# Patient Record
Sex: Male | Born: 1980 | Race: Black or African American | Hispanic: No | Smoking: Current some day smoker
Health system: Southern US, Community
[De-identification: ages and names within clinical notes are randomized; demographics above are authoritative.]

## PROBLEM LIST (undated history)

## (undated) DIAGNOSIS — F1910 Other psychoactive substance abuse, uncomplicated: Secondary | ICD-10-CM

## (undated) DIAGNOSIS — I5020 Unspecified systolic (congestive) heart failure: Secondary | ICD-10-CM

## (undated) DIAGNOSIS — I2699 Other pulmonary embolism without acute cor pulmonale: Secondary | ICD-10-CM

## (undated) DIAGNOSIS — I509 Heart failure, unspecified: Secondary | ICD-10-CM

## (undated) DIAGNOSIS — B20 Human immunodeficiency virus [HIV] disease: Secondary | ICD-10-CM

## (undated) DIAGNOSIS — I10 Essential (primary) hypertension: Secondary | ICD-10-CM

## (undated) DIAGNOSIS — Z21 Asymptomatic human immunodeficiency virus [HIV] infection status: Secondary | ICD-10-CM

## (undated) HISTORY — PX: HERNIA REPAIR: SHX51

---

## 2018-09-13 DIAGNOSIS — I420 Dilated cardiomyopathy: Secondary | ICD-10-CM | POA: Insufficient documentation

## 2018-09-16 DIAGNOSIS — Z87891 Personal history of nicotine dependence: Secondary | ICD-10-CM | POA: Insufficient documentation

## 2018-09-17 DIAGNOSIS — B360 Pityriasis versicolor: Secondary | ICD-10-CM | POA: Insufficient documentation

## 2018-11-04 DIAGNOSIS — W34010A Accidental discharge of airgun, initial encounter: Secondary | ICD-10-CM | POA: Insufficient documentation

## 2018-11-30 DIAGNOSIS — B009 Herpesviral infection, unspecified: Secondary | ICD-10-CM | POA: Insufficient documentation

## 2019-01-04 DIAGNOSIS — R0789 Other chest pain: Secondary | ICD-10-CM | POA: Insufficient documentation

## 2019-06-20 ENCOUNTER — Emergency Department: Admission: EM | Admit: 2019-06-20 | Discharge: 2019-06-20 | Discharge status: Against Medical Advice | Payer: Self-pay

## 2020-02-29 ENCOUNTER — Other Ambulatory Visit: Payer: Self-pay

## 2020-02-29 ENCOUNTER — Emergency Department
Admission: EM | Admit: 2020-02-29 | Discharge: 2020-03-01 | Disposition: A | Discharge status: Home / Self Care | Payer: Medicaid Other | Attending: Student in an Organized Health Care Education/Training Program | Admitting: Student in an Organized Health Care Education/Training Program

## 2020-02-29 ENCOUNTER — Encounter: Payer: Self-pay | Admitting: Emergency Medicine

## 2020-02-29 DIAGNOSIS — R600 Localized edema: Secondary | ICD-10-CM | POA: Diagnosis present

## 2020-02-29 DIAGNOSIS — T783XXA Angioneurotic edema, initial encounter: Secondary | ICD-10-CM

## 2020-02-29 DIAGNOSIS — I10 Essential (primary) hypertension: Secondary | ICD-10-CM | POA: Diagnosis not present

## 2020-02-29 HISTORY — DX: Essential (primary) hypertension: I10

## 2020-02-29 MED ORDER — PREDNISONE 20 MG PO TABS: 40.0000 mg | ORAL_TABLET | Freq: Every day | ORAL | 0 refills | Status: AC

## 2020-02-29 MED ORDER — DIPHENHYDRAMINE HCL 50 MG/ML IJ SOLN
25.0000 mg | Freq: Once | INTRAMUSCULAR | Status: AC
  Administered 2020-02-29: 25 mg via INTRAVENOUS
  Filled 2020-02-29: qty 1

## 2020-02-29 MED ORDER — AMLODIPINE BESYLATE 5 MG PO TABS
5.0000 mg | ORAL_TABLET | Freq: Every day | ORAL | 0 refills | Status: DC
Start: 2020-02-29 — End: 2021-07-24

## 2020-02-29 MED ORDER — FAMOTIDINE IN NACL 20-0.9 MG/50ML-% IV SOLN
20.0000 mg | Freq: Once | INTRAVENOUS | Status: AC
  Administered 2020-02-29: 20 mg via INTRAVENOUS
  Filled 2020-02-29: qty 50

## 2020-02-29 MED ORDER — METHYLPREDNISOLONE SODIUM SUCC 125 MG IJ SOLR
125.0000 mg | Freq: Once | INTRAMUSCULAR | Status: AC
  Administered 2020-02-29: 125 mg via INTRAVENOUS
  Filled 2020-02-29: qty 2

## 2020-02-29 MED ORDER — EPINEPHRINE 0.3 MG/0.3ML IJ SOAJ
0.3000 mg | Freq: Once | INTRAMUSCULAR | Status: AC
  Administered 2020-02-29: 0.3 mg via INTRAMUSCULAR
  Filled 2020-02-29: qty 0.3

## 2020-02-29 NOTE — ED Triage Notes (Signed)
Patient ambulatory to triage with steady gait, without difficulty or distress noted, mask in place; pt reports swelling to upper lip; has been on lisinopril for HTN for last yr; denies rash/itching, no diff swallowing or breathing

## 2020-02-29 NOTE — ED Provider Notes (Signed)
[facility] Emergency Department Provider Note    First MD Initiated Contact with Patient 02/29/20 2013     (approximate)  I have reviewed the triage vital signs and the nursing notes.   HISTORY  Chief Complaint Allergic Reaction    HPI William Powers is a 39 y.o. male with a history of hypertension and CHF on lisinopril presents to the ER for swelling of his upper lip.  States that roughly 1 week ago had an area about the size of mosquito bite on the upper lip this swelled up for several hours and then went away.  States around 3:00 today noted more significant swelling feels like his lip is very tight.  Denies any shortness of breath or chest pain.  No trouble swallowing.  No change in phonation.  Denies any sore throat.  Never had this happen before.  Has been on lisinopril for many years.    Past Medical History:  Diagnosis Date  . Hypertension    No family history on file. History reviewed. No pertinent surgical history. There are no problems to display for this patient.     Prior to Admission medications   Medication Sig Start Date End Date Taking? Authorizing Provider  amLODipine (NORVASC) 5 MG tablet Take 1 tablet (5 mg total) by mouth daily for 14 days. 02/29/20 03/14/20  Willy Eddy, MD    Allergies Patient has no known allergies.    Social History Social History   Tobacco Use  . Smoking status: Never Smoker  . Smokeless tobacco: Never Used  Substance Use Topics  . Alcohol use: Not on file  . Drug use: Not on file    Review of Systems Patient denies headaches, rhinorrhea, blurry vision, numbness, shortness of breath, chest pain, edema, cough, abdominal pain, nausea, vomiting, diarrhea, dysuria, fevers, rashes or hallucinations unless otherwise stated above in HPI. ____________________________________________   PHYSICAL EXAM:  VITAL SIGNS: Vitals:   02/29/20 2200 02/29/20 2230  BP: 115/76 106/78  Pulse: 74 76  Resp:  (!) 24 (!) 24  Temp:    SpO2: 97% 99%    Constitutional: Alert and oriented.  Eyes: Conjunctivae are normal.  Head: Atraumatic. Nose: No congestion/rhinnorhea. Mouth/Throat: Mucous membranes are moist.  Mild angioedema of the upper lip.  No lower or oropharyngeal involvement.  No uvular edema. Neck: No stridor. Painless ROM.  Cardiovascular: Normal rate, regular rhythm. Grossly normal heart sounds.  Good peripheral circulation. Respiratory: Normal respiratory effort.  No retractions. Lungs CTAB. Gastrointestinal: Soft and nontender. No distention. No abdominal bruits. No CVA tenderness. Genitourinary:  Musculoskeletal: No lower extremity tenderness nor edema.  No joint effusions. Neurologic:  Normal speech and language. No gross focal neurologic deficits are appreciated. No facial droop Skin:  Skin is warm, dry and intact. No rash noted. Psychiatric: Mood and affect are normal. Speech and behavior are normal.  ____________________________________________   LABS (all labs ordered are listed, but only abnormal results are displayed)  No results found for this or any previous visit (from the past 24 hour(s)). ____________________________________________ ____________________________________________  RADIOLOGY   ____________________________________________   PROCEDURES  Procedure(s) performed:  Procedures    Critical Care performed: no ____________________________________________   INITIAL IMPRESSION / ASSESSMENT AND PLAN / ED COURSE  Pertinent labs & imaging results that were available during my care of the patient were reviewed by me and considered in my medical decision making (see chart for details).   DDX: Angioedema, urticaria, anaphylaxis, cellulitis  Shae Augello is a 39 y.o.  who presents to the ED with evidence of mild angioedema to the upper lip.  No airway involvement.  Patient nontoxic-appearing.  Symptoms started around 3 days of does not seem to be rapidly  progressing.  Does use ace inhibitor which I informed him he will need to discontinue.  Will temporize and observe in the ER.  Clinical Course as of Feb 28 2337  Wed Feb 29, 2020  2132 Patient reassessed.  No progression of edema.   [PR]  2333 Patient reassessed.  His angioedema has improved.  Is much softer.  No spread.  No lingual or posterior oropharynx involvement.  Patient feels like it is also improving.  Given the duration of so I do not feel that he requires any additional observation or hospitalization he otherwise feels well.  Discussed signs and symptoms which he should return to the ER.  Have discussed with the patient and available family all diagnostics and treatments performed thus far and all questions were answered to the best of my ability. The patient demonstrates understanding and agreement with plan.    [PR]    Clinical Course User Index [PR] Merlyn Lot, MD    The patient was evaluated in Emergency Department today for the symptoms described in the history of present illness. He/she was evaluated in the context of the global COVID-19 pandemic, which necessitated consideration that the patient might be at risk for infection with the SARS-CoV-2 virus that causes COVID-19. Institutional protocols and algorithms that pertain to the evaluation of patients at risk for COVID-19 are in a state of rapid change based on information released by regulatory bodies including the CDC and federal and state organizations. These policies and algorithms were followed during the patient's care in the ED.  As part of my medical decision making, I reviewed the following data within the [place] notes reviewed and incorporated, Labs reviewed, notes from prior ED visits and [place] Controlled Substance Database   ____________________________________________   FINAL CLINICAL IMPRESSION(S) / ED DIAGNOSES  Final diagnoses:  Angioedema, initial encounter      NEW  MEDICATIONS STARTED DURING THIS VISIT:  New Prescriptions   AMLODIPINE (NORVASC) 5 MG TABLET    Take 1 tablet (5 mg total) by mouth daily for 14 days.     Note:  This document was prepared using Dragon voice recognition software and may include unintentional dictation errors.    Merlyn Lot, MD 02/29/20 [phone]

## 2020-02-29 NOTE — Discharge Instructions (Addendum)
Please discontinue use of lisinopril.  Call you PCP or cardiology office tomorrow to discuss medication management going forward for your blood pressure

## 2020-02-29 NOTE — ED Notes (Signed)
MD at bedside. 

## 2021-05-18 ENCOUNTER — Emergency Department (HOSPITAL_COMMUNITY)
Admission: EM | Admit: 2021-05-18 | Discharge: 2021-05-19 | Disposition: A | Discharge status: Home / Self Care | Payer: Medicaid Other | Attending: Emergency Medicine | Admitting: Emergency Medicine

## 2021-05-18 ENCOUNTER — Other Ambulatory Visit: Payer: Self-pay

## 2021-05-18 DIAGNOSIS — R079 Chest pain, unspecified: Secondary | ICD-10-CM | POA: Diagnosis not present

## 2021-05-18 DIAGNOSIS — I509 Heart failure, unspecified: Secondary | ICD-10-CM | POA: Diagnosis not present

## 2021-05-18 DIAGNOSIS — R635 Abnormal weight gain: Secondary | ICD-10-CM | POA: Insufficient documentation

## 2021-05-18 DIAGNOSIS — Z5321 Procedure and treatment not carried out due to patient leaving prior to being seen by health care provider: Secondary | ICD-10-CM | POA: Insufficient documentation

## 2021-05-18 DIAGNOSIS — R229 Localized swelling, mass and lump, unspecified: Secondary | ICD-10-CM | POA: Insufficient documentation

## 2021-05-18 LAB — CBC WITH DIFFERENTIAL/PLATELET
Abs Immature Granulocytes: 0.01 10*3/uL (ref 0.00–0.07)
Basophils Absolute: 0 10*3/uL (ref 0.0–0.1)
Basophils Relative: 1 %
Eosinophils Absolute: 0.2 10*3/uL (ref 0.0–0.5)
Eosinophils Relative: 4 %
HCT: 45.4 % (ref 39.0–52.0)
Hemoglobin: 15.3 g/dL (ref 13.0–17.0)
Immature Granulocytes: 0 %
Lymphocytes Relative: 44 %
Lymphs Abs: 2.7 10*3/uL (ref 0.7–4.0)
MCH: 31.9 pg (ref 26.0–34.0)
MCHC: 33.7 g/dL (ref 30.0–36.0)
MCV: 94.8 fL (ref 80.0–100.0)
Monocytes Absolute: 0.5 10*3/uL (ref 0.1–1.0)
Monocytes Relative: 8 %
Neutro Abs: 2.6 10*3/uL (ref 1.7–7.7)
Neutrophils Relative %: 43 %
Platelets: 317 10*3/uL (ref 150–400)
RBC: 4.79 MIL/uL (ref 4.22–5.81)
RDW: 13.9 % (ref 11.5–15.5)
WBC: 6.1 10*3/uL (ref 4.0–10.5)
nRBC: 0 % (ref 0.0–0.2)

## 2021-05-18 NOTE — ED Triage Notes (Signed)
BIB EMS  Pt has had chest pain today  Has been out of his lasix for a few days but got it filled today and took it.  Endorses weight gain and swelling  Hx of CHF.

## 2021-05-18 NOTE — ED Provider Notes (Signed)
Emergency Medicine Provider Triage Evaluation Note  William Powers , a 40 y.o. male  was evaluated in triage.  Pt complains of left sided chest pain that has been ongoing all day but worse this evening.  Reports some SOB-- hx of CHF and ran out of lasix recently.  Weight is up approx 7 lbs from baseline.  He did get lasix refilled today and took this afternoon. No prior hx of MI.  No recent URI symptoms, no fever.  Review of Systems  Positive: Chest pain, SOB Negative: Fever, cough,  Physical Exam  BP 117/73   Pulse 96   Temp 98.8 F (37.1 C) (Oral)   Resp 18   SpO2 99%   Gen:   Awake, no distress   Resp:  Normal effort  MSK:   Moves extremities without difficulty  Other:    Medical Decision Making  Medically screening exam initiated at 11:39 PM.  Appropriate orders placed.  William Powers was informed that the remainder of the evaluation will be completed by another provider, this initial triage assessment does not replace that evaluation, and the importance of remaining in the ED until their evaluation is complete.     William Hatchet, PA-C 05/18/21 2346    William Crease, MD 05/19/21 [phone]

## 2021-05-19 ENCOUNTER — Emergency Department (HOSPITAL_COMMUNITY): Payer: Medicaid Other

## 2021-05-19 LAB — BRAIN NATRIURETIC PEPTIDE: B Natriuretic Peptide: 8.3 pg/mL (ref 0.0–100.0)

## 2021-05-19 LAB — TROPONIN I (HIGH SENSITIVITY)
Troponin I (High Sensitivity): 4 ng/L (ref <18)
Troponin I (High Sensitivity): 5 ng/L (ref <18)

## 2021-05-19 LAB — BASIC METABOLIC PANEL
Anion gap: 9 (ref 5–15)
BUN: 21 mg/dL — ABNORMAL HIGH (ref 6–20)
CO2: 22 mmol/L (ref 22–32)
Calcium: 8.7 mg/dL — ABNORMAL LOW (ref 8.9–10.3)
Chloride: 106 mmol/L (ref 98–111)
Creatinine, Ser: 1.32 mg/dL — ABNORMAL HIGH (ref 0.61–1.24)
GFR, Estimated: >60 mL/min (ref >60)
Glucose, Bld: 93 mg/dL (ref 70–99)
Potassium: 3.4 mmol/L — ABNORMAL LOW (ref 3.5–5.1)
Sodium: 137 mmol/L (ref 135–145)

## 2021-05-19 NOTE — ED Notes (Signed)
Called pt x3 for vitals with no response. Pulling OTF at this time.

## 2021-06-07 ENCOUNTER — Emergency Department (HOSPITAL_COMMUNITY): Payer: Medicaid Other

## 2021-06-07 ENCOUNTER — Encounter (HOSPITAL_COMMUNITY): Payer: Self-pay | Admitting: Emergency Medicine

## 2021-06-07 ENCOUNTER — Other Ambulatory Visit: Payer: Self-pay

## 2021-06-07 ENCOUNTER — Emergency Department (HOSPITAL_COMMUNITY)
Admission: EM | Admit: 2021-06-07 | Discharge: 2021-06-07 | Disposition: A | Discharge status: Home / Self Care | Payer: Medicaid Other | Attending: Emergency Medicine | Admitting: Emergency Medicine

## 2021-06-07 DIAGNOSIS — I110 Hypertensive heart disease with heart failure: Secondary | ICD-10-CM | POA: Diagnosis not present

## 2021-06-07 DIAGNOSIS — I5023 Acute on chronic systolic (congestive) heart failure: Secondary | ICD-10-CM | POA: Insufficient documentation

## 2021-06-07 DIAGNOSIS — R0602 Shortness of breath: Secondary | ICD-10-CM | POA: Diagnosis present

## 2021-06-07 HISTORY — DX: Heart failure, unspecified: I50.9

## 2021-06-07 LAB — CBC WITH DIFFERENTIAL/PLATELET
Abs Immature Granulocytes: 0.03 10*3/uL (ref 0.00–0.07)
Basophils Absolute: 0 10*3/uL (ref 0.0–0.1)
Basophils Relative: 1 %
Eosinophils Absolute: 0.3 10*3/uL (ref 0.0–0.5)
Eosinophils Relative: 4 %
HCT: 45.2 % (ref 39.0–52.0)
Hemoglobin: 15.3 g/dL (ref 13.0–17.0)
Immature Granulocytes: 0 %
Lymphocytes Relative: 40 %
Lymphs Abs: 2.9 10*3/uL (ref 0.7–4.0)
MCH: 32.1 pg (ref 26.0–34.0)
MCHC: 33.8 g/dL (ref 30.0–36.0)
MCV: 95 fL (ref 80.0–100.0)
Monocytes Absolute: 0.7 10*3/uL (ref 0.1–1.0)
Monocytes Relative: 9 %
Neutro Abs: 3.4 10*3/uL (ref 1.7–7.7)
Neutrophils Relative %: 46 %
Platelets: 325 10*3/uL (ref 150–400)
RBC: 4.76 MIL/uL (ref 4.22–5.81)
RDW: 13.6 % (ref 11.5–15.5)
WBC: 7.3 10*3/uL (ref 4.0–10.5)
nRBC: 0 % (ref 0.0–0.2)

## 2021-06-07 LAB — BASIC METABOLIC PANEL
Anion gap: 11 (ref 5–15)
BUN: 23 mg/dL — ABNORMAL HIGH (ref 6–20)
CO2: 20 mmol/L — ABNORMAL LOW (ref 22–32)
Calcium: 9.1 mg/dL (ref 8.9–10.3)
Chloride: 106 mmol/L (ref 98–111)
Creatinine, Ser: 1.46 mg/dL — ABNORMAL HIGH (ref 0.61–1.24)
GFR, Estimated: >60 mL/min (ref >60)
Glucose, Bld: 107 mg/dL — ABNORMAL HIGH (ref 70–99)
Potassium: 3.7 mmol/L (ref 3.5–5.1)
Sodium: 137 mmol/L (ref 135–145)

## 2021-06-07 LAB — TROPONIN I (HIGH SENSITIVITY): Troponin I (High Sensitivity): 6 ng/L (ref <18)

## 2021-06-07 LAB — BRAIN NATRIURETIC PEPTIDE: B Natriuretic Peptide: 6.6 pg/mL (ref 0.0–100.0)

## 2021-06-07 MED ORDER — FUROSEMIDE 20 MG PO TABS
120.0000 mg | ORAL_TABLET | Freq: Once | ORAL | Status: AC
  Administered 2021-06-07: 120 mg via ORAL
  Filled 2021-06-07: qty 6

## 2021-06-07 NOTE — ED Provider Notes (Signed)
[facility] EMERGENCY DEPARTMENT Provider Note   CSN: [id number] Arrival date & time: 06/07/21  0101     History Chief Complaint  Patient presents with   Shortness of Breath    William Powers is a 40 y.o. male.  40 yo M with a chief complaints of worsening edema to his legs and his abdomen.  Going on for a few days.  States compliance with his medications but feels like things have gotten worse.  He gets seen at [facility] for his heart failure.  Is on Lasix 60 mg.  The history is provided by the patient.  Shortness of Breath Severity:  Moderate Onset quality:  Gradual Duration:  2 days Timing:  Constant Progression:  Worsening Chronicity:  New Relieved by:  Nothing Worsened by:  Nothing Ineffective treatments:  None tried Associated symptoms: no abdominal pain, no chest pain, no fever, no headaches, no rash and no vomiting       Past Medical History:  Diagnosis Date   CHF (congestive heart failure) (HCC)    Hypertension     There are no problems to display for this patient.   History reviewed. No pertinent surgical history.     No family history on file.  Social History   Tobacco Use   Smoking status: Never   Smokeless tobacco: Never  Vaping Use   Vaping Use: Never used    Home Medications Prior to Admission medications   Medication Sig Start Date End Date Taking? Authorizing Provider  amLODipine (NORVASC) 5 MG tablet Take 1 tablet (5 mg total) by mouth daily for 14 days. 02/29/20 03/14/20  Willy Eddy, MD    Allergies    Bee venom, Lisinopril, and Sulfamethoxazole-trimethoprim  Review of Systems   Review of Systems  Constitutional:  Negative for chills and fever.  HENT:  Negative for congestion and facial swelling.   Eyes:  Negative for discharge and visual disturbance.  Respiratory:  Positive for shortness of breath.   Cardiovascular:  Positive for leg swelling. Negative for chest pain and palpitations.  Gastrointestinal:   Negative for abdominal pain, diarrhea and vomiting.       Abdominal swelling  Musculoskeletal:  Negative for arthralgias and myalgias.  Skin:  Negative for color change and rash.  Neurological:  Negative for tremors, syncope and headaches.  Psychiatric/Behavioral:  Negative for confusion and dysphoric mood.    Physical Exam Updated Vital Signs BP 112/66   Pulse 79   Temp 98.6 F (37 C) (Oral)   Resp 20   SpO2 95%   Physical Exam Vitals and nursing note reviewed.  Constitutional:      Appearance: He is well-developed.  HENT:     Head: Normocephalic and atraumatic.  Eyes:     Pupils: Pupils are equal, round, and reactive to light.  Neck:     Vascular: No JVD.  Cardiovascular:     Rate and Rhythm: Normal rate and regular rhythm.     Heart sounds: No murmur heard.   No friction rub. No gallop.  Pulmonary:     Effort: No respiratory distress.     Breath sounds: No wheezing.  Abdominal:     General: There is no distension.     Tenderness: There is no abdominal tenderness. There is no guarding or rebound.  Musculoskeletal:        General: Normal range of motion.     Cervical back: Normal range of motion and neck supple.     Comments: Trace edema  to bilateral lower extremities  Skin:    Coloration: Skin is not pale.     Findings: No rash.  Neurological:     Mental Status: He is alert and oriented to person, place, and time.  Psychiatric:        Behavior: Behavior normal.    ED Results / Procedures / Treatments   Labs (all labs ordered are listed, but only abnormal results are displayed) Labs Reviewed  BASIC METABOLIC PANEL - Abnormal; Notable for the following components:      Result Value   CO2 20 (*)    Glucose, Bld 107 (*)    BUN 23 (*)    Creatinine, Ser 1.46 (*)    All other components within normal limits  CBC WITH DIFFERENTIAL/PLATELET  BRAIN NATRIURETIC PEPTIDE  TROPONIN I (HIGH SENSITIVITY)  TROPONIN I (HIGH SENSITIVITY)    EKG EKG  Interpretation  Date/Time:  Friday June 07 2021 01:13:16 EDT Ventricular Rate:  90 PR Interval:  160 QRS Duration: 88 QT Interval:  360 QTC Calculation: 440 R Axis:   58 Text Interpretation: Normal sinus rhythm Normal ECG No significant change since last tracing Confirmed by Melene Plan [phone]) on 06/07/2021 3:09:13 AM  Radiology DG Chest 2 View  Result Date: 06/07/2021 CLINICAL DATA:  Shortness of breath EXAM: CHEST - 2 VIEW COMPARISON:  05/19/2021 FINDINGS: The heart size and mediastinal contours are within normal limits. Both lungs are clear. The visualized skeletal structures are unremarkable. IMPRESSION: No active cardiopulmonary disease. Electronically Signed   By: Alcide Clever M.D.   On: 06/07/2021 01:37    Procedures Procedures   Medications Ordered in ED Medications  furosemide (LASIX) tablet 120 mg (has no administration in time range)    ED Course  I have reviewed the triage vital signs and the nursing notes.  Pertinent labs & imaging results that were available during my care of the patient were reviewed by me and considered in my medical decision making (see chart for details).    MDM Rules/Calculators/A&P                           40 yo M with a chief complaints of shortness of breath and leg swelling.  Going on for couple days.  Feels like his Lasix is not working.  Patient is well-appearing and nontoxic has clear lung sounds for me no significant signs of edema.  BNP is normal chest x-ray viewed by me without focal infiltrate.  We will double his home dose of Lasix for a couple days.  Have him call his primary cardiologist.  3:10 AM:  I have discussed the diagnosis/risks/treatment options with the patient and believe the pt to be eligible for discharge home to follow-up with Cards. We also discussed returning to the ED immediately if new or worsening sx occur. We discussed the sx which are most concerning (e.g., sudden worsening pain, fever, inability to tolerate  by mouth) that necessitate immediate return. Medications administered to the patient during their visit and any new prescriptions provided to the patient are listed below.  Medications given during this visit Medications  furosemide (LASIX) tablet 120 mg (has no administration in time range)     The patient appears reasonably screen and/or stabilized for discharge and I doubt any other medical condition or other [facility] requiring further screening, evaluation, or treatment in the ED at this time prior to discharge.   Final Clinical Impression(s) / ED Diagnoses Final diagnoses:  Acute on chronic systolic congestive heart failure [facility])    Rx / DC Orders ED Discharge Orders     None        Melene Plan, DO 06/07/21 0310

## 2021-06-07 NOTE — ED Provider Notes (Signed)
Emergency Medicine Provider Triage Evaluation Note  William Powers , a 40 y.o. male  was evaluated in triage.  Pt complains of CHF exacerbation.  States he takes 60mg  lasix daily but does not feel like it is working anymore.  Has had some increased LE swelling and around his abdomen.  Some mild SOB.  Denies chest pain.  Followed by [facility]ology at [facility].  Last EF was around 15% in 2019.  Review of Systems  Positive: LE swelling, weight gain Negative: Chest pain, fever  Physical Exam  BP (!) 117/93 (BP Location: Right Arm)   Pulse 93   Temp 98.4 F (36.9 C) (Oral)   Resp 16   SpO2 96%  Gen:   Awake, no distress   Resp:  Normal effort MSK:   Moves extremities without difficulty  Other:  Trace edema BLE, grossly symmetric  Medical Decision Making  Medically screening exam initiated at 1:15 AM.  Appropriate orders placed.  William Powers was informed that the remainder of the evaluation will be completed by another provider, this initial triage assessment does not replace that evaluation, and the importance of remaining in the ED until their evaluation is complete.  Cardiac work-up initiated.    Made acuity 2, will prioritize room assignment.   [name], PA-C 06/07/21 0120    06/09/21, MD 06/07/21 [phone]

## 2021-06-07 NOTE — Discharge Instructions (Addendum)
Double your Lasix for the next couple days.  Please discuss this with your cardiologist as they likely will need to recheck your blood work in about a week.  Please return to the emergency department for worsening difficulty breathing.

## 2021-06-07 NOTE — ED Triage Notes (Signed)
Patient here with shortness of breath, states that he feels like he has been retaining more fluid.  He takes Lasix and has been taking his dosage, but doesn't feel like it is enough.  Patient does have a weight gain per patient.  No chest pain.

## 2021-06-08 ENCOUNTER — Other Ambulatory Visit: Payer: Self-pay

## 2021-06-08 ENCOUNTER — Emergency Department (HOSPITAL_COMMUNITY)
Admission: EM | Admit: 2021-06-08 | Discharge: 2021-06-08 | Disposition: A | Discharge status: Home / Self Care | Payer: Medicaid Other | Attending: Emergency Medicine | Admitting: Emergency Medicine

## 2021-06-08 ENCOUNTER — Emergency Department (HOSPITAL_COMMUNITY): Payer: Medicaid Other

## 2021-06-08 DIAGNOSIS — I110 Hypertensive heart disease with heart failure: Secondary | ICD-10-CM | POA: Insufficient documentation

## 2021-06-08 DIAGNOSIS — Z79899 Other long term (current) drug therapy: Secondary | ICD-10-CM | POA: Diagnosis not present

## 2021-06-08 DIAGNOSIS — R32 Unspecified urinary incontinence: Secondary | ICD-10-CM

## 2021-06-08 DIAGNOSIS — R1909 Other intra-abdominal and pelvic swelling, mass and lump: Secondary | ICD-10-CM | POA: Insufficient documentation

## 2021-06-08 DIAGNOSIS — I5023 Acute on chronic systolic (congestive) heart failure: Secondary | ICD-10-CM

## 2021-06-08 DIAGNOSIS — R0602 Shortness of breath: Secondary | ICD-10-CM | POA: Diagnosis present

## 2021-06-08 LAB — COMPREHENSIVE METABOLIC PANEL
ALT: 31 U/L (ref 0–44)
AST: 52 U/L — ABNORMAL HIGH (ref 15–41)
Albumin: 4.4 g/dL (ref 3.5–5.0)
Alkaline Phosphatase: 47 U/L (ref 38–126)
Anion gap: 11 (ref 5–15)
BUN: 23 mg/dL — ABNORMAL HIGH (ref 6–20)
CO2: 21 mmol/L — ABNORMAL LOW (ref 22–32)
Calcium: 9.4 mg/dL (ref 8.9–10.3)
Chloride: 103 mmol/L (ref 98–111)
Creatinine, Ser: 1.28 mg/dL — ABNORMAL HIGH (ref 0.61–1.24)
GFR, Estimated: >60 mL/min (ref >60)
Glucose, Bld: 94 mg/dL (ref 70–99)
Potassium: 6 mmol/L — ABNORMAL HIGH (ref 3.5–5.1)
Sodium: 135 mmol/L (ref 135–145)
Total Bilirubin: 1.3 mg/dL — ABNORMAL HIGH (ref 0.3–1.2)
Total Protein: 8.5 g/dL — ABNORMAL HIGH (ref 6.5–8.1)

## 2021-06-08 LAB — TROPONIN I (HIGH SENSITIVITY)
Troponin I (High Sensitivity): 3 ng/L (ref <18)
Troponin I (High Sensitivity): 3 ng/L (ref <18)

## 2021-06-08 LAB — CBC WITH DIFFERENTIAL/PLATELET
Abs Immature Granulocytes: 0.04 10*3/uL (ref 0.00–0.07)
Basophils Absolute: 0.1 10*3/uL (ref 0.0–0.1)
Basophils Relative: 1 %
Eosinophils Absolute: 0.3 10*3/uL (ref 0.0–0.5)
Eosinophils Relative: 4 %
HCT: 44.4 % (ref 39.0–52.0)
Hemoglobin: 15.1 g/dL (ref 13.0–17.0)
Immature Granulocytes: 1 %
Lymphocytes Relative: 35 %
Lymphs Abs: 3 10*3/uL (ref 0.7–4.0)
MCH: 32.3 pg (ref 26.0–34.0)
MCHC: 34 g/dL (ref 30.0–36.0)
MCV: 94.9 fL (ref 80.0–100.0)
Monocytes Absolute: 0.8 10*3/uL (ref 0.1–1.0)
Monocytes Relative: 10 %
Neutro Abs: 4.3 10*3/uL (ref 1.7–7.7)
Neutrophils Relative %: 49 %
Platelets: 313 10*3/uL (ref 150–400)
RBC: 4.68 MIL/uL (ref 4.22–5.81)
RDW: 14 % (ref 11.5–15.5)
WBC: 8.6 10*3/uL (ref 4.0–10.5)
nRBC: 0 % (ref 0.0–0.2)

## 2021-06-08 LAB — BRAIN NATRIURETIC PEPTIDE: B Natriuretic Peptide: 10 pg/mL (ref 0.0–100.0)

## 2021-06-08 LAB — D-DIMER, QUANTITATIVE: D-Dimer, Quant: <0.27 ug/mL-FEU (ref 0.00–0.50)

## 2021-06-08 LAB — POTASSIUM: Potassium: 4.1 mmol/L (ref 3.5–5.1)

## 2021-06-08 MED ORDER — FUROSEMIDE 10 MG/ML IJ SOLN
60.0000 mg | Freq: Once | INTRAMUSCULAR | Status: DC
  Filled 2021-06-08: qty 8

## 2021-06-08 MED ORDER — ASPIRIN 81 MG PO CHEW
324.0000 mg | CHEWABLE_TABLET | Freq: Once | ORAL | Status: DC
  Filled 2021-06-08: qty 4

## 2021-06-08 NOTE — ED Triage Notes (Addendum)
Patient came in by EMS with complaints of SOB. No other symptoms just having SOB. [place] [facility] [place]

## 2021-06-08 NOTE — ED Notes (Signed)
Pt. Documented in error see above note in chart. 

## 2021-06-08 NOTE — ED Provider Notes (Addendum)
Physical Exam  BP (!) 142/92 (BP Location: Right Arm)   Pulse 83   Temp 98.7 F (37.1 C) (Oral)   Resp 17   Ht 6\' 4"  (1.93 m)   Wt 117.9 kg   SpO2 98%   BMI 31.65 kg/m   Physical Exam Vitals and nursing note reviewed. Exam conducted with a chaperone present.  Constitutional:      General: He is not in acute distress. HENT:     Head: Normocephalic and atraumatic.  Eyes:     Conjunctiva/sclera: Conjunctivae normal.     Pupils: Pupils are equal, round, and reactive to light.  Cardiovascular:     Rate and Rhythm: Normal rate and regular rhythm.  Pulmonary:     Effort: Pulmonary effort is normal. No respiratory distress.  Abdominal:     General: There is no distension.     Tenderness: There is no guarding.  Genitourinary:    Testes: Normal. Cremasteric reflex is present.     Rectum: Normal anal tone.  Musculoskeletal:        General: No deformity or signs of injury.     Cervical back: Normal range of motion and neck supple.     Comments: No midline tenderness palpation of the cervical, thoracic or lumbar spine.  No step-offs or deformities  Skin:    Findings: No lesion or rash.  Neurological:     General: No focal deficit present.     Mental Status: He is alert. Mental status is at baseline.     GCS: GCS eye subscore is 4. GCS verbal subscore is 5. GCS motor subscore is 6.     Cranial Nerves: Cranial nerves are intact.     Sensory: Sensation is intact.     Motor: Motor function is intact.     Coordination: Coordination is intact.     Gait: Gait is intact.    ED Course/Procedures     Procedures  MDM    40 year old male with a reported hx of CHF (last EF apparently 15% in 2019), follows with [facility], HIV, presenting with shortness of breath and abdominal swelling. Seen yesterday for similar symptoms.   Patient workup thus far has been reassuring. Delta troponins resulted negative, d-dimer negative, potassium initially hemolyzed, normal on recheck. On exam, pt lungs  were CTAB. He was resting comfortably on room air without an O2 requirement. CXR without acute findings. Pt previously refused IV Lasix in the ED. He was encouraged to continue taking his home PO Lasix and to follow-up with his cardiologist. No cough, fever or chills to suggest infection. No leukocytosis or anemia. BNP normal.   On reassessment, the patient additionally complaining of increasing difficulty over the past few months initiating a urinary stream. He complains of mild chronic low back pain without radiculopathy.  The patient is able to ambulate and is hemodynamically stable. There are no red flag symptoms. Specifically, he denies: -Being on an anticoagulant or blood thinner -Using IV drugs -Having a history of AAA -Having saddle anesthesia -Having urinary or fecal incontinence -Having any recent falls or trauma  Differential Diagnoses: I do not think that 2020 is experiencing cauda equina syndrome, abdominal aortic aneurysm, epidural abscess, aortic dissection, spinal hematoma, nephrolithiasis, spinal metastasis, discitis, or an acute fracture.  Rectal exam revealed normal normal tone with intact perineal sensation. The patient is able to ambulate. He was able to urinate with no concern for urinary obstruction. He has no neurologic deficit on exam and no midline  tenderness of his spine. Will hold on imaging at this time.  Given the patient's reassuring presentation, I believe that he is safe for discharge.  I provided ED return precautions, specifically for the symptoms which are most concerning (e.g., saddle anesthesia, urinary or bowel incontinence or retention, changing or worsening pain), which would necessitate immediate return.   He was advised to follow-up with his cardiologist and a referral was placed to Urology for follow-up outpatient.    Ernie Avena, MD 06/09/21 Jesusita Oka    Ernie Avena, MD 06/09/21 Jesusita Oka    Ernie Avena, MD 06/09/21 [phone]

## 2021-06-08 NOTE — ED Notes (Signed)
Patient has refused all medical assistance. He refused all medication, he refused lab work, and he has changed why he is here several time. Patient had a previous wound on hand and is saying its swollen and refused to let staff draw blood.

## 2021-06-08 NOTE — ED Notes (Addendum)
Patient sleeping soundly when RN entered room, O2 resting 100% RA. Ambulated in room with pulse ox, O2 100% RA. Steady gait. Given PO food and fluids.

## 2021-06-08 NOTE — Discharge Instructions (Addendum)
Take your Lasix as prescribed and follow-up with your cardiologist. You should come back to the emergency department with worsening difficulty breathing, chest pain or any other concerns. A referral has been placed to urology for evaluation of your urinary symptoms outpatient.

## 2021-06-08 NOTE — ED Provider Notes (Signed)
[facility]-EMERGENCY DEPT Provider Note   CSN: [id number] Arrival date & time: 06/08/21  1660     History No chief complaint on file.   William Powers is a 40 y.o. male.  Patient with history of reduced ejection heart failure (last ER 15% in 2019 by report), hypertension, HIV here with increasing shortness of breath, abdominal swelling and chest tightness.  Seen at [facility] yesterday for similar symptoms.  He reports compliance with 60 mg of Lasix daily.  He was given 120 mg of Lasix at [facility] yesterday without improvement.  Reports increasing shortness of breath and still feels tight in his chest and no better than yesterday.  Feels like his legs are swollen.  No cough or fever.  Some difficulty lying flat which is not abnormal for him. States he is still taking his Lasix at home but is not sure of the dose. Denies any dizziness or lightheadedness.  No fever cough  The history is provided by the patient.      Past Medical History:  Diagnosis Date   CHF (congestive heart failure) (HCC)    Hypertension     There are no problems to display for this patient.   No past surgical history on file.     No family history on file.  Social History   Tobacco Use   Smoking status: Never   Smokeless tobacco: Never  Vaping Use   Vaping Use: Never used    Home Medications Prior to Admission medications   Medication Sig Start Date End Date Taking? Authorizing Provider  amLODipine (NORVASC) 5 MG tablet Take 1 tablet (5 mg total) by mouth daily for 14 days. 02/29/20 03/14/20  Willy Eddy, MD    Allergies    Bee venom, Lisinopril, and Sulfamethoxazole-trimethoprim  Review of Systems   Review of Systems  Constitutional:  Negative for activity change, appetite change and fever.  HENT:  Negative for congestion and rhinorrhea.   Eyes:  Negative for visual disturbance.  Respiratory:  Positive for chest tightness and shortness of breath.    Cardiovascular:  Positive for chest pain. Negative for leg swelling.  Gastrointestinal:  Positive for abdominal distention. Negative for abdominal pain, nausea and vomiting.  Genitourinary:  Negative for dysuria and hematuria.  Musculoskeletal:  Negative for arthralgias and myalgias.  Skin:  Negative for rash.  Neurological:  Negative for dizziness, weakness and headaches.   all other systems are negative except as noted in the HPI and PMH.   Physical Exam Updated Vital Signs BP 109/64 (BP Location: Left Arm)   Pulse 84   Temp 98.7 F (37.1 C) (Oral)   Resp 18   Ht 6\' 4"  (1.93 m)   Wt 117.9 kg   SpO2 95%   BMI 31.65 kg/m   Physical Exam Vitals and nursing note reviewed.  Constitutional:      General: He is not in acute distress.    Appearance: He is well-developed.  HENT:     Head: Normocephalic and atraumatic.     Mouth/Throat:     Pharynx: No oropharyngeal exudate.  Eyes:     Conjunctiva/sclera: Conjunctivae normal.     Pupils: Pupils are equal, round, and reactive to light.  Neck:     Comments: No meningismus. Cardiovascular:     Rate and Rhythm: Normal rate and regular rhythm.     Heart sounds: Normal heart sounds. No murmur heard. Pulmonary:     Effort: Pulmonary effort is normal. No respiratory distress.  Breath sounds: Normal breath sounds.     Comments: Diminished bilaterally Chest:     Chest wall: No tenderness.  Abdominal:     Palpations: Abdomen is soft.     Tenderness: There is no abdominal tenderness. There is no guarding or rebound.  Musculoskeletal:        General: No tenderness. Normal range of motion.     Cervical back: Normal range of motion and neck supple.     Right lower leg: No edema.     Left lower leg: No edema.  Skin:    General: Skin is warm.  Neurological:     Mental Status: He is alert and oriented to person, place, and time.     Cranial Nerves: No cranial nerve deficit.     Motor: No abnormal muscle tone.     Coordination:  Coordination normal.     Comments:  5/5 strength throughout. CN 2-12 intact.Equal grip strength.   Psychiatric:        Behavior: Behavior normal.    ED Results / Procedures / Treatments   Labs (all labs ordered are listed, but only abnormal results are displayed) Labs Reviewed  COMPREHENSIVE METABOLIC PANEL - Abnormal; Notable for the following components:      Result Value   Potassium 6.0 (*)    CO2 21 (*)    BUN 23 (*)    Creatinine, Ser 1.28 (*)    Total Protein 8.5 (*)    AST 52 (*)    Total Bilirubin 1.3 (*)    All other components within normal limits  CBC WITH DIFFERENTIAL/PLATELET  BRAIN NATRIURETIC PEPTIDE  D-DIMER, QUANTITATIVE  POTASSIUM  TROPONIN I (HIGH SENSITIVITY)  TROPONIN I (HIGH SENSITIVITY)    EKG None  Radiology DG Chest 2 View  Result Date: 06/07/2021 CLINICAL DATA:  Shortness of breath EXAM: CHEST - 2 VIEW COMPARISON:  05/19/2021 FINDINGS: The heart size and mediastinal contours are within normal limits. Both lungs are clear. The visualized skeletal structures are unremarkable. IMPRESSION: No active cardiopulmonary disease. Electronically Signed   By: Alcide Clever M.D.   On: 06/07/2021 01:37   DG Chest Portable 1 View  Result Date: 06/08/2021 CLINICAL DATA:  Shortness of breath. EXAM: PORTABLE CHEST 1 VIEW COMPARISON:  06/07/2021 FINDINGS: The heart size and mediastinal contours are within normal limits. Both lungs are clear. The visualized skeletal structures are unremarkable. IMPRESSION: No active disease. Electronically Signed   By: Burman Nieves M.D.   On: 06/08/2021 01:39    Procedures Procedures   Medications Ordered in ED Medications  aspirin chewable tablet 324 mg (has no administration in time range)  furosemide (LASIX) injection 60 mg (has no administration in time range)    ED Course  I have reviewed the triage vital signs and the nursing notes.  Pertinent labs & imaging results that were available during my care of the patient  were reviewed by me and considered in my medical decision making (see chart for details).    MDM Rules/Calculators/A&P                           Heart failure with reduced ejection fraction here with increasing shortness of breath and chest tightness.  EKG is unchanged.  Lungs are clear, no increased work of breathing and no hypoxia.  Chest x-ray is negative.  No hypoxia or increased work of breathing.  Patient belligerent and states no one is respecting him because he is not  feeling any better. He has declined IV Lasix stating he does not "does not want any liquid Lasix as it does not work". Patient becomes upset and argumentative when told that his oxygen level is 100% his lungs and chest x-ray are clear.  He states "Why is no one caring about me?"  Assured patient that his work-up is in progress.  We are looking for other explanations for his subjective shortness of breath.  He does not appear to be overtly volume overloaded.  BNP is 10. D-dimer is negative.  Low suspicion for pulmonary embolism.  Initial troponin is negative.  Potassium was 6.0, likely hemolyzed.  Will recheck.  Awaiting second troponin.  D-dimer is negative.  Patient feels improved.  He does not appear to have significant fluid overload.  Advised to continue his IV diuretics and follow-up with his cardiologist. No evidence of pneumonia, pulmonary embolism or other explanation for his dyspnea.  Awaiting repeat troponin, potassium as well as ambulation trial prior to discharge.  Care to be transferred at shift change. Dr. Karene Fry to assume care.  He had ED ECG REPORT   Date: 06/08/2021  Rate: 88  Rhythm: normal sinus rhythm  QRS Axis: normal  Intervals: normal  ST/T Wave abnormalities: normal  Conduction Disutrbances:none  Narrative Interpretation:   Old EKG Reviewed: none available and unchanged  I have personally reviewed the EKG tracing and agree with the computerized printout as noted.  Final Clinical  Impression(s) / ED Diagnoses Final diagnoses:  Acute on chronic systolic CHF (congestive heart failure) (HCC)    Rx / DC Orders ED Discharge Orders     None        Kessler Solly, Jeannett Senior, MD 06/08/21 [phone]

## 2021-07-16 ENCOUNTER — Encounter (HOSPITAL_COMMUNITY): Payer: Self-pay | Admitting: Emergency Medicine

## 2021-07-16 ENCOUNTER — Emergency Department (HOSPITAL_COMMUNITY)
Admission: EM | Admit: 2021-07-16 | Discharge: 2021-07-16 | Disposition: A | Discharge status: Home / Self Care | Payer: Medicaid Other | Attending: Emergency Medicine | Admitting: Emergency Medicine

## 2021-07-16 ENCOUNTER — Other Ambulatory Visit: Payer: Self-pay

## 2021-07-16 ENCOUNTER — Emergency Department (HOSPITAL_COMMUNITY): Payer: Medicaid Other

## 2021-07-16 DIAGNOSIS — R300 Dysuria: Secondary | ICD-10-CM | POA: Diagnosis not present

## 2021-07-16 DIAGNOSIS — R000 Tachycardia, unspecified: Secondary | ICD-10-CM | POA: Insufficient documentation

## 2021-07-16 DIAGNOSIS — R3129 Other microscopic hematuria: Secondary | ICD-10-CM | POA: Diagnosis not present

## 2021-07-16 DIAGNOSIS — I509 Heart failure, unspecified: Secondary | ICD-10-CM | POA: Diagnosis not present

## 2021-07-16 DIAGNOSIS — I110 Hypertensive heart disease with heart failure: Secondary | ICD-10-CM | POA: Diagnosis not present

## 2021-07-16 DIAGNOSIS — R079 Chest pain, unspecified: Secondary | ICD-10-CM | POA: Diagnosis not present

## 2021-07-16 DIAGNOSIS — R0789 Other chest pain: Secondary | ICD-10-CM

## 2021-07-16 DIAGNOSIS — R350 Frequency of micturition: Secondary | ICD-10-CM

## 2021-07-16 LAB — COMPREHENSIVE METABOLIC PANEL
ALT: 33 U/L (ref 0–44)
AST: 37 U/L (ref 15–41)
Albumin: 4.4 g/dL (ref 3.5–5.0)
Alkaline Phosphatase: 45 U/L (ref 38–126)
Anion gap: 10 (ref 5–15)
BUN: 19 mg/dL (ref 6–20)
CO2: 25 mmol/L (ref 22–32)
Calcium: 9.5 mg/dL (ref 8.9–10.3)
Chloride: 107 mmol/L (ref 98–111)
Creatinine, Ser: 1.3 mg/dL — ABNORMAL HIGH (ref 0.61–1.24)
GFR, Estimated: >60 mL/min (ref >60)
Glucose, Bld: 96 mg/dL (ref 70–99)
Potassium: 4.2 mmol/L (ref 3.5–5.1)
Sodium: 142 mmol/L (ref 135–145)
Total Bilirubin: 0.5 mg/dL (ref 0.3–1.2)
Total Protein: 8.4 g/dL — ABNORMAL HIGH (ref 6.5–8.1)

## 2021-07-16 LAB — URINALYSIS, ROUTINE W REFLEX MICROSCOPIC
Bilirubin Urine: NEGATIVE
Glucose, UA: NEGATIVE mg/dL
Ketones, ur: NEGATIVE mg/dL
Leukocytes,Ua: NEGATIVE
Nitrite: NEGATIVE
Protein, ur: NEGATIVE mg/dL
Specific Gravity, Urine: 1.02 (ref 1.005–1.030)
pH: 6 (ref 5.0–8.0)

## 2021-07-16 LAB — CBC WITH DIFFERENTIAL/PLATELET
Abs Immature Granulocytes: 0.02 10*3/uL (ref 0.00–0.07)
Basophils Absolute: 0.1 10*3/uL (ref 0.0–0.1)
Basophils Relative: 1 %
Eosinophils Absolute: 0.1 10*3/uL (ref 0.0–0.5)
Eosinophils Relative: 1 %
HCT: 41.7 % (ref 39.0–52.0)
Hemoglobin: 14 g/dL (ref 13.0–17.0)
Immature Granulocytes: 0 %
Lymphocytes Relative: 29 %
Lymphs Abs: 1.6 10*3/uL (ref 0.7–4.0)
MCH: 32.3 pg (ref 26.0–34.0)
MCHC: 33.6 g/dL (ref 30.0–36.0)
MCV: 96.1 fL (ref 80.0–100.0)
Monocytes Absolute: 0.5 10*3/uL (ref 0.1–1.0)
Monocytes Relative: 9 %
Neutro Abs: 3.2 10*3/uL (ref 1.7–7.7)
Neutrophils Relative %: 60 %
Platelets: 308 10*3/uL (ref 150–400)
RBC: 4.34 MIL/uL (ref 4.22–5.81)
RDW: 13.6 % (ref 11.5–15.5)
WBC: 5.4 10*3/uL (ref 4.0–10.5)
nRBC: 0 % (ref 0.0–0.2)

## 2021-07-16 LAB — LIPASE, BLOOD: Lipase: 42 U/L (ref 11–51)

## 2021-07-16 LAB — TROPONIN I (HIGH SENSITIVITY)
Troponin I (High Sensitivity): 3 ng/L (ref <18)
Troponin I (High Sensitivity): 4 ng/L (ref <18)

## 2021-07-16 MED ORDER — MORPHINE SULFATE (PF) 4 MG/ML IV SOLN
4.0000 mg | Freq: Once | INTRAVENOUS | Status: DC
  Filled 2021-07-16: qty 1

## 2021-07-16 MED ORDER — IOHEXOL 350 MG/ML SOLN
80.0000 mL | Freq: Once | INTRAVENOUS | Status: AC | PRN
  Administered 2021-07-16: 80 mL via INTRAVENOUS

## 2021-07-16 MED ORDER — ACETAMINOPHEN 500 MG PO TABS
1000.0000 mg | ORAL_TABLET | Freq: Once | ORAL | Status: AC
  Administered 2021-07-16: 1000 mg via ORAL
  Filled 2021-07-16: qty 2

## 2021-07-16 MED ORDER — ONDANSETRON HCL 4 MG/2ML IJ SOLN
4.0000 mg | Freq: Once | INTRAMUSCULAR | Status: DC
  Filled 2021-07-16: qty 2

## 2021-07-16 NOTE — ED Triage Notes (Signed)
Patient is complaining of urinating and pooping at the same time when he has to urinate. Patient is concern.

## 2021-07-16 NOTE — ED Notes (Signed)
Pt at X-RAY will get vitals on return

## 2021-07-16 NOTE — ED Provider Notes (Signed)
[facility]-EMERG[facility] DEPT Provider Note   CSN: [id number] Arrival date & time: 07/16/21  0300     History Chief Complaint  Patient presents with   Urinary Frequency    William Powers is a 40 y.o. male.    Patient with history of CHF and hypertension presents with increased urinary frequency and dysuria.  Symptoms going on for 2 months, no associated abdominal tenderness.  He has not noticed any blood in the urine.  He is also complaining that he is having defecation whenever he urinates for the last 2 months.  Has not tried any alleviating factors, has not seen urologist for this problem.  No history of colonoscopy.  No history of kidney stones.  Patient also reports he is having chest pain that started acutely yesterday and has been constant.  No nausea or vomiting or shortness of breath, pain does not radiate anywhere else.  The pain is in the middle of his chest, sharp pain is about a 3 out of 10. Patient is followed by [facility] cardiology, states its been "a while" since he was last seen.    Past Medical History:  Diagnosis Date   CHF (congestive heart failure) (HCC)    Hypertension     There are no problems to display for this patient.   History reviewed. No pertinent surgical history.     History reviewed. No pertinent family history.  Social History   Tobacco Use   Smoking status: Never   Smokeless tobacco: Never  Vaping Use   Vaping Use: Never used    Home Medications Prior to Admission medications   Medication Sig Start Date End Date Taking? Authorizing Provider  amLODipine (NORVASC) 5 MG tablet Take 1 tablet (5 mg total) by mouth daily for 14 days. 02/29/20 03/14/20  Willy Eddy, MD    Allergies    Bee venom, Lisinopril, and Sulfamethoxazole-trimethoprim  Review of Systems   Review of Systems  Constitutional:  Negative for fatigue and fever.  Respiratory:  Negative for shortness of breath.   Cardiovascular:  Positive for chest  pain. Negative for leg swelling.  Gastrointestinal:  Negative for abdominal pain, blood in stool, nausea and vomiting.  Genitourinary:  Positive for dysuria and frequency. Negative for hematuria and scrotal swelling.   Physical Exam Updated Vital Signs BP 123/78 (BP Location: Left Arm)   Pulse 86   Temp 97.7 F (36.5 C) (Oral)   Resp 18   Ht 6\' 3"  (1.905 m)   Wt 115.7 kg   SpO2 100%   BMI 31.87 kg/m   Physical Exam Vitals and nursing note reviewed. Exam conducted with a chaperone present.  Constitutional:      Appearance: Normal appearance.  HENT:     Head: Normocephalic and atraumatic.  Eyes:     General: No scleral icterus.       Right eye: No discharge.        Left eye: No discharge.     Extraocular Movements: Extraocular movements intact.     Pupils: Pupils are equal, round, and reactive to light.  Cardiovascular:     Rate and Rhythm: Regular rhythm. Tachycardia present.     Pulses: Normal pulses.     Heart sounds: Normal heart sounds. No murmur heard.   No friction rub. No gallop.  Pulmonary:     Effort: Pulmonary effort is normal. No respiratory distress.     Breath sounds: Normal breath sounds.  Abdominal:     General: Abdomen is flat. Bowel  sounds are normal. There is no distension.     Palpations: Abdomen is soft.     Tenderness: There is no abdominal tenderness.     Comments: Abdomen is soft, nontender to palpation.  No rigidity or guarding.  Skin:    General: Skin is warm and dry.     Coloration: Skin is not jaundiced.  Neurological:     Mental Status: He is alert. Mental status is at baseline.     Coordination: Coordination normal.    ED Results / Procedures / Treatments   Labs (all labs ordered are listed, but only abnormal results are displayed) Labs Reviewed  COMPREHENSIVE METABOLIC PANEL - Abnormal; Notable for the following components:      Result Value   Creatinine, Ser 1.30 (*)    Total Protein 8.4 (*)    All other components within normal  limits  URINALYSIS, ROUTINE W REFLEX MICROSCOPIC - Abnormal; Notable for the following components:   Hgb urine dipstick SMALL (*)    Bacteria, UA RARE (*)    All other components within normal limits  URINE CULTURE  CBC WITH DIFFERENTIAL/PLATELET  LIPASE, BLOOD  TROPONIN I (HIGH SENSITIVITY)    EKG EKG Interpretation  Date/Time:  Tuesday July 16 2021 06:50:41 EDT Ventricular Rate:  84 PR Interval:  177 QRS Duration: 99 QT Interval:  364 QTC Calculation: 431 R Axis:   50 Text Interpretation: Sinus rhythm Consider left atrial enlargement ST elev, probable normal early repol pattern No significant change since prior 8/22 Confirmed by Meridee Score [phone]) on 07/16/2021 7:12:18 AM  Radiology DG Chest 2 View  Result Date: 07/16/2021 CLINICAL DATA:  Chest pain. EXAM: CHEST - 2 VIEW COMPARISON:  06/08/2021 FINDINGS: The cardiomediastinal silhouette is unchanged with normal heart size. The lungs are mildly hypoinflated. No airspace consolidation, edema, pleural effusion, pneumothorax is identified. No acute osseous abnormality is seen. IMPRESSION: No active cardiopulmonary disease. Electronically Signed   By: Sebastian Ache M.D.   On: 07/16/2021 07:13   CT Abdomen Pelvis W Contrast  Result Date: 07/16/2021 CLINICAL DATA:  Microhematuria with unknown cause EXAM: CT ABDOMEN AND PELVIS WITH CONTRAST TECHNIQUE: Multidetector CT imaging of the abdomen and pelvis was performed using the standard protocol following bolus administration of intravenous contrast. CONTRAST:  108mL OMNIPAQUE IOHEXOL 350 MG/ML SOLN COMPARISON:  None. FINDINGS: Lower chest:  Partially covered gynecomastia on the left. Hepatobiliary: No focal liver abnormality.No evidence of biliary obstruction or stone. Pancreas: Unremarkable. Spleen: Unremarkable. Adrenals/Urinary Tract: Negative adrenals. No hydronephrosis or stone. Patchy renal cortical scarring at the left lower pole. Unremarkable bladder. Stomach/Bowel:  No  obstruction. No appendicitis. Vascular/Lymphatic: No acute vascular abnormality. No mass or adenopathy. Reproductive:No pathologic findings. Other: No ascites or pneumoperitoneum. Shallow, fatty right inguinal hernia. Musculoskeletal: No acute abnormalities. IMPRESSION: 1. No specific cause for symptoms.  No mass or stone. 2. Patchy cortical scarring at the lower pole left kidney. Electronically Signed   By: Tiburcio Pea M.D.   On: 07/16/2021 07:42    Procedures Procedures   Medications Ordered in ED Medications  morphine 4 MG/ML injection 4 mg (has no administration in time range)  ondansetron (ZOFRAN) injection 4 mg (has no administration in time range)  iohexol (OMNIPAQUE) 350 MG/ML injection 80 mL (80 mLs Intravenous Contrast Given 07/16/21 0702)  acetaminophen (TYLENOL) tablet 1,000 mg (1,000 mg Oral Given 07/16/21 0754)    ED Course  I have reviewed the triage vital signs and the nursing notes.  Pertinent labs & imaging results that were  available during my care of the patient were reviewed by me and considered in my medical decision making (see chart for details).    MDM Rules/Calculators/A&P                           Patient is mildly tachycardic, no hypotension or fever.  Not sepsis.  Patient is complaining of increased urinary frequency, no signs of UTI on UA.  Trace amount of hemoglobin, kidney stone is on the differential but there is no CVA tenderness.  No abdominal tenderness, abdomen is soft so doubt acute abdomen.  Patient is also complaining of chest pain, given his history of heart failure proceed with chest pain labs and get a repeat chest x-ray to assess for cardiomegaly and possible pleural effusion.  Lungs were clear to auscultation, no hypoxia.  Given no tachycardia or hypoxia doubt this is a PE.  Will check troponin to assess for ACS.  Initial troponin negative, will check second troponin due to risk factors.  Radiographs not any evidence of pleural effusion or  changes in the cardiac silhouette concerning for advanced cardiomegaly.  Patient is not fluid overloaded, no edema in the lower extremities.  Doubt this is a CHF exacerbation.  CT abdomen unrevealing for any source of the abdominal pain.  No leukocytosis or anemia.  Lipase is not elevated, doubt pancreatitis.  No gross electrolyte derangement.  Vitals are stable, on reevaluation patient's pain has improved.  Doubt that the issues with increased urination with concurrent defecation is an acute neurologic process given this been ongoing for multiple months.  Will provide information for GI follow-up.  Final Clinical Impression(s) / ED Diagnoses Final diagnoses:  None    Rx / DC Orders ED Discharge Orders     None        Theron Arista, Cordelia Poche 07/16/21 1015    Terrilee Files, MD 07/16/21 [phone]

## 2021-07-16 NOTE — Discharge Instructions (Signed)
Take Tylenol as needed for pain. Schedule follow-up with a gastroenterologist to assess for defecation related issues.  I have also given you information for urologist for the urinary concerns, no signs of a UTI here in the ED setting.  We did get a culture and if that grows any bacteria you will get a call from the hospital. Schedule follow-up with your primary care doctor as needed.  Also schedule follow-up with your cardiologist given it has been a long time since her last seen and you may need an echo.

## 2021-07-16 NOTE — ED Notes (Signed)
Labeled specimen cup provided to pt for urine collection per MD order. ENMiles 

## 2021-07-17 LAB — URINE CULTURE: Culture: 30000 — AB

## 2021-07-22 ENCOUNTER — Other Ambulatory Visit: Payer: Self-pay

## 2021-07-22 ENCOUNTER — Observation Stay (HOSPITAL_COMMUNITY)
Admission: EM | Admit: 2021-07-22 | Discharge: 2021-07-24 | Disposition: A | Discharge status: Home / Self Care | Payer: Medicaid Other | Attending: Internal Medicine | Admitting: Internal Medicine

## 2021-07-22 DIAGNOSIS — I110 Hypertensive heart disease with heart failure: Secondary | ICD-10-CM | POA: Insufficient documentation

## 2021-07-22 DIAGNOSIS — Z79899 Other long term (current) drug therapy: Secondary | ICD-10-CM | POA: Diagnosis not present

## 2021-07-22 DIAGNOSIS — Z87891 Personal history of nicotine dependence: Secondary | ICD-10-CM | POA: Diagnosis not present

## 2021-07-22 DIAGNOSIS — I5022 Chronic systolic (congestive) heart failure: Secondary | ICD-10-CM | POA: Insufficient documentation

## 2021-07-22 DIAGNOSIS — I2699 Other pulmonary embolism without acute cor pulmonale: Principal | ICD-10-CM | POA: Insufficient documentation

## 2021-07-22 DIAGNOSIS — B20 Human immunodeficiency virus [HIV] disease: Secondary | ICD-10-CM | POA: Diagnosis not present

## 2021-07-22 DIAGNOSIS — R0602 Shortness of breath: Secondary | ICD-10-CM | POA: Diagnosis present

## 2021-07-22 DIAGNOSIS — Z20822 Contact with and (suspected) exposure to covid-19: Secondary | ICD-10-CM | POA: Insufficient documentation

## 2021-07-22 DIAGNOSIS — I2693 Single subsegmental pulmonary embolism without acute cor pulmonale: Secondary | ICD-10-CM | POA: Diagnosis present

## 2021-07-22 HISTORY — DX: Asymptomatic human immunodeficiency virus (hiv) infection status: Z21

## 2021-07-22 HISTORY — DX: Human immunodeficiency virus (HIV) disease: B20

## 2021-07-22 HISTORY — DX: Unspecified systolic (congestive) heart failure: I50.20

## 2021-07-22 HISTORY — DX: Other psychoactive substance abuse, uncomplicated: F19.10

## 2021-07-23 ENCOUNTER — Emergency Department (HOSPITAL_COMMUNITY): Payer: Medicaid Other

## 2021-07-23 ENCOUNTER — Observation Stay (HOSPITAL_BASED_OUTPATIENT_CLINIC_OR_DEPARTMENT_OTHER): Payer: Medicaid Other

## 2021-07-23 ENCOUNTER — Encounter (HOSPITAL_COMMUNITY): Payer: Self-pay

## 2021-07-23 DIAGNOSIS — B20 Human immunodeficiency virus [HIV] disease: Secondary | ICD-10-CM

## 2021-07-23 DIAGNOSIS — I2693 Single subsegmental pulmonary embolism without acute cor pulmonale: Secondary | ICD-10-CM | POA: Diagnosis not present

## 2021-07-23 DIAGNOSIS — I5022 Chronic systolic (congestive) heart failure: Secondary | ICD-10-CM

## 2021-07-23 DIAGNOSIS — I5031 Acute diastolic (congestive) heart failure: Secondary | ICD-10-CM

## 2021-07-23 DIAGNOSIS — I2782 Chronic pulmonary embolism: Secondary | ICD-10-CM

## 2021-07-23 HISTORY — DX: Single subsegmental thrombotic pulmonary embolism without acute cor pulmonale: I26.93

## 2021-07-23 LAB — CBC WITH DIFFERENTIAL/PLATELET
Abs Immature Granulocytes: 0.01 10*3/uL (ref 0.00–0.07)
Basophils Absolute: 0 10*3/uL (ref 0.0–0.1)
Basophils Relative: 0 %
Eosinophils Absolute: 0.3 10*3/uL (ref 0.0–0.5)
Eosinophils Relative: 3 %
HCT: 40.5 % (ref 39.0–52.0)
Hemoglobin: 13.6 g/dL (ref 13.0–17.0)
Immature Granulocytes: 0 %
Lymphocytes Relative: 36 %
Lymphs Abs: 2.6 10*3/uL (ref 0.7–4.0)
MCH: 32.8 pg (ref 26.0–34.0)
MCHC: 33.6 g/dL (ref 30.0–36.0)
MCV: 97.6 fL (ref 80.0–100.0)
Monocytes Absolute: 0.6 10*3/uL (ref 0.1–1.0)
Monocytes Relative: 9 %
Neutro Abs: 3.7 10*3/uL (ref 1.7–7.7)
Neutrophils Relative %: 52 %
Platelets: 299 10*3/uL (ref 150–400)
RBC: 4.15 MIL/uL — ABNORMAL LOW (ref 4.22–5.81)
RDW: 13.5 % (ref 11.5–15.5)
WBC: 7.3 10*3/uL (ref 4.0–10.5)
nRBC: 0 % (ref 0.0–0.2)

## 2021-07-23 LAB — COMPREHENSIVE METABOLIC PANEL
ALT: 27 U/L (ref 0–44)
AST: 33 U/L (ref 15–41)
Albumin: 4.2 g/dL (ref 3.5–5.0)
Alkaline Phosphatase: 45 U/L (ref 38–126)
Anion gap: 7 (ref 5–15)
BUN: 14 mg/dL (ref 6–20)
CO2: 26 mmol/L (ref 22–32)
Calcium: 9.2 mg/dL (ref 8.9–10.3)
Chloride: 108 mmol/L (ref 98–111)
Creatinine, Ser: 1.39 mg/dL — ABNORMAL HIGH (ref 0.61–1.24)
GFR, Estimated: >60 mL/min (ref >60)
Glucose, Bld: 100 mg/dL — ABNORMAL HIGH (ref 70–99)
Potassium: 3.5 mmol/L (ref 3.5–5.1)
Sodium: 141 mmol/L (ref 135–145)
Total Bilirubin: 0.6 mg/dL (ref 0.3–1.2)
Total Protein: 7.9 g/dL (ref 6.5–8.1)

## 2021-07-23 LAB — BASIC METABOLIC PANEL
Anion gap: 9 (ref 5–15)
BUN: 15 mg/dL (ref 6–20)
CO2: 24 mmol/L (ref 22–32)
Calcium: 9.2 mg/dL (ref 8.9–10.3)
Chloride: 106 mmol/L (ref 98–111)
Creatinine, Ser: 1.21 mg/dL (ref 0.61–1.24)
GFR, Estimated: >60 mL/min (ref >60)
Glucose, Bld: 95 mg/dL (ref 70–99)
Potassium: 3.9 mmol/L (ref 3.5–5.1)
Sodium: 139 mmol/L (ref 135–145)

## 2021-07-23 LAB — RESP PANEL BY RT-PCR (FLU A&B, COVID) ARPGX2
Influenza A by PCR: NEGATIVE
Influenza B by PCR: NEGATIVE
SARS Coronavirus 2 by RT PCR: NEGATIVE

## 2021-07-23 LAB — ECHOCARDIOGRAM COMPLETE
Area-P 1/2: 3.27 cm2
S' Lateral: 3.2 cm

## 2021-07-23 LAB — APTT: aPTT: 28 seconds (ref 24–36)

## 2021-07-23 LAB — HEPARIN LEVEL (UNFRACTIONATED): Heparin Unfractionated: 0.56 IU/mL (ref 0.30–0.70)

## 2021-07-23 LAB — CBC
HCT: 41.1 % (ref 39.0–52.0)
Hemoglobin: 13.7 g/dL (ref 13.0–17.0)
MCH: 32.3 pg (ref 26.0–34.0)
MCHC: 33.3 g/dL (ref 30.0–36.0)
MCV: 96.9 fL (ref 80.0–100.0)
Platelets: 302 10*3/uL (ref 150–400)
RBC: 4.24 MIL/uL (ref 4.22–5.81)
RDW: 13.5 % (ref 11.5–15.5)
WBC: 6.8 10*3/uL (ref 4.0–10.5)
nRBC: 0 % (ref 0.0–0.2)

## 2021-07-23 LAB — HIV ANTIBODY (ROUTINE TESTING W REFLEX): HIV Screen 4th Generation wRfx: REACTIVE — AB

## 2021-07-23 LAB — PROTIME-INR
INR: 0.9 (ref 0.8–1.2)
Prothrombin Time: 12.6 seconds (ref 11.4–15.2)

## 2021-07-23 LAB — TROPONIN I (HIGH SENSITIVITY): Troponin I (High Sensitivity): 4 ng/L (ref <18)

## 2021-07-23 LAB — BRAIN NATRIURETIC PEPTIDE: B Natriuretic Peptide: 17.3 pg/mL (ref 0.0–100.0)

## 2021-07-23 MED ORDER — ACETAMINOPHEN 650 MG RE SUPP: 650.0000 mg | Freq: Four times a day (QID) | RECTAL | Status: DC | PRN

## 2021-07-23 MED ORDER — SPIRONOLACTONE 25 MG PO TABS
25.0000 mg | ORAL_TABLET | Freq: Every day | ORAL | Status: DC
  Administered 2021-07-23: 25 mg via ORAL
  Filled 2021-07-23: qty 1

## 2021-07-23 MED ORDER — RIVAROXABAN 20 MG PO TABS: 20.0000 mg | ORAL_TABLET | Freq: Every day | ORAL | Status: DC

## 2021-07-23 MED ORDER — ACETAMINOPHEN 325 MG PO TABS: 650.0000 mg | ORAL_TABLET | Freq: Four times a day (QID) | ORAL | Status: DC | PRN

## 2021-07-23 MED ORDER — APIXABAN 5 MG PO TABS
10.0000 mg | ORAL_TABLET | Freq: Two times a day (BID) | ORAL | Status: DC
  Filled 2021-07-23: qty 2

## 2021-07-23 MED ORDER — ONDANSETRON HCL 4 MG PO TABS: 4.0000 mg | ORAL_TABLET | Freq: Four times a day (QID) | ORAL | Status: DC | PRN

## 2021-07-23 MED ORDER — ONDANSETRON HCL 4 MG/2ML IJ SOLN: 4.0000 mg | Freq: Four times a day (QID) | INTRAMUSCULAR | Status: DC | PRN

## 2021-07-23 MED ORDER — HEPARIN (PORCINE) 25000 UT/250ML-% IV SOLN
1900.0000 [IU]/h | INTRAVENOUS | Status: DC
  Administered 2021-07-23: 1900 [IU]/h via INTRAVENOUS
  Filled 2021-07-23: qty 250

## 2021-07-23 MED ORDER — RIVAROXABAN 15 MG PO TABS
15.0000 mg | ORAL_TABLET | Freq: Two times a day (BID) | ORAL | Status: DC
  Administered 2021-07-23 – 2021-07-24 (×3): 15 mg via ORAL
  Filled 2021-07-23 (×4): qty 1

## 2021-07-23 MED ORDER — APIXABAN 5 MG PO TABS: 10.0000 mg | ORAL_TABLET | Freq: Two times a day (BID) | ORAL | Status: DC

## 2021-07-23 MED ORDER — BICTEGRAVIR-EMTRICITAB-TENOFOV 50-200-25 MG PO TABS
1.0000 | ORAL_TABLET | Freq: Every day | ORAL | Status: DC
  Administered 2021-07-23: 1 via ORAL
  Filled 2021-07-23 (×2): qty 1

## 2021-07-23 MED ORDER — HEPARIN BOLUS VIA INFUSION
3000.0000 [IU] | Freq: Once | INTRAVENOUS | Status: AC
  Administered 2021-07-23: 3000 [IU] via INTRAVENOUS
  Filled 2021-07-23: qty 3000

## 2021-07-23 MED ORDER — APIXABAN 5 MG PO TABS: 5.0000 mg | ORAL_TABLET | Freq: Two times a day (BID) | ORAL | Status: DC

## 2021-07-23 MED ORDER — FUROSEMIDE 40 MG PO TABS
60.0000 mg | ORAL_TABLET | Freq: Every day | ORAL | Status: DC
  Administered 2021-07-23: 60 mg via ORAL
  Filled 2021-07-23: qty 2

## 2021-07-23 MED ORDER — IOHEXOL 350 MG/ML SOLN
100.0000 mL | Freq: Once | INTRAVENOUS | Status: AC | PRN
  Administered 2021-07-23: 100 mL via INTRAVENOUS

## 2021-07-23 MED ORDER — METOPROLOL SUCCINATE ER 50 MG PO TB24
50.0000 mg | ORAL_TABLET | Freq: Two times a day (BID) | ORAL | Status: DC
  Administered 2021-07-23 (×2): 50 mg via ORAL
  Filled 2021-07-23 (×2): qty 1

## 2021-07-23 NOTE — Progress Notes (Signed)
ANTICOAGULATION CONSULT NOTE  Pharmacy Consult for IV heparin > PO Rivaroxaban Indication: pulmonary embolus  Allergies  Allergen Reactions   Bee Venom Anaphylaxis   Lisinopril Swelling    Reported on 5/14 by him to his my chart account : was in ED for swollen lips     Sulfamethoxazole-Trimethoprim Rash and Swelling    Patient Measurements:   Height: 6\' 3"  Weight: 115.7 kg Heparin Dosing Weight: 115.7kg  Vital Signs: Temp: 98.2 F (36.8 C) (10/04 0451) Temp Source: Oral (10/04 0451) BP: 142/84 (10/04 1100) Pulse Rate: 81 (10/04 1100)  Labs: Recent Labs    07/23/21 0113 07/23/21 0437 07/23/21 0438  HGB 13.6  --  13.7  HCT 40.5  --  41.1  PLT 299  --  302  APTT  --  28  --   LABPROT  --  12.6  --   INR  --  0.9  --   CREATININE 1.39*  --  1.21  TROPONINIHS 4  --   --      Estimated Creatinine Clearance: 110.2 mL/min (by C-G formula based on SCr of 1.21 mg/dL).   Medical History: Past Medical History:  Diagnosis Date   HFrEF (heart failure with reduced ejection fraction) (HCC)    15% EF as of 2019   HIV (human immunodeficiency virus infection) (HCC)    Hypertension    Polysubstance abuse [facility])       Assessment: 41 yo male presents to ED complaining of SOB and chest pain, found to have PE. Pharmacy initially consulted to dose IV heparin, now asked to transition to PO Rivaroxaban. Patient not on anticoagulation PTA.   CBC WNL, SCr 1.21 Doppler 46 LE negative for DVT   No bleeding issues per nursing  Goal of Therapy:  Treatment of VTE Monitor platelets by anticoagulation protocol: Yes   Plan:  Stop heparin infusion At same time, initiate Rivaroxaban 15mg  PO BID with meals x 21 days, then 20mg  PO daily with supper thereafter Monitor CBC and for s/sx of bleeding Pharmacy to educate patient and provide free 30 day coupon prior to discharge   [place], PharmD, BCPS 07/23/2021, 11:45 AM

## 2021-07-23 NOTE — Discharge Instructions (Signed)
Information on my medicine - XARELTO (rivaroxaban)  WHY WAS XARELTO PRESCRIBED FOR YOU? Xarelto was prescribed to treat blood clots that may have been found in your lungs (pulmonary embolism) and to reduce the risk of them occurring again.  What do you need to know about Xarelto? The starting dose is one 15 mg tablet taken TWICE daily with food for the FIRST 21 DAYS then on 08/13/2021, the dose is changed to one 20 mg tablet taken ONCE A DAY with your evening meal.  DO NOT stop taking Xarelto without talking to the health care provider who prescribed the medication.  Refill your prescription for 20 mg tablets before you run out.  After discharge, you should have regular check-up appointments with your healthcare provider that is prescribing your Xarelto.  In the future your dose may need to be changed if your kidney function changes by a significant amount.  What do you do if you miss a dose? If you are taking Xarelto TWICE DAILY and you miss a dose, take it as soon as you remember. You may take two 15 mg tablets (total 30 mg) at the same time then resume your regularly scheduled 15 mg twice daily the next day.  If you are taking Xarelto ONCE DAILY and you miss a dose, take it as soon as you remember on the same day then continue your regularly scheduled once daily regimen the next day. Do not take two doses of Xarelto at the same time.   Important Safety Information Xarelto is a blood thinner medicine that can cause bleeding. You should call your healthcare provider right away if you experience any of the following: Bleeding from an injury or your nose that does not stop. Unusual colored urine (red or dark brown) or unusual colored stools (red or black). Unusual bruising for unknown reasons. A serious fall or if you hit your head (even if there is no bleeding).  Some medicines may interact with Xarelto and might increase your risk of bleeding while on Xarelto. To help avoid this,  consult your healthcare provider or pharmacist prior to using any new prescription or non-prescription medications, including herbals, vitamins, non-steroidal anti-inflammatory drugs (NSAIDs) and supplements.  This website has more information on Xarelto: VisitDestination.com.br.

## 2021-07-23 NOTE — Progress Notes (Signed)
Echocardiogram 2D Echocardiogram has been performed.  Fraser Busche F Izaan Kingbird RDCS 07/23/2021, 8:48 AM 

## 2021-07-23 NOTE — H&P (Signed)
History and Physical    William Powers QQP:[id number] DOB: [birth year] DOA: 07/22/2021  PCP: Verne Spurr, MD  Patient coming from: Home  I have personally briefly reviewed patient's old medical records in [facility] Link  Chief Complaint: SOB  HPI: William Powers is a 40 y.o. 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  Former EtOH abuse and smoking, reportedly quit in 2019.  Presents to ED with C/O of SOB and CP.  Significant worsening SOB over past couple of days.  Central CP, pressure like sensation.  Constant for last 24h.  Cough with pink tinged sputum.  Has swollen R calf as well.  No h/o DVT/PE previously.  No fevers, chills   ED Course: CT reveals very small PE.   Review of Systems: As per HPI, otherwise all review of systems negative.  Past Medical History:  Diagnosis Date   HFrEF (heart failure with reduced ejection fraction) (HCC)    15% EF as of 2019   HIV (human immunodeficiency virus infection) (HCC)    Hypertension    Polysubstance abuse (HCC)     Past Surgical History:  Procedure Laterality Date   HERNIA REPAIR       reports that he has quit smoking. His smoking use included cigarettes. He smoked an average of 2 packs per day. He has never used smokeless tobacco. He reports that he does not currently use alcohol. He reports that he does not currently use drugs after having used the following drugs: Marijuana.  Allergies  Allergen Reactions   Bee Venom Anaphylaxis   Lisinopril Swelling    Reported on 5/14 by him to his my chart account : was in ED for swollen lips     Sulfamethoxazole-Trimethoprim Rash and Swelling    Family History  Problem Relation Age of Onset   Lupus Mother    HIV Mother    HIV Father      Prior to Admission medications   Medication Sig Start Date End Date Taking? Authorizing Provider  BIKTARVY 50-200-25 MG TABS tablet Take 1 tablet by mouth daily. 06/12/21  Yes [provider]  eplerenone (INSPRA) 25 MG tablet Take 25 mg by mouth daily. 06/13/20 04/10/22 Yes [provider]  furosemide (LASIX) 40 MG tablet Take 60 mg by mouth daily. 03/26/20 05/10/22 Yes [provider]  metoprolol succinate (TOPROL-XL) 50 MG 24 hr tablet Take 50 mg by mouth 2 (two) times daily. 05/12/21  Yes [provider]  amLODipine (NORVASC) 5 MG tablet Take 1 tablet (5 mg total) by mouth daily for 14 days. Patient not taking: Reported on 07/23/2021 02/29/20 03/14/20  Willy Eddy, MD    Physical Exam: Vitals:   07/22/21 2353 07/23/21 0115 07/23/21 0226 07/23/21 0328  BP: 121/82 112/64 (!) 101/55 105/63  Pulse: 77 71 77 81  Resp: 18 16 18 18   Temp: (!) 97.4 F (36.3 C)   98.2 F (36.8 C)  TempSrc: Oral   Oral  SpO2: 96% 98% 93% 96%    Constitutional: NAD, calm, comfortable Eyes: PERRL, lids and conjunctivae normal ENMT: Mucous membranes are moist. Posterior pharynx clear of any exudate or lesions.Normal dentition.  Neck: normal, supple, no masses, no thyromegaly Respiratory: clear to auscultation bilaterally, no wheezing, no crackles. Normal respiratory effort. No accessory muscle use.  Cardiovascular: Regular rate and rhythm, no murmurs / rubs / gallops. No extremity edema. 2+ pedal pulses. No carotid bruits.  Abdomen: no tenderness, no masses  palpated. No hepatosplenomegaly. Bowel sounds positive.  Musculoskeletal: no clubbing / cyanosis. No joint deformity upper and lower extremities. Good ROM, no contractures. Normal muscle tone.  Skin: no rashes, lesions, ulcers. No induration Neurologic: CN 2-12 grossly intact. Sensation intact, DTR normal. Strength 5/5 in all 4.  Psychiatric: Normal judgment and insight. Alert and oriented x 3. Normal mood.    Labs on Admission: I have personally reviewed following labs and imaging studies  CBC: Recent Labs  Lab 07/23/21 0113  WBC 7.3  NEUTROABS 3.7  HGB 13.6  HCT 40.5  MCV 97.6  PLT 299   Basic Metabolic  Panel: Recent Labs  Lab 07/23/21 0113  NA 141  K 3.5  CL 108  CO2 26  GLUCOSE 100*  BUN 14  CREATININE 1.39*  CALCIUM 9.2   GFR: Estimated Creatinine Clearance: 96 mL/min (A) (by C-G formula based on SCr of 1.39 mg/dL (H)). Liver Function Tests: Recent Labs  Lab 07/23/21 0113  AST 33  ALT 27  ALKPHOS 45  BILITOT 0.6  PROT 7.9  ALBUMIN 4.2   Recent Labs  Lab 07/16/21 0637  LIPASE 42   No results for input(s): AMMONIA in the last 168 hours. Coagulation Profile: No results for input(s): INR, PROTIME in the last 168 hours. Cardiac Enzymes: No results for input(s): CKTOTAL, CKMB, CKMBINDEX, TROPONINI in the last 168 hours. BNP (last 3 results) No results for input(s): PROBNP in the last 8760 hours. HbA1C: No results for input(s): HGBA1C in the last 72 hours. CBG: No results for input(s): GLUCAP in the last 168 hours. Lipid Profile: No results for input(s): CHOL, HDL, LDLCALC, TRIG, CHOLHDL, LDLDIRECT in the last 72 hours. Thyroid Function Tests: No results for input(s): TSH, T4TOTAL, FREET4, T3FREE, THYROIDAB in the last 72 hours. Anemia Panel: No results for input(s): VITAMINB12, FOLATE, FERRITIN, TIBC, IRON, RETICCTPCT in the last 72 hours. Urine analysis:    Component Value Date/Time   COLORURINE YELLOW 07/16/2021 0528   APPEARANCEUR CLEAR 07/16/2021 0528   LABSPEC 1.020 07/16/2021 0528   PHURINE 6.0 07/16/2021 0528   GLUCOSEU NEGATIVE 07/16/2021 0528   HGBUR SMALL (A) 07/16/2021 0528   BILIRUBINUR NEGATIVE 07/16/2021 0528   KETONESUR NEGATIVE 07/16/2021 0528   PROTEINUR NEGATIVE 07/16/2021 0528   NITRITE NEGATIVE 07/16/2021 0528   LEUKOCYTESUR NEGATIVE 07/16/2021 0528    Radiological Exams on Admission: DG Chest 2 View  Result Date: 07/23/2021 CLINICAL DATA:  Cough for several months with chest pain, initial encounter EXAM: CHEST - 2 VIEW COMPARISON:  07/16/2021 FINDINGS: Cardiac shadow is stable. The lungs are clear bilaterally. No focal infiltrate  or sizable effusion is seen. No bony abnormality is noted. IMPRESSION: No active cardiopulmonary disease. Electronically Signed   By: Alcide Clever M.D.   On: 07/23/2021 00:55   CT Angio Chest PE W/Cm &/Or Wo Cm  Result Date: 07/23/2021 CLINICAL DATA:  Cough for several months with chest pain, initial encounter EXAM: CT ANGIOGRAPHY CHEST WITH CONTRAST TECHNIQUE: Multidetector CT imaging of the chest was performed using the standard protocol during bolus administration of intravenous contrast. Multiplanar CT image reconstructions and MIPs were obtained to evaluate the vascular anatomy. CONTRAST:  OMNIPAQUE IOHEXOL 350 MG/ML SOLN COMPARISON:  Chest x-ray from earlier in the same day. FINDINGS: Cardiovascular: Thoracic aorta and its branches are within normal limits. No aneurysmal dilatation or dissection is noted. Heart is at the upper limits of normal in size. The pulmonary artery shows a normal branching pattern bilaterally. Filling defect is noted in the  right upper lobe pulmonary artery which is nonocclusive but consistent with small pulmonary embolus. No other emboli are seen. Mediastinum/Nodes: Thoracic inlet is within normal limits. No sizable hilar or mediastinal adenopathy is noted. The esophagus as visualized is within normal limits. Lungs/Pleura: Lungs are well aerated bilaterally. No focal infiltrate or sizable effusion is seen. No sizable nodule is noted. Upper Abdomen: Visualized upper abdomen is within normal limits. Musculoskeletal: Degenerative changes of the thoracic spine are seen. No acute rib abnormality is noted. Review of the MIP images confirms the above findings. IMPRESSION: Nonocclusive filling defect in the right upper lobe pulmonary artery consistent with pulmonary embolus. No right heart strain is seen. No other focal abnormality is noted. Electronically Signed   By: Alcide Clever M.D.   On: 07/23/2021 02:32    EKG: Independently reviewed.  Assessment/Plan Principal Problem:    Single subsegmental pulmonary embolism without acute cor pulmonale (HCC) Active Problems:   Chronic HFrEF (heart failure with reduced ejection fraction) (HCC)   HIV (human immunodeficiency virus infection) (HCC)    PE - Small PE but will get admitted for obs and initial heparin gtt due to extensive h/o CHF Heparin gtt initially then switch to eliquis 2d echo LE [place] for DVT Tele monitor HIV - Cont HAART Chronic HFrEF - Cont home lasix and inspra Cont metoprolol 2d echo as above  DVT prophylaxis: Heparin  Code Status: Full Family Communication: No family in room Disposition Plan: Home after PE and DVT workup Consults called: None Admission status: Place in 25   Stevi Hollinshead M. DO [facility]  How to contact the [facility] Attending or Consulting provider 7A - 7P or covering provider during after hours 7P -7A, for this patient?  Check the care team in [facility] and look for a) attending/consulting [facility] provider listed and b) the [facility] team listed Log into [email or web address]  Amion Physician Scheduling and messaging for groups and whole hospitals  On call and physician scheduling software for group practices, residents, hospitalists and other medical providers for call, clinic, rotation and shift schedules. OnCall Enterprise is a hospital-wide system for scheduling doctors and paging doctors on call. EasyPlot is for scientific plotting and data analysis.  [email or web address]  and use [facility] universal password to access. If you do not have the password, please contact the hospital operator.  Locate the [facility] provider you are looking for under [facility] and page to a number that you can be directly reached. If you still have difficulty reaching the provider, please page the [facility] (Director on Call) for the Hospitalists listed on amion for assistance.  07/23/2021, 4:32 AM

## 2021-07-23 NOTE — Progress Notes (Signed)
Bilateral lower extremity venous duplex has been completed. Preliminary results can be found in CV Proc through chart review.   07/23/21 9:08 AM Olen Cordial RVT

## 2021-07-23 NOTE — Discharge Planning (Signed)
Yaniyah Koors J. Lucretia Roers, RN, BSN, [place] [phone] RNCM consulted regarding benefits check for NOAC therapy.  Pt insurance covers Rx with pt having $3.00 co-pay/Rx.

## 2021-07-23 NOTE — ED Provider Notes (Signed)
[facility]-EMERGENCY DEPT Provider Note   CSN: [id number] Arrival date & time: 07/22/21  2338     History Chief Complaint  Patient presents with   Shortness of Breath    Pt states he hasnt had access to his lasix for 90 days    William Powers is a 40 y.o. male.  The history is provided by medical records.  Shortness of Breath William Powers is a 40 y.o. male who presents to the Emergency Department complaining of shortness of breath and chest pain. He presents the emergency department for evaluation of progressive shortness of breath. He states that he started having difficulty breathing several months ago but over the last few days it has significantly worsened, abruptly over the last few hours. He reports associated central chest pain described as a pressure type sensation that is been constant for the last 24 hours. He also has two days of cough with moderate volume hemoptysis described as bright pink and bloodied. No reports of fevers, abdominal pain, nausea, vomiting. He also reports feeling swollen in his right calf. He does have a history of heart failure. He is not anticoagulated. No history of DVT/PE. No recent surgeries.    Past Medical History:  Diagnosis Date   CHF (congestive heart failure) (HCC)    Hypertension     Patient Active Problem List   Diagnosis Date Noted   Single subsegmental pulmonary embolism without acute cor pulmonale (HCC) 07/23/2021    No past surgical history on file.     No family history on file.  Social History   Tobacco Use   Smoking status: Never   Smokeless tobacco: Never  Vaping Use   Vaping Use: Never used    Home Medications Prior to Admission medications   Medication Sig Start Date End Date Taking? Authorizing Provider  BIKTARVY 50-200-25 MG TABS tablet Take 1 tablet by mouth daily. 06/12/21  Yes [provider]  eplerenone (INSPRA) 25 MG tablet Take 25 mg by mouth daily. 06/13/20 04/10/22 Yes [provider]  furosemide (LASIX) 40 MG tablet Take 60 mg by mouth daily. 03/26/20 05/10/22 Yes [provider]  metoprolol succinate (TOPROL-XL) 50 MG 24 hr tablet Take 50 mg by mouth 2 (two) times daily. 05/12/21  Yes [provider]  amLODipine (NORVASC) 5 MG tablet Take 1 tablet (5 mg total) by mouth daily for 14 days. Patient not taking: Reported on 07/23/2021 02/29/20 03/14/20  Willy Eddy, MD    Allergies    Bee venom, Lisinopril, and Sulfamethoxazole-trimethoprim  Review of Systems   Review of Systems  Respiratory:  Positive for shortness of breath.   All other systems reviewed and are negative.  Physical Exam Updated Vital Signs BP 105/63 (BP Location: Right Arm)   Pulse 81   Temp 98.2 F (36.8 C) (Oral)   Resp 18   SpO2 96%   Physical Exam Vitals and nursing note reviewed.  Constitutional:      Appearance: He is well-developed.  HENT:     Head: Normocephalic and atraumatic.  Cardiovascular:     Rate and Rhythm: Normal rate and regular rhythm.     Heart sounds: No murmur heard. Pulmonary:     Effort: Pulmonary effort is normal. No respiratory distress.     Breath sounds: Normal breath sounds.  Abdominal:     Palpations: Abdomen is soft.     Tenderness: There is no abdominal tenderness. There is no guarding or rebound.  Musculoskeletal:  General: No swelling or tenderness.  Skin:    General: Skin is warm and dry.  Neurological:     Mental Status: He is alert and oriented to person, place, and time.  Psychiatric:        Behavior: Behavior normal.    ED Results / Procedures / Treatments   Labs (all labs ordered are listed, but only abnormal results are displayed) Labs Reviewed  COMPREHENSIVE METABOLIC PANEL - Abnormal; Notable for the following components:      Result Value   Glucose, Bld 100 (*)    Creatinine, Ser 1.39 (*)    All other components within normal limits  CBC WITH DIFFERENTIAL/PLATELET - Abnormal; Notable for the  following components:   RBC 4.15 (*)    All other components within normal limits  RESP PANEL BY RT-PCR (FLU A&B, COVID) ARPGX2  BRAIN NATRIURETIC PEPTIDE  HIV ANTIBODY (ROUTINE TESTING W REFLEX)  CBC  BASIC METABOLIC PANEL  TROPONIN I (HIGH SENSITIVITY)    EKG EKG Interpretation  Date/Time:  Tuesday July 23 2021 01:07:41 EDT Ventricular Rate:  70 PR Interval:  200 QRS Duration: 98 QT Interval:  392 QTC Calculation: 423 R Axis:   56 Text Interpretation: Normal sinus rhythm ST elevation, consider early repolarization, pericarditis, or injury Abnormal ECG Confirmed by Tilden Fossa [phone]) on 07/23/2021 1:20:24 AM  Radiology DG Chest 2 View  Result Date: 07/23/2021 CLINICAL DATA:  Cough for several months with chest pain, initial encounter EXAM: CHEST - 2 VIEW COMPARISON:  07/16/2021 FINDINGS: Cardiac shadow is stable. The lungs are clear bilaterally. No focal infiltrate or sizable effusion is seen. No bony abnormality is noted. IMPRESSION: No active cardiopulmonary disease. Electronically Signed   By: Alcide Clever M.D.   On: 07/23/2021 00:55   CT Angio Chest PE W/Cm &/Or Wo Cm  Result Date: 07/23/2021 CLINICAL DATA:  Cough for several months with chest pain, initial encounter EXAM: CT ANGIOGRAPHY CHEST WITH CONTRAST TECHNIQUE: Multidetector CT imaging of the chest was performed using the standard protocol during bolus administration of intravenous contrast. Multiplanar CT image reconstructions and MIPs were obtained to evaluate the vascular anatomy. CONTRAST:  OMNIPAQUE IOHEXOL 350 MG/ML SOLN COMPARISON:  Chest x-ray from earlier in the same day. FINDINGS: Cardiovascular: Thoracic aorta and its branches are within normal limits. No aneurysmal dilatation or dissection is noted. Heart is at the upper limits of normal in size. The pulmonary artery shows a normal branching pattern bilaterally. Filling defect is noted in the right upper lobe pulmonary artery which is nonocclusive  but consistent with small pulmonary embolus. No other emboli are seen. Mediastinum/Nodes: Thoracic inlet is within normal limits. No sizable hilar or mediastinal adenopathy is noted. The esophagus as visualized is within normal limits. Lungs/Pleura: Lungs are well aerated bilaterally. No focal infiltrate or sizable effusion is seen. No sizable nodule is noted. Upper Abdomen: Visualized upper abdomen is within normal limits. Musculoskeletal: Degenerative changes of the thoracic spine are seen. No acute rib abnormality is noted. Review of the MIP images confirms the above findings. IMPRESSION: Nonocclusive filling defect in the right upper lobe pulmonary artery consistent with pulmonary embolus. No right heart strain is seen. No other focal abnormality is noted. Electronically Signed   By: Alcide Clever M.D.   On: 07/23/2021 02:32    Procedures Procedures   Medications Ordered in ED Medications  apixaban (ELIQUIS) tablet 10 mg (has no administration in time range)    Followed by  apixaban (ELIQUIS) tablet 5 mg (has no  administration in time range)  acetaminophen (TYLENOL) tablet 650 mg (has no administration in time range)    Or  acetaminophen (TYLENOL) suppository 650 mg (has no administration in time range)  ondansetron (ZOFRAN) tablet 4 mg (has no administration in time range)    Or  ondansetron (ZOFRAN) injection 4 mg (has no administration in time range)  iohexol (OMNIPAQUE) 350 MG/ML injection 100 mL (100 mLs Intravenous Contrast Given 07/23/21 0206)    ED Course  I have reviewed the triage vital signs and the nursing notes.  Pertinent labs & imaging results that were available during my care of the patient were reviewed by me and considered in my medical decision making (see chart for details).    MDM Rules/Calculators/A&P                          Pt with hx/o CHF, HIV here for evaluation of progressive SOB, hemoptysis and RLE pain. He is non-toxic appearing on evaluation with no  respiratory distress. Given his presenting symptoms was a concern for PE and a CTA was obtained. CT demonstrates a small pulmonary embolism. Given patient's report of hemoptysis as well as history of heart failure, last documented EFF less than 15% in 2019 recommend observation admission as he is higher risk for complications. Patient updated findings of studies recommendation for mission and he is in agreement with treatment plan. Hospitalist consulted for admission.  Final Clinical Impression(s) / ED Diagnoses Final diagnoses:  Other acute pulmonary embolism without acute cor pulmonale [facility])    Rx / DC Orders ED Discharge Orders     None        Tilden Fossa, MD 07/23/21 0406

## 2021-07-23 NOTE — Plan of Care (Signed)
  Problem: Health Behavior/Discharge Planning: Goal: Ability to manage health-related needs will improve Outcome: Progressing   

## 2021-07-23 NOTE — ED Notes (Signed)
Vascular tech at bedside. °

## 2021-07-23 NOTE — Progress Notes (Addendum)
Patient seen and examined, admitted by Dr. Julian Reil this a.m.  Briefly 40 year old male with history of dilated cardiomyopathy, CHF, HIV hypertension presented to ED with shortness of breath and chest pain.  Patient reported significant worsening of shortness of breath over the last couple of days and pressure-like sensation in the chest.  Also reported coughing with pink-tinged sputum.  Has swollen right calf as well.  No prior history of DVT or PE.  CTA chest showed nonocclusive filling defect in the right upper lobe pulmonary artery consistent with PE, no right heart strain is seen.  BP 122/89   Pulse 79   Temp 98.2 F (36.8 C) (Oral)   Resp 16   SpO2 99%   Physical Exam General: Alert and oriented x 3, NAD Cardiovascular: S1 S2 clear, RRR. No pedal edema b/l Respiratory: CTAB, no wheezing, rales or rhonchi Gastrointestinal: Soft, nontender, nondistended, NBS Ext: no pedal edema bilaterally  A/p Acute right sided PE -Unclear etiology, no provoking factors identified (no prior history of PE/DVT, no recent long distance travels or trauma or recent COVID) -  Doppler US LE negative for DVT   -Started on IV heparin drip, follow 2D echo -TOC consult for benefits for NOAC.  Discussed with the patient regarding NOAC's and agrees with management.  History of systolic CHF/DCM -2D echo showed EF of 55 to 60%, no regional wall motion abnormalities, -Currently compensated, continue furosemide, Toprol-XL, Aldactone. -Counseled patient on compliance with medications   Vedika Dumlao M.D.  [facility] 07/23/2021, 10:32 AM    Addendum: Complaining of dizziness, will check orthostatic vitals, does not feel comfortable going home today.  Discussed the results of 2D echo and Doppler ultrasound with the patient. Benefits check completed, will start Xarelto per pharmacy for acute PE.   Thad Ranger M.D.  [facility] 07/23/2021, 11:17 AM

## 2021-07-23 NOTE — ED Notes (Signed)
Truddie Hidden, RN attempted to check pt's pulse oximetry while ambulating. Pt states "Im too dizzy. I cant walk right now," despite ambulation to bathroom 10 mins ago.

## 2021-07-23 NOTE — ED Notes (Signed)
Patient ambulated independently, maintained O2 sats between 97-98% with good waveform observed.

## 2021-07-23 NOTE — ED Notes (Signed)
Pt ambulatory to restroom

## 2021-07-23 NOTE — Plan of Care (Signed)
  Problem: Education: Goal: Knowledge of General Education information will improve Description: Including pain rating scale, medication(s)/side effects and non-pharmacologic comfort measures Outcome: Completed/Met

## 2021-07-23 NOTE — Progress Notes (Signed)
ANTICOAGULATION CONSULT NOTE - Initial Consult  Pharmacy Consult for heparin Indication: pulmonary embolus  Allergies  Allergen Reactions   Bee Venom Anaphylaxis   Lisinopril Swelling    Reported on 5/14 by him to his my chart account : was in ED for swollen lips     Sulfamethoxazole-Trimethoprim Rash and Swelling    Patient Measurements:   Heparin Dosing Weight: 115.7kg  Vital Signs: Temp: 98.2 F (36.8 C) (10/04 0328) Temp Source: Oral (10/04 0328) BP: 105/63 (10/04 0328) Pulse Rate: 81 (10/04 0328)  Labs: Recent Labs    07/23/21 0113  HGB 13.6  HCT 40.5  PLT 299  CREATININE 1.39*  TROPONINIHS 4    Estimated Creatinine Clearance: 96 mL/min (A) (by C-G formula based on SCr of 1.39 mg/dL (H)).   Medical History: Past Medical History:  Diagnosis Date   CHF (congestive heart failure) (HCC)    Hypertension       Assessment: 40 yo male presents to ED complaining of SOB and chest pain.  Pharmacy consulted to dose heparin for PE.  No prior AC  CBC WNL, Scr 1.39   Goal of Therapy:  Heparin level 0.3-0.7 units/ml Monitor platelets by anticoagulation protocol: Yes   Plan:  Per [place] bolus heparin 3000 units x 1 Then start heparin drip at 1900 units/hr Heparin level in 8 hours Daily CBC Plan is to start apixaban in 24 hours  Arley Phenix RPh 07/23/2021, 4:08 AM

## 2021-07-23 NOTE — ED Notes (Signed)
ECHO at bedside.

## 2021-07-24 DIAGNOSIS — I2693 Single subsegmental pulmonary embolism without acute cor pulmonale: Secondary | ICD-10-CM | POA: Diagnosis not present

## 2021-07-24 DIAGNOSIS — I2699 Other pulmonary embolism without acute cor pulmonale: Secondary | ICD-10-CM

## 2021-07-24 DIAGNOSIS — I5022 Chronic systolic (congestive) heart failure: Secondary | ICD-10-CM | POA: Diagnosis not present

## 2021-07-24 DIAGNOSIS — B20 Human immunodeficiency virus [HIV] disease: Secondary | ICD-10-CM | POA: Diagnosis not present

## 2021-07-24 LAB — CBC
HCT: 45.4 % (ref 39.0–52.0)
Hemoglobin: 15.5 g/dL (ref 13.0–17.0)
MCH: 32.7 pg (ref 26.0–34.0)
MCHC: 34.1 g/dL (ref 30.0–36.0)
MCV: 95.8 fL (ref 80.0–100.0)
Platelets: 332 10*3/uL (ref 150–400)
RBC: 4.74 MIL/uL (ref 4.22–5.81)
RDW: 13.7 % (ref 11.5–15.5)
WBC: 7.8 10*3/uL (ref 4.0–10.5)
nRBC: 0 % (ref 0.0–0.2)

## 2021-07-24 MED ORDER — RIVAROXABAN (XARELTO) VTE STARTER PACK (15 & 20 MG): ORAL_TABLET | ORAL | 0 refills | Status: DC

## 2021-07-24 MED ORDER — RIVAROXABAN 20 MG PO TABS: 20.0000 mg | ORAL_TABLET | Freq: Every day | ORAL | 3 refills | Status: DC

## 2021-07-24 NOTE — Discharge Summary (Signed)
Physician Discharge Summary   Patient ID: William Powers MRN: [id number] DOB/AGE: 41-11-1980 41 y.o.  Admit date: 07/22/2021 Discharge date: 07/24/2021  Primary Care Physician:  Verne Spurr, MD   Recommendations for Outpatient Follow-up:  Follow up with PCP in 1-2 weeks Patient started on Xarelto 15 mg twice a day for 3 weeks and then on 08/13/21 switch to 20 mg daily with food.  Patient was recommended to continue Xarelto until advised by his primary physician to stop.  Home Health: None, currently at baseline Equipment/Devices:   Discharge Condition: stable  CODE STATUS: FULL  Diet recommendation: Heart healthy diet   Discharge Diagnoses:     Single subsegmental pulmonary embolism without acute cor pulmonale (HCC)  Chronic HFrEF (heart failure with reduced ejection fraction) (HCC)  HIV (human immunodeficiency virus infection) (HCC)   Consults: None    Allergies:   Allergies  Allergen Reactions   Bee Venom Anaphylaxis   Lisinopril Swelling    Reported on 5/14 by him to his my chart account : was in ED for swollen lips     Sulfamethoxazole-Trimethoprim Rash and Swelling     DISCHARGE MEDICATIONS: Allergies as of 07/24/2021       Reactions   Bee Venom Anaphylaxis   Lisinopril Swelling   Reported on 5/14 by him to his my chart account : was in ED for swollen lips    Sulfamethoxazole-trimethoprim Rash, Swelling        Medication List     TAKE these medications    Biktarvy 50-200-25 MG Tabs tablet Generic drug: bictegravir-emtricitabine-tenofovir AF Take 1 tablet by mouth daily.   eplerenone 25 MG tablet Commonly known as: INSPRA Take 25 mg by mouth daily.   furosemide 40 MG tablet Commonly known as: LASIX Take 60 mg by mouth daily.   metoprolol succinate 50 MG 24 hr tablet Commonly known as: TOPROL-XL Take 50 mg by mouth 2 (two) times daily.   Rivaroxaban Stater Pack (15 mg and 20 mg) Commonly known as: XARELTO STARTER PACK Follow package  directions: Take one 15mg  tablet by mouth twice a day. On day 22 (08/13/2021), switch to one 20mg  tablet once a day. Take with food.   rivaroxaban 20 MG Tabs tablet Commonly known as: XARELTO Take 1 tablet (20 mg total) by mouth daily with supper. Please start this prescription after you have completed the starter pack. Start taking on: August 13, 2021         Brief H and P: For complete details please refer to admission H and P, but in brief, 40 year old male with history of dilated cardiomyopathy, CHF, HIV hypertension presented to ED with shortness of breath and chest pain.  Patient reported significant worsening of shortness of breath over the last couple of days and pressure-like sensation in the chest.  Also reported coughing with pink-tinged sputum.  Has swollen right calf as well.  No prior history of DVT or PE.   CTA chest showed nonocclusive filling defect in the right upper lobe pulmonary artery consistent with PE, no right heart strain is seen.  Patient was admitted for further work-up.  Hospital Course:   Acute right sided PE -Unclear etiology, no provoking factors identified (no prior history of PE/DVT, no recent long distance travels or trauma or recent COVID) -  Doppler August 15, 2021 LE negative for DVT   -Patient was initially placed on IV heparin drip, then transitioned to Xarelto.  Patient was counseled on risks and benefits of Xarelto and compliance with the anticoagulation.  He will continue Xarelto until advised by his PCP or cardiology to stop anticoagulation. -2D echo showed EF of 55 to 60% with no regional wall motion abnormalities -Currently ambulating without any difficulty, no hypoxia, chest pain or shortness of breath.   History of systolic CHF/DCM -2D echo showed EF of 55 to 60%, no regional wall motion abnormalities, -Currently compensated, continue furosemide, Toprol-XL, Aldactone. -Counseled patient on compliance with medications  History of HIV -Continue  Biktarvy     Day of Discharge S: No acute complaints, ambulating without any difficulty, no chest pain or shortness of breath.  Looking forward to go home.  BP 127/83   Pulse 71   Temp 98.2 F (36.8 C)   Resp 18   Ht 6\' 3"  (1.905 m)   Wt 115.7 kg   SpO2 99%   BMI 31.87 kg/m   Physical Exam: General: Alert and awake oriented x3 not in any acute distress. CVS: S1-S2 clear no murmur rubs or gallops Chest: clear to auscultation bilaterally, no wheezing rales or rhonchi Abdomen: soft nontender, nondistended, normal bowel sounds Extremities: no cyanosis, clubbing or edema noted bilaterally Neuro: Cranial nerves II-XII intact, no focal neurological deficits    Get Medicines reviewed and adjusted: Please take all your medications with you for your next visit with your Primary MD  Please request your Primary MD to go over all hospital tests and procedure/radiological results at the follow up. Please ask your Primary MD to get all Hospital records sent to his/her office.  If you experience worsening of your admission symptoms, develop shortness of breath, life threatening emergency, suicidal or homicidal thoughts you must seek medical attention immediately by calling 911 or calling your MD immediately  if symptoms less severe.  You must read complete instructions/literature along with all the possible adverse reactions/side effects for all the Medicines you take and that have been prescribed to you. Take any new Medicines after you have completely understood and accept all the possible adverse reactions/side effects.   Do not drive when taking pain medications.   Do not take more than prescribed Pain, Sleep and Anxiety Medications  Special Instructions: If you have smoked or chewed Tobacco  in the last 2 yrs please stop smoking, stop any regular Alcohol  and or any Recreational drug use.  Wear Seat belts while driving.  Please note  You were cared for by a hospitalist during your  hospital stay. Once you are discharged, your primary care physician will handle any further medical issues. Please note that NO REFILLS for any discharge medications will be authorized once you are discharged, as it is imperative that you return to your primary care physician (or establish a relationship with a primary care physician if you do not have one) for your aftercare needs so that they can reassess your need for medications and monitor your lab values.   The results of significant diagnostics from this hospitalization (including imaging, microbiology, ancillary and laboratory) are listed below for reference.      Procedures/Studies:  DG Chest 2 View  Result Date: 07/23/2021 CLINICAL DATA:  Cough for several months with chest pain, initial encounter EXAM: CHEST - 2 VIEW COMPARISON:  07/16/2021 FINDINGS: Cardiac shadow is stable. The lungs are clear bilaterally. No focal infiltrate or sizable effusion is seen. No bony abnormality is noted. IMPRESSION: No active cardiopulmonary disease. Electronically Signed   By: Alcide Clever M.D.   On: 07/23/2021 00:55   DG Chest 2 View  Result Date: 07/16/2021 CLINICAL  DATA:  Chest pain. EXAM: CHEST - 2 VIEW COMPARISON:  06/08/2021 FINDINGS: The cardiomediastinal silhouette is unchanged with normal heart size. The lungs are mildly hypoinflated. No airspace consolidation, edema, pleural effusion, pneumothorax is identified. No acute osseous abnormality is seen. IMPRESSION: No active cardiopulmonary disease. Electronically Signed   By: Sebastian Ache M.D.   On: 07/16/2021 07:13   CT Angio Chest PE W/Cm &/Or Wo Cm  Result Date: 07/23/2021 CLINICAL DATA:  Cough for several months with chest pain, initial encounter EXAM: CT ANGIOGRAPHY CHEST WITH CONTRAST TECHNIQUE: Multidetector CT imaging of the chest was performed using the standard protocol during bolus administration of intravenous contrast. Multiplanar CT image reconstructions and MIPs were obtained to  evaluate the vascular anatomy. CONTRAST:  OMNIPAQUE IOHEXOL 350 MG/ML SOLN COMPARISON:  Chest x-ray from earlier in the same day. FINDINGS: Cardiovascular: Thoracic aorta and its branches are within normal limits. No aneurysmal dilatation or dissection is noted. Heart is at the upper limits of normal in size. The pulmonary artery shows a normal branching pattern bilaterally. Filling defect is noted in the right upper lobe pulmonary artery which is nonocclusive but consistent with small pulmonary embolus. No other emboli are seen. Mediastinum/Nodes: Thoracic inlet is within normal limits. No sizable hilar or mediastinal adenopathy is noted. The esophagus as visualized is within normal limits. Lungs/Pleura: Lungs are well aerated bilaterally. No focal infiltrate or sizable effusion is seen. No sizable nodule is noted. Upper Abdomen: Visualized upper abdomen is within normal limits. Musculoskeletal: Degenerative changes of the thoracic spine are seen. No acute rib abnormality is noted. Review of the MIP images confirms the above findings. IMPRESSION: Nonocclusive filling defect in the right upper lobe pulmonary artery consistent with pulmonary embolus. No right heart strain is seen. No other focal abnormality is noted. Electronically Signed   By: Alcide Clever M.D.   On: 07/23/2021 02:32   CT Abdomen Pelvis W Contrast  Result Date: 07/16/2021 CLINICAL DATA:  Microhematuria with unknown cause EXAM: CT ABDOMEN AND PELVIS WITH CONTRAST TECHNIQUE: Multidetector CT imaging of the abdomen and pelvis was performed using the standard protocol following bolus administration of intravenous contrast. CONTRAST:  28mL OMNIPAQUE IOHEXOL 350 MG/ML SOLN COMPARISON:  None. FINDINGS: Lower chest:  Partially covered gynecomastia on the left. Hepatobiliary: No focal liver abnormality.No evidence of biliary obstruction or stone. Pancreas: Unremarkable. Spleen: Unremarkable. Adrenals/Urinary Tract: Negative adrenals. No  hydronephrosis or stone. Patchy renal cortical scarring at the left lower pole. Unremarkable bladder. Stomach/Bowel:  No obstruction. No appendicitis. Vascular/Lymphatic: No acute vascular abnormality. No mass or adenopathy. Reproductive:No pathologic findings. Other: No ascites or pneumoperitoneum. Shallow, fatty right inguinal hernia. Musculoskeletal: No acute abnormalities. IMPRESSION: 1. No specific cause for symptoms.  No mass or stone. 2. Patchy cortical scarring at the lower pole left kidney. Electronically Signed   By: Tiburcio Pea M.D.   On: 07/16/2021 07:42   ECHOCARDIOGRAM COMPLETE  Result Date: 07/23/2021    ECHOCARDIOGRAM REPORT   Patient Name:   William Powers Date of Exam: 07/23/2021 Medical Rec #:  [id number]   Height:       75.0 in Accession #:    [id number]  Weight:       255.0 lb Date of Birth:  Apr 29, 1981    BSA:          2.433 m Patient Age:    40 years    BP:           119/90 mmHg Patient Gender: M  HR:           74 bpm. Exam Location:  Inpatient Procedure: 2D Echo, Color Doppler and Cardiac Doppler Indications:    I50.31 Acute diastolic (congestive) heart failure; I26.02                 Pulmonary embolus  History:        Patient has no prior history of Echocardiogram examinations.                 CHF; Risk Factors:Hypertension and Polysubstance abuse. Prior                 echo at [facility] per patient.  Sonographer:    Irving Burton Senior RDCS Referring Phys: [phone] JARED M GARDNER IMPRESSIONS  1. Left ventricular ejection fraction, by estimation, is 55 to 60%. The left ventricle has normal function. The left ventricle has no regional wall motion abnormalities. Left ventricular diastolic parameters were normal.  2. Right ventricular systolic function is normal. The right ventricular size is normal.  3. The mitral valve is abnormal. Trivial mitral valve regurgitation. No evidence of mitral stenosis.  4. The aortic valve is tricuspid. Aortic valve regurgitation is not visualized. Mild aortic valve  sclerosis is present, with no evidence of aortic valve stenosis.  5. The inferior vena cava is normal in size with greater than 50% respiratory variability, suggesting right atrial pressure of 3 mmHg. FINDINGS  Left Ventricle: Left ventricular ejection fraction, by estimation, is 55 to 60%. The left ventricle has normal function. The left ventricle has no regional wall motion abnormalities. The left ventricular internal cavity size was normal in size. There is  no left ventricular hypertrophy. Left ventricular diastolic parameters were normal. Right Ventricle: The right ventricular size is normal. No increase in right ventricular wall thickness. Right ventricular systolic function is normal. Left Atrium: Left atrial size was normal in size. Right Atrium: Right atrial size was normal in size. Pericardium: There is no evidence of pericardial effusion. Mitral Valve: The mitral valve is abnormal. There is mild thickening of the mitral valve leaflet(s). There is mild calcification of the mitral valve leaflet(s). Mild mitral annular calcification. Trivial mitral valve regurgitation. No evidence of mitral valve stenosis. Tricuspid Valve: The tricuspid valve is normal in structure. Tricuspid valve regurgitation is trivial. No evidence of tricuspid stenosis. Aortic Valve: The aortic valve is tricuspid. Aortic valve regurgitation is not visualized. Mild aortic valve sclerosis is present, with no evidence of aortic valve stenosis. Pulmonic Valve: The pulmonic valve was normal in structure. Pulmonic valve regurgitation is not visualized. No evidence of pulmonic stenosis. Aorta: The aortic root is normal in size and structure. Venous: The inferior vena cava is normal in size with greater than 50% respiratory variability, suggesting right atrial pressure of 3 mmHg. IAS/Shunts: No atrial level shunt detected by color flow Doppler.  LEFT VENTRICLE PLAX 2D LVIDd:         4.70 cm  Diastology LVIDs:         3.20 cm  LV e' medial:     8.92 cm/s LV PW:         0.80 cm  LV E/e' medial:  6.6 LV IVS:        1.00 cm  LV e' lateral:   9.36 cm/s LVOT diam:     2.30 cm  LV E/e' lateral: 6.3 LV SV:         72 LV SV Index:   30 LVOT Area:  4.15 cm  RIGHT VENTRICLE RV S prime:     10.20 cm/s TAPSE (M-mode): 2.2 cm LEFT ATRIUM             Index       RIGHT ATRIUM           Index LA diam:        3.40 cm 1.40 cm/m  RA Area:     18.60 cm LA Vol (A2C):   62.8 ml 25.81 ml/m RA Volume:   51.60 ml  21.21 ml/m LA Vol (A4C):   41.0 ml 16.85 ml/m LA Biplane Vol: 52.9 ml 21.74 ml/m  AORTIC VALVE LVOT Vmax:   96.60 cm/s LVOT Vmean:  64.800 cm/s LVOT VTI:    0.173 m  AORTA Ao Root diam: 3.40 cm Ao Asc diam:  3.00 cm MITRAL VALVE MV Area (PHT): 3.27 cm    SHUNTS MV Decel Time: 232 msec    Systemic VTI:  0.17 m MV E velocity: 59.10 cm/s  Systemic Diam: 2.30 cm MV A velocity: 51.40 cm/s MV E/A ratio:  1.15 Charlton Haws MD Electronically signed by Charlton Haws MD Signature Date/Time: 07/23/2021/8:58:29 AM    Final    VAS [place] LOWER EXTREMITY VENOUS (DVT)  Result Date: 07/23/2021  Lower Venous DVT Study Patient Name:  William Powers  Date of Exam:   07/23/2021 Medical Rec #: [id number]    Accession #:    [id number] Date of Birth: 1981/01/16     Patient Gender: M Patient Age:   62 years Exam Location:  [facility] Procedure:      VAS [place] LOWER EXTREMITY VENOUS (DVT) Referring Phys: Lyda Perone --------------------------------------------------------------------------------  Indications: Pulmonary embolism.  Risk Factors: Confirmed PE. Anticoagulation: Heparin. Comparison Study: No prior studies. Performing Technologist: Chanda Busing RVT  Examination Guidelines: A complete evaluation includes B-mode imaging, spectral Doppler, color Doppler, and power Doppler as needed of all accessible portions of each vessel. Bilateral testing is considered an integral part of a complete examination. Limited examinations for reoccurring indications may be performed as noted.  The reflux portion of the exam is performed with the patient in reverse Trendelenburg.  +---------+---------------+---------+-----------+----------+--------------+ RIGHT    CompressibilityPhasicitySpontaneityPropertiesThrombus Aging +---------+---------------+---------+-----------+----------+--------------+ CFV      Full           Yes      Yes                                 +---------+---------------+---------+-----------+----------+--------------+ SFJ      Full                                                        +---------+---------------+---------+-----------+----------+--------------+ FV Prox  Full                                                        +---------+---------------+---------+-----------+----------+--------------+ FV Mid   Full                                                        +---------+---------------+---------+-----------+----------+--------------+  FV DistalFull                                                        +---------+---------------+---------+-----------+----------+--------------+ PFV      Full                                                        +---------+---------------+---------+-----------+----------+--------------+ POP      Full           Yes      Yes                                 +---------+---------------+---------+-----------+----------+--------------+ PTV      Full                                                        +---------+---------------+---------+-----------+----------+--------------+ PERO     Full                                                        +---------+---------------+---------+-----------+----------+--------------+   +---------+---------------+---------+-----------+----------+--------------+ LEFT     CompressibilityPhasicitySpontaneityPropertiesThrombus Aging +---------+---------------+---------+-----------+----------+--------------+ CFV      Full           Yes       Yes                                 +---------+---------------+---------+-----------+----------+--------------+ SFJ      Full                                                        +---------+---------------+---------+-----------+----------+--------------+ FV Prox  Full                                                        +---------+---------------+---------+-----------+----------+--------------+ FV Mid   Full                                                        +---------+---------------+---------+-----------+----------+--------------+ FV DistalFull                                                        +---------+---------------+---------+-----------+----------+--------------+  PFV      Full                                                        +---------+---------------+---------+-----------+----------+--------------+ POP      Full           Yes      Yes                                 +---------+---------------+---------+-----------+----------+--------------+ PTV      Full                                                        +---------+---------------+---------+-----------+----------+--------------+ PERO     Full                                                        +---------+---------------+---------+-----------+----------+--------------+     Summary: RIGHT: - There is no evidence of deep vein thrombosis in the lower extremity.  - No cystic structure found in the popliteal fossa.  LEFT: - There is no evidence of deep vein thrombosis in the lower extremity.  - No cystic structure found in the popliteal fossa.  *See table(s) above for measurements and observations. Electronically signed by Waverly Ferrari MD on 07/23/2021 at 5:41:28 PM.    Final       LAB RESULTS: Basic Metabolic Panel: Recent Labs  Lab 07/23/21 0113 07/23/21 0438  NA 141 139  K 3.5 3.9  CL 108 106  CO2 26 24  GLUCOSE 100* 95  BUN 14 15  CREATININE 1.39* 1.21   CALCIUM 9.2 9.2   Liver Function Tests: Recent Labs  Lab 07/23/21 0113  AST 33  ALT 27  ALKPHOS 45  BILITOT 0.6  PROT 7.9  ALBUMIN 4.2   No results for input(s): LIPASE, AMYLASE in the last 168 hours. No results for input(s): AMMONIA in the last 168 hours. CBC: Recent Labs  Lab 07/23/21 0113 07/23/21 0438 07/24/21 0746  WBC 7.3 6.8 7.8  NEUTROABS 3.7  --   --   HGB 13.6 13.7 15.5  HCT 40.5 41.1 45.4  MCV 97.6 96.9 95.8  PLT 299 302 332   Cardiac Enzymes: No results for input(s): CKTOTAL, CKMB, CKMBINDEX, TROPONINI in the last 168 hours. BNP: Invalid input(s): POCBNP CBG: No results for input(s): GLUCAP in the last 168 hours.     Disposition and Follow-up: Discharge Instructions     Diet - low sodium heart healthy   Complete by: As directed    Discharge instructions   Complete by: As directed    Please take Xarelto one 15mg  tablet by mouth twice a day. On day 22 (08/13/2021), switch to one 20mg  tablet once a day. Take with food.   Increase activity slowly   Complete by: As directed         DISPOSITION: Home   DISCHARGE FOLLOW-UP  Follow-up Information  Verne Spurr, MD Follow up.   Specialty: Family Medicine Why: for hospital follow-up                 Time coordinating discharge:    Signed:   Thad Ranger M.D. [facility] 07/24/2021, 8:48 AM

## 2021-07-25 LAB — HIV-1/2 AB - DIFFERENTIATION
HIV 1 Ab: REACTIVE
HIV 2 Ab: NONREACTIVE

## 2021-07-26 ENCOUNTER — Emergency Department (HOSPITAL_COMMUNITY)
Admission: EM | Admit: 2021-07-26 | Discharge: 2021-07-26 | Disposition: A | Discharge status: Home / Self Care | Payer: Medicaid Other | Attending: Emergency Medicine | Admitting: Emergency Medicine

## 2021-07-26 ENCOUNTER — Encounter (HOSPITAL_COMMUNITY): Payer: Self-pay | Admitting: Emergency Medicine

## 2021-07-26 ENCOUNTER — Emergency Department (HOSPITAL_COMMUNITY): Payer: Medicaid Other

## 2021-07-26 ENCOUNTER — Other Ambulatory Visit: Payer: Self-pay

## 2021-07-26 DIAGNOSIS — Z79899 Other long term (current) drug therapy: Secondary | ICD-10-CM | POA: Diagnosis not present

## 2021-07-26 DIAGNOSIS — K625 Hemorrhage of anus and rectum: Secondary | ICD-10-CM | POA: Diagnosis not present

## 2021-07-26 DIAGNOSIS — Z7901 Long term (current) use of anticoagulants: Secondary | ICD-10-CM | POA: Diagnosis not present

## 2021-07-26 DIAGNOSIS — I110 Hypertensive heart disease with heart failure: Secondary | ICD-10-CM | POA: Insufficient documentation

## 2021-07-26 DIAGNOSIS — Z21 Asymptomatic human immunodeficiency virus [HIV] infection status: Secondary | ICD-10-CM | POA: Insufficient documentation

## 2021-07-26 DIAGNOSIS — I509 Heart failure, unspecified: Secondary | ICD-10-CM | POA: Insufficient documentation

## 2021-07-26 DIAGNOSIS — R1032 Left lower quadrant pain: Secondary | ICD-10-CM | POA: Diagnosis not present

## 2021-07-26 DIAGNOSIS — Z87891 Personal history of nicotine dependence: Secondary | ICD-10-CM | POA: Insufficient documentation

## 2021-07-26 LAB — URINALYSIS, ROUTINE W REFLEX MICROSCOPIC
Bacteria, UA: NONE SEEN
Bilirubin Urine: NEGATIVE
Glucose, UA: NEGATIVE mg/dL
Ketones, ur: NEGATIVE mg/dL
Nitrite: NEGATIVE
Protein, ur: NEGATIVE mg/dL
Specific Gravity, Urine: 1.019 (ref 1.005–1.030)
pH: 5 (ref 5.0–8.0)

## 2021-07-26 LAB — COMPREHENSIVE METABOLIC PANEL
ALT: 30 U/L (ref 0–44)
AST: 32 U/L (ref 15–41)
Albumin: 4.5 g/dL (ref 3.5–5.0)
Alkaline Phosphatase: 52 U/L (ref 38–126)
Anion gap: 9 (ref 5–15)
BUN: 19 mg/dL (ref 6–20)
CO2: 24 mmol/L (ref 22–32)
Calcium: 9.3 mg/dL (ref 8.9–10.3)
Chloride: 105 mmol/L (ref 98–111)
Creatinine, Ser: 1.2 mg/dL (ref 0.61–1.24)
GFR, Estimated: >60 mL/min (ref >60)
Glucose, Bld: 102 mg/dL — ABNORMAL HIGH (ref 70–99)
Potassium: 3.7 mmol/L (ref 3.5–5.1)
Sodium: 138 mmol/L (ref 135–145)
Total Bilirubin: 0.8 mg/dL (ref 0.3–1.2)
Total Protein: 8.4 g/dL — ABNORMAL HIGH (ref 6.5–8.1)

## 2021-07-26 LAB — CBC
HCT: 42 % (ref 39.0–52.0)
Hemoglobin: 14.2 g/dL (ref 13.0–17.0)
MCH: 32.6 pg (ref 26.0–34.0)
MCHC: 33.8 g/dL (ref 30.0–36.0)
MCV: 96.6 fL (ref 80.0–100.0)
Platelets: 295 10*3/uL (ref 150–400)
RBC: 4.35 MIL/uL (ref 4.22–5.81)
RDW: 13.7 % (ref 11.5–15.5)
WBC: 7.8 10*3/uL (ref 4.0–10.5)
nRBC: 0 % (ref 0.0–0.2)

## 2021-07-26 LAB — TYPE AND SCREEN
ABO/RH(D): A POS
Antibody Screen: NEGATIVE

## 2021-07-26 MED ORDER — IOHEXOL 350 MG/ML SOLN
80.0000 mL | Freq: Once | INTRAVENOUS | Status: AC | PRN
  Administered 2021-07-26: 80 mL via INTRAVENOUS

## 2021-07-26 MED ORDER — SODIUM CHLORIDE 0.9 % IV BOLUS
500.0000 mL | Freq: Once | INTRAVENOUS | Status: AC
  Administered 2021-07-26: 500 mL via INTRAVENOUS

## 2021-07-26 NOTE — ED Triage Notes (Signed)
Patient is complaining of having blood in stool and patient states he started two hours ago.

## 2021-07-26 NOTE — Discharge Instructions (Addendum)
Begin using Metamucil: 1 heaping teaspoon in a glass of water 3 times daily.  Begin taking Colace 100 mg twice daily.  This medication is available over-the-counter.  Follow-up with gastroenterology in the next week.  The contact information for [facility] gastroenterology has been provided in this discharge summary for you to call and make these arrangements.  Return to the emergency department if you develop worsening bleeding, severe abdominal pain, lightheadedness/dizziness, or other new and concerning symptoms.

## 2021-07-26 NOTE — ED Provider Notes (Signed)
[facility]-EMERGENCY DEPT Provider Note   CSN: [id number] Arrival date & time: 07/26/21  0155     History Chief Complaint  Patient presents with   Blood In Stools    William Powers is a 40 y.o. male.  Patient is a 40 year old male with history of HIV disease, hypertension, and recently diagnosed pulmonary embolism taking Xarelto.  Patient presenting today with complaints of left lower abdominal pain and blood in his stool.  This started approximately 2 hours ago.  He has had 2 episodes of this.  He denies any fevers or chills.  He denies any lightheadedness or shortness of breath.  He denies any history of hemorrhoids, rectal pain, or straining with stools.  He tells me this has never happened before.  The history is provided by the patient.      Past Medical History:  Diagnosis Date   HFrEF (heart failure with reduced ejection fraction) (HCC)    15% EF as of 2019   HIV (human immunodeficiency virus infection) (HCC)    Hypertension    Polysubstance abuse [facility])     Patient Active Problem List   Diagnosis Date Noted   Single subsegmental pulmonary embolism without acute cor pulmonale (HCC) 07/23/2021   Chronic HFrEF (heart failure with reduced ejection fraction) (HCC) 07/23/2021   HIV (human immunodeficiency virus infection) (HCC) 07/23/2021    Past Surgical History:  Procedure Laterality Date   HERNIA REPAIR         Family History  Problem Relation Age of Onset   Lupus Mother    HIV Mother    HIV Father     Social History   Tobacco Use   Smoking status: Former    Packs/day: 2.00    Types: Cigarettes   Smokeless tobacco: Never  Vaping Use   Vaping Use: Never used  Substance Use Topics   Alcohol use: Not Currently    Comment: None since 2019   Drug use: Not Currently    Types: Marijuana    Home Medications Prior to Admission medications   Medication Sig Start Date End Date Taking? Authorizing Provider  BIKTARVY 50-200-25 MG TABS  tablet Take 1 tablet by mouth daily. 06/12/21   [provider]  eplerenone (INSPRA) 25 MG tablet Take 25 mg by mouth daily. 06/13/20 04/10/22  [provider]  furosemide (LASIX) 40 MG tablet Take 60 mg by mouth daily. 03/26/20 05/10/22  [provider]  metoprolol succinate (TOPROL-XL) 50 MG 24 hr tablet Take 50 mg by mouth 2 (two) times daily. 05/12/21   [provider]  rivaroxaban (XARELTO) 20 MG TABS tablet Take 1 tablet (20 mg total) by mouth daily with supper. Please start this prescription after you have completed the starter pack. 08/13/21   Rai, Delene Ruffini, MD  RIVAROXABAN Carlena Hurl) VTE STARTER PACK (15 & 20 MG) Follow package directions: Take one 15mg  tablet by mouth twice a day. On day 22 (08/13/2021), switch to one 20mg  tablet once a day. Take with food. 07/24/21   Rai, , MD    Allergies    Bee venom, Lisinopril, and Sulfamethoxazole-trimethoprim  Review of Systems   Review of Systems  All other systems reviewed and are negative.  Physical Exam Updated Vital Signs BP 122/68   Pulse 81   Temp 98.3 F (36.8 C)   Resp 16   Ht 6\' 3"  (1.905 m)   Wt 113.4 kg   SpO2 96%   BMI 31.25 kg/m   Physical Exam  Vitals and nursing note reviewed.  Constitutional:      General: He is not in acute distress.    Appearance: He is well-developed. He is not diaphoretic.  HENT:     Head: Normocephalic and atraumatic.  Cardiovascular:     Rate and Rhythm: Normal rate and regular rhythm.     Heart sounds: No murmur heard.   No friction rub.  Pulmonary:     Effort: Pulmonary effort is normal. No respiratory distress.     Breath sounds: Normal breath sounds. No wheezing or rales.  Abdominal:     General: Bowel sounds are normal. There is no distension.     Palpations: Abdomen is soft.     Tenderness: There is abdominal tenderness. There is no right CVA tenderness, left CVA tenderness, guarding or rebound.     Comments: There is tenderness to  palpation in the left lower quadrant.  Musculoskeletal:        General: Normal range of motion.     Cervical back: Normal range of motion and neck supple.  Skin:    General: Skin is warm and dry.  Neurological:     Mental Status: He is alert and oriented to person, place, and time.     Coordination: Coordination normal.    ED Results / Procedures / Treatments   Labs (all labs ordered are listed, but only abnormal results are displayed) Labs Reviewed  COMPREHENSIVE METABOLIC PANEL - Abnormal; Notable for the following components:      Result Value   Glucose, Bld 102 (*)    Total Protein 8.4 (*)    All other components within normal limits  URINALYSIS, ROUTINE W REFLEX MICROSCOPIC - Abnormal; Notable for the following components:   Hgb urine dipstick SMALL (*)    Leukocytes,Ua TRACE (*)    All other components within normal limits  CBC  POC OCCULT BLOOD, ED  TYPE AND SCREEN  ABO/RH    EKG None  Radiology No results found.  Procedures Procedures   Medications Ordered in ED Medications  sodium chloride 0.9 % bolus 500 mL (has no administration in time range)    ED Course  I have reviewed the triage vital signs and the nursing notes.  Pertinent labs & imaging results that were available during my care of the patient were reviewed by me and considered in my medical decision making (see chart for details).    MDM Rules/Calculators/A&P  Patient presenting here with complaints of rectal bleeding and left lower quadrant pain.  He was just admitted recently for pulmonary embolism and started on Xarelto.  Patient's vital signs are stable and hemoglobin is 14.2.  CT scan here today is negative for diverticulitis or colitis or other acute process.  He had 1 episode of this prior to coming here and has not had any further episodes since.  When returning to the patient's room to discuss his results, he was sound asleep with a blanket over his head.  I tried on multiple occasions  to discuss the results with him, however he repeatedly went back to sleep and pulled the blanket over his head.  At this point, I informed him we would be discharging him.  I will recommend he take stool softeners and continue his Xarelto as he was recently diagnosed with PE.  If his bleeding worsens, he is to return to the ER to be reevaluated.  I suspect he may have some sort of fissure or internal hemorrhoid, but he does not appear  to be actively bleeding.  Final Clinical Impression(s) / ED Diagnoses Final diagnoses:  None    Rx / DC Orders ED Discharge Orders     None        Geoffery Lyons, MD 07/26/21 [phone]

## 2021-07-28 ENCOUNTER — Encounter (HOSPITAL_COMMUNITY): Payer: Self-pay | Admitting: Emergency Medicine

## 2021-07-28 ENCOUNTER — Emergency Department (HOSPITAL_COMMUNITY)
Admission: EM | Admit: 2021-07-28 | Discharge: 2021-07-28 | Disposition: A | Discharge status: Home / Self Care | Payer: Medicaid Other | Attending: Emergency Medicine | Admitting: Emergency Medicine

## 2021-07-28 DIAGNOSIS — I509 Heart failure, unspecified: Secondary | ICD-10-CM | POA: Insufficient documentation

## 2021-07-28 DIAGNOSIS — I110 Hypertensive heart disease with heart failure: Secondary | ICD-10-CM | POA: Insufficient documentation

## 2021-07-28 DIAGNOSIS — Z87891 Personal history of nicotine dependence: Secondary | ICD-10-CM | POA: Diagnosis not present

## 2021-07-28 DIAGNOSIS — Z21 Asymptomatic human immunodeficiency virus [HIV] infection status: Secondary | ICD-10-CM | POA: Insufficient documentation

## 2021-07-28 DIAGNOSIS — R1084 Generalized abdominal pain: Secondary | ICD-10-CM | POA: Diagnosis not present

## 2021-07-28 DIAGNOSIS — Z79899 Other long term (current) drug therapy: Secondary | ICD-10-CM | POA: Diagnosis not present

## 2021-07-28 DIAGNOSIS — Z7901 Long term (current) use of anticoagulants: Secondary | ICD-10-CM | POA: Insufficient documentation

## 2021-07-28 DIAGNOSIS — R109 Unspecified abdominal pain: Secondary | ICD-10-CM | POA: Diagnosis present

## 2021-07-28 LAB — COMPREHENSIVE METABOLIC PANEL
ALT: 34 U/L (ref 0–44)
AST: 32 U/L (ref 15–41)
Albumin: 4.4 g/dL (ref 3.5–5.0)
Alkaline Phosphatase: 50 U/L (ref 38–126)
Anion gap: 9 (ref 5–15)
BUN: 13 mg/dL (ref 6–20)
CO2: 27 mmol/L (ref 22–32)
Calcium: 9.3 mg/dL (ref 8.9–10.3)
Chloride: 108 mmol/L (ref 98–111)
Creatinine, Ser: 1.48 mg/dL — ABNORMAL HIGH (ref 0.61–1.24)
GFR, Estimated: >60 mL/min (ref >60)
Glucose, Bld: 115 mg/dL — ABNORMAL HIGH (ref 70–99)
Potassium: 3.6 mmol/L (ref 3.5–5.1)
Sodium: 144 mmol/L (ref 135–145)
Total Bilirubin: 0.5 mg/dL (ref 0.3–1.2)
Total Protein: 8.5 g/dL — ABNORMAL HIGH (ref 6.5–8.1)

## 2021-07-28 LAB — CBC
HCT: 42.3 % (ref 39.0–52.0)
Hemoglobin: 14.1 g/dL (ref 13.0–17.0)
MCH: 32.3 pg (ref 26.0–34.0)
MCHC: 33.3 g/dL (ref 30.0–36.0)
MCV: 97 fL (ref 80.0–100.0)
Platelets: 322 10*3/uL (ref 150–400)
RBC: 4.36 MIL/uL (ref 4.22–5.81)
RDW: 13.4 % (ref 11.5–15.5)
WBC: 7 10*3/uL (ref 4.0–10.5)
nRBC: 0 % (ref 0.0–0.2)

## 2021-07-28 LAB — TYPE AND SCREEN
ABO/RH(D): A POS
Antibody Screen: NEGATIVE

## 2021-07-28 NOTE — ED Triage Notes (Addendum)
Pt c/o bright red bloody stools x 2 days. Pt recently started on xarelto. Pt seen for same on 9/27. VSS.

## 2021-07-28 NOTE — ED Notes (Signed)
Pt ambulated out of the lobby in no distress. Declined recheck of vitals.

## 2021-07-28 NOTE — ED Provider Notes (Signed)
[facility]-EMERGENCY DEPT Provider Note   CSN: [id number] Arrival date & time: 07/28/21  0200     History Chief Complaint  Patient presents with   Rectal Bleeding    William Powers is a 40 y.o. male.  Patient presents to the emergency department with a chief complaint of "stomach problems."  During my history, he does not specify anything additional, and keeps pulling the covers over his head to go to sleep.  He doesn't contribute any additional history.  Nursing not comments on some rectal bleeding and recently starting eliquis for PE.  The history is provided by the patient. No language interpreter was used.      Past Medical History:  Diagnosis Date   HFrEF (heart failure with reduced ejection fraction) (HCC)    15% EF as of 2019   HIV (human immunodeficiency virus infection) (HCC)    Hypertension    Polysubstance abuse [employer])     Patient Active Problem List   Diagnosis Date Noted   Single subsegmental pulmonary embolism without acute cor pulmonale (HCC) 07/23/2021   Chronic HFrEF (heart failure with reduced ejection fraction) (HCC) 07/23/2021   HIV (human immunodeficiency virus infection) (HCC) 07/23/2021    Past Surgical History:  Procedure Laterality Date   HERNIA REPAIR         Family History  Problem Relation Age of Onset   Lupus Mother    HIV Mother    HIV Father     Social History   Tobacco Use   Smoking status: Former    Packs/day: 2.00    Types: Cigarettes   Smokeless tobacco: Never  Vaping Use   Vaping Use: Never used  Substance Use Topics   Alcohol use: Not Currently    Comment: None since 2019   Drug use: Not Currently    Types: Marijuana    Home Medications Prior to Admission medications   Medication Sig Start Date End Date Taking? Authorizing Provider  BIKTARVY 50-200-25 MG TABS tablet Take 1 tablet by mouth daily. 06/12/21   [provider]  eplerenone (INSPRA) 25 MG tablet Take 25 mg by mouth daily.  06/13/20 04/10/22  [provider]  furosemide (LASIX) 40 MG tablet Take 60 mg by mouth daily. 03/26/20 05/10/22  [provider]  metoprolol succinate (TOPROL-XL) 50 MG 24 hr tablet Take 50 mg by mouth 2 (two) times daily. 05/12/21   [provider]  rivaroxaban (XARELTO) 20 MG TABS tablet Take 1 tablet (20 mg total) by mouth daily with supper. Please start this prescription after you have completed the starter pack. 08/13/21   Rai, Delene Ruffini, MD  RIVAROXABAN Carlena Hurl) VTE STARTER PACK (15 & 20 MG) Follow package directions: Take one 15mg  tablet by mouth twice a day. On day 22 (08/13/2021), switch to one 20mg  tablet once a day. Take with food. 07/24/21   Rai, , MD    Allergies    Bee venom, Lisinopril, and Sulfamethoxazole-trimethoprim  Review of Systems   Review of Systems  All other systems reviewed and are negative.  Physical Exam Updated Vital Signs BP 115/73 (BP Location: Right Arm)   Pulse 83   Temp 97.8 F (36.6 C) (Oral)   Resp 20   Ht 6\' 3"  (1.905 m)   Wt 113.4 kg   SpO2 95%   BMI 31.25 kg/m   Physical Exam Vitals and nursing note reviewed.  Constitutional:      General: He is not in acute distress.  Appearance: He is well-developed. He is not ill-appearing.  HENT:     Head: Normocephalic and atraumatic.  Eyes:     Conjunctiva/sclera: Conjunctivae normal.  Cardiovascular:     Rate and Rhythm: Normal rate.  Pulmonary:     Effort: Pulmonary effort is normal. No respiratory distress.  Abdominal:     General: There is no distension.     Comments: No focal abdominal tenderness, no RLQ tenderness or pain at McBurney's point, no RUQ tenderness or Murphy's sign, no left-sided abdominal tenderness, no fluid wave, or signs of peritonitis   Musculoskeletal:     Cervical back: Neck supple.     Comments: Moves all extremities  Skin:    General: Skin is warm and dry.  Neurological:     Mental Status: He is alert and oriented to person,  place, and time.  Psychiatric:        Mood and Affect: Mood normal.        Behavior: Behavior normal.    ED Results / Procedures / Treatments   Labs (all labs ordered are listed, but only abnormal results are displayed) Labs Reviewed  COMPREHENSIVE METABOLIC PANEL - Abnormal; Notable for the following components:      Result Value   Glucose, Bld 115 (*)    Creatinine, Ser 1.48 (*)    Total Protein 8.5 (*)    All other components within normal limits  CBC  POC OCCULT BLOOD, ED  TYPE AND SCREEN    EKG None  Radiology CT ABDOMEN PELVIS W CONTRAST  Result Date: 07/26/2021 CLINICAL DATA:  40 year old male with history of blood in the stool. Suspected abdominal abscess or infection. EXAM: CT ABDOMEN AND PELVIS WITH CONTRAST TECHNIQUE: Multidetector CT imaging of the abdomen and pelvis was performed using the standard protocol following bolus administration of intravenous contrast. CONTRAST:  30mL OMNIPAQUE IOHEXOL 350 MG/ML SOLN COMPARISON:  CT the abdomen and pelvis 07/16/2021. FINDINGS: Lower chest: Unremarkable. Hepatobiliary: No suspicious cystic or solid hepatic lesions. No intra or extrahepatic biliary ductal dilatation. Gallbladder is normal in appearance. Pancreas: No pancreatic mass. No pancreatic ductal dilatation. No pancreatic or peripancreatic fluid collections or inflammatory changes. Spleen: Unremarkable. Adrenals/Urinary Tract: Patchy multifocal cortical thinning in the interpolar and lower pole regions of the left kidney, presumably chronic scarring from prior infection or infarction. Right kidney and bilateral adrenal glands are otherwise normal in appearance. No hydroureteronephrosis. Urinary bladder is normal in appearance. Stomach/Bowel: The appearance of the stomach is normal. There is no pathologic dilatation of small bowel or colon. Normal appendix. Vascular/Lymphatic: No significant atherosclerotic disease, aneurysm or dissection noted in the abdominal or pelvic  vasculature. No lymphadenopathy noted in the abdomen or pelvis. Reproductive: Prostate gland and seminal vesicles are unremarkable in appearance. Other: No significant volume of ascites.  No pneumoperitoneum. Musculoskeletal: There are no aggressive appearing lytic or blastic lesions noted in the visualized portions of the skeleton. IMPRESSION: 1. No acute findings are noted in the abdomen or pelvis to account for the patient's symptoms. 2. Chronic scarring in the interpolar and lower pole region of the left kidney, similar to the prior study. Electronically Signed   By: Trudie Reed M.D.   On: 07/26/2021 05:32    Procedures Procedures   Medications Ordered in ED Medications - No data to display  ED Course  I have reviewed the triage vital signs and the nursing notes.  Pertinent labs & imaging results that were available during my care of the patient were reviewed by me  and considered in my medical decision making (see chart for details).    MDM Rules/Calculators/A&P                           Patient here with reported abdominal pain, although he is not able to clarify what he means by this.  He consistently pulls the covers over his head and attempts to sleep during my history.  He is afebrile.  Vital signs are stable.  Laboratory work-up is reassuring.  His hemoglobin is normal.  Her recent CT scan from 10/7 is reviewed and shows no acute intra-abdominal process.  Patient appears stable for discharge and outpatient work-up. Final Clinical Impression(s) / ED Diagnoses Final diagnoses:  Generalized abdominal pain    Rx / DC Orders ED Discharge Orders     None        Roxy Horseman, PA-C 07/28/21 0414    [name], MD 07/28/21 [phone]

## 2021-07-29 ENCOUNTER — Other Ambulatory Visit: Payer: Self-pay

## 2021-07-29 DIAGNOSIS — K921 Melena: Secondary | ICD-10-CM | POA: Insufficient documentation

## 2021-07-29 DIAGNOSIS — Z21 Asymptomatic human immunodeficiency virus [HIV] infection status: Secondary | ICD-10-CM | POA: Diagnosis not present

## 2021-07-29 DIAGNOSIS — Z79899 Other long term (current) drug therapy: Secondary | ICD-10-CM | POA: Insufficient documentation

## 2021-07-29 DIAGNOSIS — I110 Hypertensive heart disease with heart failure: Secondary | ICD-10-CM | POA: Diagnosis not present

## 2021-07-29 DIAGNOSIS — Z87891 Personal history of nicotine dependence: Secondary | ICD-10-CM | POA: Diagnosis not present

## 2021-07-29 DIAGNOSIS — I509 Heart failure, unspecified: Secondary | ICD-10-CM | POA: Diagnosis not present

## 2021-07-29 DIAGNOSIS — Z7901 Long term (current) use of anticoagulants: Secondary | ICD-10-CM | POA: Diagnosis not present

## 2021-07-30 ENCOUNTER — Other Ambulatory Visit: Payer: Self-pay

## 2021-07-30 ENCOUNTER — Emergency Department (HOSPITAL_COMMUNITY)
Admission: EM | Admit: 2021-07-30 | Discharge: 2021-07-30 | Disposition: A | Discharge status: Home / Self Care | Payer: Medicaid Other | Attending: Emergency Medicine | Admitting: Emergency Medicine

## 2021-07-30 ENCOUNTER — Encounter (HOSPITAL_COMMUNITY): Payer: Self-pay | Admitting: Emergency Medicine

## 2021-07-30 DIAGNOSIS — K921 Melena: Secondary | ICD-10-CM

## 2021-07-30 HISTORY — DX: Heart failure, unspecified: I50.9

## 2021-07-30 LAB — CBC WITH DIFFERENTIAL/PLATELET
Abs Immature Granulocytes: 0.02 10*3/uL (ref 0.00–0.07)
Basophils Absolute: 0.1 10*3/uL (ref 0.0–0.1)
Basophils Relative: 1 %
Eosinophils Absolute: 0.2 10*3/uL (ref 0.0–0.5)
Eosinophils Relative: 3 %
HCT: 42.1 % (ref 39.0–52.0)
Hemoglobin: 14.1 g/dL (ref 13.0–17.0)
Immature Granulocytes: 0 %
Lymphocytes Relative: 41 %
Lymphs Abs: 2.8 10*3/uL (ref 0.7–4.0)
MCH: 32.4 pg (ref 26.0–34.0)
MCHC: 33.5 g/dL (ref 30.0–36.0)
MCV: 96.8 fL (ref 80.0–100.0)
Monocytes Absolute: 0.7 10*3/uL (ref 0.1–1.0)
Monocytes Relative: 11 %
Neutro Abs: 3 10*3/uL (ref 1.7–7.7)
Neutrophils Relative %: 44 %
Platelets: 304 10*3/uL (ref 150–400)
RBC: 4.35 MIL/uL (ref 4.22–5.81)
RDW: 13.4 % (ref 11.5–15.5)
WBC: 6.8 10*3/uL (ref 4.0–10.5)
nRBC: 0 % (ref 0.0–0.2)

## 2021-07-30 LAB — COMPREHENSIVE METABOLIC PANEL
ALT: 30 U/L (ref 0–44)
AST: 28 U/L (ref 15–41)
Albumin: 3.8 g/dL (ref 3.5–5.0)
Alkaline Phosphatase: 47 U/L (ref 38–126)
Anion gap: 9 (ref 5–15)
BUN: 14 mg/dL (ref 6–20)
CO2: 24 mmol/L (ref 22–32)
Calcium: 9.2 mg/dL (ref 8.9–10.3)
Chloride: 105 mmol/L (ref 98–111)
Creatinine, Ser: 1.42 mg/dL — ABNORMAL HIGH (ref 0.61–1.24)
GFR, Estimated: >60 mL/min (ref >60)
Glucose, Bld: 99 mg/dL (ref 70–99)
Potassium: 3.7 mmol/L (ref 3.5–5.1)
Sodium: 138 mmol/L (ref 135–145)
Total Bilirubin: 0.8 mg/dL (ref 0.3–1.2)
Total Protein: 7.3 g/dL (ref 6.5–8.1)

## 2021-07-30 LAB — URINALYSIS, ROUTINE W REFLEX MICROSCOPIC
Bacteria, UA: NONE SEEN
Bilirubin Urine: NEGATIVE
Glucose, UA: NEGATIVE mg/dL
Ketones, ur: NEGATIVE mg/dL
Leukocytes,Ua: NEGATIVE
Nitrite: NEGATIVE
Protein, ur: NEGATIVE mg/dL
Specific Gravity, Urine: 1.021 (ref 1.005–1.030)
pH: 5 (ref 5.0–8.0)

## 2021-07-30 LAB — HEMOGLOBIN AND HEMATOCRIT, BLOOD
HCT: 43.5 % (ref 39.0–52.0)
Hemoglobin: 14.2 g/dL (ref 13.0–17.0)

## 2021-07-30 LAB — LIPASE, BLOOD: Lipase: 41 U/L (ref 11–51)

## 2021-07-30 MED ORDER — FERROUS SULFATE 325 (65 FE) MG PO TABS: 325.0000 mg | ORAL_TABLET | Freq: Every day | ORAL | 2 refills | Status: DC

## 2021-07-30 MED ORDER — DOCUSATE SODIUM 100 MG PO CAPS: 100.0000 mg | ORAL_CAPSULE | Freq: Every day | ORAL | 1 refills | Status: DC | PRN

## 2021-07-30 MED ORDER — RIVAROXABAN 15 MG PO TABS
15.0000 mg | ORAL_TABLET | Freq: Two times a day (BID) | ORAL | Status: DC
  Administered 2021-07-30: 15 mg via ORAL
  Filled 2021-07-30: qty 1

## 2021-07-30 NOTE — ED Notes (Signed)
Blood drawn via 23 gauge butterfly from R wrist. Pt tolerated well

## 2021-07-30 NOTE — ED Provider Notes (Signed)
[facility] EMERGENCY DEPARTMENT Provider Note   CSN: [id number] Arrival date & time: 07/29/21  2245     History Chief Complaint  Patient presents with   Black Stools / Takes Xarelto    William Powers is a 40 y.o. male with history hypertension, HIV (reports undetectable viral load and compliance with antiviral medicines), congestive heart failure, pulmonary embolism, on Xarelto, presented ED with black and tarry stool.  Patient reports that he was initiated on Xarelto earlier this month for newly diagnosed pulmonary embolism.  He states he has noticed both bright red blood and dark stool with several recent bowel movements.  He was seen in the emergency department approximately 4 days ago with similar complaints.  At that time a CT scan of the abdomen showed no acute findings, no acute diverticulosis, was noted to have a stable hemoglobin and was discharged home.  He has not seen a gastroenterologist or had an endoscopy or colonoscopy.  He reports he is continuing to have dark appearing stool.  He denies lightheadedness.  He denies any ongoing abdominal cramping or pain.  He denies any rectal pain.  He reports he does have a history of some bright red blood when he wiped in the past.  He reports compliance with his medications including the Xarelto, but has not taken his morning dose yet.  He did have an approximate 10-hour wait in the waiting room last night prior to being roomed and evaluated by myself this morning.  HPI     Past Medical History:  Diagnosis Date   Heart failure (HCC)    HFrEF (heart failure with reduced ejection fraction) (HCC)    15% EF as of 2019   HIV (human immunodeficiency virus infection) (HCC)    Hypertension    Polysubstance abuse [facility])     Patient Active Problem List   Diagnosis Date Noted   Single subsegmental pulmonary embolism without acute cor pulmonale (HCC) 07/23/2021   Chronic HFrEF (heart failure with reduced ejection fraction)  (HCC) 07/23/2021   HIV (human immunodeficiency virus infection) (HCC) 07/23/2021    Past Surgical History:  Procedure Laterality Date   HERNIA REPAIR         Family History  Problem Relation Age of Onset   Lupus Mother    HIV Mother    HIV Father     Social History   Tobacco Use   Smoking status: Former    Packs/day: 2.00    Types: Cigarettes   Smokeless tobacco: Never  Vaping Use   Vaping Use: Never used  Substance Use Topics   Alcohol use: Not Currently    Comment: None since 2019   Drug use: Not Currently    Types: Marijuana    Home Medications Prior to Admission medications   Medication Sig Start Date End Date Taking? Authorizing Provider  docusate sodium (COLACE) 100 MG capsule Take 1 capsule (100 mg total) by mouth daily as needed for up to 60 doses for mild constipation. Take with iron to prevent constipation 07/30/21  Yes Neeraj Housand, Kermit Balo, MD  ferrous sulfate 325 (65 FE) MG tablet Take 1 tablet (325 mg total) by mouth daily. 07/30/21 08/29/21 Yes Delsin Copen, Kermit Balo, MD  BIKTARVY 50-200-25 MG TABS tablet Take 1 tablet by mouth daily. 06/12/21   [provider]  eplerenone (INSPRA) 25 MG tablet Take 25 mg by mouth daily. 06/13/20 04/10/22  [provider]  furosemide (LASIX) 40 MG tablet Take 60 mg by mouth daily. 03/26/20  05/10/22  [provider]  metoprolol succinate (TOPROL-XL) 50 MG 24 hr tablet Take 50 mg by mouth 2 (two) times daily. 05/12/21   [provider]  rivaroxaban (XARELTO) 20 MG TABS tablet Take 1 tablet (20 mg total) by mouth daily with supper. Please start this prescription after you have completed the starter pack. 08/13/21   Rai, Delene Ruffini, MD  RIVAROXABAN Carlena Hurl) VTE STARTER PACK (15 & 20 MG) Follow package directions: Take one 15mg  tablet by mouth twice a day. On day 22 (08/13/2021), switch to one 20mg  tablet once a day. Take with food. 07/24/21   Rai, , MD    Allergies    Bee venom, Lisinopril, and  Sulfamethoxazole-trimethoprim  Review of Systems   Review of Systems  Constitutional:  Negative for chills and fever.  Respiratory:  Negative for cough and shortness of breath.   Cardiovascular:  Negative for chest pain and palpitations.  Gastrointestinal:  Positive for blood in stool. Negative for abdominal pain, diarrhea, nausea and vomiting.  Genitourinary:  Positive for frequency. Negative for dysuria and hematuria.  Musculoskeletal:  Negative for arthralgias and back pain.  Skin:  Negative for color change and rash.  Neurological:  Negative for syncope.  All other systems reviewed and are negative.  Physical Exam Updated Vital Signs BP 136/88 (BP Location: Right Arm)   Pulse 95   Temp 98.9 F (37.2 C) (Oral)   Resp 18   Ht 6\' 4"  (1.93 m)   Wt 125 kg   SpO2 98%   BMI 33.54 kg/m   Physical Exam Constitutional:      General: He is not in acute distress. HENT:     Head: Normocephalic and atraumatic.  Eyes:     Conjunctiva/sclera: Conjunctivae normal.     Pupils: Pupils are equal, round, and reactive to light.  Cardiovascular:     Rate and Rhythm: Normal rate and regular rhythm.  Pulmonary:     Effort: Pulmonary effort is normal. No respiratory distress.  Abdominal:     General: There is no distension.     Tenderness: There is no abdominal tenderness.  Genitourinary:    Comments: External rectal exam with no visible bleeding or hemorrhoids Patient refused digital/internal rectal exam Skin:    General: Skin is warm and dry.  Neurological:     General: No focal deficit present.     Mental Status: He is alert and oriented to person, place, and time. Mental status is at baseline.  Psychiatric:        Mood and Affect: Mood normal.        Behavior: Behavior normal.    ED Results / Procedures / Treatments   Labs (all labs ordered are listed, but only abnormal results are displayed) Labs Reviewed  COMPREHENSIVE METABOLIC PANEL - Abnormal; Notable for the following  components:      Result Value   Creatinine, Ser 1.42 (*)    All other components within normal limits  URINALYSIS, ROUTINE W REFLEX MICROSCOPIC - Abnormal; Notable for the following components:   Hgb urine dipstick SMALL (*)    All other components within normal limits  CBC WITH DIFFERENTIAL/PLATELET  LIPASE, BLOOD  HEMOGLOBIN AND HEMATOCRIT, BLOOD    EKG None  Radiology No results found.  Procedures Procedures   Medications Ordered in ED Medications - No data to display  ED Course  I have reviewed the triage vital signs and the nursing notes.  Pertinent labs & imaging results that were available during  my care of the patient were reviewed by me and considered in my medical decision making (see chart for details).  Patient is here with blood in his stool for several days.  He is on Xarelto and compliant.  On my exam the patient appears stable, overall clinically well-appearing, does not appear acutely anemic.  There is no abdominal tenderness to suggest acute diverticulitis.  I personally reviewed the patient's lab work.  Hemoglobin is normal here, stable from is checked the past few days.  Because he has been waiting approximately 10 hours in the waiting room, I think is reasonable to recheck an H&H.  If it is stable I suspect he can be discharged with iron tablets and GI follow up.  He would not allow me to do an internal rectal exam, so I am not clear whether he may have internal hemorrhoids or whether this is truly melena.  Lowers his BUN is also within normal limits.  I doubt this is an acute upper GI bleed.  He reports no recent heavy alcohol use or epigastric pain to suspect bleeding gastric ulcer.  I would advise that he maintain his Xarelto at home given his risk for recurring pulmonary embolism or clotting.  Clinical Course as of 07/30/21 1745  Tue Jul 30, 2021  1027 Repeat hemoglobin is stable.  Vital signs stable.  On reassessment the patient is still  well-appearing.  I advised that he continue his xarelto, will prescribe iron with a stool softener, and refer to GI [MT]    Clinical Course User Index [MT] Lus Kriegel, Kermit Balo, MD   Final Clinical Impression(s) / ED Diagnoses Final diagnoses:  Blood in stool    Rx / DC Orders ED Discharge Orders          Ordered    Ambulatory referral to Gastroenterology       Comments: Patient with blood in stool after starting xarelto for PE; may require endoscopy/colonoscopy evaluation   07/30/21 1029    ferrous sulfate 325 (65 FE) MG tablet  Daily        07/30/21 1035    docusate sodium (COLACE) 100 MG capsule  Daily PRN        07/30/21 1035             Terald Sleeper, MD 07/30/21 1745

## 2021-07-30 NOTE — ED Triage Notes (Signed)
Patient reports black/red stools onset last night , no emesis or fever , he takes Xarelto.

## 2021-07-30 NOTE — Discharge Instructions (Addendum)
Please continue taking your Xarelto as scheduled.  This medicine is important to prevent more blood clots, which can be life-threatening.    Your blood counts today were stable.  I do not see signs of significant bleeding or blood loss on your repeat labs today.   I started you on iron, which will help your body make more blood.  Iron can cause some constipation issues.  If you do feel that you are getting constipated, you can start taking a stool softener every day with the iron pill.  Please be aware that iron can also make your stool look dark/black.

## 2021-07-30 NOTE — ED Provider Notes (Signed)
MSE was initiated and I personally evaluated the patient and placed orders (if any) at  1:41 AM on July 30, 2021.  Patient anticoagulated on Xarelto for PE here with persistent melena, abdominal pain, fatigue. Seen on 10/7 and 10/9 for same. "Worse" now. No fever.   Today's Vitals   07/30/21 0128  BP: 100/67  Pulse: 93  Resp: 16  Temp: 97.9 F (36.6 C)  TempSrc: Oral  SpO2: 97%   There is no height or weight on file to calculate BMI.  In NAD RRR Lungs clear Soft abdomen  The patient appears stable so that the remainder of the MSE may be completed by another provider.   Elpidio Anis, PA-C 07/30/21 0142    [name], MD 07/30/21 [phone]

## 2021-07-31 ENCOUNTER — Encounter (HOSPITAL_COMMUNITY): Payer: Self-pay | Admitting: Emergency Medicine

## 2021-07-31 ENCOUNTER — Other Ambulatory Visit: Payer: Self-pay

## 2021-07-31 ENCOUNTER — Emergency Department (HOSPITAL_COMMUNITY): Payer: Medicaid Other

## 2021-07-31 ENCOUNTER — Emergency Department (HOSPITAL_COMMUNITY)
Admission: EM | Admit: 2021-07-31 | Discharge: 2021-07-31 | Disposition: A | Discharge status: Home / Self Care | Payer: Medicaid Other | Attending: Emergency Medicine | Admitting: Emergency Medicine

## 2021-07-31 DIAGNOSIS — Z5321 Procedure and treatment not carried out due to patient leaving prior to being seen by health care provider: Secondary | ICD-10-CM | POA: Diagnosis not present

## 2021-07-31 DIAGNOSIS — R0602 Shortness of breath: Secondary | ICD-10-CM | POA: Insufficient documentation

## 2021-07-31 DIAGNOSIS — R079 Chest pain, unspecified: Secondary | ICD-10-CM | POA: Insufficient documentation

## 2021-07-31 LAB — CBC WITH DIFFERENTIAL/PLATELET
Abs Immature Granulocytes: 0.02 10*3/uL (ref 0.00–0.07)
Basophils Absolute: 0.1 10*3/uL (ref 0.0–0.1)
Basophils Relative: 1 %
Eosinophils Absolute: 0.1 10*3/uL (ref 0.0–0.5)
Eosinophils Relative: 1 %
HCT: 41.9 % (ref 39.0–52.0)
Hemoglobin: 14 g/dL (ref 13.0–17.0)
Immature Granulocytes: 0 %
Lymphocytes Relative: 32 %
Lymphs Abs: 2.4 10*3/uL (ref 0.7–4.0)
MCH: 32.3 pg (ref 26.0–34.0)
MCHC: 33.4 g/dL (ref 30.0–36.0)
MCV: 96.8 fL (ref 80.0–100.0)
Monocytes Absolute: 0.6 10*3/uL (ref 0.1–1.0)
Monocytes Relative: 8 %
Neutro Abs: 4.3 10*3/uL (ref 1.7–7.7)
Neutrophils Relative %: 58 %
Platelets: 309 10*3/uL (ref 150–400)
RBC: 4.33 MIL/uL (ref 4.22–5.81)
RDW: 13.4 % (ref 11.5–15.5)
WBC: 7.4 10*3/uL (ref 4.0–10.5)
nRBC: 0 % (ref 0.0–0.2)

## 2021-07-31 LAB — TROPONIN I (HIGH SENSITIVITY)
Troponin I (High Sensitivity): 5 ng/L (ref <18)
Troponin I (High Sensitivity): 7 ng/L (ref <18)

## 2021-07-31 LAB — BASIC METABOLIC PANEL
Anion gap: 12 (ref 5–15)
BUN: 12 mg/dL (ref 6–20)
CO2: 21 mmol/L — ABNORMAL LOW (ref 22–32)
Calcium: 9.3 mg/dL (ref 8.9–10.3)
Chloride: 103 mmol/L (ref 98–111)
Creatinine, Ser: 1.27 mg/dL — ABNORMAL HIGH (ref 0.61–1.24)
GFR, Estimated: >60 mL/min (ref >60)
Glucose, Bld: 88 mg/dL (ref 70–99)
Potassium: 3.7 mmol/L (ref 3.5–5.1)
Sodium: 136 mmol/L (ref 135–145)

## 2021-07-31 LAB — PROTIME-INR
INR: 1.1 (ref 0.8–1.2)
Prothrombin Time: 14.5 seconds (ref 11.4–15.2)

## 2021-07-31 NOTE — ED Notes (Signed)
Patient states "I am leaving and will call my provider and let him see me. I can not wait any longer."  I explained to pt he couldn't get bus pass without staying entire time to see provider.

## 2021-07-31 NOTE — ED Triage Notes (Signed)
Patient arrived with EMS from street reports mid chest pain this evening with mild SOB , no emesis or diaphoresis . He refused EKG / vital signs from EMS .

## 2021-07-31 NOTE — ED Provider Notes (Signed)
Emergency Medicine Provider Triage Evaluation Note  William Powers , a 40 y.o. male  was evaluated in triage.  Pt complains of chest pain.  The patient reports chest pain in the center of his chest that began at approximately 22:30.  He reports associated shortness of breath.  The pain does not radiate.  He denies fever, chills, vomiting, diaphoresis, leg swelling, but states that he is feeling nauseated.  He was seen in the ER yesterday for black stools, and states that these are getting worse.  He has been compliant with his home Xarelto.  He was recently diagnosed with a PE.  Review of Systems  Positive: Chest pain, shortness of breath, nausea Negative: Leg swelling, vomiting, fever, chills, back pain, numbness, weakness, visual changes  Physical Exam  BP 122/67 (BP Location: Left Arm)   Pulse 88   Temp 97.8 F (36.6 C)   Resp 20   Ht 6\' 3"  (1.905 m)   Wt 125 kg   SpO2 100%   BMI 34.44 kg/m  Gen:   Awake, no distress, falling asleep on exam, but then will rouse to voice easily Resp:  Normal effort, lungs are clear to auscultation bilaterally. MSK:   Moves extremities without difficulty  Other:  Heart is regular rate and rhythm without murmurs rubs or gallops.  Medical Decision Making  Medically screening exam initiated at 2:39 AM.  Appropriate orders placed.  Ghali Morissette was informed that the remainder of the evaluation will be completed by another provider, this initial triage assessment does not replace that evaluation, and the importance of remaining in the ED until their evaluation is complete.  The patient will require further work-up and evaluation in the emergency department.  Labs and imaging have been ordered.   [name] A, PA-C 07/31/21 0300    09/30/21, MD 07/31/21 [phone]

## 2021-08-06 DIAGNOSIS — K625 Hemorrhage of anus and rectum: Principal | ICD-10-CM | POA: Insufficient documentation

## 2021-08-06 DIAGNOSIS — Z87891 Personal history of nicotine dependence: Secondary | ICD-10-CM | POA: Diagnosis not present

## 2021-08-06 DIAGNOSIS — Z7901 Long term (current) use of anticoagulants: Secondary | ICD-10-CM | POA: Insufficient documentation

## 2021-08-06 DIAGNOSIS — I2782 Chronic pulmonary embolism: Secondary | ICD-10-CM | POA: Insufficient documentation

## 2021-08-06 DIAGNOSIS — Z20822 Contact with and (suspected) exposure to covid-19: Secondary | ICD-10-CM | POA: Insufficient documentation

## 2021-08-06 DIAGNOSIS — I5022 Chronic systolic (congestive) heart failure: Secondary | ICD-10-CM | POA: Diagnosis not present

## 2021-08-06 DIAGNOSIS — B20 Human immunodeficiency virus [HIV] disease: Secondary | ICD-10-CM | POA: Diagnosis not present

## 2021-08-06 DIAGNOSIS — Z79899 Other long term (current) drug therapy: Secondary | ICD-10-CM | POA: Diagnosis not present

## 2021-08-06 DIAGNOSIS — I110 Hypertensive heart disease with heart failure: Secondary | ICD-10-CM | POA: Diagnosis not present

## 2021-08-06 DIAGNOSIS — E876 Hypokalemia: Secondary | ICD-10-CM | POA: Diagnosis not present

## 2021-08-07 ENCOUNTER — Observation Stay (HOSPITAL_COMMUNITY)
Admission: EM | Admit: 2021-08-07 | Discharge: 2021-08-07 | Disposition: A | Discharge status: Home / Self Care | Payer: Medicaid Other | Attending: Internal Medicine | Admitting: Internal Medicine

## 2021-08-07 ENCOUNTER — Emergency Department (HOSPITAL_COMMUNITY): Payer: Medicaid Other

## 2021-08-07 ENCOUNTER — Encounter (HOSPITAL_COMMUNITY): Payer: Self-pay

## 2021-08-07 DIAGNOSIS — K625 Hemorrhage of anus and rectum: Principal | ICD-10-CM

## 2021-08-07 DIAGNOSIS — R059 Cough, unspecified: Secondary | ICD-10-CM

## 2021-08-07 DIAGNOSIS — K922 Gastrointestinal hemorrhage, unspecified: Secondary | ICD-10-CM

## 2021-08-07 DIAGNOSIS — Z8679 Personal history of other diseases of the circulatory system: Secondary | ICD-10-CM

## 2021-08-07 DIAGNOSIS — E876 Hypokalemia: Secondary | ICD-10-CM | POA: Diagnosis present

## 2021-08-07 DIAGNOSIS — B20 Human immunodeficiency virus [HIV] disease: Secondary | ICD-10-CM | POA: Diagnosis present

## 2021-08-07 DIAGNOSIS — F1011 Alcohol abuse, in remission: Secondary | ICD-10-CM | POA: Insufficient documentation

## 2021-08-07 DIAGNOSIS — I2693 Single subsegmental pulmonary embolism without acute cor pulmonale: Secondary | ICD-10-CM | POA: Diagnosis present

## 2021-08-07 LAB — CBC WITH DIFFERENTIAL/PLATELET
Abs Immature Granulocytes: 0.02 10*3/uL (ref 0.00–0.07)
Basophils Absolute: 0 10*3/uL (ref 0.0–0.1)
Basophils Relative: 1 %
Eosinophils Absolute: 0.2 10*3/uL (ref 0.0–0.5)
Eosinophils Relative: 4 %
HCT: 41 % (ref 39.0–52.0)
Hemoglobin: 13.6 g/dL (ref 13.0–17.0)
Immature Granulocytes: 0 %
Lymphocytes Relative: 28 %
Lymphs Abs: 1.8 10*3/uL (ref 0.7–4.0)
MCH: 32 pg (ref 26.0–34.0)
MCHC: 33.2 g/dL (ref 30.0–36.0)
MCV: 96.5 fL (ref 80.0–100.0)
Monocytes Absolute: 0.9 10*3/uL (ref 0.1–1.0)
Monocytes Relative: 13 %
Neutro Abs: 3.7 10*3/uL (ref 1.7–7.7)
Neutrophils Relative %: 54 %
Platelets: 292 10*3/uL (ref 150–400)
RBC: 4.25 MIL/uL (ref 4.22–5.81)
RDW: 14 % (ref 11.5–15.5)
WBC: 6.6 10*3/uL (ref 4.0–10.5)
nRBC: 0 % (ref 0.0–0.2)

## 2021-08-07 LAB — COMPREHENSIVE METABOLIC PANEL
ALT: 29 U/L (ref 0–44)
AST: 30 U/L (ref 15–41)
Albumin: 4 g/dL (ref 3.5–5.0)
Alkaline Phosphatase: 44 U/L (ref 38–126)
Anion gap: 12 (ref 5–15)
BUN: 13 mg/dL (ref 6–20)
CO2: 21 mmol/L — ABNORMAL LOW (ref 22–32)
Calcium: 8.8 mg/dL — ABNORMAL LOW (ref 8.9–10.3)
Chloride: 102 mmol/L (ref 98–111)
Creatinine, Ser: 1.12 mg/dL (ref 0.61–1.24)
GFR, Estimated: >60 mL/min (ref >60)
Glucose, Bld: 98 mg/dL (ref 70–99)
Potassium: 3 mmol/L — ABNORMAL LOW (ref 3.5–5.1)
Sodium: 135 mmol/L (ref 135–145)
Total Bilirubin: 0.7 mg/dL (ref 0.3–1.2)
Total Protein: 8.3 g/dL — ABNORMAL HIGH (ref 6.5–8.1)

## 2021-08-07 LAB — TROPONIN I (HIGH SENSITIVITY)
Troponin I (High Sensitivity): 4 ng/L (ref <18)
Troponin I (High Sensitivity): 4 ng/L (ref <18)

## 2021-08-07 LAB — URINALYSIS, ROUTINE W REFLEX MICROSCOPIC
Bacteria, UA: NONE SEEN
Bilirubin Urine: NEGATIVE
Glucose, UA: NEGATIVE mg/dL
Ketones, ur: NEGATIVE mg/dL
Leukocytes,Ua: NEGATIVE
Nitrite: NEGATIVE
Protein, ur: NEGATIVE mg/dL
Specific Gravity, Urine: 1.023 (ref 1.005–1.030)
pH: 6 (ref 5.0–8.0)

## 2021-08-07 LAB — POC OCCULT BLOOD, ED: Fecal Occult Bld: POSITIVE — AB

## 2021-08-07 LAB — RESP PANEL BY RT-PCR (FLU A&B, COVID) ARPGX2
Influenza A by PCR: NEGATIVE
Influenza B by PCR: NEGATIVE
SARS Coronavirus 2 by RT PCR: NEGATIVE

## 2021-08-07 LAB — PROTIME-INR
INR: 1.1 (ref 0.8–1.2)
Prothrombin Time: 14.5 seconds (ref 11.4–15.2)

## 2021-08-07 LAB — D-DIMER, QUANTITATIVE: D-Dimer, Quant: <0.27 ug/mL-FEU (ref 0.00–0.50)

## 2021-08-07 LAB — MAGNESIUM: Magnesium: 2.3 mg/dL (ref 1.7–2.4)

## 2021-08-07 LAB — PHOSPHORUS: Phosphorus: 3.5 mg/dL (ref 2.5–4.6)

## 2021-08-07 MED ORDER — ACETAMINOPHEN 650 MG RE SUPP: 650.0000 mg | Freq: Four times a day (QID) | RECTAL | Status: DC | PRN

## 2021-08-07 MED ORDER — BICTEGRAVIR-EMTRICITAB-TENOFOV 50-200-25 MG PO TABS
1.0000 | ORAL_TABLET | Freq: Every day | ORAL | Status: DC
  Filled 2021-08-07: qty 1

## 2021-08-07 MED ORDER — METOPROLOL SUCCINATE ER 50 MG PO TB24
50.0000 mg | ORAL_TABLET | Freq: Two times a day (BID) | ORAL | Status: DC
  Filled 2021-08-07: qty 1

## 2021-08-07 MED ORDER — ACETAMINOPHEN 325 MG PO TABS: 650.0000 mg | ORAL_TABLET | Freq: Four times a day (QID) | ORAL | Status: DC | PRN

## 2021-08-07 MED ORDER — ACETAMINOPHEN 325 MG PO TABS
650.0000 mg | ORAL_TABLET | Freq: Once | ORAL | Status: AC
  Administered 2021-08-07: 650 mg via ORAL
  Filled 2021-08-07: qty 2

## 2021-08-07 MED ORDER — POTASSIUM CHLORIDE ER 20 MEQ PO TBCR: 20.0000 meq | EXTENDED_RELEASE_TABLET | Freq: Every day | ORAL | 0 refills | Status: DC

## 2021-08-07 MED ORDER — POTASSIUM CHLORIDE IN NACL 40-0.9 MEQ/L-% IV SOLN
INTRAVENOUS | Status: DC
  Filled 2021-08-07: qty 1000

## 2021-08-07 MED ORDER — SPIRONOLACTONE 25 MG PO TABS
25.0000 mg | ORAL_TABLET | Freq: Every day | ORAL | Status: DC
  Filled 2021-08-07: qty 1

## 2021-08-07 MED ORDER — POTASSIUM CHLORIDE CRYS ER 20 MEQ PO TBCR
40.0000 meq | EXTENDED_RELEASE_TABLET | Freq: Once | ORAL | Status: DC
  Filled 2021-08-07: qty 2

## 2021-08-07 MED ORDER — ONDANSETRON HCL 4 MG/2ML IJ SOLN: 4.0000 mg | Freq: Four times a day (QID) | INTRAMUSCULAR | Status: DC | PRN

## 2021-08-07 MED ORDER — ONDANSETRON HCL 4 MG PO TABS: 4.0000 mg | ORAL_TABLET | Freq: Four times a day (QID) | ORAL | Status: DC | PRN

## 2021-08-07 NOTE — H&P (Signed)
History and Physical    Ousman Dise QJJ:[id number] DOB: 1980/11/19 DOA: 08/07/2021  PCP: Verne Spurr, MD  Patient coming from: Home.  I have personally briefly reviewed patient's old medical records in [facility]  Chief Complaint: Rectal bleeding.  HPI: William Powers is a 40 y.o. male with medical history significant of systolic heart failure with an EF of 15% in 2019 now with normal heart function on most recent echocardiogram, history of HIV infection on Biktarvy, hypertension, history of polysubstance abuse who was recently admitted and discharged on anticoagulation therapy with Xarelto due to subsegmental PE who is coming to the ED due to rectal bleeding.  He has had episodic rectal bleeding for several months.  This has gotten worse since he started Xarelto.  No abdominal pain, nausea, emesis or diarrhea.  No flank tenderness, dysuria, frequency or hematuria.  No fever, chills, sore throat, rhinorrhea, dyspnea, wheezing or hemoptysis.  No chest pain, palpitations, diaphoresis, PND, orthopnea or pitting edema of the lower extremities.  Denied polyuria, polydipsia, polyphagia or blurred vision.  ED Course: Initial vital signs were temperature 98.3 F, pulse 93, respiration 18, BP 123/86 mmHg O2 sat 94% on room air.  The patient received 650 mg of acetaminophen orally in the emergency department.  He was seen by gastroenterology, colonoscopy was proposed but he preferred to wait until seen by [facility] in November.  Lab work: His urinalysis showed moderate hemoglobinuria but was otherwise within normal parameters.  Fecal occult blood was positive.  CBC had a white count 6.6, hemoglobin 13.6 g/dL and platelets 481.  PT, INR and D-dimer were normal.  Troponin x2 was normal.  CMP showed a potassium of 3.0 and CO2 of 21 mmol/L.  Calcium of 8.8 mg/dL and total protein of 8.4 g/dL.  The rest of the CMP values were within expected range.  Imaging: A one-view portable chest radiograph was  unremarkable.  Review of Systems: As per HPI otherwise all other systems reviewed and are negative.  Past Medical History:  Diagnosis Date   Heart failure (HCC)    HFrEF (heart failure with reduced ejection fraction) (HCC)    15% EF as of 2019   HIV (human immunodeficiency virus infection) (HCC)    Hypertension    Polysubstance abuse (HCC)    Past Surgical History:  Procedure Laterality Date   HERNIA REPAIR     Social History  reports that he has quit smoking. His smoking use included cigarettes. He smoked an average of 2 packs per day. He has never used smokeless tobacco. He reports that he does not currently use alcohol. He reports that he does not currently use drugs after having used the following drugs: Marijuana.  Allergies  Allergen Reactions   Bee Venom Anaphylaxis   Lisinopril Swelling    Reported on 5/14 by him to his my chart account : was in ED for swollen lips     Sulfamethoxazole-Trimethoprim Rash and Swelling   Family History  Problem Relation Age of Onset   Lupus Mother    HIV Mother    HIV Father    Prior to Admission medications   Medication Sig Start Date End Date Taking? Authorizing Provider  BIKTARVY 50-200-25 MG TABS tablet Take 1 tablet by mouth daily. 06/12/21  Yes [provider]  eplerenone (INSPRA) 25 MG tablet Take 25 mg by mouth daily. 06/13/20 04/10/22 Yes [provider]  furosemide (LASIX) 40 MG tablet Take 60 mg by mouth daily. 03/26/20 05/10/22 Yes [provider]  metoprolol succinate (TOPROL-XL) 50 MG 24 hr tablet Take 50 mg by mouth 2 (two) times daily. 05/12/21  Yes [provider]  RIVAROXABAN Carlena Hurl) VTE STARTER PACK (15 & 20 MG) Follow package directions: Take one 15mg  tablet by mouth twice a day. On day 22 (08/13/2021), switch to one 20mg  tablet once a day. Take with food. 07/24/21  Yes Rai, Ripudeep K, MD  docusate sodium (COLACE) 100 MG capsule Take 1 capsule (100 mg total) by mouth daily as needed for  up to 60 doses for mild constipation. Take with iron to prevent constipation Patient not taking: Reported on 08/07/2021 07/30/21   08/09/2021, MD  ferrous sulfate 325 (65 FE) MG tablet Take 1 tablet (325 mg total) by mouth daily. Patient not taking: No sig reported 07/30/21 08/29/21  09/29/21, MD  rivaroxaban (XARELTO) 20 MG TABS tablet Take 1 tablet (20 mg total) by mouth daily with supper. Please start this prescription after you have completed the starter pack. 08/13/21   Terald Sleeper, MD   Physical Exam: Vitals:   08/07/21 0630 08/07/21 0645 08/07/21 0700 08/07/21 0730  BP: 123/72 120/88 (!) 138/93 (!) 150/90  Pulse: 73 73 81 71  Resp: 16   18  Temp:      TempSrc:      SpO2: 95% 96% 97% 100%  Weight:      Height:       Constitutional: NAD, calm, comfortable Eyes: PERRL, lids and conjunctivae normal ENMT: Mucous membranes are moist. Posterior pharynx clear of any exudate or lesions. Neck: normal, supple, no masses, no thyromegaly Respiratory: clear to auscultation bilaterally, no wheezing, no crackles. Normal respiratory effort. No accessory muscle use.  Cardiovascular: Regular rate and rhythm, no murmurs / rubs / gallops. No extremity edema. 2+ pedal pulses. No carotid bruits.  Abdomen: No distention.  Bowel sounds positive.  Soft, no tenderness, no masses palpated. No hepatosplenomegaly.  Musculoskeletal: no clubbing / cyanosis. Good ROM, no contractures. Normal muscle tone.  Skin: no rashes, lesions, ulcers on limited dermatological examination. Neurologic: CN 2-12 grossly intact. Sensation intact, DTR normal. Strength 5/5 in all 4.  Psychiatric: Normal judgment and insight. Alert and oriented x 3. Normal mood.   Labs on Admission: I have personally reviewed following labs and imaging studies  CBC: Recent Labs  Lab 08/07/21 0027  WBC 6.6  NEUTROABS 3.7  HGB 13.6  HCT 41.0  MCV 96.5  PLT 292   Basic Metabolic Panel: Recent Labs  Lab 08/07/21 0027   NA 135  K 3.0*  CL 102  CO2 21*  GLUCOSE 98  BUN 13  CREATININE 1.12  CALCIUM 8.8*   GFR: Estimated Creatinine Clearance: 119.1 mL/min (by C-G formula based on SCr of 1.12 mg/dL).  Liver Function Tests: Recent Labs  Lab 08/07/21 0027  AST 30  ALT 29  ALKPHOS 44  BILITOT 0.7  PROT 8.3*  ALBUMIN 4.0   Urine analysis:    Component Value Date/Time   COLORURINE YELLOW 08/07/2021 0300   APPEARANCEUR CLEAR 08/07/2021 0300   LABSPEC 1.023 08/07/2021 0300   PHURINE 6.0 08/07/2021 0300   GLUCOSEU NEGATIVE 08/07/2021 0300   HGBUR MODERATE (A) 08/07/2021 0300   BILIRUBINUR NEGATIVE 08/07/2021 0300   KETONESUR NEGATIVE 08/07/2021 0300   PROTEINUR NEGATIVE 08/07/2021 0300   NITRITE NEGATIVE 08/07/2021 0300   LEUKOCYTESUR NEGATIVE 08/07/2021 0300   Radiological Exams on Admission: DG Chest Port 1 View  Result Date: 08/07/2021 CLINICAL DATA:  Blood in  stool, on Xarelto for PE EXAM: PORTABLE CHEST 1 VIEW COMPARISON:  07/31/2021 FINDINGS: Lungs are clear.  No pleural effusion or pneumothorax. Heart is top-normal in size. IMPRESSION: No evidence of acute cardiopulmonary disease. Electronically Signed   By: Charline Bills M.D.   On: 08/07/2021 01:16    Echocardiogram 07/23/2021 IMPRESSIONS:   1. Left ventricular ejection fraction, by estimation, is 55 to 60%. The  left ventricle has normal function. The left ventricle has no regional  wall motion abnormalities. Left ventricular diastolic parameters were  normal.   2. Right ventricular systolic function is normal. The right ventricular  size is normal.   3. The mitral valve is abnormal. Trivial mitral valve regurgitation. No  evidence of mitral stenosis.   4. The aortic valve is tricuspid. Aortic valve regurgitation is not  visualized. Mild aortic valve sclerosis is present, with no evidence of  aortic valve stenosis.   5. The inferior vena cava is normal in size with greater than 50%  respiratory variability, suggesting  right atrial pressure of 3 mmHg.   EKG: Independently reviewed.  Vent. rate 86 BPM PR interval 173 ms QRS duration 101 ms QT/QTcB 361/432 ms P-R-T axes 61 64 56 Sinus rhythm Left atrial enlargement RSR' in V1 or V2, right VCD or RVH  Assessment/Plan Principal Problem:   Rectal bleeding Was occurring before Xarelto. Seen by gastroenterology. Gastroenterology consult appreciated. Hemoglobin is stable and can be discharged. He will follow-up with [facility] for colonoscopy.  Active Problems:   Single subsegmental pulmonary embolism without acute cor pulmonale  He was a little apprehensive to discontinue anticoagulation at this time. He understand that there is an increase risk of bleeding while taking it. Preprocedure Xarelto instructions to be done by [facility] gastroenterology.    HIV (human immunodeficiency virus infection) (HCC) Continue Biktarvy.    History of systolic CHF (congestive heart failure) This has resolved. He has not followed with cardiology in some time. Asked to follow-up with [facility] cardiology or establish locally.    Hypokalemia He will need a follow-up BMP. Asked to follow-up 1 to 2 weeks with PCP.    Hypocalcemia Needs a repeat calcium level. Further work-up depending on result. Follow-up with PCP.    DVT prophylaxis: SCDs. Code Status:   Full code. Family Communication:   Disposition Plan:   Patient is from:  Home.  Anticipated DC to:  Home.  Anticipated DC date:  08/08/2021 or 08/09/2021.  Anticipated DC barriers: Clinical status.  Consults called:  [facility] (Dr. Levora Angel). Admission status:  Observation/telemetry.  Severity of Illness:  High severity after presenting with history of rectal bleeding after starting anticoagulation therapy due to recent subsegmental pulmonary embolism.  Patient will remain in the hospital for H&H monitoring and GI evaluation.   Bobette Mo MD [facility]  How to contact the [facility] Attending or  Consulting provider 7A - 7P or covering provider during after hours 7P -7A, for this patient?   Check the care team in [facility] and look for a) attending/consulting [facility] provider listed and b) the [facility] team listed Log into [email or web address] and use [place]'s universal password to access. If you do not have the password, please contact the hospital operator. Locate the [facility] provider you are looking for under [facility] and page to a number that you can be directly reached. If you still have difficulty reaching the provider, please page the [facility] (Director on Call) for the Hospitalists listed on amion for assistance.  08/07/2021, 8:34 AM  This document was prepared using Dragon voice recognition software and may contain some unintended transcription errors.

## 2021-08-07 NOTE — Discharge Summary (Signed)
Physician Discharge Summary  William Powers ZOX:[id number] DOB: Mar 05, 1981 DOA: 08/07/2021  PCP: Verne Spurr, MD  Admit date: 08/07/2021 Discharge date: 08/07/2021  Admitted From: Home. Disposition: Home.  Recommendations for Outpatient Follow-up:  Follow up with PCP in 1-2 weeks Please obtain BMP/CBC in one week Please follow up on the following pending results:  Home Health: No. Equipment/Devices: No. Discharge Condition: Stable. CODE STATUS: Full code. Diet recommendation: Heart healthy.  Brief/Interim Summary: 40 y.o. male with medical history significant of systolic heart failure with an EF of 15% in 2019 now with normal heart function on most recent echocardiogram, history of HIV infection on Biktarvy, hypertension, history of polysubstance abuse who was recently admitted and discharged on anticoagulation therapy with Xarelto due to subsegmental PE who is coming to the ED due to rectal bleeding.  He has had episodic rectal bleeding for several months.  This has gotten worse since he started Xarelto.  No abdominal pain, nausea, emesis or diarrhea.  No flank tenderness, dysuria, frequency or hematuria.  No fever, chills, sore throat, rhinorrhea, dyspnea, wheezing or hemoptysis.  No chest pain, palpitations, diaphoresis, PND, orthopnea or pitting edema of the lower extremities.  Denied polyuria, polydipsia, polyphagia or blurred vision.  Discharge Diagnoses:  Principal Problem:   Rectal bleeding Was occurring before Xarelto. Seen by gastroenterology. Gastroenterology consult appreciated. Hemoglobin is stable and can be discharged. He will follow-up with [facility] for colonoscopy.   Active Problems:   Single subsegmental pulmonary embolism without acute cor pulmonale  He was a little apprehensive to discontinue anticoagulation at this time. He understand that there is an increase risk of bleeding while taking it. Preprocedure Xarelto instructions to be done by [facility] gastroenterology.      HIV (human immunodeficiency virus infection) (HCC) Continue Biktarvy.     History of systolic CHF (congestive heart failure) This has resolved. He has not followed with cardiology in some time. Asked to follow-up with [facility] cardiology or establish locally.     Hypokalemia Begin KCl 20 mEq p.o. daily. He will need a follow-up BMP. Asked to follow-up 1 to 2 weeks with PCP.     Hypocalcemia Needs a repeat calcium level. Further work-up depending on result. Follow-up with PCP.   Discharge Instructions Start KCl 20 mEq p.o. daily. Follow-up with PCP for BMP check.  Allergies as of 08/07/2021       Reactions   Bee Venom Anaphylaxis   Lisinopril Swelling   Reported on 5/14 by him to his my chart account : was in ED for swollen lips    Sulfamethoxazole-trimethoprim Rash, Swelling        Medication List     STOP taking these medications    ferrous sulfate 325 (65 FE) MG tablet   rivaroxaban 20 MG Tabs tablet Commonly known as: XARELTO   Rivaroxaban Stater Pack (15 mg and 20 mg) Commonly known as: XARELTO STARTER PACK       TAKE these medications    Biktarvy 50-200-25 MG Tabs tablet Generic drug: bictegravir-emtricitabine-tenofovir AF Take 1 tablet by mouth daily.   docusate sodium 100 MG capsule Commonly known as: COLACE Take 1 capsule (100 mg total) by mouth daily as needed for up to 60 doses for mild constipation. Take with iron to prevent constipation   eplerenone 25 MG tablet Commonly known as: INSPRA Take 25 mg by mouth daily.   furosemide 40 MG tablet Commonly known as: LASIX Take 60 mg by mouth daily.   metoprolol succinate 50 MG 24 hr tablet  Commonly known as: TOPROL-XL Take 50 mg by mouth 2 (two) times daily.   Potassium Chloride ER 20 MEQ Tbcr Take 20 mEq by mouth daily at 6 (six) AM.        Allergies  Allergen Reactions   Bee Venom Anaphylaxis   Lisinopril Swelling    Reported on 5/14 by him to his my chart account : was in ED for  swollen lips     Sulfamethoxazole-Trimethoprim Rash and Swelling    Consultations: [facility] gastroenterology due to rectal bleeding.  Procedures/Studies: DG Chest 2 View  Result Date: 07/23/2021 CLINICAL DATA:  Cough for several months with chest pain, initial encounter EXAM: CHEST - 2 VIEW COMPARISON:  07/16/2021 FINDINGS: Cardiac shadow is stable. The lungs are clear bilaterally. No focal infiltrate or sizable effusion is seen. No bony abnormality is noted. IMPRESSION: No active cardiopulmonary disease. Electronically Signed   By: Alcide Clever M.D.   On: 07/23/2021 00:55   DG Chest 2 View  Result Date: 07/16/2021 CLINICAL DATA:  Chest pain. EXAM: CHEST - 2 VIEW COMPARISON:  06/08/2021 FINDINGS: The cardiomediastinal silhouette is unchanged with normal heart size. The lungs are mildly hypoinflated. No airspace consolidation, edema, pleural effusion, pneumothorax is identified. No acute osseous abnormality is seen. IMPRESSION: No active cardiopulmonary disease. Electronically Signed   By: Sebastian Ache M.D.   On: 07/16/2021 07:13   CT Angio Chest PE W/Cm &/Or Wo Cm  Result Date: 07/23/2021 CLINICAL DATA:  Cough for several months with chest pain, initial encounter EXAM: CT ANGIOGRAPHY CHEST WITH CONTRAST TECHNIQUE: Multidetector CT imaging of the chest was performed using the standard protocol during bolus administration of intravenous contrast. Multiplanar CT image reconstructions and MIPs were obtained to evaluate the vascular anatomy. CONTRAST:  OMNIPAQUE IOHEXOL 350 MG/ML SOLN COMPARISON:  Chest x-ray from earlier in the same day. FINDINGS: Cardiovascular: Thoracic aorta and its branches are within normal limits. No aneurysmal dilatation or dissection is noted. Heart is at the upper limits of normal in size. The pulmonary artery shows a normal branching pattern bilaterally. Filling defect is noted in the right upper lobe pulmonary artery which is nonocclusive but consistent with small  pulmonary embolus. No other emboli are seen. Mediastinum/Nodes: Thoracic inlet is within normal limits. No sizable hilar or mediastinal adenopathy is noted. The esophagus as visualized is within normal limits. Lungs/Pleura: Lungs are well aerated bilaterally. No focal infiltrate or sizable effusion is seen. No sizable nodule is noted. Upper Abdomen: Visualized upper abdomen is within normal limits. Musculoskeletal: Degenerative changes of the thoracic spine are seen. No acute rib abnormality is noted. Review of the MIP images confirms the above findings. IMPRESSION: Nonocclusive filling defect in the right upper lobe pulmonary artery consistent with pulmonary embolus. No right heart strain is seen. No other focal abnormality is noted. Electronically Signed   By: Alcide Clever M.D.   On: 07/23/2021 02:32   CT ABDOMEN PELVIS W CONTRAST  Result Date: 07/26/2021 CLINICAL DATA:  40 year old male with history of blood in the stool. Suspected abdominal abscess or infection. EXAM: CT ABDOMEN AND PELVIS WITH CONTRAST TECHNIQUE: Multidetector CT imaging of the abdomen and pelvis was performed using the standard protocol following bolus administration of intravenous contrast. CONTRAST:  9mL OMNIPAQUE IOHEXOL 350 MG/ML SOLN COMPARISON:  CT the abdomen and pelvis 07/16/2021. FINDINGS: Lower chest: Unremarkable. Hepatobiliary: No suspicious cystic or solid hepatic lesions. No intra or extrahepatic biliary ductal dilatation. Gallbladder is normal in appearance. Pancreas: No pancreatic mass. No pancreatic ductal dilatation. No  pancreatic or peripancreatic fluid collections or inflammatory changes. Spleen: Unremarkable. Adrenals/Urinary Tract: Patchy multifocal cortical thinning in the interpolar and lower pole regions of the left kidney, presumably chronic scarring from prior infection or infarction. Right kidney and bilateral adrenal glands are otherwise normal in appearance. No hydroureteronephrosis. Urinary bladder is normal  in appearance. Stomach/Bowel: The appearance of the stomach is normal. There is no pathologic dilatation of small bowel or colon. Normal appendix. Vascular/Lymphatic: No significant atherosclerotic disease, aneurysm or dissection noted in the abdominal or pelvic vasculature. No lymphadenopathy noted in the abdomen or pelvis. Reproductive: Prostate gland and seminal vesicles are unremarkable in appearance. Other: No significant volume of ascites.  No pneumoperitoneum. Musculoskeletal: There are no aggressive appearing lytic or blastic lesions noted in the visualized portions of the skeleton. IMPRESSION: 1. No acute findings are noted in the abdomen or pelvis to account for the patient's symptoms. 2. Chronic scarring in the interpolar and lower pole region of the left kidney, similar to the prior study. Electronically Signed   By: Trudie Reed M.D.   On: 07/26/2021 05:32   CT Abdomen Pelvis W Contrast  Result Date: 07/16/2021 CLINICAL DATA:  Microhematuria with unknown cause EXAM: CT ABDOMEN AND PELVIS WITH CONTRAST TECHNIQUE: Multidetector CT imaging of the abdomen and pelvis was performed using the standard protocol following bolus administration of intravenous contrast. CONTRAST:  53mL OMNIPAQUE IOHEXOL 350 MG/ML SOLN COMPARISON:  None. FINDINGS: Lower chest:  Partially covered gynecomastia on the left. Hepatobiliary: No focal liver abnormality.No evidence of biliary obstruction or stone. Pancreas: Unremarkable. Spleen: Unremarkable. Adrenals/Urinary Tract: Negative adrenals. No hydronephrosis or stone. Patchy renal cortical scarring at the left lower pole. Unremarkable bladder. Stomach/Bowel:  No obstruction. No appendicitis. Vascular/Lymphatic: No acute vascular abnormality. No mass or adenopathy. Reproductive:No pathologic findings. Other: No ascites or pneumoperitoneum. Shallow, fatty right inguinal hernia. Musculoskeletal: No acute abnormalities. IMPRESSION: 1. No specific cause for symptoms.  No mass  or stone. 2. Patchy cortical scarring at the lower pole left kidney. Electronically Signed   By: Tiburcio Pea M.D.   On: 07/16/2021 07:42   DG Chest Port 1 View  Result Date: 08/07/2021 CLINICAL DATA:  Blood in stool, on Xarelto for PE EXAM: PORTABLE CHEST 1 VIEW COMPARISON:  07/31/2021 FINDINGS: Lungs are clear.  No pleural effusion or pneumothorax. Heart is top-normal in size. IMPRESSION: No evidence of acute cardiopulmonary disease. Electronically Signed   By: Charline Bills M.D.   On: 08/07/2021 01:16   DG Chest Portable 1 View  Result Date: 07/31/2021 CLINICAL DATA:  Shortness of breath and chest pain. EXAM: PORTABLE CHEST 1 VIEW COMPARISON:  07/23/2021 FINDINGS: The heart size and mediastinal contours are within normal limits. Both lungs are clear. The visualized skeletal structures are unremarkable. IMPRESSION: No active disease. Electronically Signed   By: Burman Nieves M.D.   On: 07/31/2021 03:08   ECHOCARDIOGRAM COMPLETE  Result Date: 07/23/2021    ECHOCARDIOGRAM REPORT   Patient Name:   JONANTHAN ANDREOLA Date of Exam: 07/23/2021 Medical Rec #:  [id number]   Height:       75.0 in Accession #:    [id number]  Weight:       255.0 lb Date of Birth:  [birth year]    BSA:          2.433 m Patient Age:    40 years    BP:           119/90 mmHg Patient Gender: M  HR:           74 bpm. Exam Location:  Inpatient Procedure: 2D Echo, Color Doppler and Cardiac Doppler Indications:    I50.31 Acute diastolic (congestive) heart failure; I26.02                 Pulmonary embolus  History:        Patient has no prior history of Echocardiogram examinations.                 CHF; Risk Factors:Hypertension and Polysubstance abuse. Prior                 echo at [facility] per patient.  Sonographer:    Irving Burton Senior RDCS Referring Phys: [phone] JARED M GARDNER IMPRESSIONS  1. Left ventricular ejection fraction, by estimation, is 55 to 60%. The left ventricle has normal function. The left ventricle has no regional wall  motion abnormalities. Left ventricular diastolic parameters were normal.  2. Right ventricular systolic function is normal. The right ventricular size is normal.  3. The mitral valve is abnormal. Trivial mitral valve regurgitation. No evidence of mitral stenosis.  4. The aortic valve is tricuspid. Aortic valve regurgitation is not visualized. Mild aortic valve sclerosis is present, with no evidence of aortic valve stenosis.  5. The inferior vena cava is normal in size with greater than 50% respiratory variability, suggesting right atrial pressure of 3 mmHg. FINDINGS  Left Ventricle: Left ventricular ejection fraction, by estimation, is 55 to 60%. The left ventricle has normal function. The left ventricle has no regional wall motion abnormalities. The left ventricular internal cavity size was normal in size. There is  no left ventricular hypertrophy. Left ventricular diastolic parameters were normal. Right Ventricle: The right ventricular size is normal. No increase in right ventricular wall thickness. Right ventricular systolic function is normal. Left Atrium: Left atrial size was normal in size. Right Atrium: Right atrial size was normal in size. Pericardium: There is no evidence of pericardial effusion. Mitral Valve: The mitral valve is abnormal. There is mild thickening of the mitral valve leaflet(s). There is mild calcification of the mitral valve leaflet(s). Mild mitral annular calcification. Trivial mitral valve regurgitation. No evidence of mitral valve stenosis. Tricuspid Valve: The tricuspid valve is normal in structure. Tricuspid valve regurgitation is trivial. No evidence of tricuspid stenosis. Aortic Valve: The aortic valve is tricuspid. Aortic valve regurgitation is not visualized. Mild aortic valve sclerosis is present, with no evidence of aortic valve stenosis. Pulmonic Valve: The pulmonic valve was normal in structure. Pulmonic valve regurgitation is not visualized. No evidence of pulmonic stenosis.  Aorta: The aortic root is normal in size and structure. Venous: The inferior vena cava is normal in size with greater than 50% respiratory variability, suggesting right atrial pressure of 3 mmHg. IAS/Shunts: No atrial level shunt detected by color flow Doppler.  LEFT VENTRICLE PLAX 2D LVIDd:         4.70 cm  Diastology LVIDs:         3.20 cm  LV e' medial:    8.92 cm/s LV PW:         0.80 cm  LV E/e' medial:  6.6 LV IVS:        1.00 cm  LV e' lateral:   9.36 cm/s LVOT diam:     2.30 cm  LV E/e' lateral: 6.3 LV SV:         72 LV SV Index:   30 LVOT Area:  4.15 cm  RIGHT VENTRICLE RV S prime:     10.20 cm/s TAPSE (M-mode): 2.2 cm LEFT ATRIUM             Index       RIGHT ATRIUM           Index LA diam:        3.40 cm 1.40 cm/m  RA Area:     18.60 cm LA Vol (A2C):   62.8 ml 25.81 ml/m RA Volume:   51.60 ml  21.21 ml/m LA Vol (A4C):   41.0 ml 16.85 ml/m LA Biplane Vol: 52.9 ml 21.74 ml/m  AORTIC VALVE LVOT Vmax:   96.60 cm/s LVOT Vmean:  64.800 cm/s LVOT VTI:    0.173 m  AORTA Ao Root diam: 3.40 cm Ao Asc diam:  3.00 cm MITRAL VALVE MV Area (PHT): 3.27 cm    SHUNTS MV Decel Time: 232 msec    Systemic VTI:  0.17 m MV E velocity: 59.10 cm/s  Systemic Diam: 2.30 cm MV A velocity: 51.40 cm/s MV E/A ratio:  1.15 Charlton Haws MD Electronically signed by Charlton Haws MD Signature Date/Time: 07/23/2021/8:58:29 AM    Final    VAS [place] LOWER EXTREMITY VENOUS (DVT)  Result Date: 07/23/2021  Lower Venous DVT Study Patient Name:  GIANFRANCO ARAKI  Date of Exam:   07/23/2021 Medical Rec #: [id number]    Accession #:    [id number] Date of Birth: Jun 08, 1981     Patient Gender: M Patient Age:   52 years Exam Location:  [facility] Procedure:      VAS [place] LOWER EXTREMITY VENOUS (DVT) Referring Phys: Lyda Perone --------------------------------------------------------------------------------  Indications: Pulmonary embolism.  Risk Factors: Confirmed PE. Anticoagulation: Heparin. Comparison Study: No prior studies. Performing  Technologist: Chanda Busing RVT  Examination Guidelines: A complete evaluation includes B-mode imaging, spectral Doppler, color Doppler, and power Doppler as needed of all accessible portions of each vessel. Bilateral testing is considered an integral part of a complete examination. Limited examinations for reoccurring indications may be performed as noted. The reflux portion of the exam is performed with the patient in reverse Trendelenburg.  +---------+---------------+---------+-----------+----------+--------------+ RIGHT    CompressibilityPhasicitySpontaneityPropertiesThrombus Aging +---------+---------------+---------+-----------+----------+--------------+ CFV      Full           Yes      Yes                                 +---------+---------------+---------+-----------+----------+--------------+ SFJ      Full                                                        +---------+---------------+---------+-----------+----------+--------------+ FV Prox  Full                                                        +---------+---------------+---------+-----------+----------+--------------+ FV Mid   Full                                                        +---------+---------------+---------+-----------+----------+--------------+  FV DistalFull                                                        +---------+---------------+---------+-----------+----------+--------------+ PFV      Full                                                        +---------+---------------+---------+-----------+----------+--------------+ POP      Full           Yes      Yes                                 +---------+---------------+---------+-----------+----------+--------------+ PTV      Full                                                        +---------+---------------+---------+-----------+----------+--------------+ PERO     Full                                                         +---------+---------------+---------+-----------+----------+--------------+   +---------+---------------+---------+-----------+----------+--------------+ LEFT     CompressibilityPhasicitySpontaneityPropertiesThrombus Aging +---------+---------------+---------+-----------+----------+--------------+ CFV      Full           Yes      Yes                                 +---------+---------------+---------+-----------+----------+--------------+ SFJ      Full                                                        +---------+---------------+---------+-----------+----------+--------------+ FV Prox  Full                                                        +---------+---------------+---------+-----------+----------+--------------+ FV Mid   Full                                                        +---------+---------------+---------+-----------+----------+--------------+ FV DistalFull                                                        +---------+---------------+---------+-----------+----------+--------------+  PFV      Full                                                        +---------+---------------+---------+-----------+----------+--------------+ POP      Full           Yes      Yes                                 +---------+---------------+---------+-----------+----------+--------------+ PTV      Full                                                        +---------+---------------+---------+-----------+----------+--------------+ PERO     Full                                                        +---------+---------------+---------+-----------+----------+--------------+     Summary: RIGHT: - There is no evidence of deep vein thrombosis in the lower extremity.  - No cystic structure found in the popliteal fossa.  LEFT: - There is no evidence of deep vein thrombosis in the lower extremity.  - No cystic structure found in  the popliteal fossa.  *See table(s) above for measurements and observations. Electronically signed by Waverly Ferrari MD on 07/23/2021 at 5:41:28 PM.    Final    (Echo, Carotid, EGD, Colonoscopy, ERCP)    Subjective:   Discharge Exam: Vitals:   08/07/21 0730 08/07/21 0900  BP: (!) 150/90 (!) 136/95  Pulse: 71 85  Resp: 18 16  Temp:    SpO2: 100% 97%   General: Pt is alert, awake, not in acute distress Cardiovascular: RRR, S1/S2 +, no rubs, no gallops Respiratory: CTA bilaterally, no wheezing, no rhonchi Abdominal: Soft, NT, ND, bowel sounds + Extremities: no edema, no cyanosis  The results of significant diagnostics from this hospitalization (including imaging, microbiology, ancillary and laboratory) are listed below for reference.    Microbiology: Recent Results (from the past 240 hour(s))  Resp Panel by RT-PCR (Flu A&B, Covid) Nasopharyngeal Swab     Status: None   Collection Time: 08/07/21  2:41 AM   Specimen: Nasopharyngeal Swab; Nasopharyngeal(NP) swabs in vial transport medium  Result Value Ref Range Status   SARS Coronavirus 2 by RT PCR NEGATIVE NEGATIVE Final    Comment: (NOTE) SARS-CoV-2 target nucleic acids are NOT DETECTED.  The SARS-CoV-2 RNA is generally detectable in upper respiratory specimens during the acute phase of infection. The lowest concentration of SARS-CoV-2 viral copies this assay can detect is 138 copies/mL. A negative result does not preclude SARS-Cov-2 infection and should not be used as the sole basis for treatment or other patient management decisions. A negative result may occur with  improper specimen collection/handling, submission of specimen other than nasopharyngeal swab, presence of viral mutation(s) within the areas targeted by this assay, and inadequate number of viral copies(<138 copies/mL). A negative result must be combined with clinical observations,  patient history, and epidemiological information. The expected result is  Negative.  Fact Sheet for Patients:  BloggerCourse.com  Fact Sheet for Healthcare Providers:  SeriousBroker.it  This test is no t yet approved or cleared by the [place] FDA and  has been authorized for detection and/or diagnosis of SARS-CoV-2 by FDA under an Emergency Use Authorization (EUA). This EUA will remain  in effect (meaning this test can be used) for the duration of the COVID-19 declaration under Section 564(b)(1) of the Act, 21 U.S.C.section 360bbb-3(b)(1), unless the authorization is terminated  or revoked sooner.       Influenza A by PCR NEGATIVE NEGATIVE Final   Influenza B by PCR NEGATIVE NEGATIVE Final    Comment: (NOTE) The Xpert Xpress SARS-CoV-2/FLU/RSV plus assay is intended as an aid in the diagnosis of influenza from Nasopharyngeal swab specimens and should not be used as a sole basis for treatment. Nasal washings and aspirates are unacceptable for Xpert Xpress SARS-CoV-2/FLU/RSV testing.  Fact Sheet for Patients: BloggerCourse.com  Fact Sheet for Healthcare Providers: SeriousBroker.it  This test is not yet approved or cleared by the [place] FDA and has been authorized for detection and/or diagnosis of SARS-CoV-2 by FDA under an Emergency Use Authorization (EUA). This EUA will remain in effect (meaning this test can be used) for the duration of the COVID-19 declaration under Section 564(b)(1) of the Act, 21 U.S.C. section 360bbb-3(b)(1), unless the authorization is terminated or revoked.  Performed at [facility], [address], [place], [place] [address]     Labs: BNP (last 3 results) Recent Labs    06/07/21 0118 06/08/21 0119 07/23/21 0113  BNP 6.6 10.0 17.3   Basic Metabolic Panel: Recent Labs  Lab 08/07/21 0027  NA 135  K 3.0*  CL 102  CO2 21*  GLUCOSE 98  BUN 13  CREATININE 1.12  CALCIUM 8.8*    Liver Function Tests: Recent Labs  Lab 08/07/21 0027  AST 30  ALT 29  ALKPHOS 44  BILITOT 0.7  PROT 8.3*  ALBUMIN 4.0   No results for input(s): LIPASE, AMYLASE in the last 168 hours. No results for input(s): AMMONIA in the last 168 hours. CBC: Recent Labs  Lab 08/07/21 0027  WBC 6.6  NEUTROABS 3.7  HGB 13.6  HCT 41.0  MCV 96.5  PLT 292   Cardiac Enzymes: No results for input(s): CKTOTAL, CKMB, CKMBINDEX, TROPONINI in the last 168 hours. BNP: Invalid input(s): POCBNP CBG: No results for input(s): GLUCAP in the last 168 hours. D-Dimer Recent Labs    08/07/21 0027  DDIMER <0.27   Hgb A1c No results for input(s): HGBA1C in the last 72 hours. Lipid Profile No results for input(s): CHOL, HDL, LDLCALC, TRIG, CHOLHDL, LDLDIRECT in the last 72 hours. Thyroid function studies No results for input(s): TSH, T4TOTAL, T3FREE, THYROIDAB in the last 72 hours.  Invalid input(s): FREET3 Anemia work up No results for input(s): VITAMINB12, FOLATE, FERRITIN, TIBC, IRON, RETICCTPCT in the last 72 hours. Urinalysis    Component Value Date/Time   COLORURINE YELLOW 08/07/2021 0300   APPEARANCEUR CLEAR 08/07/2021 0300   LABSPEC 1.023 08/07/2021 0300   PHURINE 6.0 08/07/2021 0300   GLUCOSEU NEGATIVE 08/07/2021 0300   HGBUR MODERATE (A) 08/07/2021 0300   BILIRUBINUR NEGATIVE 08/07/2021 0300   KETONESUR NEGATIVE 08/07/2021 0300   PROTEINUR NEGATIVE 08/07/2021 0300   NITRITE NEGATIVE 08/07/2021 0300   LEUKOCYTESUR NEGATIVE 08/07/2021 0300   Sepsis Labs Invalid input(s): PROCALCITONIN,  WBC,  LACTICIDVEN Microbiology Recent Results (  from the past 240 hour(s))  Resp Panel by RT-PCR (Flu A&B, Covid) Nasopharyngeal Swab     Status: None   Collection Time: 08/07/21  2:41 AM   Specimen: Nasopharyngeal Swab; Nasopharyngeal(NP) swabs in vial transport medium  Result Value Ref Range Status   SARS Coronavirus 2 by RT PCR NEGATIVE NEGATIVE Final    Comment: (NOTE) SARS-CoV-2 target  nucleic acids are NOT DETECTED.  The SARS-CoV-2 RNA is generally detectable in upper respiratory specimens during the acute phase of infection. The lowest concentration of SARS-CoV-2 viral copies this assay can detect is 138 copies/mL. A negative result does not preclude SARS-Cov-2 infection and should not be used as the sole basis for treatment or other patient management decisions. A negative result may occur with  improper specimen collection/handling, submission of specimen other than nasopharyngeal swab, presence of viral mutation(s) within the areas targeted by this assay, and inadequate number of viral copies(<138 copies/mL). A negative result must be combined with clinical observations, patient history, and epidemiological information. The expected result is Negative.  Fact Sheet for Patients:  BloggerCourse.com  Fact Sheet for Healthcare Providers:  SeriousBroker.it  This test is no t yet approved or cleared by the [place] FDA and  has been authorized for detection and/or diagnosis of SARS-CoV-2 by FDA under an Emergency Use Authorization (EUA). This EUA will remain  in effect (meaning this test can be used) for the duration of the COVID-19 declaration under Section 564(b)(1) of the Act, 21 U.S.C.section 360bbb-3(b)(1), unless the authorization is terminated  or revoked sooner.       Influenza A by PCR NEGATIVE NEGATIVE Final   Influenza B by PCR NEGATIVE NEGATIVE Final    Comment: (NOTE) The Xpert Xpress SARS-CoV-2/FLU/RSV plus assay is intended as an aid in the diagnosis of influenza from Nasopharyngeal swab specimens and should not be used as a sole basis for treatment. Nasal washings and aspirates are unacceptable for Xpert Xpress SARS-CoV-2/FLU/RSV testing.  Fact Sheet for Patients: BloggerCourse.com  Fact Sheet for Healthcare  Providers: SeriousBroker.it  This test is not yet approved or cleared by the [place] FDA and has been authorized for detection and/or diagnosis of SARS-CoV-2 by FDA under an Emergency Use Authorization (EUA). This EUA will remain in effect (meaning this test can be used) for the duration of the COVID-19 declaration under Section 564(b)(1) of the Act, 21 U.S.C. section 360bbb-3(b)(1), unless the authorization is terminated or revoked.  Performed at [facility], [address] [place], [place] [address]    Time coordinating discharge: Over 30 minutes  SIGNED: Bobette Mo, MD  [facility] 08/07/2021, 12:24 PM Pager   If 7PM-7AM, please contact night-coverage [email or web address] Password [email or web address]   This document was prepared using Conservation officer, historic buildings and may contain some unintended transcription errors.

## 2021-08-07 NOTE — ED Triage Notes (Signed)
Patient arrives from home with report of blood in stool. Pt reports starting xarelto x 1 week ago after developing a clot in his lungs. Pt states he was instructed by his PCP to return to ED if any issues arose after beginning medication. Pt endorses chest pain, SOB, and bloody stools.

## 2021-08-07 NOTE — Consult Note (Signed)
Referring Provider: [facility] Primary Care Physician:  Verne Spurr, MD Primary Gastroenterologist:  unassigned  Reason for Consultation:  Rectal bleeding  HPI: William Powers is a 40 y.o. male history of dilated cardiomyopathy, CHF, HIV, hypertension, polysubstance use presents for rectal bleeding.   Patient was admitted 2 weeks ago for PE and discharged on Xarelto. He states he has been having rectal bleeding ongoing for many months that acutely worsened since starting Xarelto. States it is intermittent, usually dark red blood (has seen bright red blood once). Reports it is usually "a large amount of blood" when it happens and he sees it both in the stool and on the toilet paper. Patient also endorses hematemesis since starting Xarelto, states it is dark red blood. Denies coffee ground emesis. Denies abdominal pain. Denies unintentional weight loss. Denies previous history of colonoscopy. Denies family history of colon cancer.  Patient states he would like to eat. He is not interested in getting a procedure done during this hospital stay. He is scheduled for procedure in November with GI in [place].  Past Medical History:  Diagnosis Date   Heart failure (HCC)    HFrEF (heart failure with reduced ejection fraction) (HCC)    15% EF as of 2019   HIV (human immunodeficiency virus infection) (HCC)    Hypertension    Polysubstance abuse (HCC)     Past Surgical History:  Procedure Laterality Date   HERNIA REPAIR      Prior to Admission medications   Medication Sig Start Date End Date Taking? Authorizing Provider  BIKTARVY 50-200-25 MG TABS tablet Take 1 tablet by mouth daily. 06/12/21  Yes [provider]  eplerenone (INSPRA) 25 MG tablet Take 25 mg by mouth daily. 06/13/20 04/10/22 Yes [provider]  furosemide (LASIX) 40 MG tablet Take 60 mg by mouth daily. 03/26/20 05/10/22 Yes [provider]  metoprolol succinate (TOPROL-XL) 50 MG 24 hr tablet Take 50 mg by mouth 2  (two) times daily. 05/12/21  Yes [provider]  RIVAROXABAN Carlena Hurl) VTE STARTER PACK (15 & 20 MG) Follow package directions: Take one 15mg  tablet by mouth twice a day. On day 22 (08/13/2021), switch to one 20mg  tablet once a day. Take with food. 07/24/21  Yes Rai, Ripudeep K, MD  docusate sodium (COLACE) 100 MG capsule Take 1 capsule (100 mg total) by mouth daily as needed for up to 60 doses for mild constipation. Take with iron to prevent constipation Patient not taking: Reported on 08/07/2021 07/30/21   08/09/2021, MD  ferrous sulfate 325 (65 FE) MG tablet Take 1 tablet (325 mg total) by mouth daily. Patient not taking: No sig reported 07/30/21 08/29/21  09/29/21, MD  rivaroxaban (XARELTO) 20 MG TABS tablet Take 1 tablet (20 mg total) by mouth daily with supper. Please start this prescription after you have completed the starter pack. 08/13/21   Rai, Terald Sleeper, MD    Scheduled Meds:  bictegravir-emtricitabine-tenofovir AF  1 tablet Oral Daily   metoprolol succinate  50 mg Oral BID   potassium chloride  40 mEq Oral Once   spironolactone  25 mg Oral Daily   Continuous Infusions:  0.9 % NaCl with KCl 40 mEq / L     PRN Meds:.acetaminophen **OR** acetaminophen, ondansetron **OR** ondansetron (ZOFRAN) IV  Allergies as of 08/06/2021 - Review Complete 07/28/2021  Allergen Reaction Noted   Bee venom Anaphylaxis 06/23/2013   Lisinopril Swelling 03/02/2020   Sulfamethoxazole-trimethoprim Rash and Swelling 12/22/2018  Family History  Problem Relation Age of Onset   Lupus Mother    HIV Mother    HIV Father     Social History   Socioeconomic History   Marital status: Divorced    Spouse name: Not on file   Number of children: Not on file   Years of education: Not on file   Highest education level: Not on file  Occupational History   Not on file  Tobacco Use   Smoking status: Former    Packs/day: 2.00    Types: Cigarettes   Smokeless tobacco: Never   Vaping Use   Vaping Use: Never used  Substance and Sexual Activity   Alcohol use: Not Currently    Comment: None since 2019   Drug use: Not Currently    Types: Marijuana   Sexual activity: Not on file  Other Topics Concern   Not on file  Social History Narrative   Not on file   Social Determinants of Health   Financial Resource Strain: Not on file  Food Insecurity: Not on file  Transportation Needs: Not on file  Physical Activity: Not on file  Stress: Not on file  Social Connections: Not on file  Intimate Partner Violence: Not on file    Review of Systems: Review of Systems  Constitutional:  Negative for chills and fever.  HENT:  Negative for sinus pain.   Eyes:  Negative for pain.  Respiratory:  Negative for cough and shortness of breath.   Cardiovascular:  Negative for chest pain and palpitations.  Gastrointestinal:  Positive for blood in stool, nausea and vomiting. Negative for abdominal pain, constipation, diarrhea, heartburn and melena.  Genitourinary:  Negative for flank pain and hematuria.  Musculoskeletal:  Negative for falls and joint pain.  Neurological:  Negative for loss of consciousness and weakness.  Psychiatric/Behavioral:  The patient is not nervous/anxious and does not have insomnia.     Physical Exam:Physical Exam Constitutional:      Appearance: Normal appearance.  HENT:     Head: Normocephalic and atraumatic.     Nose: Nose normal. No congestion.     Mouth/Throat:     Mouth: Mucous membranes are moist.     Pharynx: Oropharynx is clear.  Eyes:     General: No scleral icterus.    Extraocular Movements: Extraocular movements intact.     Conjunctiva/sclera: Conjunctivae normal.  Cardiovascular:     Rate and Rhythm: Normal rate and regular rhythm.  Pulmonary:     Effort: Pulmonary effort is normal. No respiratory distress.  Abdominal:     General: Abdomen is flat. Bowel sounds are normal. There is no distension.     Palpations: Abdomen is soft.  There is no mass.     Tenderness: There is no abdominal tenderness. There is no guarding or rebound.     Hernia: No hernia is present.  Musculoskeletal:        General: No swelling. Normal range of motion.     Cervical back: Normal range of motion and neck supple.  Skin:    General: Skin is warm and dry.  Neurological:     General: No focal deficit present.     Mental Status: He is alert and oriented to person, place, and time.  Psychiatric:        Mood and Affect: Mood normal.        Behavior: Behavior normal.        Thought Content: Thought content normal.  Judgment: Judgment normal.    Vital signs: Vitals:   08/07/21 0700 08/07/21 0730  BP: (!) 138/93 (!) 150/90  Pulse: 81 71  Resp:  18  Temp:    SpO2: 97% 100%        GI:  Lab Results: Recent Labs    08/07/21 0027  WBC 6.6  HGB 13.6  HCT 41.0  PLT 292   BMET Recent Labs    08/07/21 0027  NA 135  K 3.0*  CL 102  CO2 21*  GLUCOSE 98  BUN 13  CREATININE 1.12  CALCIUM 8.8*   LFT Recent Labs    08/07/21 0027  PROT 8.3*  ALBUMIN 4.0  AST 30  ALT 29  ALKPHOS 44  BILITOT 0.7   PT/INR Recent Labs    08/07/21 0027  LABPROT 14.5  INR 1.1     Studies/Results: DG Chest Port 1 View  Result Date: 08/07/2021 CLINICAL DATA:  Blood in stool, on Xarelto for PE EXAM: PORTABLE CHEST 1 VIEW COMPARISON:  07/31/2021 FINDINGS: Lungs are clear.  No pleural effusion or pneumothorax. Heart is top-normal in size. IMPRESSION: No evidence of acute cardiopulmonary disease. Electronically Signed   By: Charline Bills M.D.   On: 08/07/2021 01:16    Impression: Rectal bleeding - hgb 13.6 (baseline appears to be 14.0) - BUN 13, Cr 1.12 - CT ab pelvis 07/26/21: no GI abnormality noted.  CHF - ECHO 07/23/21 EF 55 to 60%  Hx of PE - on Xarelto  Plan: Patient's HGB is stable, slightly below baseline but not concerning.  Planning to discharge patient with outpatient procedure that is scheduled.    Recommend patient return to ED if recurrent bleeding.  [facility] will sign off. Please contact us if we can be of any further assistance during this hospital stay.    Spent 40 minutes of this encounter speaking with patient and an additional 20 minutes of encounter discussing case with Dr. Levora Angel. Total time spent: 60 minutes.   LOS: 0 days   Delrico Minehart Leanna Sato  PA-C 08/07/2021, 8:17 AM  Contact #  [phone]

## 2021-08-07 NOTE — ED Provider Notes (Signed)
[facility]-EMERGENCY DEPT Provider Note   CSN: [id number] Arrival date & time: 08/06/21  2342     History Chief Complaint  Patient presents with   Blood In Stools    William Powers is a 40 y.o. male.  The history is provided by the patient.  Rectal Bleeding Quality:  Black and tarry and bright red Amount:  Moderate Duration:  6 days Timing:  Intermittent Chronicity:  New Relieved by:  Nothing Worsened by:  Nothing Associated symptoms: abdominal pain, dizziness, hematemesis, light-headedness and vomiting   Associated symptoms: no fever   Risk factors: anticoagulant use   Patient with history of HIV, hypertension, PE presents with multiple complaints.  Patient reports he was recently diagnosed with PE and is currently on Xarelto He reports for the past 5-6 days he has had multiple episodes of dark stool and bright red blood per rectum.  He also reports he is now vomiting blood.  He also reports hematuria.  He also reports abdominal discomfort. Patient is been seen in the emergency department for this rectal bleeding recently.  Patient reports he has a gastroenterology follow-up next month.  He has not had any recent endoscopy or colonoscopy No recent anal trauma is reported  Patient also reports he had onset of upper back pain and intermittent chest pain, as well as shortness of breath    Past Medical History:  Diagnosis Date   Heart failure (HCC)    HFrEF (heart failure with reduced ejection fraction) (HCC)    15% EF as of 2019   HIV (human immunodeficiency virus infection) (HCC)    Hypertension    Polysubstance abuse [facility])     Patient Active Problem List   Diagnosis Date Noted   Rectal bleeding 08/07/2021   Single subsegmental pulmonary embolism without acute cor pulmonale (HCC) 07/23/2021   Chronic HFrEF (heart failure with reduced ejection fraction) (HCC) 07/23/2021   HIV (human immunodeficiency virus infection) (HCC) 07/23/2021    Past  Surgical History:  Procedure Laterality Date   HERNIA REPAIR         Family History  Problem Relation Age of Onset   Lupus Mother    HIV Mother    HIV Father     Social History   Tobacco Use   Smoking status: Former    Packs/day: 2.00    Types: Cigarettes   Smokeless tobacco: Never  Vaping Use   Vaping Use: Never used  Substance Use Topics   Alcohol use: Not Currently    Comment: None since 2019   Drug use: Not Currently    Types: Marijuana    Home Medications Prior to Admission medications   Medication Sig Start Date End Date Taking? Authorizing Provider  BIKTARVY 50-200-25 MG TABS tablet Take 1 tablet by mouth daily. 06/12/21   [provider]  docusate sodium (COLACE) 100 MG capsule Take 1 capsule (100 mg total) by mouth daily as needed for up to 60 doses for mild constipation. Take with iron to prevent constipation 07/30/21   Terald Sleeper, MD  eplerenone (INSPRA) 25 MG tablet Take 25 mg by mouth daily. 06/13/20 04/10/22  [provider]  ferrous sulfate 325 (65 FE) MG tablet Take 1 tablet (325 mg total) by mouth daily. 07/30/21 08/29/21  Terald Sleeper, MD  furosemide (LASIX) 40 MG tablet Take 60 mg by mouth daily. 03/26/20 05/10/22  [provider]  metoprolol succinate (TOPROL-XL) 50 MG 24 hr tablet Take 50 mg by mouth 2 (two)  times daily. 05/12/21   [provider]  rivaroxaban (XARELTO) 20 MG TABS tablet Take 1 tablet (20 mg total) by mouth daily with supper. Please start this prescription after you have completed the starter pack. 08/13/21   Rai, Delene Ruffini, MD  RIVAROXABAN Carlena Hurl) VTE STARTER PACK (15 & 20 MG) Follow package directions: Take one 15mg  tablet by mouth twice a day. On day 22 (08/13/2021), switch to one 20mg  tablet once a day. Take with food. 07/24/21   Rai, , MD    Allergies    Bee venom, Lisinopril, and Sulfamethoxazole-trimethoprim  Review of Systems   Review of Systems  Constitutional:  Negative  for fever.  Respiratory:  Positive for shortness of breath.   Cardiovascular:  Positive for chest pain.  Gastrointestinal:  Positive for abdominal pain, blood in stool, hematemesis, hematochezia and vomiting.  Genitourinary:  Positive for hematuria.  Neurological:  Positive for dizziness and light-headedness.  All other systems reviewed and are negative.  Physical Exam Updated Vital Signs BP 126/70 (BP Location: Left Arm)   Pulse 84   Temp 98.3 F (36.8 C) (Oral)   Resp 16   Ht 1.905 m (6\' 3" )   Wt 115.7 kg   SpO2 95%   BMI 31.87 kg/m   Physical Exam CONSTITUTIONAL: Well developed/well nourished, no acute distress HEAD: Normocephalic/atraumatic EYES: EOMI/PERRL, conjunctiva pink ENMT: Mucous membranes moist NECK: supple no meningeal signs SPINE/BACK:entire spine nontender CV: S1/S2 noted, no murmurs/rubs/gallops noted LUNGS: Lungs are clear to auscultation bilaterally, no apparent distress ABDOMEN: soft, nontender, no rebound or guarding, bowel sounds noted throughout abdomen GU:no cva tenderness Rectal-no obvious external hemorrhoids.  Stool is light-colored.  No blood.  Hemoccult positive Male nurse tech chaperone present for exam NEURO: Pt is awake/alert/appropriate, moves all extremitiesx4.  No facial droop.  Patient stands without difficulty EXTREMITIES: pulses normal/equal, full ROM SKIN: warm, color normal PSYCH: no abnormalities of mood noted, alert and oriented to situation  ED Results / Procedures / Treatments   Labs (all labs ordered are listed, but only abnormal results are displayed) Labs Reviewed  COMPREHENSIVE METABOLIC PANEL - Abnormal; Notable for the following components:      Result Value   Potassium 3.0 (*)    CO2 21 (*)    Calcium 8.8 (*)    Total Protein 8.3 (*)    All other components within normal limits  POC OCCULT BLOOD, ED - Abnormal; Notable for the following components:   Fecal Occult Bld POSITIVE (*)    All other components within  normal limits  RESP PANEL BY RT-PCR (FLU A&B, COVID) ARPGX2  CBC WITH DIFFERENTIAL/PLATELET  D-DIMER, QUANTITATIVE  PROTIME-INR  URINALYSIS, ROUTINE W REFLEX MICROSCOPIC  TROPONIN I (HIGH SENSITIVITY)  TROPONIN I (HIGH SENSITIVITY)    EKG EKG Interpretation  Date/Time:  Wednesday August 07 2021 00:30:26 EDT Ventricular Rate:  86 PR Interval:  173 QRS Duration: 101 QT Interval:  361 QTC Calculation: 432 R Axis:   64 Text Interpretation: Sinus rhythm Left atrial enlargement RSR' in V1 or V2, right VCD or RVH No significant change since last tracing Confirmed by (Wednesday) on 08/07/2021 12:35:32 AM  Radiology DG Chest Port 1 View  Result Date: 08/07/2021 CLINICAL DATA:  Blood in stool, on Xarelto for PE EXAM: PORTABLE CHEST 1 VIEW COMPARISON:  07/31/2021 FINDINGS: Lungs are clear.  No pleural effusion or pneumothorax. Heart is top-normal in size. IMPRESSION: No evidence of acute cardiopulmonary disease. Electronically Signed   By: 08/09/2021.D.  On: 08/07/2021 01:16    Procedures Procedures   Medications Ordered in ED Medications  acetaminophen (TYLENOL) tablet 650 mg (has no administration in time range)    ED Course  I have reviewed the triage vital signs and the nursing notes.  Pertinent labs & imaging results that were available during my care of the patient were reviewed by me and considered in my medical decision making (see chart for details).    MDM Rules/Calculators/A&P                           Patient presents for 10th ER visit in 6 months Patient was admitted around October 3 for PE.  Since that time he has had up to 4 ER visits for abdominal pain and rectal bleeding.  On the ER visit on October 7, patient had a negative CT abdomen pelvis. Labs are pending at this time 2:31 AM  His Hemoccult was positive Since patient has had multiple ER visits for rectal bleeding, currently on anticoagulation and high risk for acute worsening of a  GI bleed, he will be admitted for monitoring and GI consultation. Discussed with Dr. Toniann Fail for admission Patient resting comfortably at this time Final Clinical Impression(s) / ED Diagnoses Final diagnoses:  Rectal bleeding  Gastrointestinal hemorrhage, unspecified gastrointestinal hemorrhage type    Rx / DC Orders ED Discharge Orders     None        Zadie Rhine, MD 08/07/21 [phone]

## 2021-08-08 ENCOUNTER — Emergency Department (HOSPITAL_COMMUNITY)
Admission: EM | Admit: 2021-08-08 | Discharge: 2021-08-09 | Disposition: A | Discharge status: Home / Self Care | Payer: Medicaid Other | Attending: Emergency Medicine | Admitting: Emergency Medicine

## 2021-08-08 ENCOUNTER — Other Ambulatory Visit: Payer: Self-pay

## 2021-08-08 DIAGNOSIS — Z79899 Other long term (current) drug therapy: Secondary | ICD-10-CM | POA: Insufficient documentation

## 2021-08-08 DIAGNOSIS — Z21 Asymptomatic human immunodeficiency virus [HIV] infection status: Secondary | ICD-10-CM | POA: Diagnosis not present

## 2021-08-08 DIAGNOSIS — Z87891 Personal history of nicotine dependence: Secondary | ICD-10-CM | POA: Insufficient documentation

## 2021-08-08 DIAGNOSIS — I509 Heart failure, unspecified: Secondary | ICD-10-CM | POA: Insufficient documentation

## 2021-08-08 DIAGNOSIS — I110 Hypertensive heart disease with heart failure: Secondary | ICD-10-CM | POA: Diagnosis not present

## 2021-08-08 DIAGNOSIS — K625 Hemorrhage of anus and rectum: Secondary | ICD-10-CM | POA: Insufficient documentation

## 2021-08-08 NOTE — ED Provider Notes (Signed)
Emergency Medicine Provider Triage Evaluation Note  William Powers , a 40 y.o. male  was evaluated in triage.  Pt complains of melena.  The patient reports greater than 4 episodes of melena as well as a 1 episode of hematemesis today.  States that he has been increasingly weak and feeling lightheaded.  No syncope.  He is on Xarelto for PE, but states that his last dose was Saturday at 11 AM.  He was admitted to the hospital, but states that he got frustrated and left.  He reports that he made a mistake and now he is feeling worse.  He denies fever, chills, abdominal pain.  Review of Systems  Positive: Melena, hematemesis, lightheadedness, weakness Negative: Syncope, fever, chills, abdominal pain  Physical Exam  Ht 6\' 3"  (1.905 m)   Wt 117 kg   BMI 32.25 kg/m  Gen:   Awake, no distress   Resp:  Normal effort  MSK:   Moves extremities without difficulty  Other:  Tachycardia, abdomen is nontender.  Medical Decision Making  Medically screening exam initiated at 11:56 PM.  Appropriate orders placed.  William Powers was informed that the remainder of the evaluation will be completed by another provider, this initial triage assessment does not replace that evaluation, and the importance of remaining in the ED until their evaluation is complete.  Saw that the patient was discharged today around noon.  However, he does have some new tachycardia, which is new from previous.  We will repeat labs this patient's hemoglobin has previously been normal.  He will require further work-up and evaluation in the emergency department.   [name], PA-C 08/09/21 0005    08/11/21, MD 08/10/21 [phone]

## 2021-08-08 NOTE — ED Triage Notes (Signed)
Pt c/o black, tarry stools for a while. Pt reports he was taking xarelto but his doctor ordered him to stop taking it for 48 hours.

## 2021-08-09 LAB — CBC WITH DIFFERENTIAL/PLATELET
Abs Immature Granulocytes: 0.02 10*3/uL (ref 0.00–0.07)
Basophils Absolute: 0.1 10*3/uL (ref 0.0–0.1)
Basophils Relative: 1 %
Eosinophils Absolute: 0.3 10*3/uL (ref 0.0–0.5)
Eosinophils Relative: 4 %
HCT: 40.8 % (ref 39.0–52.0)
Hemoglobin: 13.4 g/dL (ref 13.0–17.0)
Immature Granulocytes: 0 %
Lymphocytes Relative: 38 %
Lymphs Abs: 2.8 10*3/uL (ref 0.7–4.0)
MCH: 31.9 pg (ref 26.0–34.0)
MCHC: 32.8 g/dL (ref 30.0–36.0)
MCV: 97.1 fL (ref 80.0–100.0)
Monocytes Absolute: 0.6 10*3/uL (ref 0.1–1.0)
Monocytes Relative: 8 %
Neutro Abs: 3.5 10*3/uL (ref 1.7–7.7)
Neutrophils Relative %: 49 %
Platelets: 350 10*3/uL (ref 150–400)
RBC: 4.2 MIL/uL — ABNORMAL LOW (ref 4.22–5.81)
RDW: 13.7 % (ref 11.5–15.5)
WBC: 7.3 10*3/uL (ref 4.0–10.5)
nRBC: 0 % (ref 0.0–0.2)

## 2021-08-09 LAB — COMPREHENSIVE METABOLIC PANEL
ALT: 33 U/L (ref 0–44)
AST: 34 U/L (ref 15–41)
Albumin: 3.7 g/dL (ref 3.5–5.0)
Alkaline Phosphatase: 52 U/L (ref 38–126)
Anion gap: 13 (ref 5–15)
BUN: 21 mg/dL — ABNORMAL HIGH (ref 6–20)
CO2: 19 mmol/L — ABNORMAL LOW (ref 22–32)
Calcium: 8.8 mg/dL — ABNORMAL LOW (ref 8.9–10.3)
Chloride: 106 mmol/L (ref 98–111)
Creatinine, Ser: 1.42 mg/dL — ABNORMAL HIGH (ref 0.61–1.24)
GFR, Estimated: >60 mL/min (ref >60)
Glucose, Bld: 97 mg/dL (ref 70–99)
Potassium: 3.6 mmol/L (ref 3.5–5.1)
Sodium: 138 mmol/L (ref 135–145)
Total Bilirubin: 0.2 mg/dL — ABNORMAL LOW (ref 0.3–1.2)
Total Protein: 7.6 g/dL (ref 6.5–8.1)

## 2021-08-09 LAB — TYPE AND SCREEN
ABO/RH(D): A POS
Antibody Screen: NEGATIVE

## 2021-08-09 LAB — PROTIME-INR
INR: 0.9 (ref 0.8–1.2)
Prothrombin Time: 12.1 seconds (ref 11.4–15.2)

## 2021-08-09 LAB — POC OCCULT BLOOD, ED: Fecal Occult Bld: POSITIVE — AB
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
## 2021-08-10 ENCOUNTER — Other Ambulatory Visit: Payer: Self-pay

## 2021-08-10 ENCOUNTER — Emergency Department (HOSPITAL_COMMUNITY)
Admission: EM | Admit: 2021-08-10 | Discharge: 2021-08-10 | Disposition: A | Discharge status: Home / Self Care | Payer: Medicaid Other | Attending: Emergency Medicine | Admitting: Emergency Medicine

## 2021-08-10 ENCOUNTER — Encounter (HOSPITAL_COMMUNITY): Payer: Self-pay | Admitting: Emergency Medicine

## 2021-08-10 DIAGNOSIS — I509 Heart failure, unspecified: Secondary | ICD-10-CM | POA: Insufficient documentation

## 2021-08-10 DIAGNOSIS — R109 Unspecified abdominal pain: Secondary | ICD-10-CM | POA: Diagnosis not present

## 2021-08-10 DIAGNOSIS — F4321 Adjustment disorder with depressed mood: Secondary | ICD-10-CM | POA: Diagnosis not present

## 2021-08-10 DIAGNOSIS — I110 Hypertensive heart disease with heart failure: Secondary | ICD-10-CM | POA: Diagnosis not present

## 2021-08-10 DIAGNOSIS — Z87891 Personal history of nicotine dependence: Secondary | ICD-10-CM | POA: Diagnosis not present

## 2021-08-10 DIAGNOSIS — Z79899 Other long term (current) drug therapy: Secondary | ICD-10-CM | POA: Insufficient documentation

## 2021-08-10 DIAGNOSIS — K625 Hemorrhage of anus and rectum: Secondary | ICD-10-CM | POA: Insufficient documentation

## 2021-08-10 DIAGNOSIS — Z86711 Personal history of pulmonary embolism: Secondary | ICD-10-CM | POA: Diagnosis not present

## 2021-08-10 DIAGNOSIS — Z21 Asymptomatic human immunodeficiency virus [HIV] infection status: Secondary | ICD-10-CM | POA: Diagnosis not present

## 2021-08-10 DIAGNOSIS — D689 Coagulation defect, unspecified: Secondary | ICD-10-CM | POA: Insufficient documentation

## 2021-08-10 LAB — TYPE AND SCREEN
ABO/RH(D): A POS
Antibody Screen: NEGATIVE

## 2021-08-10 LAB — HEMOGLOBIN AND HEMATOCRIT, BLOOD
HCT: 38.8 % — ABNORMAL LOW (ref 39.0–52.0)
Hemoglobin: 13.1 g/dL (ref 13.0–17.0)
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
## 2021-08-10 NOTE — Discharge Instructions (Signed)
Your hemoglobin and blood pressure continue to be normal. There is no indication for re-admission tonight. Keep the scheduled appointment you have with GI for further management. You can continue your regularly prescribed medications.

## 2021-08-10 NOTE — ED Triage Notes (Addendum)
Pt reports rectal bleeding for "weeks". Pt on blood thinner. Pt was to be admitted for same on 10/19, but left AMA. Also endorses lower abdominal pain.

## 2021-08-11 ENCOUNTER — Encounter (HOSPITAL_COMMUNITY): Payer: Self-pay

## 2021-08-11 ENCOUNTER — Other Ambulatory Visit: Payer: Self-pay

## 2021-08-11 ENCOUNTER — Emergency Department (HOSPITAL_COMMUNITY)
Admission: EM | Admit: 2021-08-11 | Discharge: 2021-08-11 | Disposition: A | Discharge status: Home / Self Care | Payer: Medicaid Other | Attending: Emergency Medicine | Admitting: Emergency Medicine

## 2021-08-11 ENCOUNTER — Emergency Department (HOSPITAL_COMMUNITY): Payer: Medicaid Other

## 2021-08-11 DIAGNOSIS — Z21 Asymptomatic human immunodeficiency virus [HIV] infection status: Secondary | ICD-10-CM | POA: Diagnosis not present

## 2021-08-11 DIAGNOSIS — I110 Hypertensive heart disease with heart failure: Secondary | ICD-10-CM | POA: Diagnosis not present

## 2021-08-11 DIAGNOSIS — R079 Chest pain, unspecified: Secondary | ICD-10-CM | POA: Insufficient documentation

## 2021-08-11 DIAGNOSIS — F1910 Other psychoactive substance abuse, uncomplicated: Secondary | ICD-10-CM | POA: Diagnosis not present

## 2021-08-11 DIAGNOSIS — F909 Attention-deficit hyperactivity disorder, unspecified type: Secondary | ICD-10-CM | POA: Insufficient documentation

## 2021-08-11 DIAGNOSIS — R45851 Suicidal ideations: Secondary | ICD-10-CM

## 2021-08-11 DIAGNOSIS — F17210 Nicotine dependence, cigarettes, uncomplicated: Secondary | ICD-10-CM | POA: Insufficient documentation

## 2021-08-11 DIAGNOSIS — Y906 Blood alcohol level of 120-199 mg/100 ml: Secondary | ICD-10-CM | POA: Insufficient documentation

## 2021-08-11 DIAGNOSIS — Z79899 Other long term (current) drug therapy: Secondary | ICD-10-CM | POA: Insufficient documentation

## 2021-08-11 DIAGNOSIS — R0602 Shortness of breath: Secondary | ICD-10-CM

## 2021-08-11 DIAGNOSIS — I509 Heart failure, unspecified: Secondary | ICD-10-CM | POA: Insufficient documentation

## 2021-08-11 DIAGNOSIS — F4321 Adjustment disorder with depressed mood: Secondary | ICD-10-CM

## 2021-08-11 DIAGNOSIS — Z20822 Contact with and (suspected) exposure to covid-19: Secondary | ICD-10-CM | POA: Diagnosis not present

## 2021-08-11 LAB — RESP PANEL BY RT-PCR (FLU A&B, COVID) ARPGX2
Influenza A by PCR: NEGATIVE
Influenza B by PCR: NEGATIVE
SARS Coronavirus 2 by RT PCR: NEGATIVE

## 2021-08-11 LAB — COMPREHENSIVE METABOLIC PANEL
ALT: 30 U/L (ref 0–44)
AST: 32 U/L (ref 15–41)
Albumin: 3.6 g/dL (ref 3.5–5.0)
Alkaline Phosphatase: 46 U/L (ref 38–126)
Anion gap: 11 (ref 5–15)
BUN: 16 mg/dL (ref 6–20)
CO2: 20 mmol/L — ABNORMAL LOW (ref 22–32)
Calcium: 8.8 mg/dL — ABNORMAL LOW (ref 8.9–10.3)
Chloride: 107 mmol/L (ref 98–111)
Creatinine, Ser: 1.37 mg/dL — ABNORMAL HIGH (ref 0.61–1.24)
GFR, Estimated: >60 mL/min (ref >60)
Glucose, Bld: 87 mg/dL (ref 70–99)
Potassium: 3.7 mmol/L (ref 3.5–5.1)
Sodium: 138 mmol/L (ref 135–145)
Total Bilirubin: 0.7 mg/dL (ref 0.3–1.2)
Total Protein: 7.5 g/dL (ref 6.5–8.1)

## 2021-08-11 LAB — RAPID URINE DRUG SCREEN, HOSP PERFORMED
Amphetamines: NOT DETECTED
Barbiturates: NOT DETECTED
Benzodiazepines: NOT DETECTED
Cocaine: POSITIVE — AB
Opiates: NOT DETECTED
Tetrahydrocannabinol: POSITIVE — AB

## 2021-08-11 LAB — CBC
HCT: 39.9 % (ref 39.0–52.0)
Hemoglobin: 13.3 g/dL (ref 13.0–17.0)
MCH: 32.2 pg (ref 26.0–34.0)
MCHC: 33.3 g/dL (ref 30.0–36.0)
MCV: 96.6 fL (ref 80.0–100.0)
Platelets: 348 10*3/uL (ref 150–400)
RBC: 4.13 MIL/uL — ABNORMAL LOW (ref 4.22–5.81)
RDW: 13.6 % (ref 11.5–15.5)
WBC: 8.3 10*3/uL (ref 4.0–10.5)
nRBC: 0 % (ref 0.0–0.2)

## 2021-08-11 LAB — TROPONIN I (HIGH SENSITIVITY)
Troponin I (High Sensitivity): 4 ng/L (ref <18)
Troponin I (High Sensitivity): 4 ng/L (ref <18)

## 2021-08-11 LAB — ACETAMINOPHEN LEVEL: Acetaminophen (Tylenol), Serum: <10 ug/mL — ABNORMAL LOW (ref 10–30)

## 2021-08-11 LAB — SALICYLATE LEVEL: Salicylate Lvl: <7 mg/dL — ABNORMAL LOW (ref 7.0–30.0)

## 2021-08-11 LAB — ETHANOL: Alcohol, Ethyl (B): 147 mg/dL — ABNORMAL HIGH (ref <10)

## 2021-08-11 MED ORDER — ZIPRASIDONE MESYLATE 20 MG IM SOLR: 20.0000 mg | Freq: Once | INTRAMUSCULAR | Status: DC | PRN

## 2021-08-11 MED ORDER — BICTEGRAVIR-EMTRICITAB-TENOFOV 50-200-25 MG PO TABS
1.0000 | ORAL_TABLET | Freq: Every day | ORAL | Status: DC
  Administered 2021-08-11: 1 via ORAL
  Filled 2021-08-11: qty 1

## 2021-08-11 MED ORDER — ALBUTEROL SULFATE HFA 108 (90 BASE) MCG/ACT IN AERS: 2.0000 | INHALATION_SPRAY | RESPIRATORY_TRACT | Status: DC | PRN

## 2021-08-11 MED ORDER — RIVAROXABAN 15 MG PO TABS
15.0000 mg | ORAL_TABLET | Freq: Once | ORAL | Status: AC
  Administered 2021-08-11: 15 mg via ORAL
  Filled 2021-08-11: qty 1

## 2021-08-11 MED ORDER — ACETAMINOPHEN 325 MG PO TABS: 650.0000 mg | ORAL_TABLET | ORAL | Status: DC | PRN

## 2021-08-11 MED ORDER — METOPROLOL SUCCINATE ER 25 MG PO TB24
50.0000 mg | ORAL_TABLET | Freq: Two times a day (BID) | ORAL | Status: DC
  Administered 2021-08-11: 50 mg via ORAL
  Filled 2021-08-11: qty 2

## 2021-08-11 MED ORDER — FUROSEMIDE 20 MG PO TABS
60.0000 mg | ORAL_TABLET | Freq: Every day | ORAL | Status: DC
  Administered 2021-08-11: 60 mg via ORAL
  Filled 2021-08-11: qty 3

## 2021-08-11 NOTE — ED Provider Notes (Addendum)
Patient evaluated by psychiatry team and cleared for continued outpatient care.  Advised return if he has worsening symptoms or any additional concerns.    Cheryll Cockayne, MD 08/11/21 1109    Cheryll Cockayne, MD 08/11/21 [phone]

## 2021-08-11 NOTE — ED Notes (Signed)
Pt belongings returned to pt. DC papers provided and pt verbalized understanding

## 2021-08-11 NOTE — ED Notes (Signed)
Pt agreeable and compliant at this time
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
## 2021-08-11 NOTE — ED Notes (Signed)
Breakfast tray at bedside 

## 2021-08-11 NOTE — Discharge Instructions (Signed)
Call your primary care doctor or specialist as discussed in the next 2-3 days.   Return immediately back to the ER if:  Your symptoms worsen within the next 12-24 hours. You develop new symptoms such as new fevers, persistent vomiting, new pain, shortness of breath, or new weakness or numbness, or if you have any other concerns.  

## 2021-08-11 NOTE — ED Notes (Signed)
Pt now reports chest pain. Troponin added to lab orders. EKG already obtained.

## 2021-08-11 NOTE — ED Notes (Signed)
Breakfast and lunch tray ordered

## 2021-08-11 NOTE — ED Triage Notes (Signed)
PER EMS: pt picked up from a hotel with c/o SOB, was here 2 days ago and received diagnosis of "blood clot in my lungs" but he left AMA. A&OX4. Denies pain.   BP - 118/64, HR-83, 98% RA.   During triage, the patient stated "Im tired of coming here with the same symptoms. If I cant get this figured out I am taking myself out."

## 2021-08-11 NOTE — ED Notes (Signed)
When asked to elaborate on his comment about what he meant when he said he would take himself out, he stated that he would plan to end his life if he doesn't get answers about his symptoms. When asked if he had a plan he stated "yeah I do but Im not going to tell you, this is serious stuff."
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
## 2021-08-11 NOTE — ED Notes (Signed)
Pt very upset. Pt demanding clothes and wanting to leave. Process was explained to pt d/t being SI at one time he needs to be psych cleared before clothes will be returned. IVC paperwork was established and events of process was explained to pt. Pt compliant at this time

## 2021-08-14 ENCOUNTER — Other Ambulatory Visit: Payer: Self-pay

## 2021-08-14 ENCOUNTER — Emergency Department (HOSPITAL_COMMUNITY)
Admission: EM | Admit: 2021-08-14 | Discharge: 2021-08-14 | Disposition: A | Discharge status: Home / Self Care | Payer: Medicaid Other | Attending: Emergency Medicine | Admitting: Emergency Medicine

## 2021-08-14 DIAGNOSIS — Z7901 Long term (current) use of anticoagulants: Secondary | ICD-10-CM | POA: Diagnosis not present

## 2021-08-14 DIAGNOSIS — K625 Hemorrhage of anus and rectum: Secondary | ICD-10-CM | POA: Insufficient documentation

## 2021-08-14 DIAGNOSIS — Z5321 Procedure and treatment not carried out due to patient leaving prior to being seen by health care provider: Secondary | ICD-10-CM | POA: Insufficient documentation

## 2021-08-14 LAB — I-STAT CHEM 8, ED
BUN: 15 mg/dL (ref 6–20)
Calcium, Ion: 1.15 mmol/L (ref 1.15–1.40)
Chloride: 109 mmol/L (ref 98–111)
Creatinine, Ser: 1.5 mg/dL — ABNORMAL HIGH (ref 0.61–1.24)
Glucose, Bld: 102 mg/dL — ABNORMAL HIGH (ref 70–99)
HCT: 42 % (ref 39.0–52.0)
Hemoglobin: 14.3 g/dL (ref 13.0–17.0)
Potassium: 3.9 mmol/L (ref 3.5–5.1)
Sodium: 143 mmol/L (ref 135–145)
TCO2: 24 mmol/L (ref 22–32)

## 2021-08-14 NOTE — ED Triage Notes (Signed)
Pt c/o bloody stool x1. Takes Xarelto. States compliance with medication. Pt states this is a recurrent problem and has been assessed for the same prior.

## 2021-08-14 NOTE — ED Provider Notes (Signed)
Emergency Medicine Provider Triage Evaluation Note  William Powers , a 40 y.o. male  was evaluated in triage.  Pt complains of bloody stool. Reports BRBPR with bowel movements "every 5 minutes". Has some pain with BMs as well. No N/V, fevers. On Xarelto and reports compliance. He has been evaluated x 9 in the ED in the past 3.5 weeks, mostly for similar c/o blood in stools.  Review of Systems  Positive: As above Negative: As above  Physical Exam  There were no vitals taken for this visit. Gen:   Awake, no distress   Resp:  Normal effort  MSK:   Moves extremities without difficulty  Other:  Mild tachycardia  Medical Decision Making  Medically screening exam initiated at 2:15 AM.  Appropriate orders placed.  [name] was informed that the remainder of the evaluation will be completed by another provider, this initial triage assessment does not replace that evaluation, and the importance of remaining in the ED until their evaluation is complete.  BRBPR - will ensure stable H/H. Vitals reassuring.   Antony Madura, PA-C 08/14/21 Harlon Ditty, MD 08/14/21 [phone]

## 2021-08-17 ENCOUNTER — Other Ambulatory Visit: Payer: Self-pay

## 2021-08-17 ENCOUNTER — Emergency Department (HOSPITAL_COMMUNITY)
Admission: EM | Admit: 2021-08-17 | Discharge: 2021-08-17 | Disposition: A | Discharge status: Home / Self Care | Payer: Medicaid Other | Attending: Emergency Medicine | Admitting: Emergency Medicine

## 2021-08-17 ENCOUNTER — Emergency Department (HOSPITAL_COMMUNITY)
Admission: EM | Admit: 2021-08-17 | Discharge: 2021-08-18 | Disposition: A | Discharge status: Home / Self Care | Payer: Medicaid Other | Source: Home / Self Care

## 2021-08-17 ENCOUNTER — Encounter (HOSPITAL_COMMUNITY): Payer: Self-pay | Admitting: Emergency Medicine

## 2021-08-17 ENCOUNTER — Emergency Department (HOSPITAL_COMMUNITY): Payer: Medicaid Other

## 2021-08-17 ENCOUNTER — Encounter (HOSPITAL_COMMUNITY): Payer: Self-pay

## 2021-08-17 DIAGNOSIS — R0602 Shortness of breath: Secondary | ICD-10-CM | POA: Diagnosis present

## 2021-08-17 DIAGNOSIS — Z7901 Long term (current) use of anticoagulants: Secondary | ICD-10-CM | POA: Insufficient documentation

## 2021-08-17 DIAGNOSIS — Z5321 Procedure and treatment not carried out due to patient leaving prior to being seen by health care provider: Secondary | ICD-10-CM | POA: Insufficient documentation

## 2021-08-17 DIAGNOSIS — R0789 Other chest pain: Secondary | ICD-10-CM | POA: Diagnosis not present

## 2021-08-17 DIAGNOSIS — K625 Hemorrhage of anus and rectum: Secondary | ICD-10-CM | POA: Insufficient documentation

## 2021-08-17 DIAGNOSIS — R000 Tachycardia, unspecified: Secondary | ICD-10-CM | POA: Insufficient documentation

## 2021-08-17 HISTORY — DX: Other pulmonary embolism without acute cor pulmonale: I26.99

## 2021-08-17 LAB — CBC WITH DIFFERENTIAL/PLATELET
Abs Immature Granulocytes: 0.04 10*3/uL (ref 0.00–0.07)
Abs Immature Granulocytes: 0.04 10*3/uL (ref 0.00–0.07)
Basophils Absolute: 0.1 10*3/uL (ref 0.0–0.1)
Basophils Absolute: 0 10*3/uL (ref 0.0–0.1)
Basophils Relative: 1 %
Basophils Relative: 0 %
Eosinophils Absolute: 0.1 10*3/uL (ref 0.0–0.5)
Eosinophils Absolute: 0.2 10*3/uL (ref 0.0–0.5)
Eosinophils Relative: 1 %
Eosinophils Relative: 2 %
HCT: 44.2 % (ref 39.0–52.0)
HCT: 43.7 % (ref 39.0–52.0)
Hemoglobin: 14.5 g/dL (ref 13.0–17.0)
Hemoglobin: 14.5 g/dL (ref 13.0–17.0)
Immature Granulocytes: 0 %
Immature Granulocytes: 0 %
Lymphocytes Relative: 29 %
Lymphocytes Relative: 23 %
Lymphs Abs: 2.5 10*3/uL (ref 0.7–4.0)
Lymphs Abs: 2.3 10*3/uL (ref 0.7–4.0)
MCH: 32 pg (ref 26.0–34.0)
MCH: 32.2 pg (ref 26.0–34.0)
MCHC: 32.8 g/dL (ref 30.0–36.0)
MCHC: 33.2 g/dL (ref 30.0–36.0)
MCV: 97.6 fL (ref 80.0–100.0)
MCV: 96.9 fL (ref 80.0–100.0)
Monocytes Absolute: 0.9 10*3/uL (ref 0.1–1.0)
Monocytes Absolute: 0.7 10*3/uL (ref 0.1–1.0)
Monocytes Relative: 10 %
Monocytes Relative: 7 %
Neutro Abs: 5.3 10*3/uL (ref 1.7–7.7)
Neutro Abs: 6.5 10*3/uL (ref 1.7–7.7)
Neutrophils Relative %: 59 %
Neutrophils Relative %: 68 %
Platelets: 408 10*3/uL — ABNORMAL HIGH (ref 150–400)
Platelets: 396 10*3/uL (ref 150–400)
RBC: 4.53 MIL/uL (ref 4.22–5.81)
RBC: 4.51 MIL/uL (ref 4.22–5.81)
RDW: 14.2 % (ref 11.5–15.5)
RDW: 14.1 % (ref 11.5–15.5)
WBC: 8.9 10*3/uL (ref 4.0–10.5)
WBC: 9.7 10*3/uL (ref 4.0–10.5)
nRBC: 0 % (ref 0.0–0.2)
nRBC: 0 % (ref 0.0–0.2)

## 2021-08-17 LAB — COMPREHENSIVE METABOLIC PANEL
ALT: 30 U/L (ref 0–44)
ALT: 25 U/L (ref 0–44)
AST: 32 U/L (ref 15–41)
AST: 36 U/L (ref 15–41)
Albumin: 4.1 g/dL (ref 3.5–5.0)
Albumin: 3.9 g/dL (ref 3.5–5.0)
Alkaline Phosphatase: 44 U/L (ref 38–126)
Alkaline Phosphatase: 56 U/L (ref 38–126)
Anion gap: 8 (ref 5–15)
Anion gap: 11 (ref 5–15)
BUN: 15 mg/dL (ref 6–20)
BUN: 14 mg/dL (ref 6–20)
CO2: 24 mmol/L (ref 22–32)
CO2: 21 mmol/L — ABNORMAL LOW (ref 22–32)
Calcium: 9.1 mg/dL (ref 8.9–10.3)
Calcium: 9.1 mg/dL (ref 8.9–10.3)
Chloride: 102 mmol/L (ref 98–111)
Chloride: 106 mmol/L (ref 98–111)
Creatinine, Ser: 1.29 mg/dL — ABNORMAL HIGH (ref 0.61–1.24)
Creatinine, Ser: 1.43 mg/dL — ABNORMAL HIGH (ref 0.61–1.24)
GFR, Estimated: >60 mL/min (ref >60)
GFR, Estimated: >60 mL/min (ref >60)
Glucose, Bld: 105 mg/dL — ABNORMAL HIGH (ref 70–99)
Glucose, Bld: 111 mg/dL — ABNORMAL HIGH (ref 70–99)
Potassium: 3.7 mmol/L (ref 3.5–5.1)
Potassium: 3.8 mmol/L (ref 3.5–5.1)
Sodium: 134 mmol/L — ABNORMAL LOW (ref 135–145)
Sodium: 138 mmol/L (ref 135–145)
Total Bilirubin: 0.9 mg/dL (ref 0.3–1.2)
Total Bilirubin: 0.6 mg/dL (ref 0.3–1.2)
Total Protein: 8.5 g/dL — ABNORMAL HIGH (ref 6.5–8.1)
Total Protein: 8.2 g/dL — ABNORMAL HIGH (ref 6.5–8.1)

## 2021-08-17 LAB — PROTIME-INR
INR: 1.2 (ref 0.8–1.2)
Prothrombin Time: 14.8 seconds (ref 11.4–15.2)

## 2021-08-17 NOTE — ED Provider Notes (Signed)
Emergency Medicine Provider Triage Evaluation Note  William Powers , a 40 y.o. male  was evaluated in triage.  Pt complains of bright red blood per rectum.  He has been evaluated multiple times over the past month for this.  He is anticoagulated.    Review of Systems  Positive: Bright red blood per rectum Negative: syncope  Physical Exam  BP 120/90 (BP Location: Right Arm)   Pulse (!) 101   Temp 98.3 F (36.8 C) (Oral)   Resp 16   Ht 6\' 3"  (1.905 m)   Wt 125 kg   SpO2 99%   BMI 34.44 kg/m  Gen:   Awake, no distress   Resp:  Normal effort  MSK:   Moves extremities without difficulty  Other:  GU exam deferred.   Medical Decision Making  Medically screening exam initiated at 10:28 PM.  Appropriate orders placed.  William Powers was informed that the remainder of the evaluation will be completed by another provider, this initial triage assessment does not replace that evaluation, and the importance of remaining in the ED until their evaluation is complete.  Patient had multiple sets of labs over the past month.  He is slightly tachy today so will repeat cbc.     William Rancher, PA-C 08/17/21 2230    08/19/21, MD 08/18/21 [phone]

## 2021-08-17 NOTE — ED Triage Notes (Signed)
Pt BIB EMS. Pt was dx with a PE 2 weeks ago and is now taking Xarelto. Pt was walking and became short of breath with pain 2/10 to his left lung.

## 2021-08-17 NOTE — ED Provider Notes (Signed)
Emergency Medicine Provider Triage Evaluation Note  William Powers , a 40 y.o. male  was evaluated in triage.  Pt complains of sob.  Pt with  hx/o PE comes in for SOB that started while walking to work this morning, improved on ED evaluation.  Mild chest tightness.  No fever, vomiting.  He is compliant with his Xarelto (recently diagnosed with PE).   Review of Systems  Positive: sob Negative: Fever, leg swelling, cough  Physical Exam  BP (!) 147/103   Pulse 85   Temp 97.9 F (36.6 C)   Resp (!) 24   SpO2 100%  Gen:   Awake, no distress   Resp:  Normal effort  MSK:   Moves extremities without difficulty    Medical Decision Making  Medically screening exam initiated at 6:18 AM.  Appropriate orders placed.  William Powers was informed that the remainder of the evaluation will be completed by another provider, this initial triage assessment does not replace that evaluation, and the importance of remaining in the ED until their evaluation is complete.     Tilden Fossa, MD 08/17/21 [phone]

## 2021-08-17 NOTE — ED Triage Notes (Signed)
Patient reports persistent bloody stools for 3 weeks , he is taking Xarelto for PE.

## 2021-08-17 NOTE — ED Notes (Signed)
Patient left before labs could be drawn.

## 2021-08-17 NOTE — ED Notes (Signed)
Labeled specimen cup provided to pt for urine collection per MD order. ENMiles 

## 2021-08-18 NOTE — ED Notes (Signed)
Sort called to update vitals and no response

## 2021-08-18 NOTE — ED Notes (Signed)
William Powers said he is leaving due  to long wait.

## 2021-08-20 ENCOUNTER — Emergency Department (HOSPITAL_COMMUNITY)
Admission: EM | Admit: 2021-08-20 | Discharge: 2021-08-21 | Disposition: A | Discharge status: Home / Self Care | Payer: Medicaid Other | Attending: Emergency Medicine | Admitting: Emergency Medicine

## 2021-08-20 ENCOUNTER — Other Ambulatory Visit: Payer: Self-pay

## 2021-08-20 ENCOUNTER — Encounter (HOSPITAL_COMMUNITY): Payer: Self-pay | Admitting: Emergency Medicine

## 2021-08-20 DIAGNOSIS — K625 Hemorrhage of anus and rectum: Secondary | ICD-10-CM | POA: Insufficient documentation

## 2021-08-20 DIAGNOSIS — K6289 Other specified diseases of anus and rectum: Secondary | ICD-10-CM | POA: Diagnosis present

## 2021-08-20 DIAGNOSIS — Z79899 Other long term (current) drug therapy: Secondary | ICD-10-CM | POA: Diagnosis not present

## 2021-08-20 DIAGNOSIS — R1030 Lower abdominal pain, unspecified: Secondary | ICD-10-CM | POA: Diagnosis not present

## 2021-08-20 DIAGNOSIS — Z5321 Procedure and treatment not carried out due to patient leaving prior to being seen by health care provider: Secondary | ICD-10-CM | POA: Insufficient documentation

## 2021-08-20 DIAGNOSIS — I110 Hypertensive heart disease with heart failure: Secondary | ICD-10-CM | POA: Insufficient documentation

## 2021-08-20 DIAGNOSIS — I509 Heart failure, unspecified: Secondary | ICD-10-CM | POA: Insufficient documentation

## 2021-08-20 DIAGNOSIS — R195 Other fecal abnormalities: Secondary | ICD-10-CM | POA: Insufficient documentation

## 2021-08-20 DIAGNOSIS — F17210 Nicotine dependence, cigarettes, uncomplicated: Secondary | ICD-10-CM | POA: Insufficient documentation

## 2021-08-20 DIAGNOSIS — Z7901 Long term (current) use of anticoagulants: Secondary | ICD-10-CM | POA: Diagnosis not present

## 2021-08-20 NOTE — ED Triage Notes (Signed)
Pt reports pain in his rectum when he is trying to have a bowel movement.  Pt reports bright red stools when he does have a bowel movement.

## 2021-08-20 NOTE — ED Provider Notes (Signed)
Emergency Medicine Provider Triage Evaluation Note  William Powers , a 40 y.o. male  was evaluated in triage.  Pt complains of rectal pain and bleeding.  Only painful when have BMs.  Denies diarrhea.  Denies any masses.  Review of Systems  Positive: Rectal bleeding and pain Negative: Fever, vomiting  Physical Exam  BP 134/88   Pulse 100   Temp 97.7 F (36.5 C) (Oral)   Resp 14   Ht 6\' 4"  (1.93 m)   Wt 117 kg   SpO2 99%   BMI 31.40 kg/m  Gen:   Awake, no distress   Resp:  Normal effort  MSK:   Moves extremities without difficulty  Other:  No external hemorrhoid, no obvious fissure or tear  Medical Decision Making  Medically screening exam initiated at 11:30 PM.  Appropriate orders placed.  William Powers was informed that the remainder of the evaluation will be completed by another provider, this initial triage assessment does not replace that evaluation, and the importance of remaining in the ED until their evaluation is complete.  Rectal pain   [name], PA-C 08/20/21 2331    13/01/22, MD 08/23/21 [phone]

## 2021-08-21 ENCOUNTER — Emergency Department (HOSPITAL_COMMUNITY)
Admission: EM | Admit: 2021-08-21 | Discharge: 2021-08-22 | Disposition: A | Discharge status: Home / Self Care | Payer: Medicaid Other | Source: Home / Self Care | Attending: Emergency Medicine | Admitting: Emergency Medicine

## 2021-08-21 ENCOUNTER — Encounter (HOSPITAL_COMMUNITY): Payer: Self-pay

## 2021-08-21 DIAGNOSIS — I110 Hypertensive heart disease with heart failure: Secondary | ICD-10-CM | POA: Insufficient documentation

## 2021-08-21 DIAGNOSIS — K625 Hemorrhage of anus and rectum: Secondary | ICD-10-CM

## 2021-08-21 DIAGNOSIS — I10 Essential (primary) hypertension: Secondary | ICD-10-CM | POA: Insufficient documentation

## 2021-08-21 DIAGNOSIS — I509 Heart failure, unspecified: Secondary | ICD-10-CM | POA: Insufficient documentation

## 2021-08-21 DIAGNOSIS — Z7901 Long term (current) use of anticoagulants: Secondary | ICD-10-CM | POA: Insufficient documentation

## 2021-08-21 DIAGNOSIS — F17210 Nicotine dependence, cigarettes, uncomplicated: Secondary | ICD-10-CM | POA: Insufficient documentation

## 2021-08-21 DIAGNOSIS — R195 Other fecal abnormalities: Secondary | ICD-10-CM | POA: Insufficient documentation

## 2021-08-21 DIAGNOSIS — R1030 Lower abdominal pain, unspecified: Secondary | ICD-10-CM | POA: Insufficient documentation

## 2021-08-21 DIAGNOSIS — Z79899 Other long term (current) drug therapy: Secondary | ICD-10-CM | POA: Insufficient documentation

## 2021-08-21 LAB — BASIC METABOLIC PANEL
Anion gap: 12 (ref 5–15)
BUN: 15 mg/dL (ref 6–20)
CO2: 20 mmol/L — ABNORMAL LOW (ref 22–32)
Calcium: 9 mg/dL (ref 8.9–10.3)
Chloride: 105 mmol/L (ref 98–111)
Creatinine, Ser: 1.2 mg/dL (ref 0.61–1.24)
GFR, Estimated: >60 mL/min (ref >60)
Glucose, Bld: 90 mg/dL (ref 70–99)
Potassium: 3.6 mmol/L (ref 3.5–5.1)
Sodium: 137 mmol/L (ref 135–145)

## 2021-08-21 LAB — CBC WITH DIFFERENTIAL/PLATELET
Abs Immature Granulocytes: 0.03 10*3/uL (ref 0.00–0.07)
Basophils Absolute: 0.1 10*3/uL (ref 0.0–0.1)
Basophils Relative: 1 %
Eosinophils Absolute: 0.1 10*3/uL (ref 0.0–0.5)
Eosinophils Relative: 2 %
HCT: 44.1 % (ref 39.0–52.0)
Hemoglobin: 14.5 g/dL (ref 13.0–17.0)
Immature Granulocytes: 0 %
Lymphocytes Relative: 37 %
Lymphs Abs: 2.8 10*3/uL (ref 0.7–4.0)
MCH: 32.4 pg (ref 26.0–34.0)
MCHC: 32.9 g/dL (ref 30.0–36.0)
MCV: 98.4 fL (ref 80.0–100.0)
Monocytes Absolute: 0.6 10*3/uL (ref 0.1–1.0)
Monocytes Relative: 8 %
Neutro Abs: 4 10*3/uL (ref 1.7–7.7)
Neutrophils Relative %: 52 %
Platelets: 332 10*3/uL (ref 150–400)
RBC: 4.48 MIL/uL (ref 4.22–5.81)
RDW: 14.2 % (ref 11.5–15.5)
WBC: 7.7 10*3/uL (ref 4.0–10.5)
nRBC: 0 % (ref 0.0–0.2)

## 2021-08-21 NOTE — ED Notes (Signed)
Clled patient for vital check patient didn't answer

## 2021-08-21 NOTE — ED Triage Notes (Signed)
Patient arrives from home with complaint of fluid build up in his calves and multiple episodes of diarrhea starting earlier today. Pt reports inability to control his bowels with pain 9/10 in is rectum. Pt also endorses bright red blood from rectum.

## 2021-08-22 ENCOUNTER — Encounter (HOSPITAL_COMMUNITY): Payer: Self-pay

## 2021-08-22 ENCOUNTER — Emergency Department (HOSPITAL_COMMUNITY): Payer: Medicaid Other

## 2021-08-22 LAB — COMPREHENSIVE METABOLIC PANEL
ALT: 28 U/L (ref 0–44)
AST: 31 U/L (ref 15–41)
Albumin: 4 g/dL (ref 3.5–5.0)
Alkaline Phosphatase: 52 U/L (ref 38–126)
Anion gap: 9 (ref 5–15)
BUN: 18 mg/dL (ref 6–20)
CO2: 21 mmol/L — ABNORMAL LOW (ref 22–32)
Calcium: 8.7 mg/dL — ABNORMAL LOW (ref 8.9–10.3)
Chloride: 108 mmol/L (ref 98–111)
Creatinine, Ser: 1.27 mg/dL — ABNORMAL HIGH (ref 0.61–1.24)
GFR, Estimated: >60 mL/min (ref >60)
Glucose, Bld: 103 mg/dL — ABNORMAL HIGH (ref 70–99)
Potassium: 3.6 mmol/L (ref 3.5–5.1)
Sodium: 138 mmol/L (ref 135–145)
Total Bilirubin: 0.6 mg/dL (ref 0.3–1.2)
Total Protein: 7.9 g/dL (ref 6.5–8.1)

## 2021-08-22 LAB — CBC
HCT: 40.6 % (ref 39.0–52.0)
Hemoglobin: 13.7 g/dL (ref 13.0–17.0)
MCH: 33.2 pg (ref 26.0–34.0)
MCHC: 33.7 g/dL (ref 30.0–36.0)
MCV: 98.3 fL (ref 80.0–100.0)
Platelets: 296 10*3/uL (ref 150–400)
RBC: 4.13 MIL/uL — ABNORMAL LOW (ref 4.22–5.81)
RDW: 14.4 % (ref 11.5–15.5)
WBC: 6.8 10*3/uL (ref 4.0–10.5)
nRBC: 0 % (ref 0.0–0.2)

## 2021-08-22 LAB — TYPE AND SCREEN
ABO/RH(D): A POS
Antibody Screen: NEGATIVE

## 2021-08-22 LAB — POC OCCULT BLOOD, ED: Fecal Occult Bld: NEGATIVE

## 2021-08-22 MED ORDER — SODIUM CHLORIDE (PF) 0.9 % IJ SOLN
INTRAMUSCULAR | Status: AC
  Filled 2021-08-22: qty 50

## 2021-08-22 MED ORDER — SODIUM CHLORIDE 0.9 % IV BOLUS
1000.0000 mL | Freq: Once | INTRAVENOUS | Status: AC
  Administered 2021-08-22: 1000 mL via INTRAVENOUS

## 2021-08-22 MED ORDER — IOHEXOL 350 MG/ML SOLN
80.0000 mL | Freq: Once | INTRAVENOUS | Status: AC | PRN
  Administered 2021-08-22: 80 mL via INTRAVENOUS
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
## 2021-08-22 NOTE — ED Notes (Signed)
Pt care taken, said that he has bright red blood, and has hard stools.

## 2021-08-22 NOTE — Discharge Instructions (Signed)
Follow-up with gastroenterology to discuss colonoscopy.  The contact information for [facility] has been provided in this discharge summary for you to call and make these arrangements.

## 2021-08-26 ENCOUNTER — Other Ambulatory Visit: Payer: Self-pay

## 2021-08-26 ENCOUNTER — Encounter (HOSPITAL_COMMUNITY): Payer: Self-pay | Admitting: Emergency Medicine

## 2021-08-26 ENCOUNTER — Emergency Department (HOSPITAL_COMMUNITY)
Admission: EM | Admit: 2021-08-26 | Discharge: 2021-08-27 | Disposition: A | Discharge status: Home / Self Care | Payer: Medicaid Other | Attending: Emergency Medicine | Admitting: Emergency Medicine

## 2021-08-26 DIAGNOSIS — F17210 Nicotine dependence, cigarettes, uncomplicated: Secondary | ICD-10-CM | POA: Diagnosis not present

## 2021-08-26 DIAGNOSIS — K602 Anal fissure, unspecified: Secondary | ICD-10-CM | POA: Insufficient documentation

## 2021-08-26 DIAGNOSIS — R32 Unspecified urinary incontinence: Secondary | ICD-10-CM | POA: Diagnosis present

## 2021-08-26 DIAGNOSIS — Z21 Asymptomatic human immunodeficiency virus [HIV] infection status: Secondary | ICD-10-CM | POA: Diagnosis not present

## 2021-08-26 DIAGNOSIS — I10 Essential (primary) hypertension: Secondary | ICD-10-CM | POA: Insufficient documentation

## 2021-08-26 LAB — CBC WITH DIFFERENTIAL/PLATELET
Abs Immature Granulocytes: 0.02 10*3/uL (ref 0.00–0.07)
Basophils Absolute: 0 10*3/uL (ref 0.0–0.1)
Basophils Relative: 0 %
Eosinophils Absolute: 0.2 10*3/uL (ref 0.0–0.5)
Eosinophils Relative: 3 %
HCT: 40.8 % (ref 39.0–52.0)
Hemoglobin: 13.6 g/dL (ref 13.0–17.0)
Immature Granulocytes: 0 %
Lymphocytes Relative: 31 %
Lymphs Abs: 2.3 10*3/uL (ref 0.7–4.0)
MCH: 32.6 pg (ref 26.0–34.0)
MCHC: 33.3 g/dL (ref 30.0–36.0)
MCV: 97.8 fL (ref 80.0–100.0)
Monocytes Absolute: 0.6 10*3/uL (ref 0.1–1.0)
Monocytes Relative: 8 %
Neutro Abs: 4.4 10*3/uL (ref 1.7–7.7)
Neutrophils Relative %: 58 %
Platelets: 295 10*3/uL (ref 150–400)
RBC: 4.17 MIL/uL — ABNORMAL LOW (ref 4.22–5.81)
RDW: 14.3 % (ref 11.5–15.5)
WBC: 7.6 10*3/uL (ref 4.0–10.5)
nRBC: 0 % (ref 0.0–0.2)

## 2021-08-26 NOTE — ED Triage Notes (Signed)
Patient reports intermittent blood in stools for several months , he takes Xarelto.

## 2021-08-26 NOTE — ED Provider Notes (Signed)
Emergency Medicine Provider Triage Evaluation Note  William Powers , a 40 y.o. male  was evaluated in triage.  Pt complains of blood in his stool. Patient report this has been occurring for months, but typically resolved before he get see a medical provider. Today he has had 8 episodes of diarrhea mixed with bright red blood with some mild lower abdominal pain. Takes xarelto. Denies nsaid/etoh use.   Review of Systems  Positive: BRBPR, abdominal pain Negative: Vomiting, dizziness, syncope.   Physical Exam  BP 105/66 (BP Location: Right Arm)   Pulse 93   Temp 98 F (36.7 C)   Resp 19   SpO2 96%  Gen:   Awake, no distress   Resp:  Normal effort  MSK:   Moves extremities without difficulty  Other:  No peritoneal signs on abdominal exam.   Medical Decision Making  Medically screening exam initiated at 11:04 PM.  Appropriate orders placed.  William Powers was informed that the remainder of the evaluation will be completed by another provider, this initial triage assessment does not replace that evaluation, and the importance of remaining in the ED until their evaluation is complete.  Blood in stool    Cherly Anderson, PA-C 08/26/21 2305    Terald Sleeper, MD 08/26/21 [phone]

## 2021-08-27 LAB — COMPREHENSIVE METABOLIC PANEL
ALT: 26 U/L (ref 0–44)
AST: 30 U/L (ref 15–41)
Albumin: 3.6 g/dL (ref 3.5–5.0)
Alkaline Phosphatase: 47 U/L (ref 38–126)
Anion gap: 14 (ref 5–15)
BUN: 17 mg/dL (ref 6–20)
CO2: 20 mmol/L — ABNORMAL LOW (ref 22–32)
Calcium: 9 mg/dL (ref 8.9–10.3)
Chloride: 107 mmol/L (ref 98–111)
Creatinine, Ser: 1.5 mg/dL — ABNORMAL HIGH (ref 0.61–1.24)
GFR, Estimated: 60 mL/min — ABNORMAL LOW (ref >60)
Glucose, Bld: 108 mg/dL — ABNORMAL HIGH (ref 70–99)
Potassium: 3.6 mmol/L (ref 3.5–5.1)
Sodium: 141 mmol/L (ref 135–145)
Total Bilirubin: 0.9 mg/dL (ref 0.3–1.2)
Total Protein: 7.2 g/dL (ref 6.5–8.1)

## 2021-08-27 MED ORDER — HYDROCORTISONE (PERIANAL) 2.5 % EX CREA: 1.0000 "application " | TOPICAL_CREAM | Freq: Two times a day (BID) | CUTANEOUS | 0 refills | Status: DC

## 2021-08-27 MED ORDER — DOCUSATE SODIUM 100 MG PO CAPS: 100.0000 mg | ORAL_CAPSULE | Freq: Two times a day (BID) | ORAL | 0 refills | Status: DC

## 2021-08-27 NOTE — Discharge Instructions (Signed)
In addition to putting the Anusol back around her rectum then get zinc oxide and put it on top to protect your skin.  You may need to start wearing a depends to absorb the urine so it is not sitting on your skin breaking the area down.  Also start taking the Colace to help with the hard stool.  You need to see a regular doctor and then they can help you get into see the cardiologist, infectious disease, urologist and gastroenterologist as needed.  Continue to take the medications you are currently on.

## 2021-08-27 NOTE — ED Notes (Signed)
Pt verbalized understanding of d/c instructions, meds, and followup care. Denies questions. VSS, no distress noted. Steady gait to exit with all belongings.  ?
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
## 2021-08-28 ENCOUNTER — Emergency Department (HOSPITAL_COMMUNITY)
Admission: EM | Admit: 2021-08-28 | Discharge: 2021-08-28 | Disposition: A | Discharge status: Home / Self Care | Payer: Medicaid Other | Attending: Emergency Medicine | Admitting: Emergency Medicine

## 2021-08-28 ENCOUNTER — Emergency Department (HOSPITAL_COMMUNITY): Payer: Medicaid Other

## 2021-08-28 DIAGNOSIS — Z79899 Other long term (current) drug therapy: Secondary | ICD-10-CM | POA: Insufficient documentation

## 2021-08-28 DIAGNOSIS — K625 Hemorrhage of anus and rectum: Secondary | ICD-10-CM | POA: Diagnosis not present

## 2021-08-28 DIAGNOSIS — Z20822 Contact with and (suspected) exposure to covid-19: Secondary | ICD-10-CM | POA: Insufficient documentation

## 2021-08-28 DIAGNOSIS — R0602 Shortness of breath: Secondary | ICD-10-CM | POA: Insufficient documentation

## 2021-08-28 DIAGNOSIS — F17210 Nicotine dependence, cigarettes, uncomplicated: Secondary | ICD-10-CM | POA: Insufficient documentation

## 2021-08-28 DIAGNOSIS — R5381 Other malaise: Secondary | ICD-10-CM | POA: Diagnosis not present

## 2021-08-28 DIAGNOSIS — I509 Heart failure, unspecified: Secondary | ICD-10-CM | POA: Diagnosis not present

## 2021-08-28 DIAGNOSIS — I110 Hypertensive heart disease with heart failure: Secondary | ICD-10-CM | POA: Insufficient documentation

## 2021-08-28 DIAGNOSIS — R519 Headache, unspecified: Secondary | ICD-10-CM | POA: Insufficient documentation

## 2021-08-28 DIAGNOSIS — R110 Nausea: Secondary | ICD-10-CM | POA: Diagnosis not present

## 2021-08-28 DIAGNOSIS — R5383 Other fatigue: Secondary | ICD-10-CM | POA: Diagnosis present

## 2021-08-28 DIAGNOSIS — Z21 Asymptomatic human immunodeficiency virus [HIV] infection status: Secondary | ICD-10-CM | POA: Diagnosis not present

## 2021-08-28 DIAGNOSIS — Z7901 Long term (current) use of anticoagulants: Secondary | ICD-10-CM | POA: Insufficient documentation

## 2021-08-28 DIAGNOSIS — Z765 Malingerer [conscious simulation]: Secondary | ICD-10-CM | POA: Diagnosis not present

## 2021-08-28 DIAGNOSIS — R45851 Suicidal ideations: Secondary | ICD-10-CM | POA: Diagnosis not present

## 2021-08-28 DIAGNOSIS — I5022 Chronic systolic (congestive) heart failure: Secondary | ICD-10-CM | POA: Diagnosis not present

## 2021-08-28 DIAGNOSIS — R42 Dizziness and giddiness: Secondary | ICD-10-CM | POA: Diagnosis not present

## 2021-08-28 LAB — CBC WITH DIFFERENTIAL/PLATELET
Abs Immature Granulocytes: 0.02 10*3/uL (ref 0.00–0.07)
Basophils Absolute: 0 10*3/uL (ref 0.0–0.1)
Basophils Relative: 1 %
Eosinophils Absolute: 0.1 10*3/uL (ref 0.0–0.5)
Eosinophils Relative: 1 %
HCT: 39.5 % (ref 39.0–52.0)
Hemoglobin: 13.2 g/dL (ref 13.0–17.0)
Immature Granulocytes: 0 %
Lymphocytes Relative: 31 %
Lymphs Abs: 2 10*3/uL (ref 0.7–4.0)
MCH: 32.3 pg (ref 26.0–34.0)
MCHC: 33.4 g/dL (ref 30.0–36.0)
MCV: 96.6 fL (ref 80.0–100.0)
Monocytes Absolute: 0.5 10*3/uL (ref 0.1–1.0)
Monocytes Relative: 8 %
Neutro Abs: 4 10*3/uL (ref 1.7–7.7)
Neutrophils Relative %: 59 %
Platelets: 274 10*3/uL (ref 150–400)
RBC: 4.09 MIL/uL — ABNORMAL LOW (ref 4.22–5.81)
RDW: 13.9 % (ref 11.5–15.5)
WBC: 6.6 10*3/uL (ref 4.0–10.5)
nRBC: 0 % (ref 0.0–0.2)

## 2021-08-28 LAB — COMPREHENSIVE METABOLIC PANEL
ALT: 27 U/L (ref 0–44)
AST: 28 U/L (ref 15–41)
Albumin: 3.5 g/dL (ref 3.5–5.0)
Alkaline Phosphatase: 39 U/L (ref 38–126)
Anion gap: 13 (ref 5–15)
BUN: 13 mg/dL (ref 6–20)
CO2: 21 mmol/L — ABNORMAL LOW (ref 22–32)
Calcium: 8.8 mg/dL — ABNORMAL LOW (ref 8.9–10.3)
Chloride: 102 mmol/L (ref 98–111)
Creatinine, Ser: 1.1 mg/dL (ref 0.61–1.24)
GFR, Estimated: >60 mL/min (ref >60)
Glucose, Bld: 92 mg/dL (ref 70–99)
Potassium: 3.4 mmol/L — ABNORMAL LOW (ref 3.5–5.1)
Sodium: 136 mmol/L (ref 135–145)
Total Bilirubin: 0.7 mg/dL (ref 0.3–1.2)
Total Protein: 7.1 g/dL (ref 6.5–8.1)

## 2021-08-28 LAB — RESP PANEL BY RT-PCR (FLU A&B, COVID) ARPGX2
Influenza A by PCR: NEGATIVE
Influenza B by PCR: NEGATIVE
SARS Coronavirus 2 by RT PCR: NEGATIVE

## 2021-08-28 LAB — PROTIME-INR
INR: 1.1 (ref 0.8–1.2)
Prothrombin Time: 13.7 seconds (ref 11.4–15.2)

## 2021-08-28 LAB — D-DIMER, QUANTITATIVE: D-Dimer, Quant: <0.27 ug/mL-FEU (ref 0.00–0.50)

## 2021-08-28 LAB — POC OCCULT BLOOD, ED: Fecal Occult Bld: NEGATIVE

## 2021-08-28 MED ORDER — METOPROLOL SUCCINATE ER 25 MG PO TB24
50.0000 mg | ORAL_TABLET | Freq: Two times a day (BID) | ORAL | Status: DC
  Administered 2021-08-28: 50 mg via ORAL
  Filled 2021-08-28: qty 2

## 2021-08-28 MED ORDER — RIVAROXABAN 10 MG PO TABS: 20.0000 mg | ORAL_TABLET | Freq: Every day | ORAL | Status: DC

## 2021-08-28 MED ORDER — BICTEGRAVIR-EMTRICITAB-TENOFOV 50-200-25 MG PO TABS
1.0000 | ORAL_TABLET | Freq: Every day | ORAL | Status: DC
  Administered 2021-08-28: 1 via ORAL
  Filled 2021-08-28: qty 1

## 2021-08-28 NOTE — ED Triage Notes (Signed)
BIBEMS, original call was for SOB, patient states he has hx of blood clot on lung one month ago. Patient also endorses bloody diarrhea for "months" Patient also states he has defecated on himself 5 times today. Patient also endorses suicidal thoughts, patient states "I am just tired of this shit. I just want to take myself out" Patient states no plan but states "Just the first thing that will take me out"  Vitals with EMS: 132/98 HR: 118

## 2021-08-28 NOTE — ED Notes (Signed)
PT belongings placed in locker 12. Valuables with security.

## 2021-08-28 NOTE — ED Notes (Signed)
Belongings returned and signed for at this time

## 2021-08-28 NOTE — Discharge Instructions (Signed)
Go to your doctor appointment as planned.  Follow-up with your doctors for the suicidal thoughts

## 2021-08-28 NOTE — ED Notes (Signed)
Patient requesting to leave. States he has a doctor appointment at 1pm that he needs to get to. MD made aware with psych counselor and NP. Patient calm and cooperative at this time
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
## 2021-08-28 NOTE — ED Provider Notes (Signed)
  Physical Exam  BP 112/78   Pulse 88   Temp 98.4 F (36.9 C) (Oral)   Resp 18   SpO2 96%   Physical Exam  ED Course/Procedures     Procedures  MDM  Reevaluated patient.  States he is feeling much better.  Denies having made the suicidal statements.  States that something was dealing with previously.  Does have 20 ER visits for sometimes similar symptoms.  Medically cleared.  I think there are some chronic issues going on but I do not think he has acute risk to himself.  States he has psychiatry follow-up with.  Will discharge home.  Has not had TTS evaluation but is not willing to stay and I do not think I have criteria to IVC him right now with him now denying suicidal thoughts.       Benjiman Core, MD 08/28/21 1222

## 2021-08-29 ENCOUNTER — Emergency Department (HOSPITAL_COMMUNITY)
Admission: EM | Admit: 2021-08-29 | Discharge: 2021-08-29 | Disposition: A | Discharge status: Home / Self Care | Payer: Medicaid Other | Source: Home / Self Care | Attending: Emergency Medicine | Admitting: Emergency Medicine

## 2021-08-29 ENCOUNTER — Ambulatory Visit (HOSPITAL_COMMUNITY)
Admission: EM | Admit: 2021-08-29 | Discharge: 2021-08-29 | Disposition: A | Discharge status: Other Acute Inpatient Hospital | Payer: Medicaid Other

## 2021-08-29 ENCOUNTER — Emergency Department (HOSPITAL_COMMUNITY)
Admission: EM | Admit: 2021-08-29 | Discharge: 2021-08-29 | Disposition: A | Discharge status: Home / Self Care | Payer: Medicaid Other | Attending: Emergency Medicine | Admitting: Emergency Medicine

## 2021-08-29 ENCOUNTER — Other Ambulatory Visit: Payer: Self-pay

## 2021-08-29 ENCOUNTER — Encounter (HOSPITAL_COMMUNITY): Payer: Self-pay

## 2021-08-29 ENCOUNTER — Encounter (HOSPITAL_COMMUNITY): Payer: Self-pay | Admitting: Emergency Medicine

## 2021-08-29 DIAGNOSIS — R5381 Other malaise: Secondary | ICD-10-CM

## 2021-08-29 DIAGNOSIS — Z765 Malingerer [conscious simulation]: Secondary | ICD-10-CM | POA: Insufficient documentation

## 2021-08-29 DIAGNOSIS — R110 Nausea: Secondary | ICD-10-CM | POA: Insufficient documentation

## 2021-08-29 DIAGNOSIS — F17210 Nicotine dependence, cigarettes, uncomplicated: Secondary | ICD-10-CM | POA: Insufficient documentation

## 2021-08-29 DIAGNOSIS — Z21 Asymptomatic human immunodeficiency virus [HIV] infection status: Secondary | ICD-10-CM | POA: Insufficient documentation

## 2021-08-29 DIAGNOSIS — R42 Dizziness and giddiness: Secondary | ICD-10-CM | POA: Insufficient documentation

## 2021-08-29 DIAGNOSIS — Z79899 Other long term (current) drug therapy: Secondary | ICD-10-CM | POA: Insufficient documentation

## 2021-08-29 DIAGNOSIS — R45851 Suicidal ideations: Secondary | ICD-10-CM | POA: Insufficient documentation

## 2021-08-29 DIAGNOSIS — I5022 Chronic systolic (congestive) heart failure: Secondary | ICD-10-CM | POA: Insufficient documentation

## 2021-08-29 DIAGNOSIS — I110 Hypertensive heart disease with heart failure: Secondary | ICD-10-CM | POA: Insufficient documentation

## 2021-08-29 DIAGNOSIS — Z7901 Long term (current) use of anticoagulants: Secondary | ICD-10-CM | POA: Insufficient documentation

## 2021-08-29 DIAGNOSIS — Z20822 Contact with and (suspected) exposure to covid-19: Secondary | ICD-10-CM | POA: Insufficient documentation

## 2021-08-29 LAB — COMPREHENSIVE METABOLIC PANEL
ALT: 28 U/L (ref 0–44)
AST: 31 U/L (ref 15–41)
Albumin: 3.9 g/dL (ref 3.5–5.0)
Alkaline Phosphatase: 43 U/L (ref 38–126)
Anion gap: 9 (ref 5–15)
BUN: 15 mg/dL (ref 6–20)
CO2: 22 mmol/L (ref 22–32)
Calcium: 8.9 mg/dL (ref 8.9–10.3)
Chloride: 104 mmol/L (ref 98–111)
Creatinine, Ser: 0.93 mg/dL (ref 0.61–1.24)
GFR, Estimated: >60 mL/min (ref >60)
Glucose, Bld: 101 mg/dL — ABNORMAL HIGH (ref 70–99)
Potassium: 3.7 mmol/L (ref 3.5–5.1)
Sodium: 135 mmol/L (ref 135–145)
Total Bilirubin: 0.7 mg/dL (ref 0.3–1.2)
Total Protein: 8 g/dL (ref 6.5–8.1)

## 2021-08-29 LAB — RAPID URINE DRUG SCREEN, HOSP PERFORMED
Amphetamines: NOT DETECTED
Barbiturates: NOT DETECTED
Benzodiazepines: NOT DETECTED
Cocaine: POSITIVE — AB
Opiates: NOT DETECTED
Tetrahydrocannabinol: NOT DETECTED

## 2021-08-29 LAB — RESP PANEL BY RT-PCR (FLU A&B, COVID) ARPGX2
Influenza A by PCR: NEGATIVE
Influenza B by PCR: NEGATIVE
SARS Coronavirus 2 by RT PCR: NEGATIVE

## 2021-08-29 LAB — CBC
HCT: 41.4 % (ref 39.0–52.0)
Hemoglobin: 13.8 g/dL (ref 13.0–17.0)
MCH: 32.5 pg (ref 26.0–34.0)
MCHC: 33.3 g/dL (ref 30.0–36.0)
MCV: 97.6 fL (ref 80.0–100.0)
Platelets: 294 10*3/uL (ref 150–400)
RBC: 4.24 MIL/uL (ref 4.22–5.81)
RDW: 14.1 % (ref 11.5–15.5)
WBC: 6.5 10*3/uL (ref 4.0–10.5)
nRBC: 0 % (ref 0.0–0.2)

## 2021-08-29 LAB — ETHANOL: Alcohol, Ethyl (B): <10 mg/dL (ref <10)

## 2021-08-29 LAB — ACETAMINOPHEN LEVEL: Acetaminophen (Tylenol), Serum: <10 ug/mL — ABNORMAL LOW (ref 10–30)

## 2021-08-29 LAB — SALICYLATE LEVEL: Salicylate Lvl: <7 mg/dL — ABNORMAL LOW (ref 7.0–30.0)

## 2021-08-29 MED ORDER — METOPROLOL SUCCINATE ER 50 MG PO TB24
50.0000 mg | ORAL_TABLET | Freq: Two times a day (BID) | ORAL | Status: DC
  Administered 2021-08-29: 50 mg via ORAL
  Filled 2021-08-29: qty 1

## 2021-08-29 MED ORDER — FUROSEMIDE 40 MG PO TABS
60.0000 mg | ORAL_TABLET | Freq: Every day | ORAL | Status: DC
  Administered 2021-08-29: 60 mg via ORAL
  Filled 2021-08-29: qty 2

## 2021-08-29 MED ORDER — EPLERENONE 25 MG PO TABS: 25.0000 mg | ORAL_TABLET | Freq: Every day | ORAL | 0 refills | Status: DC

## 2021-08-29 MED ORDER — RIVAROXABAN 20 MG PO TABS
20.0000 mg | ORAL_TABLET | Freq: Every day | ORAL | Status: DC
  Filled 2021-08-29: qty 1

## 2021-08-29 MED ORDER — HYDROCORTISONE (PERIANAL) 2.5 % EX CREA: 1.0000 "application " | TOPICAL_CREAM | Freq: Two times a day (BID) | CUTANEOUS | Status: DC

## 2021-08-29 MED ORDER — NICOTINE 21 MG/24HR TD PT24
21.0000 mg | MEDICATED_PATCH | Freq: Every day | TRANSDERMAL | Status: DC
  Filled 2021-08-29: qty 1

## 2021-08-29 MED ORDER — METOPROLOL SUCCINATE ER 50 MG PO TB24: 50.0000 mg | ORAL_TABLET | Freq: Two times a day (BID) | ORAL | 0 refills | Status: DC

## 2021-08-29 MED ORDER — FUROSEMIDE 40 MG PO TABS: 60.0000 mg | ORAL_TABLET | Freq: Every day | ORAL | 0 refills | Status: DC

## 2021-08-29 MED ORDER — DOCUSATE SODIUM 100 MG PO CAPS: 100.0000 mg | ORAL_CAPSULE | Freq: Two times a day (BID) | ORAL | Status: DC

## 2021-08-29 MED ORDER — BIKTARVY 50-200-25 MG PO TABS: 1.0000 | ORAL_TABLET | Freq: Every day | ORAL | 0 refills | Status: DC

## 2021-08-29 MED ORDER — SPIRONOLACTONE 25 MG PO TABS
25.0000 mg | ORAL_TABLET | Freq: Every day | ORAL | Status: DC
  Filled 2021-08-29: qty 1

## 2021-08-29 MED ORDER — BICTEGRAVIR-EMTRICITAB-TENOFOV 50-200-25 MG PO TABS
1.0000 | ORAL_TABLET | Freq: Every day | ORAL | Status: DC
Start: 2021-08-29 — End: 2021-08-29
  Administered 2021-08-29: 1 via ORAL
  Filled 2021-08-29: qty 1

## 2021-08-29 MED ORDER — ACETAMINOPHEN 325 MG PO TABS: 650.0000 mg | ORAL_TABLET | ORAL | Status: DC | PRN

## 2021-08-29 NOTE — Progress Notes (Signed)
   08/29/21 0245  [facility] Triage Screening (Walk-ins at [facility] only)  How Did You Hear About [place]? Self  What Is the Reason for Your Visit/Call Today? William Powers is a 40 year old male presenting voluntary to [facility] due to SI with plan. When asked about plan, patient stated "anyway possible". Patient reported onset of SI was 2 days ago. Patient reported stressors/triggers include "everything, poor health". Patient reported constantly worrying, thinking about past stating "killing myself would make things better, then I want have to worry as much". When talking about suicide, patient stated "tonight is the night". Patient reported having access to guns if he needed them. Patient reported worsening depressive symptoms. Patient denied prior psych hospitalizations and suicide attempts. Patient denied receiving any outpatient mental health services. Patient denied support system. Patient denied being prescribed any psych medications. Patient was calm and cooperative during assessment. Patient is unable to contract for safety.  How Long Has This Been Causing You Problems? 1 wk - 1 month  Have You Recently Had Any Thoughts About Hurting Yourself? Yes  How long ago did you have thoughts about hurting yourself? At the present  Are You Planning to Commit Suicide/Harm Yourself At This time? Yes  Have you Recently Had Thoughts About Hurting Someone [name]? No  Are You Planning To Harm Someone At This Time? No  Are you currently experiencing any auditory, visual or other hallucinations? No  Have You Used Any Alcohol or Drugs in the Past 24 Hours? No  What Did You Use and How Much? denied  Do you have any current medical co-morbidities that require immediate attention? Yes  Please describe current medical co-morbidities that require immediate attention: multiple  Clinician description of patient physical appearance/behavior: casual and cooperative  What Do You Feel Would Help You the Most Today? Treatment for Depression or  other mood problem  If access to [facility] was not available, would you have sought care in the Emergency Department? Yes  Determination of Need Urgent (48 hours)  Options For Referral Outpatient Therapy;Medication Management;Inpatient Hospitalization;Facility-Based Crisis

## 2021-08-29 NOTE — ED Notes (Signed)
Pt sent with safe transport to [facility] ED for further evaluation.
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
## 2021-08-29 NOTE — Consult Note (Signed)
  Patient was seen and assessed by behavioral health urgent care, just a few hours prior to his arrival to [facility] emergency department.  Prior to his disposition and behavioral health urgent care, patient began to endorse suicidal ideations with a plan, in which she was sent to the emergency department for any safety.  Prior to his previous assessment patient was also seen at [facility] emergency department with a complaint of malaise and missing medication.   Patient is seen and assessed by this nurse practitioner, in which he vaguely denies having any current suicidal ideations.  When asked the patient when his reason for coming to the hospital he states" I was having suicidal ideations.  But I do not need to be here.  I mean you can let me go now if you want to."  Patient is unable to further elaborate as to why he visited two previous psychiatric facilities prior to this presentation to [facility] emergency department.  He is observed to be smiling and having a grin on his face, as he is observed to be eating his breakfast.  When asking for any other questions or concerns, patient states" I just need you to cut that light off.  The light is hurting my eyes it is really bright."  Patient continues to refute any current or active suicidal ideations at this time.  He further denies any homicidal ideations, and or auditory or visual hallucinations.  Patient appears to be future oriented and motivated to seek outpatient treatment.  Will psychiatrically clear at this time.  Please see and refer to complete psychiatric specialty examination and comprehensive clinical assessment completed just hours before he presented here for further documentation.  -Patient is psych cleared.

## 2021-08-29 NOTE — ED Notes (Signed)
Called patient for mse form, no response

## 2021-08-29 NOTE — ED Provider Notes (Signed)
Behavioral Health Urgent Care Medical Screening Exam  Patient Name: William Powers MRN: [id number] Date of Evaluation: 08/29/21 Chief Complaint:   Diagnosis:  Final diagnoses:  Malingering    History of Present illness: William Powers is a 40 y.o. male. Patient presented voluntarily to [facility] with chief complaint of passive suicidal ideation.   Patient seem face to face and his chart reviewed by this NP. Patient is alert and oriented X 4. He is speaking in normal tone at moderate pace with good eye contact. Patient's mood iss euthymic with congruent affect. He is currently denying all medical complain.   Patient reports "I have a lot going on and I was thinking of hurting myself." Patient denies current suicidal ideation, plans or intent although he informed TTS Counselor earlier that he had SI with plans to hurt himself by "anyway possible." Patient identify his health as his main trigger. Patient has been evaluated in multiple EDs over the past few days for multiple health complaints. He also presented to [facility] 1 hour prior to this assessment with complaint of malaise and missing medication; he left [facility] prior to been discharged.   Patient denies history of depression and current depressive symptom although he presented with passive suicidal complaint. Patient denies homicidal ideation, hallucination, paranoia, and no delusional thought content noted. Patient endorses THC use "once in a while." He denies all other substance abuse however his UDS was positive for cocaine on 08/11/21.   Discussed outpatient services with patient. Patient begin to report suicidal ideation with plans to harm himself once he was informed he will be discharged with plans to follow up with outpatient resources.   Psychiatric Specialty Exam  Presentation  General Appearance:Appropriate for Environment  Eye Contact:Good  Speech:Clear and Coherent  Speech Volume:Normal  Handedness:Right   Mood and Affect   Mood:Depressed  Affect:Congruent   Thought Process  Thought Processes:Coherent  Descriptions of Associations:Intact  Orientation:Full (Time, Place and Person)  Thought Content:WDL  Diagnosis of Schizophrenia or Schizoaffective disorder in past: No   Hallucinations:None  Ideas of Reference:None  Suicidal Thoughts:Yes, Passive Without Intent; Without Plan  Homicidal Thoughts:No   Sensorium  Memory:Immediate Good; Recent Good; Remote Poor  Judgment:Fair  Insight:Good   Executive Functions  Concentration:Good  Attention Span:Good  Recall:Good  Fund of Knowledge:Good  Language:Good   Psychomotor Activity  Psychomotor Activity:Normal   Assets  Assets:Communication Skills; Desire for Improvement; Physical Health   Sleep  Sleep:Fair  Number of hours: No data recorded  No data recorded  Physical Exam: Physical Exam Vitals and nursing note reviewed.  Constitutional:      Appearance: He is well-developed.  HENT:     Head: Normocephalic and atraumatic.  Eyes:     Conjunctiva/sclera: Conjunctivae normal.  Cardiovascular:     Rate and Rhythm: Normal rate.  Pulmonary:     Effort: Pulmonary effort is normal. No respiratory distress.     Breath sounds: Normal breath sounds.  Abdominal:     Palpations: Abdomen is soft.     Tenderness: There is no abdominal tenderness.  Musculoskeletal:        General: Swelling present.     Cervical back: Neck supple.  Skin:    General: Skin is warm and dry.  Neurological:     Mental Status: He is alert and oriented to person, place, and time.  Psychiatric:        Attention and Perception: Attention and perception normal.        Mood and Affect: Mood and affect  normal.        Speech: Speech normal.        Behavior: Behavior normal. Behavior is cooperative.        Thought Content: Thought content normal.   Review of Systems  Constitutional: Negative.   HENT: Negative.    Eyes: Negative.   Respiratory:  Negative.    Cardiovascular: Negative.   Gastrointestinal: Negative.   Genitourinary: Negative.   Musculoskeletal: Negative.   Skin: Negative.   Neurological: Negative.   Endo/Heme/Allergies: Negative.   Psychiatric/Behavioral:  Positive for suicidal ideas.   Blood pressure 127/88, pulse 86, temperature 97.8 F (36.6 C), resp. rate 20, SpO2 100 %. There is no height or weight on file to calculate BMI.  Musculoskeletal: Strength & Muscle Tone: within normal limits Gait & Station: normal Patient leans: Right   [facility] MSE Discharge Disposition for Follow up and Recommendations: Patient presented initially with passive SI without intent or plan. Discussed outpatient services with patient. Patient begin to report suicidal ideation with plans to harm himself once he was informed he will be discharged with plans to follow up with outpatient resources. patient reports that he feels unsafe to be discharge. He decline this writer's recommendation for discharge and requesting to be transfer to [facility]. Although there is suspicion for secondary gain, patient will be transfer to  [facility] for safety.    Maricela Bo, NP 08/29/2021, 5:57 AM
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
## 2021-08-29 NOTE — Discharge Instructions (Addendum)
You are encouraged to follow up with [facility] for outpatient treatment. ° °Walk in/ Open Access Hours: °Monday - Friday 8AM - 11AM (To see provider and therapist) - Arrive around 7 or 7:15 to have a better chance of being seen, as slots fill up.   °Friday - 1PM - 4PM (To see therapist only) ° °[facility] °[address] °[place], [place] °[phone] ° °Discharge recommendations:  °Patient is to take medications as prescribed. °Please see information for follow-up appointment with psychiatry and therapy. °Please follow up with your primary care provider for all medical related needs.  ° °Therapy: We recommend that patient participate in individual therapy to address mental health concerns. ° °Medications: The parent/guardian is to contact a medical professional and/or outpatient provider to address any new side effects that develop. Parent/guardian should update outpatient providers of any new medications and/or medication changes.  ° °Safety:  °The patient should abstain from use of illicit substances/drugs and abuse of any medications. °If symptoms worsen or do not continue to improve or if the patient becomes actively suicidal or homicidal then it is recommended that the patient return to the closest hospital emergency department, the [facility], or call 911 for further evaluation and treatment. °National Suicide Prevention Lifeline 1-800-SUICIDE or 1-800-273-8255. ° °About 988 °988 offers 24/7 access to trained crisis counselors who can help people experiencing mental health-related distress. People can call or text 988 or chat 988lifeline.org for themselves or if they are worried about a loved one who may need crisis support. ° °

## 2021-08-29 NOTE — ED Notes (Signed)
Report called to Christin Fudge, RN at [facility] ED for pt transfer.

## 2021-08-29 NOTE — ED Triage Notes (Signed)
Pt arrives via [employer] from [place] with c/o suicidal ideation. Pt sts he has several ways of harming himself but doesn't want to disclose. Says he has not taken any medications from yesterday since they were collected and locked up.

## 2021-08-29 NOTE — ED Triage Notes (Signed)
Pt states he feels unwell because he has not had his medication as he left it here last night.  Pt also states he needs to eat something.  Pharmacy called and they do not have any medications that were turned in for this patient.

## 2021-08-31 ENCOUNTER — Emergency Department (HOSPITAL_COMMUNITY)
Admission: EM | Admit: 2021-08-31 | Discharge: 2021-08-31 | Disposition: A | Discharge status: Home / Self Care | Payer: Medicaid Other | Attending: Emergency Medicine | Admitting: Emergency Medicine

## 2021-08-31 ENCOUNTER — Encounter (HOSPITAL_COMMUNITY): Payer: Self-pay | Admitting: Emergency Medicine

## 2021-08-31 ENCOUNTER — Encounter (HOSPITAL_COMMUNITY): Payer: Self-pay

## 2021-08-31 ENCOUNTER — Other Ambulatory Visit: Payer: Self-pay

## 2021-08-31 ENCOUNTER — Emergency Department (HOSPITAL_COMMUNITY)
Admission: EM | Admit: 2021-08-31 | Discharge: 2021-09-01 | Disposition: A | Discharge status: Home / Self Care | Payer: Medicaid Other | Source: Home / Self Care | Attending: Emergency Medicine | Admitting: Emergency Medicine

## 2021-08-31 ENCOUNTER — Emergency Department (HOSPITAL_COMMUNITY): Payer: Medicaid Other

## 2021-08-31 DIAGNOSIS — R0602 Shortness of breath: Secondary | ICD-10-CM

## 2021-08-31 DIAGNOSIS — F17210 Nicotine dependence, cigarettes, uncomplicated: Secondary | ICD-10-CM | POA: Insufficient documentation

## 2021-08-31 DIAGNOSIS — Z79899 Other long term (current) drug therapy: Secondary | ICD-10-CM | POA: Diagnosis not present

## 2021-08-31 DIAGNOSIS — Z20822 Contact with and (suspected) exposure to covid-19: Secondary | ICD-10-CM | POA: Diagnosis not present

## 2021-08-31 DIAGNOSIS — Z7901 Long term (current) use of anticoagulants: Secondary | ICD-10-CM | POA: Insufficient documentation

## 2021-08-31 DIAGNOSIS — Z21 Asymptomatic human immunodeficiency virus [HIV] infection status: Secondary | ICD-10-CM | POA: Diagnosis not present

## 2021-08-31 DIAGNOSIS — I5022 Chronic systolic (congestive) heart failure: Secondary | ICD-10-CM | POA: Diagnosis not present

## 2021-08-31 DIAGNOSIS — R0981 Nasal congestion: Secondary | ICD-10-CM | POA: Diagnosis not present

## 2021-08-31 DIAGNOSIS — R059 Cough, unspecified: Secondary | ICD-10-CM | POA: Insufficient documentation

## 2021-08-31 DIAGNOSIS — I509 Heart failure, unspecified: Secondary | ICD-10-CM | POA: Insufficient documentation

## 2021-08-31 DIAGNOSIS — I110 Hypertensive heart disease with heart failure: Secondary | ICD-10-CM | POA: Insufficient documentation

## 2021-08-31 LAB — RESP PANEL BY RT-PCR (FLU A&B, COVID) ARPGX2
Influenza A by PCR: NEGATIVE
Influenza B by PCR: NEGATIVE
SARS Coronavirus 2 by RT PCR: NEGATIVE

## 2021-08-31 MED ORDER — HYDROCORTISONE (PERIANAL) 2.5 % EX CREA: 1.0000 "application " | TOPICAL_CREAM | Freq: Two times a day (BID) | CUTANEOUS | 0 refills | Status: DC

## 2021-08-31 MED ORDER — EPLERENONE 25 MG PO TABS
25.0000 mg | ORAL_TABLET | Freq: Every day | ORAL | 0 refills | Status: DC
Start: 2021-08-31 — End: 2021-09-05

## 2021-08-31 MED ORDER — FUROSEMIDE 40 MG PO TABS
60.0000 mg | ORAL_TABLET | Freq: Every day | ORAL | Status: DC
  Administered 2021-08-31: 60 mg via ORAL
  Filled 2021-08-31 (×2): qty 2

## 2021-08-31 MED ORDER — RIVAROXABAN 20 MG PO TABS: 20.0000 mg | ORAL_TABLET | Freq: Every day | ORAL | 0 refills | Status: DC

## 2021-08-31 MED ORDER — DOCUSATE SODIUM 100 MG PO CAPS: 100.0000 mg | ORAL_CAPSULE | Freq: Two times a day (BID) | ORAL | 0 refills | Status: DC

## 2021-08-31 MED ORDER — FUROSEMIDE 40 MG PO TABS: 60.0000 mg | ORAL_TABLET | Freq: Every day | ORAL | 0 refills | Status: DC

## 2021-08-31 MED ORDER — METOPROLOL SUCCINATE ER 50 MG PO TB24
50.0000 mg | ORAL_TABLET | Freq: Two times a day (BID) | ORAL | Status: DC
  Administered 2021-08-31: 50 mg via ORAL
  Filled 2021-08-31: qty 1

## 2021-08-31 MED ORDER — METOPROLOL SUCCINATE ER 50 MG PO TB24: 50.0000 mg | ORAL_TABLET | Freq: Two times a day (BID) | ORAL | 0 refills | Status: DC

## 2021-08-31 MED ORDER — SPIRONOLACTONE 25 MG PO TABS
25.0000 mg | ORAL_TABLET | Freq: Every day | ORAL | Status: DC
  Administered 2021-08-31: 25 mg via ORAL
  Filled 2021-08-31: qty 1

## 2021-08-31 MED ORDER — DOCUSATE SODIUM 100 MG PO CAPS
100.0000 mg | ORAL_CAPSULE | Freq: Two times a day (BID) | ORAL | Status: DC
  Administered 2021-08-31: 100 mg via ORAL
  Filled 2021-08-31: qty 1

## 2021-08-31 MED ORDER — POTASSIUM CHLORIDE 20 MEQ PO PACK
20.0000 meq | PACK | Freq: Every day | ORAL | Status: DC
  Administered 2021-08-31: 20 meq via ORAL
  Filled 2021-08-31: qty 1

## 2021-08-31 MED ORDER — BICTEGRAVIR-EMTRICITAB-TENOFOV 50-200-25 MG PO TABS
1.0000 | ORAL_TABLET | Freq: Every day | ORAL | Status: DC
  Administered 2021-08-31: 1 via ORAL
  Filled 2021-08-31: qty 1

## 2021-08-31 MED ORDER — RIVAROXABAN 20 MG PO TABS
20.0000 mg | ORAL_TABLET | Freq: Once | ORAL | Status: AC
  Administered 2021-08-31: 20 mg via ORAL
  Filled 2021-08-31: qty 1

## 2021-08-31 MED ORDER — BIKTARVY 50-200-25 MG PO TABS: 1.0000 | ORAL_TABLET | Freq: Every day | ORAL | 0 refills | Status: DC

## 2021-08-31 NOTE — ED Triage Notes (Signed)
Patient presents with complaints of shortness of breath. Patient states he has not had his medications in five days. Per notes made by Dr. Criss Alvine from 11/10, "In regards to his medications, he states he has all the prescriptions but just needs the doses today and he will get the refills as an outpatient." Patient also received doses of his medication while here on 11/10.

## 2021-08-31 NOTE — ED Triage Notes (Signed)
Pt BIB EMS. Pt states that he has not taken his medications all day. Pt reports with [facility].
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
## 2021-09-01 MED ORDER — METOPROLOL SUCCINATE ER 50 MG PO TB24
50.0000 mg | ORAL_TABLET | Freq: Two times a day (BID) | ORAL | Status: DC
  Administered 2021-09-01: 50 mg via ORAL
  Filled 2021-09-01: qty 1

## 2021-09-01 MED ORDER — RIVAROXABAN 20 MG PO TABS: 20.0000 mg | ORAL_TABLET | Freq: Every day | ORAL | Status: DC

## 2021-09-01 MED ORDER — BICTEGRAVIR-EMTRICITAB-TENOFOV 50-200-25 MG PO TABS
1.0000 | ORAL_TABLET | Freq: Every day | ORAL | Status: DC
  Administered 2021-09-01: 1 via ORAL
  Filled 2021-09-01: qty 1

## 2021-09-01 MED ORDER — RIVAROXABAN 20 MG PO TABS
20.0000 mg | ORAL_TABLET | Freq: Once | ORAL | Status: AC
  Administered 2021-09-01: 20 mg via ORAL
  Filled 2021-09-01: qty 1

## 2021-09-01 MED ORDER — FUROSEMIDE 40 MG PO TABS
60.0000 mg | ORAL_TABLET | Freq: Every day | ORAL | Status: DC
  Administered 2021-09-01: 60 mg via ORAL
  Filled 2021-09-01: qty 2
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
## 2021-09-01 NOTE — ED Notes (Signed)
Pt is in the lobby sleeping.

## 2021-09-01 NOTE — Discharge Instructions (Signed)
You came to the emergency department to be evaluated for your shortness of breath.  Your physical exam was reassuring.  You were given your home doses of medications.  Please use the prescriptions you received yesterday to fill your prescriptions.  Please follow-up with your infectious disease doctor at your reported visit tomorrow.  Get help right away if: Your shortness of breath gets worse. You have shortness of breath when you are resting. You feel light-headed or you faint. You have a cough that is not controlled with medicines. You cough up blood. You have pain with breathing. You have pain in your chest, arms, shoulders, or abdomen. You have a fever.

## 2021-09-02 ENCOUNTER — Emergency Department (HOSPITAL_COMMUNITY)
Admission: EM | Admit: 2021-09-02 | Discharge: 2021-09-03 | Disposition: A | Discharge status: Home / Self Care | Payer: Medicaid Other | Attending: Emergency Medicine | Admitting: Emergency Medicine

## 2021-09-02 DIAGNOSIS — R45851 Suicidal ideations: Secondary | ICD-10-CM | POA: Diagnosis not present

## 2021-09-02 DIAGNOSIS — Z79899 Other long term (current) drug therapy: Secondary | ICD-10-CM | POA: Insufficient documentation

## 2021-09-02 DIAGNOSIS — I110 Hypertensive heart disease with heart failure: Secondary | ICD-10-CM | POA: Insufficient documentation

## 2021-09-02 DIAGNOSIS — R197 Diarrhea, unspecified: Secondary | ICD-10-CM | POA: Insufficient documentation

## 2021-09-02 DIAGNOSIS — Z21 Asymptomatic human immunodeficiency virus [HIV] infection status: Secondary | ICD-10-CM | POA: Diagnosis not present

## 2021-09-02 DIAGNOSIS — I509 Heart failure, unspecified: Secondary | ICD-10-CM | POA: Diagnosis not present

## 2021-09-02 DIAGNOSIS — F17210 Nicotine dependence, cigarettes, uncomplicated: Secondary | ICD-10-CM | POA: Insufficient documentation

## 2021-09-02 DIAGNOSIS — R112 Nausea with vomiting, unspecified: Secondary | ICD-10-CM | POA: Insufficient documentation

## 2021-09-02 DIAGNOSIS — Z20822 Contact with and (suspected) exposure to covid-19: Secondary | ICD-10-CM | POA: Diagnosis not present

## 2021-09-02 DIAGNOSIS — R509 Fever, unspecified: Secondary | ICD-10-CM | POA: Diagnosis not present

## 2021-09-02 DIAGNOSIS — Z7901 Long term (current) use of anticoagulants: Secondary | ICD-10-CM | POA: Diagnosis not present

## 2021-09-02 DIAGNOSIS — R0602 Shortness of breath: Secondary | ICD-10-CM | POA: Insufficient documentation

## 2021-09-02 DIAGNOSIS — R441 Visual hallucinations: Secondary | ICD-10-CM | POA: Insufficient documentation

## 2021-09-02 DIAGNOSIS — R1010 Upper abdominal pain, unspecified: Secondary | ICD-10-CM | POA: Insufficient documentation

## 2021-09-03 ENCOUNTER — Emergency Department (HOSPITAL_COMMUNITY): Payer: Medicaid Other

## 2021-09-03 ENCOUNTER — Other Ambulatory Visit: Payer: Self-pay

## 2021-09-03 ENCOUNTER — Encounter (HOSPITAL_COMMUNITY): Payer: Self-pay

## 2021-09-03 ENCOUNTER — Telehealth (HOSPITAL_COMMUNITY): Payer: Self-pay | Admitting: Family Medicine

## 2021-09-03 LAB — CBC WITH DIFFERENTIAL/PLATELET
Abs Immature Granulocytes: 0.03 10*3/uL (ref 0.00–0.07)
Basophils Absolute: 0 10*3/uL (ref 0.0–0.1)
Basophils Relative: 0 %
Eosinophils Absolute: 0.1 10*3/uL (ref 0.0–0.5)
Eosinophils Relative: 2 %
HCT: 42.2 % (ref 39.0–52.0)
Hemoglobin: 14.2 g/dL (ref 13.0–17.0)
Immature Granulocytes: 0 %
Lymphocytes Relative: 27 %
Lymphs Abs: 1.9 10*3/uL (ref 0.7–4.0)
MCH: 32.6 pg (ref 26.0–34.0)
MCHC: 33.6 g/dL (ref 30.0–36.0)
MCV: 97 fL (ref 80.0–100.0)
Monocytes Absolute: 0.6 10*3/uL (ref 0.1–1.0)
Monocytes Relative: 9 %
Neutro Abs: 4.4 10*3/uL (ref 1.7–7.7)
Neutrophils Relative %: 62 %
Platelets: 293 10*3/uL (ref 150–400)
RBC: 4.35 MIL/uL (ref 4.22–5.81)
RDW: 13.9 % (ref 11.5–15.5)
WBC: 7.1 10*3/uL (ref 4.0–10.5)
nRBC: 0 % (ref 0.0–0.2)

## 2021-09-03 LAB — COMPREHENSIVE METABOLIC PANEL
ALT: 29 U/L (ref 0–44)
AST: 33 U/L (ref 15–41)
Albumin: 3.8 g/dL (ref 3.5–5.0)
Alkaline Phosphatase: 45 U/L (ref 38–126)
Anion gap: 11 (ref 5–15)
BUN: 19 mg/dL (ref 6–20)
CO2: 22 mmol/L (ref 22–32)
Calcium: 8.6 mg/dL — ABNORMAL LOW (ref 8.9–10.3)
Chloride: 106 mmol/L (ref 98–111)
Creatinine, Ser: 1.13 mg/dL (ref 0.61–1.24)
GFR, Estimated: >60 mL/min (ref >60)
Glucose, Bld: 94 mg/dL (ref 70–99)
Potassium: 3.8 mmol/L (ref 3.5–5.1)
Sodium: 139 mmol/L (ref 135–145)
Total Bilirubin: 0.5 mg/dL (ref 0.3–1.2)
Total Protein: 7.6 g/dL (ref 6.5–8.1)

## 2021-09-03 LAB — BRAIN NATRIURETIC PEPTIDE: B Natriuretic Peptide: 11.5 pg/mL (ref 0.0–100.0)

## 2021-09-03 LAB — RESP PANEL BY RT-PCR (FLU A&B, COVID) ARPGX2
Influenza A by PCR: NEGATIVE
Influenza B by PCR: NEGATIVE
SARS Coronavirus 2 by RT PCR: NEGATIVE

## 2021-09-03 LAB — LIPASE, BLOOD: Lipase: 30 U/L (ref 11–51)

## 2021-09-03 MED ORDER — ALUM & MAG HYDROXIDE-SIMETH 200-200-20 MG/5ML PO SUSP: 30.0000 mL | Freq: Four times a day (QID) | ORAL | Status: DC | PRN

## 2021-09-03 MED ORDER — METOPROLOL SUCCINATE ER 50 MG PO TB24
50.0000 mg | ORAL_TABLET | Freq: Two times a day (BID) | ORAL | Status: DC
  Administered 2021-09-03: 50 mg via ORAL
  Filled 2021-09-03: qty 1

## 2021-09-03 MED ORDER — BICTEGRAVIR-EMTRICITAB-TENOFOV 50-200-25 MG PO TABS
1.0000 | ORAL_TABLET | Freq: Every day | ORAL | Status: DC
  Filled 2021-09-03: qty 1

## 2021-09-03 MED ORDER — RIVAROXABAN 20 MG PO TABS: 20.0000 mg | ORAL_TABLET | Freq: Every day | ORAL | Status: DC

## 2021-09-03 MED ORDER — ONDANSETRON 8 MG PO TBDP
8.0000 mg | ORAL_TABLET | Freq: Once | ORAL | Status: AC
  Administered 2021-09-03: 8 mg via ORAL
  Filled 2021-09-03: qty 1

## 2021-09-03 MED ORDER — ACETAMINOPHEN 325 MG PO TABS: 650.0000 mg | ORAL_TABLET | ORAL | Status: DC | PRN

## 2021-09-03 MED ORDER — LOPERAMIDE HCL 2 MG PO CAPS
4.0000 mg | ORAL_CAPSULE | Freq: Once | ORAL | Status: AC
  Administered 2021-09-03: 4 mg via ORAL
  Filled 2021-09-03: qty 2

## 2021-09-03 MED ORDER — ONDANSETRON HCL 4 MG PO TABS: 4.0000 mg | ORAL_TABLET | Freq: Three times a day (TID) | ORAL | Status: DC | PRN

## 2021-09-03 NOTE — Consult Note (Signed)
[facility] Face-to-Face Psychiatry Consult   Reason for Consult:  SUicidal Referring Physician:  EDP Patient Identification: William Powers MRN:  [id number] Principal Diagnosis: <principal problem not specified> Diagnosis:  Active Problems:   * No active hospital problems. *   Total Time spent with patient: 30 minutes  Subjective:   William Powers is a 40 y.o. male patient admitted with diarrhea and suicidal thoughts.Patient appears to be hurried and rushed, and doesn't wish to participate in today's evaluation. He is very familiar to [place] on psychiatric services, as he is noted to have 10 ED visits this month of November. He reports that his main goal is to leave and discharge. " I said some stupid shit and Im ready to go. You dont have to ask me all those questions. Im not suicidal. Im not homicidal. Im not a danger to myself. Im ready to go. I have an appointment with a PCP at 12pm noon. Im not coming back to the hospital anymore for care. I need to make this doctors appointment. Can you please discharge me? How long will it take? " Patient continues to refute and deny suicidal ideations at this time, he is also able to contract for safety. He denies any Hi/AVH at this time. He is alert and oriented, restless yet cooperative, and pleasant. He is able to hold a linear conversation, and appears to be focused on discharging soon. He refuses to complete PSE, however denies suicidal thoughts, suicidal ideation, non suicidal self injurious behaviors.   HPI:  He has history of hypertension, heart failure with reduced ejection fraction, HIV infection, pulmonary embolism anticoagulated on rivaroxaban and comes in complaining of shortness of breath and diarrhea for greater than a month.  He states that he can have stools as often as every 30 minutes and he has been soiling his underwear.  There has been associated nausea and vomiting.  He has seen some blood in both stool and emesis.  He states he has had fever as high as  99.  He has had chills and sweats.  He is also complaining of some upper abdominal pain.  He complains of dyspnea during the same timeframe.  He states that he is suicidal because of this and he had thoughts of hanging himself.  He had been evaluated by TTS several times recently, but states that he is feeling more suicidal now.  He does endorse visual hallucinations of which are fleeting.  He denies ethanol or drug use.  He has tried to get into see his primary care provider, but states that he he cannot get a timely   Past Psychiatric History:Adjustment disorder, polysubstance abuse,   Risk to Self:  Denies Risk to Others:  Denies Prior Inpatient Therapy:   Denies Prior Outpatient Therapy:   Denies  Past Medical History:  Past Medical History:  Diagnosis Date   Heart failure (HCC)    HFrEF (heart failure with reduced ejection fraction) (HCC)    15% EF as of 2019   HIV (human immunodeficiency virus infection) (HCC)    Hypertension    Polysubstance abuse (HCC)    Pulmonary embolism (HCC)     Past Surgical History:  Procedure Laterality Date   HERNIA REPAIR     Family History:  Family History  Problem Relation Age of Onset   Lupus Mother    HIV Mother    HIV Father    Family Psychiatric  History: Denies Social History:  Social History   Substance and Sexual Activity  Alcohol Use Yes   Comment: None since 2019     Social History   Substance and Sexual Activity  Drug Use Not Currently   Types: Marijuana    Social History   Socioeconomic History   Marital status: Divorced    Spouse name: Not on file   Number of children: Not on file   Years of education: Not on file   Highest education level: Not on file  Occupational History   Not on file  Tobacco Use   Smoking status: Some Days    Packs/day: 2.00    Types: Cigarettes   Smokeless tobacco: Never  Vaping Use   Vaping Use: Never used  Substance and Sexual Activity   Alcohol use: Yes    Comment: None since  2019   Drug use: Not Currently    Types: Marijuana   Sexual activity: Not on file  Other Topics Concern   Not on file  Social History Narrative   Not on file   Social Determinants of Health   Financial Resource Strain: Not on file  Food Insecurity: Not on file  Transportation Needs: Not on file  Physical Activity: Not on file  Stress: Not on file  Social Connections: Not on file   Additional Social History:    Allergies:   Allergies  Allergen Reactions   Bee Venom Anaphylaxis   Lisinopril Swelling    Reported on 5/14 by him to his my chart account : was in ED for swollen lips     Sulfamethoxazole-Trimethoprim Rash and Swelling    Labs:  Results for orders placed or performed during the hospital encounter of 09/02/21 (from the past 48 hour(s))  Resp Panel by RT-PCR (Flu A&B, Covid) Nasopharyngeal Swab     Status: None   Collection Time: 09/03/21 12:32 AM   Specimen: Nasopharyngeal Swab; Nasopharyngeal(NP) swabs in vial transport medium  Result Value Ref Range   SARS Coronavirus 2 by RT PCR NEGATIVE NEGATIVE    Comment: (NOTE) SARS-CoV-2 target nucleic acids are NOT DETECTED.  The SARS-CoV-2 RNA is generally detectable in upper respiratory specimens during the acute phase of infection. The lowest concentration of SARS-CoV-2 viral copies this assay can detect is 138 copies/mL. A negative result does not preclude SARS-Cov-2 infection and should not be used as the sole basis for treatment or other patient management decisions. A negative result may occur with  improper specimen collection/handling, submission of specimen other than nasopharyngeal swab, presence of viral mutation(s) within the areas targeted by this assay, and inadequate number of viral copies(<138 copies/mL). A negative result must be combined with clinical observations, patient history, and epidemiological information. The expected result is Negative.  Fact Sheet for Patients:   BloggerCourse.com  Fact Sheet for Healthcare Providers:  SeriousBroker.it  This test is no t yet approved or cleared by the [place] FDA and  has been authorized for detection and/or diagnosis of SARS-CoV-2 by FDA under an Emergency Use Authorization (EUA). This EUA will remain  in effect (meaning this test can be used) for the duration of the COVID-19 declaration under Section 564(b)(1) of the Act, 21 U.S.C.section 360bbb-3(b)(1), unless the authorization is terminated  or revoked sooner.       Influenza A by PCR NEGATIVE NEGATIVE   Influenza B by PCR NEGATIVE NEGATIVE    Comment: (NOTE) The Xpert Xpress SARS-CoV-2/FLU/RSV plus assay is intended as an aid in the diagnosis of influenza from Nasopharyngeal swab specimens and should not be used as a sole basis  for treatment. Nasal washings and aspirates are unacceptable for Xpert Xpress SARS-CoV-2/FLU/RSV testing.  Fact Sheet for Patients: BloggerCourse.com  Fact Sheet for Healthcare Providers: SeriousBroker.it  This test is not yet approved or cleared by the [place] FDA and has been authorized for detection and/or diagnosis of SARS-CoV-2 by FDA under an Emergency Use Authorization (EUA). This EUA will remain in effect (meaning this test can be used) for the duration of the COVID-19 declaration under Section 564(b)(1) of the Act, 21 U.S.C. section 360bbb-3(b)(1), unless the authorization is terminated or revoked.  Performed at [facility], [address], [place], [place] [address]   Comprehensive metabolic panel     Status: Abnormal   Collection Time: 09/03/21  4:08 AM  Result Value Ref Range   Sodium 139 135 - 145 mmol/L   Potassium 3.8 3.5 - 5.1 mmol/L   Chloride 106 98 - 111 mmol/L   CO2 22 22 - 32 mmol/L   Glucose, Bld 94 70 - 99 mg/dL    Comment: Glucose reference range applies only to  samples taken after fasting for at least 8 hours.   BUN 19 6 - 20 mg/dL   Creatinine, Ser 5.80 0.61 - 1.24 mg/dL   Calcium 8.6 (L) 8.9 - 10.3 mg/dL   Total Protein 7.6 6.5 - 8.1 g/dL   Albumin 3.8 3.5 - 5.0 g/dL   AST 33 15 - 41 U/L   ALT 29 0 - 44 U/L   Alkaline Phosphatase 45 38 - 126 U/L   Total Bilirubin 0.5 0.3 - 1.2 mg/dL   GFR, Estimated >99 >83 mL/min    Comment: (NOTE) Calculated using the CKD-EPI Creatinine Equation (2021)    Anion gap 11 5 - 15    Comment: Performed at [facility], [address], [place], [place] [address]  CBC with Differential     Status: None   Collection Time: 09/03/21  4:08 AM  Result Value Ref Range   WBC 7.1 4.0 - 10.5 K/uL   RBC 4.35 4.22 - 5.81 MIL/uL   Hemoglobin 14.2 13.0 - 17.0 g/dL   HCT 53.9 76.7 - 34.1 %   MCV 97.0 80.0 - 100.0 fL   MCH 32.6 26.0 - 34.0 pg   MCHC 33.6 30.0 - 36.0 g/dL   RDW 93.7 90.2 - 40.9 %   Platelets 293 150 - 400 K/uL   nRBC 0.0 0.0 - 0.2 %   Neutrophils Relative % 62 %   Neutro Abs 4.4 1.7 - 7.7 K/uL   Lymphocytes Relative 27 %   Lymphs Abs 1.9 0.7 - 4.0 K/uL   Monocytes Relative 9 %   Monocytes Absolute 0.6 0.1 - 1.0 K/uL   Eosinophils Relative 2 %   Eosinophils Absolute 0.1 0.0 - 0.5 K/uL   Basophils Relative 0 %   Basophils Absolute 0.0 0.0 - 0.1 K/uL   Immature Granulocytes 0 %   Abs Immature Granulocytes 0.03 0.00 - 0.07 K/uL    Comment: Performed at [facility], [address] [place], [place] [address]  Brain natriuretic peptide     Status: None   Collection Time: 09/03/21  4:08 AM  Result Value Ref Range   B Natriuretic Peptide 11.5 0.0 - 100.0 pg/mL    Comment: Performed at [facility], [address] [place], [place] [address]  Lipase, blood     Status: None   Collection Time: 09/03/21  4:08 AM  Result Value Ref Range  Lipase 30 11 - 51 U/L    Comment: Performed at [facility], [address],  [place], [place] [address]    Current Facility-Administered Medications  Medication Dose Route Frequency Provider Last Rate Last Admin   acetaminophen (TYLENOL) tablet 650 mg  650 mg Oral Q4H PRN Dione Booze, MD       alum & mag hydroxide-simeth (MAALOX/MYLANTA) 200-200-20 MG/5ML suspension 30 mL  30 mL Oral Q6H PRN Dione Booze, MD       bictegravir-emtricitabine-tenofovir AF (BIKTARVY) 50-200-25 MG per tablet 1 tablet  1 tablet Oral Daily Dione Booze, MD       metoprolol succinate (TOPROL-XL) 24 hr tablet 50 mg  50 mg Oral BID Dione Booze, MD   50 mg at 09/03/21 0409   ondansetron [facility]) tablet 4 mg  4 mg Oral Q8H PRN Dione Booze, MD       rivaroxaban Carlena Hurl) tablet 20 mg  20 mg Oral Q supper Dione Booze, MD       Current Outpatient Medications  Medication Sig Dispense Refill   docusate sodium (COLACE) 100 MG capsule Take 1 capsule (100 mg total) by mouth every 12 (twelve) hours. (Patient taking differently: Take 100 mg by mouth daily as needed for mild constipation.) 60 capsule 0   metoprolol succinate (TOPROL-XL) 50 MG 24 hr tablet Take 1 tablet (50 mg total) by mouth 2 (two) times daily. 60 tablet 0   BIKTARVY 50-200-25 MG TABS tablet Take 1 tablet by mouth daily. (Patient not taking: Reported on 09/03/2021) 30 tablet 0   eplerenone (INSPRA) 25 MG tablet Take 1 tablet (25 mg total) by mouth daily. (Patient not taking: Reported on 09/03/2021) 30 tablet 0   furosemide (LASIX) 40 MG tablet Take 1.5 tablets (60 mg total) by mouth daily. (Patient not taking: Reported on 09/03/2021) 30 tablet 0   hydrocortisone (ANUSOL-HC) 2.5 % rectal cream Place 1 application rectally 2 (two) times daily. (Patient not taking: Reported on 09/03/2021) 30 g 0   Potassium Chloride ER 20 MEQ TBCR Take 20 mEq by mouth daily at 6 (six) AM. (Patient not taking: No sig reported) 30 tablet 0   rivaroxaban (XARELTO) 20 MG TABS tablet Take 1 tablet (20 mg total) by mouth daily with supper. (Patient not taking: Reported  on 09/03/2021) 30 tablet 0    Musculoskeletal: Strength & Muscle Tone: within normal limits Gait & Station: normal Patient leans: N/A            Psychiatric Specialty Exam:  Presentation  General Appearance: Appropriate for Environment  Eye Contact:Good  Speech:Clear and Coherent  Speech Volume:Normal  Handedness:Right   Mood and Affect  Mood:Depressed  Affect:Congruent   Thought Process  Thought Processes:Coherent  Descriptions of Associations:Intact  Orientation:Full (Time, Place and Person)  Thought Content:WDL  History of Schizophrenia/Schizoaffective disorder:No  Duration of Psychotic Symptoms:No data recorded Hallucinations:No data recorded Ideas of Reference:None  Suicidal Thoughts:No data recorded Homicidal Thoughts:No data recorded  Sensorium  Memory:Immediate Good; Recent Good; Remote Poor  Judgment:Fair  Insight:Good   Executive Functions  Concentration:Good  Attention Span:Good  Recall:Good  Fund of Knowledge:Good  Language:Good   Psychomotor Activity  Psychomotor Activity:No data recorded  Assets  Assets:Communication Skills; Desire for Improvement; Physical Health   Sleep  Sleep:No data recorded  Physical Exam: Physical Exam ROS Blood pressure 122/81, pulse 95, temperature 98.9 F (37.2 C), temperature source Oral, resp. rate 16, SpO2 100 %. There is no height or weight on file to calculate BMI.  Treatment  Plan Summary: Plan Psych cleared at this time. Patient has resources from previous ED visits earlier this week.   Disposition: No evidence of imminent risk to self or others at present.   Patient does not meet criteria for psychiatric inpatient admission. Supportive therapy provided about ongoing stressors. Refer to IOP. Discussed crisis plan, support from social network, calling 911, coming to the Emergency Department, and calling Suicide Hotline.  Maryagnes Amos, FNP 09/03/2021 10:27 AM
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
## 2021-09-03 NOTE — ED Notes (Signed)
Pt has 2 belonging bags. Belongings bags placed in recess Pt cabinet.

## 2021-09-03 NOTE — BH Assessment (Signed)
Care Management - Follow Up Discharges   Writer attempted to make contact with patient today and was unsuccessful.  Writer left a HIPPA compliant voice message.   Per chart review, patient provided with outpatient resources.

## 2021-09-03 NOTE — ED Triage Notes (Signed)
Pt reports having shortness of breath, diarrhea, and just not feeling well since today.

## 2021-09-03 NOTE — ED Notes (Signed)
Patient states he has thoughts of harming hisself "anyway I can." States he has had these thoughts "for a while". Denies HI thoughts. Also complains of chest pain 10/10.

## 2021-09-03 NOTE — ED Notes (Signed)
Patient refused meds and vitals. Requesting discharge. Provider at bedside.

## 2021-09-03 NOTE — ED Notes (Signed)
Pt resting comfortably in bed at this time

## 2021-09-04 ENCOUNTER — Emergency Department (HOSPITAL_COMMUNITY)
Admission: EM | Admit: 2021-09-04 | Discharge: 2021-09-05 | Disposition: A | Discharge status: Home / Self Care | Payer: Medicaid Other | Attending: Emergency Medicine | Admitting: Emergency Medicine

## 2021-09-04 ENCOUNTER — Encounter (HOSPITAL_COMMUNITY): Payer: Self-pay | Admitting: Emergency Medicine

## 2021-09-04 DIAGNOSIS — I5020 Unspecified systolic (congestive) heart failure: Secondary | ICD-10-CM | POA: Diagnosis not present

## 2021-09-04 DIAGNOSIS — R079 Chest pain, unspecified: Secondary | ICD-10-CM | POA: Insufficient documentation

## 2021-09-04 DIAGNOSIS — I110 Hypertensive heart disease with heart failure: Secondary | ICD-10-CM | POA: Diagnosis not present

## 2021-09-04 DIAGNOSIS — G8929 Other chronic pain: Secondary | ICD-10-CM | POA: Diagnosis not present

## 2021-09-04 DIAGNOSIS — Z765 Malingerer [conscious simulation]: Secondary | ICD-10-CM | POA: Diagnosis not present

## 2021-09-04 DIAGNOSIS — Z79899 Other long term (current) drug therapy: Secondary | ICD-10-CM | POA: Diagnosis not present

## 2021-09-04 DIAGNOSIS — R531 Weakness: Secondary | ICD-10-CM | POA: Insufficient documentation

## 2021-09-04 DIAGNOSIS — R0602 Shortness of breath: Secondary | ICD-10-CM | POA: Insufficient documentation

## 2021-09-04 DIAGNOSIS — Z21 Asymptomatic human immunodeficiency virus [HIV] infection status: Secondary | ICD-10-CM | POA: Insufficient documentation

## 2021-09-04 DIAGNOSIS — F17210 Nicotine dependence, cigarettes, uncomplicated: Secondary | ICD-10-CM | POA: Diagnosis not present

## 2021-09-04 DIAGNOSIS — Z7901 Long term (current) use of anticoagulants: Secondary | ICD-10-CM | POA: Insufficient documentation

## 2021-09-04 DIAGNOSIS — R109 Unspecified abdominal pain: Secondary | ICD-10-CM | POA: Insufficient documentation

## 2021-09-04 DIAGNOSIS — R112 Nausea with vomiting, unspecified: Secondary | ICD-10-CM | POA: Insufficient documentation

## 2021-09-04 DIAGNOSIS — I509 Heart failure, unspecified: Secondary | ICD-10-CM | POA: Insufficient documentation

## 2021-09-04 LAB — CBC
HCT: 42.6 % (ref 39.0–52.0)
Hemoglobin: 13.9 g/dL (ref 13.0–17.0)
MCH: 32.4 pg (ref 26.0–34.0)
MCHC: 32.6 g/dL (ref 30.0–36.0)
MCV: 99.3 fL (ref 80.0–100.0)
Platelets: 323 10*3/uL (ref 150–400)
RBC: 4.29 MIL/uL (ref 4.22–5.81)
RDW: 13.8 % (ref 11.5–15.5)
WBC: 8 10*3/uL (ref 4.0–10.5)
nRBC: 0 % (ref 0.0–0.2)

## 2021-09-04 LAB — COMPREHENSIVE METABOLIC PANEL
ALT: 27 U/L (ref 0–44)
AST: 32 U/L (ref 15–41)
Albumin: 4.1 g/dL (ref 3.5–5.0)
Alkaline Phosphatase: 43 U/L (ref 38–126)
Anion gap: 11 (ref 5–15)
BUN: 13 mg/dL (ref 6–20)
CO2: 22 mmol/L (ref 22–32)
Calcium: 8.4 mg/dL — ABNORMAL LOW (ref 8.9–10.3)
Chloride: 104 mmol/L (ref 98–111)
Creatinine, Ser: 1.46 mg/dL — ABNORMAL HIGH (ref 0.61–1.24)
GFR, Estimated: >60 mL/min (ref >60)
Glucose, Bld: 156 mg/dL — ABNORMAL HIGH (ref 70–99)
Potassium: 3.2 mmol/L — ABNORMAL LOW (ref 3.5–5.1)
Sodium: 137 mmol/L (ref 135–145)
Total Bilirubin: 0.6 mg/dL (ref 0.3–1.2)
Total Protein: 8.2 g/dL — ABNORMAL HIGH (ref 6.5–8.1)

## 2021-09-04 LAB — LIPASE, BLOOD: Lipase: 32 U/L (ref 11–51)

## 2021-09-04 NOTE — ED Triage Notes (Signed)
Patient brought in by [facility]. Patient is complaining of nausea and vomiting for 30 min ago. Patient is complaining of being out of his medications for a week.

## 2021-09-05 ENCOUNTER — Other Ambulatory Visit: Payer: Self-pay

## 2021-09-05 ENCOUNTER — Encounter (HOSPITAL_COMMUNITY): Payer: Self-pay | Admitting: Emergency Medicine

## 2021-09-05 ENCOUNTER — Emergency Department (HOSPITAL_COMMUNITY)
Admission: EM | Admit: 2021-09-05 | Discharge: 2021-09-05 | Disposition: A | Discharge status: Home / Self Care | Payer: Medicaid Other | Source: Home / Self Care | Attending: Emergency Medicine | Admitting: Emergency Medicine

## 2021-09-05 ENCOUNTER — Encounter: Payer: Self-pay | Admitting: *Deleted

## 2021-09-05 ENCOUNTER — Other Ambulatory Visit (HOSPITAL_COMMUNITY): Payer: Self-pay

## 2021-09-05 ENCOUNTER — Emergency Department (HOSPITAL_COMMUNITY): Payer: Medicaid Other

## 2021-09-05 DIAGNOSIS — Z79899 Other long term (current) drug therapy: Secondary | ICD-10-CM | POA: Insufficient documentation

## 2021-09-05 DIAGNOSIS — Z765 Malingerer [conscious simulation]: Secondary | ICD-10-CM

## 2021-09-05 DIAGNOSIS — I5020 Unspecified systolic (congestive) heart failure: Secondary | ICD-10-CM | POA: Insufficient documentation

## 2021-09-05 DIAGNOSIS — I110 Hypertensive heart disease with heart failure: Secondary | ICD-10-CM | POA: Insufficient documentation

## 2021-09-05 DIAGNOSIS — R079 Chest pain, unspecified: Secondary | ICD-10-CM | POA: Insufficient documentation

## 2021-09-05 DIAGNOSIS — Z7901 Long term (current) use of anticoagulants: Secondary | ICD-10-CM | POA: Insufficient documentation

## 2021-09-05 DIAGNOSIS — F17210 Nicotine dependence, cigarettes, uncomplicated: Secondary | ICD-10-CM | POA: Insufficient documentation

## 2021-09-05 DIAGNOSIS — R109 Unspecified abdominal pain: Secondary | ICD-10-CM | POA: Insufficient documentation

## 2021-09-05 DIAGNOSIS — G8929 Other chronic pain: Secondary | ICD-10-CM | POA: Insufficient documentation

## 2021-09-05 DIAGNOSIS — Z21 Asymptomatic human immunodeficiency virus [HIV] infection status: Secondary | ICD-10-CM | POA: Insufficient documentation

## 2021-09-05 LAB — URINALYSIS, ROUTINE W REFLEX MICROSCOPIC
Bilirubin Urine: NEGATIVE
Glucose, UA: NEGATIVE mg/dL
Hgb urine dipstick: NEGATIVE
Ketones, ur: NEGATIVE mg/dL
Leukocytes,Ua: NEGATIVE
Nitrite: NEGATIVE
Protein, ur: NEGATIVE mg/dL
Specific Gravity, Urine: 1.017 (ref 1.005–1.030)
pH: 5 (ref 5.0–8.0)

## 2021-09-05 LAB — CBC WITH DIFFERENTIAL/PLATELET
Abs Immature Granulocytes: 0.04 10*3/uL (ref 0.00–0.07)
Basophils Absolute: 0 10*3/uL (ref 0.0–0.1)
Basophils Relative: 0 %
Eosinophils Absolute: 0 10*3/uL (ref 0.0–0.5)
Eosinophils Relative: 0 %
HCT: 43.6 % (ref 39.0–52.0)
Hemoglobin: 14.4 g/dL (ref 13.0–17.0)
Immature Granulocytes: 1 %
Lymphocytes Relative: 18 %
Lymphs Abs: 1.5 10*3/uL (ref 0.7–4.0)
MCH: 32.6 pg (ref 26.0–34.0)
MCHC: 33 g/dL (ref 30.0–36.0)
MCV: 98.6 fL (ref 80.0–100.0)
Monocytes Absolute: 0.5 10*3/uL (ref 0.1–1.0)
Monocytes Relative: 6 %
Neutro Abs: 6 10*3/uL (ref 1.7–7.7)
Neutrophils Relative %: 75 %
Platelets: 319 10*3/uL (ref 150–400)
RBC: 4.42 MIL/uL (ref 4.22–5.81)
RDW: 13.8 % (ref 11.5–15.5)
WBC: 8 10*3/uL (ref 4.0–10.5)
nRBC: 0 % (ref 0.0–0.2)

## 2021-09-05 LAB — COMPREHENSIVE METABOLIC PANEL
ALT: 28 U/L (ref 0–44)
AST: 29 U/L (ref 15–41)
Albumin: 3.7 g/dL (ref 3.5–5.0)
Alkaline Phosphatase: 48 U/L (ref 38–126)
Anion gap: 12 (ref 5–15)
BUN: 16 mg/dL (ref 6–20)
CO2: 22 mmol/L (ref 22–32)
Calcium: 9.3 mg/dL (ref 8.9–10.3)
Chloride: 104 mmol/L (ref 98–111)
Creatinine, Ser: 1.29 mg/dL — ABNORMAL HIGH (ref 0.61–1.24)
GFR, Estimated: >60 mL/min (ref >60)
Glucose, Bld: 109 mg/dL — ABNORMAL HIGH (ref 70–99)
Potassium: 4.5 mmol/L (ref 3.5–5.1)
Sodium: 138 mmol/L (ref 135–145)
Total Bilirubin: 0.4 mg/dL (ref 0.3–1.2)
Total Protein: 7.8 g/dL (ref 6.5–8.1)

## 2021-09-05 LAB — TROPONIN I (HIGH SENSITIVITY)
Troponin I (High Sensitivity): 4 ng/L (ref <18)
Troponin I (High Sensitivity): 4 ng/L (ref <18)

## 2021-09-05 LAB — BRAIN NATRIURETIC PEPTIDE: B Natriuretic Peptide: 6.2 pg/mL (ref 0.0–100.0)

## 2021-09-05 LAB — LIPASE, BLOOD: Lipase: 31 U/L (ref 11–51)

## 2021-09-05 MED ORDER — POTASSIUM CHLORIDE CRYS ER 20 MEQ PO TBCR
20.0000 meq | EXTENDED_RELEASE_TABLET | Freq: Every day | ORAL | 0 refills | Status: DC
  Filled 2021-09-05: qty 30, 30d supply, fill #0

## 2021-09-05 MED ORDER — BIKTARVY 50-200-25 MG PO TABS
1.0000 | ORAL_TABLET | Freq: Every day | ORAL | 0 refills | Status: DC
  Filled 2021-09-05 (×2): qty 30, 30d supply, fill #0

## 2021-09-05 MED ORDER — EPLERENONE 25 MG PO TABS
25.0000 mg | ORAL_TABLET | Freq: Every day | ORAL | 0 refills | Status: DC
  Filled 2021-09-05: qty 30, 30d supply, fill #0

## 2021-09-05 MED ORDER — FUROSEMIDE 40 MG PO TABS
60.0000 mg | ORAL_TABLET | Freq: Every day | ORAL | 0 refills | Status: DC
  Filled 2021-09-05: qty 45, 30d supply, fill #0

## 2021-09-05 MED ORDER — METOPROLOL SUCCINATE ER 50 MG PO TB24
50.0000 mg | ORAL_TABLET | Freq: Two times a day (BID) | ORAL | 0 refills | Status: DC
  Filled 2021-09-05: qty 60, 30d supply, fill #0

## 2021-09-05 MED ORDER — RIVAROXABAN 20 MG PO TABS
20.0000 mg | ORAL_TABLET | Freq: Every day | ORAL | 0 refills | Status: DC
  Filled 2021-09-05: qty 30, 30d supply, fill #0
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
## 2021-09-05 NOTE — ED Notes (Signed)
Patient refusing to leave. Called security to have patient escorted out.

## 2021-09-05 NOTE — ED Notes (Signed)
Gave pt [place] sandwich and diet spite. Pt will wait in waiting room to speak with social worker

## 2021-09-05 NOTE — Progress Notes (Signed)
RNCM consulted regarding medication assistance of pt unable to afford medications.  RNCM met with pt in ER lobby to discuss importance of securing Rx.  RNCM utilized [facility] petty cash to cover $20 co-pay and filled through [facility].  Rx e-scribed to [facility] and delivered to pt prior to discharge home.  No further RNCM needs identified at this time.

## 2021-09-05 NOTE — Discharge Instructions (Signed)
Follow-up with your primary doctor to discuss your medications and referrals to gastroenterology.

## 2021-09-05 NOTE — ED Provider Notes (Signed)
Emergency Medicine Provider Triage Evaluation Note  William Powers , a 40 y.o. male  was evaluated in triage.  Pt complains of abdominal pain.  States has been ongoing for a while now.  Reports emesis x7 today.  Also reports some pain in his chest and feels like his breathing is off.  He does have known heart disease and CHF.  Denies fever/chills, sick contacts.  Review of Systems  Positive: Abdominal pain, chest pain, SOB Negative: fever  Physical Exam  BP 130/89 (BP Location: Right Arm)   Pulse (!) 103   Temp 98 F (36.7 C)   Resp 17   Ht 6\' 4"  (1.93 m)   Wt 117 kg   SpO2 100%   BMI 31.40 kg/m   Gen:   Awake, no distress   Resp:  Normal effort  MSK:   Moves extremities without difficulty  Other:    Medical Decision Making  Medically screening exam initiated at 5:07 AM.  Appropriate orders placed.  William Powers was informed that the remainder of the evaluation will be completed by another provider, this initial triage assessment does not replace that evaluation, and the importance of remaining in the ED until their evaluation is complete.  Abdominal pain, chest pain, SOB.  Frequent ED visits recently for similar complaints.  EKG, labs, CXR.   [name], PA-C 09/05/21 0510    09/07/21, MD 09/05/21 [phone]

## 2021-09-05 NOTE — ED Provider Notes (Signed)
[facility]ERG[facility] DEPARTMENT Provider Note  CSN: [id number] Arrival date & time: 09/05/21 0502    History Chief Complaint  Patient presents with   Abdominal Pain    William Powers is a 40 y.o. male here for evaluation of his chronic abdominal pain. He has had numerous ED visits in recent weeks for similar or for psychiatric complaints. He has had extensive negative work ups including multiple CT scans. He states he has not been able to get his medications because he is 'fighting with the insurance company'. Also complaining of vague chest pains and trouble breathing. No new or acute complaints today.    Past Medical History:  Diagnosis Date   Heart failure (HCC)    HFrEF (heart failure with reduced ejection fraction) (HCC)    15% EF as of 2019   HIV (human immunodeficiency virus infection) (HCC)    Hypertension    Polysubstance abuse (HCC)    Pulmonary embolism (HCC)     Past Surgical History:  Procedure Laterality Date   HERNIA REPAIR      Family History  Problem Relation Age of Onset   Lupus Mother    HIV Mother    HIV Father     Social History   Tobacco Use   Smoking status: Some Days    Packs/day: 2.00    Types: Cigarettes   Smokeless tobacco: Never  Vaping Use   Vaping Use: Never used  Substance Use Topics   Alcohol use: Yes    Comment: None since 2019   Drug use: Not Currently    Types: Marijuana     Home Medications Prior to Admission medications   Medication Sig Start Date End Date Taking? Authorizing Provider  BIKTARVY 50-200-25 MG TABS tablet Take 1 tablet by mouth daily. Patient not taking: Reported on 09/03/2021 08/31/21   Gwyneth Sprout, MD  docusate sodium (COLACE) 100 MG capsule Take 1 capsule (100 mg total) by mouth every 12 (twelve) hours. Patient taking differently: Take 100 mg by mouth daily as needed for mild constipation. 08/31/21   Gwyneth Sprout, MD  eplerenone (INSPRA) 25 MG tablet Take 1 tablet (25 mg total) by mouth  daily. Patient not taking: Reported on 09/03/2021 08/31/21   Gwyneth Sprout, MD  furosemide (LASIX) 40 MG tablet Take 1.5 tablets (60 mg total) by mouth daily. Patient not taking: Reported on 09/03/2021 08/31/21 10/15/23  Gwyneth Sprout, MD  hydrocortisone (ANUSOL-HC) 2.5 % rectal cream Place 1 application rectally 2 (two) times daily. Patient not taking: Reported on 09/03/2021 08/31/21   Gwyneth Sprout, MD  metoprolol succinate (TOPROL-XL) 50 MG 24 hr tablet Take 1 tablet (50 mg total) by mouth 2 (two) times daily. 08/31/21   Gwyneth Sprout, MD  Potassium Chloride ER 20 MEQ TBCR Take 20 mEq by mouth daily at 6 (six) AM. Patient not taking: No sig reported 08/07/21   Bobette Mo, MD  rivaroxaban (XARELTO) 20 MG TABS tablet Take 1 tablet (20 mg total) by mouth daily with supper. Patient not taking: Reported on 09/03/2021 08/31/21   Gwyneth Sprout, MD     Allergies    Bee venom, Lisinopril, and Sulfamethoxazole-trimethoprim   Review of Systems   Review of Systems A comprehensive review of systems was completed and negative except as noted in HPI.    Physical Exam BP (!) 145/82 (BP Location: Right Arm)   Pulse 93   Temp 98 F (36.7 C) (Oral)   Resp 16   Ht 6\' 4"  (1.93 m)  Wt 117 kg   SpO2 100%   BMI 31.40 kg/m   Physical Exam Vitals and nursing note reviewed.  Constitutional:      Appearance: Normal appearance.  HENT:     Head: Normocephalic and atraumatic.     Nose: Nose normal.     Mouth/Throat:     Mouth: Mucous membranes are moist.  Eyes:     Extraocular Movements: Extraocular movements intact.     Conjunctiva/sclera: Conjunctivae normal.  Cardiovascular:     Rate and Rhythm: Normal rate.  Pulmonary:     Effort: Pulmonary effort is normal.     Breath sounds: Normal breath sounds.  Abdominal:     General: Abdomen is flat.     Palpations: Abdomen is soft.     Tenderness: There is no abdominal tenderness.  Musculoskeletal:        General:  No swelling. Normal range of motion.     Cervical back: Neck supple.  Skin:    General: Skin is warm and dry.  Neurological:     General: No focal deficit present.     Mental Status: He is alert.  Psychiatric:        Mood and Affect: Mood normal.     ED Results / Procedures / Treatments   Labs (all labs ordered are listed, but only abnormal results are displayed) Labs Reviewed  COMPREHENSIVE METABOLIC PANEL - Abnormal; Notable for the following components:      Result Value   Glucose, Bld 109 (*)    Creatinine, Ser 1.29 (*)    All other components within normal limits  CBC WITH DIFFERENTIAL/PLATELET  LIPASE, BLOOD  URINALYSIS, ROUTINE W REFLEX MICROSCOPIC  BRAIN NATRIURETIC PEPTIDE  TROPONIN I (HIGH SENSITIVITY)  TROPONIN I (HIGH SENSITIVITY)    EKG EKG Interpretation  Date/Time:  Thursday September 05 2021 05:18:48 EST Ventricular Rate:  99 PR Interval:  156 QRS Duration: 80 QT Interval:  338 QTC Calculation: 433 R Axis:   65 Text Interpretation: Normal sinus rhythm Abnormal QRS-T angle, consider primary T wave abnormality Abnormal ECG No significant change since last tracing Confirmed by Susy Frizzle [phone]) on 09/05/2021 8:14:30 AM  Radiology DG Chest 2 View  Result Date: 09/05/2021 CLINICAL DATA:  Chest pain and abdominal pain. EXAM: CHEST - 2 VIEW COMPARISON:  Portable chest 09/03/2021 FINDINGS: The heart size and mediastinal contours are within normal limits. Both lungs are clear. The visualized skeletal structures are unremarkable. IMPRESSION: No active cardiopulmonary disease or interval changes. Electronically Signed   By: Almira Bar M.D.   On: 09/05/2021 05:45    Procedures Procedures  Medications Ordered in the ED Medications - No data to display   MDM Rules/Calculators/A&P MDM Patient here for chronic abdominal pain. Labs and imaging done in triage are normal and unchanged from multiple recent ED visits. No indication for further ED workup or  admission to the hospital. He is asking to speak to a Child psychotherapist but will not tell me what he needs from them. Our TOC has tried to call him after discharge recently but he has not answered the phone. He was advised that the Social Workers can call him later today to discuss his issues but that without a specific emergent need, he does not need to stay in the ED for that. Otherwise stable for discharge.   ED Course  I have reviewed the triage vital signs and the nursing notes.  Pertinent labs & imaging results that were available during my care of the  patient were reviewed by me and considered in my medical decision making (see chart for details).     Final Clinical Impression(s) / ED Diagnoses Final diagnoses:  Malingering    Rx / DC Orders ED Discharge Orders     None        Pollyann Savoy, MD 09/05/21 [phone]

## 2021-09-05 NOTE — ED Triage Notes (Signed)
Pt c/o upper abdominal pain "for a few days". States he has had multiple episodes of emesis PTA. Pt c/o palpitations when laying on R side.

## 2021-09-05 NOTE — ED Provider Notes (Signed)
8:59 AM asked by [facility] case manager to send patient's prescriptions [facility].  I have reviewed medication list and prescribed these per their request.     Renne Crigler, PA-C 09/05/21 0859    Glendora Score, MD 09/05/21 1553

## 2021-09-08 ENCOUNTER — Other Ambulatory Visit: Payer: Self-pay

## 2021-09-08 ENCOUNTER — Emergency Department (HOSPITAL_COMMUNITY)
Admission: EM | Admit: 2021-09-08 | Discharge: 2021-09-09 | Disposition: A | Discharge status: Home / Self Care | Payer: Medicaid Other | Attending: Emergency Medicine | Admitting: Emergency Medicine

## 2021-09-08 ENCOUNTER — Encounter (HOSPITAL_COMMUNITY): Payer: Self-pay | Admitting: Emergency Medicine

## 2021-09-08 DIAGNOSIS — Z20822 Contact with and (suspected) exposure to covid-19: Secondary | ICD-10-CM | POA: Diagnosis not present

## 2021-09-08 DIAGNOSIS — Z79899 Other long term (current) drug therapy: Secondary | ICD-10-CM | POA: Diagnosis not present

## 2021-09-08 DIAGNOSIS — N289 Disorder of kidney and ureter, unspecified: Secondary | ICD-10-CM

## 2021-09-08 DIAGNOSIS — R45851 Suicidal ideations: Secondary | ICD-10-CM | POA: Insufficient documentation

## 2021-09-08 DIAGNOSIS — I509 Heart failure, unspecified: Secondary | ICD-10-CM | POA: Insufficient documentation

## 2021-09-08 DIAGNOSIS — Z046 Encounter for general psychiatric examination, requested by authority: Secondary | ICD-10-CM | POA: Insufficient documentation

## 2021-09-08 DIAGNOSIS — K6289 Other specified diseases of anus and rectum: Secondary | ICD-10-CM | POA: Insufficient documentation

## 2021-09-08 DIAGNOSIS — Z765 Malingerer [conscious simulation]: Secondary | ICD-10-CM | POA: Diagnosis not present

## 2021-09-08 DIAGNOSIS — Z7901 Long term (current) use of anticoagulants: Secondary | ICD-10-CM | POA: Insufficient documentation

## 2021-09-08 DIAGNOSIS — N189 Chronic kidney disease, unspecified: Secondary | ICD-10-CM | POA: Insufficient documentation

## 2021-09-08 DIAGNOSIS — K625 Hemorrhage of anus and rectum: Secondary | ICD-10-CM | POA: Insufficient documentation

## 2021-09-08 DIAGNOSIS — I110 Hypertensive heart disease with heart failure: Secondary | ICD-10-CM | POA: Diagnosis not present

## 2021-09-08 DIAGNOSIS — F17210 Nicotine dependence, cigarettes, uncomplicated: Secondary | ICD-10-CM | POA: Diagnosis not present

## 2021-09-08 DIAGNOSIS — Z21 Asymptomatic human immunodeficiency virus [HIV] infection status: Secondary | ICD-10-CM | POA: Diagnosis not present

## 2021-09-08 MED ORDER — ACETAMINOPHEN 325 MG PO TABS
650.0000 mg | ORAL_TABLET | Freq: Once | ORAL | Status: AC
  Administered 2021-09-09: 650 mg via ORAL
  Filled 2021-09-08: qty 2

## 2021-09-08 NOTE — ED Triage Notes (Signed)
Patient reports bloody stools for 3 weeks , he takes Xarelto , patient added rectal pain .
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
## 2021-09-09 ENCOUNTER — Emergency Department (EMERGENCY_DEPARTMENT_HOSPITAL)
Admission: EM | Admit: 2021-09-09 | Discharge: 2021-09-09 | Disposition: A | Discharge status: Home / Self Care | Payer: Medicaid Other | Source: Home / Self Care | Attending: Emergency Medicine | Admitting: Emergency Medicine

## 2021-09-09 ENCOUNTER — Encounter (HOSPITAL_COMMUNITY): Payer: Self-pay

## 2021-09-09 ENCOUNTER — Other Ambulatory Visit: Payer: Self-pay

## 2021-09-09 DIAGNOSIS — F4321 Adjustment disorder with depressed mood: Secondary | ICD-10-CM

## 2021-09-09 DIAGNOSIS — R45851 Suicidal ideations: Secondary | ICD-10-CM | POA: Diagnosis not present

## 2021-09-09 DIAGNOSIS — Z20822 Contact with and (suspected) exposure to covid-19: Secondary | ICD-10-CM | POA: Insufficient documentation

## 2021-09-09 DIAGNOSIS — K6289 Other specified diseases of anus and rectum: Secondary | ICD-10-CM | POA: Insufficient documentation

## 2021-09-09 DIAGNOSIS — Z046 Encounter for general psychiatric examination, requested by authority: Secondary | ICD-10-CM | POA: Insufficient documentation

## 2021-09-09 DIAGNOSIS — Z21 Asymptomatic human immunodeficiency virus [HIV] infection status: Secondary | ICD-10-CM | POA: Insufficient documentation

## 2021-09-09 DIAGNOSIS — I509 Heart failure, unspecified: Secondary | ICD-10-CM | POA: Insufficient documentation

## 2021-09-09 DIAGNOSIS — I110 Hypertensive heart disease with heart failure: Secondary | ICD-10-CM | POA: Insufficient documentation

## 2021-09-09 DIAGNOSIS — Z79899 Other long term (current) drug therapy: Secondary | ICD-10-CM | POA: Insufficient documentation

## 2021-09-09 DIAGNOSIS — F17210 Nicotine dependence, cigarettes, uncomplicated: Secondary | ICD-10-CM | POA: Insufficient documentation

## 2021-09-09 DIAGNOSIS — Z765 Malingerer [conscious simulation]: Secondary | ICD-10-CM | POA: Diagnosis not present

## 2021-09-09 LAB — BASIC METABOLIC PANEL
Anion gap: 9 (ref 5–15)
BUN: 13 mg/dL (ref 6–20)
CO2: 24 mmol/L (ref 22–32)
Calcium: 8.7 mg/dL — ABNORMAL LOW (ref 8.9–10.3)
Chloride: 104 mmol/L (ref 98–111)
Creatinine, Ser: 1.33 mg/dL — ABNORMAL HIGH (ref 0.61–1.24)
GFR, Estimated: >60 mL/min (ref >60)
Glucose, Bld: 94 mg/dL (ref 70–99)
Potassium: 3.6 mmol/L (ref 3.5–5.1)
Sodium: 137 mmol/L (ref 135–145)

## 2021-09-09 LAB — COMPREHENSIVE METABOLIC PANEL
ALT: 31 U/L (ref 0–44)
AST: 33 U/L (ref 15–41)
Albumin: 4 g/dL (ref 3.5–5.0)
Alkaline Phosphatase: 45 U/L (ref 38–126)
Anion gap: 9 (ref 5–15)
BUN: 19 mg/dL (ref 6–20)
CO2: 23 mmol/L (ref 22–32)
Calcium: 9.1 mg/dL (ref 8.9–10.3)
Chloride: 105 mmol/L (ref 98–111)
Creatinine, Ser: 1.06 mg/dL (ref 0.61–1.24)
GFR, Estimated: >60 mL/min (ref >60)
Glucose, Bld: 92 mg/dL (ref 70–99)
Potassium: 4.2 mmol/L (ref 3.5–5.1)
Sodium: 137 mmol/L (ref 135–145)
Total Bilirubin: 0.4 mg/dL (ref 0.3–1.2)
Total Protein: 7.8 g/dL (ref 6.5–8.1)

## 2021-09-09 LAB — CBC WITH DIFFERENTIAL/PLATELET
Abs Immature Granulocytes: 0.02 10*3/uL (ref 0.00–0.07)
Abs Immature Granulocytes: 0.03 10*3/uL (ref 0.00–0.07)
Basophils Absolute: 0.1 10*3/uL (ref 0.0–0.1)
Basophils Absolute: 0.1 10*3/uL (ref 0.0–0.1)
Basophils Relative: 1 %
Basophils Relative: 1 %
Eosinophils Absolute: 0.1 10*3/uL (ref 0.0–0.5)
Eosinophils Absolute: 0.1 10*3/uL (ref 0.0–0.5)
Eosinophils Relative: 2 %
Eosinophils Relative: 2 %
HCT: 39.9 % (ref 39.0–52.0)
HCT: 44.1 % (ref 39.0–52.0)
Hemoglobin: 13.6 g/dL (ref 13.0–17.0)
Hemoglobin: 14.4 g/dL (ref 13.0–17.0)
Immature Granulocytes: 0 %
Immature Granulocytes: 0 %
Lymphocytes Relative: 37 %
Lymphocytes Relative: 21 %
Lymphs Abs: 2.5 10*3/uL (ref 0.7–4.0)
Lymphs Abs: 1.6 10*3/uL (ref 0.7–4.0)
MCH: 33 pg (ref 26.0–34.0)
MCH: 32.2 pg (ref 26.0–34.0)
MCHC: 34.1 g/dL (ref 30.0–36.0)
MCHC: 32.7 g/dL (ref 30.0–36.0)
MCV: 96.8 fL (ref 80.0–100.0)
MCV: 98.7 fL (ref 80.0–100.0)
Monocytes Absolute: 0.6 10*3/uL (ref 0.1–1.0)
Monocytes Absolute: 0.6 10*3/uL (ref 0.1–1.0)
Monocytes Relative: 8 %
Monocytes Relative: 7 %
Neutro Abs: 3.6 10*3/uL (ref 1.7–7.7)
Neutro Abs: 5.2 10*3/uL (ref 1.7–7.7)
Neutrophils Relative %: 52 %
Neutrophils Relative %: 69 %
Platelets: 310 10*3/uL (ref 150–400)
Platelets: 315 10*3/uL (ref 150–400)
RBC: 4.12 MIL/uL — ABNORMAL LOW (ref 4.22–5.81)
RBC: 4.47 MIL/uL (ref 4.22–5.81)
RDW: 13.7 % (ref 11.5–15.5)
RDW: 14 % (ref 11.5–15.5)
WBC: 6.9 10*3/uL (ref 4.0–10.5)
WBC: 7.5 10*3/uL (ref 4.0–10.5)
nRBC: 0 % (ref 0.0–0.2)
nRBC: 0 % (ref 0.0–0.2)

## 2021-09-09 LAB — RESP PANEL BY RT-PCR (FLU A&B, COVID) ARPGX2
Influenza A by PCR: NEGATIVE
Influenza B by PCR: NEGATIVE
SARS Coronavirus 2 by RT PCR: NEGATIVE

## 2021-09-09 LAB — ETHANOL: Alcohol, Ethyl (B): <10 mg/dL (ref <10)

## 2021-09-09 LAB — PROTIME-INR
INR: 1 (ref 0.8–1.2)
Prothrombin Time: 12.8 seconds (ref 11.4–15.2)

## 2021-09-09 LAB — RAPID URINE DRUG SCREEN, HOSP PERFORMED
Amphetamines: NOT DETECTED
Barbiturates: NOT DETECTED
Benzodiazepines: NOT DETECTED
Cocaine: NOT DETECTED
Opiates: NOT DETECTED
Tetrahydrocannabinol: POSITIVE — AB

## 2021-09-09 LAB — POC OCCULT BLOOD, ED: Fecal Occult Bld: POSITIVE — AB

## 2021-09-09 MED ORDER — PROCTOFOAM HC 1-1 % EX FOAM: 1.0000 | Freq: Two times a day (BID) | CUTANEOUS | 0 refills | Status: DC
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
## 2021-09-09 NOTE — ED Triage Notes (Signed)
Pt states that he has rectal pain and that he is suicidal. Pt states that he has had suicidal thoughts for a while now and this morning he was going to hang himself. Pt states that before this morning he has never harmed himself before.

## 2021-09-09 NOTE — ED Notes (Signed)
Pt belonging have been placed behind triage area. (Two bags )

## 2021-09-09 NOTE — Consult Note (Signed)
   Patient was seen and assessed by Dr.Horton, moments after he refused to discharge this morning and began endorsing suicidal ideations. He has become very familiar to [place] at [facility], as he historically endorses suicidal ideations upon disposition. Suspect he is malingering however his component of secondary gain is not yet clear with the exception of housing and food during cold days.    Patient is seen and assessed by this nurse practitioner, in which he vaguely denies having any current suicidal ideations.  When asked the patient when his reason for coming to the hospital he states" I got a problem that no once can fix around here. I am going to go see my primary or an urgent care. I dont know why I say those things about being suicidal. I am really not suicidal. "     He is observed to be smiling and having a grin on his face, as he is observed to be eating his breakfast.  When asking for any other questions or concerns, patient states" I just need to go, when do you think you will get me out of here. " Patient continues to refute any current or active suicidal ideations at this time.  He further denies any homicidal ideations, and or auditory or visual hallucinations.  Patient appears to be future oriented and motivated to seek outpatient treatment.  Will psychiatrically clear at this time.    -Patient is psych cleared.

## 2021-09-09 NOTE — Discharge Instructions (Signed)
Please follow up with the gastroenterologist for further evaluation and treatment of your rectal pain and bleeding.  If the bleeding is getting worse, you may come back to the emergency department for further evaluation.  Please work with your mental health professionals regarding your suicidal thoughts.  If you feel you need to be seen regarding this, please go to the behavioral health urgent care center.

## 2021-09-09 NOTE — ED Notes (Signed)
Pt expressing SI ideation, MD aware. RN instructed to continue with d/c. MD spoke with pt, advising pt utilize behavioral health resources/urgent care included in discharge paperwork. Pt refusing discharge, requesting to speak with charge nurse. Charge nurse notified.

## 2021-09-12 ENCOUNTER — Ambulatory Visit (HOSPITAL_COMMUNITY)
Admission: EM | Admit: 2021-09-12 | Discharge: 2021-09-12 | Disposition: A | Discharge status: Home / Self Care | Payer: Medicaid Other | Source: Home / Self Care

## 2021-09-12 ENCOUNTER — Encounter (HOSPITAL_COMMUNITY): Payer: Self-pay

## 2021-09-12 ENCOUNTER — Emergency Department (HOSPITAL_COMMUNITY)
Admission: EM | Admit: 2021-09-12 | Discharge: 2021-09-12 | Disposition: A | Discharge status: Other Acute Inpatient Hospital | Payer: Medicaid Other | Attending: Emergency Medicine | Admitting: Emergency Medicine

## 2021-09-12 DIAGNOSIS — F17210 Nicotine dependence, cigarettes, uncomplicated: Secondary | ICD-10-CM | POA: Insufficient documentation

## 2021-09-12 DIAGNOSIS — F1290 Cannabis use, unspecified, uncomplicated: Secondary | ICD-10-CM | POA: Insufficient documentation

## 2021-09-12 DIAGNOSIS — Z21 Asymptomatic human immunodeficiency virus [HIV] infection status: Secondary | ICD-10-CM | POA: Insufficient documentation

## 2021-09-12 DIAGNOSIS — F4321 Adjustment disorder with depressed mood: Secondary | ICD-10-CM | POA: Insufficient documentation

## 2021-09-12 DIAGNOSIS — Z7901 Long term (current) use of anticoagulants: Secondary | ICD-10-CM | POA: Insufficient documentation

## 2021-09-12 DIAGNOSIS — Z20822 Contact with and (suspected) exposure to covid-19: Secondary | ICD-10-CM | POA: Insufficient documentation

## 2021-09-12 DIAGNOSIS — Z79899 Other long term (current) drug therapy: Secondary | ICD-10-CM | POA: Insufficient documentation

## 2021-09-12 DIAGNOSIS — I110 Hypertensive heart disease with heart failure: Secondary | ICD-10-CM | POA: Diagnosis not present

## 2021-09-12 DIAGNOSIS — R45851 Suicidal ideations: Secondary | ICD-10-CM | POA: Diagnosis not present

## 2021-09-12 DIAGNOSIS — Z046 Encounter for general psychiatric examination, requested by authority: Secondary | ICD-10-CM | POA: Insufficient documentation

## 2021-09-12 DIAGNOSIS — I509 Heart failure, unspecified: Secondary | ICD-10-CM | POA: Diagnosis not present

## 2021-09-12 LAB — RESP PANEL BY RT-PCR (FLU A&B, COVID) ARPGX2
Influenza A by PCR: NEGATIVE
Influenza B by PCR: NEGATIVE
SARS Coronavirus 2 by RT PCR: NEGATIVE

## 2021-09-12 NOTE — ED Notes (Signed)
Patient arrived on unit, patient requesting to speak with NP.

## 2021-09-12 NOTE — BH Assessment (Signed)
Clinician messaged Prescott Parma, RN: "Hey. It's Trey with TTS. Is the pt able to be assessed? Also is he under IVC?"   Clinician awaiting response.   Redmond Pulling, MS, [facility], [facility] Triage Specialist [phone]

## 2021-09-12 NOTE — Discharge Instructions (Addendum)

## 2021-09-12 NOTE — ED Notes (Signed)
Pt changed into purple scrubs 

## 2021-09-12 NOTE — Discharge Instructions (Addendum)
Transfer to [facility] for observation

## 2021-09-12 NOTE — Progress Notes (Signed)
Pt is a transfer from [facility]. Pt is admitted to Continuous OBS due to SI with plan to hang self. Pt verbally contracts for safety on the unit. Pt is very labile, irritable and argumentative. Pt is able to redirect at this time. Pt is alert and oriented. Pt is ambulatory and is oriented to staff/unit. Pt denies pain and current HI/AVH. Staff will monitor for pt's safety.
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
## 2021-09-12 NOTE — Discharge Summary (Signed)
[name] to be D/C'd Home per FNP order. Discussed with the patient and all questions fully answered. An After Visit Summary was printed and given to the patient. Patient escorted out and D/C home via taxi.  Dickie La  09/12/2021 10:35 AM

## 2021-09-12 NOTE — ED Provider Notes (Signed)
FBC/OBS ASAP Discharge Summary  Date and Time: 09/12/2021 10:21 AM  Name: William Powers  MRN:  [id number]   Discharge Diagnoses:  Final diagnoses:  Adjustment disorder with depressed mood    Subjective: Patient states "I have no where to go, I live in [place] with a friend, they are out of town for Thanksgiving and I cannot have the keys when they are not home." Friend will return to [place] in approx. one week.  William Powers reports he would like to join family in [place] for Thanksgiving however he does not have transportation. He contracts verbally for safety with this Clinical research associatewriter.  Additional stressors include chronic medical diagnoses. He endorses compliance with medications currently.   No suicidal or homicidal ideations at this time. He denies history of suicide attempts.  He is not linked with outpatient psychiatry in [place]. William Powers is willing to follow up with outpatient psychiatry for medication management and therapy moving forward.   Patient is assessed face-to-face by nurse practitioner.  He is seated in observation, no acute distress.  He is alert and oriented, pleasant and cooperative during assessment.    He reports chronically depressed mood with congruent affect. He has normal speech and behavior.  He denies both auditory and visual hallucinations.  Patient is able to converse coherently with goal-directed thoughts and no distractibility or preoccupation.  He denies paranoia.  Objectively there is no evidence of psychosis/mania or delusional thinking.  Patient currently resides in [place], plans to [place] [facility] in the upcoming week. He states "I get SSI, I am [place] for [place] next Thursday when my check comes." William Powers denies alcohol and substance use at this time. UDS positive for marijuana. He endorses average sleep and appetite.   Patient offered support and encouragement.     Stay Summary: Patient [facility]  continuous observation area from [facility] department after reporting suicidal ideations. Patient remains voluntary at this time.   Total Time spent with patient: 30 minutes  Past Psychiatric History: polysubstance abuse, adjustment disorder Past Medical History:  Past Medical History:  Diagnosis Date   Heart failure (HCC)    HFrEF (heart failure with reduced ejection fraction) (HCC)    15% EF as of 2019   HIV (human immunodeficiency virus infection) (HCC)    Hypertension    Polysubstance abuse (HCC)    Pulmonary embolism (HCC)     Past Surgical History:  Procedure Laterality Date   HERNIA REPAIR     Family History:  Family History  Problem Relation Age of Onset   Lupus Mother    HIV Mother    HIV Father    Family Psychiatric History: none reported Social History:  Social History   Substance and Sexual Activity  Alcohol Use Yes   Comment: None since 2019     Social History   Substance and Sexual Activity  Drug Use Not Currently   Types: Marijuana    Social History   Socioeconomic History   Marital status: Divorced    Spouse name: Not on file   Number of children: Not on file   Years of education: Not on file   Highest education level: Not on file  Occupational History   Not on file  Tobacco Use   Smoking status: Some Days    Packs/day: 2.00    Types: Cigarettes   Smokeless tobacco: Never  Vaping Use   Vaping Use: Never used  Substance and Sexual Activity   Alcohol use:  Yes    Comment: None since 2019   Drug use: Not Currently    Types: Marijuana   Sexual activity: Not on file  Other Topics Concern   Not on file  Social History Narrative   Not on file   Social Determinants of Health   Financial Resource Strain: Not on file  Food Insecurity: Not on file  Transportation Needs: Not on file  Physical Activity: Not on file  Stress: Not on file  Social Connections: Not on file   SDOH:  SDOH Screenings   Alcohol Screen: Not on file   Depression (PHQ2-9): Low Risk    PHQ-2 Score: 0  Financial Resource Strain: Not on file  Food Insecurity: Not on file  Housing: Not on file  Physical Activity: Not on file  Social Connections: Not on file  Stress: Not on file  Tobacco Use: High Risk   Smoking Tobacco Use: Some Days   Smokeless Tobacco Use: Never   Passive Exposure: Not on file  Transportation Needs: Not on file    Tobacco Cessation:  A prescription for an FDA-approved tobacco cessation medication was offered at discharge and the patient refused  Current Medications:  No current facility-administered medications for this encounter.   Current Outpatient Medications  Medication Sig Dispense Refill   BIKTARVY 50-200-25 MG TABS tablet Take 1 tablet by mouth daily. 30 tablet 0   docusate sodium (COLACE) 100 MG capsule Take 1 capsule (100 mg total) by mouth every 12 (twelve) hours. (Patient taking differently: Take 100 mg by mouth daily as needed for mild constipation.) 60 capsule 0   eplerenone (INSPRA) 25 MG tablet Take 1 tablet (25 mg total) by mouth daily. 30 tablet 0   furosemide (LASIX) 40 MG tablet Take 1.5 tablets (60 mg total) by mouth daily. 45 tablet 0   hydrocortisone-pramoxine (PROCTOFOAM HC) rectal foam Place 1 applicator rectally 2 (two) times daily. 10 g 0   metoprolol succinate (TOPROL-XL) 50 MG 24 hr tablet Take 1 tablet (50 mg total) by mouth 2 (two) times daily. 60 tablet 0   potassium chloride SA (KLOR-CON) 20 MEQ tablet Take 1 tablet (20 mEq total) by mouth daily at 6 (six) AM. (Patient not taking: Reported on 09/09/2021) 30 tablet 0   rivaroxaban (XARELTO) 20 MG TABS tablet Take 1 tablet (20 mg total) by mouth daily with supper. 30 tablet 0    PTA Medications: (Not in a hospital admission)   Musculoskeletal  Strength & Muscle Tone: within normal limits Gait & Station: normal Patient leans: N/A  Psychiatric Specialty Exam  Presentation  General Appearance: Appropriate for Environment;  Casual  Eye Contact:Good  Speech:Clear and Coherent; Normal Rate  Speech Volume:Normal  Handedness:Right   Mood and Affect  Mood:Depressed  Affect:Appropriate; Congruent   Thought Process  Thought Processes:Coherent; Goal Directed; Linear  Descriptions of Associations:Intact  Orientation:Full (Time, Place and Person)  Thought Content:Logical; WDL  Diagnosis of Schizophrenia or Schizoaffective disorder in past: No    Hallucinations:Hallucinations: None  Ideas of Reference:None  Suicidal Thoughts:Suicidal Thoughts: No  Homicidal Thoughts:Homicidal Thoughts: No   Sensorium  Memory:Immediate Good; Recent Good; Remote Good  Judgment:Good  Insight:Fair   Executive Functions  Concentration:Good  Attention Span:Good  [place] of Knowledge:Good  Language:Good   Psychomotor Activity  Psychomotor Activity:Psychomotor Activity: Normal   Assets  Assets:Communication Skills; Desire for Improvement; Leisure Time; Resilience; Social Support   Sleep  Sleep:Sleep: Fair   Nutritional Assessment (For OBS and [facility] admissions only) Has the patient  had a weight loss or gain of 10 pounds or more in the last 3 months?: No Has the patient had a decrease in food intake/or appetite?: No Does the patient have dental problems?: No Does the patient have eating habits or behaviors that may be indicators of an eating disorder including binging or inducing vomiting?: No Has the patient recently lost weight without trying?: 0 Has the patient been eating poorly because of a decreased appetite?: 0 Malnutrition Screening Tool Score: 0   Physical Exam  Physical Exam Vitals and nursing note reviewed.  Constitutional:      Appearance: Normal appearance. He is well-developed.  HENT:     Head: Normocephalic and atraumatic.     Nose: Nose normal.  Cardiovascular:     Rate and Rhythm: Normal rate.  Pulmonary:     Effort: Pulmonary effort is normal.   Musculoskeletal:        General: Normal range of motion.     Cervical back: Normal range of motion.  Skin:    General: Skin is warm and dry.  Neurological:     Mental Status: He is alert and oriented to person, place, and time.  Psychiatric:        Attention and Perception: Attention and perception normal.        Mood and Affect: Affect normal. Mood is depressed.        Speech: Speech normal.        Behavior: Behavior normal. Behavior is cooperative.        Thought Content: Thought content normal.        Cognition and Memory: Cognition and memory normal.        Judgment: Judgment normal.   Review of Systems  Constitutional: Negative.   HENT: Negative.    Eyes: Negative.   Respiratory: Negative.    Cardiovascular: Negative.   Gastrointestinal: Negative.   Genitourinary: Negative.   Musculoskeletal: Negative.   Skin: Negative.   Neurological: Negative.   Endo/Heme/Allergies: Negative.   Psychiatric/Behavioral:  Positive for depression.   Blood pressure 112/76, pulse 90, resp. rate 16, SpO2 99 %. There is no height or weight on file to calculate BMI.  Demographic Factors:  Male and Low socioeconomic status  Loss Factors: NA  Historical Factors: NA  Risk Reduction Factors:   Sense of responsibility to family, Living with another person, especially a relative, Positive social support, Positive therapeutic relationship, and Positive coping skills or problem solving skills  Continued Clinical Symptoms:  Previous Psychiatric Diagnoses and Treatments  Cognitive Features That Contribute To Risk:  None    Suicide Risk:  Minimal: No identifiable suicidal ideation.  Patients presenting with no risk factors but with morbid ruminations; may be classified as minimal risk based on the severity of the depressive symptoms  Plan Of Care/Follow-up recommendations:  Patient reviewed with Dr Gasper Sells. Continue current medications.  Follow up with outpatient psychiatry, resources  provided.   Disposition: Discharge  Lenard Lance, FNP 09/12/2021, 10:21 AM

## 2021-09-12 NOTE — ED Triage Notes (Signed)
Pt come via [facility] from a hotel, walked in an stated that he was SOB and that he wanted to kill himself, states that he had a plan to hang himself but the paramedic took the sheet from him, denies HI, states it is the "devils night tonight"

## 2021-09-13 ENCOUNTER — Emergency Department (HOSPITAL_COMMUNITY)
Admission: EM | Admit: 2021-09-13 | Discharge: 2021-09-14 | Disposition: A | Discharge status: Home / Self Care | Payer: Medicaid Other | Attending: Emergency Medicine | Admitting: Emergency Medicine

## 2021-09-13 DIAGNOSIS — Z21 Asymptomatic human immunodeficiency virus [HIV] infection status: Secondary | ICD-10-CM | POA: Diagnosis not present

## 2021-09-13 DIAGNOSIS — K921 Melena: Secondary | ICD-10-CM | POA: Diagnosis not present

## 2021-09-13 DIAGNOSIS — R109 Unspecified abdominal pain: Secondary | ICD-10-CM | POA: Diagnosis not present

## 2021-09-13 DIAGNOSIS — I509 Heart failure, unspecified: Secondary | ICD-10-CM | POA: Diagnosis not present

## 2021-09-13 DIAGNOSIS — R319 Hematuria, unspecified: Secondary | ICD-10-CM

## 2021-09-13 DIAGNOSIS — R079 Chest pain, unspecified: Secondary | ICD-10-CM

## 2021-09-13 DIAGNOSIS — M545 Low back pain, unspecified: Secondary | ICD-10-CM | POA: Diagnosis not present

## 2021-09-14 ENCOUNTER — Telehealth (HOSPITAL_COMMUNITY): Payer: Self-pay | Admitting: Family Medicine

## 2021-09-14 ENCOUNTER — Emergency Department (HOSPITAL_COMMUNITY): Payer: Medicaid Other

## 2021-09-14 ENCOUNTER — Other Ambulatory Visit: Payer: Self-pay

## 2021-09-14 LAB — COMPREHENSIVE METABOLIC PANEL
ALT: 24 U/L (ref 0–44)
AST: 28 U/L (ref 15–41)
Albumin: 3.7 g/dL (ref 3.5–5.0)
Alkaline Phosphatase: 37 U/L — ABNORMAL LOW (ref 38–126)
Anion gap: 9 (ref 5–15)
BUN: 12 mg/dL (ref 6–20)
CO2: 23 mmol/L (ref 22–32)
Calcium: 8.5 mg/dL — ABNORMAL LOW (ref 8.9–10.3)
Chloride: 104 mmol/L (ref 98–111)
Creatinine, Ser: 1.53 mg/dL — ABNORMAL HIGH (ref 0.61–1.24)
GFR, Estimated: 58 mL/min — ABNORMAL LOW (ref >60)
Glucose, Bld: 96 mg/dL (ref 70–99)
Potassium: 3.2 mmol/L — ABNORMAL LOW (ref 3.5–5.1)
Sodium: 136 mmol/L (ref 135–145)
Total Bilirubin: 0.7 mg/dL (ref 0.3–1.2)
Total Protein: 7.2 g/dL (ref 6.5–8.1)

## 2021-09-14 LAB — URINALYSIS, ROUTINE W REFLEX MICROSCOPIC
Bacteria, UA: NONE SEEN
Bilirubin Urine: NEGATIVE
Glucose, UA: NEGATIVE mg/dL
Ketones, ur: NEGATIVE mg/dL
Leukocytes,Ua: NEGATIVE
Nitrite: NEGATIVE
Protein, ur: NEGATIVE mg/dL
Specific Gravity, Urine: 1.019 (ref 1.005–1.030)
pH: 6 (ref 5.0–8.0)

## 2021-09-14 LAB — CBC
HCT: 40.9 % (ref 39.0–52.0)
Hemoglobin: 13.2 g/dL (ref 13.0–17.0)
MCH: 32.2 pg (ref 26.0–34.0)
MCHC: 32.3 g/dL (ref 30.0–36.0)
MCV: 99.8 fL (ref 80.0–100.0)
Platelets: 304 10*3/uL (ref 150–400)
RBC: 4.1 MIL/uL — ABNORMAL LOW (ref 4.22–5.81)
RDW: 13.6 % (ref 11.5–15.5)
WBC: 7 10*3/uL (ref 4.0–10.5)
nRBC: 0 % (ref 0.0–0.2)

## 2021-09-14 LAB — LIPASE, BLOOD: Lipase: 44 U/L (ref 11–51)

## 2021-09-14 LAB — TROPONIN I (HIGH SENSITIVITY)
Troponin I (High Sensitivity): 4 ng/L (ref <18)
Troponin I (High Sensitivity): 6 ng/L (ref <18)

## 2021-09-14 MED ORDER — ACETAMINOPHEN 500 MG PO TABS
1000.0000 mg | ORAL_TABLET | Freq: Once | ORAL | Status: DC
  Filled 2021-09-14: qty 2

## 2021-09-14 MED ORDER — IOPAMIDOL (ISOVUE-370) INJECTION 76%
100.0000 mL | Freq: Once | INTRAVENOUS | Status: AC | PRN
  Administered 2021-09-14: 100 mL via INTRAVENOUS

## 2021-09-14 NOTE — ED Notes (Addendum)
Patient asleep.  Will open eyes to voice and goes back to sleep.  Attempted to administer Tylenol and patient continued to fall asleep.  Medication not given at this time

## 2021-09-14 NOTE — Discharge Instructions (Signed)
You were evaluated in the Emergency Department and after careful evaluation, we did not find any emergent condition requiring admission or further testing in the hospital.  Your exam/testing today is overall reassuring.  Please return to the Emergency Department if you experience any worsening of your condition.   Thank you for allowing us to be a part of your care. 

## 2021-09-14 NOTE — ED Provider Notes (Signed)
[facility]EM[facility]ENC[facility] DEPT [facility] Emergency Department Provider Note MRN:  [id number]  Arrival date & time: 09/14/21     Chief Complaint   Hematuria and Dysuria   History of Present Illness   William Powers is a 40 y.o. year-old male with a history of HIV, CHF presenting to the ED with chief complaint of hematuria.  Patient is endorsing hematuria as well as blood in stool for the past 2 or 3 days.  Cannot urinate without also defecating.  Also endorsing abdominal pain and lower back pain which is new.  No fever, no headache, endorsing occasional chest pain, some shortness of breath with the pain.  Symptoms are constant, moderate, no exacerbating or alleviating factors.  Review of Systems  A complete 10 system review of systems was obtained and all systems are negative except as noted in the HPI and PMH.   Patient's Health History   No past medical history on file.    No family history on file.  Social History   Socioeconomic History   Marital status: Unknown    Spouse name: Not on file   Number of children: Not on file   Years of education: Not on file   Highest education level: Not on file  Occupational History   Not on file  Tobacco Use   Smoking status: Not on file   Smokeless tobacco: Not on file  Substance and Sexual Activity   Alcohol use: Not on file   Drug use: Not on file   Sexual activity: Not on file  Other Topics Concern   Not on file  Social History Narrative   Not on file   Social Determinants of Health   Financial Resource Strain: Not on file  Food Insecurity: Not on file  Transportation Needs: Not on file  Physical Activity: Not on file  Stress: Not on file  Social Connections: Not on file  Intimate Partner Violence: Not on file     Physical Exam   Vitals:   09/14/21 0245 09/14/21 0300  BP: 111/81 114/83  Pulse: 81 81  Resp: 20 20  Temp:    SpO2: 97% 98%    CONSTITUTIONAL: Well-appearing, NAD NEURO:  Alert and oriented x 3, no  focal deficits EYES:  eyes equal and reactive ENT/NECK:  no LAD, no JVD CARDIO: Regular rate, well-perfused, normal S1 and S2 PULM:  CTAB no wheezing or rhonchi GI/GU:  normal bowel sounds, non-distended, non-tender MSK/SPINE:  No gross deformities, no edema SKIN:  no rash, atraumatic PSYCH:  Appropriate speech and behavior  *Additional and/or pertinent findings included in MDM below  Diagnostic and Interventional Summary    EKG Interpretation  Date/Time:  Saturday September 14 2021 00:39:27 EST Ventricular Rate:  77 PR Interval:  186 QRS Duration: 104 QT Interval:  373 QTC Calculation: 423 R Axis:   74 Text Interpretation: Sinus rhythm Consider left atrial enlargement RSR' in V1 or V2, probably normal variant ST elev, probable normal early repol pattern Confirmed by Kennis Carina [id number]) on 09/14/2021 1:48:02 AM       Labs Reviewed  CBC - Abnormal; Notable for the following components:      Result Value   RBC 4.10 (*)    All other components within normal limits  COMPREHENSIVE METABOLIC PANEL - Abnormal; Notable for the following components:   Potassium 3.2 (*)    Creatinine, Ser 1.53 (*)    Calcium 8.5 (*)    Alkaline Phosphatase 37 (*)    GFR, Estimated 58 (*)  All other components within normal limits  URINALYSIS, ROUTINE W REFLEX MICROSCOPIC - Abnormal; Notable for the following components:   Hgb urine dipstick SMALL (*)    All other components within normal limits  LIPASE, BLOOD  TROPONIN I (HIGH SENSITIVITY)  TROPONIN I (HIGH SENSITIVITY)    CT ABDOMEN PELVIS W CONTRAST  Final Result    DG Chest 2 View  Final Result      Medications  acetaminophen (TYLENOL) tablet 1,000 mg (1,000 mg Oral Not Given 09/14/21 0117)  iopamidol (ISOVUE-370) 76 % injection 100 mL (100 mLs Intravenous Contrast Given 09/14/21 0326)     Procedures  /  Critical Care Procedures  ED Course and Medical Decision Making  I have reviewed the triage vital signs, the nursing  notes, and pertinent available records from the EMR.  Listed above are laboratory and imaging tests that I personally ordered, reviewed, and interpreted and then considered in my medical decision making (see below for details).  CT to evaluate for intra-abdominal fistula or other reason to explain the concomitant hematuria with hematochezia.  Also considering UTI, hemorrhoids, lower GI bleed, constipation.     Work-up is normal, normal labs, normal CT.  Upon further chart review, patient has a frequent visitor of the emergency department, has had these similar or same complaints multiple times in the past with multiple reassuring work-ups.  Nothing to suggest emergent process, appropriate for discharge.  Barth Kirks. [place] Small, [place] [email or web address]  Final Clinical Impressions(s) / ED Diagnoses     ICD-10-CM   1. Hematuria, unspecified type  R31.9     2. Chest pain  R07.9 DG Chest 2 View    DG Chest 2 View    3. Blood in stool  K92.1       ED Discharge Orders     None        Discharge Instructions Discussed with and Provided to Patient:     Discharge Instructions      You were evaluated in the Emergency Department and after careful evaluation, we did not find any emergent condition requiring admission or further testing in the hospital.  Your exam/testing today is overall reassuring.  Please return to the Emergency Department if you experience any worsening of your condition.   Thank you for allowing [place] to be a part of your care.        Maudie Flakes, MD 09/14/21 [phone]

## 2021-09-14 NOTE — BH Assessment (Signed)
Care Management - Follow Up Discharges   Writer attempted to make contact with patient today and was unsuccessful.  Writer left a HIPPA compliant voice message.   Per chart review, patient provided with outpatient resources.  

## 2021-09-14 NOTE — ED Notes (Signed)
Attempting to d/c patient.  Patient upset about being discharged and not being able to have a second option.  Given d/c paperwork and patient ripped papers and threw them in trash.  States "they just don't care.  I will just keep coming back until they find out and tell me what is wrong."

## 2021-09-14 NOTE — ED Notes (Signed)
Patient sleeping at this time.

## 2021-09-14 NOTE — ED Triage Notes (Signed)
Patient BIB EMS for evaluation of dysuria and hematuria.  Reports symptoms started "a few months ago.  It was every other day or so and now it is every 30 minutes.  I was told to follow up but they are saying it could be 4 months before I can be seen."

## 2021-09-14 NOTE — ED Notes (Signed)
Patient refused to have vitals taken prior to discharge.  Removed himself off monitors

## 2021-09-14 NOTE — ED Notes (Signed)
Pt disconnected self from monitor and called out stating "I have a right to see another doctor I want a second opinion". Pt visually upset. RN notified of pts request.

## 2021-09-18 DIAGNOSIS — Z20822 Contact with and (suspected) exposure to covid-19: Secondary | ICD-10-CM | POA: Insufficient documentation

## 2021-09-18 DIAGNOSIS — B20 Human immunodeficiency virus [HIV] disease: Secondary | ICD-10-CM | POA: Diagnosis not present

## 2021-09-18 DIAGNOSIS — D62 Acute posthemorrhagic anemia: Secondary | ICD-10-CM | POA: Insufficient documentation

## 2021-09-18 DIAGNOSIS — I5020 Unspecified systolic (congestive) heart failure: Secondary | ICD-10-CM | POA: Insufficient documentation

## 2021-09-18 DIAGNOSIS — F17210 Nicotine dependence, cigarettes, uncomplicated: Secondary | ICD-10-CM | POA: Diagnosis not present

## 2021-09-18 DIAGNOSIS — K922 Gastrointestinal hemorrhage, unspecified: Secondary | ICD-10-CM | POA: Diagnosis present

## 2021-09-18 DIAGNOSIS — Z79899 Other long term (current) drug therapy: Secondary | ICD-10-CM | POA: Diagnosis not present

## 2021-09-18 DIAGNOSIS — E876 Hypokalemia: Secondary | ICD-10-CM | POA: Insufficient documentation

## 2021-09-18 DIAGNOSIS — Y900 Blood alcohol level of less than 20 mg/100 ml: Secondary | ICD-10-CM | POA: Diagnosis not present

## 2021-09-18 DIAGNOSIS — I110 Hypertensive heart disease with heart failure: Secondary | ICD-10-CM | POA: Insufficient documentation

## 2021-09-19 ENCOUNTER — Encounter (HOSPITAL_COMMUNITY): Payer: Self-pay

## 2021-09-19 ENCOUNTER — Other Ambulatory Visit: Payer: Self-pay

## 2021-09-19 ENCOUNTER — Observation Stay (HOSPITAL_COMMUNITY)
Admission: EM | Admit: 2021-09-19 | Discharge: 2021-09-19 | Disposition: A | Discharge status: Home / Self Care | Payer: Medicaid Other | Attending: Internal Medicine | Admitting: Internal Medicine

## 2021-09-19 DIAGNOSIS — F10920 Alcohol use, unspecified with intoxication, uncomplicated: Secondary | ICD-10-CM

## 2021-09-19 DIAGNOSIS — K922 Gastrointestinal hemorrhage, unspecified: Principal | ICD-10-CM

## 2021-09-19 DIAGNOSIS — E876 Hypokalemia: Secondary | ICD-10-CM | POA: Diagnosis present

## 2021-09-19 DIAGNOSIS — I2693 Single subsegmental pulmonary embolism without acute cor pulmonale: Secondary | ICD-10-CM | POA: Diagnosis present

## 2021-09-19 DIAGNOSIS — D62 Acute posthemorrhagic anemia: Secondary | ICD-10-CM | POA: Diagnosis present

## 2021-09-19 DIAGNOSIS — R195 Other fecal abnormalities: Secondary | ICD-10-CM

## 2021-09-19 DIAGNOSIS — E519 Thiamine deficiency, unspecified: Secondary | ICD-10-CM | POA: Diagnosis present

## 2021-09-19 DIAGNOSIS — D649 Anemia, unspecified: Secondary | ICD-10-CM

## 2021-09-19 DIAGNOSIS — B20 Human immunodeficiency virus [HIV] disease: Secondary | ICD-10-CM | POA: Diagnosis present

## 2021-09-19 DIAGNOSIS — F1011 Alcohol abuse, in remission: Secondary | ICD-10-CM | POA: Diagnosis present

## 2021-09-19 LAB — COMPREHENSIVE METABOLIC PANEL
ALT: 32 U/L (ref 0–44)
AST: 34 U/L (ref 15–41)
Albumin: 4.4 g/dL (ref 3.5–5.0)
Alkaline Phosphatase: 44 U/L (ref 38–126)
Anion gap: 15 (ref 5–15)
BUN: 17 mg/dL (ref 6–20)
CO2: 20 mmol/L — ABNORMAL LOW (ref 22–32)
Calcium: 8.9 mg/dL (ref 8.9–10.3)
Chloride: 104 mmol/L (ref 98–111)
Creatinine, Ser: 1.35 mg/dL — ABNORMAL HIGH (ref 0.61–1.24)
GFR, Estimated: >60 mL/min (ref >60)
Glucose, Bld: 124 mg/dL — ABNORMAL HIGH (ref 70–99)
Potassium: 3.1 mmol/L — ABNORMAL LOW (ref 3.5–5.1)
Sodium: 139 mmol/L (ref 135–145)
Total Bilirubin: 0.5 mg/dL (ref 0.3–1.2)
Total Protein: 8.6 g/dL — ABNORMAL HIGH (ref 6.5–8.1)

## 2021-09-19 LAB — URINALYSIS, ROUTINE W REFLEX MICROSCOPIC
Bilirubin Urine: NEGATIVE
Glucose, UA: NEGATIVE mg/dL
Ketones, ur: NEGATIVE mg/dL
Leukocytes,Ua: NEGATIVE
Nitrite: NEGATIVE
Protein, ur: NEGATIVE mg/dL
Specific Gravity, Urine: 1.015 (ref 1.005–1.030)
pH: 6 (ref 5.0–8.0)

## 2021-09-19 LAB — RETICULOCYTES
Immature Retic Fract: 15.7 % (ref 2.3–15.9)
RBC.: 3.47 MIL/uL — ABNORMAL LOW (ref 4.22–5.81)
Retic Count, Absolute: 68.4 10*3/uL (ref 19.0–186.0)
Retic Ct Pct: 2 % (ref 0.4–3.1)

## 2021-09-19 LAB — CBC
HCT: 42.8 % (ref 39.0–52.0)
Hemoglobin: 14.1 g/dL (ref 13.0–17.0)
MCH: 32.6 pg (ref 26.0–34.0)
MCHC: 32.9 g/dL (ref 30.0–36.0)
MCV: 98.8 fL (ref 80.0–100.0)
Platelets: 351 10*3/uL (ref 150–400)
RBC: 4.33 MIL/uL (ref 4.22–5.81)
RDW: 13.7 % (ref 11.5–15.5)
WBC: 7.3 10*3/uL (ref 4.0–10.5)
nRBC: 0 % (ref 0.0–0.2)

## 2021-09-19 LAB — MAGNESIUM: Magnesium: 2.1 mg/dL (ref 1.7–2.4)

## 2021-09-19 LAB — HEMOGLOBIN AND HEMATOCRIT, BLOOD
HCT: 35.9 % — ABNORMAL LOW (ref 39.0–52.0)
HCT: 31.8 % — ABNORMAL LOW (ref 39.0–52.0)
HCT: 39.8 % (ref 39.0–52.0)
Hemoglobin: 11.4 g/dL — ABNORMAL LOW (ref 13.0–17.0)
Hemoglobin: 10.5 g/dL — ABNORMAL LOW (ref 13.0–17.0)
Hemoglobin: 12.9 g/dL — ABNORMAL LOW (ref 13.0–17.0)

## 2021-09-19 LAB — PHOSPHORUS: Phosphorus: 4.2 mg/dL (ref 2.5–4.6)

## 2021-09-19 LAB — IRON AND TIBC
Iron: 45 ug/dL (ref 45–182)
Saturation Ratios: 12 % — ABNORMAL LOW (ref 17.9–39.5)
TIBC: 364 ug/dL (ref 250–450)
UIBC: 319 ug/dL

## 2021-09-19 LAB — VITAMIN B12: Vitamin B-12: 140 pg/mL — ABNORMAL LOW (ref 180–914)

## 2021-09-19 LAB — ETHANOL: Alcohol, Ethyl (B): 127 mg/dL — ABNORMAL HIGH (ref <10)

## 2021-09-19 LAB — FERRITIN: Ferritin: 306 ng/mL (ref 24–336)

## 2021-09-19 LAB — TYPE AND SCREEN
ABO/RH(D): A POS
Antibody Screen: NEGATIVE

## 2021-09-19 LAB — RESP PANEL BY RT-PCR (FLU A&B, COVID) ARPGX2
Influenza A by PCR: NEGATIVE
Influenza B by PCR: NEGATIVE
SARS Coronavirus 2 by RT PCR: NEGATIVE

## 2021-09-19 LAB — LIPASE, BLOOD: Lipase: 33 U/L (ref 11–51)

## 2021-09-19 LAB — FOLATE: Folate: 6.4 ng/mL (ref >5.9)

## 2021-09-19 MED ORDER — ONDANSETRON HCL 4 MG/2ML IJ SOLN: 4.0000 mg | Freq: Four times a day (QID) | INTRAMUSCULAR | Status: DC | PRN

## 2021-09-19 MED ORDER — LORAZEPAM 2 MG/ML IJ SOLN: 0.0000 mg | Freq: Three times a day (TID) | INTRAMUSCULAR | Status: DC

## 2021-09-19 MED ORDER — LORAZEPAM 2 MG/ML IJ SOLN
0.0000 mg | INTRAMUSCULAR | Status: DC
Start: 2021-09-19 — End: 2021-09-19

## 2021-09-19 MED ORDER — LACTATED RINGERS IV BOLUS
1000.0000 mL | Freq: Once | INTRAVENOUS | Status: AC
  Administered 2021-09-19: 1000 mL via INTRAVENOUS

## 2021-09-19 MED ORDER — ONDANSETRON 4 MG PO TBDP
4.0000 mg | ORAL_TABLET | Freq: Once | ORAL | Status: AC | PRN
  Administered 2021-09-19: 4 mg via ORAL
  Filled 2021-09-19: qty 1

## 2021-09-19 MED ORDER — POTASSIUM CHLORIDE IN NACL 20-0.9 MEQ/L-% IV SOLN: INTRAVENOUS | Status: DC

## 2021-09-19 MED ORDER — SODIUM CHLORIDE 0.9 % IV BOLUS: 1000.0000 mL | Freq: Once | INTRAVENOUS | Status: DC

## 2021-09-19 MED ORDER — MAGNESIUM SULFATE 2 GM/50ML IV SOLN
2.0000 g | Freq: Once | INTRAVENOUS | Status: AC
  Administered 2021-09-19: 2 g via INTRAVENOUS
  Filled 2021-09-19: qty 50

## 2021-09-19 MED ORDER — LORAZEPAM 1 MG PO TABS: 1.0000 mg | ORAL_TABLET | ORAL | Status: DC | PRN

## 2021-09-19 MED ORDER — FOLIC ACID 1 MG PO TABS
1.0000 mg | ORAL_TABLET | Freq: Every day | ORAL | Status: DC
  Administered 2021-09-19: 1 mg via ORAL
  Filled 2021-09-19: qty 1

## 2021-09-19 MED ORDER — THIAMINE HCL 100 MG/ML IJ SOLN: 100.0000 mg | Freq: Every day | INTRAMUSCULAR | Status: DC

## 2021-09-19 MED ORDER — BICTEGRAVIR-EMTRICITAB-TENOFOV 50-200-25 MG PO TABS: 1.0000 | ORAL_TABLET | Freq: Every day | ORAL | Status: DC

## 2021-09-19 MED ORDER — ONDANSETRON HCL 4 MG PO TABS: 4.0000 mg | ORAL_TABLET | Freq: Four times a day (QID) | ORAL | Status: DC | PRN

## 2021-09-19 MED ORDER — THIAMINE HCL 100 MG PO TABS
100.0000 mg | ORAL_TABLET | Freq: Every day | ORAL | Status: DC
  Administered 2021-09-19: 100 mg via ORAL
  Filled 2021-09-19: qty 1

## 2021-09-19 MED ORDER — ADULT MULTIVITAMIN W/MINERALS CH
1.0000 | ORAL_TABLET | Freq: Every day | ORAL | Status: DC
  Administered 2021-09-19: 1 via ORAL
  Filled 2021-09-19: qty 1

## 2021-09-19 MED ORDER — LORAZEPAM 2 MG/ML IJ SOLN: 1.0000 mg | INTRAMUSCULAR | Status: DC | PRN

## 2021-09-19 MED ORDER — ACETAMINOPHEN 325 MG PO TABS: 650.0000 mg | ORAL_TABLET | Freq: Four times a day (QID) | ORAL | Status: DC | PRN

## 2021-09-19 MED ORDER — PANTOPRAZOLE SODIUM 40 MG IV SOLR
40.0000 mg | Freq: Every day | INTRAVENOUS | Status: DC
  Administered 2021-09-19: 40 mg via INTRAVENOUS
  Filled 2021-09-19: qty 40

## 2021-09-19 MED ORDER — ACETAMINOPHEN 650 MG RE SUPP: 650.0000 mg | Freq: Four times a day (QID) | RECTAL | Status: DC | PRN

## 2021-09-19 MED ORDER — CYANOCOBALAMIN 1000 MCG/ML IJ SOLN: 1000.0000 ug | Freq: Once | INTRAMUSCULAR | Status: DC

## 2021-09-19 MED ORDER — PEG 3350-KCL-NA BICARB-NACL 420 G PO SOLR: 4000.0000 mL | Freq: Once | ORAL | Status: DC

## 2021-09-19 MED ORDER — POTASSIUM CHLORIDE 10 MEQ/100ML IV SOLN
10.0000 meq | INTRAVENOUS | Status: AC
  Administered 2021-09-19 (×3): 10 meq via INTRAVENOUS
  Filled 2021-09-19 (×3): qty 100

## 2021-09-19 MED ORDER — VITAMIN B-12 1000 MCG PO TABS
1000.0000 ug | ORAL_TABLET | Freq: Every day | ORAL | Status: DC
Start: 2021-09-19 — End: 2021-09-19

## 2021-09-19 MED ORDER — SODIUM CHLORIDE 0.9 % IV SOLN: INTRAVENOUS | Status: DC

## 2021-09-19 NOTE — ED Notes (Signed)
Pt stating he is ready to leave and wants to talk with PA. PA notified.

## 2021-09-19 NOTE — ED Notes (Signed)
Pt able to walk with steady gait out of ED.

## 2021-09-19 NOTE — Discharge Instructions (Addendum)
You were evaluated in the Emergency Department and after careful evaluation, we did not find any emergent condition requiring admission or further testing in the hospital.  Your potassium was low today, which has been repleted.  Please decrease the amount of alcohol you drink as this is harmful to your overall health.   Your rectal exam showed that you do have evidence of blood in your stool. It is very important that you follow up with the GI (stomach) doctor to have your blood counts rechecked and have this further evaluated, especially since you are taking Eliquis which puts you at a higher risk of a stomach bleed. Please call the phone number on your discharge paperwork to schedule an appointment   Please return to the Emergency Department if you experience worsening bleeding, weakness, dizziness, passing out, vomiting, etc.  Thank you for allowing [place] to be a part of your care.

## 2021-09-19 NOTE — Consult Note (Signed)
Referring Provider: ED Primary Care Physician:  Pcp, No Primary Gastroenterologist:  Gentry Fitz  Reason for Consultation:  Rectal bleeding, nausea, vomiting.  HPI: William Powers is a 40 y.o. male  documented history of HFrEF with an EF of 15% as of 2019, however echo as of 07/23/2021 shows an EF of 55 to 60%, HIV, hypertension, polysubstance abuse, PE on Eliquis, history of EtOH abuse, presents for abdominal pain, vomiting, and anemia.  Patient states last night he had nausea and vomiting due to his intoxication. States he had some abdominal pain associated with vomiting, no longer having pain. Denies bloody emesis, states it was mainly liquid from drinking alcohol. States he has not seen a provider since 2021 and is unsure what medications he is supposed to be on. Takes lisinopril, metoprolol, and Vicktarvy.  Patient has been seen by [facility] at previous hospital visits, suggested EGD at one point but patient turned it down as he was scheduled for outpatient procedures in [place]. States he has not gotten those procedures done. Is not on a blood thinner. No previous history of colonoscopy/egd. Denies family history of colon cancer. Of note, patient has been to ED 22 times in the last month.  He states he see bright red blood with almost every bowel movement. States he often has to strain vigorously during bowel movements. So much so that "he runs out of breath" trying to push it out.  Patient also states for the last month he has been waking up with urine on him, involuntarily.  Past Medical History:  Diagnosis Date   Heart failure (HCC)    HFrEF (heart failure with reduced ejection fraction) (HCC)    15% EF as of 2019   HIV (human immunodeficiency virus infection) (HCC)    Hypertension    Polysubstance abuse (HCC)    Pulmonary embolism (HCC)     Past Surgical History:  Procedure Laterality Date   HERNIA REPAIR      Prior to Admission medications   Medication Sig Start Date End  Date Taking? Authorizing Provider  BIKTARVY 50-200-25 MG TABS tablet Take 1 tablet by mouth daily. 09/05/21   Renne Crigler, PA-C  BIKTARVY 50-200-25 MG TABS tablet Take 1 tablet by mouth daily. 09/05/21   [provider]  docusate sodium (COLACE) 100 MG capsule Take 1 capsule (100 mg total) by mouth every 12 (twelve) hours. Patient taking differently: Take 100 mg by mouth daily as needed for mild constipation. 08/31/21   Gwyneth Sprout, MD  eplerenone (INSPRA) 25 MG tablet Take 1 tablet (25 mg total) by mouth daily. 09/05/21   Renne Crigler, PA-C  eplerenone (INSPRA) 25 MG tablet Take 25 mg by mouth daily. 08/14/21   [provider]  furosemide (LASIX) 40 MG tablet Take 1.5 tablets (60 mg total) by mouth daily. 09/05/21 10/20/23  Renne Crigler, PA-C  furosemide (LASIX) 40 MG tablet Take 40 mg by mouth daily. 09/05/21   [provider]  hydrocortisone-pramoxine (PROCTOFOAM HC) rectal foam Place 1 applicator rectally 2 (two) times daily. 09/09/21   Dione Booze, MD  metoprolol succinate (TOPROL-XL) 50 MG 24 hr tablet Take 1 tablet (50 mg total) by mouth 2 (two) times daily. 09/05/21   Renne Crigler, PA-C  metoprolol succinate (TOPROL-XL) 50 MG 24 hr tablet Take 50 mg by mouth daily. 05/12/21   [provider]  potassium chloride SA (KLOR-CON) 20 MEQ tablet Take 1 tablet (20 mEq total) by mouth daily at 6 (six) AM. Patient not taking: Reported on  09/09/2021 09/05/21   Renne Crigler, PA-C  rivaroxaban (XARELTO) 20 MG TABS tablet Take 1 tablet (20 mg total) by mouth daily with supper. 09/05/21   Renne Crigler, PA-C  Potassium Chloride ER 20 MEQ TBCR Take 20 mEq by mouth daily at 6 (six) AM. Patient not taking: No sig reported 08/07/21 09/05/21  Bobette Mo, MD    Scheduled Meds: Continuous Infusions:  potassium chloride 10 mEq (09/19/21 0728)   PRN Meds:.  Allergies as of 09/18/2021 - Review Complete 09/14/2021  Allergen Reaction Noted   Bee  venom Anaphylaxis 06/23/2013   Lisinopril Swelling 03/02/2020   Bee venom Swelling 09/14/2021   Lisinopril Swelling 09/14/2021   Septra [sulfamethoxazole-trimethoprim] Swelling 09/14/2021   Sulfamethoxazole-trimethoprim Rash and Swelling 12/22/2018    Family History  Problem Relation Age of Onset   Lupus Mother    HIV Mother    HIV Father     Social History   Socioeconomic History   Marital status: Divorced    Spouse name: Not on file   Number of children: Not on file   Years of education: Not on file   Highest education level: Not on file  Occupational History   Not on file  Tobacco Use   Smoking status: Some Days    Packs/day: 2.00    Types: Cigarettes   Smokeless tobacco: Never  Vaping Use   Vaping Use: Never used  Substance and Sexual Activity   Alcohol use: Yes    Comment: None since 2019   Drug use: Not Currently    Types: Marijuana   Sexual activity: Not on file  Other Topics Concern   Not on file  Social History Narrative   Not on file   Social Determinants of Health   Financial Resource Strain: Not on file  Food Insecurity: Not on file  Transportation Needs: Not on file  Physical Activity: Not on file  Stress: Not on file  Social Connections: Not on file  Intimate Partner Violence: Not on file    Review of Systems: Review of Systems  Constitutional:  Negative for chills and fever.  HENT:  Negative for sinus pain and sore throat.   Eyes:  Negative for pain and redness.  Respiratory:  Negative for cough and hemoptysis.   Cardiovascular:  Negative for chest pain and palpitations.  Gastrointestinal:  Positive for abdominal pain, blood in stool, constipation, nausea and vomiting. Negative for diarrhea, heartburn and melena.  Genitourinary:  Negative for dysuria and urgency.  Musculoskeletal:  Negative for myalgias and neck pain.  Skin:  Negative for itching and rash.  Neurological:  Negative for seizures and loss of consciousness.   Psychiatric/Behavioral:  Positive for substance abuse. The patient is not nervous/anxious.     Physical Exam:Physical Exam Constitutional:      General: He is not in acute distress.    Appearance: Normal appearance.  HENT:     Head: Normocephalic and atraumatic.     Nose: Nose normal. No congestion.     Mouth/Throat:     Mouth: Mucous membranes are moist.     Pharynx: Oropharynx is clear.  Eyes:     General: No scleral icterus.    Extraocular Movements: Extraocular movements intact.     Conjunctiva/sclera: Conjunctivae normal.  Cardiovascular:     Rate and Rhythm: Normal rate and regular rhythm.  Pulmonary:     Effort: Pulmonary effort is normal. No respiratory distress.  Abdominal:     General: Abdomen is flat. Bowel sounds are normal.  There is no distension.     Palpations: Abdomen is soft. There is no mass.     Tenderness: There is no abdominal tenderness. There is no guarding or rebound.     Hernia: No hernia is present.  Musculoskeletal:        General: No swelling. Normal range of motion.     Cervical back: Normal range of motion. No rigidity.  Skin:    General: Skin is warm.     Coloration: Skin is not jaundiced.  Neurological:     General: No focal deficit present.     Mental Status: He is alert and oriented to person, place, and time.  Psychiatric:        Mood and Affect: Mood normal.        Behavior: Behavior normal.        Thought Content: Thought content normal.        Judgment: Judgment normal.    Vital signs: Vitals:   09/19/21 0600 09/19/21 0733  BP: 126/72 (!) 145/93  Pulse: 79 93  Resp: 15 18  Temp:    SpO2: 100% 99%        GI:  Lab Results: Recent Labs    09/19/21 0100 09/19/21 0703  WBC 7.3  --   HGB 14.1 11.4*  HCT 42.8 35.9*  PLT 351  --    BMET Recent Labs    09/19/21 0100  NA 139  K 3.1*  CL 104  CO2 20*  GLUCOSE 124*  BUN 17  CREATININE 1.35*  CALCIUM 8.9   LFT Recent Labs    09/19/21 0100  PROT 8.6*  ALBUMIN  4.4  AST 34  ALT 32  ALKPHOS 44  BILITOT 0.5   PT/INR No results for input(s): LABPROT, INR in the last 72 hours.   Studies/Results: No results found.  Impression: Rectal bleeding, acute anemia - HGB 11.4 (13.2 11/26) - BUN 17, Cr. 1.35 - Liver function normal - Ethanol 127 - FOBT positive - CT ab/pelvis w contrast 11/26: scarring on left kidney, otherwise unrevealing  Hypokalemia - likely due to nausea and vomiting associated with intoxication, nonbloody - Potassium 3.1  Plan: No bloody emesis, not currently having nausea, vomiting pain. FOBT positive with mild acute anemia, will obtain iron/ferritin/anemia labs.  Patient will ultimately need a colonoscopy at some point due to persistent rectal bleeding. Though source possibly hemorrhoids with history of straining, cannot rule out malicious etiology. Recommend colonoscopy as an outpatient since patient is stable at this time.  Continue to replete electrolytes.  [facility] will follow.    LOS: 0 days   Davone Shinault Leanna Sato  PA-C 09/19/2021, 8:10 AM  Contact #  [phone]

## 2021-09-19 NOTE — H&P (Signed)
History and Physical    William Powers [id number] DOB: 1980/11/10 DOA: 09/19/2021  PCP: Pcp, No   Patient coming from: Home.  I have personally briefly reviewed patient's old medical records in [place]  Chief Complaint: Rectal bleeding.  HPI: William Powers is a 40 y.o. male with medical history significant of heart failure with an EF of 15% in 2019, but 55% on most recent echocardiogram, history of HIV, hypertension, polysubstance abuse, diagnosed with a pulmonary embolism in October, class I obesity who is coming to the emergency department due to mid abdominal pain associated with multiple episodes of emesis before arrival.  He became hypotensive in the emergency department and a rectal examination was done that was positive for fecal occult blood.  He has no significant abdominal pain at this time.  He was initially lightheaded but denies further symptoms.  No dysuria, flank pain, frequency or hematuria.  No fever, chills, sore throat, rhinorrhea, dyspnea, wheezing or hemoptysis.  No chest pain, palpitations, diaphoresis, PND, orthopnea or recent pitting edema lower extremities.  No polyuria, polydipsia, polyphagia or blurred vision.  ED Course: 98.3 F, pulse 113, respiration 12, BP 103/66 mmHg O2 sat 96% on room air.  The patient was given 2000 mL of LR bolus, KCl IVPB replacement, ondansetron disintegrating 4 mg tablet and GI was consulted.  Lab work: His initial CBC was normal with an H&H of 14.1/42.8, follow-up with 11.4/35.9 and last 1 was 10.5/31.8.  His urinalysis showed small hemoglobinuria.  CMP showed a potassium of 3.1 and CO2 of 20 mmol/L.  Glucose of 124 and creatinine of 1.35 mg/dL.  Total protein 8.6 g/dL.  Lipase was normal.  Anemia panel was significant for decreased iron saturation and low vitamin B12 level 140 pg/mL.  Review of Systems: As per HPI otherwise all other systems reviewed and are negative.  Past Medical History:  Diagnosis Date   Heart  failure ([place])    HFrEF (heart failure with reduced ejection fraction) (HCC)    15% EF as of 2019   HIV (human immunodeficiency virus infection) ([place])    Hypertension    Polysubstance abuse ([place])    Pulmonary embolism ([place])    Past Surgical History:  Procedure Laterality Date   HERNIA REPAIR     Social History  reports that he has been smoking cigarettes. He has been smoking an average of 2 packs per day. He has never used smokeless tobacco. He reports current alcohol use. He reports that he does not currently use drugs after having used the following drugs: Marijuana.  Allergies  Allergen Reactions   Bee Venom Anaphylaxis   Lisinopril Swelling    Reported on 5/14 by him to his my chart account : was in ED for swollen lips     Bee Venom Swelling   Lisinopril Swelling   Septra [Sulfamethoxazole-Trimethoprim] Swelling   Sulfamethoxazole-Trimethoprim Rash and Swelling   Family History  Problem Relation Age of Onset   Lupus Mother    HIV Mother    HIV Father    Prior to Admission medications   Medication Sig Start Date End Date Taking? Authorizing Provider  acetaminophen (TYLENOL) 500 MG tablet Take 1,000 mg by mouth every 6 (six) hours as needed for mild pain.   Yes [provider]  BIKTARVY 50-200-25 MG TABS tablet Take 1 tablet by mouth daily. 09/05/21  Yes Carlisle Cater, PA-C  docusate sodium (COLACE) 100 MG capsule Take 1 capsule (100 mg total) by mouth every 12 (twelve)  hours. Patient taking differently: Take 100 mg by mouth daily as needed for mild constipation. 08/31/21  Yes Gwyneth Sprout, MD  eplerenone (INSPRA) 25 MG tablet Take 1 tablet (25 mg total) by mouth daily. 09/05/21  Yes Renne Crigler, PA-C  furosemide (LASIX) 40 MG tablet Take 1.5 tablets (60 mg total) by mouth daily. Patient taking differently: Take 60 mg by mouth daily as needed for fluid. 09/05/21 10/20/23 Yes Renne Crigler, PA-C  metoprolol succinate (TOPROL-XL) 50 MG 24 hr tablet Take 1  tablet (50 mg total) by mouth 2 (two) times daily. 09/05/21  Yes Renne Crigler, PA-C  hydrocortisone-pramoxine (PROCTOFOAM HC) rectal foam Place 1 applicator rectally 2 (two) times daily. 09/09/21   Dione Booze, MD  potassium chloride SA (KLOR-CON) 20 MEQ tablet Take 1 tablet (20 mEq total) by mouth daily at 6 (six) AM. Patient not taking: Reported on 09/09/2021 09/05/21   Renne Crigler, PA-C  rivaroxaban (XARELTO) 20 MG TABS tablet Take 1 tablet (20 mg total) by mouth daily with supper. Patient not taking: Reported on 09/19/2021 09/05/21   Renne Crigler, PA-C  Potassium Chloride ER 20 MEQ TBCR Take 20 mEq by mouth daily at 6 (six) AM. Patient not taking: No sig reported 08/07/21 09/05/21  Bobette Mo, MD   Physical Exam: Vitals:   09/19/21 1027 09/19/21 1202 09/19/21 1230 09/19/21 1300  BP: (!) 154/93 (!) 162/68 (!) 142/100 (!) 143/98  Pulse: 97 93 92 78  Resp: 16 18 16 16   Temp:      TempSrc:      SpO2: 100% 100% 98% 96%  Weight:      Height:       Constitutional: NAD, calm, comfortable Eyes: PERRL, lids and conjunctivae normal. ENMT: Mucous membranes are moist. Posterior pharynx clear of any exudate or lesions. Neck: normal, supple, no masses, no thyromegaly Respiratory: clear to auscultation bilaterally, no wheezing, no crackles. Normal respiratory effort. No accessory muscle use.  Cardiovascular: Regular rate and rhythm, no murmurs / rubs / gallops. No extremity edema. 2+ pedal pulses. No carotid bruits.  Abdomen: Obese, no distention.  Bowel sounds positive.  No tenderness, no masses palpated. No hepatosplenomegaly.   Musculoskeletal: no clubbing / cyanosis.Good ROM, no contractures. Normal muscle tone.  Skin: no rashes, lesions, ulcers  Neurologic: CN 2-12 grossly intact. Sensation intact, DTR normal. Strength 5/5 in all 4.  Psychiatric: Normal judgment and insight. Alert and oriented x 3. Normal mood.   Labs on Admission: I have personally reviewed following labs  and imaging studies  CBC: Recent Labs  Lab 09/14/21 0044 09/19/21 0100 09/19/21 0703 09/19/21 1240  WBC 7.0 7.3  --   --   HGB 13.2 14.1 11.4* 10.5*  HCT 40.9 42.8 35.9* 31.8*  MCV 99.8 98.8  --   --   PLT 304 351  --   --     Basic Metabolic Panel: Recent Labs  Lab 09/14/21 0044 09/19/21 0100  NA 136 139  K 3.2* 3.1*  CL 104 104  CO2 23 20*  GLUCOSE 96 124*  BUN 12 17  CREATININE 1.53* 1.35*  CALCIUM 8.5* 8.9  MG  --  2.1    GFR: Estimated Creatinine Clearance: 99.3 mL/min (A) (by C-G formula based on SCr of 1.35 mg/dL (H)).  Liver Function Tests: Recent Labs  Lab 09/14/21 0044 09/19/21 0100  AST 28 34  ALT 24 32  ALKPHOS 37* 44  BILITOT 0.7 0.5  PROT 7.2 8.6*  ALBUMIN 3.7 4.4    Urine  analysis:    Component Value Date/Time   COLORURINE YELLOW 09/19/2021 0100   APPEARANCEUR CLEAR 09/19/2021 0100   LABSPEC 1.015 09/19/2021 0100   PHURINE 6.0 09/19/2021 0100   GLUCOSEU NEGATIVE 09/19/2021 0100   HGBUR SMALL (A) 09/19/2021 0100   BILIRUBINUR NEGATIVE 09/19/2021 0100   KETONESUR NEGATIVE 09/19/2021 0100   PROTEINUR NEGATIVE 09/19/2021 0100   NITRITE NEGATIVE 09/19/2021 0100   LEUKOCYTESUR NEGATIVE 09/19/2021 0100    Radiological Exams on Admission: No results found.  EKG: Independently reviewed.  Vent. rate 121 BPM PR interval 148 ms QRS duration 100 ms QT/QTcB 332/471 ms P-R-T axes 50 61 19 Sinus tachycardia Probable left atrial enlargement RSR' in V1 or V2, probably normal variant Borderline T wave abnormalities  Assessment/Plan Principal Problem:   Acute blood loss anemia In the setting of:   Acute lower GI bleeding Observation/PCU. Clear liquid diet. N.p.o. after midnight. Monitor hematocrit and hemoglobin. Transfuse as needed. Pantoprazole IV every 24 hours. Gastroenterology input appreciated.  Active Problems:   Single subsegmental pulmonary embolism    without acute cor pulmonale (HCC) Anticoagulation has been  held. Further anticoagulation therapy questionable.    HIV (human immunodeficiency virus infection) ([place]) Continue daily Biktarvy tablet. Follow-up with infectious diseases as an outpatient.    H/O ETOH abuse Stated he drinks only every other day. CIWA protocol orders placed preemptively.    Hypokalemia Replacing.  Magnesium was supplemented.    Vitamin B1 deficiency No neurological symptoms. Received 1 mg of cyanocobalamin IM. Continue cyanocobalamin 1 mg p.o. daily.    DVT prophylaxis: SCDs. Code Status:   Full code. Family Communication:   Disposition Plan:   Patient is from:  Home.  Anticipated DC to:  Home.  Anticipated DC date:  09/21/2021.  Anticipated DC barriers: Clinical status.  Consults called:  Gastroenterology. Admission status:  Observation/PCU.    Severity of Illness:High severity after having an acute lower GI bleed with a 3 g of hemoglobin loss.  The patient will remain in the hospital for endoscopic studies with GI.  Reubin Milan MD [facility]  How to contact the [facility] Attending or Consulting provider [place] or covering provider during after hours [place], for this patient?   Check the care team in [facility] and look for a) attending/consulting [facility] provider listed and b) the [facility] team listed Log into [email or web address] and use [place]'s universal password to access. If you do not have the password, please contact the hospital operator. Locate the [facility] provider you are looking for under [facility] and page to a number that you can be directly reached. If you still have difficulty reaching the provider, please page the [facility] (Director on Call) for the Hospitalists listed on amion for assistance.  09/19/2021, 2:02 PM   This document was preparation Dragon voice recognition software may contain some unintended transcription errors.

## 2021-09-19 NOTE — ED Notes (Signed)
Provided pt w/labeled specimen cup for urine collection per MD order. ENMiles 

## 2021-09-19 NOTE — ED Notes (Signed)
PA at bedside.

## 2021-09-19 NOTE — ED Triage Notes (Signed)
Pt reports mid  abdominal pain and multiple episodes of vomiting beginning a few hours prior to arrival.

## 2021-09-19 NOTE — ED Provider Notes (Signed)
Emergency Medicine Provider Triage Evaluation Note  William Powers , a 40 y.o. male  was evaluated in triage.  Pt complains of vomiting, abdominal pain.  Reports several hours of vomiting and lower abdominal pain.  No fevers or chills.  Admits to drinking a pint of bourbon.  Denies any chest pain, shortness of breath.  Visibly intoxicated on arrival.  Per chart review, multiple ED visits for chronic abdominal pain, has had multiple CT scans for this.  Review of Systems  Positive: As above Negative: As above  Physical Exam  BP 103/66 (BP Location: Right Arm)   Pulse (!) 113   Temp 98.3 F (36.8 C) (Oral)   Resp 12   Ht 6\' 3"  (1.905 m)   Wt 117 kg   SpO2 96%   BMI 32.25 kg/m  Gen:   Awake, no distress   Resp:  Normal effort  MSK:   Moves extremities without difficulty  Other:  Abdomen soft, nontender no peritoneal signs  Medical Decision Making  Medically screening exam initiated at 12:40 AM.  Appropriate orders placed.  Hayashida was informed that the remainder of the evaluation will be completed by another provider, this initial triage assessment does not replace that evaluation, and the importance of remaining in the ED until their evaluation is complete.     Harrison Mons, PA-C 09/19/21 0042    14/01/22, MD 09/19/21 [phone]

## 2021-09-19 NOTE — ED Provider Notes (Signed)
Signout received on this 40 year old male who presented with abdominal pain and vomiting and was intoxicated.  Patient developed hypotension while in the emergency room and was fluid resuscitated with improvement in blood pressure.  Given patient has a history of PE on anticoagulation patient had a digital rectal exam with positive Hemoccult without apparent signs of bleeding.  Also per chart review shows patient has had positive Hemoccult test 10 days ago.  Patient's initial hemoglobin was 14 1.  At signout patient is awaiting a repeat H&H, and IV potassium repletion.   Physical Exam  BP 126/72   Pulse 79   Temp 98.7 F (37.1 C)   Resp 15   Ht 6\' 3"  (1.905 m)   Wt 117 kg   SpO2 100%   BMI 32.25 kg/m    ED Course/Procedures     Procedures  MDM  Patient's initial repeat hemoglobin was 11.4 and GI was consulted.  Patient vital signs remained stable and he is without signs or symptoms of acute GI bleed.  GI is following and we are awaiting plan from their standpoint.  Patient is currently on clear liquid diet.  Patient while waiting for GI became frustrated and wanted to leave AMA.  Patient was agreeable to repeat H&H which resulted at 10.5.  Discussion had with patient and given downtrending hemoglobin patient is agreeable for admission and further GI work-up.  Discussed with hospitalist who will evaluate patient for admission.       , PA-C 09/19/21 1340    14/01/22, MD 09/20/21 1125
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
## 2021-09-19 NOTE — ED Notes (Signed)
PA approved clear liq diet. Pt received ginger ale and beef broth.

## 2021-09-20 SURGERY — COLONOSCOPY WITH PROPOFOL
Anesthesia: Monitor Anesthesia Care

## 2021-09-24 ENCOUNTER — Other Ambulatory Visit: Payer: Self-pay

## 2021-09-24 ENCOUNTER — Emergency Department (HOSPITAL_COMMUNITY)
Admission: EM | Admit: 2021-09-24 | Discharge: 2021-09-24 | Disposition: A | Discharge status: Home / Self Care | Payer: Medicaid Other | Source: Home / Self Care | Attending: Emergency Medicine | Admitting: Emergency Medicine

## 2021-09-24 ENCOUNTER — Other Ambulatory Visit (HOSPITAL_COMMUNITY): Payer: Self-pay

## 2021-09-24 ENCOUNTER — Emergency Department (HOSPITAL_COMMUNITY)
Admission: EM | Admit: 2021-09-24 | Discharge: 2021-09-24 | Disposition: A | Discharge status: Home / Self Care | Payer: Medicaid Other | Attending: Emergency Medicine | Admitting: Emergency Medicine

## 2021-09-24 ENCOUNTER — Encounter (HOSPITAL_COMMUNITY): Payer: Self-pay

## 2021-09-24 ENCOUNTER — Encounter (HOSPITAL_COMMUNITY): Payer: Self-pay | Admitting: Emergency Medicine

## 2021-09-24 DIAGNOSIS — I110 Hypertensive heart disease with heart failure: Secondary | ICD-10-CM | POA: Insufficient documentation

## 2021-09-24 DIAGNOSIS — K922 Gastrointestinal hemorrhage, unspecified: Secondary | ICD-10-CM | POA: Diagnosis not present

## 2021-09-24 DIAGNOSIS — Z7901 Long term (current) use of anticoagulants: Secondary | ICD-10-CM | POA: Insufficient documentation

## 2021-09-24 DIAGNOSIS — H43393 Other vitreous opacities, bilateral: Secondary | ICD-10-CM | POA: Diagnosis not present

## 2021-09-24 DIAGNOSIS — Z21 Asymptomatic human immunodeficiency virus [HIV] infection status: Secondary | ICD-10-CM | POA: Insufficient documentation

## 2021-09-24 DIAGNOSIS — F17210 Nicotine dependence, cigarettes, uncomplicated: Secondary | ICD-10-CM | POA: Insufficient documentation

## 2021-09-24 DIAGNOSIS — R531 Weakness: Secondary | ICD-10-CM | POA: Diagnosis present

## 2021-09-24 DIAGNOSIS — R5383 Other fatigue: Secondary | ICD-10-CM | POA: Insufficient documentation

## 2021-09-24 DIAGNOSIS — Z5321 Procedure and treatment not carried out due to patient leaving prior to being seen by health care provider: Secondary | ICD-10-CM | POA: Insufficient documentation

## 2021-09-24 DIAGNOSIS — Z20822 Contact with and (suspected) exposure to covid-19: Secondary | ICD-10-CM | POA: Diagnosis not present

## 2021-09-24 DIAGNOSIS — I509 Heart failure, unspecified: Secondary | ICD-10-CM | POA: Insufficient documentation

## 2021-09-24 LAB — CBC WITH DIFFERENTIAL/PLATELET
Abs Immature Granulocytes: 0.01 10*3/uL (ref 0.00–0.07)
Basophils Absolute: 0.1 10*3/uL (ref 0.0–0.1)
Basophils Relative: 1 %
Eosinophils Absolute: 0.2 10*3/uL (ref 0.0–0.5)
Eosinophils Relative: 2 %
HCT: 42.3 % (ref 39.0–52.0)
Hemoglobin: 13.6 g/dL (ref 13.0–17.0)
Immature Granulocytes: 0 %
Lymphocytes Relative: 23 %
Lymphs Abs: 1.8 10*3/uL (ref 0.7–4.0)
MCH: 32 pg (ref 26.0–34.0)
MCHC: 32.2 g/dL (ref 30.0–36.0)
MCV: 99.5 fL (ref 80.0–100.0)
Monocytes Absolute: 0.7 10*3/uL (ref 0.1–1.0)
Monocytes Relative: 9 %
Neutro Abs: 5.1 10*3/uL (ref 1.7–7.7)
Neutrophils Relative %: 65 %
Platelets: 324 10*3/uL (ref 150–400)
RBC: 4.25 MIL/uL (ref 4.22–5.81)
RDW: 13.4 % (ref 11.5–15.5)
WBC: 7.8 10*3/uL (ref 4.0–10.5)
nRBC: 0 % (ref 0.0–0.2)

## 2021-09-24 LAB — COMPREHENSIVE METABOLIC PANEL
ALT: 28 U/L (ref 0–44)
AST: 36 U/L (ref 15–41)
Albumin: 3.8 g/dL (ref 3.5–5.0)
Alkaline Phosphatase: 54 U/L (ref 38–126)
Anion gap: 10 (ref 5–15)
BUN: 9 mg/dL (ref 6–20)
CO2: 20 mmol/L — ABNORMAL LOW (ref 22–32)
Calcium: 9.1 mg/dL (ref 8.9–10.3)
Chloride: 103 mmol/L (ref 98–111)
Creatinine, Ser: 1.25 mg/dL — ABNORMAL HIGH (ref 0.61–1.24)
GFR, Estimated: >60 mL/min (ref >60)
Glucose, Bld: 135 mg/dL — ABNORMAL HIGH (ref 70–99)
Potassium: 3.1 mmol/L — ABNORMAL LOW (ref 3.5–5.1)
Sodium: 133 mmol/L — ABNORMAL LOW (ref 135–145)
Total Bilirubin: 0.6 mg/dL (ref 0.3–1.2)
Total Protein: 7.7 g/dL (ref 6.5–8.1)

## 2021-09-24 LAB — RESP PANEL BY RT-PCR (FLU A&B, COVID) ARPGX2
Influenza A by PCR: NEGATIVE
Influenza B by PCR: NEGATIVE
SARS Coronavirus 2 by RT PCR: NEGATIVE

## 2021-09-24 MED ORDER — BICTEGRAVIR-EMTRICITAB-TENOFOV 50-200-25 MG PO TABS
1.0000 | ORAL_TABLET | Freq: Every day | ORAL | Status: DC
  Administered 2021-09-24: 1 via ORAL
  Filled 2021-09-24: qty 1

## 2021-09-24 MED ORDER — METOPROLOL SUCCINATE ER 50 MG PO TB24
50.0000 mg | ORAL_TABLET | Freq: Two times a day (BID) | ORAL | 0 refills | Status: DC
  Filled 2021-09-24 (×2): qty 60, 30d supply, fill #0

## 2021-09-24 MED ORDER — BIKTARVY 50-200-25 MG PO TABS
1.0000 | ORAL_TABLET | Freq: Every day | ORAL | 0 refills | Status: DC
Start: 2021-09-24 — End: 2021-11-18
  Filled 2021-09-24 (×2): qty 30, 30d supply, fill #0

## 2021-09-24 MED ORDER — PROCTOFOAM HC 1-1 % EX FOAM
1.0000 | Freq: Two times a day (BID) | CUTANEOUS | 0 refills | Status: DC
Start: 2021-09-24 — End: 2022-02-25
  Filled 2021-09-24: qty 10, 19d supply, fill #0

## 2021-09-24 MED ORDER — RIVAROXABAN 20 MG PO TABS
20.0000 mg | ORAL_TABLET | Freq: Every day | ORAL | 0 refills | Status: DC
Start: 2021-09-24 — End: 2022-02-25
  Filled 2021-09-24: qty 30, 30d supply, fill #0

## 2021-09-24 MED ORDER — SPIRONOLACTONE 25 MG PO TABS
25.0000 mg | ORAL_TABLET | Freq: Every day | ORAL | Status: DC
  Filled 2021-09-24: qty 1

## 2021-09-24 MED ORDER — EPLERENONE 25 MG PO TABS
25.0000 mg | ORAL_TABLET | Freq: Every day | ORAL | 0 refills | Status: DC
Start: 2021-09-24 — End: 2022-02-25
  Filled 2021-09-24 – 2021-11-18 (×3): qty 30, 30d supply, fill #0

## 2021-09-24 MED ORDER — FUROSEMIDE 40 MG PO TABS
60.0000 mg | ORAL_TABLET | Freq: Every day | ORAL | 0 refills | Status: DC
Start: 2021-09-24 — End: 2022-02-25
  Filled 2021-09-24: qty 45, 30d supply, fill #0

## 2021-09-24 MED ORDER — METOPROLOL SUCCINATE ER 50 MG PO TB24
50.0000 mg | ORAL_TABLET | Freq: Once | ORAL | Status: AC
  Administered 2021-09-24: 50 mg via ORAL
  Filled 2021-09-24: qty 1

## 2021-09-24 NOTE — ED Triage Notes (Signed)
Pt presents to ED POV. Pt c/o spots in his eyes. Pt reports that he was recently here for GI bleed. Pt reports those s/s are improving but he still feels weak and is having the spots. Pt also out of home med rx. Requesting to get doses here.

## 2021-09-24 NOTE — ED Provider Notes (Signed)
Emergency Medicine Provider Triage Evaluation Note  William Powers , a 40 y.o. male  was evaluated in triage.  Pt complains of weakness since hospital admission. No alleviating/aggravating factors to his weakness, states it has to do with his hgb usually. Also reports vision changes described as seeing purple spots transiently over the past 24 hours in both eyes.   Review of Systems  Positive: Weakness, vision changes Negative: Loss of vision, numbness, unilateral weakness, syncope  Physical Exam  BP (!) 138/97 (BP Location: Left Arm)   Pulse (!) 102   Temp 99 F (37.2 C) (Oral)   Resp 18   SpO2 98%  Gen:   Awake, no distress   Resp:  Normal effort  MSK:   Moves extremities without difficulty  Other:  PERRL. EOMI. Vision grossly intact.   Medical Decision Making  Medically screening exam initiated at 2:54 AM.  Appropriate orders placed.  William Powers was informed that the remainder of the evaluation will be completed by another provider, this initial triage assessment does not replace that evaluation, and the importance of remaining in the ED until their evaluation is complete.  Weakness.  Vision changes.    Cherly Anderson, PA-C 09/24/21 [id number]    [name], MD 09/24/21 [phone]
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
## 2021-09-24 NOTE — ED Provider Notes (Incomplete)
Emergency Medicine Provider Triage Evaluation Note  William Powers , a 40 y.o. male  was evaluated in triage.  Pt complains of weakness  Patient also reports seeing intermittent   Review of Systems  Positive: *** Negative: ***  Physical Exam  BP (!) 138/97 (BP Location: Left Arm)    Pulse (!) 102    Temp 99 F (37.2 C) (Oral)    Resp 18    SpO2 98%  Gen:   Awake, no distress  *** Resp:  Normal effort *** MSK:   Moves extremities without difficulty *** Other:  ***  Medical Decision Making  Medically screening exam initiated at 2:55 AM.  Appropriate orders placed.  William Powers was informed that the remainder of the evaluation will be completed by another provider, this initial triage assessment does not replace that evaluation, and the importance of remaining in the ED until their evaluation is complete.  ***
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
## 2021-09-24 NOTE — ED Notes (Signed)
Pt states "I dont wanna wait in this hallway anymore, I have been waiting for hours", pt informed provider will be over shortly. Pt states, "that's fine, they can see someone else. I will go get my globin levels checked somewhere else". Pt insist on level and asked to sign paper to sign himself out.

## 2021-09-25 ENCOUNTER — Other Ambulatory Visit: Payer: Self-pay

## 2021-09-25 DIAGNOSIS — Z79899 Other long term (current) drug therapy: Secondary | ICD-10-CM | POA: Insufficient documentation

## 2021-09-25 DIAGNOSIS — R0602 Shortness of breath: Secondary | ICD-10-CM | POA: Diagnosis present

## 2021-09-25 DIAGNOSIS — I509 Heart failure, unspecified: Secondary | ICD-10-CM | POA: Diagnosis not present

## 2021-09-25 DIAGNOSIS — F17210 Nicotine dependence, cigarettes, uncomplicated: Secondary | ICD-10-CM | POA: Diagnosis not present

## 2021-09-25 DIAGNOSIS — I110 Hypertensive heart disease with heart failure: Secondary | ICD-10-CM | POA: Insufficient documentation

## 2021-09-25 DIAGNOSIS — R5383 Other fatigue: Secondary | ICD-10-CM | POA: Insufficient documentation

## 2021-09-25 DIAGNOSIS — Z21 Asymptomatic human immunodeficiency virus [HIV] infection status: Secondary | ICD-10-CM | POA: Diagnosis not present

## 2021-09-26 ENCOUNTER — Emergency Department (HOSPITAL_COMMUNITY)
Admission: EM | Admit: 2021-09-26 | Discharge: 2021-09-26 | Disposition: A | Discharge status: Home / Self Care | Payer: Medicaid Other | Attending: Emergency Medicine | Admitting: Emergency Medicine

## 2021-09-26 ENCOUNTER — Emergency Department (HOSPITAL_COMMUNITY): Payer: Medicaid Other

## 2021-09-26 ENCOUNTER — Encounter (HOSPITAL_COMMUNITY): Payer: Self-pay

## 2021-09-26 DIAGNOSIS — R079 Chest pain, unspecified: Secondary | ICD-10-CM

## 2021-09-26 DIAGNOSIS — R0602 Shortness of breath: Secondary | ICD-10-CM

## 2021-09-26 LAB — CBC
HCT: 41.7 % (ref 39.0–52.0)
Hemoglobin: 14 g/dL (ref 13.0–17.0)
MCH: 32.6 pg (ref 26.0–34.0)
MCHC: 33.6 g/dL (ref 30.0–36.0)
MCV: 97 fL (ref 80.0–100.0)
Platelets: 329 10*3/uL (ref 150–400)
RBC: 4.3 MIL/uL (ref 4.22–5.81)
RDW: 13.3 % (ref 11.5–15.5)
WBC: 7.8 10*3/uL (ref 4.0–10.5)
nRBC: 0 % (ref 0.0–0.2)

## 2021-09-26 LAB — BASIC METABOLIC PANEL
Anion gap: 12 (ref 5–15)
BUN: 13 mg/dL (ref 6–20)
CO2: 23 mmol/L (ref 22–32)
Calcium: 9.1 mg/dL (ref 8.9–10.3)
Chloride: 104 mmol/L (ref 98–111)
Creatinine, Ser: 1.3 mg/dL — ABNORMAL HIGH (ref 0.61–1.24)
GFR, Estimated: >60 mL/min (ref >60)
Glucose, Bld: 101 mg/dL — ABNORMAL HIGH (ref 70–99)
Potassium: 3.4 mmol/L — ABNORMAL LOW (ref 3.5–5.1)
Sodium: 139 mmol/L (ref 135–145)

## 2021-09-26 LAB — BRAIN NATRIURETIC PEPTIDE: B Natriuretic Peptide: 9.1 pg/mL (ref 0.0–100.0)

## 2021-09-26 LAB — TROPONIN I (HIGH SENSITIVITY)
Troponin I (High Sensitivity): 7 ng/L (ref <18)
Troponin I (High Sensitivity): 10 ng/L (ref <18)

## 2021-09-26 MED ORDER — METOPROLOL SUCCINATE ER 25 MG PO TB24
50.0000 mg | ORAL_TABLET | Freq: Every day | ORAL | Status: DC
  Administered 2021-09-26: 50 mg via ORAL
  Filled 2021-09-26: qty 2

## 2021-09-26 MED ORDER — BICTEGRAVIR-EMTRICITAB-TENOFOV 50-200-25 MG PO TABS
1.0000 | ORAL_TABLET | Freq: Every day | ORAL | Status: DC
  Administered 2021-09-26: 1 via ORAL
  Filled 2021-09-26: qty 1

## 2021-09-26 MED ORDER — RIVAROXABAN 15 MG PO TABS: 15.0000 mg | ORAL_TABLET | Freq: Once | ORAL | Status: DC

## 2021-09-26 MED ORDER — RIVAROXABAN 20 MG PO TABS
20.0000 mg | ORAL_TABLET | Freq: Every day | ORAL | Status: DC
  Filled 2021-09-26: qty 1

## 2021-09-26 NOTE — Discharge Instructions (Signed)
Please follow-up with your primary cardiologist and infectious disease doctors.  Please take your medicine as previously prescribed.  Come back to ER if you develop any chest pain, difficulty breathing or other new concerning symptom.
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
## 2021-09-27 ENCOUNTER — Other Ambulatory Visit (HOSPITAL_COMMUNITY): Payer: Self-pay

## 2021-09-30 ENCOUNTER — Other Ambulatory Visit (HOSPITAL_COMMUNITY): Payer: Self-pay

## 2021-10-03 ENCOUNTER — Emergency Department (HOSPITAL_COMMUNITY)
Admission: EM | Admit: 2021-10-03 | Discharge: 2021-10-04 | Disposition: A | Discharge status: Home / Self Care | Payer: Medicaid Other | Attending: Emergency Medicine | Admitting: Emergency Medicine

## 2021-10-03 ENCOUNTER — Other Ambulatory Visit (HOSPITAL_COMMUNITY): Payer: Self-pay

## 2021-10-03 DIAGNOSIS — F17210 Nicotine dependence, cigarettes, uncomplicated: Secondary | ICD-10-CM | POA: Diagnosis not present

## 2021-10-03 DIAGNOSIS — I110 Hypertensive heart disease with heart failure: Secondary | ICD-10-CM | POA: Diagnosis not present

## 2021-10-03 DIAGNOSIS — I509 Heart failure, unspecified: Secondary | ICD-10-CM | POA: Diagnosis not present

## 2021-10-03 DIAGNOSIS — F1010 Alcohol abuse, uncomplicated: Secondary | ICD-10-CM

## 2021-10-03 DIAGNOSIS — R112 Nausea with vomiting, unspecified: Secondary | ICD-10-CM | POA: Diagnosis not present

## 2021-10-03 DIAGNOSIS — F10929 Alcohol use, unspecified with intoxication, unspecified: Secondary | ICD-10-CM | POA: Diagnosis not present

## 2021-10-03 DIAGNOSIS — Z21 Asymptomatic human immunodeficiency virus [HIV] infection status: Secondary | ICD-10-CM | POA: Diagnosis not present

## 2021-10-04 NOTE — ED Notes (Signed)
Pt currently throwing up. Will hold off on re-obtaining BP at this time.

## 2021-10-04 NOTE — ED Provider Notes (Signed)
°  Provider Note MRN:  [id number]  Arrival date & time: 10/04/21    ED Course and Medical Decision Making  Assumed care from Dr Pilar Plate at shift change.  See not from prior team for complete details, in brief: pt with recent alcohol intoxication, emesis. He does not appear to be in acute withdrawal, no head trauma. Per prior physician plan to re-assess once clinically sober and plan for discharge home.   Patient appears clinically sober, he is ambulatory with a steady gait.  Reports feeling better.  He is calling a ride to come pick him up. Emesis has resolved.  Given outpatient resources regarding alcohol abuse.  The patient improved significantly and was discharged in stable condition. Detailed discussions were had with the patient regarding current findings, and need for close f/u with PCP or on call doctor. The patient has been instructed to return immediately if the symptoms worsen in any way for re-evaluation. Patient verbalized understanding and is in agreement with current care plan. All questions answered prior to discharge.    Procedures  Final Clinical Impressions(s) / ED Diagnoses     ICD-10-CM   1. Alcoholic intoxication with complication (HCC)  F10.929     2. Alcohol abuse  F10.10       ED Discharge Orders     None       Discharge Instructions   None         Sloan Leiter, DO 10/04/21 [id number]

## 2021-10-04 NOTE — ED Triage Notes (Signed)
Patient has hx of CHF lungs clear no SOB per EMS

## 2021-10-04 NOTE — ED Provider Notes (Signed)
[facility]-EMERGENCY DEPT [facility] Emergency Department Provider Note MRN:  [id number]  Arrival date & time: 10/04/21     Chief Complaint   Emesis (ETOH)   History of Present Illness   William Powers is a 40 y.o. year-old male with a history of HIV, polysubstance abuse, PE presenting to the ED with chief complaint of emesis.  Patient admits to drinking heavily this evening.  Experiencing nausea and vomiting.  I was unable to obtain an accurate HPI, PMH, or ROS due to the patient's intoxication.  Level 5 caveat.  Review of Systems  Positive for intoxication, nausea vomiting.  Patient's Health History    Past Medical History:  Diagnosis Date   Heart failure (HCC)    HFrEF (heart failure with reduced ejection fraction) (HCC)    15% EF as of 2019   HIV (human immunodeficiency virus infection) (HCC)    Hypertension    Polysubstance abuse (HCC)    Pulmonary embolism (HCC)     Past Surgical History:  Procedure Laterality Date   HERNIA REPAIR      Family History  Problem Relation Age of Onset   Lupus Mother    HIV Mother    HIV Father     Social History   Socioeconomic History   Marital status: Divorced    Spouse name: Not on file   Number of children: Not on file   Years of education: Not on file   Highest education level: Not on file  Occupational History   Not on file  Tobacco Use   Smoking status: Some Days    Types: Cigarettes   Smokeless tobacco: Never  Vaping Use   Vaping Use: Never used  Substance and Sexual Activity   Alcohol use: Yes   Drug use: Not Currently    Types: Marijuana   Sexual activity: Not on file  Other Topics Concern   Not on file  Social History Narrative   Not on file   Social Determinants of Health   Financial Resource Strain: Not on file  Food Insecurity: Not on file  Transportation Needs: Not on file  Physical Activity: Not on file  Stress: Not on file  Social Connections: Not on file  Intimate Partner Violence:  Not on file     Physical Exam   Vitals:   10/04/21 0530 10/04/21 0600  BP: 106/79 99/85  Pulse: 100 80  Resp: 18 16  Temp:    SpO2: 94% 94%    CONSTITUTIONAL: Chronically ill-appearing, NAD NEURO: Somnolent, wakes to painful stimuli, follows commands EYES:  eyes equal and reactive ENT/NECK:  no LAD, no JVD CARDIO: Regular rate, well-perfused, normal S1 and S2 PULM:  CTAB no wheezing or rhonchi GI/GU:  normal bowel sounds, non-distended, non-tender MSK/SPINE:  No gross deformities, no edema SKIN:  no rash, atraumatic PSYCH:  Appropriate speech and behavior  *Additional and/or pertinent findings included in MDM below  Diagnostic and Interventional Summary    EKG Interpretation  Date/Time:    Ventricular Rate:    PR Interval:    QRS Duration:   QT Interval:    QTC Calculation:   R Axis:     Text Interpretation:         Labs Reviewed - No data to display  No orders to display    Medications - No data to display   Procedures  /  Critical Care Procedures  ED Course and Medical Decision Making  I have reviewed the triage vital signs, the nursing  notes, and pertinent available records from the EMR.  Listed above are laboratory and imaging tests that I personally ordered, reviewed, and interpreted and then considered in my medical decision making (see below for details).  Alcohol intoxication with some vomiting.  No signs of head trauma, abdomen soft and nontender, will monitor closely.     615 update: Patient is no longer vomiting but remains clinically intoxicated, will need further metabolization and reassessment.  Signed out to oncoming provider at shift change.  Elmer Sow. Pilar Plate, MD [facility] Emergency Medicine [facility] [email or web address]  Final Clinical Impressions(s) / ED Diagnoses     ICD-10-CM   1. Alcoholic intoxication with complication [facility])  Q75.916       ED Discharge Orders     None        Discharge Instructions  Discussed with and Provided to Patient:   Discharge Instructions   None       Sabas Sous, MD 10/04/21 [phone]

## 2021-10-04 NOTE — ED Notes (Signed)
Pt ambulatory with steady gait 

## 2021-10-04 NOTE — ED Triage Notes (Signed)
Patient arrived via EMS d/t ETOH and emesis.

## 2021-10-04 NOTE — ED Triage Notes (Signed)
Per EMS, he refused to be stuck and medicated.

## 2021-10-05 ENCOUNTER — Other Ambulatory Visit (HOSPITAL_COMMUNITY): Payer: Self-pay

## 2021-10-25 ENCOUNTER — Other Ambulatory Visit (HOSPITAL_COMMUNITY): Payer: Self-pay

## 2021-10-28 ENCOUNTER — Other Ambulatory Visit (HOSPITAL_COMMUNITY): Payer: Self-pay

## 2021-10-31 ENCOUNTER — Other Ambulatory Visit (HOSPITAL_COMMUNITY): Payer: Self-pay

## 2021-11-05 ENCOUNTER — Emergency Department (HOSPITAL_COMMUNITY): Payer: Medicaid Other

## 2021-11-05 ENCOUNTER — Emergency Department (HOSPITAL_COMMUNITY)
Admission: EM | Admit: 2021-11-05 | Discharge: 2021-11-05 | Disposition: A | Discharge status: Home / Self Care | Payer: Medicaid Other | Attending: Emergency Medicine | Admitting: Emergency Medicine

## 2021-11-05 DIAGNOSIS — R079 Chest pain, unspecified: Secondary | ICD-10-CM | POA: Insufficient documentation

## 2021-11-05 DIAGNOSIS — R110 Nausea: Secondary | ICD-10-CM | POA: Insufficient documentation

## 2021-11-05 DIAGNOSIS — R0602 Shortness of breath: Secondary | ICD-10-CM | POA: Insufficient documentation

## 2021-11-05 DIAGNOSIS — Z5321 Procedure and treatment not carried out due to patient leaving prior to being seen by health care provider: Secondary | ICD-10-CM | POA: Diagnosis not present

## 2021-11-05 LAB — COMPREHENSIVE METABOLIC PANEL
ALT: 30 U/L (ref 0–44)
AST: 34 U/L (ref 15–41)
Albumin: 4.1 g/dL (ref 3.5–5.0)
Alkaline Phosphatase: 50 U/L (ref 38–126)
Anion gap: 9 (ref 5–15)
BUN: 10 mg/dL (ref 6–20)
CO2: 20 mmol/L — ABNORMAL LOW (ref 22–32)
Calcium: 8.8 mg/dL — ABNORMAL LOW (ref 8.9–10.3)
Chloride: 107 mmol/L (ref 98–111)
Creatinine, Ser: 1.16 mg/dL (ref 0.61–1.24)
GFR, Estimated: >60 mL/min (ref >60)
Glucose, Bld: 114 mg/dL — ABNORMAL HIGH (ref 70–99)
Potassium: 3.4 mmol/L — ABNORMAL LOW (ref 3.5–5.1)
Sodium: 136 mmol/L (ref 135–145)
Total Bilirubin: 0.7 mg/dL (ref 0.3–1.2)
Total Protein: 7.9 g/dL (ref 6.5–8.1)

## 2021-11-05 LAB — CBC WITH DIFFERENTIAL/PLATELET
Abs Immature Granulocytes: 0.03 10*3/uL (ref 0.00–0.07)
Basophils Absolute: 0 10*3/uL (ref 0.0–0.1)
Basophils Relative: 1 %
Eosinophils Absolute: 0.1 10*3/uL (ref 0.0–0.5)
Eosinophils Relative: 1 %
HCT: 45.5 % (ref 39.0–52.0)
Hemoglobin: 15.1 g/dL (ref 13.0–17.0)
Immature Granulocytes: 0 %
Lymphocytes Relative: 21 %
Lymphs Abs: 1.6 10*3/uL (ref 0.7–4.0)
MCH: 31.8 pg (ref 26.0–34.0)
MCHC: 33.2 g/dL (ref 30.0–36.0)
MCV: 95.8 fL (ref 80.0–100.0)
Monocytes Absolute: 0.6 10*3/uL (ref 0.1–1.0)
Monocytes Relative: 8 %
Neutro Abs: 5.4 10*3/uL (ref 1.7–7.7)
Neutrophils Relative %: 69 %
Platelets: 316 10*3/uL (ref 150–400)
RBC: 4.75 MIL/uL (ref 4.22–5.81)
RDW: 12.9 % (ref 11.5–15.5)
WBC: 7.9 10*3/uL (ref 4.0–10.5)
nRBC: 0 % (ref 0.0–0.2)

## 2021-11-05 LAB — LIPASE, BLOOD: Lipase: 32 U/L (ref 11–51)

## 2021-11-05 LAB — TROPONIN I (HIGH SENSITIVITY)
Troponin I (High Sensitivity): 5 ng/L (ref <18)
Troponin I (High Sensitivity): 5 ng/L (ref <18)

## 2021-11-05 MED ORDER — ONDANSETRON 4 MG PO TBDP
4.0000 mg | ORAL_TABLET | Freq: Once | ORAL | Status: AC
Start: 2021-11-05 — End: 2021-11-05
  Administered 2021-11-05: 4 mg via ORAL
  Filled 2021-11-05: qty 1

## 2021-11-05 NOTE — ED Notes (Signed)
Called pt, no response. 

## 2021-11-05 NOTE — ED Notes (Signed)
Pt called for room. No response. ?

## 2021-11-05 NOTE — ED Notes (Addendum)
Pt called for room. No response. ?

## 2021-11-05 NOTE — ED Triage Notes (Signed)
Patient BIBEMS for CP, nausea, and SOB after eating chinese food.    EMS vitals: BP:138/98 HR:80 SPO2:98

## 2021-11-05 NOTE — ED Notes (Signed)
No answer for treatment room. 

## 2021-11-05 NOTE — ED Provider Triage Note (Signed)
Emergency Medicine Provider Triage Evaluation Note  William Powers , a 41 y.o. male  was evaluated in triage.  Pt complains of chest pain that began around 20:30 tonight. Central, difficult to described, associated nausea, upset stomach and sensation of being overheated. No alleviating/aggravating factors.   Review of Systems  Positive: Nausea, chest pain Negative: Syncope, hemoptysis, vomiting, diarrhea  Physical Exam  BP (!) 130/96    Pulse 81    Temp 98.2 F (36.8 C) (Oral)    Resp 16    SpO2 97%  Gen:   Awake, no distress   Resp:  Normal effort  MSK:   Moves extremities without difficulty  Other:  No peritoneal signs on abdominal exam.   Medical Decision Making  Medically screening exam initiated at 1:43 AM.  Appropriate orders placed.  William Powers was informed that the remainder of the evaluation will be completed by another provider, this initial triage assessment does not replace that evaluation, and the importance of remaining in the ED until their evaluation is complete.  Chest pain   Cherly Anderson, PA-C 11/05/21 0145

## 2021-11-05 NOTE — ED Notes (Signed)
Patient has been called 4 times before 1100 am no answer

## 2021-11-11 ENCOUNTER — Emergency Department (HOSPITAL_COMMUNITY)
Admission: EM | Admit: 2021-11-11 | Discharge: 2021-11-12 | Discharge status: Against Medical Advice | Payer: Medicaid Other | Attending: Physician Assistant | Admitting: Physician Assistant

## 2021-11-11 DIAGNOSIS — Z5321 Procedure and treatment not carried out due to patient leaving prior to being seen by health care provider: Secondary | ICD-10-CM | POA: Insufficient documentation

## 2021-11-11 DIAGNOSIS — R319 Hematuria, unspecified: Secondary | ICD-10-CM | POA: Diagnosis not present

## 2021-11-11 DIAGNOSIS — R300 Dysuria: Secondary | ICD-10-CM | POA: Insufficient documentation

## 2021-11-11 DIAGNOSIS — I509 Heart failure, unspecified: Secondary | ICD-10-CM | POA: Insufficient documentation

## 2021-11-11 DIAGNOSIS — Z21 Asymptomatic human immunodeficiency virus [HIV] infection status: Secondary | ICD-10-CM | POA: Insufficient documentation

## 2021-11-11 DIAGNOSIS — K921 Melena: Secondary | ICD-10-CM | POA: Insufficient documentation

## 2021-11-11 NOTE — ED Provider Triage Note (Signed)
Emergency Medicine Provider Triage Evaluation Note  William Powers , Powers 41 y.o. male  was evaluated in triage.  Pt complains of hematuria, blood in stool.  History of HIV, heart failure, polysubstance use, PE and chronic coagulation.  States it hurts when he urinates, has noted some blood in urine as well as bright red blood in his stool.  Per chart review patient frequently seen at [facility] for similar complaints  Review of Systems  Positive: Hematuria, BRBPR, dysuria Negative:   Physical Exam  There were no vitals taken for this visit. Gen:   Awake, no distress   Resp:  Normal effort  MSK:   Moves extremities without difficulty  ABD:  Soft non tender Other:    Medical Decision Making  Medically screening exam initiated at 11:42 PM.  Appropriate orders placed.  William Powers was informed that the remainder of the evaluation will be completed by another provider, this initial triage assessment does not replace that evaluation, and the importance of remaining in the ED until their evaluation is complete.  Hematuria, BRBPR, dysuria   William Sherpa A, PA-C 11/11/21 2344

## 2021-11-12 ENCOUNTER — Encounter (HOSPITAL_COMMUNITY): Payer: Self-pay

## 2021-11-12 LAB — CBC WITH DIFFERENTIAL/PLATELET
Abs Immature Granulocytes: 0.03 10*3/uL (ref 0.00–0.07)
Basophils Absolute: 0.1 10*3/uL (ref 0.0–0.1)
Basophils Relative: 1 %
Eosinophils Absolute: 0.2 10*3/uL (ref 0.0–0.5)
Eosinophils Relative: 3 %
HCT: 43.8 % (ref 39.0–52.0)
Hemoglobin: 14.6 g/dL (ref 13.0–17.0)
Immature Granulocytes: 0 %
Lymphocytes Relative: 23 %
Lymphs Abs: 1.7 10*3/uL (ref 0.7–4.0)
MCH: 32 pg (ref 26.0–34.0)
MCHC: 33.3 g/dL (ref 30.0–36.0)
MCV: 96.1 fL (ref 80.0–100.0)
Monocytes Absolute: 0.9 10*3/uL (ref 0.1–1.0)
Monocytes Relative: 12 %
Neutro Abs: 4.5 10*3/uL (ref 1.7–7.7)
Neutrophils Relative %: 61 %
Platelets: 313 10*3/uL (ref 150–400)
RBC: 4.56 MIL/uL (ref 4.22–5.81)
RDW: 12.7 % (ref 11.5–15.5)
WBC: 7.4 10*3/uL (ref 4.0–10.5)
nRBC: 0 % (ref 0.0–0.2)

## 2021-11-12 LAB — COMPREHENSIVE METABOLIC PANEL
ALT: 32 U/L (ref 0–44)
AST: 34 U/L (ref 15–41)
Albumin: 4 g/dL (ref 3.5–5.0)
Alkaline Phosphatase: 51 U/L (ref 38–126)
Anion gap: 12 (ref 5–15)
BUN: 18 mg/dL (ref 6–20)
CO2: 20 mmol/L — ABNORMAL LOW (ref 22–32)
Calcium: 8.9 mg/dL (ref 8.9–10.3)
Chloride: 106 mmol/L (ref 98–111)
Creatinine, Ser: 1.3 mg/dL — ABNORMAL HIGH (ref 0.61–1.24)
GFR, Estimated: >60 mL/min (ref >60)
Glucose, Bld: 100 mg/dL — ABNORMAL HIGH (ref 70–99)
Potassium: 3.7 mmol/L (ref 3.5–5.1)
Sodium: 138 mmol/L (ref 135–145)
Total Bilirubin: 0.4 mg/dL (ref 0.3–1.2)
Total Protein: 7.7 g/dL (ref 6.5–8.1)

## 2021-11-12 LAB — LIPASE, BLOOD: Lipase: 45 U/L (ref 11–51)

## 2021-11-12 NOTE — ED Triage Notes (Signed)
Pt arrives POV for eval of reported hematuria and melena. Pt then aggressively listed multiple complaints and began screaming "fuck."

## 2021-11-17 ENCOUNTER — Other Ambulatory Visit: Payer: Self-pay

## 2021-11-17 ENCOUNTER — Emergency Department (HOSPITAL_COMMUNITY)
Admission: EM | Admit: 2021-11-17 | Discharge: 2021-11-17 | Disposition: A | Discharge status: Home / Self Care | Payer: Medicaid Other | Attending: Emergency Medicine | Admitting: Emergency Medicine

## 2021-11-17 ENCOUNTER — Emergency Department (HOSPITAL_COMMUNITY): Payer: Medicaid Other

## 2021-11-17 ENCOUNTER — Encounter (HOSPITAL_COMMUNITY): Payer: Self-pay

## 2021-11-17 DIAGNOSIS — Z86711 Personal history of pulmonary embolism: Secondary | ICD-10-CM | POA: Diagnosis not present

## 2021-11-17 DIAGNOSIS — Z7901 Long term (current) use of anticoagulants: Secondary | ICD-10-CM | POA: Diagnosis not present

## 2021-11-17 DIAGNOSIS — I509 Heart failure, unspecified: Secondary | ICD-10-CM | POA: Insufficient documentation

## 2021-11-17 DIAGNOSIS — Z20822 Contact with and (suspected) exposure to covid-19: Secondary | ICD-10-CM | POA: Insufficient documentation

## 2021-11-17 DIAGNOSIS — Z21 Asymptomatic human immunodeficiency virus [HIV] infection status: Secondary | ICD-10-CM | POA: Insufficient documentation

## 2021-11-17 DIAGNOSIS — R079 Chest pain, unspecified: Secondary | ICD-10-CM | POA: Insufficient documentation

## 2021-11-17 DIAGNOSIS — R509 Fever, unspecified: Secondary | ICD-10-CM | POA: Insufficient documentation

## 2021-11-17 DIAGNOSIS — R0602 Shortness of breath: Secondary | ICD-10-CM | POA: Diagnosis present

## 2021-11-17 LAB — CBC WITH DIFFERENTIAL/PLATELET
Abs Immature Granulocytes: 0.02 10*3/uL (ref 0.00–0.07)
Basophils Absolute: 0 10*3/uL (ref 0.0–0.1)
Basophils Relative: 0 %
Eosinophils Absolute: 0 10*3/uL (ref 0.0–0.5)
Eosinophils Relative: 1 %
HCT: 43.2 % (ref 39.0–52.0)
Hemoglobin: 14.4 g/dL (ref 13.0–17.0)
Immature Granulocytes: 1 %
Lymphocytes Relative: 37 %
Lymphs Abs: 1 10*3/uL (ref 0.7–4.0)
MCH: 32 pg (ref 26.0–34.0)
MCHC: 33.3 g/dL (ref 30.0–36.0)
MCV: 96 fL (ref 80.0–100.0)
Monocytes Absolute: 0.6 10*3/uL (ref 0.1–1.0)
Monocytes Relative: 22 %
Neutro Abs: 1.1 10*3/uL — ABNORMAL LOW (ref 1.7–7.7)
Neutrophils Relative %: 39 %
Platelet Morphology: NORMAL
Platelets: 271 10*3/uL (ref 150–400)
RBC: 4.5 MIL/uL (ref 4.22–5.81)
RDW: 13.2 % (ref 11.5–15.5)
WBC: 2.8 10*3/uL — ABNORMAL LOW (ref 4.0–10.5)
nRBC: 0 % (ref 0.0–0.2)

## 2021-11-17 LAB — RESP PANEL BY RT-PCR (FLU A&B, COVID) ARPGX2
Influenza A by PCR: NEGATIVE
Influenza B by PCR: NEGATIVE
SARS Coronavirus 2 by RT PCR: NEGATIVE

## 2021-11-17 LAB — BASIC METABOLIC PANEL
Anion gap: 10 (ref 5–15)
BUN: 16 mg/dL (ref 6–20)
CO2: 20 mmol/L — ABNORMAL LOW (ref 22–32)
Calcium: 8.5 mg/dL — ABNORMAL LOW (ref 8.9–10.3)
Chloride: 104 mmol/L (ref 98–111)
Creatinine, Ser: 1 mg/dL (ref 0.61–1.24)
GFR, Estimated: >60 mL/min (ref >60)
Glucose, Bld: 109 mg/dL — ABNORMAL HIGH (ref 70–99)
Potassium: 3.7 mmol/L (ref 3.5–5.1)
Sodium: 134 mmol/L — ABNORMAL LOW (ref 135–145)

## 2021-11-17 LAB — BRAIN NATRIURETIC PEPTIDE: B Natriuretic Peptide: 12.4 pg/mL (ref 0.0–100.0)

## 2021-11-17 LAB — TROPONIN I (HIGH SENSITIVITY): Troponin I (High Sensitivity): 5 ng/L

## 2021-11-17 NOTE — Discharge Instructions (Signed)
It was a pleasure taking care of you today. As discussed, all of yours labs were reassuring. Your chest x-ray did not show any abnormalities. Follow-up with PCP if symptoms do not improve. Return to the ER for new or worsening symptoms.

## 2021-11-17 NOTE — ED Provider Notes (Signed)
[facility]-EMERGENCY DEPT Provider Note   CSN: [id number] Arrival date & time: 11/17/21  4656     History  Chief Complaint  Patient presents with   Shortness of Breath    William Powers is a 41 y.o. male with a past medical history significant for HIV, CHF on Lasix, PE on chronic Xarelto which he has been compliant with who presents to the ED due to shortness of breath for the past 2 to 3 days.  Patient states his breathing "does not feel right".  He notes shortness of breath is worse with exertion. No history of COPD or asthma.  Also admits to some left-sided chest pain.  He has been compliant with his Lasix.  Denies lower extremity edema.  He also notes he has not missed any doses of his Xarelto.  No abdominal pain.  Denies nausea, vomiting, diarrhea.  He endorses a subjective fever yesterday however, never checked his temperature.  Denies cough, sore throat, nasal congestion.     Home Medications Prior to Admission medications   Medication Sig Start Date End Date Taking? Authorizing Provider  BIKTARVY 50-200-25 MG TABS tablet Take 1 tablet by mouth daily. 09/24/21   Melene Plan, DO  docusate sodium (COLACE) 100 MG capsule Take 1 capsule (100 mg total) by mouth every 12 (twelve) hours. Patient not taking: Reported on 09/26/2021 08/31/21   Gwyneth Sprout, MD  eplerenone (INSPRA) 25 MG tablet Take 1 tablet (25 mg total) by mouth daily. 09/24/21   Melene Plan, DO  furosemide (LASIX) 40 MG tablet Take 1 and 1/2 tablets by mouth daily. Patient taking differently: Take 60 mg by mouth daily as needed for fluid or edema. 09/24/21 10/24/21  Melene Plan, DO  hydrocortisone-pramoxine (PROCTOFOAM [facility]) rectal foam Place 1 applicatorful rectally 2 times daily. Patient not taking: Reported on 09/26/2021 09/24/21   Melene Plan, DO  metoprolol succinate (TOPROL-XL) 50 MG 24 hr tablet Take 1 tablet by mouth 2 times daily. Patient taking differently: Take 50 mg by mouth daily. 09/24/21    Melene Plan, DO  potassium chloride SA (KLOR-CON) 20 MEQ tablet Take 1 tablet (20 mEq total) by mouth daily at 6 (six) AM. Patient not taking: Reported on 09/09/2021 09/05/21   Renne Crigler, PA-C  rivaroxaban (XARELTO) 20 MG TABS tablet Take 1 tablet by mouth daily with supper. Patient not taking: Reported on 09/26/2021 09/24/21   Melene Plan, DO  Potassium Chloride ER 20 MEQ TBCR Take 20 mEq by mouth daily at 6 (six) AM. Patient not taking: No sig reported 08/07/21 09/05/21  Bobette Mo, MD      Allergies    Bee venom, Lisinopril, and Sulfamethoxazole-trimethoprim    Review of Systems   Review of Systems  Constitutional:  Positive for fever. Negative for chills.  Respiratory:  Positive for shortness of breath. Negative for cough.   Cardiovascular:  Positive for chest pain. Negative for leg swelling.  Gastrointestinal:  Negative for abdominal pain.   Physical Exam Updated Vital Signs BP 119/86    Pulse 76    Temp 98.1 F (36.7 C) (Oral)    Resp 16    Ht 6' 3.5" (1.918 m)    Wt 117 kg    SpO2 100%    BMI 31.82 kg/m  Physical Exam Vitals and nursing note reviewed.  Constitutional:      General: He is not in acute distress.    Appearance: He is not ill-appearing.  HENT:     Head: Normocephalic.  Eyes:     Pupils: Pupils are equal, round, and reactive to light.  Cardiovascular:     Rate and Rhythm: Normal rate and regular rhythm.     Pulses: Normal pulses.     Heart sounds: Normal heart sounds. No murmur heard.   No friction rub. No gallop.  Pulmonary:     Effort: Pulmonary effort is normal.     Breath sounds: Normal breath sounds.     Comments: Respirations equal and unlabored, patient able to speak in full sentences, lungs clear to auscultation bilaterally Abdominal:     General: Abdomen is flat. There is no distension.     Palpations: Abdomen is soft.     Tenderness: There is no abdominal tenderness. There is no guarding or rebound.  Musculoskeletal:         General: Normal range of motion.     Cervical back: Neck supple.  Skin:    General: Skin is warm and dry.  Neurological:     General: No focal deficit present.     Mental Status: He is alert.  Psychiatric:        Mood and Affect: Mood normal.        Behavior: Behavior normal.    ED Results / Procedures / Treatments   Labs (all labs ordered are listed, but only abnormal results are displayed) Labs Reviewed  CBC WITH DIFFERENTIAL/PLATELET - Abnormal; Notable for the following components:      Result Value   WBC 2.8 (*)    Neutro Abs 1.1 (*)    All other components within normal limits  BASIC METABOLIC PANEL - Abnormal; Notable for the following components:   Sodium 134 (*)    CO2 20 (*)    Glucose, Bld 109 (*)    Calcium 8.5 (*)    All other components within normal limits  RESP PANEL BY RT-PCR (FLU A&B, COVID) ARPGX2  BRAIN NATRIURETIC PEPTIDE  TROPONIN I (HIGH SENSITIVITY)    EKG EKG Interpretation  Date/Time:  Sunday November 17 2021 07:25:42 EST Ventricular Rate:  89 PR Interval:  152 QRS Duration: 92 QT Interval:  353 QTC Calculation: 430 R Axis:   57 Text Interpretation: Sinus rhythm Probable left atrial enlargement ST elev, probable normal early repol pattern Confirmed by Norman ClayHong, Joshua ([id number]) on 11/17/2021 10:56:10 AM  Radiology DG Chest 2 View  Result Date: 11/17/2021 CLINICAL DATA:  Shortness of breath for 2-3 days EXAM: CHEST - 2 VIEW COMPARISON:  November 05, 2021 FINDINGS: The heart size and mediastinal contours are within normal limits. No focal consolidation. No pleural effusion. No pneumothorax. The visualized skeletal structures are unremarkable. IMPRESSION: No active cardiopulmonary disease. Electronically Signed   By: Maudry MayhewJeffrey  Waltz M.D.   On: 11/17/2021 08:37    Procedures Procedures    Medications Ordered in ED Medications - No data to display  ED Course/ Medical Decision Making/ A&P                           Medical Decision Making Amount  and/or Complexity of Data Reviewed External Data Reviewed: notes. Labs: ordered. Decision-making details documented in ED Course. Radiology: ordered and independent interpretation performed. Decision-making details documented in ED Course. ECG/medicine tests: ordered and independent interpretation performed. Decision-making details documented in ED Course.  This patient presents to the ED for concern of SOB, this involves an extensive number of treatment options, and is a complaint that carries with it a high risk of  complications and morbidity.  The differential diagnosis includes CHF, PE, pneumonia, COVID, COPD  41 year old male presents to the ED due to shortness of breath.  He also endorses some left-sided chest pain.  History of CHF and history of PE on chronic Xarelto.  No COPD or asthma.  Upon arrival, patient afebrile, not tachycardic or hypoxic.  Patient also endorses a subjective fever last night however, did not check his temperature.  No cough, sore throat, or nasal congestion.  Patient in no acute distress.  Lungs clear to auscultation bilaterally.  No wheeze, rhonchi, or rales.  Very minimal lower extremity edema.  Routine labs ordered.  Chest x-ray to rule out pneumonia. No wheeze to suggest COPD. BNP to rule out CHF exacerbation.  Troponin to rule out ACS given some chest pain. Lower suspicion for PE given patient has been compliant with his Xarelto and no tachycardia or hypoxia.  CBC significant for leukopenia at 2.8.  COVID/influenza negative.  BNP normal.  Doubt CHF exacerbation.  Troponin normal.  EKG reviewed which demonstrates normal sinus rhythm. Low suspicion for ACS. Chest x-ray personally reviewed and interpreted which is negative for signs of pneumonia, pneumothorax, or widened mediastinum.  Agree with radiology interpretation. Unknown etiology of shortness of breath, but patient demonstrating no signs of respiratory distress. No wheeze, stridor, or rales. No evidence of fluid  overload on exam.   10:53 AM Informed by RN that patient has an emergency and needs to leave.  Patient ambulated in the room and maintain O2 saturation above 95% without difficulty.  Advised patient to follow-up with PCP if symptoms do not improve. Strict ED precautions discussed with patient. Patient states understanding and agrees to plan. Patient discharged home in no acute distress and stable vitals         Final Clinical Impression(s) / ED Diagnoses Final diagnoses:  Shortness of breath    Rx / DC Orders ED Discharge Orders     None         Jesusita Oka 11/17/21 1058    [address], MD 11/20/21 [phone]

## 2021-11-17 NOTE — ED Triage Notes (Signed)
Pt endorses SHOB for about 2-3 days. Pt reports recently running out of metoprolol and is worried that this could be related. Pt reports still taking his furosemide. Denies chest pain, back pain, abdominal pain, and N/V/D.

## 2021-11-17 NOTE — ED Notes (Signed)
Patient refused IV placement.

## 2021-11-17 NOTE — ED Notes (Signed)
Pt ambulatory to waiting room. Pt verbalized understanding of discharge instructions.   

## 2021-11-17 NOTE — ED Notes (Signed)
Pt ambulated with pulse ox. Pts O2 remained about 96%. Pt did not complain of any SHOB.

## 2021-11-18 ENCOUNTER — Other Ambulatory Visit (HOSPITAL_COMMUNITY): Payer: Self-pay | Admitting: Emergency Medicine

## 2021-11-18 ENCOUNTER — Other Ambulatory Visit (HOSPITAL_COMMUNITY): Payer: Self-pay

## 2021-11-18 MED ORDER — BIKTARVY 50-200-25 MG PO TABS
1.0000 | ORAL_TABLET | Freq: Every day | ORAL | 0 refills | Status: DC
  Filled 2021-11-18 – 2021-11-29 (×2): qty 30, 30d supply, fill #0

## 2021-11-18 MED ORDER — METOPROLOL SUCCINATE ER 50 MG PO TB24
50.0000 mg | ORAL_TABLET | Freq: Two times a day (BID) | ORAL | 0 refills | Status: DC
  Filled 2021-11-18 – 2021-11-29 (×3): qty 60, 30d supply, fill #0

## 2021-11-26 ENCOUNTER — Other Ambulatory Visit (HOSPITAL_COMMUNITY): Payer: Self-pay

## 2021-11-27 ENCOUNTER — Other Ambulatory Visit (HOSPITAL_COMMUNITY): Payer: Self-pay

## 2021-11-29 ENCOUNTER — Other Ambulatory Visit (HOSPITAL_COMMUNITY): Payer: Self-pay

## 2021-12-11 ENCOUNTER — Emergency Department (HOSPITAL_COMMUNITY)
Admission: EM | Admit: 2021-12-11 | Discharge: 2021-12-12 | Disposition: A | Discharge status: Home / Self Care | Payer: Medicaid Other | Attending: Emergency Medicine | Admitting: Emergency Medicine

## 2021-12-11 DIAGNOSIS — I110 Hypertensive heart disease with heart failure: Secondary | ICD-10-CM | POA: Insufficient documentation

## 2021-12-11 DIAGNOSIS — R1030 Lower abdominal pain, unspecified: Secondary | ICD-10-CM | POA: Insufficient documentation

## 2021-12-11 DIAGNOSIS — Z87891 Personal history of nicotine dependence: Secondary | ICD-10-CM | POA: Diagnosis not present

## 2021-12-11 DIAGNOSIS — R300 Dysuria: Secondary | ICD-10-CM | POA: Diagnosis present

## 2021-12-11 DIAGNOSIS — Z7901 Long term (current) use of anticoagulants: Secondary | ICD-10-CM | POA: Insufficient documentation

## 2021-12-11 DIAGNOSIS — R109 Unspecified abdominal pain: Secondary | ICD-10-CM | POA: Insufficient documentation

## 2021-12-11 DIAGNOSIS — Z79899 Other long term (current) drug therapy: Secondary | ICD-10-CM | POA: Diagnosis not present

## 2021-12-11 DIAGNOSIS — I509 Heart failure, unspecified: Secondary | ICD-10-CM | POA: Diagnosis not present

## 2021-12-11 DIAGNOSIS — Z21 Asymptomatic human immunodeficiency virus [HIV] infection status: Secondary | ICD-10-CM | POA: Insufficient documentation

## 2021-12-11 DIAGNOSIS — K625 Hemorrhage of anus and rectum: Secondary | ICD-10-CM | POA: Diagnosis not present

## 2021-12-11 NOTE — ED Triage Notes (Signed)
Patient c/o painful urinating x 3days . Pt concern he got UTI. Patiet stated he feel tingling sensation in his chest too. Pt denies N/V. Pt a/ox4.

## 2021-12-12 ENCOUNTER — Other Ambulatory Visit: Payer: Self-pay

## 2021-12-12 ENCOUNTER — Encounter (HOSPITAL_COMMUNITY): Payer: Self-pay | Admitting: Emergency Medicine

## 2021-12-12 DIAGNOSIS — K625 Hemorrhage of anus and rectum: Secondary | ICD-10-CM | POA: Insufficient documentation

## 2021-12-12 DIAGNOSIS — R1030 Lower abdominal pain, unspecified: Secondary | ICD-10-CM | POA: Insufficient documentation

## 2021-12-12 DIAGNOSIS — Z79899 Other long term (current) drug therapy: Secondary | ICD-10-CM | POA: Insufficient documentation

## 2021-12-12 DIAGNOSIS — Z7901 Long term (current) use of anticoagulants: Secondary | ICD-10-CM | POA: Insufficient documentation

## 2021-12-12 LAB — URINALYSIS, ROUTINE W REFLEX MICROSCOPIC
Bilirubin Urine: NEGATIVE
Glucose, UA: NEGATIVE mg/dL
Ketones, ur: 5 mg/dL — AB
Leukocytes,Ua: NEGATIVE
Nitrite: NEGATIVE
Protein, ur: NEGATIVE mg/dL
Specific Gravity, Urine: 1.021 (ref 1.005–1.030)
pH: 6 (ref 5.0–8.0)

## 2021-12-12 LAB — COMPREHENSIVE METABOLIC PANEL
ALT: 38 U/L (ref 0–44)
AST: 35 U/L (ref 15–41)
Albumin: 3.9 g/dL (ref 3.5–5.0)
Alkaline Phosphatase: 58 U/L (ref 38–126)
Anion gap: 8 (ref 5–15)
BUN: 16 mg/dL (ref 6–20)
CO2: 24 mmol/L (ref 22–32)
Calcium: 8.7 mg/dL — ABNORMAL LOW (ref 8.9–10.3)
Chloride: 107 mmol/L (ref 98–111)
Creatinine, Ser: 1.32 mg/dL — ABNORMAL HIGH (ref 0.61–1.24)
GFR, Estimated: >60 mL/min (ref >60)
Glucose, Bld: 96 mg/dL (ref 70–99)
Potassium: 3.1 mmol/L — ABNORMAL LOW (ref 3.5–5.1)
Sodium: 139 mmol/L (ref 135–145)
Total Bilirubin: 0.5 mg/dL (ref 0.3–1.2)
Total Protein: 7.6 g/dL (ref 6.5–8.1)

## 2021-12-12 LAB — CBC
HCT: 39.3 % (ref 39.0–52.0)
Hemoglobin: 13.5 g/dL (ref 13.0–17.0)
MCH: 32.3 pg (ref 26.0–34.0)
MCHC: 34.4 g/dL (ref 30.0–36.0)
MCV: 94 fL (ref 80.0–100.0)
Platelets: 312 10*3/uL (ref 150–400)
RBC: 4.18 MIL/uL — ABNORMAL LOW (ref 4.22–5.81)
RDW: 13 % (ref 11.5–15.5)
WBC: 8 10*3/uL (ref 4.0–10.5)
nRBC: 0 % (ref 0.0–0.2)

## 2021-12-12 LAB — ETHANOL: Alcohol, Ethyl (B): 178 mg/dL — ABNORMAL HIGH (ref <10)

## 2021-12-12 LAB — TROPONIN I (HIGH SENSITIVITY): Troponin I (High Sensitivity): 4 ng/L (ref <18)

## 2021-12-12 NOTE — Discharge Instructions (Signed)
You were evaluated in the Emergency Department and after careful evaluation, we did not find any emergent condition requiring admission or further testing in the hospital.  Your exam/testing today is overall reassuring.  Please return to the Emergency Department if you experience any worsening of your condition.   Thank you for allowing us to be a part of your care. 

## 2021-12-12 NOTE — ED Provider Notes (Signed)
[facility] Emergency Department Provider Note MRN:  [id number]  Arrival date & time: 12/12/21     Chief Complaint   Dysuria   History of Present Illness   William Powers is a 41 y.o. year-old male with a history of heart failure, polysubstance use disorder, HIV presenting to the ED with chief complaint of dysuria.  Patient endorsing dysuria in triage.  With me patient is somnolent, endorsing left side pain.  Review of Systems  I was unable to obtain a full/accurate HPI, PMH, or ROS due to the patient's altered mental status.  Patient's Health History    Past Medical History:  Diagnosis Date   Heart failure (HCC)    HFrEF (heart failure with reduced ejection fraction) (HCC)    15% EF as of 2019   HIV (human immunodeficiency virus infection) ([place])    Hypertension    Polysubstance abuse ([place])    Pulmonary embolism (HCC)     Past Surgical History:  Procedure Laterality Date   HERNIA REPAIR      Family History  Problem Relation Age of Onset   Lupus Mother    HIV Mother    HIV Father     Social History   Socioeconomic History   Marital status: Divorced    Spouse name: Not on file   Number of children: Not on file   Years of education: Not on file   Highest education level: Not on file  Occupational History   Not on file  Tobacco Use   Smoking status: Some Days    Types: Cigarettes   Smokeless tobacco: Never  Vaping Use   Vaping Use: Never used  Substance and Sexual Activity   Alcohol use: Yes   Drug use: Not Currently    Types: Marijuana   Sexual activity: Not on file  Other Topics Concern   Not on file  Social History Narrative   Not on file   Social Determinants of Health   Financial Resource Strain: Not on file  Food Insecurity: Not on file  Transportation Needs: Not on file  Physical Activity: Not on file  Stress: Not on file  Social Connections: Not on file  Intimate Partner Violence: Not on file     Physical  Exam   Vitals:   12/12/21 0200 12/12/21 0300  BP: 112/69 101/67  Pulse: 91 94  Resp: (!) 22 (!) 22  Temp:    SpO2: 98% 99%    CONSTITUTIONAL: Chronically ill-appearing, NAD NEURO/PSYCH: Somnolent, wakes to voice, moves all extremities, falls asleep EYES:  eyes equal and reactive ENT/NECK:  no LAD, no JVD CARDIO: Regular rate, well-perfused, normal S1 and S2 PULM:  CTAB no wheezing or rhonchi GI/GU:  non-distended, non-tender MSK/SPINE:  No gross deformities, no edema SKIN:  no rash, atraumatic   *Additional and/or pertinent findings included in MDM below  Diagnostic and Interventional Summary    EKG Interpretation  Date/Time:  Wednesday December 11 2021 23:56:45 EST Ventricular Rate:  97 PR Interval:  163 QRS Duration: 103 QT Interval:  353 QTC Calculation: 449 R Axis:   59 Text Interpretation: Sinus rhythm RSR' in V1 or V2, right VCD or RVH Baseline wander in lead(s) V1 V3 Confirmed by Gerlene Fee [phone]) on 12/12/2021 12:31:19 AM       Labs Reviewed  CBC - Abnormal; Notable for the following components:      Result Value   RBC 4.18 (*)    All other components within normal limits  COMPREHENSIVE METABOLIC PANEL - Abnormal; Notable for the following components:   Potassium 3.1 (*)    Creatinine, Ser 1.32 (*)    Calcium 8.7 (*)    All other components within normal limits  URINALYSIS, ROUTINE W REFLEX MICROSCOPIC - Abnormal; Notable for the following components:   Hgb urine dipstick SMALL (*)    Ketones, ur 5 (*)    Bacteria, UA RARE (*)    All other components within normal limits  ETHANOL - Abnormal; Notable for the following components:   Alcohol, Ethyl (B) 178 (*)    All other components within normal limits  TROPONIN I (HIGH SENSITIVITY)    No orders to display    Medications - No data to display   Procedures  /  Critical Care Procedures  ED Course and Medical Decision Making  Initial Impression and Ddx Patient presenting with suspected alcohol  intoxication, also endorsing dysuria, left lateral chest pain.  Vital signs normal, obtaining screening labs, EKG.  Past medical/surgical history that increases complexity of ED encounter: HIV, polysubstance use  Interpretation of Diagnostics I personally reviewed the EKG and my interpretation is as follows: No significant ischemic changes, sinus rhythm    Labs are reassuring with no significant blood count or electrolyte disturbance, troponin is negative.  Urinalysis is normal.  Patient Reassessment and Ultimate Disposition/Management Patient continues to rest comfortably.  Ethanol level was mildly elevated.  Patient is clinically sober and appropriate for discharge.  Patient management required discussion with the following services or consulting groups:  None  Complexity of Problems Addressed Acute illness or injury that poses threat of life of bodily function  Additional Data Reviewed and Analyzed Further history obtained from: Past medical history and medications listed in the EMR  Additional Factors Impacting ED Encounter Risk None  Barth Kirks. Sedonia Small, [place] [email or web address]  Final Clinical Impressions(s) / ED Diagnoses     ICD-10-CM   1. Side pain  R10.9       ED Discharge Orders     None        Discharge Instructions Discussed with and Provided to Patient:     Discharge Instructions      You were evaluated in the Emergency Department and after careful evaluation, we did not find any emergent condition requiring admission or further testing in the hospital.  Your exam/testing today is overall reassuring.  Please return to the Emergency Department if you experience any worsening of your condition.   Thank you for allowing [place] to be a part of your care.       Maudie Flakes, MD 12/12/21 [phone]

## 2021-12-12 NOTE — ED Notes (Signed)
Pt given urinal and requested urine specimen

## 2021-12-13 ENCOUNTER — Encounter (HOSPITAL_COMMUNITY): Payer: Self-pay | Admitting: Emergency Medicine

## 2021-12-13 ENCOUNTER — Other Ambulatory Visit: Payer: Self-pay

## 2021-12-13 ENCOUNTER — Emergency Department (HOSPITAL_COMMUNITY)
Admission: EM | Admit: 2021-12-13 | Discharge: 2021-12-13 | Disposition: A | Discharge status: Home / Self Care | Payer: Medicaid Other | Source: Home / Self Care | Attending: Emergency Medicine | Admitting: Emergency Medicine

## 2021-12-13 DIAGNOSIS — K625 Hemorrhage of anus and rectum: Secondary | ICD-10-CM

## 2021-12-13 DIAGNOSIS — R1030 Lower abdominal pain, unspecified: Secondary | ICD-10-CM

## 2021-12-13 LAB — COMPREHENSIVE METABOLIC PANEL
ALT: 38 U/L (ref 0–44)
AST: 40 U/L (ref 15–41)
Albumin: 3.7 g/dL (ref 3.5–5.0)
Alkaline Phosphatase: 58 U/L (ref 38–126)
Anion gap: 12 (ref 5–15)
BUN: 13 mg/dL (ref 6–20)
CO2: 21 mmol/L — ABNORMAL LOW (ref 22–32)
Calcium: 8.8 mg/dL — ABNORMAL LOW (ref 8.9–10.3)
Chloride: 105 mmol/L (ref 98–111)
Creatinine, Ser: 1.24 mg/dL (ref 0.61–1.24)
GFR, Estimated: >60 mL/min (ref >60)
Glucose, Bld: 100 mg/dL — ABNORMAL HIGH (ref 70–99)
Potassium: 3 mmol/L — ABNORMAL LOW (ref 3.5–5.1)
Sodium: 138 mmol/L (ref 135–145)
Total Bilirubin: 0.7 mg/dL (ref 0.3–1.2)
Total Protein: 7.5 g/dL (ref 6.5–8.1)

## 2021-12-13 LAB — CBC WITH DIFFERENTIAL/PLATELET
Abs Immature Granulocytes: 0.01 10*3/uL (ref 0.00–0.07)
Basophils Absolute: 0 10*3/uL (ref 0.0–0.1)
Basophils Relative: 1 %
Eosinophils Absolute: 0.1 10*3/uL (ref 0.0–0.5)
Eosinophils Relative: 2 %
HCT: 40.7 % (ref 39.0–52.0)
Hemoglobin: 13.6 g/dL (ref 13.0–17.0)
Immature Granulocytes: 0 %
Lymphocytes Relative: 34 %
Lymphs Abs: 2.5 10*3/uL (ref 0.7–4.0)
MCH: 31.3 pg (ref 26.0–34.0)
MCHC: 33.4 g/dL (ref 30.0–36.0)
MCV: 93.8 fL (ref 80.0–100.0)
Monocytes Absolute: 0.6 10*3/uL (ref 0.1–1.0)
Monocytes Relative: 8 %
Neutro Abs: 4.1 10*3/uL (ref 1.7–7.7)
Neutrophils Relative %: 55 %
Platelets: 314 10*3/uL (ref 150–400)
RBC: 4.34 MIL/uL (ref 4.22–5.81)
RDW: 13.1 % (ref 11.5–15.5)
WBC: 7.4 10*3/uL (ref 4.0–10.5)
nRBC: 0 % (ref 0.0–0.2)

## 2021-12-13 LAB — LIPASE, BLOOD: Lipase: 36 U/L (ref 11–51)

## 2021-12-13 MED ORDER — ONDANSETRON HCL 4 MG/2ML IJ SOLN: 4.0000 mg | Freq: Once | INTRAMUSCULAR | Status: DC

## 2021-12-13 MED ORDER — MORPHINE SULFATE (PF) 4 MG/ML IV SOLN: 4.0000 mg | Freq: Once | INTRAVENOUS | Status: DC

## 2021-12-13 NOTE — Discharge Instructions (Signed)
Please follow-up with your gastroenterologist.  Please give them a call and let them know about your visit to the emergency department and see if they can move up your appointment.

## 2021-12-13 NOTE — ED Provider Triage Note (Signed)
Emergency Medicine Provider Triage Evaluation Note  William Powers , a 41 y.o. male  was evaluated in triage.  Pt complains of constipation and rectal bleeding.  Also having hematuria..  Not on any blood thinners.  Review of Systems  Per HPI  Physical Exam  BP 129/83 (BP Location: Left Arm)    Pulse 88    Temp 98.2 F (36.8 C) (Oral)    Resp 17    Ht 6\' 4"  (1.93 m)    Wt 113.4 kg    SpO2 98%    BMI 30.43 kg/m  Gen:   Awake, no distress   Resp:  Normal effort  MSK:   Moves extremities without difficulty  Other:  Abdomen soft, generalized tenderness   Medical Decision Making  Medically screening exam initiated at 12:41 AM.  Appropriate orders placed.  William Powers was informed that the remainder of the evaluation will be completed by another provider, this initial triage assessment does not replace that evaluation, and the importance of remaining in the ED until their evaluation is complete.     Sherrill Raring, PA-C 12/13/21 [phone]

## 2021-12-13 NOTE — ED Provider Notes (Signed)
[facility] EMERGENCY DEPARTMENT Provider Note   CSN: [id number] Arrival date & time: 12/12/21  2345     History  Chief Complaint  Patient presents with   Rectal Bleeding    William Powers is a 41 y.o. male.  41 yo M with a chief complaints of abdominal pain and rectal bleeding.  This has been going on for some time now.  He tells me that he has tried to get in with gastroenterology and has an appointment with in about a month's time.  He denies fevers but feels like the bleeding is gotten worse as well as the pain.  Pain is worse lower in the abdomen worse on the right than the left.  He denies foreign body to the rectum denies change in diet denies recent travel.   Rectal Bleeding     Home Medications Prior to Admission medications   Medication Sig Start Date End Date Taking? Authorizing Provider  BIKTARVY 50-200-25 MG TABS tablet Take 1 tablet by mouth daily. 11/18/21   Deno Etienne, DO  docusate sodium (COLACE) 100 MG capsule Take 1 capsule (100 mg total) by mouth every 12 (twelve) hours. Patient not taking: Reported on 09/26/2021 08/31/21   Blanchie Dessert, MD  eplerenone (INSPRA) 25 MG tablet Take 1 tablet (25 mg total) by mouth daily. 09/24/21   Deno Etienne, DO  furosemide (LASIX) 40 MG tablet Take 1 and 1/2 tablets by mouth daily. Patient taking differently: Take 60 mg by mouth daily as needed for fluid or edema. 09/24/21 10/24/21  Deno Etienne, DO  hydrocortisone-pramoxine (PROCTOFOAM [facility]) rectal foam Place 1 applicatorful rectally 2 times daily. Patient not taking: Reported on 09/26/2021 09/24/21   Deno Etienne, DO  metoprolol succinate (TOPROL-XL) 50 MG 24 hr tablet Take 1 tablet by mouth 2 times daily. 11/18/21   Deno Etienne, DO  potassium chloride SA (KLOR-CON) 20 MEQ tablet Take 1 tablet (20 mEq total) by mouth daily at 6 (six) AM. Patient not taking: Reported on 09/09/2021 09/05/21   Carlisle Cater, PA-C  rivaroxaban (XARELTO) 20 MG TABS tablet Take 1 tablet  by mouth daily with supper. Patient not taking: Reported on 09/26/2021 09/24/21   Deno Etienne, DO  Potassium Chloride ER 20 MEQ TBCR Take 20 mEq by mouth daily at 6 (six) AM. Patient not taking: No sig reported 08/07/21 09/05/21  Reubin Milan, MD      Allergies    Bee venom, Lisinopril, and Sulfamethoxazole-trimethoprim    Review of Systems   Review of Systems  Gastrointestinal:  Positive for hematochezia.   Physical Exam Updated Vital Signs BP 115/76 (BP Location: Right Arm)    Pulse 78    Temp 98.2 F (36.8 C) (Oral)    Resp (!) 22    Ht 6\' 4"  (1.93 m)    Wt 113.4 kg    SpO2 98%    BMI 30.43 kg/m  Physical Exam Vitals and nursing note reviewed.  Constitutional:      Appearance: He is well-developed.  HENT:     Head: Normocephalic and atraumatic.  Eyes:     Pupils: Pupils are equal, round, and reactive to light.  Neck:     Vascular: No JVD.  Cardiovascular:     Rate and Rhythm: Normal rate and regular rhythm.     Heart sounds: No murmur heard.   No friction rub. No gallop.  Pulmonary:     Effort: No respiratory distress.     Breath sounds: No wheezing.  Abdominal:     General: There is no distension.     Tenderness: There is abdominal tenderness. There is guarding. There is no rebound.     Comments: Lower abdominal discomfort with some guarding especially in the right lower quadrant.  Musculoskeletal:        General: Normal range of motion.     Cervical back: Normal range of motion and neck supple.  Skin:    Coloration: Skin is not pale.     Findings: No rash.  Neurological:     Mental Status: He is alert and oriented to person, place, and time.  Psychiatric:        Behavior: Behavior normal.    ED Results / Procedures / Treatments   Labs (all labs ordered are listed, but only abnormal results are displayed) Labs Reviewed  COMPREHENSIVE METABOLIC PANEL - Abnormal; Notable for the following components:      Result Value   Potassium 3.0 (*)    CO2 21 (*)     Glucose, Bld 100 (*)    Calcium 8.8 (*)    All other components within normal limits  CBC WITH DIFFERENTIAL/PLATELET  LIPASE, BLOOD  URINALYSIS, ROUTINE W REFLEX MICROSCOPIC    EKG None  Radiology No results found.  Procedures Procedures    Medications Ordered in ED Medications - No data to display   ED Course/ Medical Decision Making/ A&P                           Medical Decision Making Amount and/or Complexity of Data Reviewed Radiology: ordered.  Risk Prescription drug management.   Patient is a 41 y.o. male with a cc of abdominal pain and rectal bleeding.  This been going on for about 3 to 4 weeks now.  He feels like its been getting a lot worse and has been feeling more fatigued.  Lab work-up here without significant anemia no significant electrolyte abnormality no significant renal dysfunction.  Does have a history of HIV he denies any fevers or any foreign bodies to the rectum.. I reviewed the patients chart and he has had quite a number of visits for rectal bleeding in the past.  Has had also a couple diagnoses of malingering.    Based on this review and him having at least 3 CT scans since October we will hold off on recurrent imaging.Marland Kitchen   3:38 AM:  I have discussed the diagnosis/risks/treatment options with the patient.  Evaluation and diagnostic testing in the emergency department does not suggest an emergent condition requiring admission or immediate intervention beyond what has been performed at this time.  They will follow up with  GI, PCP. We also discussed returning to the ED immediately if new or worsening sx occur. We discussed the sx which are most concerning (e.g., sudden worsening pain, fever, inability to tolerate by mouth) that necessitate immediate return. Medications administered to the patient during their visit and any new prescriptions provided to the patient are listed below.  Medications given during this visit Medications - No data to  display   The patient appears reasonably screen and/or stabilized for discharge and I doubt any other medical condition or other [place] requiring further screening, evaluation, or treatment in the ED at this time prior to discharge.           Final Clinical Impression(s) / ED Diagnoses Final diagnoses:  Rectal bleeding  Lower abdominal pain    Rx / DC  Orders ED Discharge Orders     None         Deno Etienne, [place] 12/13/21 [phone]

## 2021-12-13 NOTE — ED Triage Notes (Signed)
Pt reported to ED with c/o blood in stool and "straining to pee"  x4- 5 months. Pt endorses urinary pain and frequency. States that he has to sit to pee because "stool comes out with it".

## 2021-12-16 ENCOUNTER — Emergency Department (HOSPITAL_COMMUNITY)
Admission: EM | Admit: 2021-12-16 | Discharge: 2021-12-16 | Disposition: A | Discharge status: Home / Self Care | Payer: Medicaid Other | Attending: Emergency Medicine | Admitting: Emergency Medicine

## 2021-12-16 ENCOUNTER — Encounter (HOSPITAL_COMMUNITY): Payer: Self-pay

## 2021-12-16 ENCOUNTER — Emergency Department (HOSPITAL_COMMUNITY): Payer: Medicaid Other

## 2021-12-16 DIAGNOSIS — R202 Paresthesia of skin: Secondary | ICD-10-CM | POA: Insufficient documentation

## 2021-12-16 DIAGNOSIS — Z21 Asymptomatic human immunodeficiency virus [HIV] infection status: Secondary | ICD-10-CM | POA: Diagnosis not present

## 2021-12-16 DIAGNOSIS — F10129 Alcohol abuse with intoxication, unspecified: Secondary | ICD-10-CM | POA: Insufficient documentation

## 2021-12-16 DIAGNOSIS — R079 Chest pain, unspecified: Secondary | ICD-10-CM | POA: Insufficient documentation

## 2021-12-16 DIAGNOSIS — I509 Heart failure, unspecified: Secondary | ICD-10-CM | POA: Diagnosis not present

## 2021-12-16 DIAGNOSIS — Y906 Blood alcohol level of 120-199 mg/100 ml: Secondary | ICD-10-CM | POA: Diagnosis not present

## 2021-12-16 DIAGNOSIS — R0602 Shortness of breath: Secondary | ICD-10-CM | POA: Insufficient documentation

## 2021-12-16 DIAGNOSIS — F10920 Alcohol use, unspecified with intoxication, uncomplicated: Secondary | ICD-10-CM

## 2021-12-16 LAB — RAPID URINE DRUG SCREEN, HOSP PERFORMED
Amphetamines: NOT DETECTED
Barbiturates: NOT DETECTED
Benzodiazepines: NOT DETECTED
Cocaine: NOT DETECTED
Opiates: NOT DETECTED
Tetrahydrocannabinol: NOT DETECTED

## 2021-12-16 LAB — COMPREHENSIVE METABOLIC PANEL
ALT: 32 U/L (ref 0–44)
AST: 28 U/L (ref 15–41)
Albumin: 3.8 g/dL (ref 3.5–5.0)
Alkaline Phosphatase: 48 U/L (ref 38–126)
Anion gap: 8 (ref 5–15)
BUN: 12 mg/dL (ref 6–20)
CO2: 21 mmol/L — ABNORMAL LOW (ref 22–32)
Calcium: 8.8 mg/dL — ABNORMAL LOW (ref 8.9–10.3)
Chloride: 106 mmol/L (ref 98–111)
Creatinine, Ser: 0.99 mg/dL (ref 0.61–1.24)
GFR, Estimated: >60 mL/min (ref >60)
Glucose, Bld: 115 mg/dL — ABNORMAL HIGH (ref 70–99)
Potassium: 3 mmol/L — ABNORMAL LOW (ref 3.5–5.1)
Sodium: 135 mmol/L (ref 135–145)
Total Bilirubin: 0.4 mg/dL (ref 0.3–1.2)
Total Protein: 7.6 g/dL (ref 6.5–8.1)

## 2021-12-16 LAB — CBC WITH DIFFERENTIAL/PLATELET
Abs Immature Granulocytes: 0.01 10*3/uL (ref 0.00–0.07)
Basophils Absolute: 0.1 10*3/uL (ref 0.0–0.1)
Basophils Relative: 1 %
Eosinophils Absolute: 0.1 10*3/uL (ref 0.0–0.5)
Eosinophils Relative: 2 %
HCT: 41.1 % (ref 39.0–52.0)
Hemoglobin: 13.8 g/dL (ref 13.0–17.0)
Immature Granulocytes: 0 %
Lymphocytes Relative: 30 %
Lymphs Abs: 2 10*3/uL (ref 0.7–4.0)
MCH: 31.7 pg (ref 26.0–34.0)
MCHC: 33.6 g/dL (ref 30.0–36.0)
MCV: 94.5 fL (ref 80.0–100.0)
Monocytes Absolute: 0.6 10*3/uL (ref 0.1–1.0)
Monocytes Relative: 9 %
Neutro Abs: 3.8 10*3/uL (ref 1.7–7.7)
Neutrophils Relative %: 58 %
Platelets: 295 10*3/uL (ref 150–400)
RBC: 4.35 MIL/uL (ref 4.22–5.81)
RDW: 13.4 % (ref 11.5–15.5)
WBC: 6.6 10*3/uL (ref 4.0–10.5)
nRBC: 0 % (ref 0.0–0.2)

## 2021-12-16 LAB — BRAIN NATRIURETIC PEPTIDE: B Natriuretic Peptide: 21.1 pg/mL (ref 0.0–100.0)

## 2021-12-16 LAB — ETHANOL: Alcohol, Ethyl (B): 145 mg/dL — ABNORMAL HIGH (ref <10)

## 2021-12-16 LAB — TROPONIN I (HIGH SENSITIVITY): Troponin I (High Sensitivity): 6 ng/L (ref <18)

## 2021-12-16 NOTE — Discharge Instructions (Signed)
Your cardiac work-up today was normal. Follow-up with your primary care doctor. Return here for new concerns. 

## 2021-12-16 NOTE — ED Provider Notes (Signed)
[facility] DEPT Provider Note   CSN: [id number] Arrival date & time: 12/16/21  0116     History  Chief Complaint  Patient presents with   Chest Pain    William Powers is a 41 y.o. male.  The history is provided by the patient and medical records.  Chest Pain  41 year old male with history of CHF, cardiomyopathy, HIV, HSV-2, ongoing GI bleeding, presenting to the ED for chest pain.  Per EMS, left sided chest pain w/some tingling ongoing for "a while".  Unable to specify exactly how long.  He states if he holds his arm up and hits himself in the chest the pain improves.  He denies cough/fever.    Home Medications Prior to Admission medications   Medication Sig Start Date End Date Taking? Authorizing Provider  BIKTARVY 50-200-25 MG TABS tablet Take 1 tablet by mouth daily. 11/18/21   Deno Etienne, DO  docusate sodium (COLACE) 100 MG capsule Take 1 capsule (100 mg total) by mouth every 12 (twelve) hours. Patient not taking: Reported on 09/26/2021 08/31/21   Blanchie Dessert, MD  eplerenone (INSPRA) 25 MG tablet Take 1 tablet (25 mg total) by mouth daily. 09/24/21   Deno Etienne, DO  furosemide (LASIX) 40 MG tablet Take 1 and 1/2 tablets by mouth daily. Patient taking differently: Take 60 mg by mouth daily as needed for fluid or edema. 09/24/21 10/24/21  Deno Etienne, DO  hydrocortisone-pramoxine (PROCTOFOAM [facility]) rectal foam Place 1 applicatorful rectally 2 times daily. Patient not taking: Reported on 09/26/2021 09/24/21   Deno Etienne, DO  metoprolol succinate (TOPROL-XL) 50 MG 24 hr tablet Take 1 tablet by mouth 2 times daily. 11/18/21   Deno Etienne, DO  potassium chloride SA (KLOR-CON) 20 MEQ tablet Take 1 tablet (20 mEq total) by mouth daily at 6 (six) AM. Patient not taking: Reported on 09/09/2021 09/05/21   Carlisle Cater, PA-C  rivaroxaban (XARELTO) 20 MG TABS tablet Take 1 tablet by mouth daily with supper. Patient not taking: Reported on 09/26/2021 09/24/21    Deno Etienne, DO  Potassium Chloride ER 20 MEQ TBCR Take 20 mEq by mouth daily at 6 (six) AM. Patient not taking: No sig reported 08/07/21 09/05/21  Reubin Milan, MD      Allergies    Bee venom, Lisinopril, and Sulfamethoxazole-trimethoprim    Review of Systems   Review of Systems  Cardiovascular:  Positive for chest pain.  All other systems reviewed and are negative.  Physical Exam Updated Vital Signs BP (!) 124/56    Pulse 90    Temp (!) 97.5 F (36.4 C) (Oral)    Resp 20    SpO2 97%   Physical Exam Vitals and nursing note reviewed.  Constitutional:      Appearance: He is well-developed.     Comments: Unable to stay awake for questioning during exam, breath smells of EtOH  HENT:     Head: Normocephalic and atraumatic.  Eyes:     Conjunctiva/sclera: Conjunctivae normal.     Pupils: Pupils are equal, round, and reactive to light.  Cardiovascular:     Rate and Rhythm: Normal rate and regular rhythm.     Heart sounds: Normal heart sounds.  Pulmonary:     Effort: Pulmonary effort is normal.     Breath sounds: Normal breath sounds. No wheezing or rhonchi.  Abdominal:     General: Bowel sounds are normal.     Palpations: Abdomen is soft.  Musculoskeletal:  General: Normal range of motion.     Cervical back: Normal range of motion.  Skin:    General: Skin is warm and dry.  Neurological:     Mental Status: He is oriented to person, place, and time.    ED Results / Procedures / Treatments   Labs (all labs ordered are listed, but only abnormal results are displayed) Labs Reviewed  COMPREHENSIVE METABOLIC PANEL - Abnormal; Notable for the following components:      Result Value   Potassium 3.0 (*)    CO2 21 (*)    Glucose, Bld 115 (*)    Calcium 8.8 (*)    All other components within normal limits  ETHANOL - Abnormal; Notable for the following components:   Alcohol, Ethyl (B) 145 (*)    All other components within normal limits  CBC WITH  DIFFERENTIAL/PLATELET  BRAIN NATRIURETIC PEPTIDE  RAPID URINE DRUG SCREEN, HOSP PERFORMED  TROPONIN I (HIGH SENSITIVITY)  TROPONIN I (HIGH SENSITIVITY)    EKG None  Radiology DG Chest 2 View  Result Date: 12/16/2021 CLINICAL DATA:  Chest pains with deep inspiration. EXAM: CHEST - 2 VIEW COMPARISON:  PA Lat 11/17/2021 FINDINGS: The heart size and mediastinal contours are within normal limits. Both lungs are clear although with interval decreased inspiration and increased mild elevation of the right diaphragm. The visualized skeletal structures are unremarkable. IMPRESSION: No active cardiopulmonary disease, with decreased inspiration compared to the prior study. Electronically Signed   By: Telford Nab M.D.   On: 12/16/2021 02:42    Procedures Procedures    Medications Ordered in ED Medications - No data to display  ED Course/ Medical Decision Making/ A&P                           Medical Decision Making Amount and/or Complexity of Data Reviewed Labs: ordered. Radiology: ordered and independent interpretation performed. ECG/medicine tests: ordered and independent interpretation performed.   41 year old male presenting to the ED with chest pain for "a while".  Better if he lifts his arm up and hits himself in the chest with the other arm.  He is snoring on exam, difficult to arouse and remain awake for questioning.  He denies shortness of breath at present.  No cough, fever, or chills.  His breath does smell of alcohol and he has history of polysubstance abuse.  EKG is nonischemic and overall unchanged from prior.  Will obtain labs, ethanol, UDS, chest x-ray.  Labs as above, appear around his baseline.  Hemoglobin is stable.  Troponin and BNP are both within normal limits.  His ethanol is elevated at 145 which also appears around his baseline as he is frequently intoxicated during ED visits.  Chest x-ray is clear.  Patient was monitored here for 3.5 hours, no acute changes.  His  vitals have remained stable on room air.  He has no tachycardia or hypoxia.  Do not feel he needs repeat troponin or ongoing work-up at this time.  He has been able to stand and ambulate in room without falling, more awake/alert at this time.  Stable for discharge to follow-up with PCP.  Return here for new concerns.  Final Clinical Impression(s) / ED Diagnoses Final diagnoses:  Chest pain, unspecified type  Alcoholic intoxication without complication [facility])    Rx / DC Orders ED Discharge Orders     None         Larene Pickett, PA-C 12/16/21 [phone]  Quintella Reichert, MD 12/16/21 0630

## 2021-12-16 NOTE — ED Triage Notes (Signed)
Patient arrived with complaints of left sided chest pain and tingling over the last few days that worsens with taking a deep breath and movement.

## 2021-12-17 ENCOUNTER — Other Ambulatory Visit (HOSPITAL_COMMUNITY): Payer: Self-pay

## 2021-12-19 ENCOUNTER — Other Ambulatory Visit (HOSPITAL_COMMUNITY): Payer: Self-pay

## 2021-12-20 ENCOUNTER — Other Ambulatory Visit (HOSPITAL_COMMUNITY): Payer: Self-pay

## 2022-01-09 ENCOUNTER — Other Ambulatory Visit (HOSPITAL_COMMUNITY): Payer: Self-pay

## 2022-01-14 ENCOUNTER — Other Ambulatory Visit (HOSPITAL_COMMUNITY): Payer: Self-pay

## 2022-02-09 ENCOUNTER — Emergency Department (HOSPITAL_COMMUNITY)
Admission: EM | Admit: 2022-02-09 | Discharge: 2022-02-10 | Discharge status: Against Medical Advice | Payer: Medicaid Other | Attending: Emergency Medicine | Admitting: Emergency Medicine

## 2022-02-09 ENCOUNTER — Encounter (HOSPITAL_COMMUNITY): Payer: Self-pay | Admitting: Emergency Medicine

## 2022-02-09 DIAGNOSIS — I509 Heart failure, unspecified: Secondary | ICD-10-CM | POA: Diagnosis not present

## 2022-02-09 DIAGNOSIS — F1010 Alcohol abuse, uncomplicated: Secondary | ICD-10-CM | POA: Insufficient documentation

## 2022-02-09 DIAGNOSIS — R058 Other specified cough: Secondary | ICD-10-CM | POA: Diagnosis not present

## 2022-02-09 DIAGNOSIS — Z7901 Long term (current) use of anticoagulants: Secondary | ICD-10-CM | POA: Diagnosis not present

## 2022-02-09 DIAGNOSIS — R45851 Suicidal ideations: Secondary | ICD-10-CM | POA: Insufficient documentation

## 2022-02-09 DIAGNOSIS — J45909 Unspecified asthma, uncomplicated: Secondary | ICD-10-CM | POA: Diagnosis not present

## 2022-02-09 DIAGNOSIS — Y903 Blood alcohol level of 60-79 mg/100 ml: Secondary | ICD-10-CM | POA: Diagnosis not present

## 2022-02-09 DIAGNOSIS — R0602 Shortness of breath: Secondary | ICD-10-CM | POA: Diagnosis present

## 2022-02-09 DIAGNOSIS — Z79899 Other long term (current) drug therapy: Secondary | ICD-10-CM | POA: Diagnosis not present

## 2022-02-09 DIAGNOSIS — Z20822 Contact with and (suspected) exposure to covid-19: Secondary | ICD-10-CM | POA: Insufficient documentation

## 2022-02-09 DIAGNOSIS — Z21 Asymptomatic human immunodeficiency virus [HIV] infection status: Secondary | ICD-10-CM | POA: Diagnosis not present

## 2022-02-09 DIAGNOSIS — I110 Hypertensive heart disease with heart failure: Secondary | ICD-10-CM | POA: Diagnosis not present

## 2022-02-09 DIAGNOSIS — Z7951 Long term (current) use of inhaled steroids: Secondary | ICD-10-CM | POA: Diagnosis not present

## 2022-02-09 NOTE — ED Triage Notes (Addendum)
Presents from concert for SOB, has been coughing with wheeze for the last few days but has been out of his rescue inhaler d/t insurance issues. EMS notes diminished breath sounds but no wheeze. 5mg  albuterol nb provided, no other medications. ? ?Pt states that he feels hopeless, would like staff to know that he has felt like ending his life twice this week.  ? ?H/o asthma, HIV, HTN.  ? ? ?

## 2022-02-10 ENCOUNTER — Other Ambulatory Visit: Payer: Self-pay

## 2022-02-10 ENCOUNTER — Emergency Department (HOSPITAL_COMMUNITY): Payer: Medicaid Other

## 2022-02-10 ENCOUNTER — Emergency Department (HOSPITAL_COMMUNITY)
Admission: EM | Admit: 2022-02-10 | Discharge: 2022-02-11 | Disposition: A | Discharge status: Home / Self Care | Payer: Medicaid Other | Source: Home / Self Care | Attending: Emergency Medicine | Admitting: Emergency Medicine

## 2022-02-10 ENCOUNTER — Encounter (HOSPITAL_COMMUNITY): Payer: Self-pay

## 2022-02-10 DIAGNOSIS — I509 Heart failure, unspecified: Secondary | ICD-10-CM | POA: Insufficient documentation

## 2022-02-10 DIAGNOSIS — Z7901 Long term (current) use of anticoagulants: Secondary | ICD-10-CM | POA: Insufficient documentation

## 2022-02-10 DIAGNOSIS — Z79899 Other long term (current) drug therapy: Secondary | ICD-10-CM | POA: Insufficient documentation

## 2022-02-10 DIAGNOSIS — J45909 Unspecified asthma, uncomplicated: Secondary | ICD-10-CM | POA: Insufficient documentation

## 2022-02-10 DIAGNOSIS — I110 Hypertensive heart disease with heart failure: Secondary | ICD-10-CM | POA: Insufficient documentation

## 2022-02-10 DIAGNOSIS — Z21 Asymptomatic human immunodeficiency virus [HIV] infection status: Secondary | ICD-10-CM | POA: Insufficient documentation

## 2022-02-10 DIAGNOSIS — Z8709 Personal history of other diseases of the respiratory system: Secondary | ICD-10-CM

## 2022-02-10 LAB — COMPREHENSIVE METABOLIC PANEL
ALT: 42 U/L (ref 0–44)
AST: 37 U/L (ref 15–41)
Albumin: 3.5 g/dL (ref 3.5–5.0)
Alkaline Phosphatase: 59 U/L (ref 38–126)
Anion gap: 9 (ref 5–15)
BUN: 11 mg/dL (ref 6–20)
CO2: 20 mmol/L — ABNORMAL LOW (ref 22–32)
Calcium: 8.3 mg/dL — ABNORMAL LOW (ref 8.9–10.3)
Chloride: 108 mmol/L (ref 98–111)
Creatinine, Ser: 0.9 mg/dL (ref 0.61–1.24)
GFR, Estimated: >60 mL/min (ref >60)
Glucose, Bld: 100 mg/dL — ABNORMAL HIGH (ref 70–99)
Potassium: 3.5 mmol/L (ref 3.5–5.1)
Sodium: 137 mmol/L (ref 135–145)
Total Bilirubin: 0.6 mg/dL (ref 0.3–1.2)
Total Protein: 7.9 g/dL (ref 6.5–8.1)

## 2022-02-10 LAB — RESP PANEL BY RT-PCR (FLU A&B, COVID) ARPGX2
Influenza A by PCR: NEGATIVE
Influenza B by PCR: NEGATIVE
SARS Coronavirus 2 by RT PCR: NEGATIVE

## 2022-02-10 LAB — CBC WITH DIFFERENTIAL/PLATELET
Abs Immature Granulocytes: 0.02 10*3/uL (ref 0.00–0.07)
Basophils Absolute: 0 10*3/uL (ref 0.0–0.1)
Basophils Relative: 1 %
Eosinophils Absolute: 0.1 10*3/uL (ref 0.0–0.5)
Eosinophils Relative: 3 %
HCT: 38.1 % — ABNORMAL LOW (ref 39.0–52.0)
Hemoglobin: 12.8 g/dL — ABNORMAL LOW (ref 13.0–17.0)
Immature Granulocytes: 0 %
Lymphocytes Relative: 41 %
Lymphs Abs: 1.9 10*3/uL (ref 0.7–4.0)
MCH: 31.7 pg (ref 26.0–34.0)
MCHC: 33.6 g/dL (ref 30.0–36.0)
MCV: 94.3 fL (ref 80.0–100.0)
Monocytes Absolute: 0.4 10*3/uL (ref 0.1–1.0)
Monocytes Relative: 9 %
Neutro Abs: 2.1 10*3/uL (ref 1.7–7.7)
Neutrophils Relative %: 46 %
Platelets: 290 10*3/uL (ref 150–400)
RBC: 4.04 MIL/uL — ABNORMAL LOW (ref 4.22–5.81)
RDW: 14.2 % (ref 11.5–15.5)
WBC: 4.7 10*3/uL (ref 4.0–10.5)
nRBC: 0 % (ref 0.0–0.2)

## 2022-02-10 LAB — ETHANOL: Alcohol, Ethyl (B): 77 mg/dL — ABNORMAL HIGH (ref <10)

## 2022-02-10 LAB — RAPID URINE DRUG SCREEN, HOSP PERFORMED
Amphetamines: NOT DETECTED
Barbiturates: NOT DETECTED
Benzodiazepines: NOT DETECTED
Cocaine: POSITIVE — AB
Opiates: NOT DETECTED
Tetrahydrocannabinol: POSITIVE — AB

## 2022-02-10 LAB — ACETAMINOPHEN LEVEL: Acetaminophen (Tylenol), Serum: <10 ug/mL — ABNORMAL LOW (ref 10–30)

## 2022-02-10 LAB — SALICYLATE LEVEL: Salicylate Lvl: <7 mg/dL — ABNORMAL LOW (ref 7.0–30.0)

## 2022-02-10 MED ORDER — ALBUTEROL SULFATE HFA 108 (90 BASE) MCG/ACT IN AERS
2.0000 | INHALATION_SPRAY | Freq: Once | RESPIRATORY_TRACT | Status: AC
  Administered 2022-02-10: 2 via RESPIRATORY_TRACT
  Filled 2022-02-10: qty 6.7

## 2022-02-10 NOTE — ED Provider Triage Note (Signed)
Emergency Medicine Provider Triage Evaluation Note ? ?[address] [place] , a 41 y.o. male  was evaluated in triage.  Pt complains of needing an inhaler.  Patient was seen here earlier in the day and was given a prescription for an inhaler for his asthma.  He returned to the ER as he states he went to the pharmacy and could not obtain it as reportedly his Medicaid did not cover it.  Is also seen here and evaluated by TTS, deemed not suicidal.  Denies any additional SI ? ?Review of Systems  ?Positive: As above ?Negative: As above ? ?Physical Exam  ?Ht 6\' 4"  (1.93 m)   Wt 112 kg   BMI 30.07 kg/m?  ?Gen:   Awake, no distress   ?Resp:  Normal effort  ?MSK:   Moves extremities without difficulty  ?Other:  Speaking in full sentences, no evidence of respiratory distress, no audible wheezes ? ?Medical Decision Making  ?Medically screening exam initiated at 11:32 PM.  Appropriate orders placed.  William Powers was informed that the remainder of the evaluation will be completed by another provider, this initial triage assessment does not replace that evaluation, and the importance of remaining in the ED until their evaluation is complete. ? ? ?  ?William Balding, PA-C ?02/10/22 2333 ? ?

## 2022-02-10 NOTE — ED Notes (Signed)
ED Provider at bedside. 

## 2022-02-10 NOTE — ED Notes (Signed)
Pt leaving AMA. ED RN and MD encouraged pt to stay. Pt stated "he had a cardiology appointment at 1000". Pt belongings given ?

## 2022-02-10 NOTE — ED Notes (Signed)
Aware of need for urine, urinal at bedside ?

## 2022-02-10 NOTE — ED Notes (Signed)
Secretary noted pt sats dropping and spontaneously increasing from monitor station. Noted pt to be snoring and wakes himself spontaneously. States he has been told he needs to be evaluated for sleep apnea but does not use a CPAP at home. ? ?While in room, stressed importance for patient to provide urine sample, states he does not need to urinate, beverage provided.  ? ?IV placed in R hand for blood work, pt insists for this to be removed, states he would rather be "stuck again" for any additional labs or IV med admin ?

## 2022-02-10 NOTE — ED Provider Notes (Signed)
?[facility] DEPT ?Provider Note ? ? ?CSN: [id number] ?Arrival date & time: 02/09/22  2319 ? ?  ? ?History ? ?Chief Complaint  ?Patient presents with  ? Shortness of Breath  ? ? ?William Powers is a 41 y.o. male. ? ?The history is provided by the patient and medical records.  ?Shortness of Breath ? ?41 y.o. M with hx of CHF, anemia, alcohol abuse, presenting to the ED for cough and SOB.  States ongoing for the past few days intermittently.  Cough is dry and non-productive.  He denies fever/chills.  States he has run out of his home inhaler.  EMS gave albuterol which he states helped some.   ? ?Patient also reports SI.  He states twice this past week he has felt like ending his life.  He denies any plan.  He states "my life is just awful".  He denies any drug or alcohol abuse today. ? ?Home Medications ?Prior to Admission medications   ?Medication Sig Start Date End Date Taking? Authorizing Provider  ?BIKTARVY 50-200-25 MG TABS tablet Take 1 tablet by mouth daily. 11/18/21   Deno Etienne, DO  ?docusate sodium (COLACE) 100 MG capsule Take 1 capsule (100 mg total) by mouth every 12 (twelve) hours. ?Patient not taking: Reported on 09/26/2021 08/31/21   Blanchie Dessert, MD  ?eplerenone (INSPRA) 25 MG tablet Take 1 tablet (25 mg total) by mouth daily. 09/24/21   Deno Etienne, DO  ?furosemide (LASIX) 40 MG tablet Take 1 and 1/2 tablets by mouth daily. ?Patient taking differently: Take 60 mg by mouth daily as needed for fluid or edema. 09/24/21 10/24/21  Deno Etienne, DO  ?hydrocortisone-pramoxine (PROCTOFOAM [facility]) rectal foam Place 1 applicatorful rectally 2 times daily. ?Patient not taking: Reported on 09/26/2021 09/24/21   Deno Etienne, DO  ?metoprolol succinate (TOPROL-XL) 50 MG 24 hr tablet Take 1 tablet by mouth 2 times daily. 11/18/21   Deno Etienne, DO  ?potassium chloride SA (KLOR-CON) 20 MEQ tablet Take 1 tablet (20 mEq total) by mouth daily at 6 (six) AM. ?Patient not taking: Reported on 09/09/2021  09/05/21   Carlisle Cater, PA-C  ?rivaroxaban (XARELTO) 20 MG TABS tablet Take 1 tablet by mouth daily with supper. ?Patient not taking: Reported on 09/26/2021 09/24/21   Deno Etienne, DO  ?Potassium Chloride ER 20 MEQ TBCR Take 20 mEq by mouth daily at 6 (six) AM. ?Patient not taking: No sig reported 08/07/21 09/05/21  Reubin Milan, MD  ?   ? ?Allergies    ?Bee venom, Lisinopril, and Sulfamethoxazole-trimethoprim   ? ?Review of Systems   ?Review of Systems  ?Respiratory:  Positive for shortness of breath.   ?All other systems reviewed and are negative. ? ?Physical Exam ?Updated Vital Signs ?BP 119/76   Pulse 97   Temp 97.9 ?F (36.6 ?C) (Oral) Comment: Simultaneous filing. User may not have seen previous data.  Resp (!) 21   Wt 112 kg   SpO2 95%   BMI 30.07 kg/m?  ? ?Physical Exam ?Vitals and nursing note reviewed.  ?Constitutional:   ?   Appearance: He is well-developed.  ?HENT:  ?   Head: Normocephalic and atraumatic.  ?Eyes:  ?   Conjunctiva/sclera: Conjunctivae normal.  ?   Pupils: Pupils are equal, round, and reactive to light.  ?Cardiovascular:  ?   Rate and Rhythm: Normal rate and regular rhythm.  ?   Heart sounds: Normal heart sounds.  ?Pulmonary:  ?   Effort: Pulmonary effort is normal.  ?  Breath sounds: Normal breath sounds. No wheezing or rhonchi.  ?   Comments: Lungs grossly clear without wheezes or rhonchi, NAD ?Abdominal:  ?   General: Bowel sounds are normal.  ?   Palpations: Abdomen is soft.  ?Musculoskeletal:     ?   General: Normal range of motion.  ?   Cervical back: Normal range of motion.  ?Skin: ?   General: Skin is warm and dry.  ?Neurological:  ?   Mental Status: He is alert and oriented to person, place, and time.  ? ? ?ED Results / Procedures / Treatments   ?Labs ?(all labs ordered are listed, but only abnormal results are displayed) ?Labs Reviewed  ?CBC WITH DIFFERENTIAL/PLATELET - Abnormal; Notable for the following components:  ?    Result Value  ? RBC 4.04 (*)   ? Hemoglobin  12.8 (*)   ? HCT 38.1 (*)   ? All other components within normal limits  ?COMPREHENSIVE METABOLIC PANEL - Abnormal; Notable for the following components:  ? CO2 20 (*)   ? Glucose, Bld 100 (*)   ? Calcium 8.3 (*)   ? All other components within normal limits  ?ETHANOL - Abnormal; Notable for the following components:  ? Alcohol, Ethyl (B) 77 (*)   ? All other components within normal limits  ?RAPID URINE DRUG SCREEN, HOSP PERFORMED - Abnormal; Notable for the following components:  ? Cocaine POSITIVE (*)   ? Tetrahydrocannabinol POSITIVE (*)   ? All other components within normal limits  ?SALICYLATE LEVEL - Abnormal; Notable for the following components:  ? Salicylate Lvl [id number] (*)   ? All other components within normal limits  ?ACETAMINOPHEN LEVEL - Abnormal; Notable for the following components:  ? Acetaminophen (Tylenol), Serum <10 (*)   ? All other components within normal limits  ?RESP PANEL BY RT-PCR (FLU A&B, COVID) ARPGX2  ? ? ?EKG ?EKG Interpretation ? ?Date/Time:  Sunday February 09 2022 23:35:04 EDT ?Ventricular Rate:  116 ?PR Interval:  160 ?QRS Duration: 92 ?QT Interval:  325 ?QTC Calculation: 452 ?R Axis:   52 ?Text Interpretation: Sinus tachycardia Probable left atrial enlargement Borderline T abnormalities, inferior leads Interpretation limited secondary to artifact Confirmed by Ripley Fraise [phone]) on 02/10/2022 1:17:27 AM ? ?Radiology ?DG Chest 2 View ? ?Result Date: 02/10/2022 ?CLINICAL DATA:  Coughing and shortness of breath. EXAM: CHEST - 2 VIEW COMPARISON:  PA Lat 12/16/2021. FINDINGS: The heart is slightly enlarged. No vascular congestion is seen. The mediastinal configuration is stable and there is mild chronic elevation right hemidiaphragm. The lungs are clear. No pleural effusion is evident. Thoracic cage grossly intact. IMPRESSION: No evidence of acute chest disease. Stable chest with slight cardiomegaly. Electronically Signed   By: Telford Nab M.D.   On: 02/10/2022 00:23    ? ?Procedures ?Procedures  ? ? ?Medications Ordered in ED ?Medications  ?albuterol (VENTOLIN HFA) 108 (90 Base) MCG/ACT inhaler 2 puff (2 puffs Inhalation Given 02/10/22 0017)  ? ? ?ED Course/ Medical Decision Making/ A&P ?  ?                        ?Medical Decision Making ?Amount and/or Complexity of Data Reviewed ?Labs: ordered. ?Radiology: ordered and independent interpretation performed. ?ECG/medicine tests: ordered and independent interpretation performed. ? ?Risk ?Prescription drug management. ? ? ?41 year old male presenting to the ED initially with shortness of breath after running out of his albuterol inhaler.  He is afebrile, nontoxic, lung sounds are grossly  clear on exam.  He denies any chest pain.  Chest x-ray is clear. ? ?After arrival in the ED, now reports wanting to end his life twice this week.  He denies any specific reasoning or plan, just stating "my life is awful".  Denies alcohol or drug use today. ? ?Screening labs as above--ethanol 77.  UDS is positive for cocaine and THC.  Remainder of labs are overall reassuring.  Medically clear.  Will get TTS evaluation. ? ?5:47 AM ?TTS evaluated-- recommends overnight observation and reassessment by psychiatry in the morning. ? ?Final Clinical Impression(s) / ED Diagnoses ?Final diagnoses:  ?Shortness of breath  ?Suicidal ideation  ? ? ?Rx / DC Orders ?ED Discharge Orders   ? ? None  ? ?  ? ? ?  ?Larene Pickett, PA-C ?02/10/22 [phone] ? ?  ?Ripley Fraise, MD ?02/10/22 [phone] ? ?

## 2022-02-10 NOTE — ED Notes (Signed)
Pt placed into purple scrubs, all belongings placed in cabinet (shoes, socks, pants, lighter, charging cable, phone, cigarettes x4, underwear, shirt, hat, sunglasses).  ? ?Cords removed from bed area and placed in cabinets.  ?

## 2022-02-10 NOTE — ED Notes (Signed)
During ED RN morning assessment, pt states "he has a cardiologist appointment at 1000". ED RN notified MD Wallace Cullens. ED RN assessed if pt is IVC'd. Pt currently is not. ED RN encouraged pt to stay for TTS eval. Pt stated "he cannot miss his cardiology appointment".  ?

## 2022-02-10 NOTE — ED Triage Notes (Signed)
Pt c/o SOB since not having rescue inhaler. Pt has problem with insurance and is unable to get it. Denies chest pain. Pt has history of asthma ?

## 2022-02-10 NOTE — Discharge Instructions (Addendum)
It was a pleasure caring for you today in the emergency department. ? ?Please return to the emergency department for any worsening or worrisome symptoms. ? ?For your behavioral health needs you are advised to follow up with your current outpatient provider.  If you do not have a provider contact [facility].  They will refer you to a provider in your community: ? ?[facility] ?Office: [phone] ?24 Hour Crisis: [phone] ? ? ?

## 2022-02-10 NOTE — ED Notes (Addendum)
TTS at bedside. 

## 2022-02-10 NOTE — ED Provider Notes (Signed)
9:04 AM ?Patient evaluated myself.  Reports that he has a cardiologist appointment at 10 AM and needs to go.  He denies current suicidal homes ideation.  He is not psychotic.  He is cooperative with exam.  He is able to contract for safety.  Does not pose a threat to himself or other people.  He will return if symptoms recur.  Reports he was temporarily upset and had passive suicidal thoughts but they have passed.  No plan. ? ?Psychiatry did recommend re-eval in the morning, they have been unable to see him yet. He is voluntary. He is not psychotic or suicidal, not intoxicated. Feels better.  ? ?Will ama ? ?The patient has requested to leave the ED against medical advice. I believe this patient is of sound mind and medical decision making capacity to refuse medical care. The patient is responding and asking questions appropriately. The patient is oriented to person, place and time. The patient is not psychotic, delusional, suicidal, homicidal or hallucinating. The patient demonstrates a normal mental capacity to make decisions regarding their healthcare. The patient is clinically sober and does not appear to be under the influence of any illicit drugs at this time. The patient has been advised of the risks, in layman terms, of leaving AMA which include, but are not limited to death, coma, permanent disability, loss of current lifestyle, delay in diagnosis. Alternatives have been offered - the patient remains steadfast in their wish to leave. The patient has been advised that should they change their mind they are welcome to return to this hospital, or any other, at any time. The patient understands that in no way does an AMA discharge mean that I do not want them to have the best medical care available. To this end, I have offered appropriate prescriptions, referrals, and discharge instructions. The patient did sign AMA paperwork. The above discussion was witnessed by another member of staff. ? ? ?  ?Sloan Leiter,  DO ?02/10/22 [phone] ? ?

## 2022-02-10 NOTE — BH Assessment (Signed)
Comprehensive Clinical Assessment (CCA) Note ? ?02/10/2022 ?William Powers ?[id number] ? ?DISPOSITION: Gave clinical report to Roselyn BeringShalon Bobbitt, NP who recommended Pt be observed overnight and evaluated by psychiatry in the morning. Notified Dr. Zadie Rhineonald Wickline and Carlton AdamBrittany Huneycutt, RN of recommendation via secure message. ? ?The patient demonstrates the following risk factors for suicide: Chronic risk factors for suicide include: psychiatric disorder of major depressive disorder, substance use disorder, and medical illness CHF, HIV, asthma . Acute risk factors for suicide include: social withdrawal/isolation and recent discharge from inpatient psychiatry. Protective factors for this patient include: hope for the future. Considering these factors, the overall suicide risk at this point appears to be low. Patient is appropriate for outpatient follow up. ? ?Flowsheet Row ED from [facility]09/2022 [facility]  ?C-SSRS RISK CATEGORY No Risk No Risk No Risk  ? ?  ? ?Pt is a 41 year old male who [facility]accompan[facility] reporting SOB, depressive symptoms, and suicidal ideation. He has a history of multiple presentations to EDs with mental health symptoms. He was very drowsy during assessment and was unable to give detailed information. He says he is depressed due to his chronic medical conditions, which include CHF, HIV, anemia. He states he has felt severely depressed for the past 10 days. He told EDP "my life is just awful".  He reports recurring suicidal ideation with no plan. He denies history of suicide attempts. Pt denies any history of intentional self-injurious behaviors. He describes his appetite and "on and off." He says he is sleeping more than normal and feels exhausted. Pt denies current homicidal ideation or history of  violence. Pt denies any history of auditory or visual hallucinations.  ? ?Pt has a history of using alcohol and substances. He endorses using "a good amount" of alcohol daily and Pt's BAL=77. He reports using marijuana "sometimes" and denies use of other substances. ? ?Pt says he has a permanent residence and that he lives alone. He is on disability and receives SSI. He denies legal problems. He denies access to firearms. Pt says he has no outpatient mental health providers. He says he was recently psychiatrically hospitalized in [place]. ? ?Pt is covered by a blanket, drowsy, and had to be roused several times. He is oriented x4. Pt speaks in a mumbled tone, at low volume and normal pace. Motor behavior appears normal. Eye contact is minimal. Pt's mood is depressed and affect is congruent with mood. Thought process is coherent and relevant. There is no indication Pt is currently responding to internal stimuli or experiencing delusional thought content. He is minimally cooperative. ? ? ?Chief Complaint:  ?Chief Complaint  ?Patient presents with  ? Shortness of Breath  ? ?Visit Diagnosis:  ?F33.2 Major depressive disorder, Recurrent episode, Severe ? ? ? ?CCA Screening, Triage and Referral (STR) ? ?Patient Reported Information ?How did you hear about us? Self ? ?What Is the Reason for Your Visit/Call Today? Pt states came to the ED due to SOB and because he as felt severe depressed for the past 10 days. He reports suicidal ideation with no specific plan. ? ?How Long Has This Been Causing You Problems? 1 wk - 1 month ? ?What Do You Feel Would Help You the Most Today? Treatment for Depression or other mood problem; Medication(s) ? ? ?Have You Recently Had Any Thoughts About Hurting Yourself? Yes ? ?Are  You Planning to Commit Suicide/Harm Yourself At This time? No ? ? ?Have you Recently Had Thoughts About Hurting Someone [name]? No ? ?Are You Planning to Harm Someone at This Time? No ? ?Explanation: No data  recorded ? ?Have You Used Any Alcohol or Drugs in the Past 24 Hours? Yes ? ?How Long Ago Did You Use Drugs or Alcohol? No data recorded ?What Did You Use and How Much? unknown amount of alcohol ? ? ?Do You Currently Have a Therapist/Psychiatrist? No ? ?Name of Therapist/Psychiatrist: No data recorded ? ?Have You Been Recently Discharged From Any Office Practice or Programs? No ? ?Explanation of Discharge From Practice/Program: No data recorded ? ?  ?CCA Screening Triage Referral Assessment ?Type of Contact: Tele-Assessment ? ?Telemedicine Service Delivery: Telemedicine service delivery: This service was provided via telemedicine using a 2-way, interactive audio and video technology ? ?Is this Initial or Reassessment? Initial Assessment ? ?Date Telepsych consult ordered in [facility]:  02/10/22 ? ?Time Telepsych consult ordered in [facility]:  0108 ? ?Location of Assessment: [facility] ED ? ?Provider Location: [facility] Assessment Services ? ? ?Collateral Involvement: Pt denies having supports. Pt declined from clinician to call anyone to obtain additional information. ? ? ?Does Patient Have a Automotive engineer Guardian? No data recorded ?Name and Contact of Legal Guardian: No data recorded ?If Minor and Not Living with Parent(s), Who has Custody? NA ? ?Is CPS involved or ever been involved? Never ? ?Is APS involved or ever been involved? Never ? ? ?Patient Determined To Be At Risk for Harm To Self or Others Based on Review of Patient Reported Information or Presenting Complaint? Yes, for Self-Harm ? ?Method: No data recorded ?Availability of Means: No data recorded ?Intent: No data recorded ?Notification Required: No data recorded ?Additional Information for Danger to Others Potential: No data recorded ?Additional Comments for Danger to Others Potential: No data recorded ?Are There Guns or Other Weapons in Your Home? No data recorded ?Types of Guns/Weapons: No data recorded ?Are These Weapons Safely Secured?                             No data recorded ?Who Could Verify You Are Able To Have These Secured: No data recorded ?Do You Have any Outstanding Charges, Pending Court Dates, Parole/Probation? No data recorded ?Contacted To Inform of Risk of Harm To Self or Others: Unable to Contact: ? ? ? ?Does Patient Present under Involuntary Commitment? Yes ? ?IVC Papers Initial File Date: No data recorded ? ?[place] of Residence: Other (Comment) [facility]) ? ? ?Patient Currently Receiving the Following Services: Not Receiving Services ? ? ?Determination of Need: Emergent (2 hours) ? ? ?Options For Referral: Inpatient Hospitalization; Facility-Based Crisis; Outpatient Therapy; Medication Management ? ? ? ? ?CCA Biopsychosocial ?Patient Reported Schizophrenia/Schizoaffective Diagnosis in Past: No ? ? ?Strengths: Pt is seeking treatment ? ? ?Mental Health Symptoms ?Depression:   ?Change in energy/activity; Difficulty Concentrating; Fatigue; Hopelessness; Sleep (too much or little) ?  ?Duration of Depressive symptoms:  ?Duration of Depressive Symptoms: Greater than two weeks ?  ?Mania:   ?None ?  ?Anxiety:    ?Worrying; Tension; Restlessness ?  ?Psychosis:   ?None ?  ?Duration of Psychotic symptoms:    ?Trauma:   ?None ?  ?Obsessions:   ?None ?  ?Compulsions:   ?None ?  ?Inattention:   ?N/A ?  ?Hyperactivity/Impulsivity:   ?N/A ?  ?Oppositional/Defiant Behaviors:   ?N/A ?  ?Emotional Irregularity:   ?  Recurrent suicidal behaviors/gestures/threats ?  ?Other Mood/Personality Symptoms:   ?None ?  ? ?Mental Status Exam ?Appearance and self-care  ?Stature:   ?Average ?  ?Weight:   ?Average weight ?  ?Clothing:   ?-- (Scrubs) ?  ?Grooming:   ?Normal ?  ?Cosmetic use:   ?None ?  ?Posture/gait:   ?Normal ?  ?Motor activity:   ?Not Remarkable ?  ?Sensorium  ?Attention:   ?Normal ?  ?Concentration:   ?Normal ?  ?Orientation:   ?X5 ?  ?Recall/memory:   ?Normal ?  ?Affect and Mood  ?Affect:   ?Depressed ?  ?Mood:   ?Depressed ?  ?Relating  ?Eye contact:   ?Avoided;  Fleeting ?  ?Facial expression:   ?-- (Pt had his eyes closed mostly during the assessment.) ?  ?Attitude toward examiner:   ?Uninterested ?  ?Thought and Language  ?Speech flow:  ?Paucity ?  ?Thought content:   ?Appropriate to [facility]

## 2022-02-11 MED ORDER — ALBUTEROL SULFATE HFA 108 (90 BASE) MCG/ACT IN AERS
2.0000 | INHALATION_SPRAY | Freq: Once | RESPIRATORY_TRACT | Status: AC
  Administered 2022-02-11: 2 via RESPIRATORY_TRACT
  Filled 2022-02-11: qty 6.7

## 2022-02-11 NOTE — ED Notes (Addendum)
Patient was called x5 no answer when called ?

## 2022-02-11 NOTE — Discharge Instructions (Signed)
You were seen today for shortness of breath.  You were recently evaluated for the same and had a negative chest x-ray.  You will be given an inhaler given your history of asthma.  Your oxygen levels are normal.  Make sure to use your inhaler as needed ?

## 2022-02-11 NOTE — ED Provider Notes (Signed)
?[facility] ?Provider Note ? ? ?CSN: [id number] ?Arrival date & time: 02/10/22  2318 ? ?  ? ?History ? ?Chief Complaint  ?Patient presents with  ? Shortness of Breath  ? ? ?William Powers is a 41 y.o. male. ? ?HPI ? ?  ? ?This is a 41 year old male who presents with complaints of shortness of breath.  He initially stated that he just needed an inhaler.  He has a history of asthma.  He was seen and evaluated yesterday for the same.  At that time he had a EKG and a chest x-ray which were reassuring.  He was unable to pick up his inhaler because "Medicaid did not cover it."  He denies chest pain.  Denies lower extremity swelling.  Patient is well-known to our emergency department. ? ?Home Medications ?Prior to Admission medications   ?Medication Sig Start Date End Date Taking? Authorizing Provider  ?BIKTARVY 50-200-25 MG TABS tablet Take 1 tablet by mouth daily. 11/18/21   Deno Etienne, DO  ?docusate sodium (COLACE) 100 MG capsule Take 1 capsule (100 mg total) by mouth every 12 (twelve) hours. ?Patient not taking: Reported on 09/26/2021 08/31/21   Blanchie Dessert, MD  ?eplerenone (INSPRA) 25 MG tablet Take 1 tablet (25 mg total) by mouth daily. 09/24/21   Deno Etienne, DO  ?furosemide (LASIX) 40 MG tablet Take 1 and 1/2 tablets by mouth daily. ?Patient taking differently: Take 60 mg by mouth daily as needed for fluid or edema. 09/24/21 10/24/21  Deno Etienne, DO  ?hydrocortisone-pramoxine (PROCTOFOAM HC) rectal foam Place 1 applicatorful rectally 2 times daily. ?Patient not taking: Reported on 09/26/2021 09/24/21   Deno Etienne, DO  ?metoprolol succinate (TOPROL-XL) 50 MG 24 hr tablet Take 1 tablet by mouth 2 times daily. 11/18/21   Deno Etienne, DO  ?potassium chloride SA (KLOR-CON) 20 MEQ tablet Take 1 tablet (20 mEq total) by mouth daily at 6 (six) AM. ?Patient not taking: Reported on 09/09/2021 09/05/21   Carlisle Cater, PA-C  ?rivaroxaban (XARELTO) 20 MG TABS tablet Take 1 tablet by mouth  daily with supper. ?Patient not taking: Reported on 09/26/2021 09/24/21   Deno Etienne, DO  ?Potassium Chloride ER 20 MEQ TBCR Take 20 mEq by mouth daily at 6 (six) AM. ?Patient not taking: No sig reported 08/07/21 09/05/21  Reubin Milan, MD  ?   ? ?Allergies    ?Bee venom, Lisinopril, and Sulfamethoxazole-trimethoprim   ? ?Review of Systems   ?Review of Systems  ?Constitutional:  Negative for fever.  ?Respiratory:  Positive for shortness of breath. Negative for cough.   ?Cardiovascular:  Negative for chest pain.  ?Gastrointestinal:  Negative for abdominal pain.  ?All other systems reviewed and are negative. ? ?Physical Exam ?Updated Vital Signs ?BP 134/70 (BP Location: Right Arm)   Pulse (!) 110   Temp 98.4 ?F (36.9 ?C) (Oral)   Resp 16   Ht 1.93 m (6\' 4" )   Wt 112 kg   SpO2 99%   BMI 30.07 kg/m?  ?Physical Exam ?Vitals and nursing note reviewed.  ?Constitutional:   ?   Appearance: He is well-developed.  ?   Comments: Sleeping comfortably on my arrival  ?HENT:  ?   Head: Normocephalic and atraumatic.  ?Eyes:  ?   Pupils: Pupils are equal, round, and reactive to light.  ?Cardiovascular:  ?   Rate and Rhythm: Normal rate and regular rhythm.  ?   Heart sounds: Normal heart sounds. No murmur heard. ?Pulmonary:  ?  Effort: Pulmonary effort is normal. No respiratory distress.  ?   Breath sounds: Normal breath sounds. No wheezing.  ?Abdominal:  ?   General: Bowel sounds are normal.  ?   Palpations: Abdomen is soft.  ?   Tenderness: There is no abdominal tenderness. There is no rebound.  ?Musculoskeletal:  ?   Cervical back: Neck supple.  ?   Right lower leg: No edema.  ?   Left lower leg: No edema.  ?Lymphadenopathy:  ?   Cervical: No cervical adenopathy.  ?Skin: ?   General: Skin is warm and dry.  ?Neurological:  ?   Mental Status: He is alert and oriented to person, place, and time.  ?Psychiatric:     ?   Mood and Affect: Mood normal.  ? ? ?ED Results / Procedures / Treatments   ?Labs ?(all labs ordered are  listed, but only abnormal results are displayed) ?Labs Reviewed - No data to display ? ?EKG ?EKG Interpretation ? ?Date/Time:  Tuesday February 11 2022 05:53:36 EDT ?Ventricular Rate:  94 ?PR Interval:  162 ?QRS Duration: 99 ?QT Interval:  352 ?QTC Calculation: 441 ?R Axis:   118 ?Text Interpretation: Sinus rhythm Probable lateral infarct, age indeterminate Borderline ST elevation, anterior leads No significant change since last tracing Confirmed by Thayer Jew [phone]) on 02/11/2022 5:55:18 AM ? ?Radiology ?DG Chest 2 View ? ?Result Date: 02/10/2022 ?CLINICAL DATA:  Coughing and shortness of breath. EXAM: CHEST - 2 VIEW COMPARISON:  PA Lat 12/16/2021. FINDINGS: The heart is slightly enlarged. No vascular congestion is seen. The mediastinal configuration is stable and there is mild chronic elevation right hemidiaphragm. The lungs are clear. No pleural effusion is evident. Thoracic cage grossly intact. IMPRESSION: No evidence of acute chest disease. Stable chest with slight cardiomegaly. Electronically Signed   By: Telford Nab M.D.   On: 02/10/2022 00:23   ? ?Procedures ?Procedures  ? ? ?Medications Ordered in ED ?Medications  ?albuterol (VENTOLIN HFA) 108 (90 Base) MCG/ACT inhaler 2 puff (has no administration in time range)  ? ? ?ED Course/ Medical Decision Making/ A&P ?  ?                        ?Medical Decision Making ?Risk ?Prescription drug management. ? ? ?This patient presents to the ED for concern of shortness of breath, needing inhaler, this involves an extensive number of treatment options, and is a complaint that carries with it a high risk of complications and morbidity.  The differential diagnosis includes asthma, COPD, pneumonia, PE ? ?MDM:   ? ?This 41 year old male who presents with request for an inhaler.  Patient reports he has been short of breath.  Was seen and evaluated for the same yesterday.  Had a negative work-up at that time.  On my evaluation he is sleeping.  He has no active wheezing.   He is hemodynamically stable with O2 sats of 99%.  EKG shows no evidence of acute ischemia or arrhythmia.  He is not tachycardic.  Patient was provided with an inhaler.  Do not feel he needs further work-up at this time.  Patient does have a history of PE.  Reports compliance with her Xarelto.  He is not hemodynamically unstable and his O2 sats are 99%.  Do not feel he needs repeat imaging at this time. ?(Labs, imaging) ? ?Labs: ?I Ordered, and personally interpreted labs.  The pertinent results include: Labs reviewed from previous visit and reassured ? ?Imaging Studies  ordered: ?I ordered imaging studies including x-ray reviewed from 4/23 without pneumonia or pneumothorax ?I independently visualized and interpreted imaging. ?I agree with the radiologist interpretation ? ?Additional history obtained from chart review.  External records from outside source obtained and reviewed including prior evaluations ? ?Critical Interventions: ?Inhaler ? ?Consultations: ?I requested consultation with the NA,  and discussed lab and imaging findings as well as pertinent plan - they recommend: N/A ? ?Cardiac Monitoring: ?The patient was maintained on a cardiac monitor.  I personally viewed and interpreted the cardiac monitored which showed an underlying rhythm of: Normal sinus rhythm ? ?Reevaluation: ?After the interventions noted above, I reevaluated the patient and found that they have :stayed the same ? ? ?Considered admission for: N/A ? ?Social Determinants of Health: ?Poor social situation, recurrent ED visit ? ?Disposition: Discharge ? ?Co morbidities that complicate the patient evaluation ? ?Past Medical History:  ?Diagnosis Date  ? Heart failure ([place])   ? HFrEF (heart failure with reduced ejection fraction) ([place])   ? 15% EF as of 2019  ? HIV (human immunodeficiency virus infection) ([place])   ? Hypertension   ? Polysubstance abuse ([place])   ? Pulmonary embolism ([place])   ?  ? ?Medicines ?Meds ordered this encounter  ?Medications   ? albuterol (VENTOLIN HFA) 108 (90 Base) MCG/ACT inhaler 2 puff  ?  ?I have reviewed the patients home medicines and have made adjustments as needed ? ?Problem List / ED Course: ?Problem List Items Addressed

## 2022-02-23 ENCOUNTER — Emergency Department (HOSPITAL_COMMUNITY)
Admission: EM | Admit: 2022-02-23 | Discharge: 2022-02-24 | Discharge status: Against Medical Advice | Payer: Medicaid Other | Attending: Emergency Medicine | Admitting: Emergency Medicine

## 2022-02-23 ENCOUNTER — Other Ambulatory Visit: Payer: Self-pay

## 2022-02-23 ENCOUNTER — Encounter (HOSPITAL_COMMUNITY): Payer: Self-pay

## 2022-02-23 DIAGNOSIS — F29 Unspecified psychosis not due to a substance or known physiological condition: Secondary | ICD-10-CM | POA: Diagnosis not present

## 2022-02-23 DIAGNOSIS — Z21 Asymptomatic human immunodeficiency virus [HIV] infection status: Secondary | ICD-10-CM | POA: Diagnosis not present

## 2022-02-23 DIAGNOSIS — Y908 Blood alcohol level of 240 mg/100 ml or more: Secondary | ICD-10-CM | POA: Insufficient documentation

## 2022-02-23 DIAGNOSIS — I509 Heart failure, unspecified: Secondary | ICD-10-CM | POA: Insufficient documentation

## 2022-02-23 DIAGNOSIS — R45851 Suicidal ideations: Secondary | ICD-10-CM | POA: Insufficient documentation

## 2022-02-23 DIAGNOSIS — E876 Hypokalemia: Secondary | ICD-10-CM | POA: Diagnosis not present

## 2022-02-23 DIAGNOSIS — Z5321 Procedure and treatment not carried out due to patient leaving prior to being seen by health care provider: Secondary | ICD-10-CM | POA: Diagnosis not present

## 2022-02-23 DIAGNOSIS — Z79899 Other long term (current) drug therapy: Secondary | ICD-10-CM | POA: Insufficient documentation

## 2022-02-23 DIAGNOSIS — F331 Major depressive disorder, recurrent, moderate: Secondary | ICD-10-CM | POA: Diagnosis present

## 2022-02-23 DIAGNOSIS — F332 Major depressive disorder, recurrent severe without psychotic features: Secondary | ICD-10-CM | POA: Diagnosis present

## 2022-02-23 DIAGNOSIS — I110 Hypertensive heart disease with heart failure: Secondary | ICD-10-CM | POA: Diagnosis not present

## 2022-02-23 DIAGNOSIS — Z20822 Contact with and (suspected) exposure to covid-19: Secondary | ICD-10-CM | POA: Diagnosis not present

## 2022-02-23 DIAGNOSIS — F22 Delusional disorders: Secondary | ICD-10-CM | POA: Insufficient documentation

## 2022-02-23 DIAGNOSIS — F1010 Alcohol abuse, uncomplicated: Secondary | ICD-10-CM | POA: Diagnosis not present

## 2022-02-23 LAB — CBC WITH DIFFERENTIAL/PLATELET
Abs Immature Granulocytes: 0.02 10*3/uL (ref 0.00–0.07)
Basophils Absolute: 0 10*3/uL (ref 0.0–0.1)
Basophils Relative: 1 %
Eosinophils Absolute: 0.3 10*3/uL (ref 0.0–0.5)
Eosinophils Relative: 6 %
HCT: 38.1 % — ABNORMAL LOW (ref 39.0–52.0)
Hemoglobin: 13.1 g/dL (ref 13.0–17.0)
Immature Granulocytes: 0 %
Lymphocytes Relative: 46 %
Lymphs Abs: 2.2 10*3/uL (ref 0.7–4.0)
MCH: 32.3 pg (ref 26.0–34.0)
MCHC: 34.4 g/dL (ref 30.0–36.0)
MCV: 94.1 fL (ref 80.0–100.0)
Monocytes Absolute: 0.5 10*3/uL (ref 0.1–1.0)
Monocytes Relative: 10 %
Neutro Abs: 1.8 10*3/uL (ref 1.7–7.7)
Neutrophils Relative %: 37 %
Platelets: 254 10*3/uL (ref 150–400)
RBC: 4.05 MIL/uL — ABNORMAL LOW (ref 4.22–5.81)
RDW: 15.5 % (ref 11.5–15.5)
WBC: 4.9 10*3/uL (ref 4.0–10.5)
nRBC: 0 % (ref 0.0–0.2)

## 2022-02-23 LAB — COMPREHENSIVE METABOLIC PANEL
ALT: 36 U/L (ref 0–44)
AST: 38 U/L (ref 15–41)
Albumin: 3.5 g/dL (ref 3.5–5.0)
Alkaline Phosphatase: 60 U/L (ref 38–126)
Anion gap: 8 (ref 5–15)
BUN: 12 mg/dL (ref 6–20)
CO2: 23 mmol/L (ref 22–32)
Calcium: 8.5 mg/dL — ABNORMAL LOW (ref 8.9–10.3)
Chloride: 108 mmol/L (ref 98–111)
Creatinine, Ser: 0.83 mg/dL (ref 0.61–1.24)
GFR, Estimated: >60 mL/min (ref >60)
Glucose, Bld: 105 mg/dL — ABNORMAL HIGH (ref 70–99)
Potassium: 3.2 mmol/L — ABNORMAL LOW (ref 3.5–5.1)
Sodium: 139 mmol/L (ref 135–145)
Total Bilirubin: 0.7 mg/dL (ref 0.3–1.2)
Total Protein: 8 g/dL (ref 6.5–8.1)

## 2022-02-23 LAB — RAPID URINE DRUG SCREEN, HOSP PERFORMED
Amphetamines: NOT DETECTED
Barbiturates: NOT DETECTED
Benzodiazepines: NOT DETECTED
Cocaine: NOT DETECTED
Opiates: NOT DETECTED
Tetrahydrocannabinol: POSITIVE — AB

## 2022-02-23 LAB — SALICYLATE LEVEL: Salicylate Lvl: <7 mg/dL — ABNORMAL LOW (ref 7.0–30.0)

## 2022-02-23 LAB — ACETAMINOPHEN LEVEL: Acetaminophen (Tylenol), Serum: <10 ug/mL — ABNORMAL LOW (ref 10–30)

## 2022-02-23 LAB — ETHANOL: Alcohol, Ethyl (B): 240 mg/dL — ABNORMAL HIGH (ref <10)

## 2022-02-23 MED ORDER — BICTEGRAVIR-EMTRICITAB-TENOFOV 50-200-25 MG PO TABS
1.0000 | ORAL_TABLET | Freq: Every day | ORAL | Status: DC
  Administered 2022-02-23: 1 via ORAL
  Filled 2022-02-23 (×2): qty 1

## 2022-02-23 MED ORDER — SERTRALINE HCL 50 MG PO TABS
25.0000 mg | ORAL_TABLET | Freq: Every day | ORAL | Status: DC
  Administered 2022-02-23: 25 mg via ORAL
  Filled 2022-02-23: qty 1

## 2022-02-23 MED ORDER — FUROSEMIDE 40 MG PO TABS: 60.0000 mg | ORAL_TABLET | Freq: Every day | ORAL | Status: DC | PRN

## 2022-02-23 MED ORDER — POTASSIUM CHLORIDE CRYS ER 20 MEQ PO TBCR
40.0000 meq | EXTENDED_RELEASE_TABLET | Freq: Once | ORAL | Status: AC
  Administered 2022-02-23: 40 meq via ORAL
  Filled 2022-02-23: qty 2

## 2022-02-23 MED ORDER — HYDROXYZINE HCL 25 MG PO TABS
50.0000 mg | ORAL_TABLET | Freq: Once | ORAL | Status: AC
  Administered 2022-02-23: 50 mg via ORAL
  Filled 2022-02-23: qty 2

## 2022-02-23 NOTE — Consult Note (Signed)
[facility] Face-to-Face Psychiatry Consult  ? ?Reason for Consult:  Psychiatric evaluation ?Referring Physician:  ED Physician ?Patient Identification: William Powers ?MRN:  [id number] ?Principal Diagnosis: Recurrent major depression-severe (HCC) ?Diagnosis:  Principal Problem: ?  Recurrent major depression-severe (HCC) ? ? ?Total Time spent with patient: 30 minutes ? ?Subjective:   ?William Powers is a 41 y.o. male patient admitted with hx .Depression, Anxiety, Alcohol dependence, Cocaine abuse, Cannabis abuse. ? ?HPI:  a 41 y.o. male patient admitted with hx .Depression, Anxiety, Alcohol dependence, Cocaine abuse, Cannabis abuse.  Patient was brought in by [facility] after he called 911 for feeling suicidal.  Patient was seen this morning and he did not make eye contact  but reported feeling hopeless, helpless and suicidal.  His triggers are homelessness, inability to stop using Alcohol and drugs.  Patient reported drinking 2 pints of Liquor yesterday and alcohol level came back 240.  He last used Cocaine a Month ago he says.  Patient has multiple medical issues including HIVas well.  He does not take any Psychotropic medication, does not have outpatient Psychiatric provider.  Patient denied drinking heavily since his Teenage years and denied  Alcohol withdrawal seizures.  He is still suicidal this morning and stated that his plan is to drink  Bleach to kill himself.  He denied HI/AVH and no mention of Paranoia.  We will obtain EKG and start low dose Sertraline. ? ?Past Psychiatric History: Depression, Anxiety, Alcohol dependence, Cocaine abuse, Cannabis abuse. ? ?Risk to Self:   ?Risk to Others:   ?Prior Inpatient Therapy:   ?Prior Outpatient Therapy:   ? ?Past Medical History:  ?Past Medical History:  ?Diagnosis Date  ? Heart failure (HCC)   ? HFrEF (heart failure with reduced ejection fraction) (HCC)   ? 15% EF as of 2019  ? HIV (human immunodeficiency virus infection) (HCC)   ? Hypertension   ? Polysubstance abuse  (HCC)   ? Pulmonary embolism (HCC)   ?  ?Past Surgical History:  ?Procedure Laterality Date  ? HERNIA REPAIR    ? ?Family History:  ?Family History  ?Problem Relation Age of Onset  ? Lupus Mother   ? HIV Mother   ? HIV Father   ? ?Family Psychiatric  History: Denies  ?Social History:  ?Social History  ? ?Substance and Sexual Activity  ?Alcohol Use Yes  ?   ?Social History  ? ?Substance and Sexual Activity  ?Drug Use Yes  ? Types: Marijuana  ?  ?Social History  ? ?Socioeconomic History  ? Marital status: Divorced  ?  Spouse name: Not on file  ? Number of children: Not on file  ? Years of education: Not on file  ? Highest education level: Not on file  ?Occupational History  ? Not on file  ?Tobacco Use  ? Smoking status: Some Days  ?  Types: Cigarettes  ? Smokeless tobacco: Never  ?Vaping Use  ? Vaping Use: Never used  ?Substance and Sexual Activity  ? Alcohol use: Yes  ? Drug use: Yes  ?  Types: Marijuana  ? Sexual activity: Not on file  ?Other Topics Concern  ? Not on file  ?Social History Narrative  ? Not on file  ? ?Social Determinants of Health  ? ?Financial Resource Strain: Not on file  ?Food Insecurity: Not on file  ?Transportation Needs: Not on file  ?Physical Activity: Not on file  ?Stress: Not on file  ?Social Connections: Not on file  ? ?Additional Social History: ?  ? ?  Allergies:   ?Allergies  ?Allergen Reactions  ? Bee Venom Anaphylaxis  ? Lisinopril Swelling  ?  Reported on 5/14 by him to his my chart account : was in ED for swollen lips  ?  ? Sulfamethoxazole-Trimethoprim Rash and Swelling  ? ? ?Labs:  ?Results for orders placed or performed during the hospital encounter of 02/23/22 (from the past 48 hour(s))  ?Comprehensive metabolic panel     Status: Abnormal  ? Collection Time: 02/23/22  3:23 AM  ?Result Value Ref Range  ? Sodium 139 135 - 145 mmol/L  ? Potassium 3.2 (L) 3.5 - 5.1 mmol/L  ? Chloride 108 98 - 111 mmol/L  ? CO2 23 22 - 32 mmol/L  ? Glucose, Bld 105 (H) 70 - 99 mg/dL  ?  Comment: Glucose  reference range applies only to samples taken after fasting for at least 8 hours.  ? BUN 12 6 - 20 mg/dL  ? Creatinine, Ser 0.83 0.61 - 1.24 mg/dL  ? Calcium 8.5 (L) 8.9 - 10.3 mg/dL  ? Total Protein 8.0 6.5 - 8.1 g/dL  ? Albumin 3.5 3.5 - 5.0 g/dL  ? AST 38 15 - 41 U/L  ? ALT 36 0 - 44 U/L  ? Alkaline Phosphatase 60 38 - 126 U/L  ? Total Bilirubin 0.7 0.3 - 1.2 mg/dL  ? GFR, Estimated >60 >60 mL/min  ?  Comment: (NOTE) ?Calculated using the CKD-EPI Creatinine Equation (2021) ?  ? Anion gap 8 5 - 15  ?  Comment: Performed at [facility], [address] [place], [place] [address]  ?Ethanol     Status: Abnormal  ? Collection Time: 02/23/22  3:23 AM  ?Result Value Ref Range  ? Alcohol, Ethyl (B) 240 (H) <10 mg/dL  ?  Comment: (NOTE) ?Lowest detectable limit for serum alcohol is 10 mg/dL. ? ?For medical purposes only. ?Performed at [facility], [address] ?[place], [place] [address] ?  ?CBC with Diff     Status: Abnormal  ? Collection Time: 02/23/22  3:23 AM  ?Result Value Ref Range  ? WBC 4.9 4.0 - 10.5 K/uL  ? RBC 4.05 (L) 4.22 - 5.81 MIL/uL  ? Hemoglobin 13.1 13.0 - 17.0 g/dL  ? HCT 38.1 (L) 39.0 - 52.0 %  ? MCV 94.1 80.0 - 100.0 fL  ? MCH 32.3 26.0 - 34.0 pg  ? MCHC 34.4 30.0 - 36.0 g/dL  ? RDW 15.5 11.5 - 15.5 %  ? Platelets 254 150 - 400 K/uL  ? nRBC 0.0 0.0 - 0.2 %  ? Neutrophils Relative % 37 %  ? Neutro Abs 1.8 1.7 - 7.7 K/uL  ? Lymphocytes Relative 46 %  ? Lymphs Abs 2.2 0.7 - 4.0 K/uL  ? Monocytes Relative 10 %  ? Monocytes Absolute 0.5 0.1 - 1.0 K/uL  ? Eosinophils Relative 6 %  ? Eosinophils Absolute 0.3 0.0 - 0.5 K/uL  ? Basophils Relative 1 %  ? Basophils Absolute 0.0 0.0 - 0.1 K/uL  ? Immature Granulocytes 0 %  ? Abs Immature Granulocytes 0.02 0.00 - 0.07 K/uL  ?  Comment: Performed at [facility], [address] [place], [place] [address]  ?Acetaminophen level     Status: Abnormal  ? Collection Time: 02/23/22  3:23 AM  ?Result Value Ref  Range  ? Acetaminophen (Tylenol), Serum <10 (L) 10 - 30 ug/mL  ?  Comment: (NOTE) ?Therapeutic concentrations vary significantly. A range of 10-30 ug/mL  ?  may be an effective concentration for many patients. However, some  ?are best treated at concentrations outside of this range. ?Acetaminophen concentrations >150 ug/mL at 4 hours after ingestion  ?and >50 ug/mL at 12 hours after ingestion are often associated with  ?toxic reactions. ? ?Performed at [facility], [address] ?[place], [place] [address] ?  ?Salicylate level     Status: Abnormal  ? Collection Time: 02/23/22  3:23 AM  ?Result Value Ref Range  ? Salicylate Lvl <7.0 (L) 7.0 - 30.0 mg/dL  ?  Comment: Performed at [facility], [address], [place], [place] [address]  ? ? ?Current Facility-Administered Medications  ?Medication Dose Route Frequency Provider Last Rate Last Admin  ? bictegravir-emtricitabine-tenofovir AF (BIKTARVY) 50-200-25 MG per tablet 1 tablet  1 tablet Oral Daily Army Melia A, PA-C      ? furosemide (LASIX) tablet 60 mg  60 mg Oral Daily PRN Army Melia A, PA-C      ? sertraline (ZOLOFT) tablet 25 mg  25 mg Oral Daily Earney Navy, NP      ? ?Current Outpatient Medications  ?Medication Sig Dispense Refill  ? BIKTARVY 50-200-25 MG TABS tablet Take 1 tablet by mouth daily. 30 tablet 0  ? docusate sodium (COLACE) 100 MG capsule Take 1 capsule (100 mg total) by mouth every 12 (twelve) hours. (Patient not taking: Reported on 09/26/2021) 60 capsule 0  ? eplerenone (INSPRA) 25 MG tablet Take 1 tablet (25 mg total) by mouth daily. 30 tablet 0  ? furosemide (LASIX) 40 MG tablet Take 1 and 1/2 tablets by mouth daily. (Patient taking differently: Take 60 mg by mouth daily as needed for fluid or edema.) 45 tablet 0  ? hydrocortisone-pramoxine (PROCTOFOAM HC) rectal foam Place 1 applicatorful rectally 2 times daily. (Patient not taking: Reported on 09/26/2021) 10 g 0  ? metoprolol succinate  (TOPROL-XL) 50 MG 24 hr tablet Take 1 tablet by mouth 2 times daily. 60 tablet 0  ? potassium chloride SA (KLOR-CON) 20 MEQ tablet Take 1 tablet (20 mEq total) by mouth daily at 6 (six) AM. (Patient not ta

## 2022-02-23 NOTE — BH Assessment (Signed)
Per Gayleen Orem, RN note: "He's sleeping realllly good and was difficult to arouse so we might have to wait for a couple more hours." Clinician asked RN to let her know when the pt is able to engage in the TTS assessment.  ? ? ?Redmond Pulling, MS, [facility], CRC ?Triage Specialist ?[phone] ? ?

## 2022-02-23 NOTE — ED Notes (Signed)
Pt took drowsy to complete TTS assessment at this time. ?

## 2022-02-23 NOTE — ED Provider Notes (Signed)
?[facility]-EMERGENCY DEPT ?Provider Note ? ? ?CSN: [id number] ?Arrival date & time: 02/23/22  0252 ? ?  ? ?History ? ?Chief Complaint  ?Patient presents with  ? Suicidal  ? ? ?William Powers is a 41 y.o. male. ? ?41 year old male with past medical history of hypertension, HIV, polysubstance abuse, heart failure (EF 55-60% as of 2022), PE.  Brought in by police for suicidal ideation tonight.  Patient states the demons are out side are going to kill him.  Reports drinking a large quantity of beer and liquor tonight, denies drug use.  Denies complaints or concerns otherwise. ? ? ?  ? ?Home Medications ?Prior to Admission medications   ?Medication Sig Start Date End Date Taking? Authorizing Provider  ?BIKTARVY 50-200-25 MG TABS tablet Take 1 tablet by mouth daily. 11/18/21   Melene Plan, DO  ?docusate sodium (COLACE) 100 MG capsule Take 1 capsule (100 mg total) by mouth every 12 (twelve) hours. ?Patient not taking: Reported on 09/26/2021 08/31/21   Gwyneth Sprout, MD  ?eplerenone (INSPRA) 25 MG tablet Take 1 tablet (25 mg total) by mouth daily. 09/24/21   Melene Plan, DO  ?furosemide (LASIX) 40 MG tablet Take 1 and 1/2 tablets by mouth daily. ?Patient taking differently: Take 60 mg by mouth daily as needed for fluid or edema. 09/24/21 10/24/21  Melene Plan, DO  ?hydrocortisone-pramoxine (PROCTOFOAM HC) rectal foam Place 1 applicatorful rectally 2 times daily. ?Patient not taking: Reported on 09/26/2021 09/24/21   Melene Plan, DO  ?metoprolol succinate (TOPROL-XL) 50 MG 24 hr tablet Take 1 tablet by mouth 2 times daily. 11/18/21   Melene Plan, DO  ?potassium chloride SA (KLOR-CON) 20 MEQ tablet Take 1 tablet (20 mEq total) by mouth daily at 6 (six) AM. ?Patient not taking: Reported on 09/09/2021 09/05/21   Renne Crigler, PA-C  ?rivaroxaban (XARELTO) 20 MG TABS tablet Take 1 tablet by mouth daily with supper. ?Patient not taking: Reported on 09/26/2021 09/24/21   Melene Plan, DO  ?Potassium Chloride ER 20 MEQ  TBCR Take 20 mEq by mouth daily at 6 (six) AM. ?Patient not taking: No sig reported 08/07/21 09/05/21  Bobette Mo, MD  ?   ? ?Allergies    ?Bee venom, Lisinopril, and Sulfamethoxazole-trimethoprim   ? ?Review of Systems   ?Review of Systems ?Negative except as per HPI ?Physical Exam ?Updated Vital Signs ?BP 101/62   Pulse 94   Temp (!) 97.5 ?F (36.4 ?C) (Oral)   Resp 18   Ht 6\' 5"  (1.956 m)   Wt 107.5 kg   SpO2 99%   BMI 28.10 kg/m?  ?Physical Exam ?Vitals and nursing note reviewed.  ?Constitutional:   ?   General: He is not in acute distress. ?   Appearance: He is well-developed. He is not diaphoretic.  ?HENT:  ?   Head: Normocephalic and atraumatic.  ?   Mouth/Throat:  ?   Mouth: Mucous membranes are moist.  ?Eyes:  ?   Conjunctiva/sclera: Conjunctivae normal.  ?Cardiovascular:  ?   Rate and Rhythm: Normal rate and regular rhythm.  ?   Pulses: Normal pulses.  ?   Heart sounds: Normal heart sounds.  ?Pulmonary:  ?   Effort: Pulmonary effort is normal.  ?   Breath sounds: Normal breath sounds.  ?Abdominal:  ?   Palpations: Abdomen is soft.  ?   Tenderness: There is no abdominal tenderness.  ?Musculoskeletal:  ?   Cervical back: Neck supple.  ?   Right lower leg:  No edema.  ?   Left lower leg: No edema.  ?Skin: ?   General: Skin is warm and dry.  ?   Findings: No erythema or rash.  ?Neurological:  ?   Mental Status: He is alert and oriented to person, place, and time.  ?Psychiatric:     ?   Mood and Affect: Mood is elated.     ?   Thought Content: Thought content is paranoid. Thought content includes suicidal ideation. Thought content does not include homicidal ideation.  ? ? ?ED Results / Procedures / Treatments   ?Labs ?(all labs ordered are listed, but only abnormal results are displayed) ?Labs Reviewed  ?COMPREHENSIVE METABOLIC PANEL - Abnormal; Notable for the following components:  ?    Result Value  ? Potassium 3.2 (*)   ? Glucose, Bld 105 (*)   ? Calcium 8.5 (*)   ? All other components within  normal limits  ?ETHANOL - Abnormal; Notable for the following components:  ? Alcohol, Ethyl (B) 240 (*)   ? All other components within normal limits  ?CBC WITH DIFFERENTIAL/PLATELET - Abnormal; Notable for the following components:  ? RBC 4.05 (*)   ? HCT 38.1 (*)   ? All other components within normal limits  ?ACETAMINOPHEN LEVEL - Abnormal; Notable for the following components:  ? Acetaminophen (Tylenol), Serum <10 (*)   ? All other components within normal limits  ?SALICYLATE LEVEL - Abnormal; Notable for the following components:  ? Salicylate Lvl <7.0 (*)   ? All other components within normal limits  ?RAPID URINE DRUG SCREEN, HOSP PERFORMED  ? ? ?EKG ?EKG Interpretation ? ?Date/Time:  Sunday Feb 23 2022 [id number]:28:21 EDT ?Ventricular Rate:  91 ?PR Interval:  167 ?QRS Duration: 101 ?QT Interval:  366 ?QTC Calculation: 451 ?R Axis:   70 ?Text Interpretation: Sinus rhythm ST elev, probable normal early repol pattern When compared with ECG of 02/11/2022, No significant change was found Confirmed by Dione BoozeGlick, David ([id number]) on 02/23/2022 3:31:54 AM ? ?Radiology ?No results found. ? ?Procedures ?Procedures  ? ? ?Medications Ordered in ED ?Medications  ?potassium chloride SA (KLOR-CON M) CR tablet 40 mEq (has no administration in time range)  ?bictegravir-emtricitabine-tenofovir AF (BIKTARVY) 50-200-25 MG per tablet 1 tablet (has no administration in time range)  ?furosemide (LASIX) tablet 60 mg (has no administration in time range)  ?hydrOXYzine (ATARAX) tablet 50 mg (50 mg Oral Given 02/23/22 0320)  ? ? ?ED Course/ Medical Decision Making/ A&P ?  ?                        ?Medical Decision Making ?Amount and/or Complexity of Data Reviewed ?Labs: ordered. ? ?Risk ?Prescription drug management. ? ? ?This patient presents to the ED for concern of suicidal ideation, hallucinations, this involves an extensive number of treatment options, and is a complaint that carries with it a high risk of complications and morbidity.  The  differential diagnosis includes intoxication, substance abuse, psychosis ? ? ?Co morbidities that complicate the patient evaluation ? ?Heart failure, hypertension, HIV, polysubstance abuse, PE ? ? ?Additional history obtained: ? ?External records from outside source obtained and reviewed including admission record for [facility] April 15 to February 04, [facility] for periorbital cellulitis of the left eye. ? ?Records reviewed and unable to locate recent CD4, etc counts. ? ?Lab Tests: ? ?I Ordered, and personally interpreted labs.  The pertinent results include: Level elevated at 240.  CBC with normal white blood  cell count, CMP with mild hypokalemia with potassium of 3.2.  Tylenol and salicylate levels negative. ? ?Cardiac Monitoring: / EKG: ? ?The patient was maintained on a cardiac monitor.  I personally viewed and interpreted the cardiac monitored which showed an underlying rhythm of: Sinus rhythm, rate 91 ? ? ?Consultations Obtained: ? ?I requested consultation with the behavioral health team, consult pending ? ? ?Problem List / ED Course / Critical interventions / Medication management ? ?41 year old male brought in by police for suicidal ideation, in the ED paranoid that the demons are going to kill him.  He is intoxicated, UDS pending collection.  Patient is generally cooperative with staff other than pushing the code belt.  Patient was evaluated, medically cleared for behavioral health evaluation and disposition. ?I ordered medication including home medications, metoprolol held as current pressure is 101/62 and patient reports not taking.   ?Reevaluation of the patient after these medicines showed that the patient stayed the same ?I have reviewed the patients home medicines and have made adjustments as needed ? ? ?Social Determinants of Health: ? ?Suspect non compliance with ID follow up ? ? ?Test / Admission - Considered: ? ?Patient is medically cleared for behavioral health evaluation and  disposition. ? ? ? ? ? ? ? ? ?Final Clinical Impression(s) / ED Diagnoses ?Final diagnoses:  ?Alcohol abuse  ?Suicidal ideation  ?Psychosis, unspecified psychosis type (HCC)  ? ? ?Rx / DC Orders ?ED Discharge Orders   ? ? None  ?

## 2022-02-23 NOTE — ED Notes (Signed)
Pt changed into burgundy scrubs. Pt belongings placed in hospital bag, labeled and placed in nurse's station cabinet 9-12. Pt belongings include shoes, pants, shirt, socks, and a phone. ?

## 2022-02-23 NOTE — BH Assessment (Signed)
Clinician messaged Abigail J. Gerbich,  RN: "Hey. It's Trey with TTS. Is the pt able to engage in the assessment, if so the pt will need to be placed in a private room. Also is the pt under IVC?" ? ?Clinician awaiting response.  ? ?Jagger Demonte D Kristian Hazzard, MS, LCMHC, CRC ?Triage Specialist ?[phone] ? ?

## 2022-02-23 NOTE — ED Triage Notes (Signed)
Pt brought in by [employer] after calling 911 and reporting suicidal ideation. Pt states that he was rejected by a woman tonight and that's what made him suicidal. When asked if he has a plan, pt states "it doesn't matter because they're gonna kill me". Pt keeps continuing "they're gonna kill me". When asked who he is referring to, pt states "the demons... they're outside". Pt denies attempting suicide in the past. Pt admits to drinking a significant amount of beer and liquor tonight, but denies any other substance use tonight. Pt A&Ox4, VSS. ?

## 2022-02-24 ENCOUNTER — Emergency Department (HOSPITAL_COMMUNITY)
Admission: EM | Admit: 2022-02-24 | Discharge: 2022-02-25 | Disposition: A | Discharge status: Other Acute Inpatient Hospital | Payer: Medicaid Other | Source: Home / Self Care | Attending: Emergency Medicine | Admitting: Emergency Medicine

## 2022-02-24 ENCOUNTER — Encounter (HOSPITAL_COMMUNITY): Payer: Self-pay

## 2022-02-24 ENCOUNTER — Other Ambulatory Visit: Payer: Self-pay

## 2022-02-24 DIAGNOSIS — F1010 Alcohol abuse, uncomplicated: Secondary | ICD-10-CM

## 2022-02-24 DIAGNOSIS — Z79899 Other long term (current) drug therapy: Secondary | ICD-10-CM | POA: Insufficient documentation

## 2022-02-24 DIAGNOSIS — R45851 Suicidal ideations: Secondary | ICD-10-CM

## 2022-02-24 DIAGNOSIS — F332 Major depressive disorder, recurrent severe without psychotic features: Secondary | ICD-10-CM | POA: Insufficient documentation

## 2022-02-24 DIAGNOSIS — Z20822 Contact with and (suspected) exposure to covid-19: Secondary | ICD-10-CM | POA: Insufficient documentation

## 2022-02-24 DIAGNOSIS — Y908 Blood alcohol level of 240 mg/100 ml or more: Secondary | ICD-10-CM | POA: Insufficient documentation

## 2022-02-24 LAB — COMPREHENSIVE METABOLIC PANEL
ALT: 33 U/L (ref 0–44)
AST: 37 U/L (ref 15–41)
Albumin: 3.8 g/dL (ref 3.5–5.0)
Alkaline Phosphatase: 60 U/L (ref 38–126)
Anion gap: 10 (ref 5–15)
BUN: 13 mg/dL (ref 6–20)
CO2: 20 mmol/L — ABNORMAL LOW (ref 22–32)
Calcium: 8.8 mg/dL — ABNORMAL LOW (ref 8.9–10.3)
Chloride: 110 mmol/L (ref 98–111)
Creatinine, Ser: 1.3 mg/dL — ABNORMAL HIGH (ref 0.61–1.24)
GFR, Estimated: >60 mL/min (ref >60)
Glucose, Bld: 87 mg/dL (ref 70–99)
Potassium: 3 mmol/L — ABNORMAL LOW (ref 3.5–5.1)
Sodium: 140 mmol/L (ref 135–145)
Total Bilirubin: 0.7 mg/dL (ref 0.3–1.2)
Total Protein: 8.5 g/dL — ABNORMAL HIGH (ref 6.5–8.1)

## 2022-02-24 LAB — RAPID URINE DRUG SCREEN, HOSP PERFORMED
Amphetamines: NOT DETECTED
Barbiturates: NOT DETECTED
Benzodiazepines: NOT DETECTED
Cocaine: NOT DETECTED
Opiates: NOT DETECTED
Tetrahydrocannabinol: POSITIVE — AB

## 2022-02-24 LAB — CBC
HCT: 41.9 % (ref 39.0–52.0)
Hemoglobin: 14.3 g/dL (ref 13.0–17.0)
MCH: 32.2 pg (ref 26.0–34.0)
MCHC: 34.1 g/dL (ref 30.0–36.0)
MCV: 94.4 fL (ref 80.0–100.0)
Platelets: 286 10*3/uL (ref 150–400)
RBC: 4.44 MIL/uL (ref 4.22–5.81)
RDW: 15.7 % — ABNORMAL HIGH (ref 11.5–15.5)
WBC: 5.9 10*3/uL (ref 4.0–10.5)
nRBC: 0 % (ref 0.0–0.2)

## 2022-02-24 LAB — ETHANOL: Alcohol, Ethyl (B): 277 mg/dL — ABNORMAL HIGH (ref <10)

## 2022-02-24 LAB — RESP PANEL BY RT-PCR (FLU A&B, COVID) ARPGX2
Influenza A by PCR: NEGATIVE
Influenza B by PCR: NEGATIVE
SARS Coronavirus 2 by RT PCR: NEGATIVE

## 2022-02-24 LAB — ACETAMINOPHEN LEVEL: Acetaminophen (Tylenol), Serum: <10 ug/mL — ABNORMAL LOW (ref 10–30)

## 2022-02-24 LAB — SALICYLATE LEVEL: Salicylate Lvl: <7 mg/dL — ABNORMAL LOW (ref 7.0–30.0)

## 2022-02-24 MED ORDER — ONDANSETRON 4 MG PO TBDP: 4.0000 mg | ORAL_TABLET | Freq: Once | ORAL | Status: DC

## 2022-02-24 MED ORDER — LORAZEPAM 2 MG/ML IJ SOLN: 0.0000 mg | Freq: Four times a day (QID) | INTRAMUSCULAR | Status: DC

## 2022-02-24 MED ORDER — LORAZEPAM 1 MG PO TABS: 0.0000 mg | ORAL_TABLET | Freq: Four times a day (QID) | ORAL | Status: DC

## 2022-02-24 MED ORDER — LORAZEPAM 1 MG PO TABS: 0.0000 mg | ORAL_TABLET | Freq: Two times a day (BID) | ORAL | Status: DC

## 2022-02-24 MED ORDER — THIAMINE HCL 100 MG/ML IJ SOLN: 100.0000 mg | Freq: Every day | INTRAMUSCULAR | Status: DC

## 2022-02-24 MED ORDER — THIAMINE HCL 100 MG PO TABS: 100.0000 mg | ORAL_TABLET | Freq: Every day | ORAL | Status: DC

## 2022-02-24 MED ORDER — LORAZEPAM 2 MG/ML IJ SOLN: 0.0000 mg | Freq: Two times a day (BID) | INTRAMUSCULAR | Status: DC

## 2022-02-24 NOTE — ED Triage Notes (Addendum)
Patient brought in by EMS. Patient was found outside of a bar. Patient reports drinking 2 pints of liquor. Patient does not report any pain. Patient states he will swallow Clorox to kill himself. Patient states he has been thinking about it for 3 days and can do it at any time. Patient was discharged today for SI.  ?

## 2022-02-24 NOTE — ED Notes (Signed)
ED RN called into room by pt and sitter.  Pt states he has a cardiology appointment at 9:45am today.  MD Dixon notified.  Awaiting MD decision. ?

## 2022-02-24 NOTE — ED Notes (Signed)
Patient is asleep.  

## 2022-02-24 NOTE — ED Notes (Signed)
ED RN asked pt to move to hallway bed.  Pt states he wants to leave AMA.  MD Durwin Nora notified and aware. MD ok w/ pt leaving AMA.  ED RN explained risks of leaving AMA to pt.  Pt states understanding and acceptance.  Pt given belongings.  Pt to restroom to changed into personal clothing. ?

## 2022-02-24 NOTE — ED Notes (Signed)
Pt. In burgundy scrubs and wanded by security. Pt. Has 2 belongings bags locked up at the nurses station in triage. Pt. Has 1 pr. Red/black shoes, 1 cell phone( with ear buds), 1 ID card, 1 yellow necklace, 1 black sweat pant, 1 white t-shirt and 1 black hat. ?

## 2022-02-24 NOTE — ED Notes (Signed)
Pt leaving ED AMA.  Pt denies questions or concerns upon dc. Pt declined wheelchair assistance upon dc. Pt ambulated out of ed w/ steady gait. No belongings left in room upon dc. ? ?

## 2022-02-24 NOTE — ED Provider Notes (Signed)
Emergency Medicine Observation Re-evaluation Note ? ?William Powers is a 41 y.o. male, seen on rounds today.  Pt initially presented to the ED for complaints of Suicidal ?Currently, the patient is sitting in bed. ? ?Physical Exam  ?BP (!) 158/104   Pulse 83   Temp (!) 97.5 ?F (36.4 ?C) (Oral)   Resp 12   Ht 6\' 5"  (1.956 m)   Wt 107.5 kg   SpO2 99%   BMI 28.10 kg/m?  ?Physical Exam ?General: Awake, alert, calm ?Cardiac: Regular rate and rhythm ?Lungs: Breathing is unlabored ?Psych: No agitation, recanting any thoughts of SI ? ?ED Course / MDM  ?EKG:EKG Interpretation ? ?Date/Time:  Monday Feb 24 2022 03:27:31 EDT ?Ventricular Rate:  80 ?PR Interval:  172 ?QRS Duration: 97 ?QT Interval:  374 ?QTC Calculation: 432 ?R Axis:   55 ?Text Interpretation: Sinus rhythm RSR' in V1 or V2, probably normal variant ST elev, probable normal early repol pattern When compared with ECG of 02/23/2022, No significant change was found Confirmed by 04/25/2022 (Dione Booze) on 02/24/2022 3:41:31 AM ? ?I have reviewed the labs performed to date as well as medications administered while in observation.  Recent changes in the last 24 hours include patient presented to the ED 2 days ago for SI.  Initial alcohol level was 240.  He was seen by psychiatry yesterday.  Plan is to initiate medications while seeking placement. ? ?Patient stating that he has to leave in order to attend a cardiology appointment at 9:30 AM.  He recants any thoughts of SI.  He states that he was simply drunk and talking crazy.  He states that he very much wants to live.  He currently is considering not drinking but not ready to make that change.  He does not have interest in continuing on psychiatric medications.  He does not have interest in outpatient therapy. ? ?Given the patient's current normal mental state and denial of SI, I do not have any indication to IVC.  Psychiatry team was notified that patient is requesting to leave.  Patient was encouraged to stay for  psychiatric reassessment, but at this time is requesting to leave AMA. ? ?Plan  ?Current plan is for trial of meds and placement. ? Yoltzin Barg is not under involuntary commitment. ? ? ?  ?[name], MD ?02/24/22 [phone] ? ?

## 2022-02-24 NOTE — ED Provider Notes (Signed)
?[facility] DEPT ?Provider Note ? ? ?CSN: [id number] ?Arrival date & time: 02/24/22  2105 ? ?  ? ?History ? ?Chief Complaint  ?Patient presents with  ? Alcohol Intoxication  ? Suicidal  ? ? ?William Powers is a 41 y.o. male. ? ?41 yo M with a chief complaint of suicidal ideation.  The patient actually was seen yesterday and was deemed to meet inpatient criteria and then he left AGAINST MEDICAL ADVICE.  This occurred early this morning.  Patient had presumably gone to the bar after the and was found outside the bar heavily intoxicated.  Was brought here with EMS after he mentioned that he was planning on hurting himself.  Tells me that he plans on drinking bleach.'s been thinking about this for a few days.  He tells me he got a little bit anxious and that is why he had left earlier today. ? ? ?Alcohol Intoxication ? ? ?  ? ?Home Medications ?Prior to Admission medications   ?Medication Sig Start Date End Date Taking? Authorizing Provider  ?BIKTARVY 50-200-25 MG TABS tablet Take 1 tablet by mouth daily. ?Patient not taking: Reported on 02/23/2022 11/18/21   Deno Etienne, DO  ?docusate sodium (COLACE) 100 MG capsule Take 1 capsule (100 mg total) by mouth every 12 (twelve) hours. ?Patient not taking: Reported on 09/26/2021 08/31/21   Blanchie Dessert, MD  ?eplerenone (INSPRA) 25 MG tablet Take 1 tablet (25 mg total) by mouth daily. ?Patient not taking: Reported on 02/23/2022 09/24/21   Deno Etienne, DO  ?furosemide (LASIX) 40 MG tablet Take 1 and 1/2 tablets by mouth daily. ?Patient taking differently: Take 60 mg by mouth daily as needed for fluid or edema. 09/24/21 10/24/21  Deno Etienne, DO  ?hydrocortisone-pramoxine (PROCTOFOAM HC) rectal foam Place 1 applicatorful rectally 2 times daily. ?Patient not taking: Reported on 09/26/2021 09/24/21   Deno Etienne, DO  ?metoprolol succinate (TOPROL-XL) 50 MG 24 hr tablet Take 1 tablet by mouth 2 times daily. ?Patient not taking: Reported on 02/23/2022 11/18/21    Deno Etienne, DO  ?potassium chloride SA (KLOR-CON) 20 MEQ tablet Take 1 tablet (20 mEq total) by mouth daily at 6 (six) AM. ?Patient not taking: Reported on 09/09/2021 09/05/21   Carlisle Cater, PA-C  ?rivaroxaban (XARELTO) 20 MG TABS tablet Take 1 tablet by mouth daily with supper. ?Patient not taking: Reported on 09/26/2021 09/24/21   Deno Etienne, DO  ?Potassium Chloride ER 20 MEQ TBCR Take 20 mEq by mouth daily at 6 (six) AM. ?Patient not taking: No sig reported 08/07/21 09/05/21  Reubin Milan, MD  ?   ? ?Allergies    ?Bee venom, Lisinopril, and Sulfamethoxazole-trimethoprim   ? ?Review of Systems   ?Review of Systems ? ?Physical Exam ?Updated Vital Signs ?BP 118/78 (BP Location: Left Arm)   Pulse (!) 105   Temp 98.6 ?F (37 ?C) (Oral)   Resp 18   SpO2 98%  ?Physical Exam ?Vitals and nursing note reviewed.  ?Constitutional:   ?   Appearance: He is well-developed.  ?HENT:  ?   Head: Normocephalic and atraumatic.  ?Eyes:  ?   Pupils: Pupils are equal, round, and reactive to light.  ?Neck:  ?   Vascular: No JVD.  ?Cardiovascular:  ?   Rate and Rhythm: Normal rate and regular rhythm.  ?   Heart sounds: No murmur heard. ?  No friction rub. No gallop.  ?Pulmonary:  ?   Effort: No respiratory distress.  ?   Breath  sounds: No wheezing.  ?Abdominal:  ?   General: There is no distension.  ?   Tenderness: There is no abdominal tenderness. There is no guarding or rebound.  ?Musculoskeletal:     ?   General: Normal range of motion.  ?   Cervical back: Normal range of motion and neck supple.  ?Skin: ?   Coloration: Skin is not pale.  ?   Findings: No rash.  ?Neurological:  ?   Mental Status: He is alert and oriented to person, place, and time.  ?Psychiatric:     ?   Behavior: Behavior normal.  ? ? ?ED Results / Procedures / Treatments   ?Labs ?(all labs ordered are listed, but only abnormal results are displayed) ?Labs Reviewed  ?COMPREHENSIVE METABOLIC PANEL - Abnormal; Notable for the following components:  ?    Result  Value  ? Potassium 3.0 (*)   ? CO2 20 (*)   ? Creatinine, Ser 1.30 (*)   ? Calcium 8.8 (*)   ? Total Protein 8.5 (*)   ? All other components within normal limits  ?ETHANOL - Abnormal; Notable for the following components:  ? Alcohol, Ethyl (B) 277 (*)   ? All other components within normal limits  ?CBC - Abnormal; Notable for the following components:  ? RDW 15.7 (*)   ? All other components within normal limits  ?RAPID URINE DRUG SCREEN, HOSP PERFORMED - Abnormal; Notable for the following components:  ? Tetrahydrocannabinol POSITIVE (*)   ? All other components within normal limits  ?SALICYLATE LEVEL - Abnormal; Notable for the following components:  ? Salicylate Lvl <8.4 (*)   ? All other components within normal limits  ?ACETAMINOPHEN LEVEL - Abnormal; Notable for the following components:  ? Acetaminophen (Tylenol), Serum <10 (*)   ? All other components within normal limits  ?RESP PANEL BY RT-PCR (FLU A&B, COVID) ARPGX2  ? ? ?EKG ?None ? ?Radiology ?No results found. ? ?Procedures ?Procedures  ? ? ?Medications Ordered in ED ?Medications  ?ondansetron (ZOFRAN-ODT) disintegrating tablet 4 mg (has no administration in time range)  ?LORazepam (ATIVAN) injection 0-4 mg (has no administration in time range)  ?  Or  ?LORazepam (ATIVAN) tablet 0-4 mg (has no administration in time range)  ?LORazepam (ATIVAN) injection 0-4 mg (has no administration in time range)  ?  Or  ?LORazepam (ATIVAN) tablet 0-4 mg (has no administration in time range)  ?thiamine tablet 100 mg (has no administration in time range)  ?  Or  ?thiamine (B-1) injection 100 mg (has no administration in time range)  ? ? ?ED Course/ Medical Decision Making/ A&P ?  ?                        ?Medical Decision Making ?Amount and/or Complexity of Data Reviewed ?Labs: ordered. ? ?Risk ?OTC drugs. ?Prescription drug management. ? ? ?41 yo M with a chief complaint of suicidal ideation.  The patient had already been seen for this yesterday and was assessed and  decided that he met inpatient criteria.  He was never IVC'ed and he left AGAINST MEDICAL ADVICE.  He is now back after drinking heavily and tells me that he plans to stay and that he is again suicidal.  Has a plan to drink bleach. ? ?Patient's labs have resulted without significant change from yesterday.  His alcohol level is similar he is positive for marijuana.  I feel he is medically clear for TTS evaluation. ? ?The  patients results and plan were reviewed and discussed.   ?Any x-rays performed were independently reviewed by myself.  ? ?Differential diagnosis were considered with the presenting HPI. ? ?Medications  ?ondansetron (ZOFRAN-ODT) disintegrating tablet 4 mg (has no administration in time range)  ?LORazepam (ATIVAN) injection 0-4 mg (has no administration in time range)  ?  Or  ?LORazepam (ATIVAN) tablet 0-4 mg (has no administration in time range)  ?LORazepam (ATIVAN) injection 0-4 mg (has no administration in time range)  ?  Or  ?LORazepam (ATIVAN) tablet 0-4 mg (has no administration in time range)  ?thiamine tablet 100 mg (has no administration in time range)  ?  Or  ?thiamine (B-1) injection 100 mg (has no administration in time range)  ? ? ?Vitals:  ? 02/24/22 2113 02/24/22 2114  ?BP: 118/78   ?Pulse: (!) 105   ?Resp: 18   ?Temp: 98.6 ?F (37 ?C)   ?TempSrc: Oral   ?SpO2: 95% 98%  ? ? ?Final diagnoses:  ?Suicidal ideations  ?ETOH abuse  ? ? ? ? ? ? ? ? ?Final Clinical Impression(s) / ED Diagnoses ?Final diagnoses:  ?Suicidal ideations  ?ETOH abuse  ? ? ?Rx / DC Orders ?ED Discharge Orders   ? ? None  ? ?  ? ? ?  ?Deno Etienne, DO ?02/24/22 2243 ? ?

## 2022-02-24 NOTE — ED Notes (Signed)
Patient is laying on the floor. Patient did not fall.  ?

## 2022-02-25 ENCOUNTER — Other Ambulatory Visit (HOSPITAL_COMMUNITY)
Admission: EM | Admit: 2022-02-25 | Discharge: 2022-02-26 | Disposition: A | Discharge status: Home / Self Care | Payer: Medicaid Other | Attending: Psychiatry | Admitting: Psychiatry

## 2022-02-25 DIAGNOSIS — F1910 Other psychoactive substance abuse, uncomplicated: Secondary | ICD-10-CM | POA: Insufficient documentation

## 2022-02-25 DIAGNOSIS — Z5900 Homelessness unspecified: Secondary | ICD-10-CM | POA: Insufficient documentation

## 2022-02-25 DIAGNOSIS — R45851 Suicidal ideations: Secondary | ICD-10-CM | POA: Diagnosis not present

## 2022-02-25 DIAGNOSIS — Y908 Blood alcohol level of 240 mg/100 ml or more: Secondary | ICD-10-CM | POA: Insufficient documentation

## 2022-02-25 DIAGNOSIS — I5022 Chronic systolic (congestive) heart failure: Secondary | ICD-10-CM | POA: Insufficient documentation

## 2022-02-25 DIAGNOSIS — F1010 Alcohol abuse, uncomplicated: Secondary | ICD-10-CM | POA: Insufficient documentation

## 2022-02-25 DIAGNOSIS — I110 Hypertensive heart disease with heart failure: Secondary | ICD-10-CM | POA: Insufficient documentation

## 2022-02-25 MED ORDER — POTASSIUM CHLORIDE CRYS ER 20 MEQ PO TBCR
20.0000 meq | EXTENDED_RELEASE_TABLET | Freq: Two times a day (BID) | ORAL | Status: DC
Start: 2022-02-25 — End: 2022-02-26
  Administered 2022-02-25 – 2022-02-26 (×3): 20 meq via ORAL
  Filled 2022-02-25 (×3): qty 1

## 2022-02-25 MED ORDER — THIAMINE HCL 100 MG/ML IJ SOLN
100.0000 mg | Freq: Once | INTRAMUSCULAR | Status: DC
  Filled 2022-02-25: qty 2

## 2022-02-25 MED ORDER — ADULT MULTIVITAMIN W/MINERALS CH
1.0000 | ORAL_TABLET | Freq: Every day | ORAL | Status: DC
  Administered 2022-02-25 – 2022-02-26 (×2): 1 via ORAL
  Filled 2022-02-25 (×2): qty 1

## 2022-02-25 MED ORDER — LOPERAMIDE HCL 2 MG PO CAPS: 2.0000 mg | ORAL_CAPSULE | ORAL | Status: DC | PRN

## 2022-02-25 MED ORDER — THIAMINE HCL 100 MG PO TABS
100.0000 mg | ORAL_TABLET | Freq: Every day | ORAL | Status: DC
  Administered 2022-02-26: 100 mg via ORAL
  Filled 2022-02-25: qty 1

## 2022-02-25 MED ORDER — HYDROXYZINE HCL 25 MG PO TABS: 25.0000 mg | ORAL_TABLET | Freq: Four times a day (QID) | ORAL | Status: DC | PRN

## 2022-02-25 MED ORDER — MAGNESIUM HYDROXIDE 400 MG/5ML PO SUSP: 30.0000 mL | Freq: Every day | ORAL | Status: DC | PRN

## 2022-02-25 MED ORDER — ONDANSETRON 4 MG PO TBDP: 4.0000 mg | ORAL_TABLET | Freq: Four times a day (QID) | ORAL | Status: DC | PRN

## 2022-02-25 MED ORDER — ACETAMINOPHEN 325 MG PO TABS: 650.0000 mg | ORAL_TABLET | Freq: Four times a day (QID) | ORAL | Status: DC | PRN

## 2022-02-25 MED ORDER — ALUM & MAG HYDROXIDE-SIMETH 200-200-20 MG/5ML PO SUSP: 30.0000 mL | ORAL | Status: DC | PRN

## 2022-02-25 MED ORDER — LORAZEPAM 1 MG PO TABS: 1.0000 mg | ORAL_TABLET | Freq: Four times a day (QID) | ORAL | Status: DC | PRN

## 2022-02-25 NOTE — ED Notes (Signed)
Pt sleeping@this time. Breathing even and unlabored. Will continue to monitor for safety 

## 2022-02-25 NOTE — Progress Notes (Signed)
Patient resting with even and unlabored respirations. No objective signs of discomfort. ?

## 2022-02-25 NOTE — Discharge Instructions (Addendum)

## 2022-02-25 NOTE — ED Notes (Signed)
PT sitting in dayroom watching television and having a snack. Pt calm and cooperative. No c/o pain or distress. Will continue to monitor for safety ?

## 2022-02-25 NOTE — Progress Notes (Addendum)
William Powers took a nap, woke up for dinner and  remained in the milieu with his peers watching TV. His mood is calm and denied any withdrawal symptoms. ?

## 2022-02-25 NOTE — ED Notes (Signed)
Report given to Bobby RN

## 2022-02-25 NOTE — ED Notes (Signed)
[facility] is here.  ?

## 2022-02-25 NOTE — Progress Notes (Signed)
William Powers was escorted to [facility] from [facility], the necessary paperwork was completed and he was oriented to his new environment.He denied all of the psychiatric symptoms including feeling suicidal. ?

## 2022-02-25 NOTE — ED Provider Notes (Signed)
[facility] Continuous Assessment Admission H&P ? ?Date: 02/25/22 ?Patient Name: William MurphyWayne Tyrone Powers ?MRN: [id number] ?Chief Complaint: No chief complaint on file. ?   ? ?Diagnoses:  ?Final diagnoses:  ?Suicidal ideation  ?Alcohol abuse  ?Substance abuse (HCC)  ?Homelessness  ? ? ?HPI: William Powers,  41 y.o [facility]   ?[facility] noted;  41 yo M with a chief complaint of suicidal ideation.  The patient actually was seen yesterday and was deemed to meet inpatient criteria and then he left AGAINST MEDICAL ADVICE.  This occurred early this morning.  Patient had presumably gone to the bar after the and was found outside the bar heavily intoxicated.  Was brought here with EMS after he mentioned that he was planning on hurting himself.  Tells me that he plans on drinking bleach.'s been thinking about this for a few days.  He tells me he got a little bit anxious and that is why he had left earlier today. ? ?Observation of patient, he is alert and oriented,  speech clear,  mood blunt affect congruent with mood.  Pt say he is SI with plan to drink household beach.  Pt was not interested in having a conversation,  he kept saying they told me you have a bed for me.  Pt admit that he is homeless and need somewhere to stay.  He denies AVH,  HI.  Pt stated Ijust want to go sleep that all. ? ?Inpatient observation  ? ?PHQ 2-9:   ?Flowsheet Row [facility]  ?C-SSRS RISK CATEGORY High Risk Low Risk No Risk  ? ?  ?  ? ?Total Time spent with patient: 20 minutes ? ?Musculoskeletal  ?Strength & Muscle Tone: within normal limits ?Gait & Station: normal ?Patient leans: N/A ? ?Psychiatric Specialty Exam  ?Presentation ?General Appearance: Disheveled ? ?Eye  Contact:Fair ? ?Speech:Slow ? ?Speech Volume:Decreased ? ?Handedness:Ambidextrous ? ? ?Mood and Affect  ?Mood:Anxious ? ?Affect:Blunt ? ? ?Thought Process  ?Thought Processes:Linear ? ?Descriptions of Associations:Circumstantial ? ?Orientation:Full (Time, Place and Person) ? ?Thought Content:Illogical ? Diagnosis of Schizophrenia or Schizoaffective disorder in past: No ? Duration of Psychotic Symptoms: Less than six months ? ?Hallucinations:Hallucinations: None ? ?Ideas of Reference:None ? ?Suicidal Thoughts:Suicidal Thoughts: Yes, Passive ?SI Passive Intent and/or Plan: Without Plan ? ?Homicidal Thoughts:Homicidal Thoughts: No ? ? ?Sensorium  ?Memory:Immediate Fair ? ?Judgment:Poor ? ?Insight:Fair ? ? ?Executive Functions  ?Concentration:Poor ? ?Attention Span:Fair ? ?Recall:Fair ? ?Fund of Knowledge:Fair ? ?Language:Fair ? ? ?Psychomotor Activity  ?Psychomotor Activity:Psychomotor Activity: Normal ? ? ?Assets  ?Assets:Communication Skills; Desire for Improvement ? ? ?Sleep  ?Sleep:Sleep: Fair ? ? ?Nutritional Assessment (For OBS and [facility] admissions only) ?Has the patient had a weight loss or gain of 10 pounds or more in the last 3 months?: No ?Has the patient had a decrease in food intake/or appetite?: No ?Does the patient have dental problems?: No ?Does the patient have eating habits or behaviors that may be indicators of an eating disorder including binging or inducing vomiting?: No ?Has the patient recently lost weight without trying?: 0 ?Has the patient been eating poorly because of a decreased appetite?: 0 ?Malnutrition Screening Tool Score: 0 ? ? ? ?Physical Exam ?HENT:  ?   Head: Normocephalic.  ?   Nose: Nose normal.  ?  Cardiovascular:  ?   Rate and Rhythm: Normal rate.  ?Pulmonary:  ?   Effort: Pulmonary effort is normal.  ?Musculoskeletal:     ?   General: Normal range of motion.  ?   Cervical back: Normal range of motion.  ?Skin: ?   General: Skin is warm.  ?Neurological:  ?   General: No focal deficit  present.  ?   Mental Status: He is alert.  ?Psychiatric:     ?   Mood and Affect: Mood normal.     ?   Behavior: Behavior normal.     ?   Thought Content: Thought content normal.     ?   Judgment: Judgment normal.  ? ?Review of Systems  ?Constitutional: Negative.   ?HENT: Negative.    ?Eyes: Negative.   ?Respiratory: Negative.    ?Cardiovascular: Negative.   ?Gastrointestinal: Negative.   ?Genitourinary: Negative.   ?Musculoskeletal: Negative.   ?Skin: Negative.   ?Neurological: Negative.   ?Endo/Heme/Allergies: Negative.   ?Psychiatric/Behavioral:  Positive for substance abuse and suicidal ideas. The patient is nervous/anxious.   ? ?Blood pressure 104/76, pulse 89, temperature 98.7 ?F (37.1 ?C), temperature source Tympanic, resp. rate 18, SpO2 100 %. There is no height or weight on file to calculate BMI. ? ?Past Psychiatric History: substance abuse,  Depression,  alcohol abuse ? ?Is the patient at risk to self? Yes  ?Has the patient been a risk to self in the past 6 months? Yes .    ?Has the patient been a risk to self within the distant past? Yes   ?Is the patient a risk to others? No   ?Has the patient been a risk to others in the past 6 months? No   ?Has the patient been a risk to others within the distant past? No  ? ?Past Medical History:  ?Past Medical History:  ?Diagnosis Date  ? Heart failure (HCC)   ? HFrEF (heart failure with reduced ejection fraction) (HCC)   ? 15% EF as of 2019  ? HIV (human immunodeficiency virus infection) (HCC)   ? Hypertension   ? Polysubstance abuse (HCC)   ? Pulmonary embolism (HCC)   ?  ?Past Surgical History:  ?Procedure Laterality Date  ? HERNIA REPAIR    ? ? ?Family History:  ?Family History  ?Problem Relation Age of Onset  ? Lupus Mother   ? HIV Mother   ? HIV Father   ? ? ?Social History:  ?Social History  ? ?Socioeconomic History  ? Marital status: Divorced  ?  Spouse name: Not on file  ? Number of children: Not on file  ? Years of education: Not on file  ? Highest  education level: Not on file  ?Occupational History  ? Not on file  ?Tobacco Use  ? Smoking status: Some Days  ?  Types: Cigarettes  ? Smokeless tobacco: Never  ?Vaping Use  ? Vaping Use: Never used  ?Substance and Sexual Activity  ? Alcohol use: Yes  ? Drug use: Yes  ?  Types: Marijuana  ? Sexual activity: Not on file  ?Other Topics Concern  ? Not on file  ?Social History Narrative  ? Not on file  ? ?Social Determinants of Health  ? ?Financial Resource Strain: Not on file  ?Food Insecurity: Not on file  ?Transportation Needs: Not on file  ?Physical Activity: Not on file  ?Stress: Not on file  ?Social Connections: Not on file  ?Intimate Partner Violence: Not on file  ? ? ?  SDOH:  ?SDOH Screenings  ? ?Alcohol Screen: Not on file  ?Depression (PHQ2-9): Low Risk   ? PHQ-2 Score: 0  ?Financial Resource Strain: Not on file  ?Food Insecurity: Not on file  ?Housing: Not on file  ?Physical Activity: Not on file  ?Social Connections: Not on file  ?Stress: Not on file  ?Tobacco Use: High Risk  ? Smoking Tobacco Use: Some Days  ? Smokeless Tobacco Use: Never  ? Passive Exposure: Not on file  ?Transportation Needs: Not on file  ? ? ?Last Labs:  ?Admission on 02/24/2022, Discharged on 02/25/2022  ?Component Date Value Ref Range Status  ? Sodium 02/24/2022 140  135 - 145 mmol/L Final  ? Potassium 02/24/2022 3.0 (L)  3.5 - 5.1 mmol/L Final  ? Chloride 02/24/2022 110  98 - 111 mmol/L Final  ? CO2 02/24/2022 20 (L)  22 - 32 mmol/L Final  ? Glucose, Bld 02/24/2022 87  70 - 99 mg/dL Final  ? Glucose reference range applies only to samples taken after fasting for at least 8 hours.  ? BUN 02/24/2022 13  6 - 20 mg/dL Final  ? Creatinine, Ser 02/24/2022 1.30 (H)  0.61 - 1.24 mg/dL Final  ? Calcium 45/85/9292 8.8 (L)  8.9 - 10.3 mg/dL Final  ? Total Protein 02/24/2022 8.5 (H)  6.5 - 8.1 g/dL Final  ? Albumin 44/62/8638 3.8  3.5 - 5.0 g/dL Final  ? AST 17/71/1657 37  15 - 41 U/L Final  ? ALT 02/24/2022 33  0 - 44 U/L Final  ? Alkaline  Phosphatase 02/24/2022 60  38 - 126 U/L Final  ? Total Bilirubin 02/24/2022 0.7  0.3 - 1.2 mg/dL Final  ? GFR, Estimated 02/24/2022 >60  >60 mL/min Final  ? Comment: (NOTE) ?Calculated using the CKD-EPI Creatinine Equation (2021) ?  ? Anion gap 05

## 2022-02-25 NOTE — ED Notes (Signed)
PT has taken his night time medications and is now in the room lying quietly in bed. ?

## 2022-02-25 NOTE — ED Notes (Signed)
TTS in progress 

## 2022-02-25 NOTE — ED Notes (Signed)
Pt is currently sitting in the dining room attending AA. ?

## 2022-02-25 NOTE — ED Provider Notes (Signed)
TTS consultation is appreciated.  Patient will be transferred to [facility] behavioral health urgent care for further evaluation, Arvil Persons NP excepting. ?  ?Dione Booze, MD ?02/25/22 [phone] ? ?

## 2022-02-25 NOTE — BH Assessment (Signed)
Comprehensive Clinical Assessment (CCA) Note ? ?02/25/2022 ?William Powers ?[id number] ? ?DISPOSITION: Gave clinical report to Lindon Romp, FNP who recommended Pt be transferred to [facility] for continuous assessment. Cornelia Copa, [facility] at [facility], approves transfer. Notified Dr Delora Fuel and Andre Lefort, RN of recommendation via secure message. ? ?The patient demonstrates the following risk factors for suicide: Chronic risk factors for suicide include: psychiatric disorder of major depressive disorder, substance use disorder, and medical illness multiple medical problems . Acute risk factors for suicide include: unemployment, social withdrawal/isolation, and loss (financial, interpersonal, professional). Protective factors for this patient include: responsibility to others (children, family). Considering these factors, the overall suicide risk at this point appears to be high. Patient is not appropriate for outpatient follow up. ? ?[place] ED from 02/24/2022 in [place] DEPT ED from 02/23/2022 in [place] DEPT ED from 02/10/2022 in [place]  ?C-SSRS RISK CATEGORY High Risk Low Risk No Risk  ? ?  ? ?Pt is a 41 year old divorced male who presents unaccompanied to [place] ED via EMS reporting depressive symptoms, anxiety, suicidal ideation, alcohol intoxication, and visual hallucinations. Pt was at [facility] ED on 02/24/2022 for depressive symptoms, suicidal ideation, and alcohol use. Pt left AMA in the morning and was found in the evening intoxicated outside of a bar. He reported suicidal ideation with plan to drink bleach, adding that he has been thinking about it for 3 days and can do it any time.  ? ?During assessment, Pt appears intoxicated and is a poor historian. He says he is not certain how much he drank but told triage RN he drank 2 pints of liquor. Pt's blood alcohol level is 277. He denies other  substance use or taking any medications today. Urine drug screen is positive for cannabis. His medical record indicates a history of using marijuana and cocaine. He repeatedly says he was trying to kill himself. He confirms he has considered ingesting bleach in a suicide attempt. He acknowledges thoughts of harming others, stating he wants to hurt "a couple of people I know" with no clear plan or intent. He repeatedly says he has experienced visual hallucinations of demons, devils, and "carrying angels that take you to heaven." He denies auditory hallucinations.  ? ?Pt is currently homeless. He is on disability and receives SSI. Medical record indicates a history of chronic medical conditions, which include CHF, HIV, anemia. He denies legal problems. He denies access to firearms. Pt says he has no outpatient mental health providers. He says he was recently psychiatrically hospitalized in [place]. ? ?Pt is covered by a blanket, drowsy, and is oriented to person, place and situation. He is obviously intoxicated. He speaks in a mumbled, slurred tone, at low volume and normal pace. Motor behavior appears normal. Eye contact is minimal. Pt's mood is depressed and anxious, affect is congruent with mood. Thought process is coherent with delusional thought content. He is minimally cooperative. ? ?Chief Complaint:  ?Chief Complaint  ?Patient presents with  ? Alcohol Intoxication  ? Suicidal  ? ?Visit Diagnosis:  ?F33.2 Major depressive disorder, Recurrent episode, Severe ?F10.20 Alcohol use disorder, Severe ? ? ?CCA Screening, Triage and Referral (STR) ? ?Patient Reported Information ?How did you hear about [place]? Self ? ?What Is the Reason for Your Visit/Call Today? Pt left ED AMA earlier today and drank a large quantity of alcohol. He reports current suicidal ideation with plan to drink bleach. ? ?How Long Has  This Been Causing You Problems? 1-6 months ? ?What Do You Feel Would Help You the Most Today? Alcohol or Drug Use  Treatment; Treatment for Depression or other mood problem; Medication(s) ? ? ?Have You Recently Had Any Thoughts About Hurting Yourself? Yes ? ?Are You Planning to Commit Suicide/Harm Yourself At This time? Yes ? ? ?Have you Recently Had Thoughts About [place]? Yes ? ?Are You Planning to Harm Someone at This Time? No ? ?Explanation: No data recorded ? ?Have You Used Any Alcohol or Drugs in the Past 24 Hours? Yes ? ?How Long Ago Did You Use Drugs or Alcohol? No data recorded ?What Did You Use and How Much? Unknown quantity of alcohol, BAL=277 ? ? ?Do You Currently Have a Therapist/Psychiatrist? No ? ?Name of Therapist/Psychiatrist: No data recorded ? ?Have You Been Recently Discharged From Any Office Practice or Programs? Yes ? ?Explanation of Discharge From Practice/Program: PT was discharged from the ED 02/24/2022 ? ? ?  ?CCA Screening Triage Referral Assessment ?Type of Contact: Tele-Assessment ? ?Telemedicine Service Delivery: Telemedicine service delivery: This service was provided via telemedicine using a 2-way, interactive audio and video technology ? ?Is this Initial or Reassessment? Initial Assessment ? ?Date Telepsych consult ordered in [facility]:  02/24/22 ? ?Time Telepsych consult ordered in [facility]:  2220 ? ?Location of Assessment: [facility] ED ? ?Provider Location: [facility] Assessment Services ? ? ?Collateral Involvement: Pt denies having supports. Pt declined from clinician to call anyone to obtain additional information. ? ? ?Does Patient Have a Stage manager Guardian? No data recorded ?Name and Contact of Legal Guardian: No data recorded ?If Minor and Not Living with Parent(s), Who has Custody? NA ? ?Is CPS involved or ever been involved? Never ? ?Is APS involved or ever been involved? Never ? ? ?Patient Determined To Be At Risk for Harm To Self or Others Based on Review of Patient Reported Information or Presenting Complaint? Yes, for Self-Harm ? ?Method: No data recorded ?Availability of Means:  No data recorded ?Intent: No data recorded ?Notification Required: No data recorded ?Additional Information for Danger to Others Potential: No data recorded ?Additional Comments for Danger to Others Potential: No data recorded ?Are There Guns or Other Weapons in [place]? No data recorded ?Types of Guns/Weapons: No data recorded ?Are These Weapons Safely Secured?                            No data recorded ?Who Could Verify You Are Able To Have These Secured: No data recorded ?Do You Have any Outstanding Charges, Pending Court Dates, Parole/Probation? No data recorded ?Contacted To Inform of Risk of Harm To Self or Others: Unable to Contact: ? ? ? ?Does Patient Present under Involuntary Commitment? No ? ?IVC Papers Initial File Date: No data recorded ? ?[place] of Residence: Kathleen Argue (Homeless in [place]) ? ? ?Patient Currently Receiving the Following Services: Not Receiving Services ? ? ?Determination of Need: Emergent (2 hours) ? ? ?Options For Referral: [facility]; Inpatient Hospitalization ? ? ? ? ?CCA Biopsychosocial ?Patient Reported Schizophrenia/Schizoaffective Diagnosis in Past: No ? ? ?Strengths: Pt is seeking treatment ? ? ?Mental Health Symptoms ?Depression:   ?Change in energy/activity; Difficulty Concentrating; Fatigue; Hopelessness; Sleep (too much or little); Irritability ?  ?Duration of Depressive symptoms:  ?Duration of Depressive Symptoms: Greater than two weeks ?  ?Mania:   ?None ?  ?Anxiety:    ?Worrying; Tension; Restlessness ?  ?Psychosis:   ?  Hallucinations ?  ?Duration of Psychotic symptoms:  ?Duration of Psychotic Symptoms: Less than six months ?  ?Trauma:   ?None ?  ?Obsessions:   ?None ?  ?Compulsions:   ?None ?  ?Inattention:   ?N/A ?  ?Hyperactivity/Impulsivity:   ?N/A ?  ?Oppositional/Defiant Behaviors:   ?N/A ?  ?Emotional Irregularity:   ?Recurrent suicidal behaviors/gestures/threats ?  ?Other Mood/Personality Symptoms:   ?None ?  ? ?Mental Status Exam ?Appearance and  self-care  ?Stature:   ?Average ?  ?Weight:   ?Average weight ?  ?Clothing:   ?Casual ?  ?Grooming:   ?Normal ?  ?Cosmetic use:   ?None ?  ?Posture/gait:   ?Normal ?  ?Motor activity:   ?Not Remarkable ?  ?Sens

## 2022-02-25 NOTE — ED Provider Notes (Addendum)
Facility Based Crisis Admission H&P ? ?Date: 02/25/22 ?Patient Name: William MurphyWayne Tyrone Passage ?MRN: [id number] ?Chief Complaint: No chief complaint on file. ?   ? ?Diagnoses:  ?Final diagnoses:  ?Suicidal ideation  ?Alcohol abuse  ?Substance abuse (HCC)  ?Homelessness  ? ? ?HPI:  William Powers 41 y.o., male patient presented [facility]  and admitted to the continuous assessment unit for overnight observation.  ? ?William Powers, 41 y.o., male patient seen face to face by this provider, consulted with Dr. Bronwen BettersLaubach; and chart reviewed on 02/25/22.  Patient presented to the ED on 02/23/2022 with alcohol intoxication and SI and was discharged. He represented to the ED on 02/24/2022. Patient reports he use to take Bivtary and Metoprolol but has not taken those medications in a long time. He is not interested in restarting medications at this time. He is also diagnosed with CHF. He denies any HF symptoms and does not take any medications to treat this condition.  ? ?Patient reports that [place] the [place].  He does receive a disability check each month.  Reports he uses alcohol (liquor and beer) roughly every day from the hours of 5-9 PM.  States during that time he is also gambling.  States most days, "I get drunk as shit".  He is unable to state how long he has been drinking but states roughly for years. He has never participated in any substance abuse treatment. He denies every having any IP psychiatric admissions.  He denies any history of alcohol withdrawal seizure or DTs.  He denies any withdrawal symptoms at this time. Upon admission BAL was 277. UDS positive for THC. Last CIWA score was 5.  He denies any health concerns.  ? ?During evaluation William Powers is sitting in his bed eating a snack.  He is alert/oriented x4 and cooperative.  He has normal speech and behavior.  He is attentive and makes good eye contact.  He endorses  depression.  Denies any concerns with appetite or sleep.  He does not appear to be responding to internal/external stimuli.  He denies AVH/HI.  He is logical and able to answer questions appropriately.  He endorses passive SI with no specific plan, intent, or access to means.  States when he was admitted he did say he would drink bleach but then states, "that was the alcohol talking".  Patient is seeking alcohol detox.  He is also requesting assistance to for halfway houses such as [facility]. ? ?PHQ 2-9:   ?Flowsheet Row [facility]  ?C-SSRS RISK CATEGORY High Risk Low Risk No Risk  ? ?  ?  ? ?Total Time spent with patient: 30 minutes ? ?Musculoskeletal  ?Strength & Muscle Tone: within normal limits ?Gait & Station: normal ?Patient leans: N/A ? ?Psychiatric Specialty Exam  ?Presentation ?General Appearance: Appropriate for Environment; Casual ? ?Eye Contact:Good ? ?Speech:Clear and Coherent; Normal Rate ? ?Speech Volume:Normal ? ?Handedness:Right ? ? ?Mood and Affect  ?Mood:Depressed ? ?Affect:Congruent ? ? ?Thought Process  ?Thought Processes:Coherent ? ?Descriptions of Associations:Intact ? ?Orientation:Full (Time, Place and Person) ? ?Thought Content:Logical ? Diagnosis of Schizophrenia or Schizoaffective disorder in past: No ?  ?Hallucinations:Hallucinations: None ? ?Ideas of Reference:None ? ?Suicidal Thoughts:Suicidal Thoughts: Yes, Passive ?SI Passive Intent and/or Plan: Without Plan; Without Intent;  Without Access to Means ? ?Homicidal Thoughts:Homicidal Thoughts: No ? ? ?Sensorium  ?Memory:Immediate Good; Recent Good; Remote Good ? ?Judgment:Fair ? ?Insight:Fair ? ? ?Executive Functions  ?Concentration:Good ? ?Attention Span:Good ? ?Recall:Good ? ?Fund of Knowledge:Good ? ?Language:Good ? ? ?Psychomotor Activity   ?Psychomotor Activity:Psychomotor Activity: Normal ? ? ?Assets  ?Assets:Communication Skills; Desire for Improvement; Financial Resources/Insurance; Physical Health; Resilience; Social Support ? ? ?Sleep  ?Sleep:Sleep: Good ? ? ?Nutritional Assessment (For OBS and FBC admissions only) ?Has the patient had a weight loss or gain of 10 pounds or more in the last 3 months?: No ?Has the patient had a decrease in food intake/or appetite?: No ?Does the patient have dental problems?: No ?Does the patient have eating habits or behaviors that may be indicators of an eating disorder including binging or inducing vomiting?: No ?Has the patient recently lost weight without trying?: 0 ?Has the patient been eating poorly because of a decreased appetite?: 0 ?Malnutrition Screening Tool Score: 0 ? ? ? ?Physical Exam ?Vitals and nursing note reviewed.  ?Constitutional:   ?   General: He is not in acute distress. ?   Appearance: Normal appearance. He is well-developed.  ?HENT:  ?   Head: Normocephalic and atraumatic.  ?Eyes:  ?   General:     ?   Right eye: No discharge.     ?   Left eye: No discharge.  ?   Conjunctiva/sclera: Conjunctivae normal.  ?Cardiovascular:  ?   Rate and Rhythm: Normal rate.  ?Pulmonary:  ?   Effort: Pulmonary effort is normal. No respiratory distress.  ?Musculoskeletal:     ?   General: No swelling. Normal range of motion.  ?   Cervical back: Normal range of motion.  ?Skin: ?   Coloration: Skin is not jaundiced or pale.  ?Neurological:  ?   Mental Status: He is alert and oriented to person, place, and time.  ?Psychiatric:     ?   Attention and Perception: Attention and perception normal.     ?   Mood and Affect: Mood is depressed.     ?   Speech: Speech normal.     ?   Behavior: Behavior normal. Behavior is cooperative.     ?   Thought Content: Thought content includes suicidal ideation. Thought content does not include suicidal plan.     ?   Cognition and Memory: Cognition normal.     ?   Judgment: Judgment  normal.  ? ?Review of Systems  ?Constitutional: Negative.   ?HENT: Negative.    ?Eyes: Negative.   ?Respiratory: Negative.    ?Cardiovascular: Negative.   ?Musculoskeletal: Negative.   ?Skin: Negative.   ?Neurological: Negative.   ?Psychiatric/Behavioral:  Positive for depression, substance abuse and suicidal ideas.   ? ?Blood pressure 104/76, pulse 89, temperature 98.7 ?F (37.1 ?C), temperature source Tympanic, resp. rate 18, SpO2 100 %. There is no height or weight on file to calculate BMI. ? ?Past Psychiatric History: see h&p  ? ?Is the patient at risk to self? Yes  ?Has the patient been a risk to self in the past 6 months? No .    ?Has the patient been a risk to self within the distant past? No   ?Is the patient a risk to others? No   ?Has the patient been a risk to others in the past 6 months? No   ?Has the patient been a risk to others within the distant past? No  ? ?Past Medical  History:  ?Past Medical History:  ?Diagnosis Date  ? Heart failure (HCC)   ? HFrEF (heart failure with reduced ejection fraction) (HCC)   ? 15% EF as of 2019  ? HIV (human immunodeficiency virus infection) (HCC)   ? Hypertension   ? Polysubstance abuse (HCC)   ? Pulmonary embolism (HCC)   ?  ?Past Surgical History:  ?Procedure Laterality Date  ? HERNIA REPAIR    ? ? ?Family History:  ?Family History  ?Problem Relation Age of Onset  ? Lupus Mother   ? HIV Mother   ? HIV Father   ? ? ?Social History:  ?Social History  ? ?Socioeconomic History  ? Marital status: Divorced  ?  Spouse name: Not on file  ? Number of children: Not on file  ? Years of education: Not on file  ? Highest education level: Not on file  ?Occupational History  ? Not on file  ?Tobacco Use  ? Smoking status: Some Days  ?  Types: Cigarettes  ? Smokeless tobacco: Never  ?Vaping Use  ? Vaping Use: Never used  ?Substance and Sexual Activity  ? Alcohol use: Yes  ? Drug use: Yes  ?  Types: Marijuana  ? Sexual activity: Not on file  ?Other Topics Concern  ? Not on file   ?Social History Narrative  ? Not on file  ? ?Social Determinants of Health  ? ?Financial Resource Strain: Not on file  ?Food Insecurity: Not on file  ?Transportation Needs: Not on file  ?Physical Activity: Not on

## 2022-02-25 NOTE — ED Notes (Signed)
Pt sleeping no distress or sleep disturbance noted. 

## 2022-02-26 DIAGNOSIS — F1910 Other psychoactive substance abuse, uncomplicated: Secondary | ICD-10-CM | POA: Diagnosis not present

## 2022-02-26 DIAGNOSIS — R45851 Suicidal ideations: Secondary | ICD-10-CM | POA: Diagnosis not present

## 2022-02-26 DIAGNOSIS — F1010 Alcohol abuse, uncomplicated: Secondary | ICD-10-CM | POA: Diagnosis not present

## 2022-02-26 DIAGNOSIS — I110 Hypertensive heart disease with heart failure: Secondary | ICD-10-CM | POA: Diagnosis not present

## 2022-02-26 NOTE — ED Notes (Signed)
Pt sleeping@this time. Breathing even and unlabored. Will continue to monitor for safety 

## 2022-02-26 NOTE — ED Provider Notes (Addendum)
FBC/OBS ASAP Discharge Summary ? ?Date and Time: 02/26/2022 9:59 AM  ?Name: William Powers  ?MRN:  [id number]  ? ?Discharge Diagnoses:  ?Final diagnoses:  ?Suicidal ideation  ?Alcohol abuse  ?Substance abuse (HCC)  ?Homelessness  ? ? ?Subjective:  ?Patient seen and chart reviewed- most recent CIWA is 0. He has been appropriate with staff and peers on the unit. Patient denied SI/HI/AVH and requests to be discharged. He denies tremulousness, anxiety, headache, diaphoresis, palpitations, GI upset. Patient states that he needs to get to an appointment that he has this morning at 10 AM. ? ? ?Stay Summary:  ?41 yo male with a history of CHF and substance abuse who [facility]  with SI with a plan to drink bleach; [facility] and placed on CIWA protocol. On 5/9 patient reported desire for continued detox and reported passive SI and [facility] and [facility]. Etoh 277 on presentation; UDS+THC. Once [facility], patient denied SI plan or intent, HI and AVH. He expressed interest in detox and possible residential rehabs. On the morning of 5/10 patient requested discharge. CIWA scores from 5/9 and 5/10 had been consistently 0. Patient was discharged per request as there was no indication for IVC or inpatient hospitalization.  ? ?Total Time spent with patient: 15 minutes ? ?Past Psychiatric History: polysubstance abuse ?Past Medical History:  ?Past Medical History:  ?Diagnosis Date  ? Heart failure (HCC)   ? HFrEF (heart failure with reduced ejection fraction) (HCC)   ? 15% EF as of 2019  ? HIV (human immunodeficiency virus infection) (HCC)   ? Hypertension   ? Polysubstance abuse (HCC)   ? Pulmonary embolism (HCC)   ?  ?Past Surgical History:  ?Procedure Laterality Date  ? HERNIA REPAIR    ? ?Family History:  ?Family History  ?Problem Relation Age of Onset  ? Lupus Mother   ? HIV Mother   ? HIV Father    ? ?Family Psychiatric History:  ?Denies h/o psychiatric illness ?Denies h/o substance use ?Denies h/o completed or attempted suicides ?  ?Social History:  ?Social History  ? ?Substance and Sexual Activity  ?Alcohol Use Yes  ?   ?Social History  ? ?Substance and Sexual Activity  ?Drug Use Yes  ? Types: Marijuana  ?  ?Social History  ? ?Socioeconomic History  ? Marital status: Divorced  ?  Spouse name: Not on file  ? Number of children: Not on file  ? Years of education: Not on file  ? Highest education level: Not on file  ?Occupational History  ? Not on file  ?Tobacco Use  ? Smoking status: Some Days  ?  Types: Cigarettes  ? Smokeless tobacco: Never  ?Vaping Use  ? Vaping Use: Never used  ?Substance and Sexual Activity  ? Alcohol use: Yes  ? Drug use: Yes  ?  Types: Marijuana  ? Sexual activity: Not on file  ?Other Topics Concern  ? Not on file  ?Social History Narrative  ? Not on file  ? ?Social Determinants of Health  ? ?Financial Resource Strain: Not on file  ?Food Insecurity: Not on file  ?Transportation Needs: Not on file  ?Physical Activity: Not on file  ?Stress: Not on file  ?Social Connections: Not on file  ? ?SDOH:  ?SDOH Screenings  ? ?Alcohol Screen: Not on file  ?Depression (PHQ2-9): Low Risk   ?  PHQ-2 Score: 0  ?Financial Resource Strain: Not on file  ?Food Insecurity: Not on file  ?Housing: Not on file  ?Physical Activity: Not on file  ?Social Connections: Not on file  ?Stress: Not on file  ?Tobacco Use: High Risk  ? Smoking Tobacco Use: Some Days  ? Smokeless Tobacco Use: Never  ? Passive Exposure: Not on file  ?Transportation Needs: Not on file  ? ? ?Tobacco Cessation:  Prescription not provided because: declined ? ?Current Medications:  ?Current Facility-Administered Medications  ?Medication Dose Route Frequency Provider Last Rate Last Admin  ? acetaminophen (TYLENOL) tablet 650 mg  650 mg Oral Q6H PRN [name], NP      ? alum & mag hydroxide-simeth (MAALOX/MYLANTA) 200-200-20 MG/5ML suspension  30 mL  30 mL Oral Q4H PRN [name], NP      ? hydrOXYzine (ATARAX) tablet 25 mg  25 mg Oral Q6H PRN [name], NP      ? loperamide (IMODIUM) capsule 2-4 mg  2-4 mg Oral PRN [name], NP      ? LORazepam (ATIVAN) tablet 1 mg  1 mg Oral Q6H PRN [name], NP      ? magnesium hydroxide (MILK OF MAGNESIA) suspension 30 mL  30 mL Oral Daily PRN [name], NP      ? multivitamin with minerals tablet 1 tablet  1 tablet Oral Daily [name], NP   1 tablet at 02/26/22 6073  ? ondansetron (ZOFRAN-ODT) disintegrating tablet 4 mg  4 mg Oral Q6H PRN [name], NP      ? potassium chloride SA (KLOR-CON M) CR tablet 20 mEq  20 mEq Oral BID Estella Husk, MD   20 mEq at 02/26/22 7106  ? thiamine (B-1) injection 100 mg  100 mg Intramuscular Once [name], NP      ? thiamine tablet 100 mg  100 mg Oral Daily [name], NP   100 mg at 02/26/22 2694  ? ?Current Outpatient Medications  ?Medication Sig Dispense Refill  ? BIKTARVY 50-200-25 MG TABS tablet Take 1 tablet by mouth daily. 30 tablet 0  ? metoprolol succinate (TOPROL-XL) 50 MG 24 hr tablet Take 1 tablet by mouth 2 times daily. 60 tablet 0  ? ? ?PTA Medications: (Not in a hospital admission) ? ? ?Musculoskeletal  ?Strength & Muscle Tone: within normal limits ?Gait & Station: normal ?Patient leans: N/A ? ?Psychiatric Specialty Exam  ?Presentation  ?General Appearance: Appropriate for Environment; Casual ? ?Eye Contact:Good ? ?Speech:Clear and Coherent; Normal Rate ? ?Speech Volume:Normal ? ?Handedness:Right ? ? ?Mood and Affect  ?Mood:-- ("better") ? ?Affect:Appropriate; Congruent ? ? ?Thought Process  ?Thought Processes:Coherent; Goal Directed; Linear ? ?Descriptions of Associations:Intact ? ?Orientation:Full (Time, Place and Person) ? ?Thought Content:WDL; Logical ? Diagnosis of Schizophrenia or Schizoaffective disorder in past: No ?  ? Hallucinations:Hallucinations: None ? ?Ideas of Reference:None ? ?Suicidal Thoughts:Suicidal  Thoughts: No ?SI Passive Intent and/or Plan: Without Plan; Without Intent; Without Access to Means ? ?Homicidal Thoughts:Homicidal Thoughts: No ? ? ?Sensorium  ?Memory:Immediate Good; Recent Good; Remote Good ? ?Judgment:Fair ? ?Insight:Fair ? ? ?Executive Functions  ?Concentration:Good ? ?Attention Span:Good ? ?Recall:Good ? ?Fund of Knowledge:Good ? ?Language:Good ? ? ?Psychomotor Activity  ?Psychomotor Activity:Psychomotor Activity: Normal ? ? ?Assets  ?Assets:Communication Skills; Desire for Improvement; Social Support; Resilience ? ? ?Sleep  ?Sleep:Sleep: Good ? ? ?Nutritional Assessment (For OBS and FBC admissions only) ?Has the patient had a weight loss or gain of 10 pounds or more in the  last 3 months?: No ?Has the patient had a decrease in food intake/or appetite?: No ?Does the patient have dental problems?: No ?Does the patient have eating habits or behaviors that may be indicators of an eating disorder including binging or inducing vomiting?: No ?Has the patient recently lost weight without trying?: 0 ?Has the patient been eating poorly because of a decreased appetite?: 0 ?Malnutrition Screening Tool Score: 0 ? ? ? ?Physical Exam  ?Physical Exam ?Constitutional:   ?   Appearance: Normal appearance. He is normal weight.  ?HENT:  ?   Head: Normocephalic and atraumatic.  ?Eyes:  ?   Extraocular Movements: Extraocular movements intact.  ?Pulmonary:  ?   Effort: Pulmonary effort is normal.  ?Neurological:  ?   General: No focal deficit present.  ?   Mental Status: He is alert and oriented to person, place, and time.  ?Psychiatric:     ?   Attention and Perception: Attention and perception normal.     ?   Speech: Speech normal.     ?   Behavior: Behavior normal. Behavior is cooperative.     ?   Thought Content: Thought content normal.  ? ?Review of Systems  ?Constitutional:  Negative for chills and fever.  ?HENT:  Negative for hearing loss.   ?Eyes:  Negative for discharge and redness.  ?Respiratory:  Negative  for cough.   ?Cardiovascular:  Negative for chest pain and palpitations.  ?Gastrointestinal:  Negative for abdominal pain.  ?Musculoskeletal:  Negative for myalgias.  ?Neurological:  Negative for tremor

## 2022-02-26 NOTE — Progress Notes (Signed)
Received William Powers this AM asleep in his bed, he woke up for breakfast, ate and informed this Clinical research associate he has an appointment in [place] at 1000 hrs this morning. He stated a friend is coming to pick him up. He lack to informed the staff of this prearranged appointment upon this arrival to the [facility] and later to [facility]. He was presented with his AVS, a bus pass and retrieved his personal belongings. He was discharged per his request without incident.   ?

## 2022-02-27 ENCOUNTER — Ambulatory Visit (HOSPITAL_COMMUNITY)
Admission: EM | Admit: 2022-02-27 | Discharge: 2022-02-27 | Disposition: A | Discharge status: Home / Self Care | Payer: Medicaid Other | Attending: Psychiatry | Admitting: Psychiatry

## 2022-02-27 ENCOUNTER — Other Ambulatory Visit: Payer: Self-pay

## 2022-02-27 DIAGNOSIS — F1020 Alcohol dependence, uncomplicated: Secondary | ICD-10-CM | POA: Diagnosis not present

## 2022-02-27 DIAGNOSIS — F1910 Other psychoactive substance abuse, uncomplicated: Secondary | ICD-10-CM | POA: Insufficient documentation

## 2022-02-27 DIAGNOSIS — F332 Major depressive disorder, recurrent severe without psychotic features: Secondary | ICD-10-CM | POA: Insufficient documentation

## 2022-02-27 DIAGNOSIS — R45851 Suicidal ideations: Secondary | ICD-10-CM | POA: Diagnosis not present

## 2022-02-27 DIAGNOSIS — Z5900 Homelessness unspecified: Secondary | ICD-10-CM | POA: Insufficient documentation

## 2022-02-27 DIAGNOSIS — Z20822 Contact with and (suspected) exposure to covid-19: Secondary | ICD-10-CM | POA: Insufficient documentation

## 2022-02-27 LAB — CBC WITH DIFFERENTIAL/PLATELET
Abs Immature Granulocytes: 0.02 10*3/uL (ref 0.00–0.07)
Basophils Absolute: 0 10*3/uL (ref 0.0–0.1)
Basophils Relative: 1 %
Eosinophils Absolute: 0.2 10*3/uL (ref 0.0–0.5)
Eosinophils Relative: 4 %
HCT: 41.3 % (ref 39.0–52.0)
Hemoglobin: 14.2 g/dL (ref 13.0–17.0)
Immature Granulocytes: 0 %
Lymphocytes Relative: 43 %
Lymphs Abs: 2 10*3/uL (ref 0.7–4.0)
MCH: 32 pg (ref 26.0–34.0)
MCHC: 34.4 g/dL (ref 30.0–36.0)
MCV: 93 fL (ref 80.0–100.0)
Monocytes Absolute: 0.4 10*3/uL (ref 0.1–1.0)
Monocytes Relative: 8 %
Neutro Abs: 2 10*3/uL (ref 1.7–7.7)
Neutrophils Relative %: 44 %
Platelets: 293 10*3/uL (ref 150–400)
RBC: 4.44 MIL/uL (ref 4.22–5.81)
RDW: 15.7 % — ABNORMAL HIGH (ref 11.5–15.5)
WBC: 4.6 10*3/uL (ref 4.0–10.5)
nRBC: 0 % (ref 0.0–0.2)

## 2022-02-27 LAB — POCT URINE DRUG SCREEN - MANUAL ENTRY (I-SCREEN)
POC Amphetamine UR: NOT DETECTED
POC Buprenorphine (BUP): NOT DETECTED
POC Cocaine UR: NOT DETECTED
POC Marijuana UR: POSITIVE — AB
POC Methadone UR: NOT DETECTED
POC Methamphetamine UR: NOT DETECTED
POC Morphine: NOT DETECTED
POC Oxazepam (BZO): NOT DETECTED
POC Oxycodone UR: NOT DETECTED
POC Secobarbital (BAR): NOT DETECTED

## 2022-02-27 LAB — RESP PANEL BY RT-PCR (FLU A&B, COVID) ARPGX2
Influenza A by PCR: NEGATIVE
Influenza B by PCR: NEGATIVE
SARS Coronavirus 2 by RT PCR: NEGATIVE

## 2022-02-27 LAB — COMPREHENSIVE METABOLIC PANEL
ALT: 30 U/L (ref 0–44)
AST: 36 U/L (ref 15–41)
Albumin: 3.7 g/dL (ref 3.5–5.0)
Alkaline Phosphatase: 63 U/L (ref 38–126)
Anion gap: 6 (ref 5–15)
BUN: 6 mg/dL (ref 6–20)
CO2: 28 mmol/L (ref 22–32)
Calcium: 9 mg/dL (ref 8.9–10.3)
Chloride: 106 mmol/L (ref 98–111)
Creatinine, Ser: 1 mg/dL (ref 0.61–1.24)
GFR, Estimated: >60 mL/min (ref >60)
Glucose, Bld: 91 mg/dL (ref 70–99)
Potassium: 4.1 mmol/L (ref 3.5–5.1)
Sodium: 140 mmol/L (ref 135–145)
Total Bilirubin: 0.8 mg/dL (ref 0.3–1.2)
Total Protein: 7.9 g/dL (ref 6.5–8.1)

## 2022-02-27 LAB — MAGNESIUM: Magnesium: 2.2 mg/dL (ref 1.7–2.4)

## 2022-02-27 LAB — TSH: TSH: 2.17 u[IU]/mL (ref 0.350–4.500)

## 2022-02-27 LAB — POC SARS CORONAVIRUS 2 AG: SARSCOV2ONAVIRUS 2 AG: NEGATIVE

## 2022-02-27 LAB — ETHANOL: Alcohol, Ethyl (B): 104 mg/dL — ABNORMAL HIGH (ref <10)

## 2022-02-27 MED ORDER — ACETAMINOPHEN 325 MG PO TABS: 650.0000 mg | ORAL_TABLET | Freq: Four times a day (QID) | ORAL | Status: DC | PRN

## 2022-02-27 MED ORDER — LORAZEPAM 1 MG PO TABS: 1.0000 mg | ORAL_TABLET | Freq: Four times a day (QID) | ORAL | Status: DC | PRN

## 2022-02-27 MED ORDER — LORAZEPAM 1 MG PO TABS: 1.0000 mg | ORAL_TABLET | Freq: Every day | ORAL | Status: DC

## 2022-02-27 MED ORDER — LOPERAMIDE HCL 2 MG PO CAPS: 2.0000 mg | ORAL_CAPSULE | ORAL | Status: DC | PRN

## 2022-02-27 MED ORDER — ADULT MULTIVITAMIN W/MINERALS CH: 1.0000 | ORAL_TABLET | Freq: Every day | ORAL | Status: DC

## 2022-02-27 MED ORDER — HYDROXYZINE HCL 25 MG PO TABS: 25.0000 mg | ORAL_TABLET | Freq: Four times a day (QID) | ORAL | Status: DC | PRN

## 2022-02-27 MED ORDER — ONDANSETRON 4 MG PO TBDP: 4.0000 mg | ORAL_TABLET | Freq: Four times a day (QID) | ORAL | Status: DC | PRN

## 2022-02-27 MED ORDER — LORAZEPAM 1 MG PO TABS: 1.0000 mg | ORAL_TABLET | Freq: Three times a day (TID) | ORAL | Status: DC

## 2022-02-27 MED ORDER — THIAMINE HCL 100 MG PO TABS: 100.0000 mg | ORAL_TABLET | Freq: Every day | ORAL | Status: DC

## 2022-02-27 MED ORDER — LORAZEPAM 1 MG PO TABS: 1.0000 mg | ORAL_TABLET | Freq: Four times a day (QID) | ORAL | Status: DC

## 2022-02-27 MED ORDER — THIAMINE HCL 100 MG/ML IJ SOLN: 100.0000 mg | Freq: Once | INTRAMUSCULAR | Status: DC

## 2022-02-27 MED ORDER — ALUM & MAG HYDROXIDE-SIMETH 200-200-20 MG/5ML PO SUSP: 30.0000 mL | ORAL | Status: DC | PRN

## 2022-02-27 MED ORDER — MAGNESIUM HYDROXIDE 400 MG/5ML PO SUSP: 30.0000 mL | Freq: Every day | ORAL | Status: DC | PRN

## 2022-02-27 MED ORDER — LORAZEPAM 1 MG PO TABS
1.0000 mg | ORAL_TABLET | Freq: Two times a day (BID) | ORAL | Status: DC
Start: 2022-03-01 — End: 2022-02-27

## 2022-02-27 NOTE — Discharge Instructions (Addendum)
Patient is instructed prior to discharge to: ? Take all medications as prescribed by his/her mental healthcare provider. ?Report any adverse effects and or reactions from the medicines to his/her outpatient provider promptly. ?Keep all scheduled appointments, to ensure that you are getting refills on time and to avoid any interruption in your medication.  If you are unable to keep an appointment call to reschedule.  Be sure to follow-up with resources and follow-up appointments provided.  ?Patient has been instructed & cautioned: To not engage in alcohol and or illegal drug use while on prescription medicines. ?In the event of worsening symptoms, patient is instructed to call the crisis hotline, 911 and or go to the nearest ED for appropriate evaluation and treatment of symptoms. ?To follow-up with his/her primary care provider for your other medical issues, concerns and or health care needs. ?  ? ? ?Homeless Shelter List: ? ?  ? ?[place] [facility]) ? ?[place], [place] ? ?Phone: [phone] ? ?  ? ?[facility] ? ?[place] [address] [place], [place] [address] ? ?Phone: [phone] ? ?  ? ?[place] (Women only) ? ?[address] [place], [place] [address] ? ?Phone: [phone] ? ?  ? ?[place] ? ?[address], [place] [address] ? ?Phone: [phone] ? ?  ? ?[place]: ? ?[address] ? ?[place], [place] [address] ? ?Phone: [phone] ? ?  ? ?[place] [address] [place], [place] [address] ? ?(Check in at 6:00PM for placement at a local shelter) ? ?Phone: [phone] ? ? ?Substance Abuse Treatment Resources listed Below: ? ?[facility] ?- Admissions are currently completed Monday through Friday at [place]; both appointments and walk-ins are accepted.  Any individual that is a [facility] resident may present  for a substance abuse screening and assessment for admission.  A person may be referred by numerous sources or self-refer.   Potential clients will be screened for medical necessity and appropriateness for the program.  Clients must meet criteria for high-intensity residential treatment services.  If clinically appropriate, a client will continue with the comprehensive clinical assessment and intake process, as well as enrollment in the [place]. ? ?Address: [address] ?[place], [place] [address] ?Admin Hours: Mon-Fri 8AM to [place] Hours: 24/7 ?Phone: [phone] ?Fax: [phone] ? ?[facility] [place] ?Address: [address], [place], [place] [address] ?Behavioral Health Urgent Care [facility]) ?Hours: 24/7 ?Phone: [phone] ?Fax: [phone] ? ?[facility] ([facility]): (offers outpatient therapy and intensive outpatient substance abuse therapy).  ?[address] [place], [place] [address] ?Phone: [phone] ? ?[facility]: Offers FREE recovery skills classes, support groups, 1:1 Peer Support, and Compeer Classes. ?[address] [place], [place] [address] ?Phone: [phone] (Call to complete intake).  ? ?[facility] ?Men's Division ?[place], [place] [address] ?Phone: [phone] ext [phone] ?The [employer] provides food, shelter and other programs and services to the homeless men of [place] through our [employer]. ? ?By offering safe shelter, three meals a day, clean clothing, Biblical counseling, financial planning, vocational training, GED/education and employment assistance, we've helped mend the shattered lives of many homeless men since opening in 1974. ? ?We have approximately 267 beds available, with a max of 312 beds including mats for emergency situations and currently house an average  of 270 men a night. ? ?Prospective Client Check-In Information ?Photo ID Required (State/ Out of  State/ [facility]) - if photo ID is not available, clients are required to have a printout of a police/sheriff's criminal history report. ?Help out with chores around the [place]. ?No sex offender of any type (pending, charged, registered and/or any other sex related offenses) will be permitted to check in. ?Must be willing to abide by all rules, regulations, and policies established by the [employer]. ?The following will be provided - shelter, food, clothing, and biblical counseling. ?If you or someone you know is in need of assistance at our [facility] shelter in [place], [place], please call [phone] ext. [phone]. ? ?[facility]-will provide timely access to mental health services for children and adolescents (4-17) and adults presenting in a mental health crisis. The program is designed for those who need urgent Behavioral Health or Substance Use treatment and are not experiencing a medical crisis that would typically require an emergency room visit.  ?  ?[place] ?[place], [place] [address] ?Phone: [phone] ?[email or web address] ? ?[place]: Phone#: [phone] ? ?[facility] ?SA Intensive Outpatient Program (SAIOP) means structured individual and group addiction activities and services that are provided at an outpatient program designed to assist adult and adolescent consumers to begin recovery and learn skills for recovery maintenance. The [place] program is offered at least 3 hours a day, 3 days a week.SAIOP services shall include a structured program consisting of, but not limited to, the following services: ?Individual counseling and support; Group counseling and support; Family counseling, training or support; Biochemical assays to identify recent drug use (e.g., urine drug screens); Strategies for relapse prevention to include community and social support systems in treatment; Life skills; Crisis contingency  planning; Disease Management; and Treatment support activities that have been adapted or specifically designed for persons with physical disabilities, or persons with co-occurring disorders of mental illness and substance abuse/dependence or mental retardation/developmental disability and substance abuse/dependence. ?Phone: [phone]  ?Address:  ? ?The [facility] will also offer the following outpatient services: (Monday through Friday 8am-5pm) ?  ?Partial Hospitalization Program (PHP) ?Substance Abuse Intensive Outpatient Program (SA-IOP) ?Group Therapy ?Medication Management ?Peer Living Room ?We also provide (24/7):  ?Assessments: Our mental health clinician and providers will conduct a focused mental health evaluation, assessing for immediate safety concerns and further mental health needs. ?Referral: Our team will provide resources and help connect to community based mental health treatment, when indicated, including psychotherapy, psychiatry, and other specialized behavioral health or substance use disorder services (for those not already in treatment). ?Transitional Care: Our team providers in person bridging and/or telephonic follow-up during the patient's transition to outpatient services.  ?[facility] ?24-Hour Call Center: ?[phone] ?Behavioral Health Crisis Line: ?[phone] ? ?

## 2022-02-27 NOTE — ED Notes (Signed)
Pt refused breakfast but wanted some juice. ?

## 2022-02-27 NOTE — ED Provider Notes (Signed)
FBC/OBS ASAP Discharge Summary ? ?Date and Time: 02/27/2022 8:39 AM  ?Name: William Powers  ?MRN:  [id number]  ? ?Discharge Diagnoses:  ?Final diagnoses:  ?Suicidal ideation  ?Substance abuse (HCC)  ?Homelessness  ? ? ?Subjective: Patient states "I am ready to get sober, yesterday I was just being stubborn, I know I need to stop drinking."  William Powers was discharged from facility based crisis area at [facility] behavioral health yesterday. ? ?Patient is reassessed, face-to-face, by nurse practitioner.  He is reclined in observation area upon my approach, appears asleep.  Patient is easily awakened.  He is alert and oriented, pleasant and cooperative during assessment. ? ?Patient is insightful regarding alcohol use disorder.  He reports readiness to stop alcohol use and seek residential substance use treatment. ? ?Recent stressors include homelessness and residing in hotels as well as alcohol use.  He reports daily alcohol use.  Amount of alcohol varies. ? ?William Powers has been diagnosed with polysubstance use disorder, adjustment disorder and suicidal ideation.  He is not currently linked with outpatient psychiatry, verbalizes plan to follow-up with outpatient psychiatry for both medication management and counseling moving forward. ? ?Plan denies suicidal and homicidal ideations currently.  He easily contracts verbally for safety with this Clinical research associate.  He denies auditory and visual hallucinations.  There is no evidence of delusional thought content and no indication that patient is responding to internal stimuli. ? ?William Powers currently resides in a hotel in [place], denies access to weapons.  He receives SSI income.  He endorses average sleep and appetite. ? ?Patient offered support and encouragement.  He declines any person to contact for collateral information at this time.  Verbalizes plan to follow-up with residential substance use treatment options on today. ? ? ?Stay Summary: HPI from Today at 0401am: William Powers, 41  y.o male,  present to [facility] by [employer], for suicidal ideation,  pt was see yesterday at [facility], but decided to leave,  according to pt he wanted to kill himself tonight,  report he has couple of plan, he can kill himself in 1000 ways,  but did not give any plans.   Pt seen to be impaired or under the influence.   ?  ?Observation of patient,  he Is alert and oriented x4, speech clear,  mood anxious affect congruent with mood,  pt seen to be very manipulative and is telling us what we want to hear.   Pt seen to be homeless and is looking a place to stay.   Pt report he drank 2 gallons of vodka tonight,  he report he is suicidal but didn't give a plan.  Pt denies HI, AVH. According to patient I just want to get clean right now.  ?  ?  ?Recommend inpatient observation  ? ?Total Time spent with patient: 20 minutes ? ?Past Psychiatric History: polysubstance abuse ?Past Medical History:  ?Past Medical History:  ?Diagnosis Date  ? Heart failure (HCC)   ? HFrEF (heart failure with reduced ejection fraction) (HCC)   ? 15% EF as of 2019  ? HIV (human immunodeficiency virus infection) (HCC)   ? Hypertension   ? Polysubstance abuse (HCC)   ? Pulmonary embolism (HCC)   ?  ?Past Surgical History:  ?Procedure Laterality Date  ? HERNIA REPAIR    ? ?Family History:  ?Family History  ?Problem Relation Age of Onset  ? Lupus Mother   ? HIV Mother   ? HIV Father   ? ?Family Psychiatric History: none reported ?Social  History:  ?Social History  ? ?Substance and Sexual Activity  ?Alcohol Use Yes  ?   ?Social History  ? ?Substance and Sexual Activity  ?Drug Use Yes  ? Types: Marijuana  ?  ?Social History  ? ?Socioeconomic History  ? Marital status: Divorced  ?  Spouse name: Not on file  ? Number of children: Not on file  ? Years of education: Not on file  ? Highest education level: Not on file  ?Occupational History  ? Not on file  ?Tobacco Use  ? Smoking status: Some Days  ?  Types: Cigarettes  ? Smokeless tobacco: Never  ?Vaping Use  ? Vaping  Use: Never used  ?Substance and Sexual Activity  ? Alcohol use: Yes  ? Drug use: Yes  ?  Types: Marijuana  ? Sexual activity: Not on file  ?Other Topics Concern  ? Not on file  ?Social History Narrative  ? Not on file  ? ?Social Determinants of Health  ? ?Financial Resource Strain: Not on file  ?Food Insecurity: Not on file  ?Transportation Needs: Not on file  ?Physical Activity: Not on file  ?Stress: Not on file  ?Social Connections: Not on file  ? ?SDOH:  ?SDOH Screenings  ? ?Alcohol Screen: Not on file  ?Depression (PHQ2-9): Low Risk   ? PHQ-2 Score: 0  ?Financial Resource Strain: Not on file  ?Food Insecurity: Not on file  ?Housing: Not on file  ?Physical Activity: Not on file  ?Social Connections: Not on file  ?Stress: Not on file  ?Tobacco Use: High Risk  ? Smoking Tobacco Use: Some Days  ? Smokeless Tobacco Use: Never  ? Passive Exposure: Not on file  ?Transportation Needs: Not on file  ? ? ?Tobacco Cessation:  A prescription for an FDA-approved tobacco cessation medication was offered at discharge and the patient refused ? ?Current Medications:  ?Current Facility-Administered Medications  ?Medication Dose Route Frequency Provider Last Rate Last Admin  ? acetaminophen (TYLENOL) tablet 650 mg  650 mg Oral Q6H PRN Sindy GuadeloupeWilliams, Roy, NP      ? alum & mag hydroxide-simeth (MAALOX/MYLANTA) 200-200-20 MG/5ML suspension 30 mL  30 mL Oral Q4H PRN Sindy GuadeloupeWilliams, Roy, NP      ? hydrOXYzine (ATARAX) tablet 25 mg  25 mg Oral Q6H PRN Sindy GuadeloupeWilliams, Roy, NP      ? loperamide (IMODIUM) capsule 2-4 mg  2-4 mg Oral PRN Sindy GuadeloupeWilliams, Roy, NP      ? LORazepam (ATIVAN) tablet 1 mg  1 mg Oral Q6H PRN Sindy GuadeloupeWilliams, Roy, NP      ? LORazepam (ATIVAN) tablet 1 mg  1 mg Oral QID Sindy GuadeloupeWilliams, Roy, NP      ? Followed by  ? [START ON 02/28/2022] LORazepam (ATIVAN) tablet 1 mg  1 mg Oral TID Sindy GuadeloupeWilliams, Roy, NP      ? Followed by  ? [START ON 03/01/2022] LORazepam (ATIVAN) tablet 1 mg  1 mg Oral BID Sindy GuadeloupeWilliams, Roy, NP      ? Followed by  ? Melene Muller[START ON 03/03/2022]  LORazepam (ATIVAN) tablet 1 mg  1 mg Oral Daily Sindy GuadeloupeWilliams, Roy, NP      ? magnesium hydroxide (MILK OF MAGNESIA) suspension 30 mL  30 mL Oral Daily PRN Sindy GuadeloupeWilliams, Roy, NP      ? multivitamin with minerals tablet 1 tablet  1 tablet Oral Daily Sindy GuadeloupeWilliams, Roy, NP      ? ondansetron (ZOFRAN-ODT) disintegrating tablet 4 mg  4 mg Oral Q6H PRN Sindy GuadeloupeWilliams, Roy, NP      ?  thiamine (B-1) injection 100 mg  100 mg Intramuscular Once [name], NP      ? Melene Muller ON 02/28/2022] thiamine tablet 100 mg  100 mg Oral Daily [name], NP      ? ?Current Outpatient Medications  ?Medication Sig Dispense Refill  ? BIKTARVY 50-200-25 MG TABS tablet Take 1 tablet by mouth daily. 30 tablet 0  ? metoprolol succinate (TOPROL-XL) 50 MG 24 hr tablet Take 1 tablet by mouth 2 times daily. 60 tablet 0  ? ? ?PTA Medications: (Not in a hospital admission) ? ? ?Musculoskeletal  ?Strength & Muscle Tone: within normal limits ?Gait & Station: normal ?Patient leans: N/A ? ?Psychiatric Specialty Exam  ?Presentation  ?General Appearance: Appropriate for Environment; Casual ? ?Eye Contact:Good ? ?Speech:Clear and Coherent; Normal Rate ? ?Speech Volume:Normal ? ?Handedness:Right ? ? ?Mood and Affect  ?Mood:Euthymic ? ?Affect:Appropriate; Congruent ? ? ?Thought Process  ?Thought Processes:Coherent; Goal Directed; Linear ? ?Descriptions of Associations:Intact ? ?Orientation:Full (Time, Place and Person) ? ?Thought Content:Logical; WDL ? Diagnosis of Schizophrenia or Schizoaffective disorder in past: No ?  ? Hallucinations:Hallucinations: None ? ?Ideas of Reference:None ? ?Suicidal Thoughts:Suicidal Thoughts: No ?SI Active Intent and/or Plan: With Plan ? ?Homicidal Thoughts:Homicidal Thoughts: No ? ? ?Sensorium  ?Memory:Immediate Good; Recent Good ? ?Judgment:Fair ? ?Insight:Fair ? ? ?Executive Functions  ?Concentration:Good ? ?Attention Span:Good ? ?Recall:Good ? ?Fund of Knowledge:Good ? ?Language:Good ? ? ?Psychomotor Activity  ?Psychomotor  Activity:Psychomotor Activity: Normal ? ? ?Assets  ?Assets:Communication Skills; Desire for Improvement; Financial Resources/Insurance; Intimacy; Leisure Time; Physical Health; Resilience; Social Support ? ? ?Sleep  ?Sleep:Slee

## 2022-02-27 NOTE — ED Notes (Signed)
Patient is alert and oriented X 4, denies SI, HI and AVH. Patient received and After visit summary with community resources to follow up with substance abuse treatment facilities. Patient received a bus transportation ticket. ?

## 2022-02-27 NOTE — BH Assessment (Signed)
Comprehensive Clinical Assessment (CCA) Note ? ?02/27/2022 ?Luretha MurphyWayne Tyrone Guinn ?[id number] ? ?Disposition: Sindy Guadeloupeoy Williams, NP recommends pt [facility].  ? ?The patient demonstrates the following risk factors for suicide: Chronic risk factors for suicide include: psychiatric disorder of Major Depressive Disorder, recurrent, severe and substance use disorder. Acute risk factors for suicide include: recent discharge from inpatient psychiatry and Pt reports, he's suicidal with plans . Protective factors for this patient include:  None . Considering these factors, the overall suicide risk at this point appears to be . Patient is appropriate for outpatient follow up. ? ?William CapuchinWayne T. Powers is a 41 year old male who presents voluntary and unaccompanied to [facility]. Clinician asked the pt, "what brought you to the hospital?" Pt [facility] Pt reports, he related to the presenters life experiences. Pt reports, he wanted to kill himself and has a couple of plans, pt then reports he can kill himself in 1000 ways. Pt did not disclose how he was going to kill himself. Pt reports, hearing "just end it." Pt reports, hearing voice one other time. Pt reports, he has a court date on Mar 12, 2022 for felony possession of Cocaine and Marijuana. Pt denies, HI, self-injurious behaviors.  ? ?Pt reports, drinking 2 gallons of Seagram[employer] Vodka tonight. Pt's BAL is pending. Pt denies, being linked to OPT resources (medication management and/or counseling.)  ? ?Pt presents alert laying on the assessment couch with normal speech. Pt's mood, affect was depressed. Pt's insight was poor. Pt's judgement was impaired. Pt reports, if discharged he can not contract for safety.  ? ?Diagnosis: Major Depressive Disorder, recurrent, severe. ?                  Alcohol use Disorder, severe. ? ?*Pt denies, supports.* ? ?Chief Complaint: No chief complaint on file. ? ?Visit Diagnosis:   ? ? ?CCA  Screening, Triage and Referral (STR) ? ?Patient Reported Information ?How did you hear about us? Legal System ? ?What Is the Reason for Your Visit/Call Today? [facility], [facility]. Pt reports, while he was there he attended AA and related to the life experiences from presenters. Pt reports, he wanted to kill himself tonight. Pt reports, he has a couple of plans, he can kill himself in 1000 ways. Pt did not disclose a plan during the assessment. Pt reports, hearing a voice saying, "just end it." Pt reports, he heard voices one other time. Pt reports, he can not contract for safety if discharged. Pt reports, drinking 2 gallons of Seagrams Vodka tonight. Pt denies, HI, self-injurious behaviors. ? ?How Long Has This Been Causing You Problems? 1-6 months ? ?What Do You Feel Would Help You the Most Today? Alcohol or Drug Use Treatment; Treatment for Depression or other mood problem; Housing Assistance ? ? ?Have You Recently Had Any Thoughts About Hurting Yourself? Yes ? ?Are You Planning to Commit Suicide/Harm Yourself At This time? -- (Pt did not disclose a plan.) ? ? ?Have you Recently Had Thoughts About Hurting Someone Karolee Ohslse? No ? ?Are You Planning to Harm Someone at This Time? No ? ?Explanation: No data recorded ? ?Have You Used Any Alcohol or Drugs in the Past 24 Hours? Yes ? ?How Long Ago Did You Use Drugs or Alcohol? No data recorded ?What Did You Use and How Much? Pt reports, drinking 2 gallons of Seagrams Vodka tonight. ? ? ?  Do You Currently Have a Therapist/Psychiatrist? No ? ?Name of Therapist/Psychiatrist: No data recorded ? ?Have You Been Recently Discharged From Any Office Practice or Programs? Yes ? ?Explanation of Discharge From Practice/Program: PT was discharged from the ED 02/24/2022 ? ? ?  ?CCA Screening Triage Referral Assessment ?Type of Contact: Face-to-Face ? ?Telemedicine Service Delivery:   ?Is this Initial or Reassessment? Initial Assessment ? ?Date  Telepsych consult ordered in [facility]:  02/24/22 ? ?Time Telepsych consult ordered in [facility]:  2220 ? ?Location of Assessment: [facility] Assessment [facility] ? ?Provider Location: [facility] Assessment Services ? ? ?Collateral Involvement: Pt denies, having supports. ? ? ?Does Patient Have a Automotive engineer Guardian? No data recorded ?Name and Contact of Legal Guardian: No data recorded ?If Minor and Not Living with Parent(s), Who has Custody? NA ? ?Is CPS involved or ever been involved? Never ? ?Is APS involved or ever been involved? Never ? ? ?Patient Determined To Be At Risk for Harm To Self or Others Based on Review of Patient Reported Information or Presenting Complaint? Yes, for Self-Harm ? ?Method: No data recorded ?Availability of Means: No data recorded ?Intent: No data recorded ?Notification Required: No data recorded ?Additional Information for Danger to Others Potential: No data recorded ?Additional Comments for Danger to Others Potential: No data recorded ?Are There Guns or Other Weapons in Your Home? No data recorded ?Types of Guns/Weapons: No data recorded ?Are These Weapons Safely Secured?                            No data recorded ?Who Could Verify You Are Able To Have These Secured: No data recorded ?Do You Have any Outstanding Charges, Pending Court Dates, Parole/Probation? No data recorded ?Contacted To Inform of Risk of Harm To Self or Others: Unable to Contact: ? ? ? ?Does Patient Present under Involuntary Commitment? No ? ?IVC Papers Initial File Date: No data recorded ? ?[place] of Residence: [place] ? ? ?Patient Currently Receiving the Following Services: Not Receiving Services ? ? ?Determination of Need: Urgent (48 hours) ? ? ?Options For Referral: Inpatient Hospitalization; Facility-Based Crisis; Medication Management; Outpatient Therapy ? ? ? ? ?CCA Biopsychosocial ?Patient Reported Schizophrenia/Schizoaffective Diagnosis in Past: No ? ? ?Strengths: Pt is seeking treatment ? ? ?Mental Health  Symptoms ?Depression:   ?Difficulty Concentrating; Fatigue; Hopelessness; Sleep (too much or little); Irritability; Worthlessness ?  ?Duration of Depressive symptoms:    ?Mania:   ?None ?  ?Anxiety:    ?Worrying; Tension; Restlessness ?  ?Psychosis:   ?Hallucinations ?  ?Duration of Psychotic symptoms:    ?Trauma:   ?None ?  ?Obsessions:   ?None ?  ?Compulsions:   ?None ?  ?Inattention:   ?N/A ?  ?Hyperactivity/Impulsivity:   ?Feeling of restlessness; Fidgets with hands/feet ?  ?Oppositional/Defiant Behaviors:   ?N/A ?  ?Emotional Irregularity:   ?Recurrent suicidal behaviors/gestures/threats ?  ?Other Mood/Personality Symptoms:   ?None ?  ? ?Mental Status Exam ?Appearance and self-care  ?Stature:   ?Average ?  ?Weight:   ?Average weight ?  ?Clothing:   ?Casual ?  ?Grooming:   ?Normal ?  ?Cosmetic use:   ?None ?  ?Posture/gait:   ?Normal ?  ?Motor activity:   ?Not Remarkable ?  ?Sensorium  ?Attention:   ?Normal ?  ?Concentration:   ?-- (Fair.) ?  ?Orientation:   ?X5 ?  ?Recall/memory:   ?Normal ?  ?Affect and Mood  ?Affect:   ?Depressed ?  ?  Mood:   ?Depressed ?  ?Relating  ?Eye contact:   ?-- (Fair.) ?  ?Facial expression:   ?Depressed ?  ?Attitude toward examiner:   ?Cooperative ?  ?Thought and Language  ?Speech flow:  ?Normal ?  ?Thought content:   ?Appropriate to Mood and Circumstances ?  ?Preoccupation:   ?None ?  ?Hallucinations:   ?Auditory ?  ?Organization:  No data recorded  ?Executive Functions  ?Fund of Knowledge:   ?Average ?  ?Intelligence:   ?Average ?  ?Abstraction:   ?Normal ?  ?Judgement:   ?Impaired ?  ?Reality Testing:   ?Variable ?  ?Insight:   ?Poor ?  ?Decision Making:   ?Impulsive ?  ?Social Functioning  ?Social Maturity:   ?Impulsive ?  ?Social Judgement:   ?"Street Smart" ?  ?Stress  ?Stressors:   ?Illness; Housing ?  ?Coping Ability:   ?Exhausted; Overwhelmed ?  ?Skill Deficits:   ?Self-control ?  ?Supports:   ?Support needed ?  ? ? ?Religion: ?Religion/Spirituality ?Are You A Religious  Person?: Yes ?What is Your Religious Affiliation?:  (Pt reports, "I love God.") ?How Might This Affect Treatment?: NA ? ?Leisure/Recreation: ?Leisure / Recreation ?Do You Have Hobbies?: No (Pt reports, he used to li

## 2022-02-27 NOTE — Progress Notes (Signed)
?   02/27/22 0300  ?Patient Reported Information  ?How Did You Hear About [place]? Legal System  ?What Is the Reason for Your Visit/Call Today? Pt reports, he was on [facility] for alcohol use but decided to leave. Pt reports, while he was there he attended AA and related to the life experiences from presenters. Pt reports, he wanted to kill himself tonight. Pt reports, he has a couple of plans, he can kill himself in 1000 ways. Pt did not disclose a plan during the assessment. Pt reports, hearing a voice saying, "just end it." Pt reports, he heard voices one other time. Pt reports, he can not contract for safety if discharged. Pt reports, drinking 2 gallons of Seagrams Vodka tonight. Pt denies, HI, self-injurious behaviors.  ?How Long Has This Been Causing You Problems? 1-6 months  ?What Do You Feel Would Help You the Most Today? Alcohol or Drug Use Treatment;Treatment for Depression or other mood problem;Housing Assistance  ?Have You Recently Had Any Thoughts About Hurting Yourself? Yes  ?Are You Planning to Commit Suicide/Harm Yourself At This time?  ?(Pt did not disclose a plan.)  ?Have you Recently Had Thoughts About [place]? No  ?Are You Planning To Harm Someone At This Time? No  ?What Did You Use and How Much? Pt reports, drinking 2 gallons of Seagrams Vodka tonight.  ?Do You Currently Have a Therapist/Psychiatrist? No  ?CCA Screening Triage Referral Assessment  ?Type of Contact Face-to-Face  ?Is this Initial or Reassessment? Initial Assessment  ?Location of Assessment [facility]  ?Provider location [facility] Assessment Services  ?Collateral Involvement Pt denies, having supports.  ?Patient Determined To Be At Risk for Harm To Self or Others Based on Review of Patient Reported Information or Presenting Complaint? Yes, for Self-Harm  ?Does Patient Present under Involuntary Commitment? No  ?[place]  ?Patient Currently Receiving the Following Services: Not Receiving Services   ?Determination of Need Urgent (48 hours)  ?Options For Referral Inpatient Hospitalization;Facility-Based Crisis;Medication Management;Outpatient Therapy  ? ? ?Determination of need: Urgent. ? ? ?Vertell Novak, MS, [facility] ?Triage Specialist ?[phone] ? ?

## 2022-02-27 NOTE — ED Provider Notes (Signed)
[facility] Continuous Assessment Admission H&P ? ?Date: 02/27/22 ?Patient Name: William Powers ?MRN: [id number] ?Chief Complaint: No chief complaint on file. ?   ? ?Diagnoses:  ?Final diagnoses:  ?Suicidal ideation  ?Substance abuse (HCC)  ?Homelessness  ? ? ?HPI: William Powers, 41 y.o male,  present to [facility] by [employer], for suicidal ideation,  pt was see yesterday at [facility], but decided to leave,  according to pt he wanted to kill himself tonight,  report he has couple of plan, he can kill himself in 1000 ways,  but did not give any plans.   Pt seen to be impaired or under the influence.   ? ?Observation of patient,  he Is alert and oriented x4, speech clear,  mood anxious affect congruent with mood,  pt seen to be very manipulative and is telling us what we want to hear.   Pt seen to be homeless and is looking a place to stay.   Pt report he drank 2 gallons of vodka tonight,  he report he is suicidal but didn't give a plan.  Pt denies HI, AVH. According to patient I just want to get clean right now.  ? ? ?Recommend inpatient observation  ? ?PHQ 2-9:   ?Flowsheet Row ED from 02/25/2022 in [facility] ED from 02/24/2022 in [facility] from 02/23/2022 in [facility]  ?C-SSRS RISK CATEGORY No Risk High Risk Low Risk  ? ?  ?  ? ?Total Time spent with patient: 20 minutes ? ?Musculoskeletal  ?Strength & Muscle Tone: within normal limits ?Gait & Station: normal ?Patient leans: N/A ? ?Psychiatric Specialty Exam  ?Presentation ?General Appearance: Casual ? ?Eye Contact:Fair ? ?Speech:Clear and Coherent ? ?Speech Volume:Normal ? ?Handedness:Ambidextrous ? ? ?Mood and Affect  ?Mood:Anxious ? ?Affect:Flat ? ? ?Thought Process  ?Thought Processes:Coherent ? ?Descriptions of Associations:Circumstantial ? ?Orientation:Full (Time, Place and Person) ? ?Thought Content:Illogical ? Diagnosis of Schizophrenia or Schizoaffective disorder in  past: No ?  ?Hallucinations:Hallucinations: None ? ?Ideas of Reference:None ? ?Suicidal Thoughts:Suicidal Thoughts: Yes, Active ?SI Active Intent and/or Plan: With Plan ? ?Homicidal Thoughts:Homicidal Thoughts: No ? ? ?Sensorium  ?Memory:Immediate Fair ? ?Judgment:Poor ? ?Insight:Fair ? ? ?Executive Functions  ?Concentration:Fair ? ?Attention Span:Fair ? ?Recall:Fair ? ?Fund of Knowledge:Fair ? ?Language:Fair ? ? ?Psychomotor Activity  ?Psychomotor Activity:Psychomotor Activity: Normal ? ? ?Assets  ?Assets:Housing; Desire for Improvement ? ? ?Sleep  ?Sleep:Sleep: Fair ? ? ?Nutritional Assessment (For OBS and FBC admissions only) ?Has the patient had a weight loss or gain of 10 pounds or more in the last 3 months?: No ?Has the patient had a decrease in food intake/or appetite?: No ?Does the patient have dental problems?: No ?Does the patient have eating habits or behaviors that may be indicators of an eating disorder including binging or inducing vomiting?: No ?Has the patient recently lost weight without trying?: 0 ?Has the patient been eating poorly because of a decreased appetite?: 0 ?Malnutrition Screening Tool Score: 0 ? ? ? ?Physical Exam ?HENT:  ?   Head: Normocephalic.  ?   Nose: Nose normal.  ?Cardiovascular:  ?   Rate and Rhythm: Normal rate.  ?Pulmonary:  ?   Effort: Pulmonary effort is normal.  ?Abdominal:  ?   General: Abdomen is flat.  ?Musculoskeletal:     ?   General: Normal range of motion.  ?   Cervical back: Normal range of motion.  ?Skin: ?   General: Skin is  warm.  ?Neurological:  ?   General: No focal deficit present.  ?   Mental Status: He is alert.  ?Psychiatric:     ?   Mood and Affect: Mood normal.     ?   Behavior: Behavior normal.     ?   Thought Content: Thought content normal.     ?   Judgment: Judgment normal.  ? ?Review of Systems  ?Constitutional: Negative.   ?HENT: Negative.    ?Eyes: Negative.   ?Respiratory: Negative.    ?Cardiovascular: Negative.   ?Gastrointestinal: Negative.    ?Genitourinary: Negative.   ?Musculoskeletal: Negative.   ?Skin: Negative.   ?Neurological: Negative.   ?Endo/Heme/Allergies: Negative.   ?Psychiatric/Behavioral:  Positive for depression, substance abuse and suicidal ideas.   ? ?Blood pressure 120/83, pulse 92, temperature 98.1 ?F (36.7 ?C), temperature source Oral, resp. rate 20, SpO2 98 %. There is no height or weight on file to calculate BMI. ? ?Past Psychiatric History: substance abuse,  ETOH  ? ?Is the patient at risk to self? Yes  ?Has the patient been a risk to self in the past 6 months? Yes .    ?Has the patient been a risk to self within the distant past? Yes   ?Is the patient a risk to others? No   ?Has the patient been a risk to others in the past 6 months? No   ?Has the patient been a risk to others within the distant past? No  ? ?Past Medical History:  ?Past Medical History:  ?Diagnosis Date  ? Heart failure (HCC)   ? HFrEF (heart failure with reduced ejection fraction) (HCC)   ? 15% EF as of 2019  ? HIV (human immunodeficiency virus infection) (HCC)   ? Hypertension   ? Polysubstance abuse (HCC)   ? Pulmonary embolism (HCC)   ?  ?Past Surgical History:  ?Procedure Laterality Date  ? HERNIA REPAIR    ? ? ?Family History:  ?Family History  ?Problem Relation Age of Onset  ? Lupus Mother   ? HIV Mother   ? HIV Father   ? ? ?Social History:  ?Social History  ? ?Socioeconomic History  ? Marital status: Divorced  ?  Spouse name: Not on file  ? Number of children: Not on file  ? Years of education: Not on file  ? Highest education level: Not on file  ?Occupational History  ? Not on file  ?Tobacco Use  ? Smoking status: Some Days  ?  Types: Cigarettes  ? Smokeless tobacco: Never  ?Vaping Use  ? Vaping Use: Never used  ?Substance and Sexual Activity  ? Alcohol use: Yes  ? Drug use: Yes  ?  Types: Marijuana  ? Sexual activity: Not on file  ?Other Topics Concern  ? Not on file  ?Social History Narrative  ? Not on file  ? ?Social Determinants of Health   ? ?Financial Resource Strain: Not on file  ?Food Insecurity: Not on file  ?Transportation Needs: Not on file  ?Physical Activity: Not on file  ?Stress: Not on file  ?Social Connections: Not on file  ?Intimate Partner Violence: Not on file  ? ? ?SDOH:  ?SDOH Screenings  ? ?Alcohol Screen: Not on file  ?Depression (PHQ2-9): Low Risk   ? PHQ-2 Score: 0  ?Financial Resource Strain: Not on file  ?Food Insecurity: Not on file  ?Housing: Not on file  ?Physical Activity: Not on file  ?Social Connections: Not on file  ?Stress: Not on  file  ?Tobacco Use: High Risk  ? Smoking Tobacco Use: Some Days  ? Smokeless Tobacco Use: Never  ? Passive Exposure: Not on file  ?Transportation Needs: Not on file  ? ? ?Last Labs:  ?Admission on 02/27/2022  ?Component Date Value Ref Range Status  ? POC Amphetamine UR 02/27/2022 None Detected  NONE DETECTED (Cut Off Level 1000 ng/mL) Final  ? POC Secobarbital (BAR) 02/27/2022 None Detected  NONE DETECTED (Cut Off Level 300 ng/mL) Final  ? POC Buprenorphine (BUP) 02/27/2022 None Detected  NONE DETECTED (Cut Off Level 10 ng/mL) Final  ? POC Oxazepam (BZO) 02/27/2022 None Detected  NONE DETECTED (Cut Off Level 300 ng/mL) Final  ? POC Cocaine UR 02/27/2022 None Detected  NONE DETECTED (Cut Off Level 300 ng/mL) Final  ? POC Methamphetamine UR 02/27/2022 None Detected  NONE DETECTED (Cut Off Level 1000 ng/mL) Final  ? POC Morphine 02/27/2022 None Detected  NONE DETECTED (Cut Off Level 300 ng/mL) Final  ? POC Oxycodone UR 02/27/2022 None Detected  NONE DETECTED (Cut Off Level 100 ng/mL) Final  ? POC Methadone UR 02/27/2022 None Detected  NONE DETECTED (Cut Off Level 300 ng/mL) Final  ? POC Marijuana UR 02/27/2022 Positive (A)  NONE DETECTED (Cut Off Level 50 ng/mL) Final  ? SARSCOV2ONAVIRUS 2 AG 02/27/2022 NEGATIVE  NEGATIVE Final  ? Comment: (NOTE) ?SARS-CoV-2 antigen NOT DETECTED.  ? ?Negative results are presumptive.  Negative results do not preclude ?SARS-CoV-2 infection and should not be used as  the sole basis for ?treatment or other patient management decisions, including infection  ?control decisions, particularly in the presence of clinical signs and  ?symptoms consistent with COVID-19, or in those who h

## 2022-03-01 ENCOUNTER — Encounter (HOSPITAL_COMMUNITY): Payer: Self-pay

## 2022-03-01 ENCOUNTER — Other Ambulatory Visit: Payer: Self-pay

## 2022-03-01 ENCOUNTER — Emergency Department (HOSPITAL_COMMUNITY)
Admission: EM | Admit: 2022-03-01 | Discharge: 2022-03-01 | Disposition: A | Discharge status: Home / Self Care | Payer: Medicaid Other | Attending: Emergency Medicine | Admitting: Emergency Medicine

## 2022-03-01 DIAGNOSIS — I10 Essential (primary) hypertension: Secondary | ICD-10-CM | POA: Diagnosis not present

## 2022-03-01 DIAGNOSIS — Z21 Asymptomatic human immunodeficiency virus [HIV] infection status: Secondary | ICD-10-CM | POA: Insufficient documentation

## 2022-03-01 DIAGNOSIS — F1010 Alcohol abuse, uncomplicated: Secondary | ICD-10-CM | POA: Diagnosis not present

## 2022-03-01 DIAGNOSIS — R45851 Suicidal ideations: Secondary | ICD-10-CM | POA: Diagnosis not present

## 2022-03-01 DIAGNOSIS — F10129 Alcohol abuse with intoxication, unspecified: Secondary | ICD-10-CM | POA: Diagnosis not present

## 2022-03-01 DIAGNOSIS — Y906 Blood alcohol level of 120-199 mg/100 ml: Secondary | ICD-10-CM | POA: Insufficient documentation

## 2022-03-01 DIAGNOSIS — Z046 Encounter for general psychiatric examination, requested by authority: Secondary | ICD-10-CM | POA: Diagnosis present

## 2022-03-01 LAB — COMPREHENSIVE METABOLIC PANEL
ALT: 36 U/L (ref 0–44)
AST: 42 U/L — ABNORMAL HIGH (ref 15–41)
Albumin: 3.9 g/dL (ref 3.5–5.0)
Alkaline Phosphatase: 59 U/L (ref 38–126)
Anion gap: 12 (ref 5–15)
BUN: 19 mg/dL (ref 6–20)
CO2: 21 mmol/L — ABNORMAL LOW (ref 22–32)
Calcium: 8.9 mg/dL (ref 8.9–10.3)
Chloride: 106 mmol/L (ref 98–111)
Creatinine, Ser: 1.01 mg/dL (ref 0.61–1.24)
GFR, Estimated: >60 mL/min (ref >60)
Glucose, Bld: 83 mg/dL (ref 70–99)
Potassium: 3.9 mmol/L (ref 3.5–5.1)
Sodium: 139 mmol/L (ref 135–145)
Total Bilirubin: 0.9 mg/dL (ref 0.3–1.2)
Total Protein: 8.7 g/dL — ABNORMAL HIGH (ref 6.5–8.1)

## 2022-03-01 LAB — CBC
HCT: 42.3 % (ref 39.0–52.0)
Hemoglobin: 14.2 g/dL (ref 13.0–17.0)
MCH: 31.8 pg (ref 26.0–34.0)
MCHC: 33.6 g/dL (ref 30.0–36.0)
MCV: 94.8 fL (ref 80.0–100.0)
Platelets: 311 10*3/uL (ref 150–400)
RBC: 4.46 MIL/uL (ref 4.22–5.81)
RDW: 15.9 % — ABNORMAL HIGH (ref 11.5–15.5)
WBC: 5.3 10*3/uL (ref 4.0–10.5)
nRBC: 0 % (ref 0.0–0.2)

## 2022-03-01 LAB — ETHANOL: Alcohol, Ethyl (B): 105 mg/dL — ABNORMAL HIGH (ref <10)

## 2022-03-01 LAB — RAPID URINE DRUG SCREEN, HOSP PERFORMED
Amphetamines: NOT DETECTED
Barbiturates: NOT DETECTED
Benzodiazepines: NOT DETECTED
Cocaine: NOT DETECTED
Opiates: NOT DETECTED
Tetrahydrocannabinol: POSITIVE — AB

## 2022-03-01 LAB — ACETAMINOPHEN LEVEL: Acetaminophen (Tylenol), Serum: <10 ug/mL — ABNORMAL LOW (ref 10–30)

## 2022-03-01 LAB — SALICYLATE LEVEL: Salicylate Lvl: <7 mg/dL — ABNORMAL LOW (ref 7.0–30.0)

## 2022-03-01 MED ORDER — THIAMINE HCL 100 MG PO TABS
100.0000 mg | ORAL_TABLET | Freq: Every day | ORAL | Status: DC
  Administered 2022-03-01: 100 mg via ORAL
  Filled 2022-03-01: qty 1

## 2022-03-01 MED ORDER — LORAZEPAM 1 MG PO TABS
0.0000 mg | ORAL_TABLET | Freq: Four times a day (QID) | ORAL | Status: DC
  Administered 2022-03-01: 1 mg via ORAL
  Filled 2022-03-01: qty 1

## 2022-03-01 MED ORDER — LORAZEPAM 2 MG/ML IJ SOLN: 0.0000 mg | Freq: Two times a day (BID) | INTRAMUSCULAR | Status: DC

## 2022-03-01 MED ORDER — LORAZEPAM 2 MG/ML IJ SOLN: 0.0000 mg | Freq: Four times a day (QID) | INTRAMUSCULAR | Status: DC

## 2022-03-01 MED ORDER — LORAZEPAM 1 MG PO TABS: 0.0000 mg | ORAL_TABLET | Freq: Two times a day (BID) | ORAL | Status: DC

## 2022-03-01 MED ORDER — THIAMINE HCL 100 MG/ML IJ SOLN: 100.0000 mg | Freq: Every day | INTRAMUSCULAR | Status: DC

## 2022-03-01 NOTE — Discharge Instructions (Addendum)
Turn for any new or worse symptoms.  Return for any development of suicidal thoughts. ?

## 2022-03-01 NOTE — ED Triage Notes (Signed)
Pt states that "he is a piece of shit" and he does not like himself. Pt reports drinking 2 cups of clorox and states that it tasted like tea.  ?

## 2022-03-01 NOTE — ED Notes (Signed)
Pt sleeping no signs of distress

## 2022-03-01 NOTE — ED Notes (Signed)
Pt wanded by security. 

## 2022-03-01 NOTE — ED Provider Notes (Signed)
?[facility]-EMERGENCY DEPT ?Provider Note ? ? ?CSN: [id number] ?Arrival date & time: 03/01/22  0011 ? ?  ? ?History ? ?Chief Complaint  ?Patient presents with  ? Alcohol Intoxication  ? Suicidal  ? ? ?William Powers is a 41 y.o. male. ? ?Patient presenting with a complaint of suicidal ideation states that he drank bleach in a suicide attempt at 10 PM last night.  Vomited once.  Patient states he drank about 8 ounces of bleach.  Nursing spoke with [facility] control who stated with a normal acetaminophen level and otherwise unremarkable lab work patient would be cleared after tolerating p.o. challenge.  Patient is asking for food now. ? ?Patient seen frequently for alcohol abuse and suicidal ideation.  Patient was seen May 8 ninth and on May 11 was just released from the behavioral health urgent care.  Based on the records patient is homeless. ? ?Past medical history significant for hypertension HIV polysubstance abuse history of heart failure. ? ? ?  ? ?Home Medications ?Prior to Admission medications   ?Medication Sig Start Date End Date Taking? Authorizing Provider  ?BIKTARVY 50-200-25 MG TABS tablet Take 1 tablet by mouth daily. 11/18/21   Melene PlanFloyd, Dan, DO  ?metoprolol succinate (TOPROL-XL) 50 MG 24 hr tablet Take 1 tablet by mouth 2 times daily. 11/18/21   Melene PlanFloyd, Dan, DO  ?Potassium Chloride ER 20 MEQ TBCR Take 20 mEq by mouth daily at 6 (six) AM. ?Patient not taking: No sig reported 08/07/21 09/05/21  Bobette Mortiz, David Manuel, MD  ?   ? ?Allergies    ?Bee venom, Lisinopril, and Sulfamethoxazole-trimethoprim   ? ?Review of Systems   ?Review of Systems  ?Constitutional:  Negative for chills and fever.  ?HENT:  Negative for ear pain and sore throat.   ?Eyes:  Negative for pain and visual disturbance.  ?Respiratory:  Negative for cough and shortness of breath.   ?Cardiovascular:  Negative for chest pain and palpitations.  ?Gastrointestinal:  Positive for vomiting. Negative for abdominal  pain, blood in stool and diarrhea.  ?Genitourinary:  Negative for dysuria and hematuria.  ?Musculoskeletal:  Negative for arthralgias and back pain.  ?Skin:  Negative for color change and rash.  ?Neurological:  Negative for seizures and syncope.  ?All other systems reviewed and are negative. ? ?Physical Exam ?Updated Vital Signs ?BP (!) 103/55 (BP Location: Left Arm)   Pulse 84   Temp 98.7 ?F (37.1 ?C) (Oral)   Resp 18   SpO2 96%  ?Physical Exam ?Vitals and nursing note reviewed.  ?Constitutional:   ?   General: He is not in acute distress. ?   Appearance: Normal appearance. He is well-developed.  ?HENT:  ?   Head: Normocephalic and atraumatic.  ?   Mouth/Throat:  ?   Pharynx: Oropharynx is clear.  ?Eyes:  ?   Extraocular Movements: Extraocular movements intact.  ?   Conjunctiva/sclera: Conjunctivae normal.  ?   Pupils: Pupils are equal, round, and reactive to light.  ?Cardiovascular:  ?   Rate and Rhythm: Normal rate and regular rhythm.  ?   Heart sounds: No murmur heard. ?Pulmonary:  ?   Effort: Pulmonary effort is normal. No respiratory distress.  ?   Breath sounds: Normal breath sounds.  ?Abdominal:  ?   General: There is no distension.  ?   Palpations: Abdomen is soft.  ?   Tenderness: There is no abdominal tenderness.  ?Musculoskeletal:     ?   General: No swelling.  ?  Cervical back: Normal range of motion and neck supple.  ?Skin: ?   General: Skin is warm and dry.  ?   Capillary Refill: Capillary refill takes less than 2 seconds.  ?Neurological:  ?   General: No focal deficit present.  ?   Mental Status: He is alert and oriented to person, place, and time.  ?   Cranial Nerves: No cranial nerve deficit.  ?   Sensory: No sensory deficit.  ?Psychiatric:     ?   Mood and Affect: Mood normal.  ? ? ?ED Results / Procedures / Treatments   ?Labs ?(all labs ordered are listed, but only abnormal results are displayed) ?Labs Reviewed  ?COMPREHENSIVE METABOLIC PANEL - Abnormal; Notable for the following components:   ?    Result Value  ? CO2 21 (*)   ? Total Protein 8.7 (*)   ? AST 42 (*)   ? All other components within normal limits  ?ETHANOL - Abnormal; Notable for the following components:  ? Alcohol, Ethyl (B) 105 (*)   ? All other components within normal limits  ?SALICYLATE LEVEL - Abnormal; Notable for the following components:  ? Salicylate Lvl <7.0 (*)   ? All other components within normal limits  ?ACETAMINOPHEN LEVEL - Abnormal; Notable for the following components:  ? Acetaminophen (Tylenol), Serum <10 (*)   ? All other components within normal limits  ?CBC - Abnormal; Notable for the following components:  ? RDW 15.9 (*)   ? All other components within normal limits  ?RAPID URINE DRUG SCREEN, HOSP PERFORMED - Abnormal; Notable for the following components:  ? Tetrahydrocannabinol POSITIVE (*)   ? All other components within normal limits  ? ? ?EKG ?EKG Interpretation ? ?Date/Time:  Saturday Mar 01 2022 07:28:06 EDT ?Ventricular Rate:  92 ?PR Interval:  153 ?QRS Duration: 92 ?QT Interval:  353 ?QTC Calculation: 437 ?R Axis:   65 ?Text Interpretation: Sinus rhythm RSR' in V1 or V2, right VCD or RVH Minimal ST elevation, anterior leads No significant change since last tracing Confirmed by Vanetta Mulders [phone]) on 03/01/2022 7:48:38 AM ? ?Radiology ?No results found. ? ?Procedures ?Procedures  ? ? ?Medications Ordered in ED ?Medications - No data to display ? ?ED Course/ Medical Decision Making/ A&P ?  ?                        ?Medical Decision Making ?Amount and/or Complexity of Data Reviewed ?Labs: ordered. ? ?Risk ?OTC drugs. ?Prescription drug management. ? ? ?Patient stated the ingestion was at 10 PM last night.  With the bleach.  Information provided by poison control documented in the HPI.  We will go ahead and give fluid challenge orally now.  Then patient can have food.  He is requesting food. ? ?We will have TTS reevaluate for the complaint of suicidal ideation and the suicide attempt. ? ?Patient not  medically cleared yet. ? ?Complete metabolic panel is normal other than AST of 42 bilirubin is normal renal functions normal alcohol level is 105 salicylate levels less than 7 Tylenol level less than 10 CBC no leukocytosis hemoglobin good if at 14.2.  Urine drug screen negative.  EKG without acute changes. ? ?Based on this patient can be medically cleared.  We will put in the consult for TTS. ? ?Based on patient's frequent visits for the same complaints and then he tends to leave and change his mind about suicidal ideation will not IVC him at this  time. ? ?Patient wanting to go home.  Patient medically cleared from the ingestion of bleach if he drank any at all.  Because there has been no diarrhea.  Patient's ate 2 meals here.  Patient no longer states that he is suicidal.  He states he will return if he gets suicidal again.  Patient stable for discharge home ? ? ?Final Clinical Impression(s) / ED Diagnoses ?Final diagnoses:  ?Suicidal ideation  ?Alcohol abuse  ? ? ?Rx / DC Orders ?ED Discharge Orders   ? ? None  ? ?  ? ? ?  ?Vanetta Mulders, MD ?03/01/22 1324 ? ?

## 2022-03-01 NOTE — ED Notes (Signed)
Patient's belongings (x2 belongings bags) moved from triage to 9-12 cabinet. ?

## 2022-03-01 NOTE — ED Notes (Signed)
Pt alert, able to follow commands. Endorses drinking approximately 8 ounces of bleach last night. Charge nurse notified of need for room for treatment.  ?

## 2022-03-01 NOTE — ED Notes (Signed)
Pt refused discharge vital signs

## 2022-03-01 NOTE — ED Notes (Signed)
Pt's belonging have placed in two bags. Pt had pair of shorts, shirt, a pair of shoes, phone and wallet. ?

## 2022-03-01 NOTE — ED Notes (Signed)
Spoke with Magda Paganini at [facility] pertaining to ingestion, who stated that with a normal acetaminophen level and otherwise unremarkable lab work pt would be cleared after tolerating PO challenge.  ?

## 2022-03-01 NOTE — ED Notes (Signed)
Pt's belonging are behind Triage nurse station. ?

## 2022-03-02 ENCOUNTER — Encounter (HOSPITAL_COMMUNITY): Payer: Self-pay

## 2022-03-02 ENCOUNTER — Other Ambulatory Visit: Payer: Self-pay

## 2022-03-02 ENCOUNTER — Emergency Department (HOSPITAL_COMMUNITY)
Admission: EM | Admit: 2022-03-02 | Discharge: 2022-03-03 | Disposition: A | Discharge status: Home / Self Care | Payer: Medicaid Other | Attending: Emergency Medicine | Admitting: Emergency Medicine

## 2022-03-02 DIAGNOSIS — R45851 Suicidal ideations: Secondary | ICD-10-CM | POA: Diagnosis not present

## 2022-03-02 DIAGNOSIS — Z21 Asymptomatic human immunodeficiency virus [HIV] infection status: Secondary | ICD-10-CM | POA: Diagnosis not present

## 2022-03-02 DIAGNOSIS — I110 Hypertensive heart disease with heart failure: Secondary | ICD-10-CM | POA: Insufficient documentation

## 2022-03-02 DIAGNOSIS — Z046 Encounter for general psychiatric examination, requested by authority: Secondary | ICD-10-CM | POA: Diagnosis present

## 2022-03-02 DIAGNOSIS — I509 Heart failure, unspecified: Secondary | ICD-10-CM | POA: Insufficient documentation

## 2022-03-02 NOTE — ED Provider Triage Note (Signed)
Emergency Medicine Provider Triage Evaluation Note ? ?[address] [place] , a 41 y.o. male  was evaluated in triage.  Pt complains of suicidal.  He states that he has ruined his life with alcohol.  He states that he recently tried to drink bleach to kill himself.  He thought about hanging him self but felt he was too tall ? ? ?Physical Exam  ?BP (!) 132/96 (BP Location: Left Arm)   Pulse (!) 101   Temp 98.1 ?F (36.7 ?C) (Oral)   Resp 18   Ht 6\' 4"  (1.93 m)   Wt 108.4 kg   SpO2 99%   BMI 29.09 kg/m?  ?Gen:   Awake, no distress   ?Resp:  Normal effort  ?MSK:   Moves extremities without difficulty  ?Other:  Normal speech.  SI with out specific plan ? ?Medical Decision Making  ?Medically screening exam initiated at 11:22 PM.  Appropriate orders placed.  Ahr was informed that the remainder of the evaluation will be completed by another provider, this initial triage assessment does not replace that evaluation, and the importance of remaining in the ED until their evaluation is complete. ? ? ?  ?Harrison Mons, PA-C ?03/02/22 2324 ? ?

## 2022-03-02 NOTE — ED Triage Notes (Signed)
Patient arrives by bus c/o wanting to hurt himself. Pt states he drank "some bleach" 2 days ago and went to the hospital "and they pumped it out."  ?

## 2022-03-03 LAB — COMPREHENSIVE METABOLIC PANEL
ALT: 38 U/L (ref 0–44)
AST: 41 U/L (ref 15–41)
Albumin: 3.8 g/dL (ref 3.5–5.0)
Alkaline Phosphatase: 61 U/L (ref 38–126)
Anion gap: 12 (ref 5–15)
BUN: 10 mg/dL (ref 6–20)
CO2: 20 mmol/L — ABNORMAL LOW (ref 22–32)
Calcium: 9.1 mg/dL (ref 8.9–10.3)
Chloride: 105 mmol/L (ref 98–111)
Creatinine, Ser: 0.94 mg/dL (ref 0.61–1.24)
GFR, Estimated: >60 mL/min (ref >60)
Glucose, Bld: 87 mg/dL (ref 70–99)
Potassium: 3.3 mmol/L — ABNORMAL LOW (ref 3.5–5.1)
Sodium: 137 mmol/L (ref 135–145)
Total Bilirubin: 1.1 mg/dL (ref 0.3–1.2)
Total Protein: 8.5 g/dL — ABNORMAL HIGH (ref 6.5–8.1)

## 2022-03-03 LAB — CBC WITH DIFFERENTIAL/PLATELET
Abs Immature Granulocytes: 0.01 10*3/uL (ref 0.00–0.07)
Basophils Absolute: 0 10*3/uL (ref 0.0–0.1)
Basophils Relative: 1 %
Eosinophils Absolute: 0.4 10*3/uL (ref 0.0–0.5)
Eosinophils Relative: 7 %
HCT: 42.8 % (ref 39.0–52.0)
Hemoglobin: 14.4 g/dL (ref 13.0–17.0)
Immature Granulocytes: 0 %
Lymphocytes Relative: 41 %
Lymphs Abs: 1.9 10*3/uL (ref 0.7–4.0)
MCH: 32.2 pg (ref 26.0–34.0)
MCHC: 33.6 g/dL (ref 30.0–36.0)
MCV: 95.7 fL (ref 80.0–100.0)
Monocytes Absolute: 0.6 10*3/uL (ref 0.1–1.0)
Monocytes Relative: 12 %
Neutro Abs: 1.9 10*3/uL (ref 1.7–7.7)
Neutrophils Relative %: 39 %
Platelets: 310 10*3/uL (ref 150–400)
RBC: 4.47 MIL/uL (ref 4.22–5.81)
RDW: 15.8 % — ABNORMAL HIGH (ref 11.5–15.5)
WBC: 4.9 10*3/uL (ref 4.0–10.5)
nRBC: 0 % (ref 0.0–0.2)

## 2022-03-03 LAB — SALICYLATE LEVEL: Salicylate Lvl: <7 mg/dL — ABNORMAL LOW (ref 7.0–30.0)

## 2022-03-03 LAB — ACETAMINOPHEN LEVEL: Acetaminophen (Tylenol), Serum: <10 ug/mL — ABNORMAL LOW (ref 10–30)

## 2022-03-03 LAB — ETHANOL: Alcohol, Ethyl (B): 90 mg/dL — ABNORMAL HIGH (ref <10)

## 2022-03-03 NOTE — ED Notes (Signed)
Ginger ale, sandwich bag, and warm blanket provided to pt. Pt very calm and cooperative at this time ?

## 2022-03-03 NOTE — ED Notes (Signed)
Pt's valuables placed in security ?
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
## 2022-03-03 NOTE — ED Notes (Signed)
Pt's belongings inventoried and placed in [place] large locker #1.  ?

## 2022-03-04 ENCOUNTER — Other Ambulatory Visit: Payer: Self-pay

## 2022-03-04 ENCOUNTER — Emergency Department (HOSPITAL_COMMUNITY)
Admission: EM | Admit: 2022-03-04 | Discharge: 2022-03-04 | Disposition: A | Discharge status: Home / Self Care | Payer: Medicaid Other | Attending: Emergency Medicine | Admitting: Emergency Medicine

## 2022-03-04 DIAGNOSIS — F17210 Nicotine dependence, cigarettes, uncomplicated: Secondary | ICD-10-CM | POA: Diagnosis not present

## 2022-03-04 DIAGNOSIS — Z21 Asymptomatic human immunodeficiency virus [HIV] infection status: Secondary | ICD-10-CM | POA: Insufficient documentation

## 2022-03-04 DIAGNOSIS — I110 Hypertensive heart disease with heart failure: Secondary | ICD-10-CM | POA: Insufficient documentation

## 2022-03-04 DIAGNOSIS — I509 Heart failure, unspecified: Secondary | ICD-10-CM | POA: Diagnosis not present

## 2022-03-04 DIAGNOSIS — R0981 Nasal congestion: Secondary | ICD-10-CM | POA: Insufficient documentation

## 2022-03-04 MED ORDER — FLUTICASONE PROPIONATE 50 MCG/ACT NA SUSP: 1.0000 | Freq: Every day | NASAL | 0 refills | Status: DC

## 2022-03-04 NOTE — Discharge Instructions (Addendum)
You were evaluated in the Emergency Department and after careful evaluation, we did not find any emergent condition requiring admission or further testing in the hospital. ? ?Your exam/testing today was overall reassuring.  Use the nasal spray as needed. ? ?Please return to the Emergency Department if you experience any worsening of your condition.  Thank you for allowing [place] to be a part of your care. ? ?

## 2022-03-04 NOTE — ED Triage Notes (Addendum)
Patient arrived by EMS for nasal congestion. Patient states he is out of his medication. Patient states he always has thoughts of hurting hisself, so every time we ask it will always be the same. Patient states he is here because he feels sick.  ?

## 2022-03-04 NOTE — ED Provider Notes (Signed)
?[facility]-EMERGENCY DEPT ?[facility] Emergency Department ?Provider Note ?MRN:  [id number]  ?Arrival date & time: 03/04/22    ? ?Chief Complaint   ?Nasal Congestion ?  ?History of Present Illness   ?William Powers is a 41 y.o. year-old male with a history of heart failure, drug use disorder, HIV presenting to the ED with chief complaint of congestion. ? ?Nasal congestion for the past 6 hours, no other complaints, no chest pain or shortness of breath, no suicidal ideation.  Frequent ED visitor. ? ?Review of Systems  ?A thorough review of systems was obtained and all systems are negative except as noted in the HPI and PMH.  ? ?Patient's Health History   ? ?Past Medical History:  ?Diagnosis Date  ? Heart failure (HCC)   ? HFrEF (heart failure with reduced ejection fraction) (HCC)   ? 15% EF as of 2019  ? HIV (human immunodeficiency virus infection) (HCC)   ? Hypertension   ? Polysubstance abuse (HCC)   ? Pulmonary embolism (HCC)   ?  ?Past Surgical History:  ?Procedure Laterality Date  ? HERNIA REPAIR    ?  ?Family History  ?Problem Relation Age of Onset  ? Lupus Mother   ? HIV Mother   ? HIV Father   ?  ?Social History  ? ?Socioeconomic History  ? Marital status: Divorced  ?  Spouse name: Not on file  ? Number of children: Not on file  ? Years of education: Not on file  ? Highest education level: Not on file  ?Occupational History  ? Not on file  ?Tobacco Use  ? Smoking status: Some Days  ?  Packs/day: 0.50  ?  Types: Cigarettes, Cigars  ? Smokeless tobacco: Never  ?Vaping Use  ? Vaping Use: Never used  ?Substance and Sexual Activity  ? Alcohol use: Yes  ?  Comment: 10 pints per week  ? Drug use: Yes  ?  Types: Marijuana, Cocaine  ? Sexual activity: Not on file  ?Other Topics Concern  ? Not on file  ?Social History Narrative  ? Not on file  ? ?Social Determinants of Health  ? ?Financial Resource Strain: Not on file  ?Food Insecurity: Not on file  ?Transportation Needs: Not on file  ?Physical Activity: Not on  file  ?Stress: Not on file  ?Social Connections: Not on file  ?Intimate Partner Violence: Not on file  ?  ? ?Physical Exam  ? ?Vitals:  ? 03/04/22 0112 03/04/22 0120  ?BP:  128/89  ?Pulse:  97  ?Resp:  16  ?Temp:  98.4 ?F (36.9 ?C)  ?SpO2: 100% 100%  ?  ?CONSTITUTIONAL: Well-appearing, NAD ?NEURO/PSYCH:  Alert and oriented x 3, no focal deficits ?EYES:  eyes equal and reactive ?ENT/NECK:  no LAD, no JVD ?CARDIO: Regular rate, well-perfused, normal S1 and S2 ?PULM:  CTAB no wheezing or rhonchi ?GI/GU:  non-distended, non-tender ?MSK/SPINE:  No gross deformities, no edema ?SKIN:  no rash, atraumatic ? ? ?*Additional and/or pertinent findings included in MDM below ? ?Diagnostic and Interventional Summary  ? ? EKG Interpretation ? ?Date/Time:    ?Ventricular Rate:    ?PR Interval:    ?QRS Duration:   ?QT Interval:    ?QTC Calculation:   ?R Axis:     ?Text Interpretation:   ?  ? ?  ? ?Labs Reviewed - No data to display  ?No orders to display  ?  ?Medications - No data to display  ? ?Procedures  /  Critical  Care ?Procedures ? ?ED Course and Medical Decision Making  ?Initial Impression and Ddx ?Patient resting comfortably in no acute distress, normal vital signs, was here in the emergency department only a few hours ago.  This is the patient's ninth ED visit over the past month.  Nasal congestion, likely viral versus allergic, appropriate for discharge on symptomatic management. ? ?Past medical/surgical history that increases complexity of ED encounter: HIV ? ?Interpretation of Diagnostics ?Not applicable ? ?Patient Reassessment and Ultimate Disposition/Management ?Discharge ? ?Patient management required discussion with the following services or consulting groups:  None ? ?Complexity of Problems Addressed ?Acute complicated illness or Injury ? ?Additional Data Reviewed and Analyzed ?Further history obtained from: ?None ? ?Additional Factors Impacting ED Encounter Risk ?Prescriptions ? ?Elmer Sow. Pilar Plate, MD ?[facility]  Emergency Medicine ?[facility] [email or web address] ? ?Final Clinical Impressions(s) / ED Diagnoses  ? ?  ICD-10-CM   ?1. Nasal congestion  R09.81   ?  ?  ?ED Discharge Orders   ? ?      Ordered  ?  fluticasone (FLONASE) 50 MCG/ACT nasal spray  Daily       ? 03/04/22 0222  ? ?  ?  ? ?  ?  ? ?Discharge Instructions Discussed with and Provided to Patient:  ? ? ?Discharge Instructions   ? ?  ?You were evaluated in the Emergency Department and after careful evaluation, we did not find any emergent condition requiring admission or further testing in the hospital. ? ?Your exam/testing today was overall reassuring.  Use the nasal spray as needed. ? ?Please return to the Emergency Department if you experience any worsening of your condition.  Thank you for allowing [place] to be a part of your care. ? ? ? ? ?  ?Sabas Sous, MD ?03/04/22 0225 ? ?

## 2022-03-16 DIAGNOSIS — F1020 Alcohol dependence, uncomplicated: Secondary | ICD-10-CM | POA: Insufficient documentation

## 2022-03-18 ENCOUNTER — Other Ambulatory Visit (HOSPITAL_COMMUNITY): Payer: Self-pay

## 2022-03-18 ENCOUNTER — Telehealth (HOSPITAL_COMMUNITY): Payer: Self-pay | Admitting: Family Medicine

## 2022-03-18 NOTE — BH Assessment (Signed)
Care Management - Follow Up Discharges   Writer attempted to make contact with patient today and was unsuccessful.  Voicemail is not set up.   Per chart review, patient was provided with outpatient resources.

## 2022-03-24 ENCOUNTER — Other Ambulatory Visit: Payer: Self-pay

## 2022-03-24 ENCOUNTER — Emergency Department (HOSPITAL_COMMUNITY)
Admission: EM | Admit: 2022-03-24 | Discharge: 2022-03-25 | Disposition: A | Discharge status: Home / Self Care | Payer: Medicaid Other | Attending: Emergency Medicine | Admitting: Emergency Medicine

## 2022-03-24 ENCOUNTER — Encounter (HOSPITAL_COMMUNITY): Payer: Self-pay

## 2022-03-24 DIAGNOSIS — Z79899 Other long term (current) drug therapy: Secondary | ICD-10-CM | POA: Diagnosis not present

## 2022-03-24 DIAGNOSIS — R45851 Suicidal ideations: Secondary | ICD-10-CM | POA: Insufficient documentation

## 2022-03-24 DIAGNOSIS — Z21 Asymptomatic human immunodeficiency virus [HIV] infection status: Secondary | ICD-10-CM | POA: Diagnosis not present

## 2022-03-24 DIAGNOSIS — I509 Heart failure, unspecified: Secondary | ICD-10-CM | POA: Diagnosis not present

## 2022-03-24 DIAGNOSIS — R7401 Elevation of levels of liver transaminase levels: Secondary | ICD-10-CM | POA: Insufficient documentation

## 2022-03-24 DIAGNOSIS — D72819 Decreased white blood cell count, unspecified: Secondary | ICD-10-CM | POA: Diagnosis present

## 2022-03-24 DIAGNOSIS — R7989 Other specified abnormal findings of blood chemistry: Secondary | ICD-10-CM

## 2022-03-25 LAB — CBC
HCT: 44.6 % (ref 39.0–52.0)
Hemoglobin: 14.4 g/dL (ref 13.0–17.0)
MCH: 30.8 pg (ref 26.0–34.0)
MCHC: 32.3 g/dL (ref 30.0–36.0)
MCV: 95.5 fL (ref 80.0–100.0)
Platelets: 275 10*3/uL (ref 150–400)
RBC: 4.67 MIL/uL (ref 4.22–5.81)
RDW: 14.8 % (ref 11.5–15.5)
WBC: 3.8 10*3/uL — ABNORMAL LOW (ref 4.0–10.5)
nRBC: 0 % (ref 0.0–0.2)

## 2022-03-25 LAB — COMPREHENSIVE METABOLIC PANEL
ALT: 70 U/L — ABNORMAL HIGH (ref 0–44)
AST: 119 U/L — ABNORMAL HIGH (ref 15–41)
Albumin: 3.6 g/dL (ref 3.5–5.0)
Alkaline Phosphatase: 57 U/L (ref 38–126)
Anion gap: 10 (ref 5–15)
BUN: 13 mg/dL (ref 6–20)
CO2: 22 mmol/L (ref 22–32)
Calcium: 9 mg/dL (ref 8.9–10.3)
Chloride: 108 mmol/L (ref 98–111)
Creatinine, Ser: 1.13 mg/dL (ref 0.61–1.24)
GFR, Estimated: >60 mL/min (ref >60)
Glucose, Bld: 80 mg/dL (ref 70–99)
Potassium: 3.8 mmol/L (ref 3.5–5.1)
Sodium: 140 mmol/L (ref 135–145)
Total Bilirubin: 0.9 mg/dL (ref 0.3–1.2)
Total Protein: 8.1 g/dL (ref 6.5–8.1)

## 2022-03-25 LAB — RAPID URINE DRUG SCREEN, HOSP PERFORMED
Amphetamines: NOT DETECTED
Barbiturates: NOT DETECTED
Benzodiazepines: NOT DETECTED
Cocaine: NOT DETECTED
Opiates: NOT DETECTED
Tetrahydrocannabinol: POSITIVE — AB

## 2022-03-25 LAB — ETHANOL: Alcohol, Ethyl (B): 131 mg/dL — ABNORMAL HIGH (ref <10)

## 2022-03-25 LAB — ACETAMINOPHEN LEVEL: Acetaminophen (Tylenol), Serum: <10 ug/mL — ABNORMAL LOW (ref 10–30)

## 2022-03-25 LAB — SALICYLATE LEVEL: Salicylate Lvl: <7 mg/dL — ABNORMAL LOW (ref 7.0–30.0)

## 2022-03-25 NOTE — ED Notes (Signed)
Provider at bedside

## 2022-03-25 NOTE — ED Triage Notes (Signed)
Pt reports he is here today due to SI . Pt denies any plan. Pt reports I just need help.

## 2022-03-25 NOTE — ED Provider Notes (Signed)
[facility] EMERGENCY DEPARTMENT Provider Note   CSN: [id number] Arrival date & time: 03/24/22  2248     History  Chief Complaint  Patient presents with   Suicidal    William Powers is a 41 y.o. male with a hx of CHF, PE, HIV, substance use and depression who presents to the ED with complaints of suicidal ideation that has been going on for "awhile." No alleviating/aggravating factors. No specific plan @ this time. States he needs help. Denies HI or hallucinations.   HPI     Home Medications Prior to Admission medications   Medication Sig Start Date End Date Taking? Authorizing Provider  BIKTARVY 50-200-25 MG TABS tablet Take 1 tablet by mouth daily. Patient not taking: Reported on 03/01/2022 11/18/21   Melene Plan, DO  fluticasone [facility]) 50 MCG/ACT nasal spray Place 1 spray into both nostrils daily for 7 days. 03/04/22 03/11/22  Sabas Sous, MD  metoprolol succinate (TOPROL-XL) 50 MG 24 hr tablet Take 1 tablet by mouth 2 times daily. Patient not taking: Reported on 03/01/2022 11/18/21   Melene Plan, DO  Potassium Chloride ER 20 MEQ TBCR Take 20 mEq by mouth daily at 6 (six) AM. Patient not taking: No sig reported 08/07/21 09/05/21  Bobette Mo, MD      Allergies    Bee venom, Lisinopril, and Sulfamethoxazole-trimethoprim    Review of Systems   Review of Systems  Constitutional:  Negative for chills and fever.  Respiratory:  Negative for shortness of breath.   Cardiovascular:  Negative for chest pain.  Gastrointestinal:  Negative for abdominal pain.  Psychiatric/Behavioral:  Positive for suicidal ideas. Negative for hallucinations.   All other systems reviewed and are negative.  Physical Exam Updated Vital Signs BP (!) 124/91   Pulse 94   Temp 98.6 F (37 C) (Oral)   Resp 16   Ht 6\' 4"  (1.93 m)   Wt 107.5 kg   SpO2 97%   BMI 28.85 kg/m  Physical Exam Vitals and nursing note reviewed.  Constitutional:      General: He is not in  acute distress.    Appearance: He is well-developed.  HENT:     Head: Normocephalic and atraumatic.  Eyes:     General:        Right eye: No discharge.        Left eye: No discharge.     Conjunctiva/sclera: Conjunctivae normal.  Cardiovascular:     Rate and Rhythm: Normal rate and regular rhythm.  Pulmonary:     Effort: Pulmonary effort is normal. No respiratory distress.     Breath sounds: Normal breath sounds.  Abdominal:     General: There is no distension.     Palpations: Abdomen is soft.  Neurological:     Mental Status: He is alert.     Comments: Clear speech.   Psychiatric:        Mood and Affect: Affect is not flat.        Speech: Speech normal.        Behavior: Behavior is not agitated or aggressive. Behavior is cooperative.    ED Results / Procedures / Treatments   Labs (all labs ordered are listed, but only abnormal results are displayed) Labs Reviewed  COMPREHENSIVE METABOLIC PANEL - Abnormal; Notable for the following components:      Result Value   AST 119 (*)    ALT 70 (*)    All other components within normal limits  ETHANOL - Abnormal; Notable for the following components:   Alcohol, Ethyl (B) 131 (*)    All other components within normal limits  SALICYLATE LEVEL - Abnormal; Notable for the following components:   Salicylate Lvl <7.0 (*)    All other components within normal limits  ACETAMINOPHEN LEVEL - Abnormal; Notable for the following components:   Acetaminophen (Tylenol), Serum <10 (*)    All other components within normal limits  CBC - Abnormal; Notable for the following components:   WBC 3.8 (*)    All other components within normal limits  RAPID URINE DRUG SCREEN, HOSP PERFORMED    EKG None  Radiology No results found.  Procedures Procedures    Medications Ordered in ED Medications - No data to display  ED Course/ Medical Decision Making/ A&P                           Medical Decision Making Amount and/or Complexity of Data  Reviewed Labs: ordered.   Patient presents to the ED with complaints of SI, this involves an extensive number of treatment options, and is a complaint that carries with it a high risk of complications and morbidity. Nontoxic, vitals w/ mildly elevated diastolic BP- doubt HTN emergency.   Additional history obtained:  Chart & nursing note reviewed.  Multiple ED visits and [facility] visits for similar.   Lab Tests:  I viewed & interpreted labs including:  CBC: mild leukopenia CMP: elevated LFTs Ethanol: elevated Acetaminophen/salicylate: negative UDS: Positive for tetrahydrocannibinol.   Patient given time to metabolize ETOH in the ED, he has been sleeping throughout the night, has eaten [facility] and states he is feeling better. SI chronic and passive in nature, overall seems reasonable for discharge from the ED with close [facility] outpatient follow up, [facility] information provided. I discussed results, treatment plan, need for follow-up, and return precautions with the patient. Provided opportunity for questions, patient confirmed understanding and is in agreement with plan.    Portions of this note were generated with Scientist, clinical (histocompatibility and immunogenetics). Dictation errors may occur despite best attempts at proofreading.  Final Clinical Impression(s) / ED Diagnoses Final diagnoses:  Suicidal ideation  Leukopenia, unspecified type  Elevated LFTs    Rx / DC Orders ED Discharge Orders     None         Cherly Anderson, PA-C 03/25/22 9242    [name], MD 03/25/22 [phone]

## 2022-03-25 NOTE — Discharge Instructions (Addendum)
Your labs showed that your white blood cell count was mildly low and your liver function test were elevated, please have this rechecked by your primary care provider.  Please try to decrease your alcohol intake over time.   Please follow-up with behavioral health urgent care, you may go there at any time as they are open 24/7.  Return to the ER for any new or worsening symptoms or any other concerns.

## 2022-04-02 ENCOUNTER — Other Ambulatory Visit (HOSPITAL_COMMUNITY): Payer: Self-pay

## 2022-04-25 ENCOUNTER — Other Ambulatory Visit: Payer: Self-pay

## 2022-04-25 ENCOUNTER — Encounter (HOSPITAL_COMMUNITY): Payer: Self-pay | Admitting: Emergency Medicine

## 2022-04-25 ENCOUNTER — Emergency Department (HOSPITAL_COMMUNITY): Payer: Medicaid Other

## 2022-04-25 ENCOUNTER — Emergency Department (HOSPITAL_COMMUNITY)
Admission: EM | Admit: 2022-04-25 | Discharge: 2022-04-26 | Discharge status: Against Medical Advice | Payer: Medicaid Other | Source: Home / Self Care

## 2022-04-25 ENCOUNTER — Encounter (HOSPITAL_BASED_OUTPATIENT_CLINIC_OR_DEPARTMENT_OTHER): Payer: Self-pay | Admitting: Emergency Medicine

## 2022-04-25 ENCOUNTER — Emergency Department (HOSPITAL_BASED_OUTPATIENT_CLINIC_OR_DEPARTMENT_OTHER)
Admission: EM | Admit: 2022-04-25 | Discharge: 2022-04-25 | Disposition: A | Discharge status: Home / Self Care | Payer: Medicaid Other | Attending: Emergency Medicine | Admitting: Emergency Medicine

## 2022-04-25 DIAGNOSIS — Z5321 Procedure and treatment not carried out due to patient leaving prior to being seen by health care provider: Secondary | ICD-10-CM | POA: Insufficient documentation

## 2022-04-25 DIAGNOSIS — R109 Unspecified abdominal pain: Secondary | ICD-10-CM | POA: Insufficient documentation

## 2022-04-25 DIAGNOSIS — R2243 Localized swelling, mass and lump, lower limb, bilateral: Secondary | ICD-10-CM | POA: Insufficient documentation

## 2022-04-25 DIAGNOSIS — R079 Chest pain, unspecified: Secondary | ICD-10-CM | POA: Insufficient documentation

## 2022-04-25 DIAGNOSIS — R0602 Shortness of breath: Secondary | ICD-10-CM | POA: Diagnosis present

## 2022-04-25 LAB — CBC
HCT: 40.6 % (ref 39.0–52.0)
Hemoglobin: 13.6 g/dL (ref 13.0–17.0)
MCH: 31.1 pg (ref 26.0–34.0)
MCHC: 33.5 g/dL (ref 30.0–36.0)
MCV: 92.9 fL (ref 80.0–100.0)
Platelets: 255 10*3/uL (ref 150–400)
RBC: 4.37 MIL/uL (ref 4.22–5.81)
RDW: 13.5 % (ref 11.5–15.5)
WBC: 4.1 10*3/uL (ref 4.0–10.5)
nRBC: 0 % (ref 0.0–0.2)

## 2022-04-25 NOTE — ED Triage Notes (Signed)
Per EMS pt traveling by foot today and developed SOB and called EMS c/o headache and dizziness.

## 2022-04-25 NOTE — ED Provider Notes (Signed)
[facility] EMERGENCY DEPARTMENT Provider Note   CSN: [id number] Arrival date & time: 04/25/22  9038     History  No chief complaint on file.   William Powers is a 41 y.o. male.  41 yo M with a cc of sob.  Patient was in another part of town and was trying to walk back to his hotel.  Got a bit tired and sob, was having trouble figuring out how to use his uber app on his phone so called ems.  This facility was closer to his hotel and so requested to come here.  He feels much better as far as his shortness of breath because of the stopped walking.  Denies cough or congestion denies chest pain.  Denies leg swelling.        Home Medications Prior to Admission medications   Medication Sig Start Date End Date Taking? Authorizing Provider  BIKTARVY 50-200-25 MG TABS tablet Take 1 tablet by mouth daily. Patient not taking: Reported on 03/01/2022 11/18/21   Melene Plan, DO  fluticasone [facility]) 50 MCG/ACT nasal spray Place 1 spray into both nostrils daily for 7 days. 03/04/22 03/11/22  Sabas Sous, MD  metoprolol succinate (TOPROL-XL) 50 MG 24 hr tablet Take 1 tablet by mouth 2 times daily. Patient not taking: Reported on 03/01/2022 11/18/21   Melene Plan, DO  Potassium Chloride ER 20 MEQ TBCR Take 20 mEq by mouth daily at 6 (six) AM. Patient not taking: No sig reported 08/07/21 09/05/21  Bobette Mo, MD      Allergies    Bee venom, Lisinopril, and Sulfamethoxazole-trimethoprim    Review of Systems   Review of Systems  Physical Exam Updated Vital Signs BP 119/79 (BP Location: Right Arm)   Pulse 94   Temp 97.9 F (36.6 C) (Oral)   Resp 16   Ht 6\' 4"  (1.93 m)   Wt 108 kg   SpO2 98%   BMI 28.97 kg/m  Physical Exam Vitals and nursing note reviewed.  Constitutional:      Appearance: He is well-developed.  HENT:     Head: Normocephalic and atraumatic.  Eyes:     Pupils: Pupils are equal, round, and reactive to light.  Neck:     Vascular: No JVD.   Cardiovascular:     Rate and Rhythm: Normal rate and regular rhythm.     Heart sounds: No murmur heard.    No friction rub. No gallop.  Pulmonary:     Effort: No respiratory distress.     Breath sounds: No wheezing.  Abdominal:     General: There is no distension.     Tenderness: There is no abdominal tenderness. There is no guarding or rebound.  Musculoskeletal:        General: Normal range of motion.     Cervical back: Normal range of motion and neck supple.  Skin:    Coloration: Skin is not pale.     Findings: No rash.  Neurological:     Mental Status: He is alert and oriented to person, place, and time.  Psychiatric:        Behavior: Behavior normal.     ED Results / Procedures / Treatments   Labs (all labs ordered are listed, but only abnormal results are displayed) Labs Reviewed - No data to display  EKG None  Radiology No results found.  Procedures Procedures    Medications Ordered in ED Medications - No data to display  ED Course/ Medical  Decision Making/ A&P                           Medical Decision Making  41 yo M with a chief complaint of difficulty breathing.  The patient to me he got tired when he was walking from where he had been hanging out this evening to his hotel.  He is having trouble getting his [employer] voucher to work and eventually called EMS.  He had them take them here because this was closer to his destination.  Upon arrival the nursing staff was able to get his over after work and he request to go home.  He declines any imaging or any testing here.  I encouraged him to follow-up with his family doctor in the office.  3:45 AM:  I have discussed the diagnosis/risks/treatment options with the patient.  Evaluation and diagnostic testing in the emergency department does not suggest an emergent condition requiring admission or immediate intervention beyond what has been performed at this time.  They will follow up with  PCP. We also discussed  returning to the ED immediately if new or worsening sx occur. We discussed the sx which are most concerning (e.g., sudden worsening pain, fever, inability to tolerate by mouth) that necessitate immediate return. Medications administered to the patient during their visit and any new prescriptions provided to the patient are listed below.  Medications given during this visit Medications - No data to display   The patient appears reasonably screen and/or stabilized for discharge and I doubt any other medical condition or other [facility] requiring further screening, evaluation, or treatment in the ED at this time prior to discharge.          Final Clinical Impression(s) / ED Diagnoses Final diagnoses:  SOB (shortness of breath)    Rx / DC Orders ED Discharge Orders     None         Melene Plan, DO 04/25/22 0345

## 2022-04-25 NOTE — Discharge Instructions (Signed)
Follow up with your doctor in the office.  

## 2022-04-25 NOTE — ED Triage Notes (Signed)
Pt c/o left sided chest pain, worse with movement, bilateral leg swelling and abdominal tightness x 1 month. States he has been out of his metoprolol as well.

## 2022-04-26 LAB — BASIC METABOLIC PANEL
Anion gap: 12 (ref 5–15)
BUN: 15 mg/dL (ref 6–20)
CO2: 18 mmol/L — ABNORMAL LOW (ref 22–32)
Calcium: 8.8 mg/dL — ABNORMAL LOW (ref 8.9–10.3)
Chloride: 107 mmol/L (ref 98–111)
Creatinine, Ser: 1.09 mg/dL (ref 0.61–1.24)
GFR, Estimated: >60 mL/min (ref >60)
Glucose, Bld: 98 mg/dL (ref 70–99)
Potassium: 3.2 mmol/L — ABNORMAL LOW (ref 3.5–5.1)
Sodium: 137 mmol/L (ref 135–145)

## 2022-04-26 LAB — TROPONIN I (HIGH SENSITIVITY)
Troponin I (High Sensitivity): 6 ng/L (ref <18)
Troponin I (High Sensitivity): 6 ng/L (ref <18)

## 2022-04-26 NOTE — ED Notes (Signed)
Called pt x3 for vitals, no response. 

## 2022-04-28 ENCOUNTER — Other Ambulatory Visit: Payer: Self-pay

## 2022-04-28 ENCOUNTER — Encounter (HOSPITAL_COMMUNITY): Payer: Self-pay

## 2022-04-28 ENCOUNTER — Emergency Department (HOSPITAL_COMMUNITY)
Admission: EM | Admit: 2022-04-28 | Discharge: 2022-04-29 | Disposition: A | Discharge status: Home / Self Care | Payer: Medicaid Other | Attending: Emergency Medicine | Admitting: Emergency Medicine

## 2022-04-28 DIAGNOSIS — F1910 Other psychoactive substance abuse, uncomplicated: Secondary | ICD-10-CM | POA: Diagnosis present

## 2022-04-28 DIAGNOSIS — Z5321 Procedure and treatment not carried out due to patient leaving prior to being seen by health care provider: Secondary | ICD-10-CM | POA: Diagnosis not present

## 2022-04-28 DIAGNOSIS — R45851 Suicidal ideations: Secondary | ICD-10-CM | POA: Insufficient documentation

## 2022-04-28 DIAGNOSIS — R441 Visual hallucinations: Secondary | ICD-10-CM | POA: Diagnosis not present

## 2022-04-28 DIAGNOSIS — Y907 Blood alcohol level of 200-239 mg/100 ml: Secondary | ICD-10-CM | POA: Diagnosis not present

## 2022-04-28 LAB — URINALYSIS, ROUTINE W REFLEX MICROSCOPIC
Bacteria, UA: NONE SEEN
Bilirubin Urine: NEGATIVE
Glucose, UA: NEGATIVE mg/dL
Ketones, ur: NEGATIVE mg/dL
Leukocytes,Ua: NEGATIVE
Nitrite: NEGATIVE
Protein, ur: NEGATIVE mg/dL
Specific Gravity, Urine: 1.025 (ref 1.005–1.030)
pH: 5 (ref 5.0–8.0)

## 2022-04-28 LAB — COMPREHENSIVE METABOLIC PANEL
ALT: 42 U/L (ref 0–44)
AST: 34 U/L (ref 15–41)
Albumin: 3.7 g/dL (ref 3.5–5.0)
Alkaline Phosphatase: 54 U/L (ref 38–126)
Anion gap: 11 (ref 5–15)
BUN: 12 mg/dL (ref 6–20)
CO2: 20 mmol/L — ABNORMAL LOW (ref 22–32)
Calcium: 8.8 mg/dL — ABNORMAL LOW (ref 8.9–10.3)
Chloride: 108 mmol/L (ref 98–111)
Creatinine, Ser: 1.08 mg/dL (ref 0.61–1.24)
GFR, Estimated: >60 mL/min (ref >60)
Glucose, Bld: 83 mg/dL (ref 70–99)
Potassium: 3.3 mmol/L — ABNORMAL LOW (ref 3.5–5.1)
Sodium: 139 mmol/L (ref 135–145)
Total Bilirubin: 0.5 mg/dL (ref 0.3–1.2)
Total Protein: 7.9 g/dL (ref 6.5–8.1)

## 2022-04-28 LAB — CBC
HCT: 40.3 % (ref 39.0–52.0)
Hemoglobin: 13.4 g/dL (ref 13.0–17.0)
MCH: 30.9 pg (ref 26.0–34.0)
MCHC: 33.3 g/dL (ref 30.0–36.0)
MCV: 93.1 fL (ref 80.0–100.0)
Platelets: 238 10*3/uL (ref 150–400)
RBC: 4.33 MIL/uL (ref 4.22–5.81)
RDW: 13.4 % (ref 11.5–15.5)
WBC: 5 10*3/uL (ref 4.0–10.5)
nRBC: 0 % (ref 0.0–0.2)

## 2022-04-28 LAB — TROPONIN I (HIGH SENSITIVITY): Troponin I (High Sensitivity): 6 ng/L (ref <18)

## 2022-04-28 LAB — RAPID URINE DRUG SCREEN, HOSP PERFORMED
Amphetamines: NOT DETECTED
Barbiturates: NOT DETECTED
Benzodiazepines: NOT DETECTED
Cocaine: NOT DETECTED
Opiates: NOT DETECTED
Tetrahydrocannabinol: POSITIVE — AB

## 2022-04-28 LAB — ETHANOL: Alcohol, Ethyl (B): 201 mg/dL — ABNORMAL HIGH (ref <10)

## 2022-04-28 LAB — SALICYLATE LEVEL: Salicylate Lvl: <7 mg/dL — ABNORMAL LOW (ref 7.0–30.0)

## 2022-04-28 LAB — ACETAMINOPHEN LEVEL: Acetaminophen (Tylenol), Serum: <10 ug/mL — ABNORMAL LOW (ref 10–30)

## 2022-04-28 NOTE — ED Notes (Signed)
Pt reports he used crack cocaine "I did a drug cocaine sandwich." Admits ETOH use as well.

## 2022-04-28 NOTE — ED Triage Notes (Signed)
PER EMS: pt was picked up by EMS on the side of the road laying on the side walk, he reports multiple drug use, SI, visual hallucinations in which is yelling at someone who is not present, boisterous behavior upon arrival.  BP- 120/58, HR-100, O2-97%, CBG-85

## 2022-04-28 NOTE — ED Provider Notes (Signed)
Patient medically cleared at this time for psychiatric evaluation.   Cristopher Peru, PA-C 04/28/22 2314    Derwood Kaplan, MD 04/29/22 1743
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
## 2022-04-29 DIAGNOSIS — F1910 Other psychoactive substance abuse, uncomplicated: Secondary | ICD-10-CM

## 2022-04-29 NOTE — BH Assessment (Signed)
Clinician attempted to make contact with pt's team in an effort to complete pt's MH Assessment without success. TTS to attempt assessment at a later time.

## 2022-04-29 NOTE — Consult Note (Signed)
[facility] ED ASSESSMENT   Reason for Consult:  Evaluation Referring Physician:  Leotis Shames, PA Patient Identification: William Powers MRN:  [id number] ED Chief Complaint: Polysubstance abuse [facility])  Diagnosis:  Principal Problem:   Polysubstance abuse [facility])   ED Assessment Time Calculation: No data recorded  Subjective:   William Powers is a 41 y.o. male patient picked up by EMS due to laying on the side walk, reporting SI, and appeared to be hallucinating. Pt reported that he ate a cocaine sandwich.   HPI:   Patient seen this morning in his room at [facility] for face to face evaluation. He tells me he does not feel well this morning, reported some nausea and a headache. He tells me he has been drinking alcohol daily and has also been using cocaine almost daily for a few weeks. Denies any hx of withdrawal seizure or DT.  Pt states he currently lives in a transition house, and receives disability. States he does not remember yelling on the sidewalk or reporting SI. He denies any current suicidal or homicidal ideations. He denies auditory or visual hallucinations. He tells me he feels like he has depression due to feeling worthless and sad at times, and reports using substances as coping skills. When asked why he has never followed up with any substance abuse resources given to him at previous ED visits before, he shrugs his shoulders and states "I don't know." He denies any hx of suicide attempts or self harm. He denies being on any current psychotropics, and when offered he denies to start any medications. He does mention that he is interested in residential treatment for substance abuse. We had a discussion about the detrimental effects of his substance abuse which he seemed insightful about. I offered him inpatient treatment or facility based crisis at [facility] for further stabilization and detox but the patient declined. Stated he only wants resources for substance abuse and to be discharged.   Past  Psychiatric History:  -Over 25+ ED visits in 2023 - many of them for SI and substance abuse, some for medical evaluations - Previous hospitalization at [facility] and [facility]  Risk to Self or Others: Is the patient at risk to self? No Has the patient been a risk to self in the past 6 months? No Has the patient been a risk to self within the distant past? No Is the patient a risk to others? No Has the patient been a risk to others in the past 6 months? No Has the patient been a risk to others within the distant past? No  [place] Scale:  Flowsheet Row ED from 04/28/2022 in [facility] EMERGENCY DEPARTMENT ED from 04/25/2022 in [facility] EMERGENCY DEPARTMENT ED from 03/24/2022 in [facility] EMERGENCY DEPARTMENT  C-SSRS RISK CATEGORY No Risk No Risk High Risk        Past Medical History:  Past Medical History:  Diagnosis Date   Heart failure (HCC)    HFrEF (heart failure with reduced ejection fraction) (HCC)    15% EF as of 2019   HIV (human immunodeficiency virus infection) (HCC)    Hypertension    Polysubstance abuse (HCC)    Pulmonary embolism (HCC)     Past Surgical History:  Procedure Laterality Date   HERNIA REPAIR     Family History:  Family History  Problem Relation Age of Onset   Lupus Mother    HIV Mother    HIV Father    Social  History:  Social History   Substance and Sexual Activity  Alcohol Use Yes   Comment: 10 pints per week     Social History   Substance and Sexual Activity  Drug Use Yes   Types: Marijuana, Cocaine    Social History   Socioeconomic History   Marital status: Divorced    Spouse name: Not on file   Number of children: Not on file   Years of education: Not on file   Highest education level: Not on file  Occupational History   Not on file  Tobacco Use   Smoking status: Some Days    Packs/day: 0.50    Types: Cigarettes, Cigars   Smokeless tobacco: Never  Vaping Use   Vaping Use: Never  used  Substance and Sexual Activity   Alcohol use: Yes    Comment: 10 pints per week   Drug use: Yes    Types: Marijuana, Cocaine   Sexual activity: Not on file  Other Topics Concern   Not on file  Social History Narrative   Not on file   Social Determinants of Health   Financial Resource Strain: Not on file  Food Insecurity: Not on file  Transportation Needs: Not on file  Physical Activity: Not on file  Stress: Not on file  Social Connections: Not on file   Additional Social History:    Allergies:   Allergies  Allergen Reactions   Bee Venom Anaphylaxis   Lisinopril Swelling    Reported on 5/14 by him to his my chart account : was in ED for swollen lips     Sulfamethoxazole-Trimethoprim Swelling and Rash    Labs:  Results for orders placed or performed during the hospital encounter of 04/28/22 (from the past 48 hour(s))  Comprehensive metabolic panel     Status: Abnormal   Collection Time: 04/28/22 10:04 PM  Result Value Ref Range   Sodium 139 135 - 145 mmol/L   Potassium 3.3 (L) 3.5 - 5.1 mmol/L   Chloride 108 98 - 111 mmol/L   CO2 20 (L) 22 - 32 mmol/L   Glucose, Bld 83 70 - 99 mg/dL    Comment: Glucose reference range applies only to samples taken after fasting for at least 8 hours.   BUN 12 6 - 20 mg/dL   Creatinine, Ser 1.08 0.61 - 1.24 mg/dL   Calcium 8.8 (L) 8.9 - 10.3 mg/dL   Total Protein 7.9 6.5 - 8.1 g/dL   Albumin 3.7 3.5 - 5.0 g/dL   AST 34 15 - 41 U/L   ALT 42 0 - 44 U/L   Alkaline Phosphatase 54 38 - 126 U/L   Total Bilirubin 0.5 0.3 - 1.2 mg/dL   GFR, Estimated >60 >60 mL/min    Comment: (NOTE) Calculated using the CKD-EPI Creatinine Equation (2021)    Anion gap 11 5 - 15    Comment: Performed at [facility] [address] [place], [place] [address]  Ethanol     Status: Abnormal   Collection Time: 04/28/22 10:04 PM  Result Value Ref Range   Alcohol, Ethyl (B) 201 (H) <10 mg/dL    Comment: (NOTE) Lowest detectable limit for  serum alcohol is 10 mg/dL.  For medical purposes only. Performed at [facility], [place] [address], [place], [place] [id number]   Salicylate level     Status: Abnormal   Collection Time: 04/28/22 10:04 PM  Result Value Ref Range   Salicylate Lvl Q000111Q (L) 7.0 -  30.0 mg/dL    Comment: Performed at [facility], [address] [place], [place] [address]  Acetaminophen level     Status: Abnormal   Collection Time: 04/28/22 10:04 PM  Result Value Ref Range   Acetaminophen (Tylenol), Serum <10 (L) 10 - 30 ug/mL    Comment: (NOTE) Therapeutic concentrations vary significantly. A range of 10-30 ug/mL  may be an effective concentration for many patients. However, some  are best treated at concentrations outside of this range. Acetaminophen concentrations >150 ug/mL at 4 hours after ingestion  and >50 ug/mL at 12 hours after ingestion are often associated with  toxic reactions.  Performed at [facility], [address], [place], [place] [address]   cbc     Status: None   Collection Time: 04/28/22 10:04 PM  Result Value Ref Range   WBC 5.0 4.0 - 10.5 K/uL   RBC 4.33 4.22 - 5.81 MIL/uL   Hemoglobin 13.4 13.0 - 17.0 g/dL   HCT 94.8 54.6 - 27.0 %   MCV 93.1 80.0 - 100.0 fL   MCH 30.9 26.0 - 34.0 pg   MCHC 33.3 30.0 - 36.0 g/dL   RDW 35.0 09.3 - 81.8 %   Platelets 238 150 - 400 K/uL   nRBC 0.0 0.0 - 0.2 %    Comment: Performed at [facility], [address], [place], [place] [address]  Rapid urine drug screen (hospital performed)     Status: Abnormal   Collection Time: 04/28/22 10:04 PM  Result Value Ref Range   Opiates NONE DETECTED NONE DETECTED   Cocaine NONE DETECTED NONE DETECTED   Benzodiazepines NONE DETECTED NONE DETECTED   Amphetamines NONE DETECTED NONE DETECTED   Tetrahydrocannabinol POSITIVE (A) NONE DETECTED   Barbiturates NONE DETECTED NONE DETECTED    Comment: (NOTE) DRUG SCREEN FOR MEDICAL PURPOSES ONLY.  IF CONFIRMATION IS NEEDED FOR ANY  PURPOSE, NOTIFY LAB WITHIN 5 DAYS.  LOWEST DETECTABLE LIMITS FOR URINE DRUG SCREEN Drug Class                     Cutoff (ng/mL) Amphetamine and metabolites    1000 Barbiturate and metabolites    200 Benzodiazepine                 200 Tricyclics and metabolites     300 Opiates and metabolites        300 Cocaine and metabolites        300 THC                            50 Performed at [facility], [address] [place], [place] [address]   Urinalysis, Routine w reflex microscopic     Status: Abnormal   Collection Time: 04/28/22 10:04 PM  Result Value Ref Range   Color, Urine YELLOW YELLOW   APPearance CLEAR CLEAR   Specific Gravity, Urine 1.025 1.005 - 1.030   pH 5.0 5.0 - 8.0   Glucose, UA NEGATIVE NEGATIVE mg/dL   Hgb urine dipstick SMALL (A) NEGATIVE   Bilirubin Urine NEGATIVE NEGATIVE   Ketones, ur NEGATIVE NEGATIVE mg/dL   Protein, ur NEGATIVE NEGATIVE mg/dL   Nitrite NEGATIVE NEGATIVE   Leukocytes,Ua NEGATIVE NEGATIVE   RBC / HPF 0-5 0 - 5 RBC/hpf   WBC, UA 6-10 0 - 5 WBC/hpf   Bacteria, UA NONE SEEN NONE SEEN   Squamous Epithelial /  LPF 0-5 0 - 5   Mucus PRESENT     Comment: Performed at [facility], [place] [address], [place], [place] [address]  Troponin I (High Sensitivity)     Status: None   Collection Time: 04/28/22 10:04 PM  Result Value Ref Range   Troponin I (High Sensitivity) 6 <18 ng/L    Comment: (NOTE) Elevated high sensitivity troponin I (hsTnI) values and significant  changes across serial measurements may suggest ACS but many other  chronic and acute conditions are known to elevate hsTnI results.  Refer to the "Links" section for chest pain algorithms and additional  guidance. Performed at [facility], [place] [address] [place], [place] [address]     No current facility-administered medications for this encounter.   Current Outpatient Medications  Medication Sig Dispense Refill   BIKTARVY 50-200-25 MG TABS tablet Take 1  tablet by mouth daily. (Patient not taking: Reported on 03/01/2022) 30 tablet 0   metoprolol succinate (TOPROL-XL) 50 MG 24 hr tablet Take 1 tablet by mouth 2 times daily. (Patient not taking: Reported on 03/01/2022) 60 tablet 0    Psychiatric Specialty Exam: Presentation  General Appearance: Appropriate for Environment  Eye Contact:Fair  Speech:Clear and Coherent  Speech Volume:Normal  Handedness:Right   Mood and Affect  Mood:Euthymic  Affect:Congruent   Thought Process  Thought Processes:Goal Directed  Descriptions of Associations:Intact  Orientation:Full (Time, Place and Person)  Thought Content:Logical  History of Schizophrenia/Schizoaffective disorder:No  Duration of Psychotic Symptoms:N/A  Hallucinations:Hallucinations: None  Ideas of Reference:None  Suicidal Thoughts:Suicidal Thoughts: No  Homicidal Thoughts:Homicidal Thoughts: No   Sensorium  Memory:Immediate Good  Judgment:Fair  Insight:Fair   Executive Functions  Concentration:Good  Attention Span:Good  [place] of Knowledge:Good  Language:Good   Psychomotor Activity  Psychomotor Activity:Psychomotor Activity: Normal   Assets  Assets:Communication Skills; Desire for Improvement; Physical Health; Social Support    Sleep  Sleep:Sleep: Fair   Physical Exam: Physical Exam Vitals reviewed.  Neurological:     Mental Status: He is alert.  Psychiatric:        Behavior: Behavior is cooperative.        Thought Content: Thought content normal.    Review of Systems  Gastrointestinal:  Positive for nausea.  Neurological:  Positive for headaches.  Psychiatric/Behavioral:  Positive for substance abuse. Negative for hallucinations and suicidal ideas.    Blood pressure 110/67, pulse 90, temperature 98 F (36.7 C), temperature source Oral, resp. rate 16, height 6\' 4"  (1.93 m), weight 108 kg, SpO2 100 %. Body mass index is 28.97 kg/m.  Medical Decision Making: At this  time, pt does not meet criteria for IVC or inpatient psychiatric hospitalization. [facility] was offered, and patient declined. Resources for substance abuse given in AVS. Pt case reviewed and discussed with Dr. Dwyane Dee. RN, LCSW, and EDP notified of disposition.   Problem 1: Polysubstance Abuse - Pt given resources for adult IOP and residential substance abuse resources   Disposition: No evidence of imminent risk to self or others at present.   Patient does not meet criteria for psychiatric inpatient admission. Supportive therapy provided about ongoing stressors. Discussed crisis plan, support from social network, calling 911, coming to the Emergency Department, and calling Suicide Hotline.  Vesta Mixer, NP 04/29/2022 10:17 AM

## 2022-04-29 NOTE — ED Provider Notes (Addendum)
Emergency Medicine Observation Re-evaluation Note  William Powers is a 41 y.o. male, seen on rounds today.  Pt initially presented to the ED for complaints of Suicidal Currently, the patient is awake and alert.  Pt was seen last night for SI.  He was intoxicated on alcohol and MJ.  He rested well and feels better.  TTS could not assess last night due to intoxicated state.  Physical Exam  BP 110/67 (BP Location: Right Arm)   Pulse 90   Temp 98 F (36.7 C) (Oral)   Resp 16   Ht 6\' 4"  (1.93 m)   Wt 108 kg   SpO2 100%   BMI 28.97 kg/m  Physical Exam General: awake and alert Cardiac: rrr Lungs: cta b Psych: calm  ED Course / MDM  EKG:   I have reviewed the labs performed to date as well as medications administered while in observation.  Recent changes in the last 24 hours include none.  Plan  Current plan is for waiting for TTS eval.  Pt now wants to leave.  He no longer endorses si.  He is stable for d/c.  He is given resources.  Return if worse.   [facility] is not under involuntary commitment.     Harrison Mons, MD 04/29/22 06/30/22    [id number], MD 04/29/22 1013    06/30/22, MD 05/17/22 2028

## 2022-04-29 NOTE — ED Notes (Signed)
Breakfast order placed ?

## 2022-05-01 ENCOUNTER — Emergency Department (HOSPITAL_COMMUNITY): Payer: Medicaid Other

## 2022-05-01 ENCOUNTER — Other Ambulatory Visit: Payer: Self-pay

## 2022-05-01 ENCOUNTER — Emergency Department (HOSPITAL_COMMUNITY)
Admission: EM | Admit: 2022-05-01 | Discharge: 2022-05-01 | Disposition: A | Discharge status: Home / Self Care | Payer: Medicaid Other | Attending: Emergency Medicine | Admitting: Emergency Medicine

## 2022-05-01 ENCOUNTER — Encounter (HOSPITAL_COMMUNITY): Payer: Self-pay

## 2022-05-01 ENCOUNTER — Ambulatory Visit (HOSPITAL_COMMUNITY)
Admission: EM | Admit: 2022-05-01 | Discharge: 2022-05-02 | Disposition: A | Discharge status: Home / Self Care | Payer: Medicaid Other | Source: Home / Self Care

## 2022-05-01 DIAGNOSIS — F332 Major depressive disorder, recurrent severe without psychotic features: Secondary | ICD-10-CM | POA: Insufficient documentation

## 2022-05-01 DIAGNOSIS — R45851 Suicidal ideations: Secondary | ICD-10-CM

## 2022-05-01 DIAGNOSIS — Z21 Asymptomatic human immunodeficiency virus [HIV] infection status: Secondary | ICD-10-CM | POA: Insufficient documentation

## 2022-05-01 DIAGNOSIS — Z5900 Homelessness unspecified: Secondary | ICD-10-CM | POA: Insufficient documentation

## 2022-05-01 DIAGNOSIS — F1020 Alcohol dependence, uncomplicated: Secondary | ICD-10-CM | POA: Insufficient documentation

## 2022-05-01 DIAGNOSIS — Z20822 Contact with and (suspected) exposure to covid-19: Secondary | ICD-10-CM | POA: Insufficient documentation

## 2022-05-01 DIAGNOSIS — I509 Heart failure, unspecified: Secondary | ICD-10-CM | POA: Insufficient documentation

## 2022-05-01 DIAGNOSIS — R519 Headache, unspecified: Secondary | ICD-10-CM | POA: Insufficient documentation

## 2022-05-01 DIAGNOSIS — I110 Hypertensive heart disease with heart failure: Secondary | ICD-10-CM | POA: Diagnosis not present

## 2022-05-01 DIAGNOSIS — F17210 Nicotine dependence, cigarettes, uncomplicated: Secondary | ICD-10-CM | POA: Insufficient documentation

## 2022-05-01 DIAGNOSIS — F32A Depression, unspecified: Secondary | ICD-10-CM | POA: Insufficient documentation

## 2022-05-01 LAB — CBC WITH DIFFERENTIAL/PLATELET
Abs Immature Granulocytes: 0.03 10*3/uL (ref 0.00–0.07)
Basophils Absolute: 0 10*3/uL (ref 0.0–0.1)
Basophils Relative: 1 %
Eosinophils Absolute: 0 10*3/uL (ref 0.0–0.5)
Eosinophils Relative: 1 %
HCT: 41.3 % (ref 39.0–52.0)
Hemoglobin: 13.8 g/dL (ref 13.0–17.0)
Immature Granulocytes: 1 %
Lymphocytes Relative: 25 %
Lymphs Abs: 1.1 10*3/uL (ref 0.7–4.0)
MCH: 31.1 pg (ref 26.0–34.0)
MCHC: 33.4 g/dL (ref 30.0–36.0)
MCV: 93 fL (ref 80.0–100.0)
Monocytes Absolute: 0.4 10*3/uL (ref 0.1–1.0)
Monocytes Relative: 9 %
Neutro Abs: 2.7 10*3/uL (ref 1.7–7.7)
Neutrophils Relative %: 63 %
Platelets: 253 10*3/uL (ref 150–400)
RBC: 4.44 MIL/uL (ref 4.22–5.81)
RDW: 13.6 % (ref 11.5–15.5)
WBC: 4.3 10*3/uL (ref 4.0–10.5)
nRBC: 0 % (ref 0.0–0.2)

## 2022-05-01 LAB — RESP PANEL BY RT-PCR (FLU A&B, COVID) ARPGX2
Influenza A by PCR: NEGATIVE
Influenza B by PCR: NEGATIVE
SARS Coronavirus 2 by RT PCR: NEGATIVE

## 2022-05-01 LAB — COMPREHENSIVE METABOLIC PANEL
ALT: 34 U/L (ref 0–44)
AST: 30 U/L (ref 15–41)
Albumin: 3.8 g/dL (ref 3.5–5.0)
Alkaline Phosphatase: 53 U/L (ref 38–126)
Anion gap: 11 (ref 5–15)
BUN: 14 mg/dL (ref 6–20)
CO2: 21 mmol/L — ABNORMAL LOW (ref 22–32)
Calcium: 8.9 mg/dL (ref 8.9–10.3)
Chloride: 108 mmol/L (ref 98–111)
Creatinine, Ser: 1.12 mg/dL (ref 0.61–1.24)
GFR, Estimated: >60 mL/min (ref >60)
Glucose, Bld: 76 mg/dL (ref 70–99)
Potassium: 3.7 mmol/L (ref 3.5–5.1)
Sodium: 140 mmol/L (ref 135–145)
Total Bilirubin: 0.4 mg/dL (ref 0.3–1.2)
Total Protein: 8.2 g/dL — ABNORMAL HIGH (ref 6.5–8.1)

## 2022-05-01 LAB — ETHANOL: Alcohol, Ethyl (B): 43 mg/dL — ABNORMAL HIGH (ref <10)

## 2022-05-01 MED ORDER — METOCLOPRAMIDE HCL 5 MG/ML IJ SOLN
10.0000 mg | Freq: Once | INTRAMUSCULAR | Status: DC
  Filled 2022-05-01: qty 2

## 2022-05-01 MED ORDER — KETOROLAC TROMETHAMINE 30 MG/ML IJ SOLN
30.0000 mg | Freq: Once | INTRAMUSCULAR | Status: DC
  Filled 2022-05-01: qty 1

## 2022-05-01 MED ORDER — LORAZEPAM 1 MG PO TABS
2.0000 mg | ORAL_TABLET | Freq: Once | ORAL | Status: AC
  Administered 2022-05-01: 2 mg via ORAL
  Filled 2022-05-01: qty 2

## 2022-05-01 NOTE — ED Provider Notes (Signed)
[facility]-EMERGENCY DEPT [facility] Emergency Department Provider Note MRN:  [id number]  Arrival date & time: 05/01/22     Chief Complaint   Depression   History of Present Illness   William Powers is a 41 y.o. year-old male with a history of HIV, heart failure, polysubstance abuse presenting to the ED with chief complaint of depression.  Feeling really depressed and wants to kill himself.  Says he will do it anyway he can.  Having continued headaches over the past several days/weeks.  Review of Systems  A thorough review of systems was obtained and all systems are negative except as noted in the HPI and PMH.   Patient's Health History    Past Medical History:  Diagnosis Date   Heart failure (HCC)    HFrEF (heart failure with reduced ejection fraction) (HCC)    15% EF as of 2019   HIV (human immunodeficiency virus infection) (HCC)    Hypertension    Polysubstance abuse (HCC)    Pulmonary embolism (HCC)     Past Surgical History:  Procedure Laterality Date   HERNIA REPAIR      Family History  Problem Relation Age of Onset   Lupus Mother    HIV Mother    HIV Father     Social History   Socioeconomic History   Marital status: Divorced    Spouse name: Not on file   Number of children: Not on file   Years of education: Not on file   Highest education level: Not on file  Occupational History   Not on file  Tobacco Use   Smoking status: Some Days    Packs/day: 0.50    Types: Cigarettes, Cigars   Smokeless tobacco: Never  Vaping Use   Vaping Use: Never used  Substance and Sexual Activity   Alcohol use: Yes    Comment: 10 pints per week   Drug use: Yes    Types: Marijuana, Cocaine   Sexual activity: Not on file  Other Topics Concern   Not on file  Social History Narrative   Not on file   Social Determinants of Health   Financial Resource Strain: Not on file  Food Insecurity: Not on file  Transportation Needs: Not on file  Physical Activity: Not on  file  Stress: Not on file  Social Connections: Not on file  Intimate Partner Violence: Not on file     Physical Exam   Vitals:   05/01/22 0124 05/01/22 0257  BP: 118/77 124/67  Pulse: (!) 116 87  Resp: 20 16  Temp: 98.8 F (37.1 C)   SpO2: 97% 98%    CONSTITUTIONAL: Well-appearing, NAD NEURO/PSYCH:  Alert and oriented x 3, no focal deficits EYES:  eyes equal and reactive ENT/NECK:  no LAD, no JVD CARDIO: Regular rate, well-perfused, normal S1 and S2 PULM:  CTAB no wheezing or rhonchi GI/GU:  non-distended, non-tender MSK/SPINE:  No gross deformities, no edema SKIN:  no rash, atraumatic   *Additional and/or pertinent findings included in MDM below  Diagnostic and Interventional Summary    EKG Interpretation  Date/Time:    Ventricular Rate:    PR Interval:    QRS Duration:   QT Interval:    QTC Calculation:   R Axis:     Text Interpretation:         Labs Reviewed  RESP PANEL BY RT-PCR (FLU A&B, COVID) [id number]    CT HEAD WO CONTRAST ( )  Final Result  Medications  LORazepam (ATIVAN) tablet 2 mg (2 mg Oral Given 05/01/22 0250)     Procedures  /  Critical Care Procedures  ED Course and Medical Decision Making  Initial Impression and Ddx Depression with suicidal ideation, recently here and evaluated by psychiatry but at that time did not have suicidal ideation, will reconsult TTS.  Having these chronic headaches and a history of HIV, headaches seem to be worsening, will obtain screening CT head to exclude mass lesion.  Past medical/surgical history that increases complexity of ED encounter: HIV  Interpretation of Diagnostics I personally reviewed the CT head and my interpretation is as follows: No obvious bleeding or masses    Patient Reassessment and Ultimate Disposition/Management     Patient now medically cleared, awaiting TTS evaluation.  Signed out to default provider.  Patient management required discussion with the following services or  consulting groups:  Psychiatry/TTS  Complexity of Problems Addressed Acute illness or injury that poses threat of life of bodily function  Additional Data Reviewed and Analyzed Further history obtained from: None  Additional Factors Impacting ED Encounter Risk Consideration of hospitalization  Elmer Sow. Pilar Plate, MD [facility] Emergency Medicine [facility] [email or web address]  Final Clinical Impressions(s) / ED Diagnoses     ICD-10-CM   1. Depression, unspecified depression type  F32.A     2. Suicidal ideation  R45.851       ED Discharge Orders     None        Discharge Instructions Discussed with and Provided to Patient:   Discharge Instructions   None      Sabas Sous, MD 05/01/22 [phone]

## 2022-05-01 NOTE — ED Triage Notes (Signed)
Pt BIB EMS with c/o x 1 week of depression states he would like help. Denies SI/HI.

## 2022-05-01 NOTE — ED Provider Notes (Signed)
Patient presented with vague suicidal ideations without a definitive plan.  He has no prior history of suicide attempt in the past.  Did have a headache and prior provider ordered CT of the head which showed no acute process.  Plan was for psychiatric evaluation however when patient was told that he was can be moved to a hallway bed so we could use his room for other patients he became upset with this.  Patient denies any active suicide plan at this time.  Patient's neurological exam is stable.  Patient will receive Toradol and Reglan for his headache.  Low suspicion for serious etiology such as subarachnoid hemorrhage or meningitis.  Will give referral to the behavioral health urgent care center   Lorre Nick, MD 05/01/22 [phone]

## 2022-05-02 LAB — POCT URINE DRUG SCREEN - MANUAL ENTRY (I-SCREEN)
POC Amphetamine UR: NOT DETECTED
POC Buprenorphine (BUP): NOT DETECTED
POC Cocaine UR: POSITIVE — AB
POC Marijuana UR: POSITIVE — AB
POC Methadone UR: NOT DETECTED
POC Methamphetamine UR: NOT DETECTED
POC Morphine: NOT DETECTED
POC Oxazepam (BZO): POSITIVE — AB
POC Oxycodone UR: NOT DETECTED
POC Secobarbital (BAR): NOT DETECTED

## 2022-05-02 LAB — RESP PANEL BY RT-PCR (FLU A&B, COVID) ARPGX2
Influenza A by PCR: NEGATIVE
Influenza B by PCR: NEGATIVE
SARS Coronavirus 2 by RT PCR: NEGATIVE

## 2022-05-02 MED ORDER — ADULT MULTIVITAMIN W/MINERALS CH
1.0000 | ORAL_TABLET | Freq: Every day | ORAL | Status: DC
  Administered 2022-05-02: 1 via ORAL
  Filled 2022-05-02: qty 1

## 2022-05-02 MED ORDER — LORAZEPAM 1 MG PO TABS: 1.0000 mg | ORAL_TABLET | Freq: Four times a day (QID) | ORAL | Status: DC

## 2022-05-02 MED ORDER — ACETAMINOPHEN 325 MG PO TABS: 650.0000 mg | ORAL_TABLET | Freq: Four times a day (QID) | ORAL | Status: DC | PRN

## 2022-05-02 MED ORDER — MAGNESIUM HYDROXIDE 400 MG/5ML PO SUSP: 30.0000 mL | Freq: Every day | ORAL | Status: DC | PRN

## 2022-05-02 MED ORDER — BICTEGRAVIR-EMTRICITAB-TENOFOV 50-200-25 MG PO TABS: 1.0000 | ORAL_TABLET | Freq: Every day | ORAL | Status: DC

## 2022-05-02 MED ORDER — LORAZEPAM 1 MG PO TABS: 1.0000 mg | ORAL_TABLET | Freq: Two times a day (BID) | ORAL | Status: DC

## 2022-05-02 MED ORDER — ALUM & MAG HYDROXIDE-SIMETH 200-200-20 MG/5ML PO SUSP: 30.0000 mL | ORAL | Status: DC | PRN

## 2022-05-02 MED ORDER — THIAMINE HCL 100 MG PO TABS: 100.0000 mg | ORAL_TABLET | Freq: Every day | ORAL | Status: DC

## 2022-05-02 MED ORDER — LOPERAMIDE HCL 2 MG PO CAPS: 2.0000 mg | ORAL_CAPSULE | ORAL | Status: DC | PRN

## 2022-05-02 MED ORDER — LORAZEPAM 1 MG PO TABS: 1.0000 mg | ORAL_TABLET | Freq: Three times a day (TID) | ORAL | Status: DC

## 2022-05-02 MED ORDER — LORAZEPAM 1 MG PO TABS: 1.0000 mg | ORAL_TABLET | Freq: Four times a day (QID) | ORAL | Status: DC | PRN

## 2022-05-02 MED ORDER — LORAZEPAM 1 MG PO TABS: 1.0000 mg | ORAL_TABLET | Freq: Every day | ORAL | Status: DC

## 2022-05-02 MED ORDER — HYDROXYZINE HCL 25 MG PO TABS: 25.0000 mg | ORAL_TABLET | Freq: Four times a day (QID) | ORAL | Status: DC | PRN

## 2022-05-02 MED ORDER — ONDANSETRON 4 MG PO TBDP: 4.0000 mg | ORAL_TABLET | Freq: Four times a day (QID) | ORAL | Status: DC | PRN

## 2022-05-02 MED ORDER — THIAMINE HCL 100 MG/ML IJ SOLN: 100.0000 mg | Freq: Once | INTRAMUSCULAR | Status: DC

## 2022-05-02 NOTE — ED Notes (Signed)
I brought the patient William Powers back to unit he stressed in the assessment room he wanted something to eat, so when he got on unit I asked him if he wanted anything for sleep and that I had to give him the thiamin shot he said and quote while waving his hand"nawl I dont want no shots or medications I just want food" Leeann Must and NP [place] notified

## 2022-05-02 NOTE — ED Notes (Signed)
Pt presents pleasant , cooperative. He states '' I just need some resources and I can go. '' When Clinical research associate approached and asked how we could help. Pt very focused only on food. Requesting lunch meal for breakfast. Given chicken parmesean dinner. Patient refused am medications and denies any SI or HI or symptoms of withdrawal. States '' I don't take biktaryv'' when offering HIV meds. Patient appears in no acute distress. No signs of delusional thinking or any AH OR VH reported. When pt asked. Pt is safe, will con't to monitor. Providers notified of med refusals.

## 2022-05-02 NOTE — ED Triage Notes (Signed)
Pt presents to [facility] voluntarily, accompanied by [employer] due to worsening depression and suicidal thoughts. Pt reports being picked up on [facility] and he was encouraged to come to [facility]. Pt states " I'm just down real bad right now and I know if I stay out here I'm going to hurt myself". Pt was not specific in terms of his plan. He only stated" by any means necessary". Pt reports having had about a pint of vodka earlier tonight. Pt is casually dressed and cooperative during triage process. Pt denies HI, AVH.

## 2022-05-02 NOTE — ED Notes (Signed)
Patient received a discharge summary with follow up instructions for community resources. Patient denies SI, HI and AVH at time of discharge all belongings returned to patient.

## 2022-05-02 NOTE — ED Provider Notes (Signed)
[facility]/OBS ASAP Discharge Summary  Date and Time: 05/02/2022 10:14 AM  Name: William Powers  MRN:  [id number]   Discharge Diagnoses:  Final diagnoses:  Severe episode of recurrent major depressive disorder, without psychotic features (HCC)  Suicidal ideations  Alcohol use disorder, severe, dependence (HCC)  Homelessness   Subjective:   Pt assessed face to face by nurse practitioner.  Pt w/ reported hx of chronic depression, SI, and alcohol abuse.   Pt reports he presented to this facility yesterday due to chronic depression, suicidal ideation and alcohol abuse. Utox from 05/02/22 +BZO, +cocaine, +marijuana. He reports "I just needed to sleep it off." He reports presently he feels "much better". He reports euthymic mood. He denies feeling depressed, anxious, other mood disturbances. He denies SI/VI/HI, AVH, paranoia. He verbalizes readiness to discharge. He verbally contracts to safety.   Pt denies hx of NSSI, SA or inpatient psychiatric hospitalization. He denies family psychiatric history. He denies he is currently in counseling, receiving psychiatric medication management, or receiving substance use treatment. He reports he is not currently interested in these services. He is agreeable to having services provided in his discharge paperwork should he change his mind  Discussed w/ pt refusal of his medications while on unit. Pt states he will follow up outpatient on his own, including his HIV medication. Pt declining rx. Pt states he is receiving his medical services at [facility] in [place]. When asked when he last saw his provider, he reports it's been "years". Discussed the importance of maintaining regular health check-ups. Pt states he does have an appointment w/ [facility] in August which he will attend. Per chart review, pt has appointment on 06/02/22 w/ Dr. Katherine Mantle of Infectious Diseases at [facility]   He reports he is currently receiving disability. He reports housing  instability. He denies access to a firearm.  Reviewed w/ pt strict return precautions. Pt verbalized understanding and agrees he will call 911/EMS or go to the nearest emergency room should he experience worsening of condition.  Stay Summary:  Pt is a 41 y/o male presenting to [facility] on 05/02/22 as a transfer from [facility] w/ c/o of depression, SI, and alcohol abuse. On reassessment today, pt denies SI/VI/HI, AVH, paranoia. He verbalizes readiness to discharge and verbally contracts to safety. Pt discharged w/ understanding of strict return precautions.  Total Time spent with patient: 15 minutes  Past Psychiatric History: Pt w/ reported hx of depression, SI, and alcohol abuse. Past Medical History:  Past Medical History:  Diagnosis Date   Heart failure (HCC)    HFrEF (heart failure with reduced ejection fraction) (HCC)    15% EF as of 2019   HIV (human immunodeficiency virus infection) (HCC)    Hypertension    Polysubstance abuse (HCC)    Pulmonary embolism (HCC)     Past Surgical History:  Procedure Laterality Date   HERNIA REPAIR     Family History:  Family History  Problem Relation Age of Onset   Lupus Mother    HIV Mother    HIV Father    Family Psychiatric History: Pt denies family psychiatric hx Social History:  Social History   Substance and Sexual Activity  Alcohol Use Yes   Comment: 10 pints per week     Social History   Substance and Sexual Activity  Drug Use Yes   Types: Marijuana, Cocaine    Social History   Socioeconomic History   Marital status: Divorced    Spouse name: Not  on file   Number of children: Not on file   Years of education: Not on file   Highest education level: Not on file  Occupational History   Not on file  Tobacco Use   Smoking status: Some Days    Packs/day: 0.50    Types: Cigarettes, Cigars   Smokeless tobacco: Never  Vaping Use   Vaping Use: Never used  Substance and Sexual Activity   Alcohol use: Yes    Comment: 10 pints  per week   Drug use: Yes    Types: Marijuana, Cocaine   Sexual activity: Not on file  Other Topics Concern   Not on file  Social History Narrative   Not on file   Social Determinants of Health   Financial Resource Strain: Not on file  Food Insecurity: Not on file  Transportation Needs: Not on file  Physical Activity: Not on file  Stress: Not on file  Social Connections: Not on file   SDOH:  SDOH Screenings   Alcohol Screen: Not on file  Depression (PHQ2-9): Low Risk  (08/11/2021)   Depression (PHQ2-9)    PHQ-2 Score: 0  Financial Resource Strain: Not on file  Food Insecurity: Not on file  Housing: Not on file  Physical Activity: Not on file  Social Connections: Not on file  Stress: Not on file  Tobacco Use: High Risk (05/01/2022)   Patient History    Smoking Tobacco Use: Some Days    Smokeless Tobacco Use: Never    Passive Exposure: Not on file  Transportation Needs: Not on file    Tobacco Cessation:  N/A, patient does not currently use tobacco products  Current Medications:  Current Facility-Administered Medications  Medication Dose Route Frequency Provider Last Rate Last Admin   acetaminophen (TYLENOL) tablet 650 mg  650 mg Oral Q6H PRN Onuoha, Chinwendu V, NP       bictegravir-emtricitabine-tenofovir AF (BIKTARVY) 50-200-25 MG per tablet 1 tablet  1 tablet Oral Daily Onuoha, Chinwendu V, NP       hydrOXYzine (ATARAX) tablet 25 mg  25 mg Oral Q6H PRN Onuoha, Chinwendu V, NP       loperamide (IMODIUM) capsule 2-4 mg  2-4 mg Oral PRN Onuoha, Chinwendu V, NP       LORazepam (ATIVAN) tablet 1 mg  1 mg Oral Q6H PRN Onuoha, Chinwendu V, NP       LORazepam (ATIVAN) tablet 1 mg  1 mg Oral QID Onuoha, Chinwendu V, NP       Followed by   Derrill Memo ON 05/03/2022] LORazepam (ATIVAN) tablet 1 mg  1 mg Oral TID Onuoha, Chinwendu V, NP       Followed by   Derrill Memo ON 05/04/2022] LORazepam (ATIVAN) tablet 1 mg  1 mg Oral BID Onuoha, Chinwendu V, NP       Followed by   Derrill Memo ON  05/05/2022] LORazepam (ATIVAN) tablet 1 mg  1 mg Oral Daily Onuoha, Chinwendu V, NP       multivitamin with minerals tablet 1 tablet  1 tablet Oral Daily Onuoha, Chinwendu V, NP   1 tablet at 05/02/22 0945   ondansetron (ZOFRAN-ODT) disintegrating tablet 4 mg  4 mg Oral Q6H PRN Onuoha, Chinwendu V, NP       thiamine (B-1) injection 100 mg  100 mg Intramuscular Once Onuoha, Chinwendu V, NP       [START ON 05/03/2022] thiamine tablet 100 mg  100 mg Oral Daily Onuoha, Chinwendu V, NP  Current Outpatient Medications  Medication Sig Dispense Refill   BIKTARVY 50-200-25 MG TABS tablet Take 1 tablet by mouth daily. (Patient not taking: Reported on 03/01/2022) 30 tablet 0   metoprolol succinate (TOPROL-XL) 50 MG 24 hr tablet Take 1 tablet by mouth 2 times daily. (Patient not taking: Reported on 03/01/2022) 60 tablet 0    PTA Medications: (Not in a hospital admission)      08/11/2021    8:12 AM  Depression screen PHQ 2/9  Decreased Interest 0  Down, Depressed, Hopeless 0  PHQ - 2 Score 0    Flowsheet Row ED from 05/01/2022 in [facility] ED from 04/28/2022 in [facility] EMERGENCY DEPARTMENT ED from 04/25/2022 in [facility] EMERGENCY DEPARTMENT  C-SSRS RISK CATEGORY No Risk No Risk No Risk       Musculoskeletal  Strength & Muscle Tone: within normal limits Gait & Station: normal Patient leans: N/A  Psychiatric Specialty Exam  Presentation  General Appearance: Casual  Eye Contact:Good  Speech:Clear and Coherent; Normal Rate  Speech Volume:Normal  Handedness:Right   Mood and Affect  Mood:Euthymic  Affect:Congruent   Thought Process  Thought Processes:Coherent; Goal Directed; Linear  Descriptions of Associations:Intact  Orientation:Full (Time, Place and Person)  Thought Content:Logical  Diagnosis of Schizophrenia or Schizoaffective disorder in past: No    Hallucinations:Hallucinations: None  Ideas  of Reference:None  Suicidal Thoughts:Suicidal Thoughts: No  Homicidal Thoughts:Homicidal Thoughts: No   Sensorium  Memory:Immediate Good; Recent Good  Judgment:Fair  Insight:Fair   Executive Functions  Concentration:Good  Attention Span:Good  Recall:Good  Fund of Knowledge:Good  Language:Good   Psychomotor Activity  Psychomotor Activity:Psychomotor Activity: Normal   Assets  Assets:Communication Skills; Desire for Improvement; Financial Resources/Insurance   Sleep  Sleep:Sleep: Fair Number of Hours of Sleep: 7   Nutritional Assessment (For OBS and [facility]BC admissions only) Has the patient had a weight loss or gain of 10 pounds or more in the last 3 months?: No Has the patient had a decrease in food intake/or appetite?: No Does the patient have dental problems?: No Does the patient have eating habits or behaviors that may be indicators of an eating disorder including binging or inducing vomiting?: No Has the patient recently lost weight without trying?: 0 Has the patient been eating poorly because of a decreased appetite?: 0 Malnutrition Screening Tool Score: 0    Physical Exam  Physical Exam Cardiovascular:     Rate and Rhythm: Normal rate.  Pulmonary:     Effort: Pulmonary effort is normal.  Neurological:     Mental Status: He is alert and oriented to person, place, and time.  Psychiatric:        Attention and Perception: Attention and perception normal.        Mood and Affect: Mood and affect normal.        Speech: Speech normal.        Behavior: Behavior normal. Behavior is cooperative.        Thought Content: Thought content normal.        Cognition and Memory: Cognition and memory normal.    Review of Systems  Constitutional:  Negative for chills and fever.  Respiratory:  Negative for shortness of breath.   Cardiovascular:  Negative for chest pain and palpitations.  Gastrointestinal:  Negative for abdominal pain.  Neurological:  Negative for  dizziness and headaches.  Psychiatric/Behavioral: Negative.     Blood pressure (!) 140/97, pulse 87, temperature 98.3 F (36.8 C), temperature  source Oral, resp. rate 20, SpO2 99 %. There is no height or weight on file to calculate BMI.  Demographic Factors:  Male, Living alone, and Unemployed  Loss Factors: Financial problems/change in socioeconomic status  Historical Factors: NA  Risk Reduction Factors:   NA  Continued Clinical Symptoms:  Alcohol/Substance Abuse/Dependencies  Cognitive Features That Contribute To Risk:  None    Suicide Risk:  Minimal: No identifiable suicidal ideation.  Patients presenting with no risk factors but with morbid ruminations; may be classified as minimal risk based on the severity of the depressive symptoms  Plan Of Care/Follow-up recommendations:  Follow up with outpatient resources  Disposition:  Discharge  Tharon Aquas, NP 05/02/2022, 10:14 AM

## 2022-05-02 NOTE — ED Provider Notes (Signed)
[facility] Continuous Assessment Admission H&P  Date: 05/02/22 Patient Name: William Powers MRN: [id number] Chief Complaint: " I feel bad".  Chief Complaint  Patient presents with   suicidal ideations   Alcohol Problem      Diagnoses:  Final diagnoses:  Severe episode of recurrent major depressive disorder, without psychotic features (HCC)  Suicidal ideations  Alcohol use disorder, severe, dependence (HCC)    HPI: "William Powers is a 41 y.o. male with a past medical history that includes polysubstance abuse and prior suicidal ideations, presents for recurrent suicide thoughts as a transfer from [facility]. Pt has history of AUD and presents with BAC of 43. Pt reeks of alcohol.  During this evaluation, the Patient endorsed suicidal ideations for the past two weeks but denied having a plan. When asked again, pt reports he thought about drinking bleach, cutting himself or and doing "anything to end it".  Patient is known to the emergency department for frequent visits for various complaints including chronic recurrent suicidal ideations and alcohol abuse.  Pt denies HI/AVH.  Pt reports he has been feeling bad for about two weeks with a lot of depression. Pt reports his symptoms as  feeling helpless and hopeless because he doesn't have any family, homelessness and transitioning from place to place, poor sleep and poor appetite, lack of motivation and abusing alcohol and cocaine daily. Pt reports he felt like he will try to hurt himself if he doesn't get help.  Pt reports he does not see a mental health provider or PCP and does not take any medications.  Pt denied having any medical or psychiatric history during this evaluation, but chart review shows the patient has an extensive medical and psychiatric history/active problem list.   Pt reports he drinks liquor from Monday to Saturday approximately 2-3 pints/day since 2016 and also drinks beer on Sundays about 4 cans since 2016. Pt reports  he last consumed an unknown amount of alcohol before presenting to the [facility] 05/01/[facility].  Pt also reports chronic abuse since 2020,while stating " I use when available off/on. Pt denies other illicit drug use. Pt denies nicotine use. Pt reports he smokes weed off and on stating he doesn't measure how much he smokes. Pt reports his sleep and appetite as poor.  Pt reports "I need detox very bad, I need help".   Support and encouragement provided about on-going stressors. Pt provided with opportunity for questions.   On evaluation, patient is alert, oriented x 4, cooperative but evasive about about some questions. Speech is clear and coherent. Pt appears well casual and smelling of alcohol. Eye contact is fair. Mood is depressed and hopeless, affect is congruent with mood. Thought process is logical and thought content is coherent. Pt endorses SI/ with plan, denies HI/AVH. There is no indication that the patient is responding to internal stimuli. No delusions elicited during this assessment.    PHQ 2-9:   Flowsheet Row ED from 05/01/2022 in [facility] ED from 04/28/2022 in [facility] EMERGENC[facility] DEPARTMENT ED from 04/25/2022 in [facility] EMERGENCY DEPARTMENT  C-SSRS RISK CATEGORY No Risk No Risk No Risk        Total Time spent with patient: 20 minutes  Musculoskeletal  Strength & Muscle Tone: within normal limits Gait & Station: normal Patient leans: N/A  Psychiatric Specialty Exam  Presentation General Appearance: Casual  Eye Contact:Fair  Speech:Clear and Coherent  Speech Volume:Normal  Handedness:Right   Mood and  Affect  Mood:Depressed; Hopeless  Affect:Congruent   Thought Process  Thought Processes:Coherent  Descriptions of Associations:Intact  Orientation:Full (Time, Place and Person)  Thought Content:Logical  Diagnosis of Schizophrenia or Schizoaffective disorder in past: No    Hallucinations:Hallucinations: None  Ideas of Reference:None  Suicidal Thoughts:Suicidal Thoughts: Yes, Active SI Active Intent and/or Plan: With Plan; With Intent  Homicidal Thoughts:Homicidal Thoughts: No   Sensorium  Memory:Immediate Fair; Recent Fair  Judgment:Fair  Insight:Fair   Executive Functions  Concentration:Good  Attention Span:Good  Recall:Good  Fund of Knowledge:Good  Language:Good   Psychomotor Activity  Psychomotor Activity:Psychomotor Activity: Normal   Assets  Assets:Communication Skills; Desire for Improvement; Financial Resources/Insurance   Sleep  Sleep:Sleep: Poor   Nutritional Assessment (For OBS and FBC admissions only) Has the patient had a weight loss or gain of 10 pounds or more in the last 3 months?: No Has the patient had a decrease in food intake/or appetite?: No Does the patient have dental problems?: No Does the patient have eating habits or behaviors that may be indicators of an eating disorder including binging or inducing vomiting?: No Has the patient recently lost weight without trying?: 0 Has the patient been eating poorly because of a decreased appetite?: 0 Malnutrition Screening Tool Score: 0    Physical Exam Constitutional:      Appearance: He is not toxic-appearing or diaphoretic.  HENT:     Head: Normocephalic.     Right Ear: External ear normal.     Left Ear: External ear normal.     Nose: No congestion.  Eyes:     General:        Right eye: No discharge.        Left eye: No discharge.  Cardiovascular:     Rate and Rhythm: Tachycardia present.  Pulmonary:     Effort: No respiratory distress.  Chest:     Chest wall: No tenderness.  Neurological:     Mental Status: He is alert and oriented to person, place, and time.  Psychiatric:        Attention and Perception: Attention and perception normal.        Mood and Affect: Mood is depressed.        Speech: Speech normal.        Behavior: Behavior  normal.        Thought Content: Thought content is not paranoid or delusional. Thought content includes suicidal ideation. Thought content does not include homicidal ideation. Thought content includes suicidal plan. Thought content does not include homicidal plan.        Cognition and Memory: Cognition and memory normal.        Judgment: Judgment is impulsive and inappropriate.    Review of Systems  Constitutional:  Negative for chills, diaphoresis and fever.  HENT:  Negative for congestion.   Eyes:  Negative for discharge.  Respiratory:  Negative for cough, shortness of breath and wheezing.   Cardiovascular:  Negative for chest pain and palpitations.  Gastrointestinal:  Negative for diarrhea, nausea and vomiting.  Neurological:  Negative for dizziness, seizures, loss of consciousness, weakness and headaches.  Psychiatric/Behavioral:  Positive for depression, substance abuse and suicidal ideas. Negative for hallucinations. The patient is not nervous/anxious and does not have insomnia.     Blood pressure 118/78, pulse (!) 114, temperature 98.4 F (36.9 C), temperature source Oral, resp. rate 18, SpO2 97 %. There is no height or weight on file to calculate BMI.  Past Psychiatric History: See Chart review  Is the patient at risk to self? Yes  Has the patient been a risk to self in the past 6 months? Yes .    Has the patient been a risk to self within the distant past? Yes   Is the patient a risk to others? No   Has the patient been a risk to others in the past 6 months? No   Has the patient been a risk to others within the distant past? No   Past Medical History:  Past Medical History:  Diagnosis Date   Heart failure (HCC)    HFrEF (heart failure with reduced ejection fraction) (HCC)    15% EF as of 2019   HIV (human immunodeficiency virus infection) (HCC)    Hypertension    Polysubstance abuse (HCC)    Pulmonary embolism (HCC)     Past Surgical History:  Procedure Laterality  Date   HERNIA REPAIR      Family History:  Family History  Problem Relation Age of Onset   Lupus Mother    HIV Mother    HIV Father     Social History:  Social History   Socioeconomic History   Marital status: Divorced    Spouse name: Not on file   Number of children: Not on file   Years of education: Not on file   Highest education level: Not on file  Occupational History   Not on file  Tobacco Use   Smoking status: Some Days    Packs/day: 0.50    Types: Cigarettes, Cigars   Smokeless tobacco: Never  Vaping Use   Vaping Use: Never used  Substance and Sexual Activity   Alcohol use: Yes    Comment: 10 pints per week   Drug use: Yes    Types: Marijuana, Cocaine   Sexual activity: Not on file  Other Topics Concern   Not on file  Social History Narrative   Not on file   Social Determinants of Health   Financial Resource Strain: Not on file  Food Insecurity: Not on file  Transportation Needs: Not on file  Physical Activity: Not on file  Stress: Not on file  Social Connections: Not on file  Intimate Partner Violence: Not on file    SDOH:  SDOH Screenings   Alcohol Screen: Not on file  Depression (PHQ2-9): Low Risk  (08/11/2021)   Depression (PHQ2-9)    PHQ-2 Score: 0  Financial Resource Strain: Not on file  Food Insecurity: Not on file  Housing: Not on file  Physical Activity: Not on file  Social Connections: Not on file  Stress: Not on file  Tobacco Use: High Risk (05/01/2022)   Patient History    Smoking Tobacco Use: Some Days    Smokeless Tobacco Use: Never    Passive Exposure: Not on file  Transportation Needs: Not on file    Last Labs:  Admission on 05/01/2022, Discharged on 05/01/2022  Component Date Value Ref Range Status   SARS Coronavirus 2 by RT PCR 05/01/2022 NEGATIVE  NEGATIVE Final   Comment: (NOTE) SARS-CoV-2 target nucleic acids are NOT DETECTED.  The SARS-CoV-2 RNA is generally detectable in upper respiratory specimens during  the acute phase of infection. The lowest concentration of SARS-CoV-2 viral copies this assay can detect is 138 copies/mL. A negative result does not preclude SARS-Cov-2 infection and should not be used as the sole basis for treatment or other patient management decisions. A negative result may occur with  improper specimen collection/handling, submission of specimen  other than nasopharyngeal swab, presence of viral mutation(s) within the areas targeted by this assay, and inadequate number of viral copies(<138 copies/mL). A negative result must be combined with clinical observations, patient history, and epidemiological information. The expected result is Negative.  Fact Sheet for Patients:  BloggerCourse.com  Fact Sheet for Healthcare Providers:  SeriousBroker.it  This test is no                          t yet approved or cleared by the [place] FDA and  has been authorized for detection and/or diagnosis of SARS-CoV-2 by FDA under an Emergency Use Authorization (EUA). This EUA will remain  in effect (meaning this test can be used) for the duration of the COVID-19 declaration under Section 564(b)(1) of the Act, 21 U.S.C.section 360bbb-3(b)(1), unless the authorization is terminated  or revoked sooner.       Influenza A by PCR 05/01/2022 NEGATIVE  NEGATIVE Final   Influenza B by PCR 05/01/2022 NEGATIVE  NEGATIVE Final   Comment: (NOTE) The Xpert Xpress SARS-CoV-2/FLU/RSV plus assay is intended as an aid in the diagnosis of influenza from Nasopharyngeal swab specimens and should not be used as a sole basis for treatment. Nasal washings and aspirates are unacceptable for Xpert Xpress SARS-CoV-2/FLU/RSV testing.  Fact Sheet for Patients: BloggerCourse.com  Fact Sheet for Healthcare Providers: SeriousBroker.it  This test is not yet approved or cleared by the [place] FDA  and has been authorized for detection and/or diagnosis of SARS-CoV-2 by FDA under an Emergency Use Authorization (EUA). This EUA will remain in effect (meaning this test can be used) for the duration of the COVID-19 declaration under Section 564(b)(1) of the Act, 21 U.S.C. section 360bbb-3(b)(1), unless the authorization is terminated or revoked.  Performed at [facility], [address] [place], [place] [address]    WBC 05/01/2022 4.3  4.0 - 10.5 K/uL Final   RBC 05/01/2022 4.44  4.22 - 5.81 MIL/uL Final   Hemoglobin 05/01/2022 13.8  13.0 - 17.0 g/dL Final   HCT 95/18/8416 41.3  39.0 - 52.0 % Final   MCV 05/01/2022 93.0  80.0 - 100.0 fL Final   MCH 05/01/2022 31.1  26.0 - 34.0 pg Final   MCHC 05/01/2022 33.4  30.0 - 36.0 g/dL Final   RDW 60/63/0160 13.6  11.5 - 15.5 % Final   Platelets 05/01/2022 253  150 - 400 K/uL Final   nRBC 05/01/2022 0.0  0.0 - 0.2 % Final   Neutrophils Relative % 05/01/2022 63  % Final   Neutro Abs 05/01/2022 2.7  1.7 - 7.7 K/uL Final   Lymphocytes Relative 05/01/2022 25  % Final   Lymphs Abs 05/01/2022 1.1  0.7 - 4.0 K/uL Final   Monocytes Relative 05/01/2022 9  % Final   Monocytes Absolute 05/01/2022 0.4  0.1 - 1.0 K/uL Final   Eosinophils Relative 05/01/2022 1  % Final   Eosinophils Absolute 05/01/2022 0.0  0.0 - 0.5 K/uL Final   Basophils Relative 05/01/2022 1  % Final   Basophils Absolute 05/01/2022 0.0  0.0 - 0.1 K/uL Final   Immature Granulocytes 05/01/2022 1  % Final   Abs Immature Granulocytes 05/01/2022 0.03  0.00 - 0.07 K/uL Final   Performed at [facility], [address] [place], [place] [address]   Sodium 05/01/2022 140  135 - 145 mmol/L Final   Potassium 05/01/2022 3.7  3.5 - 5.1 mmol/L Final   Chloride  05/01/2022 108  98 - 111 mmol/L Final   CO2 05/01/2022 21 (L)  22 - 32 mmol/L Final   Glucose, Bld 05/01/2022 76  70 - 99 mg/dL Final   Glucose reference range applies only to samples taken after  fasting for at least 8 hours.   BUN 05/01/2022 14  6 - 20 mg/dL Final   Creatinine, Ser 05/01/2022 1.12  0.61 - 1.24 mg/dL Final   Calcium 22/29/7989 8.9  8.9 - 10.3 mg/dL Final   Total Protein 21/19/4174 8.2 (H)  6.5 - 8.1 g/dL Final   Albumin 06/02/4817 3.8  3.5 - 5.0 g/dL Final   AST 56/31/4970 30  15 - 41 U/L Final   ALT 05/01/2022 34  0 - 44 U/L Final   Alkaline Phosphatase 05/01/2022 53  38 - 126 U/L Final   Total Bilirubin 05/01/2022 0.4  0.3 - 1.2 mg/dL Final   GFR, Estimated 05/01/2022 >60  >60 mL/min Final   Comment: (NOTE) Calculated using the CKD-EPI Creatinine Equation (2021)    Anion gap 05/01/2022 11  5 - 15 Final   Performed at [facility], [address] [place], [place] [address]   Alcohol, Ethyl (B) 05/01/2022 43 (H)  <10 mg/dL Final   Comment: (NOTE) Lowest detectable limit for serum alcohol is 10 mg/dL.  For medical purposes only. Performed at [facility], [address], [place], [place] [address]   Admission on 04/28/2022, Discharged on 04/29/2022  Component Date Value Ref Range Status   Sodium 04/28/2022 139  135 - 145 mmol/L Final   Potassium 04/28/2022 3.3 (L)  3.5 - 5.1 mmol/L Final   Chloride 04/28/2022 108  98 - 111 mmol/L Final   CO2 04/28/2022 20 (L)  22 - 32 mmol/L Final   Glucose, Bld 04/28/2022 83  70 - 99 mg/dL Final   Glucose reference range applies only to samples taken after fasting for at least 8 hours.   BUN 04/28/2022 12  6 - 20 mg/dL Final   Creatinine, Ser 04/28/2022 1.08  0.61 - 1.24 mg/dL Final   Calcium 27/74/1287 8.8 (L)  8.9 - 10.3 mg/dL Final   Total Protein 86/76/7209 7.9  6.5 - 8.1 g/dL Final   Albumin 47/06/6282 3.7  3.5 - 5.0 g/dL Final   AST 66/29/4765 34  15 - 41 U/L Final   ALT 04/28/2022 42  0 - 44 U/L Final   Alkaline Phosphatase 04/28/2022 54  38 - 126 U/L Final   Total Bilirubin 04/28/2022 0.5  0.3 - 1.2 mg/dL Final   GFR, Estimated 04/28/2022 >60  >60 mL/min Final   Comment:  (NOTE) Calculated using the CKD-EPI Creatinine Equation (2021)    Anion gap 04/28/2022 11  5 - 15 Final   Performed at [facility], [address], [place], [place] [address]   Alcohol, Ethyl (B) 04/28/2022 201 (H)  <10 mg/dL Final   Comment: (NOTE) Lowest detectable limit for serum alcohol is 10 mg/dL.  For medical purposes only. Performed at [facility], [address], [place], [place] [address]    Salicylate Lvl 04/28/2022 <7.0 (L)  7.0 - 30.0 mg/dL Final   Performed at [facility], [address], [place], [place] [address]   Acetaminophen (Tylenol), Serum 04/28/2022 <10 (L)  10 - 30 ug/mL Final   Comment: (NOTE) Therapeutic concentrations vary significantly. A range of 10-30 ug/mL  may be an effective concentration for many patients. However, some  are  best treated at concentrations outside of this range. Acetaminophen concentrations >150 ug/mL at 4 hours after ingestion  and >50 ug/mL at 12 hours after ingestion are often associated with  toxic reactions.  Performed at [facility], [address], [place], [place] [address]    WBC 04/28/2022 5.0  4.0 - 10.5 K/uL Final   RBC 04/28/2022 4.33  4.22 - 5.81 MIL/uL Final   Hemoglobin 04/28/2022 13.4  13.0 - 17.0 g/dL Final   HCT 60/45/4098 40.3  39.0 - 52.0 % Final   MCV 04/28/2022 93.1  80.0 - 100.0 fL Final   MCH 04/28/2022 30.9  26.0 - 34.0 pg Final   MCHC 04/28/2022 33.3  30.0 - 36.0 g/dL Final   RDW 11/91/4782 13.4  11.5 - 15.5 % Final   Platelets 04/28/2022 238  150 - 400 K/uL Final   nRBC 04/28/2022 0.0  0.0 - 0.2 % Final   Performed at [facility], [address] [place], [place] [address]   Opiates 04/28/2022 NONE DETECTED  NONE DETECTED Final   Cocaine 04/28/2022 NONE DETECTED  NONE DETECTED Final   Benzodiazepines 04/28/2022 NONE DETECTED  NONE DETECTED Final   Amphetamines 04/28/2022 NONE DETECTED  NONE DETECTED Final   Tetrahydrocannabinol 04/28/2022 POSITIVE (A)  NONE  DETECTED Final   Barbiturates 04/28/2022 NONE DETECTED  NONE DETECTED Final   Comment: (NOTE) DRUG SCREEN FOR MEDICAL PURPOSES ONLY.  IF CONFIRMATION IS NEEDED FOR ANY PURPOSE, NOTIFY LAB WITHIN 5 DAYS.  LOWEST DETECTABLE LIMITS FOR URINE DRUG SCREEN Drug Class                     Cutoff (ng/mL) Amphetamine and metabolites    1000 Barbiturate and metabolites    200 Benzodiazepine                 200 Tricyclics and metabolites     300 Opiates and metabolites        300 Cocaine and metabolites        300 THC                            50 Performed at [facility], [address] [place], [place] [address]    Color, Urine 04/28/2022 YELLOW  YELLOW Final   APPearance 04/28/2022 CLEAR  CLEAR Final   Specific Gravity, Urine 04/28/2022 1.025  1.005 - 1.030 Final   pH 04/28/2022 5.0  5.0 - 8.0 Final   Glucose, UA 04/28/2022 NEGATIVE  NEGATIVE mg/dL Final   Hgb urine dipstick 04/28/2022 SMALL (A)  NEGATIVE Final   Bilirubin Urine 04/28/2022 NEGATIVE  NEGATIVE Final   Ketones, ur 04/28/2022 NEGATIVE  NEGATIVE mg/dL Final   Protein, ur 78/46/9629 NEGATIVE  NEGATIVE mg/dL Final   Nitrite 52/84/1324 NEGATIVE  NEGATIVE Final   Leukocytes,Ua 04/28/2022 NEGATIVE  NEGATIVE Final   RBC / HPF 04/28/2022 0-5  0 - 5 RBC/hpf Final   WBC, UA 04/28/2022 6-10  0 - 5 WBC/hpf Final   Bacteria, UA 04/28/2022 NONE SEEN  NONE SEEN Final   Squamous Epithelial / LPF 04/28/2022 0-5  0 - 5 Final   Mucus 04/28/2022 PRESENT   Final   Performed at [facility], [address], [place], [place] [address]   Troponin I (High Sensitivity) 04/28/2022 6  <18 ng/L Final   Comment: (NOTE) Elevated high sensitivity troponin I (hsTnI) values and significant  changes across serial  measurements may suggest ACS but many other  chronic and acute conditions are known to elevate hsTnI results.  Refer to the "Links" section for chest pain algorithms and additional  guidance. Performed at [facility],  [address], [place], [place] [address]   Admission on 04/25/2022, Discharged on 04/26/2022  Component Date Value Ref Range Status   Sodium 04/25/2022 137  135 - 145 mmol/L Final   Potassium 04/25/2022 3.2 (L)  3.5 - 5.1 mmol/L Final   Chloride 04/25/2022 107  98 - 111 mmol/L Final   CO2 04/25/2022 18 (L)  22 - 32 mmol/L Final   Glucose, Bld 04/25/2022 98  70 - 99 mg/dL Final   Glucose reference range applies only to samples taken after fasting for at least 8 hours.   BUN 04/25/2022 15  6 - 20 mg/dL Final   Creatinine, Ser 04/25/2022 1.09  0.61 - 1.24 mg/dL Final   Calcium 60/45/4098 8.8 (L)  8.9 - 10.3 mg/dL Final   GFR, Estimated 04/25/2022 >60  >60 mL/min Final   Comment: (NOTE) Calculated using the CKD-EPI Creatinine Equation (2021)    Anion gap 04/25/2022 12  5 - 15 Final   Performed at [facility], [address] [place], [place] [address]   WBC 04/25/2022 4.1  4.0 - 10.5 K/uL Final   RBC 04/25/2022 4.37  4.22 - 5.81 MIL/uL Final   Hemoglobin 04/25/2022 13.6  13.0 - 17.0 g/dL Final   HCT 78/29/5621 40.6  39.0 - 52.0 % Final   MCV 04/25/2022 92.9  80.0 - 100.0 fL Final   MCH 04/25/2022 31.1  26.0 - 34.0 pg Final   MCHC 04/25/2022 33.5  30.0 - 36.0 g/dL Final   RDW 30/86/5784 13.5  11.5 - 15.5 % Final   Platelets 04/25/2022 255  150 - 400 K/uL Final   nRBC 04/25/2022 0.0  0.0 - 0.2 % Final   Performed at [facility], [address], [place], [place] [address]   Troponin I (High Sensitivity) 04/25/2022 6  <18 ng/L Final   Comment: (NOTE) Elevated high sensitivity troponin I (hsTnI) values and significant  changes across serial measurements may suggest ACS but many other  chronic and acute conditions are known to elevate hsTnI results.  Refer to the "Links" section for chest pain algorithms and additional  guidance. Performed at [facility], [address] [place], [place] [address]    Troponin I (High Sensitivity) 04/26/2022 6  <18 ng/L Final    Comment: (NOTE) Elevated high sensitivity troponin I (hsTnI) values and significant  changes across serial measurements may suggest ACS but many other  chronic and acute conditions are known to elevate hsTnI results.  Refer to the "Links" section for chest pain algorithms and additional  guidance. Performed at [facility], [address], [place], [place] [address]   Admission on 03/24/2022, Discharged on 03/25/2022  Component Date Value Ref Range Status   Sodium 03/24/2022 140  135 - 145 mmol/L Final   Potassium 03/24/2022 3.8  3.5 - 5.1 mmol/L Final   Chloride 03/24/2022 108  98 - 111 mmol/L Final   CO2 03/24/2022 22  22 - 32 mmol/L Final   Glucose, Bld 03/24/2022 80  70 - 99 mg/dL Final   Glucose reference range applies only to samples taken after fasting for at least 8 hours.   BUN 03/24/2022 13  6 - 20 mg/dL Final   Creatinine, Ser 03/24/2022 1.13  0.61 - 1.24 mg/dL Final   Calcium 29/93/7169 9.0  8.9 - 10.3 mg/dL Final   Total Protein 67/89/3810 8.1  6.5 - 8.1 g/dL Final   Albumin 17/51/0258 3.6  3.5 - 5.0 g/dL Final   AST 52/77/8242 119 (H)  15 - 41 U/L Final   ALT 03/24/2022 70 (H)  0 - 44 U/L Final   Alkaline Phosphatase 03/24/2022 57  38 - 126 U/L Final   Total Bilirubin 03/24/2022 0.9  0.3 - 1.2 mg/dL Final   GFR, Estimated 03/24/2022 >60  >60 mL/min Final   Comment: (NOTE) Calculated using the CKD-EPI Creatinine Equation (2021)    Anion gap 03/24/2022 10  5 - 15 Final   Performed at [facility], [address] [place], [place] [address]   Alcohol, Ethyl (B) 03/24/2022 131 (H)  <10 mg/dL Final   Comment: (NOTE) Lowest detectable limit for serum alcohol is 10 mg/dL.  For medical purposes only. Performed at [facility], [address] [place], [place] [address]    Salicylate Lvl 03/24/2022 <7.0 (L)  7.0 - 30.0 mg/dL Final   Performed at [facility], [address] [place], [place] [address]   Acetaminophen (Tylenol), Serum 03/24/2022  <10 (L)  10 - 30 ug/mL Final   Comment: (NOTE) Therapeutic concentrations vary significantly. A range of 10-30 ug/mL  may be an effective concentration for many patients. However, some  are best treated at concentrations outside of this range. Acetaminophen concentrations >150 ug/mL at 4 hours after ingestion  and >50 ug/mL at 12 hours after ingestion are often associated with  toxic reactions.  Performed at [facility], [address], [place], [place] [address]    WBC 03/24/2022 3.8 (L)  4.0 - 10.5 K/uL Final   RBC 03/24/2022 4.67  4.22 - 5.81 MIL/uL Final   Hemoglobin 03/24/2022 14.4  13.0 - 17.0 g/dL Final   HCT 09/32/6712 44.6  39.0 - 52.0 % Final   MCV 03/24/2022 95.5  80.0 - 100.0 fL Final   MCH 03/24/2022 30.8  26.0 - 34.0 pg Final   MCHC 03/24/2022 32.3  30.0 - 36.0 g/dL Final   RDW 45/80/9983 14.8  11.5 - 15.5 % Final   Platelets 03/24/2022 275  150 - 400 K/uL Final   nRBC 03/24/2022 0.0  0.0 - 0.2 % Final   Performed at [facility], [address], [place], [place] [address]   Opiates 03/25/2022 NONE DETECTED  NONE DETECTED Final   Cocaine 03/25/2022 NONE DETECTED  NONE DETECTED Final   Benzodiazepines 03/25/2022 NONE DETECTED  NONE DETECTED Final   Amphetamines 03/25/2022 NONE DETECTED  NONE DETECTED Final   Tetrahydrocannabinol 03/25/2022 POSITIVE (A)  NONE DETECTED Final   Barbiturates 03/25/2022 NONE DETECTED  NONE DETECTED Final   Comment: (NOTE) DRUG SCREEN FOR MEDICAL PURPOSES ONLY.  IF CONFIRMATION IS NEEDED FOR ANY PURPOSE, NOTIFY LAB WITHIN 5 DAYS.  LOWEST DETECTABLE LIMITS FOR URINE DRUG SCREEN Drug Class                     Cutoff (ng/mL) Amphetamine and metabolites    1000 Barbiturate and metabolites    200 Benzodiazepine                 200 Tricyclics and metabolites     300 Opiates and metabolites        300 Cocaine and metabolites        300 THC  50 Performed at [facility], [address],  [place], [place] [address]   Admission on 03/02/2022, Discharged on 03/03/2022  Component Date Value Ref Range Status   Sodium 03/02/2022 137  135 - 145 mmol/L Final   Potassium 03/02/2022 3.3 (L)  3.5 - 5.1 mmol/L Final   Chloride 03/02/2022 105  98 - 111 mmol/L Final   CO2 03/02/2022 20 (L)  22 - 32 mmol/L Final   Glucose, Bld 03/02/2022 87  70 - 99 mg/dL Final   Glucose reference range applies only to samples taken after fasting for at least 8 hours.   BUN 03/02/2022 10  6 - 20 mg/dL Final   Creatinine, Ser 03/02/2022 0.94  0.61 - 1.24 mg/dL Final   Calcium 19/14/7829 9.1  8.9 - 10.3 mg/dL Final   Total Protein 56/21/3086 8.5 (H)  6.5 - 8.1 g/dL Final   Albumin 57/84/6962 3.8  3.5 - 5.0 g/dL Final   AST 95/28/4132 41  15 - 41 U/L Final   ALT 03/02/2022 38  0 - 44 U/L Final   Alkaline Phosphatase 03/02/2022 61  38 - 126 U/L Final   Total Bilirubin 03/02/2022 1.1  0.3 - 1.2 mg/dL Final   GFR, Estimated 03/02/2022 >60  >60 mL/min Final   Comment: (NOTE) Calculated using the CKD-EPI Creatinine Equation (2021)    Anion gap 03/02/2022 12  5 - 15 Final   Performed at [facility], [address] [place], [place] [address]   WBC 03/02/2022 4.9  4.0 - 10.5 K/uL Final   RBC 03/02/2022 4.47  4.22 - 5.81 MIL/uL Final   Hemoglobin 03/02/2022 14.4  13.0 - 17.0 g/dL Final   HCT 27/25/3664 42.8  39.0 - 52.0 % Final   MCV 03/02/2022 95.7  80.0 - 100.0 fL Final   MCH 03/02/2022 32.2  26.0 - 34.0 pg Final   MCHC 03/02/2022 33.6  30.0 - 36.0 g/dL Final   RDW 40/34/7425 15.8 (H)  11.5 - 15.5 % Final   Platelets 03/02/2022 310  150 - 400 K/uL Final   nRBC 03/02/2022 0.0  0.0 - 0.2 % Final   Neutrophils Relative % 03/02/2022 39  % Final   Neutro Abs 03/02/2022 1.9  1.7 - 7.7 K/uL Final   Lymphocytes Relative 03/02/2022 41  % Final   Lymphs Abs 03/02/2022 1.9  0.7 - 4.0 K/uL Final   Monocytes Relative 03/02/2022 12  % Final   Monocytes Absolute 03/02/2022 0.6  0.1 - 1.0 K/uL Final   Eosinophils  Relative 03/02/2022 7  % Final   Eosinophils Absolute 03/02/2022 0.4  0.0 - 0.5 K/uL Final   Basophils Relative 03/02/2022 1  % Final   Basophils Absolute 03/02/2022 0.0  0.0 - 0.1 K/uL Final   Immature Granulocytes 03/02/2022 0  % Final   Abs Immature Granulocytes 03/02/2022 0.01  0.00 - 0.07 K/uL Final   Performed at [facility], [address], [place], [place] [address]   Alcohol, Ethyl (B) 03/02/2022 90 (H)  <10 mg/dL Final   Comment: (NOTE) Lowest detectable limit for serum alcohol is 10 mg/dL.  For medical purposes only. Performed at [facility], [address] [place], [place] [address]    Acetaminophen (Tylenol), Serum 03/02/2022 <10 (L)  10 - 30 ug/mL Final   Comment: (NOTE) Therapeutic concentrations vary significantly. A range of 10-30 ug/mL  may be an effective concentration for many patients. However, some  are best treated at concentrations  outside of this range. Acetaminophen concentrations >150 ug/mL at 4 hours after ingestion  and >50 ug/mL at 12 hours after ingestion are often associated with  toxic reactions.  Performed at [facility], [address], [place], [place] [address]    Salicylate Lvl 03/02/2022 <7.0 (L)  7.0 - 30.0 mg/dL Final   Performed at [facility], [address], [place], [place] [address]  Admission on 03/01/2022, Discharged on 03/01/2022  Component Date Value Ref Range Status   Sodium 03/01/2022 139  135 - 145 mmol/L Final   Potassium 03/01/2022 3.9  3.5 - 5.1 mmol/L Final   Chloride 03/01/2022 106  98 - 111 mmol/L Final   CO2 03/01/2022 21 (L)  22 - 32 mmol/L Final   Glucose, Bld 03/01/2022 83  70 - 99 mg/dL Final   Glucose reference range applies only to samples taken after fasting for at least 8 hours.   BUN 03/01/2022 19  6 - 20 mg/dL Final   Creatinine, Ser 03/01/2022 1.01  0.61 - 1.24 mg/dL Final   Calcium 09/81/1914 8.9  8.9 - 10.3 mg/dL Final   Total Protein 78/29/5621 8.7 (H)  6.5 - 8.1 g/dL Final    Albumin 30/86/5784 3.9  3.5 - 5.0 g/dL Final   AST 69/62/9528 42 (H)  15 - 41 U/L Final   ALT 03/01/2022 36  0 - 44 U/L Final   Alkaline Phosphatase 03/01/2022 59  38 - 126 U/L Final   Total Bilirubin 03/01/2022 0.9  0.3 - 1.2 mg/dL Final   GFR, Estimated 03/01/2022 >60  >60 mL/min Final   Comment: (NOTE) Calculated using the CKD-EPI Creatinine Equation (2021)    Anion gap 03/01/2022 12  5 - 15 Final   Performed at [facility], [address], [place], [place] [address]   Alcohol, Ethyl (B) 03/01/2022 105 (H)  <10 mg/dL Final   Comment: (NOTE) Lowest detectable limit for serum alcohol is 10 mg/dL.  For medical purposes only. Performed at [facility], [address] [place], [place] [address]    Salicylate Lvl 03/01/2022 <7.0 (L)  7.0 - 30.0 mg/dL Final   Performed at [facility], [address], [place], [place] [address]   Acetaminophen (Tylenol), Serum 03/01/2022 <10 (L)  10 - 30 ug/mL Final   Comment: (NOTE) Therapeutic concentrations vary significantly. A range of 10-30 ug/mL  may be an effective concentration for many patients. However, some  are best treated at concentrations outside of this range. Acetaminophen concentrations >150 ug/mL at 4 hours after ingestion  and >50 ug/mL at 12 hours after ingestion are often associated with  toxic reactions.  Performed at [facility], [address] [place], [place] [address]    WBC 03/01/2022 5.3  4.0 - 10.5 K/uL Final   RBC 03/01/2022 4.46  4.22 - 5.81 MIL/uL Final   Hemoglobin 03/01/2022 14.2  13.0 - 17.0 g/dL Final   HCT 47/42/5956 42.3  39.0 - 52.0 % Final   MCV 03/01/2022 94.8  80.0 - 100.0 fL Final   MCH 03/01/2022 31.8  26.0 - 34.0 pg Final   MCHC 03/01/2022 33.6  30.0 - 36.0 g/dL Final   RDW 38/75/6433 15.9 (H)  11.5 - 15.5 % Final   Platelets 03/01/2022 311  150 - 400 K/uL Final   nRBC 03/01/2022 0.0  0.0 - 0.2 % Final   Performed at  [facility], [address] [place], [place]  [address]   Opiates 03/01/2022 NONE DETECTED  NONE DETECTED Final   Cocaine 03/01/2022 NONE DETECTED  NONE DETECTED Final   Benzodiazepines 03/01/2022 NONE DETECTED  NONE DETECTED Final   Amphetamines 03/01/2022 NONE DETECTED  NONE DETECTED Final   Tetrahydrocannabinol 03/01/2022 POSITIVE (A)  NONE DETECTED Final   Barbiturates 03/01/2022 NONE DETECTED  NONE DETECTED Final   Comment: (NOTE) DRUG SCREEN FOR MEDICAL PURPOSES ONLY.  IF CONFIRMATION IS NEEDED FOR ANY PURPOSE, NOTIFY LAB WITHIN 5 DAYS.  LOWEST DETECTABLE LIMITS FOR URINE DRUG SCREEN Drug Class                     Cutoff (ng/mL) Amphetamine and metabolites    1000 Barbiturate and metabolites    200 Benzodiazepine                 200 Tricyclics and metabolites     300 Opiates and metabolites        300 Cocaine and metabolites        300 THC                            50 Performed at [facility], [address] [place], [place] [address]   Admission on 02/27/2022, Discharged on 02/27/2022  Component Date Value Ref Range Status   SARS Coronavirus 2 by RT PCR 02/27/2022 NEGATIVE  NEGATIVE Final   Comment: (NOTE) SARS-CoV-2 target nucleic acids are NOT DETECTED.  The SARS-CoV-2 RNA is generally detectable in upper respiratory specimens during the acute phase of infection. The lowest concentration of SARS-CoV-2 viral copies this assay can detect is 138 copies/mL. A negative result does not preclude SARS-Cov-2 infection and should not be used as the sole basis for treatment or other patient management decisions. A negative result may occur with  improper specimen collection/handling, submission of specimen other than nasopharyngeal swab, presence of viral mutation(s) within the areas targeted by this assay, and inadequate number of viral copies(<138 copies/mL). A negative result must be combined with clinical observations, patient  history, and epidemiological information. The expected result is Negative.  Fact Sheet for Patients:  BloggerCourse.com  Fact Sheet for Healthcare Providers:  SeriousBroker.it  This test is no                          t yet approved or cleared by the [place] FDA and  has been authorized for detection and/or diagnosis of SARS-CoV-2 by FDA under an Emergency Use Authorization (EUA). This EUA will remain  in effect (meaning this test can be used) for the duration of the COVID-19 declaration under Section 564(b)(1) of the Act, 21 U.S.C.section 360bbb-3(b)(1), unless the authorization is terminated  or revoked sooner.       Influenza A by PCR 02/27/2022 NEGATIVE  NEGATIVE Final   Influenza B by PCR 02/27/2022 NEGATIVE  NEGATIVE Final   Comment: (NOTE) The Xpert Xpress SARS-CoV-2/FLU/RSV plus assay is intended as an aid in the diagnosis of influenza from Nasopharyngeal swab specimens and should not be used as a sole basis for treatment. Nasal washings and aspirates are unacceptable for Xpert Xpress SARS-CoV-2/FLU/RSV testing.  Fact Sheet for Patients: BloggerCourse.com  Fact Sheet for Healthcare Providers: SeriousBroker.it  This test is not yet approved or cleared by the [place] FDA and has been authorized for detection and/or diagnosis of SARS-CoV-2 by FDA under an Emergency Use Authorization (EUA). This EUA  will remain in effect (meaning this test can be used) for the duration of the COVID-19 declaration under Section 564(b)(1) of the Act, 21 U.S.C. section 360bbb-3(b)(1), unless the authorization is terminated or revoked.  Performed at [facility], [address] [place], [place] [address]    WBC 02/27/2022 4.6  4.0 - 10.5 K/uL Final   RBC 02/27/2022 4.44  4.22 - 5.81 MIL/uL Final   Hemoglobin 02/27/2022 14.2  13.0 - 17.0 g/dL Final   HCT 60/45/409805/08/2022 41.3   39.0 - 52.0 % Final   MCV 02/27/2022 93.0  80.0 - 100.0 fL Final   MCH 02/27/2022 32.0  26.0 - 34.0 pg Final   MCHC 02/27/2022 34.4  30.0 - 36.0 g/dL Final   RDW 11/91/478205/08/2022 15.7 (H)  11.5 - 15.5 % Final   Platelets 02/27/2022 293  150 - 400 K/uL Final   nRBC 02/27/2022 0.0  0.0 - 0.2 % Final   Neutrophils Relative % 02/27/2022 44  % Final   Neutro Abs 02/27/2022 2.0  1.7 - 7.7 K/uL Final   Lymphocytes Relative 02/27/2022 43  % Final   Lymphs Abs 02/27/2022 2.0  0.7 - 4.0 K/uL Final   Monocytes Relative 02/27/2022 8  % Final   Monocytes Absolute 02/27/2022 0.4  0.1 - 1.0 K/uL Final   Eosinophils Relative 02/27/2022 4  % Final   Eosinophils Absolute 02/27/2022 0.2  0.0 - 0.5 K/uL Final   Basophils Relative 02/27/2022 1  % Final   Basophils Absolute 02/27/2022 0.0  0.0 - 0.1 K/uL Final   Immature Granulocytes 02/27/2022 0  % Final   Abs Immature Granulocytes 02/27/2022 0.02  0.00 - 0.07 K/uL Final   Performed at [facility], [address] [place], [place] [address]   Sodium 02/27/2022 140  135 - 145 mmol/L Final   Potassium 02/27/2022 4.1  3.5 - 5.1 mmol/L Final   Chloride 02/27/2022 106  98 - 111 mmol/L Final   CO2 02/27/2022 28  22 - 32 mmol/L Final   Glucose, Bld 02/27/2022 91  70 - 99 mg/dL Final   Glucose reference range applies only to samples taken after fasting for at least 8 hours.   BUN 02/27/2022 6  6 - 20 mg/dL Final   Creatinine, Ser 02/27/2022 1.00  0.61 - 1.24 mg/dL Final   Calcium 30/86/578405/08/2022 9.0  8.9 - 10.3 mg/dL Final   Total Protein 69/62/952805/08/2022 7.9  6.5 - 8.1 g/dL Final   Albumin 41/32/440105/08/2022 3.7  3.5 - 5.0 g/dL Final   AST 02/72/536605/08/2022 36  15 - 41 U/L Final   ALT 02/27/2022 30  0 - 44 U/L Final   Alkaline Phosphatase 02/27/2022 63  38 - 126 U/L Final   Total Bilirubin 02/27/2022 0.8  0.3 - 1.2 mg/dL Final   GFR, Estimated 02/27/2022 >60  >60 mL/min Final   Comment: (NOTE) Calculated using the CKD-EPI Creatinine Equation (2021)    Anion gap 02/27/2022 6  5 -  15 Final   Performed at [facility], [address] [place], [place] [address]   Alcohol, Ethyl (B) 02/27/2022 104 (H)  <10 mg/dL Final   Comment: (NOTE) Lowest detectable limit for serum alcohol is 10 mg/dL.  For medical purposes only. Performed at [facility], [address] [place], [place] [address]    Magnesium 02/27/2022 2.2  1.7 - 2.4 mg/dL Final   Performed at [facility], [address] [place], [place] [address]  TSH 02/27/2022 2.170  0.350 - 4.500 uIU/mL Final   Comment: Performed by a 3rd Generation assay with a functional sensitivity of <=0.01 uIU/mL. Performed at [facility], [address] [place], [place] [address]    POC Amphetamine UR 02/27/2022 None Detected  NONE DETECTED (Cut Off Level 1000 ng/mL) Final   POC Secobarbital (BAR) 02/27/2022 None Detected  NONE DETECTED (Cut Off Level 300 ng/mL) Final   POC Buprenorphine (BUP) 02/27/2022 None Detected  NONE DETECTED (Cut Off Level 10 ng/mL) Final   POC Oxazepam (BZO) 02/27/2022 None Detected  NONE DETECTED (Cut Off Level 300 ng/mL) Final   POC Cocaine UR 02/27/2022 None Detected  NONE DETECTED (Cut Off Level 300 ng/mL) Final   POC Methamphetamine UR 02/27/2022 None Detected  NONE DETECTED (Cut Off Level 1000 ng/mL) Final   POC Morphine 02/27/2022 None Detected  NONE DETECTED (Cut Off Level 300 ng/mL) Final   POC Oxycodone UR 02/27/2022 None Detected  NONE DETECTED (Cut Off Level 100 ng/mL) Final   POC Methadone UR 02/27/2022 None Detected  NONE DETECTED (Cut Off Level 300 ng/mL) Final   POC Marijuana UR 02/27/2022 Positive (A)  NONE DETECTED (Cut Off Level 50 ng/mL) Final   SARSCOV2ONAVIRUS 2 AG 02/27/2022 NEGATIVE  NEGATIVE Final   Comment: (NOTE) SARS-CoV-2 antigen NOT DETECTED.   Negative results are presumptive.  Negative results do not preclude SARS-CoV-2 infection and should not be used as the sole basis for treatment or other patient management decisions, including infection   control decisions, particularly in the presence of clinical signs and  symptoms consistent with COVID-19, or in those who have been in contact with the virus.  Negative results must be combined with clinical observations, patient history, and epidemiological information. The expected result is Negative.  Fact Sheet for Patients: https://www.jennings-kim.com/  Fact Sheet for Healthcare Providers: https://alexander-rogers.biz/  This test is not yet approved or cleared by the [place] FDA and  has been authorized for detection and/or diagnosis of SARS-CoV-2 by FDA under an Emergency Use Authorization (EUA).  This EUA will remain in effect (meaning this test can be used) for the duration of  the COV                          ID-19 declaration under Section 564(b)(1) of the Act, 21 U.S.C. section 360bbb-3(b)(1), unless the authorization is terminated or revoked sooner.    Admission on 02/24/2022, Discharged on 02/25/2022  Component Date Value Ref Range Status   Sodium 02/24/2022 140  135 - 145 mmol/L Final   Potassium 02/24/2022 3.0 (L)  3.5 - 5.1 mmol/L Final   Chloride 02/24/2022 110  98 - 111 mmol/L Final   CO2 02/24/2022 20 (L)  22 - 32 mmol/L Final   Glucose, Bld 02/24/2022 87  70 - 99 mg/dL Final   Glucose reference range applies only to samples taken after fasting for at least 8 hours.   BUN 02/24/2022 13  6 - 20 mg/dL Final   Creatinine, Ser 02/24/2022 1.30 (H)  0.61 - 1.24 mg/dL Final   Calcium 60/45/4098 8.8 (L)  8.9 - 10.3 mg/dL Final   Total Protein 11/91/4782 8.5 (H)  6.5 - 8.1 g/dL Final   Albumin 95/62/1308 3.8  3.5 - 5.0 g/dL Final   AST 65/78/4696 37  15 - 41 U/L Final   ALT 02/24/2022 33  0 - 44 U/L Final   Alkaline Phosphatase 02/24/2022 60  38 - 126 U/L  Final   Total Bilirubin 02/24/2022 0.7  0.3 - 1.2 mg/dL Final   GFR, Estimated 02/24/2022 >60  >60 mL/min Final   Comment: (NOTE) Calculated using the CKD-EPI Creatinine Equation  (2021)    Anion gap 02/24/2022 10  5 - 15 Final   Performed at [facility], [address], [place], [place] [address]   Alcohol, Ethyl (B) 02/24/2022 277 (H)  <10 mg/dL Final   Comment: (NOTE) Lowest detectable limit for serum alcohol is 10 mg/dL.  For medical purposes only. Performed at [facility], [address], [place], [place] [address]    WBC 02/24/2022 5.9  4.0 - 10.5 K/uL Final   RBC 02/24/2022 4.44  4.22 - 5.81 MIL/uL Final   Hemoglobin 02/24/2022 14.3  13.0 - 17.0 g/dL Final   HCT 44/10/270 41.9  39.0 - 52.0 % Final   MCV 02/24/2022 94.4  80.0 - 100.0 fL Final   MCH 02/24/2022 32.2  26.0 - 34.0 pg Final   MCHC 02/24/2022 34.1  30.0 - 36.0 g/dL Final   RDW 53/66/4403 15.7 (H)  11.5 - 15.5 % Final   Platelets 02/24/2022 286  150 - 400 K/uL Final   nRBC 02/24/2022 0.0  0.0 - 0.2 % Final   Performed at [facility], [address] [place], [place] [address]   Opiates 02/24/2022 NONE DETECTED  NONE DETECTED Final   Cocaine 02/24/2022 NONE DETECTED  NONE DETECTED Final   Benzodiazepines 02/24/2022 NONE DETECTED  NONE DETECTED Final   Amphetamines 02/24/2022 NONE DETECTED  NONE DETECTED Final   Tetrahydrocannabinol 02/24/2022 POSITIVE (A)  NONE DETECTED Final   Barbiturates 02/24/2022 NONE DETECTED  NONE DETECTED Final   Comment: (NOTE) DRUG SCREEN FOR MEDICAL PURPOSES ONLY.  IF CONFIRMATION IS NEEDED FOR ANY PURPOSE, NOTIFY LAB WITHIN 5 DAYS.  LOWEST DETECTABLE LIMITS FOR URINE DRUG SCREEN Drug Class                     Cutoff (ng/mL) Amphetamine and metabolites    1000 Barbiturate and metabolites    200 Benzodiazepine                 200 Tricyclics and metabolites     300 Opiates and metabolites        300 Cocaine and metabolites        300 THC                            50 Performed at [facility], [address] [place] [address], [place], [place] [address]    SARS Coronavirus 2 by RT PCR  02/24/2022 NEGATIVE  NEGATIVE Final   Comment: (NOTE) SARS-CoV-2 target nucleic acids are NOT DETECTED.  The SARS-CoV-2 RNA is generally detectable in upper respiratory specimens during the acute phase of infection. The lowest concentration of SARS-CoV-2 viral copies this assay can detect is 138 copies/mL. A negative result does not preclude SARS-Cov-2 infection and should not be used as the sole basis for treatment or other patient management decisions. A negative result may occur with  improper specimen collection/handling, submission of specimen other than nasopharyngeal swab, presence of viral mutation(s) within the areas targeted by this assay, and inadequate number of viral copies(<138 copies/mL). A negative result must be combined with clinical observations, patient history, and epidemiological information. The expected result is Negative.  Fact Sheet for Patients:  BloggerCourse.com  Fact Sheet for Healthcare Providers:  SeriousBroker.it  This test is no                          t yet approved or cleared by the [place] and  has been authorized for detection and/or diagnosis of SARS-CoV-2 by FDA under an Emergency Use Authorization (EUA). This EUA will remain  in effect (meaning this test can be used) for the duration of the COVID-19 declaration under Section 564(b)(1) of the Act, 21 U.S.C.section 360bbb-3(b)(1), unless the authorization is terminated  or revoked sooner.       Influenza A by PCR 02/24/2022 NEGATIVE  NEGATIVE Final   Influenza B by PCR 02/24/2022 NEGATIVE  NEGATIVE Final   Comment: (NOTE) The Xpert Xpress SARS-CoV-2/FLU/RSV plus assay is intended as an aid in the diagnosis of influenza from Nasopharyngeal swab specimens and should not be used as a sole basis for treatment. Nasal washings and aspirates are unacceptable for Xpert Xpress SARS-CoV-2/FLU/RSV testing.  Fact Sheet for  Patients: BloggerCourse.com  Fact Sheet for Healthcare Providers: SeriousBroker.it  This test is not yet approved or cleared by the [place] FDA and has been authorized for detection and/or diagnosis of SARS-CoV-2 by FDA under an Emergency Use Authorization (EUA). This EUA will remain in effect (meaning this test can be used) for the duration of the COVID-19 declaration under Section 564(b)(1) of the Act, 21 U.S.C. section 360bbb-3(b)(1), unless the authorization is terminated or revoked.  Performed at [facility], [address] [place], [place] [address]    Salicylate Lvl 02/24/2022 <7.0 (L)  7.0 - 30.0 mg/dL Final   Performed at [facility], [address] [place], [place] [address]   Acetaminophen (Tylenol), Serum 02/24/2022 <10 (L)  10 - 30 ug/mL Final   Comment: (NOTE) Therapeutic concentrations vary significantly. A range of 10-30 ug/mL  may be an effective concentration for many patients. However, some  are best treated at concentrations outside of this range. Acetaminophen concentrations >150 ug/mL at 4 hours after ingestion  and >50 ug/mL at 12 hours after ingestion are often associated with  toxic reactions.  Performed at [facility], [address], [place], [place] [address]   Admission on 02/23/2022, Discharged on 02/24/2022  Component Date Value Ref Range Status   Sodium 02/23/2022 139  135 - 145 mmol/L Final   Potassium 02/23/2022 3.2 (L)  3.5 - 5.1 mmol/L Final   Chloride 02/23/2022 108  98 - 111 mmol/L Final   CO2 02/23/2022 23  22 - 32 mmol/L Final   Glucose, Bld 02/23/2022 105 (H)  70 - 99 mg/dL Final   Glucose reference range applies only to samples taken after fasting for at least 8 hours.   BUN 02/23/2022 12  6 - 20 mg/dL Final   Creatinine, Ser 02/23/2022 0.83  0.61 - 1.24 mg/dL Final   Calcium 84/13/2440 8.5 (L)  8.9 - 10.3 mg/dL Final    Total Protein 02/23/2022 8.0  6.5 - 8.1 g/dL Final   Albumin 08/16/2535 3.5  3.5 - 5.0 g/dL Final   AST 64/40/3474 38  15 - 41 U/L Final   ALT 02/23/2022 36  0 - 44 U/L Final   Alkaline Phosphatase 02/23/2022 60  38 - 126 U/L Final   Total Bilirubin 02/23/2022 0.7  0.3 - 1.2 mg/dL Final   GFR, Estimated 02/23/2022 >60  >60 mL/min Final   Comment: (NOTE) Calculated using the CKD-EPI Creatinine Equation (2021)  Anion gap 02/23/2022 8  5 - 15 Final   Performed at [facility], [address] [place], [place] [address]   Alcohol, Ethyl (B) 02/23/2022 240 (H)  <10 mg/dL Final   Comment: (NOTE) Lowest detectable limit for serum alcohol is 10 mg/dL.  For medical purposes only. Performed at [facility], [address], [place], [place] [address]    Opiates 02/23/2022 NONE DETECTED  NONE DETECTED Final   Cocaine 02/23/2022 NONE DETECTED  NONE DETECTED Final   Benzodiazepines 02/23/2022 NONE DETECTED  NONE DETECTED Final   Amphetamines 02/23/2022 NONE DETECTED  NONE DETECTED Final   Tetrahydrocannabinol 02/23/2022 POSITIVE (A)  NONE DETECTED Final   Barbiturates 02/23/2022 NONE DETECTED  NONE DETECTED Final   Comment: (NOTE) DRUG SCREEN FOR MEDICAL PURPOSES ONLY.  IF CONFIRMATION IS NEEDED FOR ANY PURPOSE, NOTIFY LAB WITHIN 5 DAYS.  LOWEST DETECTABLE LIMITS FOR URINE DRUG SCREEN Drug Class                     Cutoff (ng/mL) Amphetamine and metabolites    1000 Barbiturate and metabolites    200 Benzodiazepine                 200 Tricyclics and metabolites     300 Opiates and metabolites        300 Cocaine and metabolites        300 THC                            50 Performed at [facility], [address], [place], [place] [address]    WBC 02/23/2022 4.9  4.0 - 10.5 K/uL Final   RBC 02/23/2022 4.05 (L)  4.22 - 5.81 MIL/uL Final   Hemoglobin 02/23/2022 13.1  13.0 - 17.0 g/dL Final   HCT 91/47/8295 38.1 (L)  39.0 -  52.0 % Final   MCV 02/23/2022 94.1  80.0 - 100.0 fL Final   MCH 02/23/2022 32.3  26.0 - 34.0 pg Final   MCHC 02/23/2022 34.4  30.0 - 36.0 g/dL Final   RDW 62/13/0865 15.5  11.5 - 15.5 % Final   Platelets 02/23/2022 254  150 - 400 K/uL Final   nRBC 02/23/2022 0.0  0.0 - 0.2 % Final   Neutrophils Relative % 02/23/2022 37  % Final   Neutro Abs 02/23/2022 1.8  1.7 - 7.7 K/uL Final   Lymphocytes Relative 02/23/2022 46  % Final   Lymphs Abs 02/23/2022 2.2  0.7 - 4.0 K/uL Final   Monocytes Relative 02/23/2022 10  % Final   Monocytes Absolute 02/23/2022 0.5  0.1 - 1.0 K/uL Final   Eosinophils Relative 02/23/2022 6  % Final   Eosinophils Absolute 02/23/2022 0.3  0.0 - 0.5 K/uL Final   Basophils Relative 02/23/2022 1  % Final   Basophils Absolute 02/23/2022 0.0  0.0 - 0.1 K/uL Final   Immature Granulocytes 02/23/2022 0  % Final   Abs Immature Granulocytes 02/23/2022 0.02  0.00 - 0.07 K/uL Final   Performed at [facility], [address], [place], [place] [address]   Acetaminophen (Tylenol), Serum 02/23/2022 <10 (L)  10 - 30 ug/mL Final   Comment: (NOTE) Therapeutic concentrations vary significantly. A range of 10-30 ug/mL  may be an effective concentration for many patients. However, some  are best treated at concentrations outside of this range. Acetaminophen concentrations >150 ug/mL at 4 hours after  ingestion  and >50 ug/mL at 12 hours after ingestion are often associated with  toxic reactions.  Performed at [facility], [address], [address] [place], [place] [address]    Salicylate Lvl 02/23/2022 <7.0 (L)  7.0 - 30.0 mg/dL Final   Performed at [facility], [address], [place], [place] [address]  Admission on 02/09/2022, Discharged on 02/10/2022  Component Date Value Ref Range Status   WBC 02/10/2022 4.7  4.0 - 10.5 K/uL Final   RBC 02/10/2022 4.04 (L)  4.22 - 5.81 MIL/uL Final   Hemoglobin 02/10/2022 12.8 (L)  13.0 - 17.0 g/dL  Final   HCT 09/81/1914 38.1 (L)  39.0 - 52.0 % Final   MCV 02/10/2022 94.3  80.0 - 100.0 fL Final   MCH 02/10/2022 31.7  26.0 - 34.0 pg Final   MCHC 02/10/2022 33.6  30.0 - 36.0 g/dL Final   RDW 78/29/5621 14.2  11.5 - 15.5 % Final   Platelets 02/10/2022 290  150 - 400 K/uL Final   nRBC 02/10/2022 0.0  0.0 - 0.2 % Final   Neutrophils Relative % 02/10/2022 46  % Final   Neutro Abs 02/10/2022 2.1  1.7 - 7.7 K/uL Final   Lymphocytes Relative 02/10/2022 41  % Final   Lymphs Abs 02/10/2022 1.9  0.7 - 4.0 K/uL Final   Monocytes Relative 02/10/2022 9  % Final   Monocytes Absolute 02/10/2022 0.4  0.1 - 1.0 K/uL Final   Eosinophils Relative 02/10/2022 3  % Final   Eosinophils Absolute 02/10/2022 0.1  0.0 - 0.5 K/uL Final   Basophils Relative 02/10/2022 1  % Final   Basophils Absolute 02/10/2022 0.0  0.0 - 0.1 K/uL Final   Immature Granulocytes 02/10/2022 0  % Final   Abs Immature Granulocytes 02/10/2022 0.02  0.00 - 0.07 K/uL Final   Giant PLTs 02/10/2022 PRESENT   Final   Performed at [facility], [address] [place], [place] [address]   Sodium 02/10/2022 137  135 - 145 mmol/L Final   Potassium 02/10/2022 3.5  3.5 - 5.1 mmol/L Final   Chloride 02/10/2022 108  98 - 111 mmol/L Final   CO2 02/10/2022 20 (L)  22 - 32 mmol/L Final   Glucose, Bld 02/10/2022 100 (H)  70 - 99 mg/dL Final   Glucose reference range applies only to samples taken after fasting for at least 8 hours.   BUN 02/10/2022 11  6 - 20 mg/dL Final   Creatinine, Ser 02/10/2022 0.90  0.61 - 1.24 mg/dL Final   Calcium 78/46/9629 8.3 (L)  8.9 - 10.3 mg/dL Final   Total Protein 52/84/1324 7.9  6.5 - 8.1 g/dL Final   Albumin 40/07/2724 3.5  3.5 - 5.0 g/dL Final   AST 36/64/4034 37  15 - 41 U/L Final   ALT 02/10/2022 42  0 - 44 U/L Final   Alkaline Phosphatase 02/10/2022 59  38 - 126 U/L Final   Total Bilirubin 02/10/2022 0.6  0.3 - 1.2 mg/dL Final   GFR, Estimated 02/10/2022 >60  >60 mL/min Final   Comment:  (NOTE) Calculated using the CKD-EPI Creatinine Equation (2021)    Anion gap 02/10/2022 9  5 - 15 Final   Performed at [facility], [address], [place], [place] [address]   Alcohol, Ethyl (B) 02/10/2022 77 (H)  <10 mg/dL Final   Comment: (NOTE) Lowest detectable limit for serum alcohol is 10 mg/dL.  For medical purposes only. Performed at  [facility], [address], [place], [place] [address]    Opiates 02/10/2022 NONE DETECTED  NONE DETECTED Final   Cocaine 02/10/2022 POSITIVE (A)  NONE DETECTED Final   Benzodiazepines 02/10/2022 NONE DETECTED  NONE DETECTED Final   Amphetamines 02/10/2022 NONE DETECTED  NONE DETECTED Final   Tetrahydrocannabinol 02/10/2022 POSITIVE (A)  NONE DETECTED Final   Barbiturates 02/10/2022 NONE DETECTED  NONE DETECTED Final   Comment: (NOTE) DRUG SCREEN FOR MEDICAL PURPOSES ONLY.  IF CONFIRMATION IS NEEDED FOR ANY PURPOSE, NOTIFY LAB WITHIN 5 DAYS.  LOWEST DETECTABLE LIMITS FOR URINE DRUG SCREEN Drug Class                     Cutoff (ng/mL) Amphetamine and metabolites    1000 Barbiturate and metabolites    200 Benzodiazepine                 200 Tricyclics and metabolites     300 Opiates and metabolites        300 Cocaine and metabolites        300 THC                            50 Performed at [facility], [address], [place], [place] [address]    Salicylate Lvl 02/10/2022 <7.0 (L)  7.0 - 30.0 mg/dL Final   Performed at [facility], [address] [place], [place] [address]   Acetaminophen (Tylenol), Serum 02/10/2022 <10 (L)  10 - 30 ug/mL Final   Comment: (NOTE) Therapeutic concentrations vary significantly. A range of 10-30 ug/mL  may be an effective concentration for many patients. However, some  are best treated at concentrations outside of this range. Acetaminophen concentrations >150 ug/mL at 4 hours after ingestion  and >50 ug/mL at 12 hours after  ingestion are often associated with  toxic reactions.  Performed at [facility], [address] [place], [place] [address]    SARS Coronavirus 2 by RT PCR 02/10/2022 NEGATIVE  NEGATIVE Final   Comment: (NOTE) SARS-CoV-2 target nucleic acids are NOT DETECTED.  The SARS-CoV-2 RNA is generally detectable in upper respiratory specimens during the acute phase of infection. The lowest concentration of SARS-CoV-2 viral copies this assay can detect is 138 copies/mL. A negative result does not preclude SARS-Cov-2 infection and should not be used as the sole basis for treatment or other patient management decisions. A negative result may occur with  improper specimen collection/handling, submission of specimen other than nasopharyngeal swab, presence of viral mutation(s) within the areas targeted by this assay, and inadequate number of viral copies(<138 copies/mL). A negative result must be combined with clinical observations, patient history, and epidemiological information. The expected result is Negative.  Fact Sheet for Patients:  BloggerCourse.com  Fact Sheet for Healthcare Providers:  SeriousBroker.it  This test is no                          t yet approved or cleared by the [place] FDA and  has been authorized for detection and/or diagnosis of SARS-CoV-2 by FDA under an Emergency Use Authorization (EUA). This EUA will remain  in effect (meaning this test can be used) for the duration of the COVID-19 declaration under Section 564(b)(1) of the Act, 21 U.S.C.section 360bbb-3(b)(1), unless the authorization is terminated  or revoked sooner.  Influenza A by PCR 02/10/2022 NEGATIVE  NEGATIVE Final   Influenza B by PCR 02/10/2022 NEGATIVE  NEGATIVE Final   Comment: (NOTE) The Xpert Xpress SARS-CoV-2/FLU/RSV plus assay is intended as an aid in the diagnosis of influenza from Nasopharyngeal swab specimens  and should not be used as a sole basis for treatment. Nasal washings and aspirates are unacceptable for Xpert Xpress SARS-CoV-2/FLU/RSV testing.  Fact Sheet for Patients: BloggerCourse.com  Fact Sheet for Healthcare Providers: SeriousBroker.it  This test is not yet approved or cleared by the [place] FDA and has been authorized for detection and/or diagnosis of SARS-CoV-2 by FDA under an Emergency Use Authorization (EUA). This EUA will remain in effect (meaning this test can be used) for the duration of the COVID-19 declaration under Section 564(b)(1) of the Act, 21 U.S.C. section 360bbb-3(b)(1), unless the authorization is terminated or revoked.  Performed at [facility], [address] [place], [place] [address]   There may be more visits with results that are not included.    Allergies: Bee venom, Lisinopril, and Sulfamethoxazole-trimethoprim  PTA Medications: (Not in a hospital admission)  Prior to Admission medications   Medication Sig Start Date End Date Taking? Authorizing Provider  BIKTARVY 50-200-25 MG TABS tablet Take 1 tablet by mouth daily. Patient not taking: Reported on 03/01/2022 11/18/21   Melene Plan, DO  metoprolol succinate (TOPROL-XL) 50 MG 24 hr tablet Take 1 tablet by mouth 2 times daily. Patient not taking: Reported on 03/01/2022 11/18/21   Melene Plan, DO  Potassium Chloride ER 20 MEQ TBCR Take 20 mEq by mouth daily at 6 (six) AM. Patient not taking: No sig reported 08/07/21 09/05/21  Bobette Mo, MD    Medical Decision Making  Recommend admission to continuous observation unit for crisis stabilization and safety monitoring due to suicidal ideations with plan/intent. Pt may be appropriate for the [facility] for substance abuse/detox treatment.  Reviewed available lab results from [facility].  Labs orders placed this encounter UDS HA1c  Meds restarted this encounter   Bictegravir-emtricitabine-tenovir AF (BIKTARVY) 50-200-25 Mg 1 tablet PO daily for HIV  Recommend Initiate CIWA protocol -lorazepam 1 mg every 6 hours prn for CIWA >10 -thiamine 100 mg daily for nutritional supplementation -hydroxyzine 25 mg every 6 hours prn for anxiety, CIWA < or = 10 -ondansetron 4 mg ODT every 6 hours prn nausea/vomiting -loperamide 2-4 mg capsule prn diarrhea or loose stools  Recommendations  Based on my evaluation the patient does not appear to have an emergency medical condition.  Recommend admission to continuous observation unit. Monitor for SI. Re-eval in am Recommend restart home med for HIV  Mancel Bale, NP 05/02/22  1:33 AM

## 2022-05-02 NOTE — Discharge Instructions (Addendum)

## 2022-05-03 ENCOUNTER — Other Ambulatory Visit (HOSPITAL_COMMUNITY)
Admission: EM | Admit: 2022-05-03 | Discharge: 2022-05-04 | Disposition: A | Discharge status: Home / Self Care | Payer: Medicaid Other | Attending: Psychiatry | Admitting: Psychiatry

## 2022-05-03 ENCOUNTER — Encounter (HOSPITAL_COMMUNITY): Payer: Self-pay | Admitting: Urology

## 2022-05-03 DIAGNOSIS — Z20822 Contact with and (suspected) exposure to covid-19: Secondary | ICD-10-CM | POA: Diagnosis not present

## 2022-05-03 DIAGNOSIS — F10129 Alcohol abuse with intoxication, unspecified: Secondary | ICD-10-CM | POA: Diagnosis not present

## 2022-05-03 DIAGNOSIS — F1994 Other psychoactive substance use, unspecified with psychoactive substance-induced mood disorder: Secondary | ICD-10-CM | POA: Insufficient documentation

## 2022-05-03 DIAGNOSIS — F1910 Other psychoactive substance abuse, uncomplicated: Secondary | ICD-10-CM | POA: Diagnosis present

## 2022-05-03 LAB — RESP PANEL BY RT-PCR (FLU A&B, COVID) ARPGX2
Influenza A by PCR: NEGATIVE
Influenza B by PCR: NEGATIVE
SARS Coronavirus 2 by RT PCR: NEGATIVE

## 2022-05-03 LAB — POC SARS CORONAVIRUS 2 AG: SARSCOV2ONAVIRUS 2 AG: NEGATIVE

## 2022-05-03 LAB — ETHANOL: Alcohol, Ethyl (B): 146 mg/dL — ABNORMAL HIGH (ref <10)

## 2022-05-03 MED ORDER — LOPERAMIDE HCL 2 MG PO CAPS: 2.0000 mg | ORAL_CAPSULE | ORAL | Status: DC | PRN

## 2022-05-03 MED ORDER — ESCITALOPRAM OXALATE 10 MG PO TABS
10.0000 mg | ORAL_TABLET | Freq: Every day | ORAL | Status: DC
  Administered 2022-05-03: 10 mg via ORAL
  Filled 2022-05-03 (×2): qty 1

## 2022-05-03 MED ORDER — ALUM & MAG HYDROXIDE-SIMETH 200-200-20 MG/5ML PO SUSP: 30.0000 mL | ORAL | Status: DC | PRN

## 2022-05-03 MED ORDER — LORAZEPAM 1 MG PO TABS: 1.0000 mg | ORAL_TABLET | Freq: Four times a day (QID) | ORAL | Status: DC | PRN

## 2022-05-03 MED ORDER — TRAZODONE HCL 50 MG PO TABS
50.0000 mg | ORAL_TABLET | Freq: Every evening | ORAL | Status: DC | PRN
  Administered 2022-05-03 (×2): 50 mg via ORAL
  Filled 2022-05-03 (×2): qty 1

## 2022-05-03 MED ORDER — ADULT MULTIVITAMIN W/MINERALS CH
1.0000 | ORAL_TABLET | Freq: Every day | ORAL | Status: DC
  Administered 2022-05-03: 1 via ORAL
  Filled 2022-05-03 (×2): qty 1

## 2022-05-03 MED ORDER — HYDROXYZINE HCL 25 MG PO TABS
25.0000 mg | ORAL_TABLET | Freq: Four times a day (QID) | ORAL | Status: DC | PRN
  Administered 2022-05-03 (×2): 25 mg via ORAL
  Filled 2022-05-03 (×2): qty 1

## 2022-05-03 MED ORDER — THIAMINE HCL 100 MG PO TABS
100.0000 mg | ORAL_TABLET | Freq: Every day | ORAL | Status: DC
  Filled 2022-05-03: qty 1

## 2022-05-03 MED ORDER — MAGNESIUM HYDROXIDE 400 MG/5ML PO SUSP: 30.0000 mL | Freq: Every day | ORAL | Status: DC | PRN

## 2022-05-03 MED ORDER — ACETAMINOPHEN 325 MG PO TABS: 650.0000 mg | ORAL_TABLET | Freq: Four times a day (QID) | ORAL | Status: DC | PRN

## 2022-05-03 MED ORDER — ONDANSETRON 4 MG PO TBDP: 4.0000 mg | ORAL_TABLET | Freq: Four times a day (QID) | ORAL | Status: DC | PRN

## 2022-05-03 MED ORDER — THIAMINE HCL 100 MG/ML IJ SOLN: 100.0000 mg | Freq: Once | INTRAMUSCULAR | Status: DC

## 2022-05-03 NOTE — ED Notes (Signed)
Pt sleeping at present, no distress noted.  Monitoring for safety. 

## 2022-05-03 NOTE — ED Notes (Signed)
Patient admitted to the [facility] from the [facility].  Patient was seen and discharged from facility yesterday however returned later in the evening intoxicated and asking for admission which he was granted at that time.  Patient irritable and demanding.  He insisted on a hot breakfast with eggs and sausage.  When he was informed this was not available he accused staff of "holding out.'"  RN addressed him directly and clearly explained the food options that were available.  Patient then became more agreeable and refused breakfast.  He was cooperative with admission process and shown around unit and to his room.  Patient then went to sleep where he remains at present.  Patients ciwa= 0.  Patient encouraged to seek out staff to have needs met.  Will monitor and provide a safe environment.

## 2022-05-03 NOTE — ED Notes (Signed)
Pt. Attended AA 
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
## 2022-05-03 NOTE — ED Notes (Signed)
Pt came up to the desk asked for a towel and wash cloth  and extra pair of scrubs. I told him I would have to go find some and he got mad bc I didn't have his size in the pile we have on the unit and stormed off and went to his room complaining.

## 2022-05-03 NOTE — ED Notes (Signed)
Pending 2 Hr COVID and transfer to [facility]. 

## 2022-05-03 NOTE — ED Notes (Signed)
Pt escorted to [facility].  Although, he was complaining about not getting hot meal he said he would "be ok until lunch".  Pt was asked to sit on couch in [facility] and was compliant.

## 2022-05-03 NOTE — Discharge Instructions (Signed)
Substance Abuse Resources   [facility] - Admissions are currently completed Monday through Friday at 8am; both appointments and walk-ins are accepted.  Any individual that is a [facility] resident may present for a substance abuse screening and assessment for admission.  A person may be referred by numerous sources or self-refer.   Potential clients will be screened for medical necessity and appropriateness for the program.  Clients must meet criteria for high-intensity residential treatment services.  If clinically appropriate, a client will continue with the comprehensive clinical assessment and intake process, as well as enrollment in the [facility].  Address: [address] [place], [place] [address] Admin Hours: Mon-Fri 8AM to [facility] Center Hours: 24/7 Phone: [phone] Fax: [phone]  [facility] (Detox) Facility Based Crisis:  These are 3 locations for services for week and weekend: Please call before arrival:    [facility] Recovery Facility Based Crisis [facility])  Address: [address] [place], [place] [address] Phone: [phone]  [facility] Facility Based Crisis [facility]) Address: [address] [place], [place] [address] Phone#: [phone]  [facility] Recovery Facility Based Crisis [facility]) Address: [address] [place], [place] [address] Phone#: [phone]   [facility] ([facility]): (offers outpatient therapy and intensive outpatient substance abuse therapy).  [address] [place], [place] [address] Phone: [phone]   Phone: [phone]  [facility] Intensive Outpatient Program [facility]) means structured individual and group addiction activities and services that are provided at an outpatient program designed to assist adult and adolescent consumers to begin recovery and learn skills for recovery maintenance. The [employer] SAIOP program is offered at least 3 hours a  day, 3 days a week. SAIOP services shall include a structured program consisting of, but not limited to, the following services: Individual counseling and support; Group counseling and support; Family counseling, training or support; Biochemical assays to identify recent drug use (e.g., urine drug screens); Strategies for relapse prevention to include community and social support systems in treatment; Life skills; Crisis contingency planning; Disease Management; and Treatment support activities that have been adapted or specifically designed for persons with physical disabilities, or persons with co-occurring disorders of mental illness and substance abuse/dependence or mental retardation/developmental disability and substance abuse/dependence.  Phone: [phone]   [facility])  Address: [address] [place], [place], [place] [address] Phone: [phone]   [facility] Address: [address] [place], [place] [address] Phone: [phone]  - a combination of group and individual sessions to meet the participants needs. This allows participants to engage in treatment and remain involved in their home and work life. - Transitional housing places program participants in a supportive living environment while they complete a treatment program and work to secure independent housing. - The Substance Abuse Intensive Outpatient Treatment Program at [employer] consists of structured group sessions and individual sessions that are designed to teach participants early recovery and relapse prevention skills. -[facility] works with the [employer] to provide a housing and treatment program for homeless veterans.   Residential Company secretary, [employer].   Address: [address] [place], [place] [address] Phone#: [phone]   : Referrals to RTSA facilities can be made by [employer] and [facility].  Referrals are also  accepted from physicians, private providers, hospital emergency rooms, family members, or any person who has knowledge of someone in the need of our services.  [facility] will also offer the following outpatient services: (Monday through Friday 8am-5pm)   Partial Hospitalization Program (PHP) Substance Abuse Intensive Outpatient Program (SA-IOP) Group Therapy Medication Management Peer Living Room We also provide (24/7):  Assessments: Our mental health clinician and providers will conduct a focused mental health evaluation, assessing for immediate safety concerns and further mental health needs. Referral: Our team will provide resources and help connect to community based mental health treatment, when indicated, including psychotherapy, psychiatry, and other specialized behavioral health or substance use disorder services (for those not already in treatment). Transitional Care: Our team providers in person bridging and/or telephonic follow-up during the patient's transition to outpatient services.   [facility] 24-Hour Call Center: [phone] Behavioral Health Crisis Line: [phone]   Substance Abuse Treatment Programs  Intensive Outpatient Programs [facility]     [address]      [place], [place]                   [phone]       [facility] [address] [place] [address] [place], [place] [phone]  [facility]     (Inpatient and outpatient)     [address]           [phone]    [facility] [phone] (Suboxone and Methadone)  [address]      [place], [place] [address]      [phone]       [address] [place], [place] [phone]  [facility] (Outpatient/Inpatient, [employer])    (insurance only) [phone]             [facility] (Groups & Residential) [place], [place] [phone]     [facility]     [address]     [place], [place]      [phone]       [facility] (for caregivers and family) [phone] [address] [place], [place] [phone]      Residential Treatment Programs [facility]      [address], [place], [place] [address]  [phone]       [facility] [address] [place], [place] [address] [phone]  [facility] [place]        [phone]       [facility] [phone]  [facility] ([facility]             [address]                                         [place], [place]                                                [phone] or [phone]                               [facility] [address] [place], [address] [phone]  [facility]    [address]      [place], [place]     [phone]       [facility]  [address] [place], [place] [phone]  [facility]   [address] [place], [place] [address]     [phone]      Admissions: 8am-3pm M-F  [facility] ([facility]) [address] [place], [place] [phone]  [facility]: Residential Program [phone] Days)   [place], [place]      [phone] or [phone]     [facility]: [facility] [place], [place] (Walk in Hours over the weekend or by referral)  [facility] [address] [place], [place], [place] [address] [phone]  Crisis Mobile: [facility]:  [phone] (for crisis response 24 hours a day) [facility] Hotline:      [phone] Outpatient Psychiatry and Counseling  [facility]: Mobile Crisis Management 24 hours:  [phone]  [facility] of the [employer] sliding scale fee and walk in schedule: M-F 8am-12pm/1pm-3pm [address]  [place], [place] [address] [phone]  [facility] [address] [place], [place]  [address] [phone]  [facility] (Formerly known as [facility] [employer])- new patient walk-in appointments available Monday - Friday 8am -3pm.          [address] [place], [place] [address] [phone] or crisis line- [phone]  [facility]/ Intensive Outpatient Therapy Program [address] [place], [place] [address] [phone]  [facility] Mental Health                  Crisis Services      [phone] [address]     [place], [place] [address]                 [facility]   [facility] [phone]. [address] [place], [place] [address]   [employer] [facility]          [address] [place]  [place], [place] [address]       [phone]  [facility] [address] [place], [place] [address] [phone]  [facility]    [address]    [place], [place] [address]     [phone]       Andee Poles, MD     [address]     [place] [address]     [phone]       [facility] [address] [place] [address]  [facility]     [address] [place], [place]      [phone]       [facility] Eulogio Ditch, MD [address] [place], [place] [address] [phone]  [facility]     [address]     [place], [place] [address]     [phone]       [facility] [address] [place], [place] [address] [phone] Resources for Temporary Residential Assistance/Crisis Centers  [facility]) M-F 8am-3pm   [address] [place], [place] [address]   [phone] Services include: laundry, barbering, support groups, case management, phone  & computer access, showers, AA/NA mtgs, mental health/substance abuse nurse, job skills class,  disability information, VA assistance, spiritual classes, etc.   HOMELESS SHELTERS  Health and safety inspector     [facility]   [address]  [place]     [phone]              [facility] (women and children)       [address] [place], [place] [address] [phone] [email or web address] for application and process Application Required  [facility]   [address]    [place] [address]     [phone]                    [facility] [place] [address] [place], [place] [address] [phone] application appt.) Application Required  [facility] (women only)    [address]     [place], [place] [address]     [phone]      Intake starts 6pm daily Need valid ID, SSC, & Police report [employer] [address] [place] [address] [place], [place] [phone] Application Required  [employer] (men only)     [address]      [place], [place]     [phone]       [facility] [place] (Pregnant women only) [address] [place], [place] [phone]  [facility]      [address]      [place], [place] [address]     [phone]             [facility] [address] [place], [place] [phone] 90 day commitment/SA/Application process  [facility](men only)     [address]     [place], [place]     [phone]       Check-in at [facility] of [facility] [address] [place], [place] [address] [phone] Men/Women/Women and Children must be there by 7 pm  [facility] [place], [place] [phone]

## 2022-05-03 NOTE — ED Provider Notes (Signed)
Facility Based Crisis Admission H&P  Date: 05/03/22 Patient Name: William Powers MRN: [id number] Chief Complaint:  Chief Complaint  Patient presents with   Alcohol Intoxication      Diagnoses:  Final diagnoses:  Substance induced mood disorder (HCC)  Alcohol abuse with intoxication (HCC)    HPI: William Powers is a 41 year old male with psychiatric history of polysubstance abuse (alcohol, cocaine, marijuana), depression, and suicidal ideation.  Patient was brought to [facility] by law enforcement.  Patient is requesting assistance with alcohol detox and substance abuse treatment.   Patient was discharges from [facility] on 05/02/22. Patient has been evaluated in the EDs approximately 22 times in the past 6 months.   Patient was seen face-to-face and his chart was reviewed by this nurse practitioner.  On assessment, patient is alert and oriented x 4, he appears intoxicated and smells of alcohol; he is observed singing loudly in assessment room.  His speech is clear and coherent; patient has good eye contact.  His thought process is coherent.   Patient reports "they told me if I go and get really fucked up, I can come back and be admitted." He reports "I have a problem with alcohol. I've been drinking since I can remember. All the men in my family have alcohol problem. I need to get help." He says he was he was attempting to commit suicide by hitting his head on the wall and a police officer saw him and convinced him to return to [facility] mental health assistance.  Patient initially reported " I rammed my head against the wall" but when offered transfer to ED for medical clearance and imaging to rule out head injuries he states "I was lightly tapping my head on the wall." He denies headache, lightheadedness, visual changes, dizziness, nausea, vomiting, unsteady gait or any acute medical complaint.   Patient also reports experiencing increased anxiety over the past several months due to his  health. He says he has been experiencing difficulty urinating and incomplete bladder emptying. He says he strains when voiding, he is unable to void in a standing position, and has bowel leakage when voiding. He says he has an upcoming appointment with a urologist and that he is nervous about receiving unfavorable diagnosis.   Patient reports that he is having passive suicidal ideation and worsening depressive symptoms due to ongoing struggle with substance abuse. He reports that he drinks "large amount of liquor Monday thru Saturday" and drinks beers on Sunday because "liquor stores are closed on Sundays." He endorses depressive symptoms of hopelessness, worthlessness, isolation, irritability, anxiety, poor sleep and poor focus. He endorses passive suicidal ideations; he denies suicidal plans/intent to harm himself. He denies homicidal ideation, paranoia, and hallucination. He admits to using cocaine twice per month and smoking marijuana "on and off". He denies history of alcohol withdrawal seizures or DTs. He says he would like to be discharged to a residential substance abuse treatment program.   PHQ 2-9:   Flowsheet Row ED from 05/03/2022 in [facility] ED from 05/01/2022 in [facility] ED from 04/28/2022 in [facility] EMERGENCY DEPARTMENT  C-SSRS RISK CATEGORY High Risk No Risk No Risk        Total Time spent with patient: 20 minutes  Musculoskeletal  Strength & Muscle Tone: within normal limits Gait & Station: normal Patient leans: Right  Psychiatric Specialty Exam  Presentation General Appearance: Casual  Eye Contact:Good  Speech:Clear and Coherent; Normal Rate  Speech Volume:Normal  Handedness:Right   Mood and Affect  Mood:Depressed; Anxious  Affect:Congruent   Thought Process  Thought Processes:Coherent  Descriptions of Associations:Intact  Orientation:Full (Time, Place and  Person)  Thought Content:Logical  Diagnosis of Schizophrenia or Schizoaffective disorder in past: No   Hallucinations:Hallucinations: None  Ideas of Reference:None  Suicidal Thoughts:Suicidal Thoughts: Yes, Passive SI Active Intent and/or Plan: With Plan; With Intent SI Passive Intent and/or Plan: With Intent; Without Plan; Without Means to Carry Out  Homicidal Thoughts:Homicidal Thoughts: No   Sensorium  Memory:Immediate Good; Recent Good; Remote Good  Judgment:Fair  Insight:Fair   Executive Functions  Concentration:Good  Attention Span:Good  Recall:Good  Fund of Knowledge:Good  Language:Good   Psychomotor Activity  Psychomotor Activity:Psychomotor Activity: Normal   Assets  Assets:Communication Skills; Desire for Improvement; Physical Health; Resilience; Social Support   Sleep  Sleep:Sleep: Fair Number of Hours of Sleep: 7   Nutritional Assessment (For OBS and FBC admissions only) Has the patient had a weight loss or gain of 10 pounds or more in the last 3 months?: No Has the patient had a decrease in food intake/or appetite?: No Does the patient have dental problems?: No Does the patient have eating habits or behaviors that may be indicators of an eating disorder including binging or inducing vomiting?: No Has the patient recently lost weight without trying?: 0 Has the patient been eating poorly because of a decreased appetite?: 0 Malnutrition Screening Tool Score: 0    Physical Exam Vitals and nursing note reviewed.  Constitutional:      General: He is not in acute distress.    Appearance: He is well-developed. He is not ill-appearing, toxic-appearing or diaphoretic.  HENT:     Head: Normocephalic and atraumatic.  Eyes:     Conjunctiva/sclera: Conjunctivae normal.  Cardiovascular:     Rate and Rhythm: Normal rate and regular rhythm.     Heart sounds: No murmur heard. Pulmonary:     Effort: Pulmonary effort is normal. No respiratory  distress.     Breath sounds: Normal breath sounds.  Abdominal:     Palpations: Abdomen is soft.     Tenderness: There is no abdominal tenderness.  Musculoskeletal:        General: No swelling. Normal range of motion.     Cervical back: Neck supple.  Skin:    General: Skin is warm and dry.     Capillary Refill: Capillary refill takes less than 2 seconds.  Neurological:     Mental Status: He is alert and oriented to person, place, and time.  Psychiatric:        Attention and Perception: Attention and perception normal.        Mood and Affect: Mood is depressed.        Speech: Speech normal.        Behavior: Behavior normal. Behavior is cooperative.        Thought Content: Thought content normal. Thought content is not paranoid or delusional. Thought content does not include homicidal or suicidal ideation. Thought content does not include homicidal or suicidal plan.        Cognition and Memory: Cognition normal.    Review of Systems  Constitutional: Negative.   HENT: Negative.    Eyes: Negative.   Respiratory: Negative.    Cardiovascular: Negative.   Gastrointestinal: Negative.   Genitourinary: Negative.   Musculoskeletal: Negative.   Skin: Negative.   Neurological: Negative.   Endo/Heme/Allergies: Negative.   Psychiatric/Behavioral:  Positive for depression and substance abuse.  The patient is nervous/anxious.     Blood pressure 107/73, pulse 63, temperature 98.1 F (36.7 C), temperature source Oral, resp. rate 18, SpO2 100 %. There is no height or weight on file to calculate BMI.  Past Psychiatric History:    Is the patient at risk to self? No  Has the patient been a risk to self in the past 6 months? Yes .    Has the patient been a risk to self within the distant past? Yes   Is the patient a risk to others? No   Has the patient been a risk to others in the past 6 months? No   Has the patient been a risk to others within the distant past? No   Past Medical History:   Past Medical History:  Diagnosis Date   Heart failure (HCC)    HFrEF (heart failure with reduced ejection fraction) (HCC)    15% EF as of 2019   HIV (human immunodeficiency virus infection) (HCC)    Hypertension    Polysubstance abuse (HCC)    Pulmonary embolism (HCC)     Past Surgical History:  Procedure Laterality Date   HERNIA REPAIR      Family History:  Family History  Problem Relation Age of Onset   Lupus Mother    HIV Mother    HIV Father     Social History:  Social History   Socioeconomic History   Marital status: Divorced    Spouse name: Not on file   Number of children: Not on file   Years of education: Not on file   Highest education level: Not on file  Occupational History   Not on file  Tobacco Use   Smoking status: Some Days    Packs/day: 0.50    Types: Cigarettes, Cigars   Smokeless tobacco: Never  Vaping Use   Vaping Use: Never used  Substance and Sexual Activity   Alcohol use: Yes    Comment: 10 pints per week   Drug use: Yes    Types: Marijuana, Cocaine   Sexual activity: Not on file  Other Topics Concern   Not on file  Social History Narrative   Not on file   Social Determinants of Health   Financial Resource Strain: Not on file  Food Insecurity: Not on file  Transportation Needs: Not on file  Physical Activity: Not on file  Stress: Not on file  Social Connections: Not on file  Intimate Partner Violence: Not on file    SDOH:  SDOH Screenings   Alcohol Screen: Not on file  Depression (PHQ2-9): Low Risk  (08/11/2021)   Depression (PHQ2-9)    PHQ-2 Score: 0  Financial Resource Strain: Not on file  Food Insecurity: Not on file  Housing: Not on file  Physical Activity: Not on file  Social Connections: Not on file  Stress: Not on file  Tobacco Use: High Risk (05/03/2022)   Patient History    Smoking Tobacco Use: Some Days    Smokeless Tobacco Use: Never    Passive Exposure: Not on file  Transportation Needs: Not on file     Last Labs:  Admission on 05/03/2022  Component Date Value Ref Range Status   SARS Coronavirus 2 by RT PCR 05/03/2022 NEGATIVE  NEGATIVE Final   Comment: (NOTE) SARS-CoV-2 target nucleic acids are NOT DETECTED.  The SARS-CoV-2 RNA is generally detectable in upper respiratory specimens during the acute phase of infection. The lowest concentration of SARS-CoV-2 viral copies this assay  can detect is 138 copies/mL. A negative result does not preclude SARS-Cov-2 infection and should not be used as the sole basis for treatment or other patient management decisions. A negative result may occur with  improper specimen collection/handling, submission of specimen other than nasopharyngeal swab, presence of viral mutation(s) within the areas targeted by this assay, and inadequate number of viral copies(<138 copies/mL). A negative result must be combined with clinical observations, patient history, and epidemiological information. The expected result is Negative.  Fact Sheet for Patients:  BloggerCourse.com  Fact Sheet for Healthcare Providers:  SeriousBroker.it  This test is no                          t yet approved or cleared by the [place] FDA and  has been authorized for detection and/or diagnosis of SARS-CoV-2 by FDA under an Emergency Use Authorization (EUA). This EUA will remain  in effect (meaning this test can be used) for the duration of the COVID-19 declaration under Section 564(b)(1) of the Act, 21 U.S.C.section 360bbb-3(b)(1), unless the authorization is terminated  or revoked sooner.       Influenza A by PCR 05/03/2022 NEGATIVE  NEGATIVE Final   Influenza B by PCR 05/03/2022 NEGATIVE  NEGATIVE Final   Comment: (NOTE) The Xpert Xpress SARS-CoV-2/FLU/RSV plus assay is intended as an aid in the diagnosis of influenza from Nasopharyngeal swab specimens and should not be used as a sole basis for treatment. Nasal  washings and aspirates are unacceptable for Xpert Xpress SARS-CoV-2/FLU/RSV testing.  Fact Sheet for Patients: BloggerCourse.com  Fact Sheet for Healthcare Providers: SeriousBroker.it  This test is not yet approved or cleared by the [place] FDA and has been authorized for detection and/or diagnosis of SARS-CoV-2 by FDA under an Emergency Use Authorization (EUA). This EUA will remain in effect (meaning this test can be used) for the duration of the COVID-19 declaration under Section 564(b)(1) of the Act, 21 U.S.C. section 360bbb-3(b)(1), unless the authorization is terminated or revoked.  Performed at [facility], [address] [place], [place] [address]    Alcohol, Ethyl (B) 05/03/2022 146 (H)  <10 mg/dL Final   Comment: (NOTE) Lowest detectable limit for serum alcohol is 10 mg/dL.  For medical purposes only. Performed at [facility], [address] [place], [place] [address]    SARSCOV2ONAVIRUS 2 AG 05/03/2022 NEGATIVE  NEGATIVE Final   Comment: (NOTE) SARS-CoV-2 antigen NOT DETECTED.   Negative results are presumptive.  Negative results do not preclude SARS-CoV-2 infection and should not be used as the sole basis for treatment or other patient management decisions, including infection  control decisions, particularly in the presence of clinical signs and  symptoms consistent with COVID-19, or in those who have been in contact with the virus.  Negative results must be combined with clinical observations, patient history, and epidemiological information. The expected result is Negative.  Fact Sheet for Patients: https://www.jennings-kim.com/  Fact Sheet for Healthcare Providers: https://alexander-rogers.biz/  This test is not yet approved or cleared by the [place] FDA and  has been authorized for detection and/or diagnosis of SARS-CoV-2 by FDA under an Emergency Use  Authorization (EUA).  This EUA will remain in effect (meaning this test can be used) for the duration of  the COV                          ID-19 declaration under Section  564(b)(1) of the Act, 21 U.S.C. section 360bbb-3(b)(1), unless the authorization is terminated or revoked sooner.    Admission on 05/01/2022, Discharged on 05/02/2022  Component Date Value Ref Range Status   SARS Coronavirus 2 by RT PCR 05/02/2022 NEGATIVE  NEGATIVE Final   Comment: (NOTE) SARS-CoV-2 target nucleic acids are NOT DETECTED.  The SARS-CoV-2 RNA is generally detectable in upper respiratory specimens during the acute phase of infection. The lowest concentration of SARS-CoV-2 viral copies this assay can detect is 138 copies/mL. A negative result does not preclude SARS-Cov-2 infection and should not be used as the sole basis for treatment or other patient management decisions. A negative result may occur with  improper specimen collection/handling, submission of specimen other than nasopharyngeal swab, presence of viral mutation(s) within the areas targeted by this assay, and inadequate number of viral copies(<138 copies/mL). A negative result must be combined with clinical observations, patient history, and epidemiological information. The expected result is Negative.  Fact Sheet for Patients:  BloggerCourse.com  Fact Sheet for Healthcare Providers:  SeriousBroker.it  This test is no                          t yet approved or cleared by the [place] FDA and  has been authorized for detection and/or diagnosis of SARS-CoV-2 by FDA under an Emergency Use Authorization (EUA). This EUA will remain  in effect (meaning this test can be used) for the duration of the COVID-19 declaration under Section 564(b)(1) of the Act, 21 U.S.C.section 360bbb-3(b)(1), unless the authorization is terminated  or revoked sooner.       Influenza A by PCR 05/02/2022  NEGATIVE  NEGATIVE Final   Influenza B by PCR 05/02/2022 NEGATIVE  NEGATIVE Final   Comment: (NOTE) The Xpert Xpress SARS-CoV-2/FLU/RSV plus assay is intended as an aid in the diagnosis of influenza from Nasopharyngeal swab specimens and should not be used as a sole basis for treatment. Nasal washings and aspirates are unacceptable for Xpert Xpress SARS-CoV-2/FLU/RSV testing.  Fact Sheet for Patients: BloggerCourse.com  Fact Sheet for Healthcare Providers: SeriousBroker.it  This test is not yet approved or cleared by the [place] FDA and has been authorized for detection and/or diagnosis of SARS-CoV-2 by FDA under an Emergency Use Authorization (EUA). This EUA will remain in effect (meaning this test can be used) for the duration of the COVID-19 declaration under Section 564(b)(1) of the Act, 21 U.S.C. section 360bbb-3(b)(1), unless the authorization is terminated or revoked.  Performed at [facility], [address] [place], [place] [address]    POC Amphetamine UR 05/02/2022 None Detected  NONE DETECTED (Cut Off Level 1000 ng/mL) Preliminary   POC Secobarbital (BAR) 05/02/2022 None Detected  NONE DETECTED (Cut Off Level 300 ng/mL) Preliminary   POC Buprenorphine (BUP) 05/02/2022 None Detected  NONE DETECTED (Cut Off Level 10 ng/mL) Preliminary   POC Oxazepam (BZO) 05/02/2022 Positive (A)  NONE DETECTED (Cut Off Level 300 ng/mL) Preliminary   POC Cocaine UR 05/02/2022 Positive (A)  NONE DETECTED (Cut Off Level 300 ng/mL) Preliminary   POC Methamphetamine UR 05/02/2022 None Detected  NONE DETECTED (Cut Off Level 1000 ng/mL) Preliminary   POC Morphine 05/02/2022 None Detected  NONE DETECTED (Cut Off Level 300 ng/mL) Preliminary   POC Methadone UR 05/02/2022 None Detected  NONE DETECTED (Cut Off Level 300 ng/mL) Preliminary   POC Oxycodone UR 05/02/2022 None Detected  NONE DETECTED (Cut Off Level 100 ng/mL) Preliminary  POC Marijuana UR 05/02/2022 Positive (A)  NONE DETECTED (Cut Off Level 50 ng/mL) Preliminary  Admission on 05/01/2022, Discharged on 05/01/2022  Component Date Value Ref Range Status   SARS Coronavirus 2 by RT PCR 05/01/2022 NEGATIVE  NEGATIVE Final   Comment: (NOTE) SARS-CoV-2 target nucleic acids are NOT DETECTED.  The SARS-CoV-2 RNA is generally detectable in upper respiratory specimens during the acute phase of infection. The lowest concentration of SARS-CoV-2 viral copies this assay can detect is 138 copies/mL. A negative result does not preclude SARS-Cov-2 infection and should not be used as the sole basis for treatment or other patient management decisions. A negative result may occur with  improper specimen collection/handling, submission of specimen other than nasopharyngeal swab, presence of viral mutation(s) within the areas targeted by this assay, and inadequate number of viral copies(<138 copies/mL). A negative result must be combined with clinical observations, patient history, and epidemiological information. The expected result is Negative.  Fact Sheet for Patients:  BloggerCourse.com  Fact Sheet for Healthcare Providers:  SeriousBroker.it  This test is no                          t yet approved or cleared by the [place] FDA and  has been authorized for detection and/or diagnosis of SARS-CoV-2 by FDA under an Emergency Use Authorization (EUA). This EUA will remain  in effect (meaning this test can be used) for the duration of the COVID-19 declaration under Section 564(b)(1) of the Act, 21 U.S.C.section 360bbb-3(b)(1), unless the authorization is terminated  or revoked sooner.       Influenza A by PCR 05/01/2022 NEGATIVE  NEGATIVE Final   Influenza B by PCR 05/01/2022 NEGATIVE  NEGATIVE Final   Comment: (NOTE) The Xpert Xpress SARS-CoV-2/FLU/RSV plus assay is intended as an aid in the diagnosis of influenza  from Nasopharyngeal swab specimens and should not be used as a sole basis for treatment. Nasal washings and aspirates are unacceptable for Xpert Xpress SARS-CoV-2/FLU/RSV testing.  Fact Sheet for Patients: BloggerCourse.com  Fact Sheet for Healthcare Providers: SeriousBroker.it  This test is not yet approved or cleared by the [place] FDA and has been authorized for detection and/or diagnosis of SARS-CoV-2 by FDA under an Emergency Use Authorization (EUA). This EUA will remain in effect (meaning this test can be used) for the duration of the COVID-19 declaration under Section 564(b)(1) of the Act, 21 U.S.C. section 360bbb-3(b)(1), unless the authorization is terminated or revoked.  Performed at [facility], [address], [place], [place] [address]    WBC 05/01/2022 4.3  4.0 - 10.5 K/uL Final   RBC 05/01/2022 4.44  4.22 - 5.81 MIL/uL Final   Hemoglobin 05/01/2022 13.8  13.0 - 17.0 g/dL Final   HCT 60/45/4098 41.3  39.0 - 52.0 % Final   MCV 05/01/2022 93.0  80.0 - 100.0 fL Final   MCH 05/01/2022 31.1  26.0 - 34.0 pg Final   MCHC 05/01/2022 33.4  30.0 - 36.0 g/dL Final   RDW 11/91/4782 13.6  11.5 - 15.5 % Final   Platelets 05/01/2022 253  150 - 400 K/uL Final   nRBC 05/01/2022 0.0  0.0 - 0.2 % Final   Neutrophils Relative % 05/01/2022 63  % Final   Neutro Abs 05/01/2022 2.7  1.7 - 7.7 K/uL Final   Lymphocytes Relative 05/01/2022 25  % Final   Lymphs Abs 05/01/2022 1.1  0.7 - 4.0 K/uL Final   Monocytes Relative  05/01/2022 9  % Final   Monocytes Absolute 05/01/2022 0.4  0.1 - 1.0 K/uL Final   Eosinophils Relative 05/01/2022 1  % Final   Eosinophils Absolute 05/01/2022 0.0  0.0 - 0.5 K/uL Final   Basophils Relative 05/01/2022 1  % Final   Basophils Absolute 05/01/2022 0.0  0.0 - 0.1 K/uL Final   Immature Granulocytes 05/01/2022 1  % Final   Abs Immature Granulocytes 05/01/2022 0.03  0.00 - 0.07 K/uL Final    Performed at [facility], [address], [place], [place] [address]   Sodium 05/01/2022 140  135 - 145 mmol/L Final   Potassium 05/01/2022 3.7  3.5 - 5.1 mmol/L Final   Chloride 05/01/2022 108  98 - 111 mmol/L Final   CO2 05/01/2022 21 (L)  22 - 32 mmol/L Final   Glucose, Bld 05/01/2022 76  70 - 99 mg/dL Final   Glucose reference range applies only to samples taken after fasting for at least 8 hours.   BUN 05/01/2022 14  6 - 20 mg/dL Final   Creatinine, Ser 05/01/2022 1.12  0.61 - 1.24 mg/dL Final   Calcium 52/84/1324 8.9  8.9 - 10.3 mg/dL Final   Total Protein 40/07/2724 8.2 (H)  6.5 - 8.1 g/dL Final   Albumin 36/64/4034 3.8  3.5 - 5.0 g/dL Final   AST 74/25/9563 30  15 - 41 U/L Final   ALT 05/01/2022 34  0 - 44 U/L Final   Alkaline Phosphatase 05/01/2022 53  38 - 126 U/L Final   Total Bilirubin 05/01/2022 0.4  0.3 - 1.2 mg/dL Final   GFR, Estimated 05/01/2022 >60  >60 mL/min Final   Comment: (NOTE) Calculated using the CKD-EPI Creatinine Equation (2021)    Anion gap 05/01/2022 11  5 - 15 Final   Performed at [facility], [address] [place], [place] [address]   Alcohol, Ethyl (B) 05/01/2022 43 (H)  <10 mg/dL Final   Comment: (NOTE) Lowest detectable limit for serum alcohol is 10 mg/dL.  For medical purposes only. Performed at [facility], [address] [place], [place] [address]   Admission on 04/28/2022, Discharged on 04/29/2022  Component Date Value Ref Range Status   Sodium 04/28/2022 139  135 - 145 mmol/L Final   Potassium 04/28/2022 3.3 (L)  3.5 - 5.1 mmol/L Final   Chloride 04/28/2022 108  98 - 111 mmol/L Final   CO2 04/28/2022 20 (L)  22 - 32 mmol/L Final   Glucose, Bld 04/28/2022 83  70 - 99 mg/dL Final   Glucose reference range applies only to samples taken after fasting for at least 8 hours.   BUN 04/28/2022 12  6 - 20 mg/dL Final   Creatinine, Ser 04/28/2022 1.08  0.61 - 1.24 mg/dL Final    Calcium 18/84/1660 8.8 (L)  8.9 - 10.3 mg/dL Final   Total Protein 63/10/6008 7.9  6.5 - 8.1 g/dL Final   Albumin 93/23/5573 3.7  3.5 - 5.0 g/dL Final   AST 22/11/5425 34  15 - 41 U/L Final   ALT 04/28/2022 42  0 - 44 U/L Final   Alkaline Phosphatase 04/28/2022 54  38 - 126 U/L Final   Total Bilirubin 04/28/2022 0.5  0.3 - 1.2 mg/dL Final   GFR, Estimated 04/28/2022 >60  >60 mL/min Final   Comment: (NOTE) Calculated using the CKD-EPI Creatinine Equation (2021)    Anion gap 04/28/2022 11  5 - 15 Final   Performed at [facility], [address] [place], [place] [address]   Alcohol, Ethyl (B) 04/28/2022 201 (H)  <10 mg/dL Final   Comment: (NOTE) Lowest detectable limit for serum alcohol is 10 mg/dL.  For medical purposes only. Performed at [facility], [address] [place], [place] [address]    Salicylate Lvl 04/28/2022 <7.0 (L)  7.0 - 30.0 mg/dL Final   Performed at [facility], [address] [place], [place] [address]   Acetaminophen (Tylenol), Serum 04/28/2022 <10 (L)  10 - 30 ug/mL Final   Comment: (NOTE) Therapeutic concentrations vary significantly. A range of 10-30 ug/mL  may be an effective concentration for many patients. However, some  are best treated at concentrations outside of this range. Acetaminophen concentrations >150 ug/mL at 4 hours after ingestion  and >50 ug/mL at 12 hours after ingestion are often associated with  toxic reactions.  Performed at [facility], [address] [place], [place] [address]    WBC 04/28/2022 5.0  4.0 - 10.5 K/uL Final   RBC 04/28/2022 4.33  4.22 - 5.81 MIL/uL Final   Hemoglobin 04/28/2022 13.4  13.0 - 17.0 g/dL Final   HCT 29/56/213007/07/2022 40.3  39.0 - 52.0 % Final   MCV 04/28/2022 93.1  80.0 - 100.0 fL Final   MCH 04/28/2022 30.9  26.0 - 34.0 pg Final   MCHC 04/28/2022 33.3  30.0 - 36.0 g/dL Final   RDW 86/57/846907/07/2022 13.4  11.5 - 15.5 % Final   Platelets 04/28/2022 238  150 - 400 K/uL Final   nRBC  04/28/2022 0.0  0.0 - 0.2 % Final   Performed at [facility], [address] [place], [place] [address]   Opiates 04/28/2022 NONE DETECTED  NONE DETECTED Final   Cocaine 04/28/2022 NONE DETECTED  NONE DETECTED Final   Benzodiazepines 04/28/2022 NONE DETECTED  NONE DETECTED Final   Amphetamines 04/28/2022 NONE DETECTED  NONE DETECTED Final   Tetrahydrocannabinol 04/28/2022 POSITIVE (A)  NONE DETECTED Final   Barbiturates 04/28/2022 NONE DETECTED  NONE DETECTED Final   Comment: (NOTE) DRUG SCREEN FOR MEDICAL PURPOSES ONLY.  IF CONFIRMATION IS NEEDED FOR ANY PURPOSE, NOTIFY LAB WITHIN 5 DAYS.  LOWEST DETECTABLE LIMITS FOR URINE DRUG SCREEN Drug Class                     Cutoff (ng/mL) Amphetamine and metabolites    1000 Barbiturate and metabolites    200 Benzodiazepine                 200 Tricyclics and metabolites     300 Opiates and metabolites        300 Cocaine and metabolites        300 THC                            50 Performed at [facility], [address] [place], [place] [address]    Color, Urine 04/28/2022 YELLOW  YELLOW Final   APPearance 04/28/2022 CLEAR  CLEAR Final   Specific Gravity, Urine 04/28/2022 1.025  1.005 - 1.030 Final   pH 04/28/2022 5.0  5.0 - 8.0 Final   Glucose, UA 04/28/2022 NEGATIVE  NEGATIVE mg/dL Final   Hgb urine dipstick 04/28/2022 SMALL (A)  NEGATIVE Final   Bilirubin Urine 04/28/2022 NEGATIVE  NEGATIVE Final   Ketones, ur 04/28/2022 NEGATIVE  NEGATIVE mg/dL Final   Protein, ur 44/01/027207/07/2022 NEGATIVE  NEGATIVE mg/dL Final   Nitrite 91/47/8295 NEGATIVE  NEGATIVE Final   Leukocytes,Ua 04/28/2022 NEGATIVE  NEGATIVE Final   RBC / HPF 04/28/2022 0-5  0 - 5 RBC/hpf Final   WBC, UA 04/28/2022 6-10  0 - 5 WBC/hpf Final   Bacteria, UA 04/28/2022 NONE SEEN  NONE SEEN Final   Squamous Epithelial / LPF 04/28/2022 0-5  0 - 5 Final   Mucus 04/28/2022 PRESENT   Final   Performed at [facility], [address] [place], [place] [address]    Troponin I (High Sensitivity) 04/28/2022 6  <18 ng/L Final   Comment: (NOTE) Elevated high sensitivity troponin I (hsTnI) values and significant  changes across serial measurements may suggest ACS but many other  chronic and acute conditions are known to elevate hsTnI results.  Refer to the "Links" section for chest pain algorithms and additional  guidance. Performed at [facility], [address] [place], [place] [address]   Admission on 04/25/2022, Discharged on 04/26/2022  Component Date Value Ref Range Status   Sodium 04/25/2022 137  135 - 145 mmol/L Final   Potassium 04/25/2022 3.2 (L)  3.5 - 5.1 mmol/L Final   Chloride 04/25/2022 107  98 - 111 mmol/L Final   CO2 04/25/2022 18 (L)  22 - 32 mmol/L Final   Glucose, Bld 04/25/2022 98  70 - 99 mg/dL Final   Glucose reference range applies only to samples taken after fasting for at least 8 hours.   BUN 04/25/2022 15  6 - 20 mg/dL Final   Creatinine, Ser 04/25/2022 1.09  0.61 - 1.24 mg/dL Final   Calcium 46/96/2952 8.8 (L)  8.9 - 10.3 mg/dL Final   GFR, Estimated 04/25/2022 >60  >60 mL/min Final   Comment: (NOTE) Calculated using the CKD-EPI Creatinine Equation (2021)    Anion gap 04/25/2022 12  5 - 15 Final   Performed at [facility], [address], [place], [place] [address]   WBC 04/25/2022 4.1  4.0 - 10.5 K/uL Final   RBC 04/25/2022 4.37  4.22 - 5.81 MIL/uL Final   Hemoglobin 04/25/2022 13.6  13.0 - 17.0 g/dL Final   HCT 44/10/270 40.6  39.0 - 52.0 % Final   MCV 04/25/2022 92.9  80.0 - 100.0 fL Final   MCH 04/25/2022 31.1  26.0 - 34.0 pg Final   MCHC 04/25/2022 33.5  30.0 - 36.0 g/dL Final   RDW 53/66/4403 13.5  11.5 - 15.5 % Final   Platelets 04/25/2022 255  150 - 400 K/uL Final   nRBC 04/25/2022 0.0  0.0 - 0.2 % Final   Performed at [facility], [address], [place], [place] [address]   Troponin I (High Sensitivity) 04/25/2022 6  <18 ng/L Final   Comment: (NOTE) Elevated high sensitivity  troponin I (hsTnI) values and significant  changes across serial measurements may suggest ACS but many other  chronic and acute conditions are known to elevate hsTnI results.  Refer to the "Links" section for chest pain algorithms and additional  guidance. Performed at [facility], [address] [place], [place] [address]    Troponin I (High Sensitivity) 04/26/2022 6  <18 ng/L Final   Comment: (NOTE) Elevated high sensitivity troponin I (hsTnI) values and significant  changes across serial measurements may suggest ACS but many other  chronic and acute conditions are known to elevate hsTnI results.  Refer to the "Links" section for chest pain algorithms and additional  guidance. Performed at [facility], [address], [place], [place] [address]   Admission on 03/24/2022, Discharged on 03/25/2022  Component Date Value Ref Range Status   Sodium 03/24/2022 140  135 - 145 mmol/L Final   Potassium 03/24/2022 3.8  3.5 - 5.1 mmol/L Final   Chloride 03/24/2022 108  98 - 111 mmol/L Final   CO2 03/24/2022 22  22 - 32 mmol/L Final   Glucose, Bld 03/24/2022 80  70 - 99 mg/dL Final   Glucose reference range applies only to samples taken after fasting for at least 8 hours.   BUN 03/24/2022 13  6 - 20 mg/dL Final   Creatinine, Ser 03/24/2022 1.13  0.61 - 1.24 mg/dL Final   Calcium 60/45/4098 9.0  8.9 - 10.3 mg/dL Final   Total Protein 11/91/4782 8.1  6.5 - 8.1 g/dL Final   Albumin 95/62/1308 3.6  3.5 - 5.0 g/dL Final   AST 65/78/4696 119 (H)  15 - 41 U/L Final   ALT 03/24/2022 70 (H)  0 - 44 U/L Final   Alkaline Phosphatase 03/24/2022 57  38 - 126 U/L Final   Total Bilirubin 03/24/2022 0.9  0.3 - 1.2 mg/dL Final   GFR, Estimated 03/24/2022 >60  >60 mL/min Final   Comment: (NOTE) Calculated using the CKD-EPI Creatinine Equation (2021)    Anion gap 03/24/2022 10  5 - 15 Final   Performed at [facility], [address], [place], [place] [address]   Alcohol, Ethyl (B)  03/24/2022 131 (H)  <10 mg/dL Final   Comment: (NOTE) Lowest detectable limit for serum alcohol is 10 mg/dL.  For medical purposes only. Performed at [facility], [address] [place] [address], [place], [place] [address]    Salicylate Lvl 03/24/2022 <7.0 (L)  7.0 - 30.0 mg/dL Final   Performed at [facility], [address], [place], [place] [address]   Acetaminophen (Tylenol), Serum 03/24/2022 <10 (L)  10 - 30 ug/mL Final   Comment: (NOTE) Therapeutic concentrations vary significantly. A range of 10-30 ug/mL  may be an effective concentration for many patients. However, some  are best treated at concentrations outside of this range. Acetaminophen concentrations >150 ug/mL at 4 hours after ingestion  and >50 ug/mL at 12 hours after ingestion are often associated with  toxic reactions.  Performed at [facility], [address] [place], [place] [address]    WBC 03/24/2022 3.8 (L)  4.0 - 10.5 K/uL Final   RBC 03/24/2022 4.67  4.22 - 5.81 MIL/uL Final   Hemoglobin 03/24/2022 14.4  13.0 - 17.0 g/dL Final   HCT 64/40/3474 44.6  39.0 - 52.0 % Final   MCV 03/24/2022 95.5  80.0 - 100.0 fL Final   MCH 03/24/2022 30.8  26.0 - 34.0 pg Final   MCHC 03/24/2022 32.3  30.0 - 36.0 g/dL Final   RDW 25/95/6387 14.8  11.5 - 15.5 % Final   Platelets 03/24/2022 275  150 - 400 K/uL Final   nRBC 03/24/2022 0.0  0.0 - 0.2 % Final   Performed at [facility], [address], [place], [place] [address]   Opiates 03/25/2022 NONE DETECTED  NONE DETECTED Final   Cocaine 03/25/2022 NONE DETECTED  NONE DETECTED Final   Benzodiazepines 03/25/2022 NONE DETECTED  NONE DETECTED Final   Amphetamines 03/25/2022 NONE DETECTED  NONE DETECTED Final   Tetrahydrocannabinol 03/25/2022 POSITIVE (A)  NONE DETECTED Final   Barbiturates 03/25/2022 NONE DETECTED  NONE  DETECTED Final   Comment: (NOTE) DRUG SCREEN FOR MEDICAL PURPOSES ONLY.  IF CONFIRMATION IS NEEDED FOR ANY PURPOSE, NOTIFY LAB WITHIN 5  DAYS.  LOWEST DETECTABLE LIMITS FOR URINE DRUG SCREEN Drug Class                     Cutoff (ng/mL) Amphetamine and metabolites    1000 Barbiturate and metabolites    200 Benzodiazepine                 200 Tricyclics and metabolites     300 Opiates and metabolites        300 Cocaine and metabolites        300 THC                            50 Performed at [facility], [address], [place], [place] [address]   Admission on 03/02/2022, Discharged on 03/03/2022  Component Date Value Ref Range Status   Sodium 03/02/2022 137  135 - 145 mmol/L Final   Potassium 03/02/2022 3.3 (L)  3.5 - 5.1 mmol/L Final   Chloride 03/02/2022 105  98 - 111 mmol/L Final   CO2 03/02/2022 20 (L)  22 - 32 mmol/L Final   Glucose, Bld 03/02/2022 87  70 - 99 mg/dL Final   Glucose reference range applies only to samples taken after fasting for at least 8 hours.   BUN 03/02/2022 10  6 - 20 mg/dL Final   Creatinine, Ser 03/02/2022 0.94  0.61 - 1.24 mg/dL Final   Calcium 91/47/8295 9.1  8.9 - 10.3 mg/dL Final   Total Protein 62/13/0865 8.5 (H)  6.5 - 8.1 g/dL Final   Albumin 78/46/9629 3.8  3.5 - 5.0 g/dL Final   AST 52/84/1324 41  15 - 41 U/L Final   ALT 03/02/2022 38  0 - 44 U/L Final   Alkaline Phosphatase 03/02/2022 61  38 - 126 U/L Final   Total Bilirubin 03/02/2022 1.1  0.3 - 1.2 mg/dL Final   GFR, Estimated 03/02/2022 >60  >60 mL/min Final   Comment: (NOTE) Calculated using the CKD-EPI Creatinine Equation (2021)    Anion gap 03/02/2022 12  5 - 15 Final   Performed at [facility], [address], [place], [place] [address]   WBC 03/02/2022 4.9  4.0 - 10.5 K/uL Final   RBC 03/02/2022 4.47  4.22 - 5.81 MIL/uL Final   Hemoglobin 03/02/2022 14.4  13.0 - 17.0 g/dL Final   HCT 72/53/6644 42.8  39.0 - 52.0 % Final   MCV 03/02/2022 95.7  80.0 - 100.0 fL Final   MCH 03/02/2022 32.2  26.0 - 34.0 pg Final   MCHC 03/02/2022 33.6  30.0 - 36.0 g/dL Final   RDW 03/47/4259 15.8 (H)  11.5 - 15.5 %  Final   Platelets 03/02/2022 310  150 - 400 K/uL Final   nRBC 03/02/2022 0.0  0.0 - 0.2 % Final   Neutrophils Relative % 03/02/2022 39  % Final   Neutro Abs 03/02/2022 1.9  1.7 - 7.7 K/uL Final   Lymphocytes Relative 03/02/2022 41  % Final   Lymphs Abs 03/02/2022 1.9  0.7 - 4.0 K/uL Final   Monocytes Relative 03/02/2022 12  % Final   Monocytes Absolute 03/02/2022 0.6  0.1 - 1.0 K/uL Final   Eosinophils Relative 03/02/2022 7  % Final   Eosinophils Absolute 03/02/2022 0.4  0.0 - 0.5 K/uL  Final   Basophils Relative 03/02/2022 1  % Final   Basophils Absolute 03/02/2022 0.0  0.0 - 0.1 K/uL Final   Immature Granulocytes 03/02/2022 0  % Final   Abs Immature Granulocytes 03/02/2022 0.01  0.00 - 0.07 K/uL Final   Performed at [facility], [address] [place], [place] [address]   Alcohol, Ethyl (B) 03/02/2022 90 (H)  <10 mg/dL Final   Comment: (NOTE) Lowest detectable limit for serum alcohol is 10 mg/dL.  For medical purposes only. Performed at [facility], [address], [place], [place] [address]    Acetaminophen (Tylenol), Serum 03/02/2022 <10 (L)  10 - 30 ug/mL Final   Comment: (NOTE) Therapeutic concentrations vary significantly. A range of 10-30 ug/mL  may be an effective concentration for many patients. However, some  are best treated at concentrations outside of this range. Acetaminophen concentrations >150 ug/mL at 4 hours after ingestion  and >50 ug/mL at 12 hours after ingestion are often associated with  toxic reactions.  Performed at [facility], [address] [place], [place] [address]    Salicylate Lvl 03/02/2022 <7.0 (L)  7.0 - 30.0 mg/dL Final   Performed at [facility], [address], [place], [place] [address]  Admission on 03/01/2022, Discharged on 03/01/2022  Component Date Value Ref Range Status   Sodium 03/01/2022 139  135 - 145 mmol/L Final   Potassium 03/01/2022 3.9  3.5 - 5.1 mmol/L Final   Chloride 03/01/2022 106  98 - 111  mmol/L Final   CO2 03/01/2022 21 (L)  22 - 32 mmol/L Final   Glucose, Bld 03/01/2022 83  70 - 99 mg/dL Final   Glucose reference range applies only to samples taken after fasting for at least 8 hours.   BUN 03/01/2022 19  6 - 20 mg/dL Final   Creatinine, Ser 03/01/2022 1.01  0.61 - 1.24 mg/dL Final   Calcium 29/56/2130 8.9  8.9 - 10.3 mg/dL Final   Total Protein 86/57/8469 8.7 (H)  6.5 - 8.1 g/dL Final   Albumin 62/95/2841 3.9  3.5 - 5.0 g/dL Final   AST 32/44/0102 42 (H)  15 - 41 U/L Final   ALT 03/01/2022 36  0 - 44 U/L Final   Alkaline Phosphatase 03/01/2022 59  38 - 126 U/L Final   Total Bilirubin 03/01/2022 0.9  0.3 - 1.2 mg/dL Final   GFR, Estimated 03/01/2022 >60  >60 mL/min Final   Comment: (NOTE) Calculated using the CKD-EPI Creatinine Equation (2021)    Anion gap 03/01/2022 12  5 - 15 Final   Performed at [facility], [address] [place], [place] [address]   Alcohol, Ethyl (B) 03/01/2022 105 (H)  <10 mg/dL Final   Comment: (NOTE) Lowest detectable limit for serum alcohol is 10 mg/dL.  For medical purposes only. Performed at [facility], [address], [place], [place] [address]    Salicylate Lvl 03/01/2022 <7.0 (L)  7.0 - 30.0 mg/dL Final   Performed at [facility], [address], [place], [place] [address]   Acetaminophen (Tylenol), Serum 03/01/2022 <10 (L)  10 - 30 ug/mL Final   Comment: (NOTE) Therapeutic concentrations vary significantly. A range of 10-30 ug/mL  may be an effective concentration for many patients. However, some  are best treated at concentrations outside of this range. Acetaminophen concentrations >150 ug/mL at 4 hours after ingestion  and >50 ug/mL at 12 hours after ingestion are  often associated with  toxic reactions.  Performed at [facility], [address], [place], [place] [address]    WBC 03/01/2022 5.3  4.0 - 10.5 K/uL Final   RBC 03/01/2022 4.46  4.22  - 5.81 MIL/uL Final   Hemoglobin 03/01/2022 14.2  13.0 - 17.0 g/dL Final   HCT 60/45/4098 42.3  39.0 - 52.0 % Final   MCV 03/01/2022 94.8  80.0 - 100.0 fL Final   MCH 03/01/2022 31.8  26.0 - 34.0 pg Final   MCHC 03/01/2022 33.6  30.0 - 36.0 g/dL Final   RDW 11/91/4782 15.9 (H)  11.5 - 15.5 % Final   Platelets 03/01/2022 311  150 - 400 K/uL Final   nRBC 03/01/2022 0.0  0.0 - 0.2 % Final   Performed at [facility], [address] [place], [place] [address]   Opiates 03/01/2022 NONE DETECTED  NONE DETECTED Final   Cocaine 03/01/2022 NONE DETECTED  NONE DETECTED Final   Benzodiazepines 03/01/2022 NONE DETECTED  NONE DETECTED Final   Amphetamines 03/01/2022 NONE DETECTED  NONE DETECTED Final   Tetrahydrocannabinol 03/01/2022 POSITIVE (A)  NONE DETECTED Final   Barbiturates 03/01/2022 NONE DETECTED  NONE DETECTED Final   Comment: (NOTE) DRUG SCREEN FOR MEDICAL PURPOSES ONLY.  IF CONFIRMATION IS NEEDED FOR ANY PURPOSE, NOTIFY LAB WITHIN 5 DAYS.  LOWEST DETECTABLE LIMITS FOR URINE DRUG SCREEN Drug Class                     Cutoff (ng/mL) Amphetamine and metabolites    1000 Barbiturate and metabolites    200 Benzodiazepine                 200 Tricyclics and metabolites     300 Opiates and metabolites        300 Cocaine and metabolites        300 THC                            50 Performed at [facility], [address], [place], [place] [address]   Admission on 02/27/2022, Discharged on 02/27/2022  Component Date Value Ref Range Status   SARS Coronavirus 2 by RT PCR 02/27/2022 NEGATIVE  NEGATIVE Final   Comment: (NOTE) SARS-CoV-2 target nucleic acids are NOT DETECTED.  The SARS-CoV-2 RNA is generally detectable in upper respiratory specimens during the acute phase of infection. The lowest concentration of SARS-CoV-2 viral copies this assay can detect is 138 copies/mL. A negative result does not preclude SARS-Cov-2 infection and should not  be used as the sole basis for treatment or other patient management decisions. A negative result may occur with  improper specimen collection/handling, submission of specimen other than nasopharyngeal swab, presence of viral mutation(s) within the areas targeted by this assay, and inadequate number of viral copies(<138 copies/mL). A negative result must be combined with clinical observations, patient history, and epidemiological information. The expected result is Negative.  Fact Sheet for Patients:  BloggerCourse.com  Fact Sheet for Healthcare Providers:  SeriousBroker.it  This test is no                          t yet approved or cleared by the [place] FDA and  has been authorized for detection and/or diagnosis of SARS-CoV-2 by FDA under an Emergency Use Authorization (EUA). This EUA will remain  in effect (meaning this test  can be used) for the duration of the COVID-19 declaration under Section 564(b)(1) of the Act, 21 U.S.C.section 360bbb-3(b)(1), unless the authorization is terminated  or revoked sooner.       Influenza A by PCR 02/27/2022 NEGATIVE  NEGATIVE Final   Influenza B by PCR 02/27/2022 NEGATIVE  NEGATIVE Final   Comment: (NOTE) The Xpert Xpress SARS-CoV-2/FLU/RSV plus assay is intended as an aid in the diagnosis of influenza from Nasopharyngeal swab specimens and should not be used as a sole basis for treatment. Nasal washings and aspirates are unacceptable for Xpert Xpress SARS-CoV-2/FLU/RSV testing.  Fact Sheet for Patients: BloggerCourse.com  Fact Sheet for Healthcare Providers: SeriousBroker.it  This test is not yet approved or cleared by the [place] FDA and has been authorized for detection and/or diagnosis of SARS-CoV-2 by FDA under an Emergency Use Authorization (EUA). This EUA will remain in effect (meaning this test can be used) for the  duration of the COVID-19 declaration under Section 564(b)(1) of the Act, 21 U.S.C. section 360bbb-3(b)(1), unless the authorization is terminated or revoked.  Performed at [facility], [address] [place], [place] [address]    WBC 02/27/2022 4.6  4.0 - 10.5 K/uL Final   RBC 02/27/2022 4.44  4.22 - 5.81 MIL/uL Final   Hemoglobin 02/27/2022 14.2  13.0 - 17.0 g/dL Final   HCT 98/08/9146 41.3  39.0 - 52.0 % Final   MCV 02/27/2022 93.0  80.0 - 100.0 fL Final   MCH 02/27/2022 32.0  26.0 - 34.0 pg Final   MCHC 02/27/2022 34.4  30.0 - 36.0 g/dL Final   RDW 82/95/6213 15.7 (H)  11.5 - 15.5 % Final   Platelets 02/27/2022 293  150 - 400 K/uL Final   nRBC 02/27/2022 0.0  0.0 - 0.2 % Final   Neutrophils Relative % 02/27/2022 44  % Final   Neutro Abs 02/27/2022 2.0  1.7 - 7.7 K/uL Final   Lymphocytes Relative 02/27/2022 43  % Final   Lymphs Abs 02/27/2022 2.0  0.7 - 4.0 K/uL Final   Monocytes Relative 02/27/2022 8  % Final   Monocytes Absolute 02/27/2022 0.4  0.1 - 1.0 K/uL Final   Eosinophils Relative 02/27/2022 4  % Final   Eosinophils Absolute 02/27/2022 0.2  0.0 - 0.5 K/uL Final   Basophils Relative 02/27/2022 1  % Final   Basophils Absolute 02/27/2022 0.0  0.0 - 0.1 K/uL Final   Immature Granulocytes 02/27/2022 0  % Final   Abs Immature Granulocytes 02/27/2022 0.02  0.00 - 0.07 K/uL Final   Performed at [facility], [address] [place], [place] [address]   Sodium 02/27/2022 140  135 - 145 mmol/L Final   Potassium 02/27/2022 4.1  3.5 - 5.1 mmol/L Final   Chloride 02/27/2022 106  98 - 111 mmol/L Final   CO2 02/27/2022 28  22 - 32 mmol/L Final   Glucose, Bld 02/27/2022 91  70 - 99 mg/dL Final   Glucose reference range applies only to samples taken after fasting for at least 8 hours.   BUN 02/27/2022 6  6 - 20 mg/dL Final   Creatinine, Ser 02/27/2022 1.00  0.61 - 1.24 mg/dL Final   Calcium 84/69/6295 9.0  8.9 - 10.3 mg/dL Final   Total Protein 28/41/3244 7.9  6.5 -  8.1 g/dL Final   Albumin 10/22/7251 3.7  3.5 - 5.0 g/dL Final   AST 66/44/0347 36  15 - 41 U/L Final   ALT 02/27/2022 30  0 -  44 U/L Final   Alkaline Phosphatase 02/27/2022 63  38 - 126 U/L Final   Total Bilirubin 02/27/2022 0.8  0.3 - 1.2 mg/dL Final   GFR, Estimated 02/27/2022 >60  >60 mL/min Final   Comment: (NOTE) Calculated using the CKD-EPI Creatinine Equation (2021)    Anion gap 02/27/2022 6  5 - 15 Final   Performed at [facility], [address] [place], [place] [address]   Alcohol, Ethyl (B) 02/27/2022 104 (H)  <10 mg/dL Final   Comment: (NOTE) Lowest detectable limit for serum alcohol is 10 mg/dL.  For medical purposes only. Performed at [facility], [address] [place], [place] [address]    Magnesium 02/27/2022 2.2  1.7 - 2.4 mg/dL Final   Performed at [facility], [address] [place], [place] [address]   TSH 02/27/2022 2.170  0.350 - 4.500 uIU/mL Final   Comment: Performed by a 3rd Generation assay with a functional sensitivity of <=0.01 uIU/mL. Performed at [facility], [address] [place], [place] [address]    POC Amphetamine UR 02/27/2022 None Detected  NONE DETECTED (Cut Off Level 1000 ng/mL) Final   POC Secobarbital (BAR) 02/27/2022 None Detected  NONE DETECTED (Cut Off Level 300 ng/mL) Final   POC Buprenorphine (BUP) 02/27/2022 None Detected  NONE DETECTED (Cut Off Level 10 ng/mL) Final   POC Oxazepam (BZO) 02/27/2022 None Detected  NONE DETECTED (Cut Off Level 300 ng/mL) Final   POC Cocaine UR 02/27/2022 None Detected  NONE DETECTED (Cut Off Level 300 ng/mL) Final   POC Methamphetamine UR 02/27/2022 None Detected  NONE DETECTED (Cut Off Level 1000 ng/mL) Final   POC Morphine 02/27/2022 None Detected  NONE DETECTED (Cut Off Level 300 ng/mL) Final   POC Oxycodone UR 02/27/2022 None Detected  NONE DETECTED (Cut Off Level 100 ng/mL) Final   POC Methadone UR 02/27/2022 None Detected  NONE DETECTED (Cut Off Level 300 ng/mL)  Final   POC Marijuana UR 02/27/2022 Positive (A)  NONE DETECTED (Cut Off Level 50 ng/mL) Final   SARSCOV2ONAVIRUS 2 AG 02/27/2022 NEGATIVE  NEGATIVE Final   Comment: (NOTE) SARS-CoV-2 antigen NOT DETECTED.   Negative results are presumptive.  Negative results do not preclude SARS-CoV-2 infection and should not be used as the sole basis for treatment or other patient management decisions, including infection  control decisions, particularly in the presence of clinical signs and  symptoms consistent with COVID-19, or in those who have been in contact with the virus.  Negative results must be combined with clinical observations, patient history, and epidemiological information. The expected result is Negative.  Fact Sheet for Patients: https://www.jennings-kim.com/  Fact Sheet for Healthcare Providers: https://alexander-rogers.biz/  This test is not yet approved or cleared by the [place] FDA and  has been authorized for detection and/or diagnosis of SARS-CoV-2 by FDA under an Emergency Use Authorization (EUA).  This EUA will remain in effect (meaning this test can be used) for the duration of  the COV                          ID-19 declaration under Section 564(b)(1) of the Act, 21 U.S.C. section 360bbb-3(b)(1), unless the authorization is terminated or revoked sooner.    Admission on 02/24/2022, Discharged on 02/25/2022  Component Date Value Ref Range Status   Sodium 02/24/2022 140  135 - 145 mmol/L Final   Potassium 02/24/2022 3.0 (L)  3.5 - 5.1  mmol/L Final   Chloride 02/24/2022 110  98 - 111 mmol/L Final   CO2 02/24/2022 20 (L)  22 - 32 mmol/L Final   Glucose, Bld 02/24/2022 87  70 - 99 mg/dL Final   Glucose reference range applies only to samples taken after fasting for at least 8 hours.   BUN 02/24/2022 13  6 - 20 mg/dL Final   Creatinine, Ser 02/24/2022 1.30 (H)  0.61 - 1.24 mg/dL Final   Calcium 16/07/9603 8.8 (L)  8.9 - 10.3 mg/dL Final    Total Protein 02/24/2022 8.5 (H)  6.5 - 8.1 g/dL Final   Albumin 54/06/8118 3.8  3.5 - 5.0 g/dL Final   AST 14/78/2956 37  15 - 41 U/L Final   ALT 02/24/2022 33  0 - 44 U/L Final   Alkaline Phosphatase 02/24/2022 60  38 - 126 U/L Final   Total Bilirubin 02/24/2022 0.7  0.3 - 1.2 mg/dL Final   GFR, Estimated 02/24/2022 >60  >60 mL/min Final   Comment: (NOTE) Calculated using the CKD-EPI Creatinine Equation (2021)    Anion gap 02/24/2022 10  5 - 15 Final   Performed at [facility], [address], [place], [place] [address]   Alcohol, Ethyl (B) 02/24/2022 277 (H)  <10 mg/dL Final   Comment: (NOTE) Lowest detectable limit for serum alcohol is 10 mg/dL.  For medical purposes only. Performed at [facility], [address] [place], [place] [address]    WBC 02/24/2022 5.9  4.0 - 10.5 K/uL Final   RBC 02/24/2022 4.44  4.22 - 5.81 MIL/uL Final   Hemoglobin 02/24/2022 14.3  13.0 - 17.0 g/dL Final   HCT 69/62/9528 41.9  39.0 - 52.0 % Final   MCV 02/24/2022 94.4  80.0 - 100.0 fL Final   MCH 02/24/2022 32.2  26.0 - 34.0 pg Final   MCHC 02/24/2022 34.1  30.0 - 36.0 g/dL Final   RDW 41/32/4401 15.7 (H)  11.5 - 15.5 % Final   Platelets 02/24/2022 286  150 - 400 K/uL Final   nRBC 02/24/2022 0.0  0.0 - 0.2 % Final   Performed at [facility], [address] [place], [place] [address]   Opiates 02/24/2022 NONE DETECTED  NONE DETECTED Final   Cocaine 02/24/2022 NONE DETECTED  NONE DETECTED Final   Benzodiazepines 02/24/2022 NONE DETECTED  NONE DETECTED Final   Amphetamines 02/24/2022 NONE DETECTED  NONE DETECTED Final   Tetrahydrocannabinol 02/24/2022 POSITIVE (A)  NONE DETECTED Final   Barbiturates 02/24/2022 NONE DETECTED  NONE DETECTED Final   Comment: (NOTE) DRUG SCREEN FOR MEDICAL PURPOSES ONLY.  IF CONFIRMATION IS NEEDED FOR ANY PURPOSE, NOTIFY LAB WITHIN 5 DAYS.  LOWEST DETECTABLE LIMITS FOR URINE DRUG SCREEN Drug Class                      Cutoff (ng/mL) Amphetamine and metabolites    1000 Barbiturate and metabolites    200 Benzodiazepine                 200 Tricyclics and metabolites     300 Opiates and metabolites        300 Cocaine and metabolites        300 THC                            50 Performed at [facility], [address] [place], [place] [address]  SARS Coronavirus 2 by RT PCR 02/24/2022 NEGATIVE  NEGATIVE Final   Comment: (NOTE) SARS-CoV-2 target nucleic acids are NOT DETECTED.  The SARS-CoV-2 RNA is generally detectable in upper respiratory specimens during the acute phase of infection. The lowest concentration of SARS-CoV-2 viral copies this assay can detect is 138 copies/mL. A negative result does not preclude SARS-Cov-2 infection and should not be used as the sole basis for treatment or other patient management decisions. A negative result may occur with  improper specimen collection/handling, submission of specimen other than nasopharyngeal swab, presence of viral mutation(s) within the areas targeted by this assay, and inadequate number of viral copies(<138 copies/mL). A negative result must be combined with clinical observations, patient history, and epidemiological information. The expected result is Negative.  Fact Sheet for Patients:  BloggerCourse.com  Fact Sheet for Healthcare Providers:  SeriousBroker.it  This test is no                          t yet approved or cleared by the [place] FDA and  has been authorized for detection and/or diagnosis of SARS-CoV-2 by FDA under an Emergency Use Authorization (EUA). This EUA will remain  in effect (meaning this test can be used) for the duration of the COVID-19 declaration under Section 564(b)(1) of the Act, 21 U.S.C.section 360bbb-3(b)(1), unless the authorization is terminated  or revoked sooner.       Influenza A by PCR 02/24/2022 NEGATIVE   NEGATIVE Final   Influenza B by PCR 02/24/2022 NEGATIVE  NEGATIVE Final   Comment: (NOTE) The Xpert Xpress SARS-CoV-2/FLU/RSV plus assay is intended as an aid in the diagnosis of influenza from Nasopharyngeal swab specimens and should not be used as a sole basis for treatment. Nasal washings and aspirates are unacceptable for Xpert Xpress SARS-CoV-2/FLU/RSV testing.  Fact Sheet for Patients: BloggerCourse.com  Fact Sheet for Healthcare Providers: SeriousBroker.it  This test is not yet approved or cleared by the [place] FDA and has been authorized for detection and/or diagnosis of SARS-CoV-2 by FDA under an Emergency Use Authorization (EUA). This EUA will remain in effect (meaning this test can be used) for the duration of the COVID-19 declaration under Section 564(b)(1) of the Act, 21 U.S.C. section 360bbb-3(b)(1), unless the authorization is terminated or revoked.  Performed at [facility] [address] [place], [place] [address]    Salicylate Lvl 02/24/2022 <7.0 (L)  7.0 - 30.0 mg/dL Final   Performed at [facility], [address], [place], [place] [address]   Acetaminophen (Tylenol), Serum 02/24/2022 <10 (L)  10 - 30 ug/mL Final   Comment: (NOTE) Therapeutic concentrations vary significantly. A range of 10-30 ug/mL  may be an effective concentration for many patients. However, some  are best treated at concentrations outside of this range. Acetaminophen concentrations >150 ug/mL at 4 hours after ingestion  and >50 ug/mL at 12 hours after ingestion are often associated with  toxic reactions.  Performed at [facility], [address], [place], [place] [address]   There may be more visits with results that are not included.    Allergies: Bee venom, Lisinopril, and Sulfamethoxazole-trimethoprim  PTA Medications: (Not in a hospital admission)   Long Term  Goals: Improvement in symptoms so as ready for discharge  Short Term Goals: Patient will verbalize feelings in meetings with treatment team members., Patient will attend at least of 50% of the groups daily., Pt will complete the  PHQ9 on admission, day 3 and discharge., Patient will participate in completing the [place] Suicide Severity Rating Scale, Patient will score a low risk of violence for 24 hours prior to discharge, and Patient will take medications as prescribed daily.  Medical Decision Making  Patient will be admitted to [facility] for stabilization and management of withdrawal symptoms.  Lab Orders         Resp Panel by RT-PCR (Flu A&B, Covid) Anterior Nasal Swab         Ethanol         Urinalysis, Routine w reflex microscopic Urine, Clean Catch         POCT Urine Drug Screen - (I-Screen)         POC SARS Coronavirus 2 Ag    -CIWA protocol initiated for management of alcohol withdrawal symptoms -Lexapro 10mg /day for depression and anxiety -Trazodone 50mg /night prn for sleep.     Recommendations  Based on my evaluation the patient does not appear to have an emergency medical condition.  Maricela Bo, NP 05/03/22  9:18 PM

## 2022-05-03 NOTE — Progress Notes (Signed)
Patient was out of bed for lunch and was more appropriate in his interaction.  He has returned to room and bed is is presently sleeping.  No evidence of etoh withdrawal.  Will monitor and provide safe environment.

## 2022-05-03 NOTE — Progress Notes (Signed)
Csw provided the following resources to be utilize:   Substance Abuse Resources   [facility] - Admissions are currently completed Monday through Friday at 8am; both appointments and walk-ins are accepted.  Any individual that is a [facility] resident may present for a substance abuse screening and assessment for admission.  A person may be referred by numerous sources or self-refer.   Potential clients will be screened for medical necessity and appropriateness for the program.  Clients must meet criteria for high-intensity residential treatment services.  If clinically appropriate, a client will continue with the comprehensive clinical assessment and intake process, as well as enrollment in the [facility].  Address: [address] [place], [place] [address] Admin Hours: Mon-Fri 8AM to [facility] Hours: 24/7 Phone: [phone] Fax: [phone]  [facility]) Facility Based Crisis:  These are 3 locations for services for week and weekend: Please call before arrival:    [facility] Based Crisis [facility])  Address: [address] [place], [place] [address] Phone: [phone]  [facility] Facility Based Crisis [facility]) Address: [address] [place], [place] [address] Phone#: [phone]  [facility] Facility Based Crisis [facility]) Address: [address] [place], [place] [address] Phone#: [phone]   [facility] (offers outpatient therapy and intensive outpatient substance abuse therapy).  [address] [place], [place] [address] Phone: [phone]   Phone: [phone]  [facility] Intensive Outpatient Program [facility]) means structured individual and group addiction activities and services that are provided at an outpatient program designed to assist adult and adolescent consumers to begin recovery and learn skills for recovery maintenance. The  [facility] SAIOP program is offered at least 3 hours a day, 3 days a week. SAIOP services shall include a structured program consisting of, but not limited to, the following services: Individual counseling and support; Group counseling and support; Family counseling, training or support; Biochemical assays to identify recent drug use (e.g., urine drug screens); Strategies for relapse prevention to include community and social support systems in treatment; Life skills; Crisis contingency planning; Disease Management; and Treatment support activities that have been adapted or specifically designed for persons with physical disabilities, or persons with co-occurring disorders of mental illness and substance abuse/dependence or mental retardation/developmental disability and substance abuse/dependence.  Phone: [phone]   [facility])  Address: [address] [place], [place], [place] [address] Phone: [phone]   [facility] Address: [address] [place], [place] [address] Phone: [phone]  - a combination of group and individual sessions to meet the participants needs. This allows participants to engage in treatment and remain involved in their home and work life. - Transitional housing places program participants in a supportive living environment while they complete a treatment program and work to secure independent housing. - The Substance Abuse Intensive Outpatient Treatment Program at [employer] consists of structured group sessions and individual sessions that are designed to teach participants early recovery and relapse prevention skills. -[facility] works with the [employer] to provide a housing and treatment program for homeless veterans.   [facility] [employer].   Address: [address] [place], [place] [address] Phone#: [phone]   : Referrals to RTSA facilities can be made by [employer] and [facility].  Referrals are also accepted from physicians, private providers, hospital emergency rooms, family members, or any person who has  knowledge of someone in the need of our services.  The [facility] will also offer the following outpatient services: (Monday through Friday 8am-5pm)   Partial Hospitalization Program (PHP) Substance Abuse Intensive Outpatient Program (SA-IOP) Group Therapy Medication Management Peer Living Room We also provide (24/7):  Assessments: Our mental health clinician and providers will conduct a focused mental health evaluation, assessing for immediate safety concerns and further mental health needs. Referral: Our team will provide resources and help connect to community based mental health treatment, when indicated, including psychotherapy, psychiatry, and other specialized behavioral health or substance use disorder services (for those not already in treatment). Transitional Care: Our team providers in person bridging and/or telephonic follow-up during the patient's transition to outpatient services.   The [facility] 24-Hour Call Center: [phone] Behavioral Health Crisis Line: [phone]  Crissie Reese, MSW, Lenice Pressman Phone: [phone] Disposition/TOC

## 2022-05-03 NOTE — ED Notes (Signed)
Urine sample cup provided. Advised pt how to obtain urine sample per NP's instructions. Pt verbalized understanding.

## 2022-05-03 NOTE — ED Provider Notes (Signed)
Behavioral Health Progress Note  Date and Time: 05/03/2022 1:21 PM Name: William Powers MRN:  [id number]  Subjective:  William Powers 41 y.o., male patient presented to [facility] with passive SI and substance use.  He was admitted to the facility based crisis unit.  William Powers, 41 y.o., male patient seen face to face by this provider, consulted with Dr. Lucianne Muss; and chart reviewed on 05/03/22.  Patient reports he does not take any medications currently.  He does not have any outpatient psychiatric services in place.  He has a history of polysubstance abuse.  On admission UDS positive for benzodiazepines, cocaine, and marijuana.  EtOH level 146. Of note patient was discharged from the [facility] on 04/19/2022.  UDS not collected at this time.  EtOH on admission is 146.  Patient reports upon discharge yesterday he began drinking again.  States, "I really want to get the help this time".  He is interested in residential substance abuse treatment.  He is currently denying any withdrawal symptoms.  He denies any history of alcohol withdrawal seizures and delirium tremors.  During evaluation William Powers is observed sitting in his room.  He is calm on approach.  He is alert/oriented x4 and cooperative.  His speech is normal and he makes good eye contact.  He endorses increased depression and anxiety.  Reports a decrease in sleep and appetite.  He denies SI/HI/AVH.  However he endorsed passive SI upon admission.  Objectively he does not appear to be responding to internal/external stimuli.    Diagnosis:  Final diagnoses:  Substance induced mood disorder (HCC)  Alcohol abuse with intoxication (HCC)    Total Time spent with patient: 30 minutes  Past Psychiatric History: Patient reports polysubstance abuse Past Medical History:  Past Medical History:  Diagnosis Date   Heart failure (HCC)    HFrEF (heart failure with reduced ejection fraction) (HCC)    15% EF as of 2019   HIV (human  immunodeficiency virus infection) (HCC)    Hypertension    Polysubstance abuse (HCC)    Pulmonary embolism (HCC)     Past Surgical History:  Procedure Laterality Date   HERNIA REPAIR     Family History:  Family History  Problem Relation Age of Onset   Lupus Mother    HIV Mother    HIV Father    Family Psychiatric  History: unknown Social History:  Social History   Substance and Sexual Activity  Alcohol Use Yes   Comment: 10 pints per week     Social History   Substance and Sexual Activity  Drug Use Yes   Types: Marijuana, Cocaine    Social History   Socioeconomic History   Marital status: Divorced    Spouse name: Not on file   Number of children: Not on file   Years of education: Not on file   Highest education level: Not on file  Occupational History   Not on file  Tobacco Use   Smoking status: Some Days    Packs/day: 0.50    Types: Cigarettes, Cigars   Smokeless tobacco: Never  Vaping Use   Vaping Use: Never used  Substance and Sexual Activity   Alcohol use: Yes    Comment: 10 pints per week   Drug use: Yes    Types: Marijuana, Cocaine   Sexual activity: Not on file  Other Topics Concern   Not on file  Social History Narrative   Not on file   Social Determinants  of Health   Financial Resource Strain: Not on file  Food Insecurity: Not on file  Transportation Needs: Not on file  Physical Activity: Not on file  Stress: Not on file  Social Connections: Not on file   SDOH:  SDOH Screenings   Alcohol Screen: Not on file  Depression (PHQ2-9): Low Risk  (08/11/2021)   Depression (PHQ2-9)    PHQ-2 Score: 0  Financial Resource Strain: Not on file  Food Insecurity: Not on file  Housing: Not on file  Physical Activity: Not on file  Social Connections: Not on file  Stress: Not on file  Tobacco Use: High Risk (05/03/2022)   Patient History    Smoking Tobacco Use: Some Days    Smokeless Tobacco Use: Never    Passive Exposure: Not on file   Transportation Needs: Not on file   Additional Social History:    Pain Medications: None Prescriptions: None Over the Counter: None History of alcohol / drug use?: Yes Longest period of sobriety (when/how long): unknown Negative Consequences of Use: Financial, Personal relationships Withdrawal Symptoms: None Name of Substance 1: Alcohol 1 - Age of First Use: 15 1 - Amount (size/oz): At least 4 pints a day "guaranted" 1 - Frequency: Daily 1 - Duration: 3 years 1 - Last Use / Amount: 05/02/2022 1 - Method of Aquiring: Purchase at [facility] store 1- Route of Use: Oral Name of Substance 2: Cocaine (powder) 2 - Age of First Use: 41 years of age 25 - Amount (size/oz): quarter bag 2 - Frequency: Every 2-3 months 2 - Duration: Ongoing 2 - Last Use / Amount: 05/01/2022 2 - Method of Aquiring: Dealer 2 - Route of Substance Use: Powder inhalation                Sleep: Fair  Appetite:  Fair  Current Medications:  Current Facility-Administered Medications  Medication Dose Route Frequency Provider Last Rate Last Admin   acetaminophen (TYLENOL) tablet 650 mg  650 mg Oral Q6H PRN Ajibola, Ene A, NP       alum & mag hydroxide-simeth (MAALOX/MYLANTA) 200-200-20 MG/5ML suspension 30 mL  30 mL Oral Q4H PRN Ajibola, Ene A, NP       escitalopram (LEXAPRO) tablet 10 mg  10 mg Oral Daily Ardis Hughs, NP       hydrOXYzine (ATARAX) tablet 25 mg  25 mg Oral Q6H PRN Ajibola, Ene A, NP   25 mg at 05/03/22 5465   loperamide (IMODIUM) capsule 2-4 mg  2-4 mg Oral PRN Ajibola, Ene A, NP       LORazepam (ATIVAN) tablet 1 mg  1 mg Oral Q6H PRN Ajibola, Ene A, NP       magnesium hydroxide (MILK OF MAGNESIA) suspension 30 mL  30 mL Oral Daily PRN Ajibola, Ene A, NP       multivitamin with minerals tablet 1 tablet  1 tablet Oral Daily Ajibola, Ene A, NP   1 tablet at 05/03/22 1002   ondansetron (ZOFRAN-ODT) disintegrating tablet 4 mg  4 mg Oral Q6H PRN Ajibola, Ene A, NP       thiamine (B-1) injection  100 mg  100 mg Intramuscular Once Ajibola, Ene A, NP       [START ON 05/04/2022] thiamine tablet 100 mg  100 mg Oral Daily Ajibola, Ene A, NP       traZODone (DESYREL) tablet 50 mg  50 mg Oral QHS PRN Ajibola, Ene A, NP   50 mg at 05/03/22 [id number]  Current Outpatient Medications  Medication Sig Dispense Refill   BIKTARVY 50-200-25 MG TABS tablet Take 1 tablet by mouth daily. (Patient not taking: Reported on 03/01/2022) 30 tablet 0   metoprolol succinate (TOPROL-XL) 50 MG 24 hr tablet Take 1 tablet by mouth 2 times daily. (Patient not taking: Reported on 03/01/2022) 60 tablet 0    Labs  Lab Results:  Admission on 05/03/2022  Component Date Value Ref Range Status   SARS Coronavirus 2 by RT PCR 05/03/2022 NEGATIVE  NEGATIVE Final   Comment: (NOTE) SARS-CoV-2 target nucleic acids are NOT DETECTED.  The SARS-CoV-2 RNA is generally detectable in upper respiratory specimens during the acute phase of infection. The lowest concentration of SARS-CoV-2 viral copies this assay can detect is 138 copies/mL. A negative result does not preclude SARS-Cov-2 infection and should not be used as the sole basis for treatment or other patient management decisions. A negative result may occur with  improper specimen collection/handling, submission of specimen other than nasopharyngeal swab, presence of viral mutation(s) within the areas targeted by this assay, and inadequate number of viral copies(<138 copies/mL). A negative result must be combined with clinical observations, patient history, and epidemiological information. The expected result is Negative.  Fact Sheet for Patients:  BloggerCourse.com  Fact Sheet for Healthcare Providers:  SeriousBroker.it  This test is no                          t yet approved or cleared by the [place] FDA and  has been authorized for detection and/or diagnosis of SARS-CoV-2 by FDA under an Emergency Use  Authorization (EUA). This EUA will remain  in effect (meaning this test can be used) for the duration of the COVID-19 declaration under Section 564(b)(1) of the Act, 21 U.S.C.section 360bbb-3(b)(1), unless the authorization is terminated  or revoked sooner.       Influenza A by PCR 05/03/2022 NEGATIVE  NEGATIVE Final   Influenza B by PCR 05/03/2022 NEGATIVE  NEGATIVE Final   Comment: (NOTE) The Xpert Xpress SARS-CoV-2/FLU/RSV plus assay is intended as an aid in the diagnosis of influenza from Nasopharyngeal swab specimens and should not be used as a sole basis for treatment. Nasal washings and aspirates are unacceptable for Xpert Xpress SARS-CoV-2/FLU/RSV testing.  Fact Sheet for Patients: BloggerCourse.com  Fact Sheet for Healthcare Providers: SeriousBroker.it  This test is not yet approved or cleared by the [place] FDA and has been authorized for detection and/or diagnosis of SARS-CoV-2 by FDA under an Emergency Use Authorization (EUA). This EUA will remain in effect (meaning this test can be used) for the duration of the COVID-19 declaration under Section 564(b)(1) of the Act, 21 U.S.C. section 360bbb-3(b)(1), unless the authorization is terminated or revoked.  Performed at [facility], [address] [place], [place] [address]    Alcohol, Ethyl (B) 05/03/2022 146 (H)  <10 mg/dL Final   Comment: (NOTE) Lowest detectable limit for serum alcohol is 10 mg/dL.  For medical purposes only. Performed at [facility], [address], [place], [place] [address]    SARSCOV2ONAVIRUS 2 AG 05/03/2022 NEGATIVE  NEGATIVE Final   Comment: (NOTE) SARS-CoV-2 antigen NOT DETECTED.   Negative results are presumptive.  Negative results do not preclude SARS-CoV-2 infection and should not be used as the sole basis for treatment or other patient management decisions, including infection  control decisions, particularly  in the presence of clinical signs and  symptoms consistent with COVID-19,  or in those who have been in contact with the virus.  Negative results must be combined with clinical observations, patient history, and epidemiological information. The expected result is Negative.  Fact Sheet for Patients: https://www.jennings-kim.com/  Fact Sheet for Healthcare Providers: https://alexander-rogers.biz/  This test is not yet approved or cleared by the [place] FDA and  has been authorized for detection and/or diagnosis of SARS-CoV-2 by FDA under an Emergency Use Authorization (EUA).  This EUA will remain in effect (meaning this test can be used) for the duration of  the COV                          ID-19 declaration under Section 564(b)(1) of the Act, 21 U.S.C. section 360bbb-3(b)(1), unless the authorization is terminated or revoked sooner.    Admission on 05/01/2022, Discharged on 05/02/2022  Component Date Value Ref Range Status   SARS Coronavirus 2 by RT PCR 05/02/2022 NEGATIVE  NEGATIVE Final   Comment: (NOTE) SARS-CoV-2 target nucleic acids are NOT DETECTED.  The SARS-CoV-2 RNA is generally detectable in upper respiratory specimens during the acute phase of infection. The lowest concentration of SARS-CoV-2 viral copies this assay can detect is 138 copies/mL. A negative result does not preclude SARS-Cov-2 infection and should not be used as the sole basis for treatment or other patient management decisions. A negative result may occur with  improper specimen collection/handling, submission of specimen other than nasopharyngeal swab, presence of viral mutation(s) within the areas targeted by this assay, and inadequate number of viral copies(<138 copies/mL). A negative result must be combined with clinical observations, patient history, and epidemiological information. The expected result is Negative.  Fact Sheet for Patients:   BloggerCourse.com  Fact Sheet for Healthcare Providers:  SeriousBroker.it  This test is no                          t yet approved or cleared by the [place] FDA and  has been authorized for detection and/or diagnosis of SARS-CoV-2 by FDA under an Emergency Use Authorization (EUA). This EUA will remain  in effect (meaning this test can be used) for the duration of the COVID-19 declaration under Section 564(b)(1) of the Act, 21 U.S.C.section 360bbb-3(b)(1), unless the authorization is terminated  or revoked sooner.       Influenza A by PCR 05/02/2022 NEGATIVE  NEGATIVE Final   Influenza B by PCR 05/02/2022 NEGATIVE  NEGATIVE Final   Comment: (NOTE) The Xpert Xpress SARS-CoV-2/FLU/RSV plus assay is intended as an aid in the diagnosis of influenza from Nasopharyngeal swab specimens and should not be used as a sole basis for treatment. Nasal washings and aspirates are unacceptable for Xpert Xpress SARS-CoV-2/FLU/RSV testing.  Fact Sheet for Patients: BloggerCourse.com  Fact Sheet for Healthcare Providers: SeriousBroker.it  This test is not yet approved or cleared by the [place] FDA and has been authorized for detection and/or diagnosis of SARS-CoV-2 by FDA under an Emergency Use Authorization (EUA). This EUA will remain in effect (meaning this test can be used) for the duration of the COVID-19 declaration under Section 564(b)(1) of the Act, 21 U.S.C. section 360bbb-3(b)(1), unless the authorization is terminated or revoked.  Performed at [facility], [address], [place], [place] [address]    POC Amphetamine UR 05/02/2022 None Detected  NONE DETECTED (Cut Off Level 1000 ng/mL) Preliminary   POC Secobarbital (BAR) 05/02/2022 None Detected  NONE DETECTED (Cut  Off Level 300 ng/mL) Preliminary   POC Buprenorphine (BUP) 05/02/2022 None Detected  NONE DETECTED  (Cut Off Level 10 ng/mL) Preliminary   POC Oxazepam (BZO) 05/02/2022 Positive (A)  NONE DETECTED (Cut Off Level 300 ng/mL) Preliminary   POC Cocaine UR 05/02/2022 Positive (A)  NONE DETECTED (Cut Off Level 300 ng/mL) Preliminary   POC Methamphetamine UR 05/02/2022 None Detected  NONE DETECTED (Cut Off Level 1000 ng/mL) Preliminary   POC Morphine 05/02/2022 None Detected  NONE DETECTED (Cut Off Level 300 ng/mL) Preliminary   POC Methadone UR 05/02/2022 None Detected  NONE DETECTED (Cut Off Level 300 ng/mL) Preliminary   POC Oxycodone UR 05/02/2022 None Detected  NONE DETECTED (Cut Off Level 100 ng/mL) Preliminary   POC Marijuana UR 05/02/2022 Positive (A)  NONE DETECTED (Cut Off Level 50 ng/mL) Preliminary  Admission on 05/01/2022, Discharged on 05/01/2022  Component Date Value Ref Range Status   SARS Coronavirus 2 by RT PCR 05/01/2022 NEGATIVE  NEGATIVE Final   Comment: (NOTE) SARS-CoV-2 target nucleic acids are NOT DETECTED.  The SARS-CoV-2 RNA is generally detectable in upper respiratory specimens during the acute phase of infection. The lowest concentration of SARS-CoV-2 viral copies this assay can detect is 138 copies/mL. A negative result does not preclude SARS-Cov-2 infection and should not be used as the sole basis for treatment or other patient management decisions. A negative result may occur with  improper specimen collection/handling, submission of specimen other than nasopharyngeal swab, presence of viral mutation(s) within the areas targeted by this assay, and inadequate number of viral copies(<138 copies/mL). A negative result must be combined with clinical observations, patient history, and epidemiological information. The expected result is Negative.  Fact Sheet for Patients:  BloggerCourse.com  Fact Sheet for Healthcare Providers:  SeriousBroker.it  This test is no                          t yet approved or cleared by  the [place] FDA and  has been authorized for detection and/or diagnosis of SARS-CoV-2 by FDA under an Emergency Use Authorization (EUA). This EUA will remain  in effect (meaning this test can be used) for the duration of the COVID-19 declaration under Section 564(b)(1) of the Act, 21 U.S.C.section 360bbb-3(b)(1), unless the authorization is terminated  or revoked sooner.       Influenza A by PCR 05/01/2022 NEGATIVE  NEGATIVE Final   Influenza B by PCR 05/01/2022 NEGATIVE  NEGATIVE Final   Comment: (NOTE) The Xpert Xpress SARS-CoV-2/FLU/RSV plus assay is intended as an aid in the diagnosis of influenza from Nasopharyngeal swab specimens and should not be used as a sole basis for treatment. Nasal washings and aspirates are unacceptable for Xpert Xpress SARS-CoV-2/FLU/RSV testing.  Fact Sheet for Patients: BloggerCourse.com  Fact Sheet for Healthcare Providers: SeriousBroker.it  This test is not yet approved or cleared by the [place] FDA and has been authorized for detection and/or diagnosis of SARS-CoV-2 by FDA under an Emergency Use Authorization (EUA). This EUA will remain in effect (meaning this test can be used) for the duration of the COVID-19 declaration under Section 564(b)(1) of the Act, 21 U.S.C. section 360bbb-3(b)(1), unless the authorization is terminated or revoked.  Performed at [facility], [address], [place], [place] [address]    WBC 05/01/2022 4.3  4.0 - 10.5 K/uL Final   RBC 05/01/2022 4.44  4.22 - 5.81 MIL/uL Final   Hemoglobin 05/01/2022 13.8  13.0 - 17.0  g/dL Final   HCT 16/07/9603 41.3  39.0 - 52.0 % Final   MCV 05/01/2022 93.0  80.0 - 100.0 fL Final   MCH 05/01/2022 31.1  26.0 - 34.0 pg Final   MCHC 05/01/2022 33.4  30.0 - 36.0 g/dL Final   RDW 54/06/8118 13.6  11.5 - 15.5 % Final   Platelets 05/01/2022 253  150 - 400 K/uL Final   nRBC 05/01/2022 0.0  0.0 - 0.2 %  Final   Neutrophils Relative % 05/01/2022 63  % Final   Neutro Abs 05/01/2022 2.7  1.7 - 7.7 K/uL Final   Lymphocytes Relative 05/01/2022 25  % Final   Lymphs Abs 05/01/2022 1.1  0.7 - 4.0 K/uL Final   Monocytes Relative 05/01/2022 9  % Final   Monocytes Absolute 05/01/2022 0.4  0.1 - 1.0 K/uL Final   Eosinophils Relative 05/01/2022 1  % Final   Eosinophils Absolute 05/01/2022 0.0  0.0 - 0.5 K/uL Final   Basophils Relative 05/01/2022 1  % Final   Basophils Absolute 05/01/2022 0.0  0.0 - 0.1 K/uL Final   Immature Granulocytes 05/01/2022 1  % Final   Abs Immature Granulocytes 05/01/2022 0.03  0.00 - 0.07 K/uL Final   Performed at [facility], [address] [place], [place] [address]   Sodium 05/01/2022 140  135 - 145 mmol/L Final   Potassium 05/01/2022 3.7  3.5 - 5.1 mmol/L Final   Chloride 05/01/2022 108  98 - 111 mmol/L Final   CO2 05/01/2022 21 (L)  22 - 32 mmol/L Final   Glucose, Bld 05/01/2022 76  70 - 99 mg/dL Final   Glucose reference range applies only to samples taken after fasting for at least 8 hours.   BUN 05/01/2022 14  6 - 20 mg/dL Final   Creatinine, Ser 05/01/2022 1.12  0.61 - 1.24 mg/dL Final   Calcium 95/62/1308 8.9  8.9 - 10.3 mg/dL Final   Total Protein 65/78/4696 8.2 (H)  6.5 - 8.1 g/dL Final   Albumin 29/52/8413 3.8  3.5 - 5.0 g/dL Final   AST 24/40/1027 30  15 - 41 U/L Final   ALT 05/01/2022 34  0 - 44 U/L Final   Alkaline Phosphatase 05/01/2022 53  38 - 126 U/L Final   Total Bilirubin 05/01/2022 0.4  0.3 - 1.2 mg/dL Final   GFR, Estimated 05/01/2022 >60  >60 mL/min Final   Comment: (NOTE) Calculated using the CKD-EPI Creatinine Equation (2021)    Anion gap 05/01/2022 11  5 - 15 Final   Performed at [facility], [address] [place], [place] [address]   Alcohol, Ethyl (B) 05/01/2022 43 (H)  <10 mg/dL Final   Comment: (NOTE) Lowest detectable limit for serum alcohol is 10 mg/dL.  For medical purposes  only. Performed at [facility], [address] [place], [place] [address]   Admission on 04/28/2022, Discharged on 04/29/2022  Component Date Value Ref Range Status   Sodium 04/28/2022 139  135 - 145 mmol/L Final   Potassium 04/28/2022 3.3 (L)  3.5 - 5.1 mmol/L Final   Chloride 04/28/2022 108  98 - 111 mmol/L Final   CO2 04/28/2022 20 (L)  22 - 32 mmol/L Final   Glucose, Bld 04/28/2022 83  70 - 99 mg/dL Final   Glucose reference range applies only to samples taken after fasting for at least 8 hours.   BUN 04/28/2022 12  6 - 20 mg/dL Final   Creatinine, Ser 04/28/2022 1.08  0.61 - 1.24 mg/dL Final   Calcium 40/98/1191 8.8 (L)  8.9 - 10.3 mg/dL Final   Total Protein 47/82/9562 7.9  6.5 - 8.1 g/dL Final   Albumin 13/05/6577 3.7  3.5 - 5.0 g/dL Final   AST 46/96/2952 34  15 - 41 U/L Final   ALT 04/28/2022 42  0 - 44 U/L Final   Alkaline Phosphatase 04/28/2022 54  38 - 126 U/L Final   Total Bilirubin 04/28/2022 0.5  0.3 - 1.2 mg/dL Final   GFR, Estimated 04/28/2022 >60  >60 mL/min Final   Comment: (NOTE) Calculated using the CKD-EPI Creatinine Equation (2021)    Anion gap 04/28/2022 11  5 - 15 Final   Performed at [facility], [address] [place], [place] [address]   Alcohol, Ethyl (B) 04/28/2022 201 (H)  <10 mg/dL Final   Comment: (NOTE) Lowest detectable limit for serum alcohol is 10 mg/dL.  For medical purposes only. Performed at [facility], [address] [place], [place] [address]    Salicylate Lvl 04/28/2022 <7.0 (L)  7.0 - 30.0 mg/dL Final   Performed at [facility], [address], [place], [place] [address]   Acetaminophen (Tylenol), Serum 04/28/2022 <10 (L)  10 - 30 ug/mL Final   Comment: (NOTE) Therapeutic concentrations vary significantly. A range of 10-30 ug/mL  may be an effective concentration for many patients. However, some  are best treated at concentrations outside of this range. Acetaminophen concentrations >150  ug/mL at 4 hours after ingestion  and >50 ug/mL at 12 hours after ingestion are often associated with  toxic reactions.  Performed at [facility], [address] [place], [place] [address]    WBC 04/28/2022 5.0  4.0 - 10.5 K/uL Final   RBC 04/28/2022 4.33  4.22 - 5.81 MIL/uL Final   Hemoglobin 04/28/2022 13.4  13.0 - 17.0 g/dL Final   HCT 34/74/2595 40.3  39.0 - 52.0 % Final   MCV 04/28/2022 93.1  80.0 - 100.0 fL Final   MCH 04/28/2022 30.9  26.0 - 34.0 pg Final   MCHC 04/28/2022 33.3  30.0 - 36.0 g/dL Final   RDW 63/87/5643 13.4  11.5 - 15.5 % Final   Platelets 04/28/2022 238  150 - 400 K/uL Final   nRBC 04/28/2022 0.0  0.0 - 0.2 % Final   Performed at [facility], [address], [place], [place] [address]   Opiates 04/28/2022 NONE DETECTED  NONE DETECTED Final   Cocaine 04/28/2022 NONE DETECTED  NONE DETECTED Final   Benzodiazepines 04/28/2022 NONE DETECTED  NONE DETECTED Final   Amphetamines 04/28/2022 NONE DETECTED  NONE DETECTED Final   Tetrahydrocannabinol 04/28/2022 POSITIVE (A)  NONE DETECTED Final   Barbiturates 04/28/2022 NONE DETECTED  NONE DETECTED Final   Comment: (NOTE) DRUG SCREEN FOR MEDICAL PURPOSES ONLY.  IF CONFIRMATION IS NEEDED FOR ANY PURPOSE, NOTIFY LAB WITHIN 5 DAYS.  LOWEST DETECTABLE LIMITS FOR URINE DRUG SCREEN Drug Class                     Cutoff (ng/mL) Amphetamine and metabolites    1000 Barbiturate and metabolites    200 Benzodiazepine                 200 Tricyclics and metabolites     300 Opiates and metabolites        300 Cocaine and metabolites        300 THC  50 Performed at [facility], [address] [place], [place] [address]    Color, Urine 04/28/2022 YELLOW  YELLOW Final   APPearance 04/28/2022 CLEAR  CLEAR Final   Specific Gravity, Urine 04/28/2022 1.025  1.005 - 1.030 Final   pH 04/28/2022 5.0  5.0 - 8.0 Final   Glucose, UA 04/28/2022 NEGATIVE  NEGATIVE mg/dL Final   Hgb urine  dipstick 04/28/2022 SMALL (A)  NEGATIVE Final   Bilirubin Urine 04/28/2022 NEGATIVE  NEGATIVE Final   Ketones, ur 04/28/2022 NEGATIVE  NEGATIVE mg/dL Final   Protein, ur 78/24/2353 NEGATIVE  NEGATIVE mg/dL Final   Nitrite 61/44/3154 NEGATIVE  NEGATIVE Final   Leukocytes,Ua 04/28/2022 NEGATIVE  NEGATIVE Final   RBC / HPF 04/28/2022 0-5  0 - 5 RBC/hpf Final   WBC, UA 04/28/2022 6-10  0 - 5 WBC/hpf Final   Bacteria, UA 04/28/2022 NONE SEEN  NONE SEEN Final   Squamous Epithelial / LPF 04/28/2022 0-5  0 - 5 Final   Mucus 04/28/2022 PRESENT   Final   Performed at [facility], [address], [place], [place] [address]   Troponin I (High Sensitivity) 04/28/2022 6  <18 ng/L Final   Comment: (NOTE) Elevated high sensitivity troponin I (hsTnI) values and significant  changes across serial measurements may suggest ACS but many other  chronic and acute conditions are known to elevate hsTnI results.  Refer to the "Links" section for chest pain algorithms and additional  guidance. Performed at [facility], [address], [place], [place] [address]   Admission on 04/25/2022, Discharged on 04/26/2022  Component Date Value Ref Range Status   Sodium 04/25/2022 137  135 - 145 mmol/L Final   Potassium 04/25/2022 3.2 (L)  3.5 - 5.1 mmol/L Final   Chloride 04/25/2022 107  98 - 111 mmol/L Final   CO2 04/25/2022 18 (L)  22 - 32 mmol/L Final   Glucose, Bld 04/25/2022 98  70 - 99 mg/dL Final   Glucose reference range applies only to samples taken after fasting for at least 8 hours.   BUN 04/25/2022 15  6 - 20 mg/dL Final   Creatinine, Ser 04/25/2022 1.09  0.61 - 1.24 mg/dL Final   Calcium 93/26/7124 8.8 (L)  8.9 - 10.3 mg/dL Final   GFR, Estimated 04/25/2022 >60  >60 mL/min Final   Comment: (NOTE) Calculated using the CKD-EPI Creatinine Equation (2021)    Anion gap 04/25/2022 12  5 - 15 Final   Performed at [facility], [address], [place], [place] [address]   WBC 04/25/2022  4.1  4.0 - 10.5 K/uL Final   RBC 04/25/2022 4.37  4.22 - 5.81 MIL/uL Final   Hemoglobin 04/25/2022 13.6  13.0 - 17.0 g/dL Final   HCT 83/38/2505 40.6  39.0 - 52.0 % Final   MCV 04/25/2022 92.9  80.0 - 100.0 fL Final   MCH 04/25/2022 31.1  26.0 - 34.0 pg Final   MCHC 04/25/2022 33.5  30.0 - 36.0 g/dL Final   RDW 39/76/7341 13.5  11.5 - 15.5 % Final   Platelets 04/25/2022 255  150 - 400 K/uL Final   nRBC 04/25/2022 0.0  0.0 - 0.2 % Final   Performed at [facility], [address], [place], [place] [address]   Troponin I (High Sensitivity) 04/25/2022 6  <18 ng/L Final   Comment: (NOTE) Elevated high sensitivity troponin I (hsTnI) values and significant  changes across serial measurements may suggest ACS but  many other  chronic and acute conditions are known to elevate hsTnI results.  Refer to the "Links" section for chest pain algorithms and additional  guidance. Performed at [facility], [address], [place], [place] [address]    Troponin I (High Sensitivity) 04/26/2022 6  <18 ng/L Final   Comment: (NOTE) Elevated high sensitivity troponin I (hsTnI) values and significant  changes across serial measurements may suggest ACS but many other  chronic and acute conditions are known to elevate hsTnI results.  Refer to the "Links" section for chest pain algorithms and additional  guidance. Performed at [facility], [address], [place], [place] [address]   Admission on 03/24/2022, Discharged on 03/25/2022  Component Date Value Ref Range Status   Sodium 03/24/2022 140  135 - 145 mmol/L Final   Potassium 03/24/2022 3.8  3.5 - 5.1 mmol/L Final   Chloride 03/24/2022 108  98 - 111 mmol/L Final   CO2 03/24/2022 22  22 - 32 mmol/L Final   Glucose, Bld 03/24/2022 80  70 - 99 mg/dL Final   Glucose reference range applies only to samples taken after fasting for at least 8 hours.   BUN 03/24/2022 13  6 - 20 mg/dL Final   Creatinine, Ser 03/24/2022 1.13  0.61 - 1.24  mg/dL Final   Calcium 56/21/3086 9.0  8.9 - 10.3 mg/dL Final   Total Protein 57/84/6962 8.1  6.5 - 8.1 g/dL Final   Albumin 95/28/4132 3.6  3.5 - 5.0 g/dL Final   AST 44/10/270 119 (H)  15 - 41 U/L Final   ALT 03/24/2022 70 (H)  0 - 44 U/L Final   Alkaline Phosphatase 03/24/2022 57  38 - 126 U/L Final   Total Bilirubin 03/24/2022 0.9  0.3 - 1.2 mg/dL Final   GFR, Estimated 03/24/2022 >60  >60 mL/min Final   Comment: (NOTE) Calculated using the CKD-EPI Creatinine Equation (2021)    Anion gap 03/24/2022 10  5 - 15 Final   Performed at [facility], [address] [place], [place] [address]   Alcohol, Ethyl (B) 03/24/2022 131 (H)  <10 mg/dL Final   Comment: (NOTE) Lowest detectable limit for serum alcohol is 10 mg/dL.  For medical purposes only. Performed at [facility], [address], [place], [place] [address]    Salicylate Lvl 03/24/2022 <7.0 (L)  7.0 - 30.0 mg/dL Final   Performed at [facility], [address], [place], [place] [address]   Acetaminophen (Tylenol), Serum 03/24/2022 <10 (L)  10 - 30 ug/mL Final   Comment: (NOTE) Therapeutic concentrations vary significantly. A range of 10-30 ug/mL  may be an effective concentration for many patients. However, some  are best treated at concentrations outside of this range. Acetaminophen concentrations >150 ug/mL at 4 hours after ingestion  and >50 ug/mL at 12 hours after ingestion are often associated with  toxic reactions.  Performed at [facility], [address], [place], [place] [address]    WBC 03/24/2022 3.8 (L)  4.0 - 10.5 K/uL Final   RBC 03/24/2022 4.67  4.22 - 5.81 MIL/uL Final   Hemoglobin 03/24/2022 14.4  13.0 - 17.0 g/dL Final   HCT 64/33/2951 44.6  39.0 - 52.0 % Final   MCV 03/24/2022 95.5  80.0 - 100.0 fL Final   MCH 03/24/2022 30.8  26.0 - 34.0 pg Final   MCHC 03/24/2022 32.3  30.0 - 36.0 g/dL Final   RDW 88/41/6606 14.8  11.5 - 15.5 % Final   Platelets 03/24/2022 275  150 - 400  K/uL Final   nRBC 03/24/2022 0.0  0.0 - 0.2 % Final   Performed at [facility], [address] [place], [place] [address]   Opiates 03/25/2022 NONE DETECTED  NONE DETECTED Final   Cocaine 03/25/2022 NONE DETECTED  NONE DETECTED Final   Benzodiazepines 03/25/2022 NONE DETECTED  NONE DETECTED Final   Amphetamines 03/25/2022 NONE DETECTED  NONE DETECTED Final   Tetrahydrocannabinol 03/25/2022 POSITIVE (A)  NONE DETECTED Final   Barbiturates 03/25/2022 NONE DETECTED  NONE DETECTED Final   Comment: (NOTE) DRUG SCREEN FOR MEDICAL PURPOSES ONLY.  IF CONFIRMATION IS NEEDED FOR ANY PURPOSE, NOTIFY LAB WITHIN 5 DAYS.  LOWEST DETECTABLE LIMITS FOR URINE DRUG SCREEN Drug Class                     Cutoff (ng/mL) Amphetamine and metabolites    1000 Barbiturate and metabolites    200 Benzodiazepine                 200 Tricyclics and metabolites     300 Opiates and metabolites        300 Cocaine and metabolites        300 THC                            50 Performed at [facility], [address] [place], [place] [address]   Admission on 03/02/2022, Discharged on 03/03/2022  Component Date Value Ref Range Status   Sodium 03/02/2022 137  135 - 145 mmol/L Final   Potassium 03/02/2022 3.3 (L)  3.5 - 5.1 mmol/L Final   Chloride 03/02/2022 105  98 - 111 mmol/L Final   CO2 03/02/2022 20 (L)  22 - 32 mmol/L Final   Glucose, Bld 03/02/2022 87  70 - 99 mg/dL Final   Glucose reference range applies only to samples taken after fasting for at least 8 hours.   BUN 03/02/2022 10  6 - 20 mg/dL Final   Creatinine, Ser 03/02/2022 0.94  0.61 - 1.24 mg/dL Final   Calcium 09/81/191405/14/2023 9.1  8.9 - 10.3 mg/dL Final   Total Protein 78/29/562105/14/2023 8.5 (H)  6.5 - 8.1 g/dL Final   Albumin 30/86/578405/14/2023 3.8  3.5 - 5.0 g/dL Final   AST 69/62/952805/14/2023 41  15 - 41 U/L Final   ALT 03/02/2022 38  0 - 44 U/L Final   Alkaline Phosphatase 03/02/2022 61  38 - 126 U/L Final   Total Bilirubin 03/02/2022 1.1  0.3 - 1.2 mg/dL  Final   GFR, Estimated 03/02/2022 >60  >60 mL/min Final   Comment: (NOTE) Calculated using the CKD-EPI Creatinine Equation (2021)    Anion gap 03/02/2022 12  5 - 15 Final   Performed at [facility], [address] [place], [place] [address]   WBC 03/02/2022 4.9  4.0 - 10.5 K/uL Final   RBC 03/02/2022 4.47  4.22 - 5.81 MIL/uL Final   Hemoglobin 03/02/2022 14.4  13.0 - 17.0 g/dL Final   HCT 40/10/272505/14/2023 42.8  39.0 - 52.0 % Final   MCV 03/02/2022 95.7  80.0 - 100.0 fL Final   MCH 03/02/2022 32.2  26.0 - 34.0 pg Final   MCHC 03/02/2022 33.6  30.0 - 36.0 g/dL Final   RDW 36/64/403405/14/2023 15.8 (H)  11.5 - 15.5 % Final   Platelets 03/02/2022 310  150 - 400 K/uL Final   nRBC 03/02/2022 0.0  0.0 - 0.2 % Final   Neutrophils Relative % 03/02/2022 39  % Final   Neutro Abs 03/02/2022 1.9  1.7 - 7.7 K/uL Final   Lymphocytes Relative 03/02/2022 41  % Final   Lymphs Abs 03/02/2022 1.9  0.7 - 4.0 K/uL Final   Monocytes Relative 03/02/2022 12  % Final   Monocytes Absolute 03/02/2022 0.6  0.1 - 1.0 K/uL Final   Eosinophils Relative 03/02/2022 7  % Final   Eosinophils Absolute 03/02/2022 0.4  0.0 - 0.5 K/uL Final   Basophils Relative 03/02/2022 1  % Final   Basophils Absolute 03/02/2022 0.0  0.0 - 0.1 K/uL Final   Immature Granulocytes 03/02/2022 0  % Final   Abs Immature Granulocytes 03/02/2022 0.01  0.00 - 0.07 K/uL Final   Performed at [facility], [address], [place], [place] [address]   Alcohol, Ethyl (B) 03/02/2022 90 (H)  <10 mg/dL Final   Comment: (NOTE) Lowest detectable limit for serum alcohol is 10 mg/dL.  For medical purposes only. Performed at [facility], [address] [place], [place] [address]    Acetaminophen (Tylenol), Serum 03/02/2022 <10 (L)  10 - 30 ug/mL Final   Comment: (NOTE) Therapeutic concentrations vary significantly. A range of 10-30 ug/mL  may be an effective concentration for many patients. However, some  are best treated at concentrations  outside of this range. Acetaminophen concentrations >150 ug/mL at 4 hours after ingestion  and >50 ug/mL at 12 hours after ingestion are often associated with  toxic reactions.  Performed at [facility], [address], [place], [place] [address]    Salicylate Lvl 03/02/2022 <7.0 (L)  7.0 - 30.0 mg/dL Final   Performed at [facility], [address], [place], [place] [address]  Admission on 03/01/2022, Discharged on 03/01/2022  Component Date Value Ref Range Status   Sodium 03/01/2022 139  135 - 145 mmol/L Final   Potassium 03/01/2022 3.9  3.5 - 5.1 mmol/L Final   Chloride 03/01/2022 106  98 - 111 mmol/L Final   CO2 03/01/2022 21 (L)  22 - 32 mmol/L Final   Glucose, Bld 03/01/2022 83  70 - 99 mg/dL Final   Glucose reference range applies only to samples taken after fasting for at least 8 hours.   BUN 03/01/2022 19  6 - 20 mg/dL Final   Creatinine, Ser 03/01/2022 1.01  0.61 - 1.24 mg/dL Final   Calcium 03/47/4259 8.9  8.9 - 10.3 mg/dL Final   Total Protein 56/38/7564 8.7 (H)  6.5 - 8.1 g/dL Final   Albumin 33/29/5188 3.9  3.5 - 5.0 g/dL Final   AST 41/66/0630 42 (H)  15 - 41 U/L Final   ALT 03/01/2022 36  0 - 44 U/L Final   Alkaline Phosphatase 03/01/2022 59  38 - 126 U/L Final   Total Bilirubin 03/01/2022 0.9  0.3 - 1.2 mg/dL Final   GFR, Estimated 03/01/2022 >60  >60 mL/min Final   Comment: (NOTE) Calculated using the CKD-EPI Creatinine Equation (2021)    Anion gap 03/01/2022 12  5 - 15 Final   Performed at [facility], [address] [place], [place] [address]   Alcohol, Ethyl (B) 03/01/2022 105 (H)  <10 mg/dL Final   Comment: (NOTE) Lowest detectable limit for serum alcohol is 10 mg/dL.  For medical purposes only. Performed at [facility], [address] [place], [place] [address]    Salicylate Lvl 03/01/2022 <7.0 (L)  7.0 - 30.0 mg/dL Final   Performed at [facility], [address]  [place], [place] [address]   Acetaminophen (Tylenol), Serum 03/01/2022 <10 (L)  10 - 30 ug/mL Final   Comment: (NOTE) Therapeutic concentrations vary significantly. A range of 10-30 ug/mL  may be an effective concentration for many patients. However, some  are best treated at concentrations outside of this range. Acetaminophen concentrations >150 ug/mL at 4 hours after ingestion  and >50 ug/mL at 12 hours after ingestion are often associated with  toxic reactions.  Performed at [facility], [address] [place], [place] [address]    WBC 03/01/2022 5.3  4.0 - 10.5 K/uL Final   RBC 03/01/2022 4.46  4.22 - 5.81 MIL/uL Final   Hemoglobin 03/01/2022 14.2  13.0 - 17.0 g/dL Final   HCT 91/47/8295 42.3  39.0 - 52.0 % Final   MCV 03/01/2022 94.8  80.0 - 100.0 fL Final   MCH 03/01/2022 31.8  26.0 - 34.0 pg Final   MCHC 03/01/2022 33.6  30.0 - 36.0 g/dL Final   RDW 62/13/0865 15.9 (H)  11.5 - 15.5 % Final   Platelets 03/01/2022 311  150 - 400 K/uL Final   nRBC 03/01/2022 0.0  0.0 - 0.2 % Final   Performed at [facility], [address] [place], [place] [address]   Opiates 03/01/2022 NONE DETECTED  NONE DETECTED Final   Cocaine 03/01/2022 NONE DETECTED  NONE DETECTED Final   Benzodiazepines 03/01/2022 NONE DETECTED  NONE DETECTED Final   Amphetamines 03/01/2022 NONE DETECTED  NONE DETECTED Final   Tetrahydrocannabinol 03/01/2022 POSITIVE (A)  NONE DETECTED Final   Barbiturates 03/01/2022 NONE DETECTED  NONE DETECTED Final   Comment: (NOTE) DRUG SCREEN FOR MEDICAL PURPOSES ONLY.  IF CONFIRMATION IS NEEDED FOR ANY PURPOSE, NOTIFY LAB WITHIN 5 DAYS.  LOWEST DETECTABLE LIMITS FOR URINE DRUG SCREEN Drug Class                     Cutoff (ng/mL) Amphetamine and metabolites    1000 Barbiturate and metabolites    200 Benzodiazepine                 200 Tricyclics and metabolites     300 Opiates and metabolites        300 Cocaine and metabolites         300 THC                            50 Performed at [facility], [address] [place], [place] [address]   Admission on 02/27/2022, Discharged on 02/27/2022  Component Date Value Ref Range Status   SARS Coronavirus 2 by RT PCR 02/27/2022 NEGATIVE  NEGATIVE Final   Comment: (NOTE) SARS-CoV-2 target nucleic acids are NOT DETECTED.  The SARS-CoV-2 RNA is generally detectable in upper respiratory specimens during the acute phase of infection. The lowest concentration of SARS-CoV-2 viral copies this assay can detect is 138 copies/mL. A negative result does not preclude SARS-Cov-2 infection and should not be used as the sole basis for treatment or other patient management decisions. A negative result may occur with  improper specimen collection/handling, submission of specimen other than nasopharyngeal swab, presence of viral mutation(s) within the areas targeted by this assay, and inadequate number of viral copies(<138 copies/mL). A negative result  must be combined with clinical observations, patient history, and epidemiological information. The expected result is Negative.  Fact Sheet for Patients:  BloggerCourse.com  Fact Sheet for Healthcare Providers:  SeriousBroker.it  This test is no                          t yet approved or cleared by the [place] FDA and  has been authorized for detection and/or diagnosis of SARS-CoV-2 by FDA under an Emergency Use Authorization (EUA). This EUA will remain  in effect (meaning this test can be used) for the duration of the COVID-19 declaration under Section 564(b)(1) of the Act, 21 U.S.C.section 360bbb-3(b)(1), unless the authorization is terminated  or revoked sooner.       Influenza A by PCR 02/27/2022 NEGATIVE  NEGATIVE Final   Influenza B by PCR 02/27/2022 NEGATIVE  NEGATIVE Final   Comment: (NOTE) The Xpert Xpress SARS-CoV-2/FLU/RSV plus assay is intended  as an aid in the diagnosis of influenza from Nasopharyngeal swab specimens and should not be used as a sole basis for treatment. Nasal washings and aspirates are unacceptable for Xpert Xpress SARS-CoV-2/FLU/RSV testing.  Fact Sheet for Patients: BloggerCourse.com  Fact Sheet for Healthcare Providers: SeriousBroker.it  This test is not yet approved or cleared by the [place] FDA and has been authorized for detection and/or diagnosis of SARS-CoV-2 by FDA under an Emergency Use Authorization (EUA). This EUA will remain in effect (meaning this test can be used) for the duration of the COVID-19 declaration under Section 564(b)(1) of the Act, 21 U.S.C. section 360bbb-3(b)(1), unless the authorization is terminated or revoked.  Performed at [facility], [address] [place], [place] [address]    WBC 02/27/2022 4.6  4.0 - 10.5 K/uL Final   RBC 02/27/2022 4.44  4.22 - 5.81 MIL/uL Final   Hemoglobin 02/27/2022 14.2  13.0 - 17.0 g/dL Final   HCT 52/84/1324 41.3  39.0 - 52.0 % Final   MCV 02/27/2022 93.0  80.0 - 100.0 fL Final   MCH 02/27/2022 32.0  26.0 - 34.0 pg Final   MCHC 02/27/2022 34.4  30.0 - 36.0 g/dL Final   RDW 40/07/2724 15.7 (H)  11.5 - 15.5 % Final   Platelets 02/27/2022 293  150 - 400 K/uL Final   nRBC 02/27/2022 0.0  0.0 - 0.2 % Final   Neutrophils Relative % 02/27/2022 44  % Final   Neutro Abs 02/27/2022 2.0  1.7 - 7.7 K/uL Final   Lymphocytes Relative 02/27/2022 43  % Final   Lymphs Abs 02/27/2022 2.0  0.7 - 4.0 K/uL Final   Monocytes Relative 02/27/2022 8  % Final   Monocytes Absolute 02/27/2022 0.4  0.1 - 1.0 K/uL Final   Eosinophils Relative 02/27/2022 4  % Final   Eosinophils Absolute 02/27/2022 0.2  0.0 - 0.5 K/uL Final   Basophils Relative 02/27/2022 1  % Final   Basophils Absolute 02/27/2022 0.0  0.0 - 0.1 K/uL Final   Immature Granulocytes 02/27/2022 0  % Final   Abs Immature Granulocytes  02/27/2022 0.02  0.00 - 0.07 K/uL Final   Performed at [facility], [address] [place], [place] [address]   Sodium 02/27/2022 140  135 - 145 mmol/L Final   Potassium 02/27/2022 4.1  3.5 - 5.1 mmol/L Final   Chloride 02/27/2022 106  98 - 111 mmol/L Final   CO2 02/27/2022 28  22 - 32 mmol/L Final   Glucose, Bld  02/27/2022 91  70 - 99 mg/dL Final   Glucose reference range applies only to samples taken after fasting for at least 8 hours.   BUN 02/27/2022 6  6 - 20 mg/dL Final   Creatinine, Ser 02/27/2022 1.00  0.61 - 1.24 mg/dL Final   Calcium 16/07/9603 9.0  8.9 - 10.3 mg/dL Final   Total Protein 54/06/8118 7.9  6.5 - 8.1 g/dL Final   Albumin 14/78/2956 3.7  3.5 - 5.0 g/dL Final   AST 21/30/8657 36  15 - 41 U/L Final   ALT 02/27/2022 30  0 - 44 U/L Final   Alkaline Phosphatase 02/27/2022 63  38 - 126 U/L Final   Total Bilirubin 02/27/2022 0.8  0.3 - 1.2 mg/dL Final   GFR, Estimated 02/27/2022 >60  >60 mL/min Final   Comment: (NOTE) Calculated using the CKD-EPI Creatinine Equation (2021)    Anion gap 02/27/2022 6  5 - 15 Final   Performed at [facility], [address] [place], [place] [address]   Alcohol, Ethyl (B) 02/27/2022 104 (H)  <10 mg/dL Final   Comment: (NOTE) Lowest detectable limit for serum alcohol is 10 mg/dL.  For medical purposes only. Performed at [facility], [address] [place], [place] [address]    Magnesium 02/27/2022 2.2  1.7 - 2.4 mg/dL Final   Performed at [facility], [address] [place], [place] [address]   TSH 02/27/2022 2.170  0.350 - 4.500 uIU/mL Final   Comment: Performed by a 3rd Generation assay with a functional sensitivity of <=0.01 uIU/mL. Performed at [facility], [address] [place], [place] [address]    POC Amphetamine UR 02/27/2022 None Detected  NONE DETECTED (Cut Off Level 1000 ng/mL) Final   POC Secobarbital (BAR) 02/27/2022 None Detected  NONE DETECTED (Cut Off Level 300 ng/mL) Final    POC Buprenorphine (BUP) 02/27/2022 None Detected  NONE DETECTED (Cut Off Level 10 ng/mL) Final   POC Oxazepam (BZO) 02/27/2022 None Detected  NONE DETECTED (Cut Off Level 300 ng/mL) Final   POC Cocaine UR 02/27/2022 None Detected  NONE DETECTED (Cut Off Level 300 ng/mL) Final   POC Methamphetamine UR 02/27/2022 None Detected  NONE DETECTED (Cut Off Level 1000 ng/mL) Final   POC Morphine 02/27/2022 None Detected  NONE DETECTED (Cut Off Level 300 ng/mL) Final   POC Oxycodone UR 02/27/2022 None Detected  NONE DETECTED (Cut Off Level 100 ng/mL) Final   POC Methadone UR 02/27/2022 None Detected  NONE DETECTED (Cut Off Level 300 ng/mL) Final   POC Marijuana UR 02/27/2022 Positive (A)  NONE DETECTED (Cut Off Level 50 ng/mL) Final   SARSCOV2ONAVIRUS 2 AG 02/27/2022 NEGATIVE  NEGATIVE Final   Comment: (NOTE) SARS-CoV-2 antigen NOT DETECTED.   Negative results are presumptive.  Negative results do not preclude SARS-CoV-2 infection and should not be used as the sole basis for treatment or other patient management decisions, including infection  control decisions, particularly in the presence of clinical signs and  symptoms consistent with COVID-19, or in those who have been in contact with the virus.  Negative results must be combined with clinical observations, patient history, and epidemiological information. The expected result is Negative.  Fact Sheet for Patients: https://www.jennings-kim.com/  Fact Sheet for Healthcare Providers: https://alexander-rogers.biz/  This test is not yet approved or cleared by the [place] FDA and  has been authorized for detection and/or diagnosis of SARS-CoV-2 by FDA under an Emergency Use Authorization (EUA).  This EUA will remain in effect (meaning this test can be used) for the duration of  the COV                          ID-19 declaration under Section 564(b)(1) of the Act, 21 U.S.C. section 360bbb-3(b)(1), unless the  authorization is terminated or revoked sooner.    Admission on 02/24/2022, Discharged on 02/25/2022  Component Date Value Ref Range Status   Sodium 02/24/2022 140  135 - 145 mmol/L Final   Potassium 02/24/2022 3.0 (L)  3.5 - 5.1 mmol/L Final   Chloride 02/24/2022 110  98 - 111 mmol/L Final   CO2 02/24/2022 20 (L)  22 - 32 mmol/L Final   Glucose, Bld 02/24/2022 87  70 - 99 mg/dL Final   Glucose reference range applies only to samples taken after fasting for at least 8 hours.   BUN 02/24/2022 13  6 - 20 mg/dL Final   Creatinine, Ser 02/24/2022 1.30 (H)  0.61 - 1.24 mg/dL Final   Calcium 16/07/9603 8.8 (L)  8.9 - 10.3 mg/dL Final   Total Protein 54/06/8118 8.5 (H)  6.5 - 8.1 g/dL Final   Albumin 14/78/2956 3.8  3.5 - 5.0 g/dL Final   AST 21/30/8657 37  15 - 41 U/L Final   ALT 02/24/2022 33  0 - 44 U/L Final   Alkaline Phosphatase 02/24/2022 60  38 - 126 U/L Final   Total Bilirubin 02/24/2022 0.7  0.3 - 1.2 mg/dL Final   GFR, Estimated 02/24/2022 >60  >60 mL/min Final   Comment: (NOTE) Calculated using the CKD-EPI Creatinine Equation (2021)    Anion gap 02/24/2022 10  5 - 15 Final   Performed at [facility], [address] [place], [place] [address]   Alcohol, Ethyl (B) 02/24/2022 277 (H)  <10 mg/dL Final   Comment: (NOTE) Lowest detectable limit for serum alcohol is 10 mg/dL.  For medical purposes only. Performed at [facility], [address], [place], [place] [address]    WBC 02/24/2022 5.9  4.0 - 10.5 K/uL Final   RBC 02/24/2022 4.44  4.22 - 5.81 MIL/uL Final   Hemoglobin 02/24/2022 14.3  13.0 - 17.0 g/dL Final   HCT 41/32/4401 41.9  39.0 - 52.0 % Final   MCV 02/24/2022 94.4  80.0 - 100.0 fL Final   MCH 02/24/2022 32.2  26.0 - 34.0 pg Final   MCHC 02/24/2022 34.1  30.0 - 36.0 g/dL Final   RDW 02/72/5366 15.7 (H)  11.5 - 15.5 % Final   Platelets 02/24/2022 286  150 - 400 K/uL Final   nRBC 02/24/2022 0.0  0.0 - 0.2 % Final    Performed at [facility], [address], [place], [place] [address]   Opiates 02/24/2022 NONE DETECTED  NONE DETECTED Final   Cocaine 02/24/2022 NONE DETECTED  NONE DETECTED Final   Benzodiazepines 02/24/2022 NONE DETECTED  NONE DETECTED Final   Amphetamines 02/24/2022 NONE DETECTED  NONE DETECTED Final   Tetrahydrocannabinol 02/24/2022 POSITIVE (A)  NONE DETECTED Final   Barbiturates 02/24/2022 NONE DETECTED  NONE DETECTED Final   Comment: (NOTE) DRUG SCREEN FOR MEDICAL PURPOSES ONLY.  IF CONFIRMATION IS NEEDED FOR ANY PURPOSE, NOTIFY LAB WITHIN 5 DAYS.  LOWEST DETECTABLE LIMITS FOR URINE DRUG SCREEN Drug Class                     Cutoff (ng/mL) Amphetamine and metabolites  1000 Barbiturate and metabolites    200 Benzodiazepine                 200 Tricyclics and metabolites     300 Opiates and metabolites        300 Cocaine and metabolites        300 THC                            50 Performed at [facility], [address] [place], [place] [address]    SARS Coronavirus 2 by RT PCR 02/24/2022 NEGATIVE  NEGATIVE Final   Comment: (NOTE) SARS-CoV-2 target nucleic acids are NOT DETECTED.  The SARS-CoV-2 RNA is generally detectable in upper respiratory specimens during the acute phase of infection. The lowest concentration of SARS-CoV-2 viral copies this assay can detect is 138 copies/mL. A negative result does not preclude SARS-Cov-2 infection and should not be used as the sole basis for treatment or other patient management decisions. A negative result may occur with  improper specimen collection/handling, submission of specimen other than nasopharyngeal swab, presence of viral mutation(s) within the areas targeted by this assay, and inadequate number of viral copies(<138 copies/mL). A negative result must be combined with clinical observations, patient history, and epidemiological information. The expected result is Negative.  Fact  Sheet for Patients:  BloggerCourse.com  Fact Sheet for Healthcare Providers:  SeriousBroker.it  This test is no                          t yet approved or cleared by the [place] FDA and  has been authorized for detection and/or diagnosis of SARS-CoV-2 by FDA under an Emergency Use Authorization (EUA). This EUA will remain  in effect (meaning this test can be used) for the duration of the COVID-19 declaration under Section 564(b)(1) of the Act, 21 U.S.C.section 360bbb-3(b)(1), unless the authorization is terminated  or revoked sooner.       Influenza A by PCR 02/24/2022 NEGATIVE  NEGATIVE Final   Influenza B by PCR 02/24/2022 NEGATIVE  NEGATIVE Final   Comment: (NOTE) The Xpert Xpress SARS-CoV-2/FLU/RSV plus assay is intended as an aid in the diagnosis of influenza from Nasopharyngeal swab specimens and should not be used as a sole basis for treatment. Nasal washings and aspirates are unacceptable for Xpert Xpress SARS-CoV-2/FLU/RSV testing.  Fact Sheet for Patients: BloggerCourse.com  Fact Sheet for Healthcare Providers: SeriousBroker.it  This test is not yet approved or cleared by the [place] FDA and has been authorized for detection and/or diagnosis of SARS-CoV-2 by FDA under an Emergency Use Authorization (EUA). This EUA will remain in effect (meaning this test can be used) for the duration of the COVID-19 declaration under Section 564(b)(1) of the Act, 21 U.S.C. section 360bbb-3(b)(1), unless the authorization is terminated or revoked.  Performed at [facility], [address], [place], [place] [address]    Salicylate Lvl 02/24/2022 <7.0 (L)  7.0 - 30.0 mg/dL Final   Performed at [facility], [address], [place], [place] [address]   Acetaminophen (Tylenol), Serum 02/24/2022 <10 (L)  10 - 30 ug/mL Final   Comment:  (NOTE) Therapeutic concentrations vary significantly. A range of 10-30 ug/mL  may be an effective concentration for many patients. However, some  are best treated at concentrations outside of this range. Acetaminophen concentrations >150 ug/mL at 4 hours after ingestion  and >50 ug/mL at 12 hours after ingestion are often associated with  toxic reactions.  Performed at [facility], [address] [place], [place] [address]   There may be more visits with results that are not included.    Blood Alcohol level:  Lab Results  Component Value Date   ETH 146 (H) 05/03/2022   ETH 43 (H) 05/01/2022    Metabolic Disorder Labs: No results found for: "HGBA1C", "MPG" No results found for: "PROLACTIN" No results found for: "CHOL", "TRIG", "HDL", "CHOLHDL", "VLDL", "LDLCALC"  Therapeutic Lab Levels: No results found for: "LITHIUM" No results found for: "VALPROATE" No results found for: "CBMZ"  Physical Findings   PHQ2-9    Flowsheet Row ED from 08/11/2021 in [facility] EMERGENCY DEPART[facility]  PHQ-2 Total Score 0      Flowsheet Row ED from 05/03/2022 in [facility] ED from 05/01/2022 in [facility] from 04/28/2022 in [facility] EMERGENCY DEPARTMENT  C-SSRS RISK CATEGORY High Risk No Risk No Risk        Musculoskeletal  Strength & Muscle Tone: within normal limits Gait & Station: normal Patient leans: N/A  Psychiatric Specialty Exam  Presentation  General Appearance: Casual  Eye Contact:Good  Speech:Clear and Coherent; Normal Rate  Speech Volume:Normal  Handedness:Right   Mood and Affect  Mood:Depressed; Anxious  Affect:Congruent   Thought Process  Thought Processes:Coherent  Descriptions of Associations:Intact  Orientation:Full (Time, Place and Person)  Thought Content:Logical  Diagnosis of Schizophrenia or Schizoaffective disorder in  past: No    Hallucinations:Hallucinations: None  Ideas of Reference:None  Suicidal Thoughts:Suicidal Thoughts: Yes, Passive SI Active Intent and/or Plan: With Plan; With Intent SI Passive Intent and/or Plan: With Intent; Without Plan; Without Means to Carry Out  Homicidal Thoughts:Homicidal Thoughts: No   Sensorium  Memory:Immediate Good; Recent Good; Remote Good  Judgment:Fair  Insight:Fair   Executive Functions  Concentration:Good  Attention Span:Good  Recall:Good  Fund of Knowledge:Good  Language:Good   Psychomotor Activity  Psychomotor Activity:Psychomotor Activity: Normal   Assets  Assets:Communication Skills; Desire for Improvement; Physical Health; Resilience; Social Support   Sleep  Sleep:Sleep: Fair Number of Hours of Sleep: 7   Nutritional Assessment (For OBS and FBC admissions only) Has the patient had a weight loss or gain of 10 pounds or more in the last 3 months?: No Has the patient had a decrease in food intake/or appetite?: No Does the patient have dental problems?: No Does the patient have eating habits or behaviors that may be indicators of an eating disorder including binging or inducing vomiting?: No Has the patient recently lost weight without trying?: 0 Has the patient been eating poorly because of a decreased appetite?: 0 Malnutrition Screening Tool Score: 0    Physical Exam  Physical Exam Vitals and nursing note reviewed.  Constitutional:      General: He is not in acute distress.    Appearance: Normal appearance. He is well-developed.  HENT:     Head: Normocephalic and atraumatic.  Eyes:     General:        Right eye: No discharge.        Left eye: No discharge.     Conjunctiva/sclera: Conjunctivae normal.  Cardiovascular:     Rate and Rhythm: Normal rate.  Pulmonary:     Effort: Pulmonary effort is normal.  Musculoskeletal:        General: Normal range of motion.     Cervical  back: Normal range of motion.   Neurological:     Mental Status: He is alert and oriented to person, place, and time.  Psychiatric:        Attention and Perception: Attention and perception normal.        Mood and Affect: Mood is anxious and depressed.        Speech: Speech normal.        Behavior: Behavior normal. Behavior is cooperative.        Thought Content: Thought content does not include suicidal ideation. Thought content does not include suicidal plan.        Cognition and Memory: Cognition normal.        Judgment: Judgment normal.    Review of Systems  Constitutional: Negative.   HENT: Negative.    Eyes: Negative.   Respiratory: Negative.    Cardiovascular: Negative.   Musculoskeletal: Negative.   Skin: Negative.   Neurological: Negative.   Psychiatric/Behavioral:  Positive for depression and suicidal ideas. The patient is nervous/anxious.    Blood pressure (!) 105/55, pulse 88, temperature 98 F (36.7 C), temperature source Oral, resp. rate 18, SpO2 98 %. There is no height or weight on file to calculate BMI.  Treatment Plan Summary:  Disposition: Ongoing.  Patient is seeking residential substance abuse treatment.  Social work will be notified to seek placement.  We will continue to have daily contact with patient to assess and evaluate symptoms and progress in treatment and Medication management.   Lexapro 10 mg daily initiated for depression/anxiety.    Ardis Hughs, NP 05/03/2022 1:21 PM

## 2022-05-03 NOTE — ED Triage Notes (Signed)
Pt presents to [facility] voluntarily, accompanied by [employer]. Upon arrival, pt states " they said if I get fucked up or messed up I can come back". Pt immediately asks for food and reports trying to get a bed across the street, but was unsuccessful. Pt is observed intoxicated and was picked up by [employer] on [facility]. Pt says he was ramming his head into a building and the police brought him here. Pt has no visible signs of injury at this time. Pt reports drinking a gallon of alcohol tonight. Pt states " I just need some help". Pt was discharged from [facility] 05/02/22. Pt denies HI, AVH.

## 2022-05-03 NOTE — ED Notes (Signed)
Pt resting in bed. A&O x4, calm and cooperative. Denies current SI/HI/AVH. No signs of acute distress noted. Will continue to monitor for safety.  

## 2022-05-04 ENCOUNTER — Encounter (HOSPITAL_COMMUNITY): Payer: Self-pay | Admitting: Emergency Medicine

## 2022-05-04 ENCOUNTER — Other Ambulatory Visit: Payer: Self-pay

## 2022-05-04 ENCOUNTER — Emergency Department (HOSPITAL_COMMUNITY)
Admission: EM | Admit: 2022-05-04 | Discharge: 2022-05-05 | Disposition: A | Discharge status: Home / Self Care | Payer: Medicaid Other | Attending: Student | Admitting: Student

## 2022-05-04 DIAGNOSIS — R5383 Other fatigue: Secondary | ICD-10-CM | POA: Diagnosis not present

## 2022-05-04 DIAGNOSIS — Z5321 Procedure and treatment not carried out due to patient leaving prior to being seen by health care provider: Secondary | ICD-10-CM | POA: Diagnosis not present

## 2022-05-04 DIAGNOSIS — F1994 Other psychoactive substance use, unspecified with psychoactive substance-induced mood disorder: Secondary | ICD-10-CM | POA: Diagnosis not present

## 2022-05-04 DIAGNOSIS — Z20822 Contact with and (suspected) exposure to covid-19: Secondary | ICD-10-CM | POA: Diagnosis not present

## 2022-05-04 DIAGNOSIS — F419 Anxiety disorder, unspecified: Secondary | ICD-10-CM | POA: Insufficient documentation

## 2022-05-04 DIAGNOSIS — F10129 Alcohol abuse with intoxication, unspecified: Secondary | ICD-10-CM | POA: Diagnosis not present

## 2022-05-04 MED ORDER — ESCITALOPRAM OXALATE 10 MG PO TABS
10.0000 mg | ORAL_TABLET | Freq: Every day | ORAL | 0 refills | Status: DC
Start: 2022-05-04 — End: 2022-09-13

## 2022-05-04 NOTE — ED Notes (Signed)
Pt asleep in bed. Respirations even and unlabored. Will continue to monitor for safety. ?

## 2022-05-04 NOTE — ED Notes (Signed)
Pt refused to have cereal for breakfast saying that he is too  old to be having cereal

## 2022-05-04 NOTE — ED Notes (Signed)
PT is currently in room sleeping, no distress noted, will continue to monitor patient for safety

## 2022-05-04 NOTE — ED Provider Notes (Signed)
FBC/OBS ASAP Discharge Summary  Date and Time: 05/04/2022 5:45 PM  Name: William Powers  MRN:  [id number]   Discharge Diagnoses:  Final diagnoses:  Substance induced mood disorder (HCC)  Alcohol abuse with intoxication (HCC)    Subjective: William Powers 41 y.o., male patient b who initially presented to [facility] with passive SI and substance abuse on 05/03/2022.  He was admitted to the facility based crisis unit.  William Powers, 41 y.o., male patient seen face to face by this provider, consulted with Dr. Lucianne Muss; and chart reviewed on 05/04/22.    Patient reports he does not take any medications currently.  He does not have any outpatient psychiatric services in place.  He has a history of polysubstance abuse.  On admission UDS positive for benzodiazepines, cocaine, and marijuana.  EtOH level 146. Of note patient was discharged from the [facility] on 04/19/2022  During evaluation William Powers is observed sitting on his bed.  He is alert/oriented x4, cooperative, and attentive.  His speech is clear, coherent, normal rate and tone.  He appears anxious and states that he is ready to be discharged.  Reports he does not like being on the unit because he does not have a TV, his personal cell phone, or anything else to do.  Reports he is still seeking substance abuse treatment but he would prefer to do that on an outpatient basis.  He continues to endorse depression.  He denies any concerns with appetite or sleep.  He denies SI/HI/AVH.  He contracts for safety.  He denies any access to firearms/weapons.  He is able to answer questions appropriately   Stay Summary:   William Powers was admitted to [facility] unit for Substance abuse [facility]) and crisis management.  He was treated with Lexapro 10 mg daily for depression.  Medication was tolerated with no adverse reactions.  Patient was discharged with printed prescription and instructions on how to take medications as prescribed.  He remained calm and  cooperative while on the unit.  He exhibited no unsafe behaviors.  He was appropriate with staff and other patients.  He was given resources for outpatient psychiatric services including medication management and therapy, outpatient/inpatient/residential substance abuse treatment options.  In addition he was given resources for shelters and halfway houses.     Upon completion of this admission the William Powers was both mentally and medically stable for discharge denying suicidal/homicidal ideation, auditory/visual/tactile hallucinations, delusional thoughts and paranoia.     Total Time spent with patient: 30 minutes  Past Psychiatric History: see H&P Past Medical History:  Past Medical History:  Diagnosis Date   Heart failure (HCC)    HFrEF (heart failure with reduced ejection fraction) (HCC)    15% EF as of 2019   HIV (human immunodeficiency virus infection) (HCC)    Hypertension    Polysubstance abuse (HCC)    Pulmonary embolism (HCC)     Past Surgical History:  Procedure Laterality Date   HERNIA REPAIR     Family History:  Family History  Problem Relation Age of Onset   Lupus Mother    HIV Mother    HIV Father    Family Psychiatric History: see H&P Social History:  Social History   Substance and Sexual Activity  Alcohol Use Yes   Comment: 10 pints per week     Social History   Substance and Sexual Activity  Drug Use Yes   Types: Marijuana, Cocaine    Social  History   Socioeconomic History   Marital status: Divorced    Spouse name: Not on file   Number of children: Not on file   Years of education: Not on file   Highest education level: Not on file  Occupational History   Not on file  Tobacco Use   Smoking status: Some Days    Packs/day: 0.50    Types: Cigarettes, Cigars   Smokeless tobacco: Never  Vaping Use   Vaping Use: Never used  Substance and Sexual Activity   Alcohol use: Yes    Comment: 10 pints per week   Drug use: Yes    Types:  Marijuana, Cocaine   Sexual activity: Not on file  Other Topics Concern   Not on file  Social History Narrative   Not on file   Social Determinants of Health   Financial Resource Strain: Not on file  Food Insecurity: Not on file  Transportation Needs: Not on file  Physical Activity: Not on file  Stress: Not on file  Social Connections: Not on file   SDOH:  SDOH Screenings   Alcohol Screen: Not on file  Depression (PHQ2-9): Low Risk  (08/11/2021)   Depression (PHQ2-9)    PHQ-2 Score: 0  Financial Resource Strain: Not on file  Food Insecurity: Not on file  Housing: Not on file  Physical Activity: Not on file  Social Connections: Not on file  Stress: Not on file  Tobacco Use: High Risk (05/03/2022)   Patient History    Smoking Tobacco Use: Some Days    Smokeless Tobacco Use: Never    Passive Exposure: Not on file  Transportation Needs: Not on file    Tobacco Cessation:  N/A, patient does not currently use tobacco products  Current Medications:  No current facility-administered medications for this encounter.   Current Outpatient Medications  Medication Sig Dispense Refill   BIKTARVY 50-200-25 MG TABS tablet Take 1 tablet by mouth daily. (Patient not taking: Reported on 03/01/2022) 30 tablet 0   escitalopram (LEXAPRO) 10 MG tablet Take 1 tablet (10 mg total) by mouth daily. 30 tablet 0   metoprolol succinate (TOPROL-XL) 50 MG 24 hr tablet Take 1 tablet by mouth 2 times daily. (Patient not taking: Reported on 03/01/2022) 60 tablet 0    PTA Medications: (Not in a hospital admission)      08/11/2021    8:12 AM  Depression screen PHQ 2/9  Decreased Interest 0  Down, Depressed, Hopeless 0  PHQ - 2 Score 0    Flowsheet Row ED from 05/03/2022 in [facility] ED from 05/01/2022 in [facility] from 04/28/2022 in [facility] EMERGENCY DEPARTMENT  C-SSRS RISK CATEGORY High Risk No Risk No Risk        Musculoskeletal  Strength & Muscle Tone: within normal limits Gait & Station: normal Patient leans: N/A  Psychiatric Specialty Exam  Presentation  General Appearance: Appropriate for Environment; Casual  Eye Contact:Good  Speech:Normal Rate; Clear and Coherent  Speech Volume:Normal  Handedness:Right   Mood and Affect  Mood:Anxious; Depressed  Affect:Congruent   Thought Process  Thought Processes:Coherent  Descriptions of Associations:Intact  Orientation:Full (Time, Place and Person)  Thought Content:Logical  Diagnosis of Schizophrenia or Schizoaffective disorder in past: No    Hallucinations:Hallucinations: None  Ideas of Reference:None  Suicidal Thoughts:Suicidal Thoughts: No SI Passive Intent and/or Plan: With Intent; Without Plan; Without Means to Carry Out  Homicidal Thoughts:Homicidal Thoughts: No   Sensorium  Memory:Immediate Good; Recent Good; Remote Good  Judgment:Fair  Insight:Fair   Executive Functions  Concentration:Good  Attention Span:Good  Recall:Good  Fund of Knowledge:Good  Language:Good   Psychomotor Activity  Psychomotor Activity:Psychomotor Activity: Normal   Assets  Assets:Communication Skills; Desire for Improvement; Leisure Time; Physical Health; Resilience   Sleep  Sleep:Sleep: Fair   No data recorded  Physical Exam  Physical Exam Vitals and nursing note reviewed.  Constitutional:      Appearance: Normal appearance. He is well-developed.  HENT:     Head: Normocephalic and atraumatic.  Eyes:     General:        Right eye: No discharge.        Left eye: No discharge.     Conjunctiva/sclera: Conjunctivae normal.  Cardiovascular:     Rate and Rhythm: Normal rate.  Pulmonary:     Effort: Pulmonary effort is normal. No respiratory distress.  Musculoskeletal:        General: Normal range of motion.     Cervical back: Normal range of motion.  Skin:    Coloration: Skin is not jaundiced or pale.   Neurological:     Mental Status: He is alert and oriented to person, place, and time.  Psychiatric:        Attention and Perception: Attention and perception normal.        Mood and Affect: Mood is anxious and depressed.        Speech: Speech normal.        Behavior: Behavior normal. Behavior is cooperative.        Thought Content: Thought content normal.        Cognition and Memory: Cognition normal.        Judgment: Judgment is impulsive.    Review of Systems  Constitutional: Negative.   HENT: Negative.    Eyes: Negative.   Respiratory: Negative.    Cardiovascular: Negative.   Musculoskeletal: Negative.   Skin: Negative.   Neurological: Negative.   Psychiatric/Behavioral:  Positive for depression and substance abuse. The patient is nervous/anxious.    Blood pressure 122/86, pulse 64, temperature 98.1 F (36.7 C), temperature source Oral, resp. rate 18, SpO2 99 %. There is no height or weight on file to calculate BMI.  Demographic Factors:  Male, Low socioeconomic status, Living alone, and Unemployed  Loss Factors: Financial problems/change in socioeconomic status  Historical Factors: Impulsivity  Risk Reduction Factors:   Sense of responsibility to family, Positive social support, Positive therapeutic relationship, and Positive coping skills or problem solving skills  Continued Clinical Symptoms:  Severe Anxiety and/or Agitation Depression:   Comorbid alcohol abuse/dependence Impulsivity Alcohol/Substance Abuse/Dependencies  Cognitive Features That Contribute To Risk:  None    Suicide Risk:  Minimal: No identifiable suicidal ideation.  Patients presenting with no risk factors but with morbid ruminations; may be classified as minimal risk based on the severity of the depressive symptoms  Plan Of Care/Follow-up recommendations:  Activity:  as tolerated  Diet:  regular   Disposition:   Discharge patient  Patient was provided a printed prescription for  Lexapro 10 mg daily.  Provided outpatient psychiatric resources for medication management and therapy.  Provided outpatient/inpatient/residential substance abuse treatment options/resources.  No evidence of imminent risk to self or others at present.    Patient does not meet criteria for psychiatric inpatient admission. Discussed crisis plan, support from social network, calling 911, coming to the Emergency Department, and calling Suicide Hotline.   William Hughs, NP 05/04/2022, 5:45 PM

## 2022-05-04 NOTE — ED Triage Notes (Signed)
Patient arrived with EMS from street , drank Vodka with Codeine this evening , he states feeling anxious .

## 2022-05-05 LAB — COMPREHENSIVE METABOLIC PANEL
ALT: 30 U/L (ref 0–44)
AST: 33 U/L (ref 15–41)
Albumin: 3.5 g/dL (ref 3.5–5.0)
Alkaline Phosphatase: 57 U/L (ref 38–126)
Anion gap: 11 (ref 5–15)
BUN: 11 mg/dL (ref 6–20)
CO2: 20 mmol/L — ABNORMAL LOW (ref 22–32)
Calcium: 8.6 mg/dL — ABNORMAL LOW (ref 8.9–10.3)
Chloride: 106 mmol/L (ref 98–111)
Creatinine, Ser: 1.09 mg/dL (ref 0.61–1.24)
GFR, Estimated: >60 mL/min (ref >60)
Glucose, Bld: 98 mg/dL (ref 70–99)
Potassium: 3.9 mmol/L (ref 3.5–5.1)
Sodium: 137 mmol/L (ref 135–145)
Total Bilirubin: 0.7 mg/dL (ref 0.3–1.2)
Total Protein: 7.7 g/dL (ref 6.5–8.1)

## 2022-05-05 LAB — CBC WITH DIFFERENTIAL/PLATELET
Abs Immature Granulocytes: 0.01 10*3/uL (ref 0.00–0.07)
Basophils Absolute: 0 10*3/uL (ref 0.0–0.1)
Basophils Relative: 1 %
Eosinophils Absolute: 0.1 10*3/uL (ref 0.0–0.5)
Eosinophils Relative: 3 %
HCT: 39 % (ref 39.0–52.0)
Hemoglobin: 13.2 g/dL (ref 13.0–17.0)
Immature Granulocytes: 0 %
Lymphocytes Relative: 31 %
Lymphs Abs: 1.2 10*3/uL (ref 0.7–4.0)
MCH: 31.2 pg (ref 26.0–34.0)
MCHC: 33.8 g/dL (ref 30.0–36.0)
MCV: 92.2 fL (ref 80.0–100.0)
Monocytes Absolute: 0.4 10*3/uL (ref 0.1–1.0)
Monocytes Relative: 10 %
Neutro Abs: 2 10*3/uL (ref 1.7–7.7)
Neutrophils Relative %: 55 %
Platelets: 245 10*3/uL (ref 150–400)
RBC: 4.23 MIL/uL (ref 4.22–5.81)
RDW: 13.3 % (ref 11.5–15.5)
WBC: 3.7 10*3/uL — ABNORMAL LOW (ref 4.0–10.5)
nRBC: 0 % (ref 0.0–0.2)

## 2022-05-05 LAB — SALICYLATE LEVEL: Salicylate Lvl: <7 mg/dL — ABNORMAL LOW (ref 7.0–30.0)

## 2022-05-05 LAB — ACETAMINOPHEN LEVEL: Acetaminophen (Tylenol), Serum: <10 ug/mL — ABNORMAL LOW (ref 10–30)

## 2022-05-05 LAB — ETHANOL: Alcohol, Ethyl (B): 152 mg/dL — ABNORMAL HIGH (ref <10)

## 2022-05-05 NOTE — ED Notes (Signed)
Patient didn't answer called patient for several times for vital recheck

## 2022-05-05 NOTE — ED Provider Triage Note (Signed)
Emergency Medicine Provider Triage Evaluation Note  William Powers , a 41 y.o. male  was evaluated in triage.  Pt complains of anxiety. Reports drinking EtOH and had some codeine, states he feels bad and is tired, overall poor historian. Behavior health team admitted for obs 07/15 for passive SI and substance abuse, discharged yesterday.   Review of Systems  Positive: Anxiety, fatigue  Physical Exam  BP 109/81   Pulse (!) 105   Temp 97.6 F (36.4 C) (Oral)   Resp 16   SpO2 95%  Gen:   Awake, no distress   Resp:  Normal effort  MSK:   Moves extremities without difficulty  Other:   Speech mildly slurred. Moving all extremities.   Medical Decision Making  Medically screening exam initiated at 12:10 AM.  Appropriate orders placed.  William Powers was informed that the remainder of the evaluation will be completed by another provider, this initial triage assessment does not replace that evaluation, and the importance of remaining in the ED until their evaluation is complete.  Anxiety     William Powers, William Koch, PA-C 05/05/22 0019

## 2022-05-05 NOTE — ED Notes (Signed)
Called patient several times for vital check patient didn't answer 

## 2022-05-05 NOTE — ED Notes (Signed)
No answer lobby 

## 2022-05-06 ENCOUNTER — Emergency Department (HOSPITAL_COMMUNITY)
Admission: EM | Admit: 2022-05-06 | Discharge: 2022-05-07 | Disposition: A | Discharge status: Home / Self Care | Payer: Medicaid Other | Attending: Emergency Medicine | Admitting: Emergency Medicine

## 2022-05-06 DIAGNOSIS — F419 Anxiety disorder, unspecified: Secondary | ICD-10-CM | POA: Diagnosis not present

## 2022-05-06 DIAGNOSIS — Z79899 Other long term (current) drug therapy: Secondary | ICD-10-CM | POA: Insufficient documentation

## 2022-05-06 DIAGNOSIS — D72819 Decreased white blood cell count, unspecified: Secondary | ICD-10-CM | POA: Insufficient documentation

## 2022-05-06 DIAGNOSIS — I10 Essential (primary) hypertension: Secondary | ICD-10-CM | POA: Insufficient documentation

## 2022-05-06 DIAGNOSIS — R45851 Suicidal ideations: Secondary | ICD-10-CM

## 2022-05-06 DIAGNOSIS — Z21 Asymptomatic human immunodeficiency virus [HIV] infection status: Secondary | ICD-10-CM | POA: Diagnosis not present

## 2022-05-07 ENCOUNTER — Encounter (HOSPITAL_COMMUNITY): Payer: Self-pay

## 2022-05-07 ENCOUNTER — Other Ambulatory Visit: Payer: Self-pay

## 2022-05-07 DIAGNOSIS — R45851 Suicidal ideations: Secondary | ICD-10-CM

## 2022-05-07 LAB — CBC
HCT: 42.5 % (ref 39.0–52.0)
Hemoglobin: 14.4 g/dL (ref 13.0–17.0)
MCH: 31.2 pg (ref 26.0–34.0)
MCHC: 33.9 g/dL (ref 30.0–36.0)
MCV: 92 fL (ref 80.0–100.0)
Platelets: 277 10*3/uL (ref 150–400)
RBC: 4.62 MIL/uL (ref 4.22–5.81)
RDW: 13.4 % (ref 11.5–15.5)
WBC: 2.9 10*3/uL — ABNORMAL LOW (ref 4.0–10.5)
nRBC: 0 % (ref 0.0–0.2)

## 2022-05-07 LAB — ACETAMINOPHEN LEVEL: Acetaminophen (Tylenol), Serum: <10 ug/mL — ABNORMAL LOW (ref 10–30)

## 2022-05-07 LAB — COMPREHENSIVE METABOLIC PANEL
ALT: 31 U/L (ref 0–44)
AST: 31 U/L (ref 15–41)
Albumin: 4.1 g/dL (ref 3.5–5.0)
Alkaline Phosphatase: 58 U/L (ref 38–126)
Anion gap: 8 (ref 5–15)
BUN: 15 mg/dL (ref 6–20)
CO2: 23 mmol/L (ref 22–32)
Calcium: 8.9 mg/dL (ref 8.9–10.3)
Chloride: 108 mmol/L (ref 98–111)
Creatinine, Ser: 1.16 mg/dL (ref 0.61–1.24)
GFR, Estimated: >60 mL/min (ref >60)
Glucose, Bld: 95 mg/dL (ref 70–99)
Potassium: 3.6 mmol/L (ref 3.5–5.1)
Sodium: 139 mmol/L (ref 135–145)
Total Bilirubin: 0.7 mg/dL (ref 0.3–1.2)
Total Protein: 8.6 g/dL — ABNORMAL HIGH (ref 6.5–8.1)

## 2022-05-07 LAB — RAPID URINE DRUG SCREEN, HOSP PERFORMED
Amphetamines: NOT DETECTED
Barbiturates: NOT DETECTED
Benzodiazepines: NOT DETECTED
Cocaine: NOT DETECTED
Opiates: NOT DETECTED
Tetrahydrocannabinol: POSITIVE — AB

## 2022-05-07 LAB — SALICYLATE LEVEL: Salicylate Lvl: <7 mg/dL — ABNORMAL LOW (ref 7.0–30.0)

## 2022-05-07 LAB — ETHANOL: Alcohol, Ethyl (B): 148 mg/dL — ABNORMAL HIGH (ref <10)

## 2022-05-07 MED ORDER — ESCITALOPRAM OXALATE 10 MG PO TABS
10.0000 mg | ORAL_TABLET | Freq: Every day | ORAL | Status: DC
  Administered 2022-05-07: 10 mg via ORAL
  Filled 2022-05-07: qty 1

## 2022-05-07 MED ORDER — BICTEGRAVIR-EMTRICITAB-TENOFOV 50-200-25 MG PO TABS
1.0000 | ORAL_TABLET | Freq: Every day | ORAL | Status: DC
  Filled 2022-05-07: qty 1

## 2022-05-07 NOTE — ED Triage Notes (Signed)
Patient said he ran out of Ativan for the past 4 days and when he does, he becomes suicidal. He said he has a plan to drink acid.

## 2022-05-07 NOTE — ED Provider Notes (Signed)
[facility]-EMERGENCY DEPT Provider Note   CSN: [id number] Arrival date & time: 05/06/22  2350     History  Chief Complaint  Patient presents with   Suicidal    William Powers is a 41 y.o. male.  HPI  Medical history including hypertension, HIV, heart failure polysubstance dependency presents with complaints of suicidal ideation.  Patient states that he is here today because he is extremely anxious, states that he ran out of his Ativan and been without it for the last 4 days, because of this he wants to kill himself.  He states that he still has active ideas of killing himself, he states that if sent home he is going to kill himself, states he has multiple ways of doing it like eating a bunch of pills, drinking bleach, cutting himself.  Denies any illicit drug use, denies homicidal ideations, he has no other complaints.  Reviewed patient's chart seen in the past  for similar presentation, most notably was seen here 5 days ago for suicidal ideation, patient was observed for 1 day, and was started on Lexapro.  Discharged with outpatient follow-up.  Home Medications Prior to Admission medications   Medication Sig Start Date End Date Taking? Authorizing Provider  BIKTARVY 50-200-25 MG TABS tablet Take 1 tablet by mouth daily. Patient not taking: Reported on 03/01/2022 11/18/21   Melene Plan, DO  escitalopram (LEXAPRO) 10 MG tablet Take 1 tablet (10 mg total) by mouth daily. Patient not taking: Reported on 05/07/2022 05/04/22   Ardis Hughs, NP  metoprolol succinate (TOPROL-XL) 50 MG 24 hr tablet Take 1 tablet by mouth 2 times daily. Patient not taking: Reported on 03/01/2022 11/18/21   Melene Plan, DO  Potassium Chloride ER 20 MEQ TBCR Take 20 mEq by mouth daily at 6 (six) AM. Patient not taking: No sig reported 08/07/21 09/05/21  Bobette Mo, MD      Allergies    Bee venom, Lisinopril, and Sulfamethoxazole-trimethoprim    Review of Systems   Review  of Systems  Constitutional:  Negative for chills and fever.  Respiratory:  Negative for shortness of breath.   Cardiovascular:  Negative for chest pain.  Gastrointestinal:  Negative for abdominal pain.  Neurological:  Negative for headaches.    Physical Exam Updated Vital Signs BP (!) 121/103 (BP Location: Right Arm)   Pulse (!) 103   Temp 98.5 F (36.9 C) (Oral)   Resp 16   SpO2 93%  Physical Exam Vitals and nursing note reviewed.  Constitutional:      General: He is not in acute distress.    Appearance: He is not ill-appearing.  HENT:     Head: Normocephalic and atraumatic.     Nose: No congestion.  Eyes:     Conjunctiva/sclera: Conjunctivae normal.  Cardiovascular:     Rate and Rhythm: Normal rate and regular rhythm.     Pulses: Normal pulses.  Pulmonary:     Effort: Pulmonary effort is normal.  Skin:    General: Skin is warm and dry.  Neurological:     Mental Status: He is alert.     Comments: No facial asymmetry no difficulty word finding following two-step commands no unilateral weakness present.  Psychiatric:        Mood and Affect: Mood normal.     Comments: Responding appropriately, does not appear to be responding to internal stimuli, endorses suicidal ideation with active plan no  homicidal ideation     ED Results / Procedures /  Treatments   Labs (all labs ordered are listed, but only abnormal results are displayed) Labs Reviewed  COMPREHENSIVE METABOLIC PANEL - Abnormal; Notable for the following components:      Result Value   Total Protein 8.6 (*)    All other components within normal limits  ETHANOL - Abnormal; Notable for the following components:   Alcohol, Ethyl (B) 148 (*)    All other components within normal limits  SALICYLATE LEVEL - Abnormal; Notable for the following components:   Salicylate Lvl [id number] (*)    All other components within normal limits  ACETAMINOPHEN LEVEL - Abnormal; Notable for the following components:   Acetaminophen  (Tylenol), Serum <10 (*)    All other components within normal limits  CBC - Abnormal; Notable for the following components:   WBC 2.9 (*)    All other components within normal limits  RAPID URINE DRUG SCREEN, HOSP PERFORMED    EKG None  Radiology No results found.  Procedures Procedures    Medications Ordered in ED Medications  escitalopram (LEXAPRO) tablet 10 mg (has no administration in time range)  bictegravir-emtricitabine-tenofovir AF (BIKTARVY) 50-200-25 MG per tablet 1 tablet (has no administration in time range)    ED Course/ Medical Decision Making/ A&P                           Medical Decision Making Amount and/or Complexity of Data Reviewed Labs: ordered.   This patient presents to the ED for concern of suicidal ideation, this involves an extensive number of treatment options, and is a complaint that carries with it a high risk of complications and morbidity.  The differential diagnosis includes withdrawals, psychiatric emergency, metabolic derailments    Additional history obtained:  Additional history obtained from N/A External records from outside source obtained and reviewed including previous behavioral health notes   Co morbidities that complicate the patient evaluation  Polysubstance dependency  Social Determinants of Health:  Homelessness    Lab Tests:  I Ordered, and personally interpreted labs.  The pertinent results include: Leukopenia 2.9, ethanol 148 CMP unremarkable   Imaging Studies ordered:  I ordered imaging studies including N/A I independently visualized and interpreted imaging which showed N/A I agree with the radiologist interpretation   Cardiac Monitoring:  The patient was maintained on a cardiac monitor.  I personally viewed and interpreted the cardiac monitored which showed an underlying rhythm of: N/A   Medicines ordered and prescription drug management:  I ordered medication including Lexapro I have  reviewed the patients home medicines and have made adjustments as needed  Critical Interventions:  N/A   Reevaluation:  Presents with suicidal ideation, triage obtain lab work which were unremarkable, continue endorses ideation will consult TTS for further recommendations  Consultations Obtained:  TTS pending at this time    Test Considered:  N/A    Rule out Low suspicion for withdrawals nontremulous on exam vital signs reassuring.  Low suspicion for metabolic derailments lab work is unremarkable    Dispostion and problem list  After consideration of the diagnostic results and the patients response to treatment, I feel that the patent would benefit from psychiatric hold, home meds have been ordered, TTS consultation pending at this time, he is not under IVC at this time would not place him under IVC as patient has had frequent similar presentation in the past and was recently seen by TTS 5 days ago.  Final Clinical Impression(s) / ED Diagnoses Final diagnoses:  Suicidal ideation    Rx / DC Orders ED Discharge Orders     None         Carroll Sage, PA-C 05/07/22 0306    Dione Booze, MD 05/07/22 [phone]

## 2022-05-07 NOTE — ED Provider Notes (Signed)
Patient seen by psychiatry this morning and cleared for discharge   Lorre Nick, MD 05/07/22 1011

## 2022-05-07 NOTE — BH Assessment (Signed)
Per Dr. Kumar in the 9am morning huddle/bed meeting, Provider to assess. No TTS CCA is needed at this time. 

## 2022-05-07 NOTE — Consult Note (Signed)
[facility] Psych ED Discharge  05/07/2022 3:24 PM William Powers  MRN:  [id number]  Method of visit?: Face to Face   Principal Problem: Suicidal ideation Discharge Diagnoses: Principal Problem:   Suicidal ideation   Subjective:  William Powers stated " I am fine, I am ready to go, I need a William Powers bus pass."   William Powers was seen and evaluated face-to-face by this provider he is denying suicidal or homicidal ideations.  States he was initially seeking Ativan or something for his withdrawals.  Then stated that he will be fine and is seeking a William Powers bus pass.  Patient was recently discharged from facility based crisis center.  He has additional outpatient resources for follow-up.  No other safety concerns noted.  NP will follow-up with CSW for bus pass to request.  Case staffed with attending psychiatrist MD Lucianne Muss.  Support, encouragement and  reassurance was provided.  During evaluation William Powers is sitting in no acute distress.He is alert/oriented x 4; calm/cooperative; and mood congruent with affect. he is speaking in a clear tone at moderate volume, and normal pace; with good eye contact. His thought process is coherent and relevant; There is no indication that he is currently responding to internal/external stimuli or experiencing delusional thought content; and he has denied suicidal/self-harm/homicidal ideation, psychosis, and paranoia.   Patient has remained calm throughout assessment and has answered questions appropriately.    William Powers is educated and verbalizes understanding of mental health resources and other crisis services in the community.he is instructed to call 911 and present to the nearest emergency room should he experience any suicidal/homicidal ideation, auditory/visual/hallucinations, or detrimental worsening of his mental health condition.  He was a also advised by Clinical research associate that he could call the toll-free phone on insurance card to assist with identifying in network  counselors and agencies or number on back of Medicaid card t speak with Powers coordinator    Total Time spent with patient: 15 minutes  Past Psychiatric History:   Past Medical History:  Past Medical History:  Diagnosis Date   Heart failure (HCC)    HFrEF (heart failure with reduced ejection fraction) (HCC)    15% EF as of 2019   HIV (human immunodeficiency virus infection) (HCC)    Hypertension    Polysubstance abuse (HCC)    Pulmonary embolism (HCC)     Past Surgical History:  Procedure Laterality Date   HERNIA REPAIR     Family History:  Family History  Problem Relation Age of Onset   Lupus Mother    HIV Mother    HIV Father    Family Psychiatric  History: Social History:  Social History   Substance and Sexual Activity  Alcohol Use Yes   Comment: 10 pints per week     Social History   Substance and Sexual Activity  Drug Use Yes   Types: Marijuana, Cocaine    Social History   Socioeconomic History   Marital status: Divorced    Spouse name: Not on file   Number of children: Not on file   Years of education: Not on file   Highest education level: Not on file  Occupational History   Not on file  Tobacco Use   Smoking status: Some Days    Packs/day: 0.50    Types: Cigarettes, Cigars   Smokeless tobacco: Never  Vaping Use   Vaping Use: Never used  Substance and Sexual Activity   Alcohol use: Yes    Comment: 10 pints  per week   Drug use: Yes    Types: Marijuana, Cocaine   Sexual activity: Not on file  Other Topics Concern   Not on file  Social History Narrative   Not on file   Social Determinants of Health   Financial Resource Strain: Not on file  Food Insecurity: Not on file  Transportation Needs: Not on file  Physical Activity: Not on file  Stress: Not on file  Social Connections: Not on file    Tobacco Cessation:  N/A, patient does not currently use tobacco products  Current Medications: Current Facility-Administered Medications   Medication Dose Route Frequency Provider Last Rate Last Admin   bictegravir-emtricitabine-tenofovir AF (BIKTARVY) 50-200-25 MG per tablet 1 tablet  1 tablet Oral Daily Carroll Sage, PA-C       escitalopram (LEXAPRO) tablet 10 mg  10 mg Oral Daily Carroll Sage, PA-C   10 mg at 05/07/22 3419   Current Outpatient Medications  Medication Sig Dispense Refill   BIKTARVY 50-200-25 MG TABS tablet Take 1 tablet by mouth daily. (Patient not taking: Reported on 03/01/2022) 30 tablet 0   escitalopram (LEXAPRO) 10 MG tablet Take 1 tablet (10 mg total) by mouth daily. (Patient not taking: Reported on 05/07/2022) 30 tablet 0   metoprolol succinate (TOPROL-XL) 50 MG 24 hr tablet Take 1 tablet by mouth 2 times daily. (Patient not taking: Reported on 03/01/2022) 60 tablet 0   PTA Medications: (Not in a hospital admission)   Musculoskeletal: Strength & Muscle Tone: within normal limits Gait & Station: normal Patient leans: N/A  Psychiatric Specialty Exam:  Presentation  General Appearance: Appropriate for Environment; Casual  Eye Contact:Good  Speech:Normal Rate; Clear and Coherent  Speech Volume:Normal  Handedness:Right   Mood and Affect  Mood:Anxious; Depressed  Affect:Congruent   Thought Process  Thought Processes:Coherent  Descriptions of Associations:Intact  Orientation:Full (Time, Place and Person)  Thought Content:Logical  History of Schizophrenia/Schizoaffective disorder:No  Duration of Psychotic Symptoms:N/A  Hallucinations:No data recorded Ideas of Reference:None  Suicidal Thoughts:No data recorded Homicidal Thoughts:No data recorded  Sensorium  Memory:Immediate Good; Recent Good; Remote Good  Judgment:Fair  Insight:Fair   Executive Functions  Concentration:Good  Attention Span:Good  Recall:Good  Fund of Knowledge:Good  Language:Good   Psychomotor Activity  Psychomotor Activity:No data recorded  Assets  Assets:Communication Skills;  Desire for Improvement; Leisure Time; Physical Health; Resilience   Sleep  Sleep:No data recorded   Physical Exam: Physical Exam Vitals and nursing note reviewed.  HENT:     Head: Normocephalic.  Cardiovascular:     Rate and Rhythm: Normal rate and regular rhythm.  Neurological:     Mental Status: He is alert.  Psychiatric:        Mood and Affect: Mood normal.        Thought Content: Thought content normal.    Review of Systems  Constitutional: Negative.   Psychiatric/Behavioral:  Positive for depression and substance abuse.   All other systems reviewed and are negative.  Blood pressure (!) 132/91, pulse 90, temperature 98.7 F (37.1 C), resp. rate 18, SpO2 98 %. There is no height or weight on file to calculate BMI.   Demographic Factors:  Male and Low socioeconomic status  Loss Factors: Financial problems/change in socioeconomic status  Historical Factors: Family history of mental illness or substance abuse  Risk Reduction Factors:   NA  Continued Clinical Symptoms:  Depression:   Impulsivity  Cognitive Features That Contribute To Risk:  Closed-mindedness    Suicide Risk:  Minimal:  No identifiable suicidal ideation.  Patients presenting with no risk factors but with morbid ruminations; may be classified as minimal risk based on the severity of the depressive symptoms    Plan Of Powers/Follow-up recommendations:  Activity:  as tolerated Diet:  heart healthy   Disposition: Take all medications as prescribed. Keep all follow-up appointments as scheduled.  Do not consume alcohol or use illegal drugs while on prescription medications. Report any adverse effects from your medications to your primary Powers provider promptly.  In the event of recurrent symptoms or worsening symptoms, call 911, a crisis hotline, or go to the nearest emergency department for evaluation.   Oneta Rack, NP 05/07/2022, 3:24 PM

## 2022-05-07 NOTE — ED Notes (Signed)
Pt has been dressed out into scrubs

## 2022-05-07 NOTE — ED Notes (Addendum)
Patient has changed into appropriate scrubs. Patient belongings in triage nursing station, contains one white bag with patients clothes.

## 2022-05-07 NOTE — ED Notes (Signed)
Pt resting comfortably and in no distress and had no complaints. Sitter at bedside.

## 2022-05-10 ENCOUNTER — Emergency Department (HOSPITAL_COMMUNITY)
Admission: EM | Admit: 2022-05-10 | Discharge: 2022-05-10 | Disposition: A | Discharge status: Home / Self Care | Payer: Medicaid Other | Attending: Emergency Medicine | Admitting: Emergency Medicine

## 2022-05-10 ENCOUNTER — Encounter (HOSPITAL_COMMUNITY): Payer: Self-pay | Admitting: Emergency Medicine

## 2022-05-10 ENCOUNTER — Other Ambulatory Visit: Payer: Self-pay

## 2022-05-10 ENCOUNTER — Ambulatory Visit (HOSPITAL_COMMUNITY)
Admission: EM | Admit: 2022-05-10 | Discharge: 2022-05-10 | Disposition: A | Discharge status: Home / Self Care | Payer: Medicaid Other | Source: Home / Self Care

## 2022-05-10 ENCOUNTER — Encounter (HOSPITAL_COMMUNITY): Payer: Self-pay

## 2022-05-10 DIAGNOSIS — I509 Heart failure, unspecified: Secondary | ICD-10-CM | POA: Insufficient documentation

## 2022-05-10 DIAGNOSIS — R45851 Suicidal ideations: Secondary | ICD-10-CM | POA: Insufficient documentation

## 2022-05-10 DIAGNOSIS — Z21 Asymptomatic human immunodeficiency virus [HIV] infection status: Secondary | ICD-10-CM | POA: Insufficient documentation

## 2022-05-10 DIAGNOSIS — Z139 Encounter for screening, unspecified: Secondary | ICD-10-CM | POA: Insufficient documentation

## 2022-05-10 DIAGNOSIS — F1910 Other psychoactive substance abuse, uncomplicated: Secondary | ICD-10-CM | POA: Insufficient documentation

## 2022-05-10 DIAGNOSIS — I110 Hypertensive heart disease with heart failure: Secondary | ICD-10-CM | POA: Diagnosis not present

## 2022-05-10 DIAGNOSIS — Z5900 Homelessness unspecified: Secondary | ICD-10-CM | POA: Insufficient documentation

## 2022-05-10 DIAGNOSIS — Z046 Encounter for general psychiatric examination, requested by authority: Secondary | ICD-10-CM | POA: Diagnosis not present

## 2022-05-10 DIAGNOSIS — R5383 Other fatigue: Secondary | ICD-10-CM | POA: Insufficient documentation

## 2022-05-10 DIAGNOSIS — Z765 Malingerer [conscious simulation]: Secondary | ICD-10-CM | POA: Insufficient documentation

## 2022-05-10 DIAGNOSIS — Z79899 Other long term (current) drug therapy: Secondary | ICD-10-CM | POA: Diagnosis not present

## 2022-05-10 MED ORDER — ONDANSETRON 4 MG PO TBDP
4.0000 mg | ORAL_TABLET | Freq: Once | ORAL | Status: AC
  Administered 2022-05-10: 4 mg via ORAL
  Filled 2022-05-10: qty 1

## 2022-05-10 NOTE — Discharge Instructions (Addendum)

## 2022-05-10 NOTE — ED Provider Notes (Signed)
[facility] EMERGENCY DEPARTMENT Provider Note   CSN: [id number] Arrival date & time: 05/10/22  0453     History  No chief complaint on file.   William Powers is a 41 y.o. male.  The history is provided by the patient and medical records.   41 y.o. M with hx of HIV, depression, chronic suicidal ideation, presenting to the ED with "needing help".  He has been seen for same numerous times-- most recently 05/07/22, cleared by psych and then seen again this AM at [facility].  He denies any changes, still vague complaints of "needing help".  He denies any attempted self harm.  Denies HI/AVH.  He states he does not have resources and has not attempted to follow-up as an OP.  Home Medications Prior to Admission medications   Medication Sig Start Date End Date Taking? Authorizing Provider  BIKTARVY 50-200-25 MG TABS tablet Take 1 tablet by mouth daily. Patient not taking: Reported on 03/01/2022 11/18/21   Melene Plan, DO  escitalopram (LEXAPRO) 10 MG tablet Take 1 tablet (10 mg total) by mouth daily. Patient not taking: Reported on 05/07/2022 05/04/22   Ardis Hughs, NP  metoprolol succinate (TOPROL-XL) 50 MG 24 hr tablet Take 1 tablet by mouth 2 times daily. Patient not taking: Reported on 03/01/2022 11/18/21   Melene Plan, DO  Potassium Chloride ER 20 MEQ TBCR Take 20 mEq by mouth daily at 6 (six) AM. Patient not taking: No sig reported 08/07/21 09/05/21  Bobette Mo, MD      Allergies    Bee venom, Lisinopril, and Sulfamethoxazole-trimethoprim    Review of Systems   Review of Systems  Psychiatric/Behavioral:         Medical clearance  All other systems reviewed and are negative.   Physical Exam Updated Vital Signs BP 122/90 (BP Location: Right Arm)   Pulse 83   Temp 98 F (36.7 C)   Resp 16   SpO2 98%   Physical Exam Vitals and nursing note reviewed.  Constitutional:      Appearance: He is well-developed.  HENT:     Head: Normocephalic and  atraumatic.  Eyes:     Conjunctiva/sclera: Conjunctivae normal.     Pupils: Pupils are equal, round, and reactive to light.  Cardiovascular:     Rate and Rhythm: Normal rate and regular rhythm.     Heart sounds: Normal heart sounds.  Pulmonary:     Effort: Pulmonary effort is normal.     Breath sounds: Normal breath sounds.  Abdominal:     General: Bowel sounds are normal.     Palpations: Abdomen is soft.  Musculoskeletal:        General: Normal range of motion.     Cervical back: Normal range of motion.  Skin:    General: Skin is warm and dry.  Neurological:     Mental Status: He is alert and oriented to person, place, and time.  Psychiatric:     Comments: Vague complaints of needing "help" No SI/HI/AVH voiced, not responding to internal stimuli     ED Results / Procedures / Treatments   Labs (all labs ordered are listed, but only abnormal results are displayed) Labs Reviewed - No data to display  EKG None  Radiology No results found.  Procedures Procedures    Medications Ordered in ED Medications - No data to display  ED Course/ Medical Decision Making/ A&P  Medical Decision Making  41 year old male presenting to the ED for psychiatric evaluation.  He has been seen numerous times recently for same, most recently on 05/07/2022 and cleared by psychiatry.  He was also seen again this morning at [facility] and just discharged about 4 hours ago.  He denies any changes, continues vaguely stating "I need help".  He does not endorse any active SI/HI/AVH.  He is not responding to internal stimuli.  He does not appear to be a danger to himself or others at this time.  Do not feel he needs emergent psychiatric evaluation and does not meet criteria for IVC.  I had a long discussion with patient about his multiple ED evaluations and need to follow-up as an outpatient for ongoing care.  He states he does not have any resources so these were provided for  him.  He was encouraged to follow-up at [facility] or call one of the local therapy centers to arrange follow-up.  Final Clinical Impression(s) / ED Diagnoses Final diagnoses:  Encounter for medical screening examination    Rx / DC Orders ED Discharge Orders     None         [name], PA-C 05/10/22 1610    Marily Memos, MD 05/10/22 [phone]

## 2022-05-10 NOTE — ED Triage Notes (Signed)
Patient here after being discharged from [facility] ED.  Patient has been to multiple EDs and [facility] being cleared multiple times for psychological evaluations.  Patient states that he does not have any resources at this time.  Patient to be discharged with resources.

## 2022-05-10 NOTE — ED Provider Notes (Addendum)
Behavioral Health Urgent Care Medical Screening Exam  Patient Name: William Powers MRN: [id number] Date of Evaluation: 05/10/22 Chief Complaint:   Diagnosis:  Final diagnoses:  Homelessness  Malingering    History of Present illness: William Powers is a 41 y.o. male with psychiatric history of polysubstance abuse (alcohol, cocaine, marijuana), depression, and suicidal ideation.  Patient was brought to [facility] by law enforcement. Patient is reporting suicidal ideation and requesting substance abuse treatment.   Off note; patient was evaluated at  [facility] with similar complaint and discharged at 0100. He has been evaluated in the EDs 25 times in the past 6 months.   This nurse practitioner met with patient face to face and reviewed his chart. On assessment, patient is laying on the floor sleeping. He is easily awaken, he denies any acute distress. He is oriented X4. No evidence of psychosis, mania, or delusional thought content noted.  Patient reports "I need help, I just need a place to stay, I need to lay my head down."  He says he has not been sleeping well. When informed by this writer that needing shelter is not a criteria for admission patient then reports "I'm suicidal then, I want to drink bleach. My belly is hurting and I'm having nausea" He rates abdominal pain at 7/10 but then states "it doesn't really hurt, it is because of the alcohol use. I don't need anything for it." He declines offer for pain medication or transfer to ED for further evaluation of abdominal pain. He  says he is unable to identify any triggers  and says he does not know the duration of suicidal ideation. Patient appears to have secondary gain of housing. He has been evaluated multiple times by psychiatric services of similar complaints. He denies homicidal ideation, hallucination, and paranoia.  He reports drinking 1/5 of liquor daily and smoking marijuana "on and off." He denies all other substance use.     Psychiatric Specialty Exam  Presentation  General Appearance:Appropriate for Environment  Eye Contact:Good  Speech:Clear and Coherent  Speech Volume:Normal  Handedness:Right   Mood and Affect  Mood:Irritable  Affect:Congruent   Thought Process  Thought Processes:Goal Directed  Descriptions of Associations:Intact  Orientation:Full (Time, Place and Person)  Thought Content:WDL  Diagnosis of Schizophrenia or Schizoaffective disorder in past: No   Hallucinations:None  Ideas of Reference:None  Suicidal Thoughts:Yes, Passive With Plan; With Intent Without Intent  Homicidal Thoughts:No   Sensorium  Memory:Immediate Good; Recent Good; Remote Fair  Judgment:Fair  Insight:Good   Executive Functions  Concentration:Good  Attention Span:Good  [place]  Language:Good   Psychomotor Activity  Psychomotor Activity:Normal   Assets  Assets:Communication Skills; Desire for Improvement; Physical Health   Sleep  Sleep:Poor  Number of hours: 5   No data recorded  Physical Exam: Physical Exam Vitals and nursing note reviewed.  Constitutional:      General: He is not in acute distress.    Appearance: He is well-developed. He is not ill-appearing or diaphoretic.  HENT:     Head: Normocephalic and atraumatic.  Eyes:     Conjunctiva/sclera: Conjunctivae normal.  Cardiovascular:     Rate and Rhythm: Normal rate.  Pulmonary:     Effort: Pulmonary effort is normal. No respiratory distress.     Breath sounds: Normal breath sounds.  Abdominal:     Palpations: Abdomen is soft.     Tenderness: There is no abdominal tenderness.  Musculoskeletal:        General:  No swelling.     Cervical back: Neck supple.  Skin:    General: Skin is warm and dry.     Capillary Refill: Capillary refill takes less than 2 seconds.  Neurological:     Mental Status: He is alert and oriented to person, place, and time.  Psychiatric:         Attention and Perception: Attention and perception normal.        Mood and Affect: Mood normal.        Speech: Speech normal.        Behavior: Behavior normal. Behavior is cooperative.        Thought Content: Thought content is not paranoid or delusional. Thought content does not include homicidal ideation. Thought content does not include homicidal plan.    Review of Systems  Constitutional: Negative.   HENT: Negative.    Eyes: Negative.   Respiratory: Negative.    Cardiovascular: Negative.   Gastrointestinal: Negative.   Genitourinary: Negative.   Musculoskeletal: Negative.   Skin: Negative.   Neurological: Negative.   Endo/Heme/Allergies: Negative.   Psychiatric/Behavioral:  Positive for substance abuse. The patient is nervous/anxious.    There were no vitals taken for this visit. There is no height or weight on file to calculate BMI.  Musculoskeletal: Strength & Muscle Tone: within normal limits Gait & Station: normal Patient leans: Right   [place] [facility] Discharge Disposition for Follow up and Recommendations: Based on my evaluation the patient does not appear to have an emergency medical condition and can be discharged with resources and follow up care in outpatient services for Medication Management, Substance Abuse Intensive Outpatient Program, and Individual Therapy  Patient with history of homelessness. He has been evaluated multiple time by psychiatric services with similar complaint and have been given resources on several occasions. Patient appears to have secondary gain of housing and he is using psychiatric services and EDs for shelter/housing.   No evidence of imminent danger to self or others at this time. Patient does not meet criteria for psychiatric admission or IVC. Supportive therapy provided about ongoing stressors. Discussed crisis plan, callling [phone] or going to Emergency Dept    Ophelia Shoulder, NP 05/10/2022, 4:02 AM

## 2022-05-10 NOTE — Progress Notes (Signed)
TRIAGE: ROUTINE   05/10/22 0248  [facility] Triage Screening (Walk-ins at [facility] only)  How Did You Hear About [place]? Self  What Is the Reason for Your Visit/Call Today? Pt was being d/c from [facility] and requested [facility] bring him to the [facility]. Upon entering the assessment room, pt has turned off the light and is lying on the floor. He is casually dressed. He is able to answer the questions posed, though his speech is slurred. Pt states he is here because he is "depressed and suicidal." Pt states he has a plan to kill himself, stating he would hang himself but he has no cords in which to do so. Pt states that 2 months ago he attempted to kill himself by drinking 1/2 gallon of bleach; he denies he's ever been hospitalized for mental health concerns. Pt denies HI, AVH, NSSIB, access to guns/weapons, and engagement with the legal system. Pt states he has been drinking 1/2 of 1/5 of liquor daily and that he last drank tonight. Pt states that he experiences an upset stomach when he doesn't drink; he denies he has ever experienced seizures or the loss of consciousness.  How Long Has This Been Causing You Problems? 1 wk - 1 month  Have You Recently Had Any Thoughts About Hurting Yourself? Yes  How long ago did you have thoughts about hurting yourself? Currently  Are You Planning to Commit Suicide/Harm Yourself At This time? No  Have you Recently Had Thoughts About Hurting Someone [name]? No  Are You Planning To Harm Someone At This Time? No  Are you currently experiencing any auditory, visual or other hallucinations? No  Have You Used Any Alcohol or Drugs in the Past 24 Hours? Yes  How long ago did you use Drugs or Alcohol? Pt states he has been drinking 1/2 of 1/5 of liquor daily and that he last drank tonight. Pt states that he experiences an upset stomach when he doesn't drink; he denies he has ever experienced seizures or the loss of consciousness.  What Did You Use and How Much? Pt states he has been drinking 1/2 of 1/5 of  liquor daily and that he last drank tonight. Pt states that he experiences an upset stomach when he doesn't drink; he denies he has ever experienced seizures or the loss of consciousness.  Do you have any current medical co-morbidities that require immediate attention? No  Please describe current medical co-morbidities that require immediate attention: Pt denies  Clinician description of patient physical appearance/behavior: Upon entering the assessment room, pt has turned off the light and is lying on the floor. He is casually dressed. He is able to answer the questions posed, though his speech is slurred.  What Do You Feel Would Help You the Most Today? Treatment for Depression or other mood problem  If access to [facility] was not available, would you have sought care in the Emergency Department? Yes  Determination of Need Routine (7 days)  Options For Referral Medication Management;Chemical Dependency Intensive Outpatient Therapy (CDIOP);Outpatient Therapy

## 2022-05-10 NOTE — ED Triage Notes (Signed)
Pt BIB [facility] from [place] [facility].Pt reports having anxiety that isn't being fixed with meds. Pt denies SI. C/o depression

## 2022-05-10 NOTE — ED Provider Notes (Signed)
[facility]EMERGENCY DEPT Provider Note   CSN: [id number] Arrival date & time: 05/10/22  0015     History  Chief Complaint  Patient presents with   Anxiety    William Powers is a 41 y.o. male.  HPI  Patient with medical history including hypertension, HIV, heart failure, polysubstance dependency, passive suicidal ideations presents  with complaints of suicidal ideations.  Patient states that he is here because  no longer wants to live.  He endorses that he would try to strangle himself, drink bleach, cut himself.  He denies any illicit drug use, he denies any other complaints.  He states that he has not been taking his depression medications because he does not have the money for.  I reviewed patient's chart he has been seen here extensively for similar presentations, most recently was seen here 4 days ago, and provides the same story as before as well as on other occasions.  Was seen by behavioral health and was discharged that day.  Home Medications Prior to Admission medications   Medication Sig Start Date End Date Taking? Authorizing Provider  BIKTARVY 50-200-25 MG TABS tablet Take 1 tablet by mouth daily. Patient not taking: Reported on 03/01/2022 11/18/21   Melene Plan, DO  escitalopram (LEXAPRO) 10 MG tablet Take 1 tablet (10 mg total) by mouth daily. Patient not taking: Reported on 05/07/2022 05/04/22   Ardis Hughs, NP  metoprolol succinate (TOPROL-XL) 50 MG 24 hr tablet Take 1 tablet by mouth 2 times daily. Patient not taking: Reported on 03/01/2022 11/18/21   Melene Plan, DO  Potassium Chloride ER 20 MEQ TBCR Take 20 mEq by mouth daily at 6 (six) AM. Patient not taking: No sig reported 08/07/21 09/05/21  Bobette Mo, MD      Allergies    Bee venom, Lisinopril, and Sulfamethoxazole-trimethoprim    Review of Systems   Review of Systems  Constitutional:  Negative for chills and fever.  Respiratory:  Negative for shortness of breath.    Cardiovascular:  Negative for chest pain.  Gastrointestinal:  Negative for abdominal pain.  Neurological:  Negative for headaches.    Physical Exam Updated Vital Signs BP 119/84 (BP Location: Right Arm)   Pulse 94   Temp 98.1 F (36.7 C) (Oral)   Resp 17   Ht 6\' 4"  (1.93 m)   Wt 108 kg   SpO2 95%   BMI 28.97 kg/m  Physical Exam Vitals and nursing note reviewed.  Constitutional:      General: He is not in acute distress.    Appearance: He is not ill-appearing.  HENT:     Head: Normocephalic and atraumatic.     Nose: No congestion.  Eyes:     Conjunctiva/sclera: Conjunctivae normal.  Cardiovascular:     Rate and Rhythm: Normal rate and regular rhythm.     Pulses: Normal pulses.     Heart sounds: No murmur heard.    No friction rub. No gallop.  Pulmonary:     Effort: No respiratory distress.     Breath sounds: No wheezing, rhonchi or rales.  Skin:    General: Skin is warm and dry.  Neurological:     Mental Status: He is alert.     Comments: No facial asymmetry no difficulty word finding following two-step commands no weakness present.  Psychiatric:        Mood and Affect: Mood normal.     Comments: Patient answered all questions appropriately, does not appear to  be respond to internal stimuli.  Endorses suicidal ideations without homicidal ideations.     ED Results / Procedures / Treatments   Labs (all labs ordered are listed, but only abnormal results are displayed) Labs Reviewed - No data to display  EKG None  Radiology No results found.  Procedures Procedures    Medications Ordered in ED Medications - No data to display  ED Course/ Medical Decision Making/ A&P                           Medical Decision Making  This patient presents to the ED for concern of suicide ideation, this involves an extensive number of treatment options, and is a complaint that carries with it a high risk of complications and morbidity.  The differential diagnosis  includes psychiatric emergency, withdrawals, electrode derailments    Additional history obtained:  Additional history obtained from N/A External records from outside source obtained and reviewed including N/A   Co morbidities that complicate the patient evaluation  Psychiatric disorder  Social Determinants of Health:  N/A    Lab Tests:  I Ordered, and personally interpreted labs.  The pertinent results include: N/A   Imaging Studies ordered:  I ordered imaging studies including N/A I independently visualized and interpreted imaging which showed N/A I agree with the radiologist interpretation   Cardiac Monitoring:  The patient was maintained on a cardiac monitor.  I personally viewed and interpreted the cardiac monitored which showed an underlying rhythm of: N/A   Medicines ordered and prescription drug management:  I ordered medication including N/A I have reviewed the patients home medicines and have made adjustments as needed  Critical Interventions:  N/A   Reevaluation:  Patient had a benign physical exam, agreement plan discharge.  Consultations Obtained:  N/A   Test Considered:  ED observation-deferred as patient is medically stable, patient has been having passive stated ideations for years, was seen by behavioral health 4 days ago and was discharged that day, no acute psychiatric abnormality stable for discharge.    Rule out Low suspicion for psychiatric emergency does not appear to responding to internal stimuli, not endorsing homicidal ideations, no acute psychiatric abnormalities present.  Low suspicion for systemic infection patient nontoxic-appearing vital signs reassuring.  Low suspicion for withdrawals nontremulous vital signs reassuring.    Dispostion and problem list  After consideration of the diagnostic results and the patients response to treatment, I feel that the patent would benefit from discharge.  Suicide ideation-likely  this is multifactorial, patient provides same passive ideation for suicide ideations, been seen by psych and recently cleared, also feel that there is a malingering component, will provide him with outpatient resources.            Final Clinical Impression(s) / ED Diagnoses Final diagnoses:  Suicide ideation  Malingering    Rx / DC Orders ED Discharge Orders          Ordered    Ambulatory referral to Social Work       Comments: Help with receiving medications.   05/10/22 0157              Carroll Sage, PA-C 05/10/22 0158    Sloan Leiter, DO 05/14/22 1611

## 2022-05-10 NOTE — Discharge Instructions (Signed)
Please continue with all home medications.  Follow-up with behavioral health.  Come back to the emergency department if you develop chest pain, shortness of breath, severe abdominal pain, uncontrolled nausea, vomiting, diarrhea.

## 2022-05-10 NOTE — ED Notes (Signed)
Patient advised he is being discharged. Patient states he doesn't have transportation and was brought in by the police.  [employer] has been called for transport.

## 2022-05-10 NOTE — Discharge Instructions (Signed)
You need to follow-up as an outpatient.  Can follow-up with [facility] or see one of the local therapy centers.

## 2022-05-15 ENCOUNTER — Other Ambulatory Visit: Payer: Self-pay

## 2022-05-15 ENCOUNTER — Encounter (HOSPITAL_COMMUNITY): Payer: Self-pay | Admitting: Emergency Medicine

## 2022-05-15 ENCOUNTER — Emergency Department (HOSPITAL_COMMUNITY): Payer: Medicaid Other

## 2022-05-15 ENCOUNTER — Emergency Department (HOSPITAL_COMMUNITY)
Admission: EM | Admit: 2022-05-15 | Discharge: 2022-05-15 | Disposition: A | Discharge status: Home / Self Care | Payer: Medicaid Other | Attending: Emergency Medicine | Admitting: Emergency Medicine

## 2022-05-15 DIAGNOSIS — R110 Nausea: Secondary | ICD-10-CM | POA: Insufficient documentation

## 2022-05-15 DIAGNOSIS — I110 Hypertensive heart disease with heart failure: Secondary | ICD-10-CM | POA: Insufficient documentation

## 2022-05-15 DIAGNOSIS — R079 Chest pain, unspecified: Secondary | ICD-10-CM | POA: Insufficient documentation

## 2022-05-15 DIAGNOSIS — Z79899 Other long term (current) drug therapy: Secondary | ICD-10-CM | POA: Insufficient documentation

## 2022-05-15 DIAGNOSIS — Z21 Asymptomatic human immunodeficiency virus [HIV] infection status: Secondary | ICD-10-CM | POA: Insufficient documentation

## 2022-05-15 DIAGNOSIS — Y903 Blood alcohol level of 60-79 mg/100 ml: Secondary | ICD-10-CM | POA: Insufficient documentation

## 2022-05-15 DIAGNOSIS — D72819 Decreased white blood cell count, unspecified: Secondary | ICD-10-CM | POA: Diagnosis not present

## 2022-05-15 DIAGNOSIS — E876 Hypokalemia: Secondary | ICD-10-CM | POA: Insufficient documentation

## 2022-05-15 DIAGNOSIS — R002 Palpitations: Secondary | ICD-10-CM | POA: Diagnosis not present

## 2022-05-15 DIAGNOSIS — R0602 Shortness of breath: Secondary | ICD-10-CM | POA: Diagnosis not present

## 2022-05-15 DIAGNOSIS — I509 Heart failure, unspecified: Secondary | ICD-10-CM | POA: Diagnosis not present

## 2022-05-15 DIAGNOSIS — F1410 Cocaine abuse, uncomplicated: Secondary | ICD-10-CM

## 2022-05-15 LAB — RAPID URINE DRUG SCREEN, HOSP PERFORMED
Amphetamines: NOT DETECTED
Barbiturates: NOT DETECTED
Benzodiazepines: NOT DETECTED
Cocaine: POSITIVE — AB
Opiates: NOT DETECTED
Tetrahydrocannabinol: POSITIVE — AB

## 2022-05-15 LAB — BASIC METABOLIC PANEL
Anion gap: 9 (ref 5–15)
BUN: 11 mg/dL (ref 6–20)
CO2: 21 mmol/L — ABNORMAL LOW (ref 22–32)
Calcium: 8.7 mg/dL — ABNORMAL LOW (ref 8.9–10.3)
Chloride: 110 mmol/L (ref 98–111)
Creatinine, Ser: 1.03 mg/dL (ref 0.61–1.24)
GFR, Estimated: >60 mL/min (ref >60)
Glucose, Bld: 80 mg/dL (ref 70–99)
Potassium: 3.1 mmol/L — ABNORMAL LOW (ref 3.5–5.1)
Sodium: 140 mmol/L (ref 135–145)

## 2022-05-15 LAB — CBC WITH DIFFERENTIAL/PLATELET
Abs Immature Granulocytes: 0.01 10*3/uL (ref 0.00–0.07)
Basophils Absolute: 0 10*3/uL (ref 0.0–0.1)
Basophils Relative: 1 %
Eosinophils Absolute: 0 10*3/uL (ref 0.0–0.5)
Eosinophils Relative: 1 %
HCT: 38.8 % — ABNORMAL LOW (ref 39.0–52.0)
Hemoglobin: 13.4 g/dL (ref 13.0–17.0)
Immature Granulocytes: 0 %
Lymphocytes Relative: 36 %
Lymphs Abs: 1 10*3/uL (ref 0.7–4.0)
MCH: 31.2 pg (ref 26.0–34.0)
MCHC: 34.5 g/dL (ref 30.0–36.0)
MCV: 90.4 fL (ref 80.0–100.0)
Monocytes Absolute: 0.4 10*3/uL (ref 0.1–1.0)
Monocytes Relative: 13 %
Neutro Abs: 1.4 10*3/uL — ABNORMAL LOW (ref 1.7–7.7)
Neutrophils Relative %: 49 %
Platelets: 259 10*3/uL (ref 150–400)
RBC: 4.29 MIL/uL (ref 4.22–5.81)
RDW: 13.2 % (ref 11.5–15.5)
WBC: 2.8 10*3/uL — ABNORMAL LOW (ref 4.0–10.5)
nRBC: 0 % (ref 0.0–0.2)

## 2022-05-15 LAB — TROPONIN I (HIGH SENSITIVITY)
Troponin I (High Sensitivity): 5 ng/L (ref <18)
Troponin I (High Sensitivity): 5 ng/L (ref <18)

## 2022-05-15 LAB — SALICYLATE LEVEL: Salicylate Lvl: <7 mg/dL — ABNORMAL LOW (ref 7.0–30.0)

## 2022-05-15 LAB — ACETAMINOPHEN LEVEL: Acetaminophen (Tylenol), Serum: <10 ug/mL — ABNORMAL LOW (ref 10–30)

## 2022-05-15 LAB — ETHANOL: Alcohol, Ethyl (B): 67 mg/dL — ABNORMAL HIGH (ref <10)

## 2022-05-15 MED ORDER — SODIUM CHLORIDE 0.9 % IV BOLUS
1000.0000 mL | Freq: Once | INTRAVENOUS | Status: AC
  Administered 2022-05-15: 1000 mL via INTRAVENOUS

## 2022-05-15 MED ORDER — POTASSIUM CHLORIDE CRYS ER 20 MEQ PO TBCR: 40.0000 meq | EXTENDED_RELEASE_TABLET | Freq: Once | ORAL | Status: DC

## 2022-05-15 MED ORDER — ESCITALOPRAM OXALATE 10 MG PO TABS: 10.0000 mg | ORAL_TABLET | Freq: Every day | ORAL | Status: DC

## 2022-05-15 NOTE — ED Notes (Signed)
Pt dressed out into wine scrubs, all belongings placed in pt belonging bag. Items include: black shirt, gray shorts, black doo-rag, phone and charger. Items placed at nurses' station, security called for pt to be wanded.

## 2022-05-15 NOTE — Discharge Instructions (Addendum)
Please avoid using cocaine as it can cause chest pain and negative effects on your heart.  If your suicidal ideations return you can go to the behavioral health urgent care or return to the emergency department.  If you develop worsening chest pain, passout, have shortness of breath or difficulty breathing or other new or concerning symptoms please return for reevaluation.

## 2022-05-15 NOTE — ED Notes (Signed)
Pt now awake and talking, states he needs to leave the hospital "to go kill himself". Provider notified, and pt's door/curtains open to nurses' station, visible to staff

## 2022-05-15 NOTE — ED Notes (Signed)
IVC rescinded by provider. Cleared for discharge. Pt is alert and oriented x 4. Denies SI/HI or AV hallucinations at this time.

## 2022-05-15 NOTE — ED Triage Notes (Signed)
Pt in via [facility] with reported overdose on cocaine and ecstasy last night possibly at 7pm. Pt reporting cp, [facility] 14 on arrival. VSS

## 2022-05-15 NOTE — ED Provider Notes (Signed)
Care assumed from PA [facility] at shift change, please see his note for full details, but in brief William Powers is a 41 y.o. male who presents with chest pain after using cocaine and marijuana. Workup for chest pain has reassuring so far, no EKG changes, delta trop pending.  During encoutner pt began to report suicidal ideation but was trying to leave, so was placed under IVC, with plan for TTS consult once medically cleared.  Plan: follow up detla trop, TTS once medically cleared  Physical Exam  BP (!) 137/94   Pulse 84   Temp 98.7 F (37.1 C) (Oral)   Resp 20   Wt 108 kg   SpO2 94%   BMI 28.97 kg/m   Physical Exam Vitals and nursing note reviewed.  Constitutional:      General: He is not in acute distress.    Appearance: Normal appearance. He is well-developed. He is not ill-appearing or diaphoretic.  HENT:     Head: Normocephalic and atraumatic.  Eyes:     General:        Right eye: No discharge.        Left eye: No discharge.  Cardiovascular:     Rate and Rhythm: Normal rate and regular rhythm.  Pulmonary:     Effort: Pulmonary effort is normal. No respiratory distress.  Neurological:     Mental Status: He is alert and oriented to person, place, and time.     Coordination: Coordination normal.  Psychiatric:        Attention and Perception: Attention normal.        Mood and Affect: Mood and affect normal.        Speech: Speech normal.        Behavior: Behavior normal.        Thought Content: Thought content does not include homicidal or suicidal ideation. Thought content does not include homicidal or suicidal plan.     Procedures  Procedures  ED Course / MDM   Clinical Course as of 05/15/22 0643  Thu May 15, 2022  0412 I was contacted by patient's RN with reports that patient has no exertional suicidal ideation and wanting to leave the emergency department.  When I spoke to the patient he endorses suicidal ideation with intent to follow through on  his ideations.  When asked what his plan is patient states "I know what to do."  We will take out IVC paperwork and add additional medical clearance labs. [PB]    Clinical Course User Index [PB] Haskel Schroeder, PA-C   Medical Decision Making Amount and/or Complexity of Data Reviewed Labs: ordered. Radiology: ordered.  Risk Prescription drug management.   I have reviewed and interpreted patient's labs, initial work-up was reassuring and delta troponin is negative.  On reevaluation patient reports that his chest pain has resolved.  He currently denies any suicidal or homicidal ideations or any auditory or visual hallucinations.  In response to questioning about his suicidal ideations earlier he reports "I was just high, I do not have suicidal ideations and do not plan to hurt myself".  Patient reports now he is just hungry and would like to go home.  Patient has had multiple recent behavioral health evaluations that have not recommended inpatient hospitalization and given that he currently denies suicidal ideations after sobering from intoxication with cocaine and marijuana feel he is appropriate for discharge home.  IVC has been rescinded and patient has been provided resources for outpatient follow-up.  Return  precautions discussed, discharged in good condition.       Dartha Lodge, PA-C 05/15/22 0762    Milagros Loll, MD 05/16/22 [phone]

## 2022-05-15 NOTE — ED Provider Notes (Signed)
[facility] EMERGENCY DEPARTMENT Provider Note   CSN: [id number] Arrival date & time: 05/15/22  7829     History  Chief Complaint  Patient presents with   Chest Pain    William Powers is a 41 y.o. male with a history of hypertension, heart failure with reduced EF, HIV (noncompliant with Biktarvy) polysubstance abuse, PE (not on any anticoagulation).  Presents to the emergency department with a complaint of chest pain and drug use.  Patient reports that today at approximately 9 PM he took 3 ecstasy pills and did cocaine.  Patient is unsure the amount of cocaine he used.  Patient states that around 11 PM he started having chest pain.  Pain is located to the left side of his chest and does not radiate.  When asked to describe his pain patient states "feel like my heart is beating fast."  Patient endorses associated shortness of breath and nausea.  Patient reports that he smokes Black and milds cigars.  Patient reports taking ecstasy and cocaine due to his "drug problem."  Patient denies any attempt to harm himself by drug use today.  Patient denies any leg swelling or tenderness, hemoptysis, lightheadedness, syncope, vomiting, abdominal pain, suicidal ideations, homicidal ideations, surgery in the last 4 weeks, history of malignancy.     Chest Pain Associated symptoms: nausea and palpitations   Associated symptoms: no abdominal pain, no back pain, no dizziness, no fever, no headache, no shortness of breath and no vomiting        Home Medications Prior to Admission medications   Medication Sig Start Date End Date Taking? Authorizing Provider  BIKTARVY 50-200-25 MG TABS tablet Take 1 tablet by mouth daily. Patient not taking: Reported on 03/01/2022 11/18/21   Melene Plan, DO  escitalopram (LEXAPRO) 10 MG tablet Take 1 tablet (10 mg total) by mouth daily. Patient not taking: Reported on 05/07/2022 05/04/22   Ardis Hughs, NP  metoprolol succinate (TOPROL-XL)  50 MG 24 hr tablet Take 1 tablet by mouth 2 times daily. Patient not taking: Reported on 03/01/2022 11/18/21   Melene Plan, DO  Potassium Chloride ER 20 MEQ TBCR Take 20 mEq by mouth daily at 6 (six) AM. Patient not taking: No sig reported 08/07/21 09/05/21  Bobette Mo, MD      Allergies    Bee venom, Lisinopril, and Sulfamethoxazole-trimethoprim    Review of Systems   Review of Systems  Constitutional:  Negative for chills and fever.  Eyes:  Negative for visual disturbance.  Respiratory:  Negative for shortness of breath.   Cardiovascular:  Positive for chest pain and palpitations. Negative for leg swelling.  Gastrointestinal:  Positive for nausea. Negative for abdominal pain and vomiting.  Musculoskeletal:  Negative for back pain and neck pain.  Skin:  Negative for color change and rash.  Neurological:  Negative for dizziness, syncope, light-headedness and headaches.  Psychiatric/Behavioral:  Negative for confusion and suicidal ideas.     Physical Exam Updated Vital Signs Temp 98.7 F (37.1 C) (Oral)   Wt 108 kg   BMI 28.97 kg/m  Physical Exam Vitals and nursing note reviewed.  Constitutional:      General: He is not in acute distress.    Appearance: He is not ill-appearing, toxic-appearing or diaphoretic.  HENT:     Head: Normocephalic.  Eyes:     General: No scleral icterus.       Right eye: No discharge.        Left eye: No  discharge.  Cardiovascular:     Rate and Rhythm: Normal rate.     Pulses:          Radial pulses are 2+ on the right side and 2+ on the left side.     Heart sounds: Normal heart sounds, S1 normal and S2 normal. No murmur heard. Pulmonary:     Effort: Pulmonary effort is normal. No tachypnea, bradypnea or respiratory distress.     Breath sounds: Normal breath sounds.  Abdominal:     General: Abdomen is flat. There is no distension. There are no signs of injury.     Palpations: Abdomen is soft. There is no mass or pulsatile mass.      Tenderness: There is no abdominal tenderness. There is no guarding or rebound.  Musculoskeletal:     Right lower leg: No swelling, deformity, lacerations, tenderness or bony tenderness. No edema.     Left lower leg: No swelling, deformity, lacerations, tenderness or bony tenderness. No edema.  Skin:    General: Skin is warm and dry.  Neurological:     General: No focal deficit present.     Mental Status: He is alert.  Psychiatric:        Behavior: Behavior is cooperative.        Thought Content: Thought content does not include homicidal or suicidal ideation. Thought content does not include homicidal or suicidal plan.     ED Results / Procedures / Treatments   Labs (all labs ordered are listed, but only abnormal results are displayed) Labs Reviewed  BASIC METABOLIC PANEL - Abnormal; Notable for the following components:      Result Value   Potassium 3.1 (*)    CO2 21 (*)    Calcium 8.7 (*)    All other components within normal limits  CBC WITH DIFFERENTIAL/PLATELET - Abnormal; Notable for the following components:   WBC 2.8 (*)    HCT 38.8 (*)    Neutro Abs 1.4 (*)    All other components within normal limits  ETHANOL - Abnormal; Notable for the following components:   Alcohol, Ethyl (B) 67 (*)    All other components within normal limits  ACETAMINOPHEN LEVEL - Abnormal; Notable for the following components:   Acetaminophen (Tylenol), Serum <10 (*)    All other components within normal limits  SALICYLATE LEVEL - Abnormal; Notable for the following components:   Salicylate Lvl <7.0 (*)    All other components within normal limits  RAPID URINE DRUG SCREEN, HOSP PERFORMED  TROPONIN I (HIGH SENSITIVITY)  TROPONIN I (HIGH SENSITIVITY)    EKG EKG Interpretation  Date/Time:  Thursday May 15 2022 03:32:37 EDT Ventricular Rate:  95 PR Interval:  177 QRS Duration: 96 QT Interval:  357 QTC Calculation: 449 R Axis:   58 Text Interpretation: Sinus rhythm Consider left  atrial enlargement ST elev, probable normal early repol pattern When compared with ECG of 05/01/2022, No significant change was found Confirmed by Dione Booze ([id number]) on 05/15/2022 3:38:31 AM  Radiology DG Chest Portable 1 View  Result Date: 05/15/2022 CLINICAL DATA:  Chest pain EXAM: PORTABLE CHEST 1 VIEW COMPARISON:  04/25/2022 FINDINGS: Lungs are clear.  No pleural effusion or pneumothorax. The heart is normal in size. IMPRESSION: No evidence of acute cardiopulmonary disease. Electronically Signed   By: Charline Bills M.D.   On: 05/15/2022 03:59    Procedures Procedures    Medications Ordered in ED Medications  potassium chloride SA (KLOR-CON M) CR tablet 40 mEq (  has no administration in time range)  sodium chloride 0.9 % bolus 1,000 mL (1,000 mLs Intravenous New Bag/Given 05/15/22 0450)    ED Course/ Medical Decision Making/ A&P Clinical Course as of 05/15/22 0546  Thu May 15, 2022  0412 I was contacted by patient's RN with reports that patient has no exertional suicidal ideation and wanting to leave the emergency department.  When I spoke to the patient he endorses suicidal ideation with intent to follow through on his ideations.  When asked what his plan is patient states "I know what to do."  We will take out IVC paperwork and add additional medical clearance labs. [PB]    Clinical Course User Index [PB] Haskel Schroeder, PA-C                           Medical Decision Making Amount and/or Complexity of Data Reviewed Labs: ordered. Radiology: ordered.  Risk Prescription drug management.   Alert 41 year old male in no acute distress, nontoxic-appearing.  Presents emergency department complaining of chest pain and drug use.  Information was obtained from patient.  I reviewed patient's past medical records including previous prior notes, labs, and imaging.  Patient has medical history as outlined in HPI which complicates his care.  With reports of chest pain will  initiate ACS work-up.  With reports of drug use will check UDS and ethanol.  I personally viewed and interpreted patient's EKG.  Tracing shows sinus rhythm with normal early repole pattern.  I personally viewed and interpreted patient's chest x-ray.  Imaging shows no active cardiopulmonary disease.  I personally viewed and interpreted patient's lab results.  Pertinent findings include: -Potassium 3.1 -Acetaminophen and salicylate level unremarkable -Ethanol 67 -Leukopenia 2.8, appears baseline for patient likely secondary to his HIV.  Patient ordered potassium.  Will need to check second troponin before patient can be medically cleared.  Once patient medically cleared will consult TTS for psych evaluation.  IVC paperwork is filed at this time.  Patient's home Lexapro has been reordered.  Patient has been off his Biktarvy and metoprolol for extended periods of time and therefore we will not reorder at this time.  Patient care transferred to PA Ford at the end of my shift. Patient presentation, ED course, and plan of care discussed with review of all pertinent labs and imaging. Please see his/her note for further details regarding further ED course and disposition.         Final Clinical Impression(s) / ED Diagnoses Final diagnoses:  None    Rx / DC Orders ED Discharge Orders     None         William Powers 05/15/22 0645    Dione Booze, MD 05/15/22 [phone]

## 2022-05-19 ENCOUNTER — Other Ambulatory Visit: Payer: Self-pay

## 2022-05-19 ENCOUNTER — Emergency Department (HOSPITAL_COMMUNITY)
Admission: EM | Admit: 2022-05-19 | Discharge: 2022-05-20 | Disposition: A | Discharge status: Home / Self Care | Payer: Medicaid Other | Attending: Emergency Medicine | Admitting: Emergency Medicine

## 2022-05-19 ENCOUNTER — Encounter (HOSPITAL_COMMUNITY): Payer: Self-pay | Admitting: Emergency Medicine

## 2022-05-19 DIAGNOSIS — Z5321 Procedure and treatment not carried out due to patient leaving prior to being seen by health care provider: Secondary | ICD-10-CM | POA: Diagnosis not present

## 2022-05-19 DIAGNOSIS — Z760 Encounter for issue of repeat prescription: Secondary | ICD-10-CM | POA: Insufficient documentation

## 2022-05-19 NOTE — ED Triage Notes (Signed)
Patient needs asthma inhaler script refilled.  Patient in NAD.

## 2022-05-20 NOTE — ED Notes (Signed)
Pt told registration he was leaving due to wait time

## 2022-05-24 ENCOUNTER — Emergency Department (HOSPITAL_COMMUNITY)
Admission: EM | Admit: 2022-05-24 | Discharge: 2022-05-24 | Disposition: A | Discharge status: Home / Self Care | Payer: Medicaid Other | Attending: Emergency Medicine | Admitting: Emergency Medicine

## 2022-05-24 ENCOUNTER — Other Ambulatory Visit: Payer: Self-pay

## 2022-05-24 DIAGNOSIS — I110 Hypertensive heart disease with heart failure: Secondary | ICD-10-CM | POA: Diagnosis not present

## 2022-05-24 DIAGNOSIS — R0602 Shortness of breath: Secondary | ICD-10-CM | POA: Insufficient documentation

## 2022-05-24 DIAGNOSIS — Z21 Asymptomatic human immunodeficiency virus [HIV] infection status: Secondary | ICD-10-CM | POA: Diagnosis not present

## 2022-05-24 DIAGNOSIS — I509 Heart failure, unspecified: Secondary | ICD-10-CM | POA: Insufficient documentation

## 2022-05-24 DIAGNOSIS — F1910 Other psychoactive substance abuse, uncomplicated: Secondary | ICD-10-CM | POA: Diagnosis not present

## 2022-05-24 DIAGNOSIS — Z79899 Other long term (current) drug therapy: Secondary | ICD-10-CM | POA: Diagnosis not present

## 2022-05-24 MED ORDER — ALBUTEROL SULFATE HFA 108 (90 BASE) MCG/ACT IN AERS
2.0000 | INHALATION_SPRAY | Freq: Once | RESPIRATORY_TRACT | Status: AC
  Administered 2022-05-24: 2 via RESPIRATORY_TRACT
  Filled 2022-05-24: qty 6.7

## 2022-05-24 NOTE — ED Triage Notes (Addendum)
Pt endorses smoking this afternoon, not having an inhaler, and c/o SOB. Endorses doing cocaine, ecstasy, fent. and percocet's today. States "I did plenty. Love alcohol and drugs" Pt laying on the floor in triage. Denies CP/cough. Denies SI, states he likes to do drugs to take the edge off.
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
## 2022-06-06 ENCOUNTER — Other Ambulatory Visit: Payer: Self-pay

## 2022-06-06 ENCOUNTER — Emergency Department (HOSPITAL_COMMUNITY)
Admission: EM | Admit: 2022-06-06 | Discharge: 2022-06-06 | Disposition: A | Discharge status: Home / Self Care | Payer: Medicaid Other | Attending: Emergency Medicine | Admitting: Emergency Medicine

## 2022-06-06 ENCOUNTER — Encounter (HOSPITAL_COMMUNITY): Payer: Self-pay | Admitting: Emergency Medicine

## 2022-06-06 DIAGNOSIS — R109 Unspecified abdominal pain: Secondary | ICD-10-CM | POA: Diagnosis present

## 2022-06-06 DIAGNOSIS — R1110 Vomiting, unspecified: Secondary | ICD-10-CM | POA: Insufficient documentation

## 2022-06-06 DIAGNOSIS — Z5321 Procedure and treatment not carried out due to patient leaving prior to being seen by health care provider: Secondary | ICD-10-CM | POA: Insufficient documentation

## 2022-06-06 DIAGNOSIS — R197 Diarrhea, unspecified: Secondary | ICD-10-CM | POA: Diagnosis not present

## 2022-06-06 LAB — COMPREHENSIVE METABOLIC PANEL
ALT: 44 U/L (ref 0–44)
AST: 50 U/L — ABNORMAL HIGH (ref 15–41)
Albumin: 3.5 g/dL (ref 3.5–5.0)
Alkaline Phosphatase: 62 U/L (ref 38–126)
Anion gap: 8 (ref 5–15)
BUN: 14 mg/dL (ref 6–20)
CO2: 20 mmol/L — ABNORMAL LOW (ref 22–32)
Calcium: 8.6 mg/dL — ABNORMAL LOW (ref 8.9–10.3)
Chloride: 109 mmol/L (ref 98–111)
Creatinine, Ser: 0.94 mg/dL (ref 0.61–1.24)
GFR, Estimated: >60 mL/min (ref >60)
Glucose, Bld: 92 mg/dL (ref 70–99)
Potassium: 3.5 mmol/L (ref 3.5–5.1)
Sodium: 137 mmol/L (ref 135–145)
Total Bilirubin: 1.1 mg/dL (ref 0.3–1.2)
Total Protein: 8 g/dL (ref 6.5–8.1)

## 2022-06-06 LAB — CBC
HCT: 41.7 % (ref 39.0–52.0)
Hemoglobin: 14.3 g/dL (ref 13.0–17.0)
MCH: 31.2 pg (ref 26.0–34.0)
MCHC: 34.3 g/dL (ref 30.0–36.0)
MCV: 91 fL (ref 80.0–100.0)
Platelets: 267 10*3/uL (ref 150–400)
RBC: 4.58 MIL/uL (ref 4.22–5.81)
RDW: 13.1 % (ref 11.5–15.5)
WBC: 4.6 10*3/uL (ref 4.0–10.5)
nRBC: 0 % (ref 0.0–0.2)

## 2022-06-06 LAB — LIPASE, BLOOD: Lipase: 30 U/L (ref 11–51)

## 2022-06-06 MED ORDER — ONDANSETRON 4 MG PO TBDP
4.0000 mg | ORAL_TABLET | Freq: Once | ORAL | Status: AC | PRN
  Administered 2022-06-06: 4 mg via ORAL
  Filled 2022-06-06: qty 1

## 2022-06-06 NOTE — ED Notes (Signed)
Patient found in lobby sleep. Explained he would have to check in if he wanted to wait in the lobby

## 2022-06-06 NOTE — ED Triage Notes (Signed)
Patient reports mid abdominal pain with emesis and diarrhea onset last night .

## 2022-06-06 NOTE — ED Notes (Signed)
Attempted to take PT's vitals , PT became verbally abusive. PT  stated he had been here for 15 hours , PT was here for 4. PT became more upset snatched off his BP cuff and walked off.

## 2022-06-07 ENCOUNTER — Emergency Department (HOSPITAL_COMMUNITY)
Admission: EM | Admit: 2022-06-07 | Discharge: 2022-06-07 | Disposition: A | Discharge status: Home / Self Care | Payer: Medicaid Other | Attending: Emergency Medicine | Admitting: Emergency Medicine

## 2022-06-07 ENCOUNTER — Encounter (HOSPITAL_COMMUNITY): Payer: Self-pay | Admitting: Student

## 2022-06-07 DIAGNOSIS — I509 Heart failure, unspecified: Secondary | ICD-10-CM | POA: Insufficient documentation

## 2022-06-07 DIAGNOSIS — K0889 Other specified disorders of teeth and supporting structures: Secondary | ICD-10-CM | POA: Insufficient documentation

## 2022-06-07 DIAGNOSIS — Z21 Asymptomatic human immunodeficiency virus [HIV] infection status: Secondary | ICD-10-CM | POA: Insufficient documentation

## 2022-06-07 MED ORDER — AMOXICILLIN-POT CLAVULANATE 875-125 MG PO TABS: 1.0000 | ORAL_TABLET | Freq: Two times a day (BID) | ORAL | 0 refills | Status: DC

## 2022-06-07 MED ORDER — AMOXICILLIN-POT CLAVULANATE 875-125 MG PO TABS
1.0000 | ORAL_TABLET | Freq: Once | ORAL | Status: AC
  Administered 2022-06-07: 1 via ORAL
  Filled 2022-06-07: qty 1

## 2022-06-07 MED ORDER — LIDOCAINE VISCOUS HCL 2 % MT SOLN
15.0000 mL | Freq: Once | OROMUCOSAL | Status: AC
  Administered 2022-06-07: 15 mL via OROMUCOSAL
  Filled 2022-06-07: qty 15
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
## 2022-06-07 NOTE — ED Triage Notes (Signed)
Patient coming to ED for evaluation of "a spot in my mouth that I need cut off." No reports of dental pain.  Unable to recall when he developed area.  States "I can't even eat my food with it."

## 2022-06-07 NOTE — Discharge Instructions (Addendum)
Call one of the dentists offices provided to schedule an appointment for re-evaluation and further management within the next 48 hours.  ° °I have prescribed you Augmentin which is an antibiotic to treat the infection.  °Please take all of your antibiotics until finished. You may develop abdominal discomfort or diarrhea from the antibiotic.  You may help offset this with probiotics which you can buy at the store (ask your pharmacist if unable to find) or get probiotics in the form of eating yogurt. Do not eat or take the probiotics until 2 hours after your antibiotic. If you are unable to tolerate these side effects follow-up with your primary care provider or return to the emergency department.  ° °If you begin to experience any blistering, rashes, swelling, or difficulty breathing seek medical care for evaluation of potentially more serious side effects.  ° °We have prescribed you new medication(s) today. Discuss the medications prescribed today with your pharmacist as they can have adverse effects and interactions with your other medicines including over the counter and prescribed medications. Seek medical evaluation if you start to experience new or abnormal symptoms after taking one of these medicines, seek care immediately if you start to experience difficulty breathing, feeling of your throat closing, facial swelling, or rash as these could be indications of a more serious allergic reaction ° °If you start to experience and new or worsening symptoms return to the emergency department. If you start to experience fever, chills, neck stiffness/pain, or inability to move your neck or open your mouth come back to the emergency department immediately.  ° °

## 2022-06-07 NOTE — ED Notes (Signed)
Patient provided with comfort measures.

## 2022-06-09 ENCOUNTER — Emergency Department (HOSPITAL_COMMUNITY)
Admission: EM | Admit: 2022-06-09 | Discharge: 2022-06-10 | Disposition: A | Discharge status: Home / Self Care | Payer: Medicaid Other | Attending: Emergency Medicine | Admitting: Emergency Medicine

## 2022-06-09 ENCOUNTER — Encounter (HOSPITAL_COMMUNITY): Payer: Self-pay | Admitting: Emergency Medicine

## 2022-06-09 ENCOUNTER — Other Ambulatory Visit: Payer: Self-pay

## 2022-06-09 DIAGNOSIS — I10 Essential (primary) hypertension: Secondary | ICD-10-CM | POA: Diagnosis not present

## 2022-06-09 DIAGNOSIS — R300 Dysuria: Secondary | ICD-10-CM | POA: Diagnosis not present

## 2022-06-09 DIAGNOSIS — F17210 Nicotine dependence, cigarettes, uncomplicated: Secondary | ICD-10-CM | POA: Insufficient documentation

## 2022-06-09 DIAGNOSIS — Z21 Asymptomatic human immunodeficiency virus [HIV] infection status: Secondary | ICD-10-CM | POA: Insufficient documentation

## 2022-06-09 NOTE — ED Triage Notes (Signed)
Patient reports burning sensation when urinating and "yellowish string" penile discharge today .

## 2022-06-09 NOTE — ED Provider Triage Note (Signed)
Emergency Medicine Provider Triage Evaluation Note  William Powers , a 41 y.o. male  was evaluated in triage.  Pt complains of dysuria.  States it burns when he pees very badly and does have some penile discharge.  Denies hematuria.  States new sexual partner recently, he was wearing a condom.  Unclear if partner has symptoms. Known hx of HIV.   Review of Systems  Positive: Dysuria, penile discharge Negative: fever  Physical Exam  BP 119/86 (BP Location: Right Arm)   Pulse (!) 106   Temp 98.6 F (37 C) (Oral)   Resp 19   SpO2 99%   Gen:   Awake, no distress   Resp:  Normal effort  MSK:   Moves extremities without difficulty  Other:  Refused genital exam in triage  Medical Decision Making  Medically screening exam initiated at 11:26 PM.  Appropriate orders placed.  William Powers was informed that the remainder of the evaluation will be completed by another provider, this initial triage assessment does not replace that evaluation, and the importance of remaining in the ED until their evaluation is complete.  Will check UA and gc/chl.    Reports unable to provide UA in triage.  Sent to [facility] with urine specimen cup, will let staff know when sample given.   [name], PA-C 06/09/22 2329

## 2022-06-10 LAB — URINALYSIS, ROUTINE W REFLEX MICROSCOPIC
Bacteria, UA: NONE SEEN
Bilirubin Urine: NEGATIVE
Glucose, UA: NEGATIVE mg/dL
Ketones, ur: NEGATIVE mg/dL
Leukocytes,Ua: NEGATIVE
Nitrite: NEGATIVE
Protein, ur: NEGATIVE mg/dL
Specific Gravity, Urine: 1.001 — ABNORMAL LOW (ref 1.005–1.030)
pH: 5 (ref 5.0–8.0)

## 2022-06-10 NOTE — ED Notes (Signed)
Pt got up and stated he was leaving this RN told the pt he needed to wait for the official discharge and for papers. Pt stated yall can just send those to my my chart. This RN stated ok but what if they want to give you a rx that cannot be sent to your My chart. Pt stated "dr said I was good so I am fine." This RN made note and will discharge him from the system.

## 2022-06-10 NOTE — Discharge Instructions (Addendum)
You were evaluated in the Emergency Department and after careful evaluation, we did not find any emergent condition requiring admission or further testing in the hospital.  Your exam/testing today was overall reassuring.  Please return to the Emergency Department if you experience any worsening of your condition.  Thank you for allowing us to be a part of your care.  

## 2022-06-10 NOTE — ED Provider Notes (Signed)
[facility] Emergency Department Provider Note MRN:  [id number]  Arrival date & time: 06/10/22     Chief Complaint   Penile Discharge/ Dysuria   History of Present Illness   William Powers is a 41 y.o. year-old male with a history of HIV, pulmonary embolism presenting to the ED with chief complaint of penile discharge/dysuria.  Patient wants his urine tested, having some burning with urination.  No other complaints.  Review of Systems  A thorough review of systems was obtained and all systems are negative except as noted in the HPI and PMH.   Patient's Health History    Past Medical History:  Diagnosis Date   Heart failure (HCC)    HFrEF (heart failure with reduced ejection fraction) (HCC)    15% EF as of 2019   HIV (human immunodeficiency virus infection) (HCC)    Hypertension    Polysubstance abuse (HCC)    Pulmonary embolism (HCC)     Past Surgical History:  Procedure Laterality Date   HERNIA REPAIR      Family History  Problem Relation Age of Onset   Lupus Mother    HIV Mother    HIV Father     Social History   Socioeconomic History   Marital status: Divorced    Spouse name: Not on file   Number of children: Not on file   Years of education: Not on file   Highest education level: Not on file  Occupational History   Not on file  Tobacco Use   Smoking status: Some Days    Packs/day: 0.50    Types: Cigarettes, Cigars   Smokeless tobacco: Never  Vaping Use   Vaping Use: Never used  Substance and Sexual Activity   Alcohol use: Yes    Comment: 10 pints per week   Drug use: Yes    Types: Marijuana, Cocaine   Sexual activity: Not on file  Other Topics Concern   Not on file  Social History Narrative   Not on file   Social Determinants of Health   Financial Resource Strain: Not on file  Food Insecurity: Not on file  Transportation Needs: Not on file  Physical Activity: Not on file  Stress: Not on file  Social  Connections: Not on file  Intimate Partner Violence: Not on file     Physical Exam   Vitals:   06/10/22 0316 06/10/22 0611  BP: 120/78 116/68  Pulse: 99 85  Resp: 16 17  Temp: 98 F (36.7 C) (!) 97 F (36.1 C)  SpO2: 98% 98%    CONSTITUTIONAL: Well-appearing, NAD NEURO/PSYCH:  Alert and oriented x 3, no focal deficits EYES:  eyes equal and reactive ENT/NECK:  no LAD, no JVD CARDIO: Regular rate, well-perfused, normal S1 and S2 PULM:  CTAB no wheezing or rhonchi GI/GU:  non-distended, non-tender MSK/SPINE:  No gross deformities, no edema SKIN:  no rash, atraumatic   *Additional and/or pertinent findings included in MDM below  Diagnostic and Interventional Summary    EKG Interpretation  Date/Time:    Ventricular Rate:    PR Interval:    QRS Duration:   QT Interval:    QTC Calculation:   R Axis:     Text Interpretation:         Labs Reviewed  URINALYSIS, ROUTINE W REFLEX MICROSCOPIC - Abnormal; Notable for the following components:      Result Value   Color, Urine STRAW (*)    Specific Gravity, Urine 1.001 (*)  Hgb urine dipstick SMALL (*)    All other components within normal limits  GC/CHLAMYDIA PROBE AMP ([place]) NOT AT [facility]    No orders to display    Medications - No data to display   Procedures  /  Critical Care Procedures  ED Course and Medical Decision Making  Initial Impression and Ddx Dysuria, UTI, STD considered.  Abdomen soft and nontender, normal vital signs.  Also considering malingering for secondary gain, namely a place to sleep.  Past medical/surgical history that increases complexity of ED encounter: HIV  Interpretation of Diagnostics I personally reviewed the urinalysis and my interpretation is as follows: No significant signs of infection    Patient Reassessment and Ultimate Disposition/Management     Patient is relieved that his urine sample appears normal.  He is sexually active, he declines STD testing or treatment  at this time, wants to go home.  Patient management required discussion with the following services or consulting groups:  None  Complexity of Problems Addressed Acute complicated illness or Injury  Additional Data Reviewed and Analyzed Further history obtained from: None  Additional Factors Impacting ED Encounter Risk None  Elmer Sow. Pilar Plate, MD [facility] Emergency Medicine [facility] [email or web address]  Final Clinical Impressions(s) / ED Diagnoses     ICD-10-CM   1. Dysuria  R30.0       ED Discharge Orders     None        Discharge Instructions Discussed with and Provided to Patient:    Discharge Instructions      You were evaluated in the Emergency Department and after careful evaluation, we did not find any emergent condition requiring admission or further testing in the hospital.  Your exam/testing today was overall reassuring.  Please return to the Emergency Department if you experience any worsening of your condition.  Thank you for allowing [place] to be a part of your care.       Sabas Sous, MD 06/10/22 [phone]

## 2022-06-18 DIAGNOSIS — E876 Hypokalemia: Secondary | ICD-10-CM | POA: Insufficient documentation

## 2022-06-18 DIAGNOSIS — R0789 Other chest pain: Secondary | ICD-10-CM | POA: Diagnosis present

## 2022-06-18 DIAGNOSIS — I509 Heart failure, unspecified: Secondary | ICD-10-CM | POA: Insufficient documentation

## 2022-06-18 DIAGNOSIS — I110 Hypertensive heart disease with heart failure: Secondary | ICD-10-CM | POA: Diagnosis not present

## 2022-06-18 DIAGNOSIS — Z21 Asymptomatic human immunodeficiency virus [HIV] infection status: Secondary | ICD-10-CM | POA: Diagnosis not present

## 2022-06-19 ENCOUNTER — Emergency Department: Payer: Medicaid Other

## 2022-06-19 ENCOUNTER — Emergency Department
Admission: EM | Admit: 2022-06-19 | Discharge: 2022-06-19 | Disposition: A | Discharge status: Home / Self Care | Payer: Medicaid Other | Attending: Emergency Medicine | Admitting: Emergency Medicine

## 2022-06-19 ENCOUNTER — Encounter: Payer: Self-pay | Admitting: Emergency Medicine

## 2022-06-19 ENCOUNTER — Other Ambulatory Visit: Payer: Self-pay

## 2022-06-19 DIAGNOSIS — R079 Chest pain, unspecified: Secondary | ICD-10-CM

## 2022-06-19 DIAGNOSIS — E876 Hypokalemia: Secondary | ICD-10-CM

## 2022-06-19 LAB — CBC
HCT: 41.5 % (ref 39.0–52.0)
Hemoglobin: 13.9 g/dL (ref 13.0–17.0)
MCH: 30.8 pg (ref 26.0–34.0)
MCHC: 33.5 g/dL (ref 30.0–36.0)
MCV: 92 fL (ref 80.0–100.0)
Platelets: 286 10*3/uL (ref 150–400)
RBC: 4.51 MIL/uL (ref 4.22–5.81)
RDW: 14.3 % (ref 11.5–15.5)
WBC: 4.8 10*3/uL (ref 4.0–10.5)
nRBC: 0 % (ref 0.0–0.2)

## 2022-06-19 LAB — LIPASE, BLOOD: Lipase: 35 U/L (ref 11–51)

## 2022-06-19 LAB — COMPREHENSIVE METABOLIC PANEL
ALT: 32 U/L (ref 0–44)
AST: 34 U/L (ref 15–41)
Albumin: 3.6 g/dL (ref 3.5–5.0)
Alkaline Phosphatase: 55 U/L (ref 38–126)
Anion gap: 9 (ref 5–15)
BUN: 12 mg/dL (ref 6–20)
CO2: 25 mmol/L (ref 22–32)
Calcium: 8.6 mg/dL — ABNORMAL LOW (ref 8.9–10.3)
Chloride: 106 mmol/L (ref 98–111)
Creatinine, Ser: 1.07 mg/dL (ref 0.61–1.24)
GFR, Estimated: >60 mL/min (ref >60)
Glucose, Bld: 98 mg/dL (ref 70–99)
Potassium: 2.9 mmol/L — ABNORMAL LOW (ref 3.5–5.1)
Sodium: 140 mmol/L (ref 135–145)
Total Bilirubin: 0.4 mg/dL (ref 0.3–1.2)
Total Protein: 8 g/dL (ref 6.5–8.1)

## 2022-06-19 LAB — TROPONIN I (HIGH SENSITIVITY)
Troponin I (High Sensitivity): 6 ng/L (ref <18)
Troponin I (High Sensitivity): 7 ng/L (ref <18)

## 2022-06-19 MED ORDER — POTASSIUM CHLORIDE CRYS ER 20 MEQ PO TBCR
40.0000 meq | EXTENDED_RELEASE_TABLET | Freq: Once | ORAL | Status: AC
  Administered 2022-06-19: 40 meq via ORAL
  Filled 2022-06-19: qty 2

## 2022-06-19 MED ORDER — MORPHINE SULFATE (PF) 4 MG/ML IV SOLN: 4.0000 mg | Freq: Once | INTRAVENOUS | Status: DC

## 2022-06-19 MED ORDER — NITROGLYCERIN 2 % TD OINT
1.0000 [in_us] | TOPICAL_OINTMENT | Freq: Once | TRANSDERMAL | Status: AC
  Administered 2022-06-19: 1 [in_us] via TOPICAL
  Filled 2022-06-19: qty 1

## 2022-06-19 MED ORDER — ASPIRIN 81 MG PO CHEW
324.0000 mg | CHEWABLE_TABLET | Freq: Once | ORAL | Status: AC
  Administered 2022-06-19: 324 mg via ORAL
  Filled 2022-06-19: qty 4

## 2022-06-19 NOTE — ED Triage Notes (Signed)
Pt c/o left sided non-radiating chest pain that has continued since 1400 and has increased in pain. Hx/o CHF. Pt denies SOB, denies substance abuse as well as ETOH use.
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
## 2022-06-19 NOTE — Discharge Instructions (Signed)
Return to the ER for worsening symptoms, persistent vomiting, difficulty breathing or other concerns. °

## 2022-06-19 NOTE — ED Notes (Signed)
Patient discharged at this time. Ambulated to lobby with independent and steady gait. Breathing unlabored speaking in full sentences. Verbalized understanding of all discharge, follow up, and medication teaching. Discharged homed with all belongings.   Pt states he is waiting in lobby for ride to arrive.

## 2022-06-19 NOTE — ED Notes (Signed)
Pt refused saline lock IV, was agreeable to medications.

## 2022-06-21 ENCOUNTER — Emergency Department
Admission: EM | Admit: 2022-06-21 | Discharge: 2022-06-21 | Discharge status: Against Medical Advice | Payer: Medicaid Other

## 2022-06-21 NOTE — ED Triage Notes (Signed)
To triage via ambulation with [facility] with c/o sob. HX of asthma 1 Atrovent 15 Albuterol with EMS. Pt reports he feels back to baseline at this time.  NAD noted

## 2022-06-21 NOTE — ED Notes (Signed)
Pt to desk, states he prefers to not be seen at this time as he feels " back to normal after they gave me that breathing tx." Pt encouraged to return to ED if status changes. Pt seen ambulating out of dept, stating he would wait for ride.

## 2022-06-26 ENCOUNTER — Other Ambulatory Visit: Payer: Self-pay

## 2022-06-26 ENCOUNTER — Emergency Department (HOSPITAL_COMMUNITY)
Admission: EM | Admit: 2022-06-26 | Discharge: 2022-06-27 | Disposition: A | Discharge status: Home / Self Care | Payer: Medicaid Other | Attending: Emergency Medicine | Admitting: Emergency Medicine

## 2022-06-26 DIAGNOSIS — Z79899 Other long term (current) drug therapy: Secondary | ICD-10-CM | POA: Diagnosis not present

## 2022-06-26 DIAGNOSIS — I509 Heart failure, unspecified: Secondary | ICD-10-CM | POA: Insufficient documentation

## 2022-06-26 DIAGNOSIS — M79674 Pain in right toe(s): Secondary | ICD-10-CM | POA: Insufficient documentation

## 2022-06-26 DIAGNOSIS — Z21 Asymptomatic human immunodeficiency virus [HIV] infection status: Secondary | ICD-10-CM | POA: Insufficient documentation

## 2022-06-26 DIAGNOSIS — I110 Hypertensive heart disease with heart failure: Secondary | ICD-10-CM | POA: Diagnosis not present

## 2022-06-26 NOTE — ED Triage Notes (Signed)
Pt c/o R leg pain x 2 day., no injury. Pt also reporting to hurt himself by cutting his R leg from pain.

## 2022-06-27 ENCOUNTER — Emergency Department (HOSPITAL_COMMUNITY): Payer: Medicaid Other

## 2022-06-27 LAB — COMPREHENSIVE METABOLIC PANEL
ALT: 38 U/L (ref 0–44)
AST: 35 U/L (ref 15–41)
Albumin: 3.6 g/dL (ref 3.5–5.0)
Alkaline Phosphatase: 55 U/L (ref 38–126)
Anion gap: 13 (ref 5–15)
BUN: 14 mg/dL (ref 6–20)
CO2: 22 mmol/L (ref 22–32)
Calcium: 9.1 mg/dL (ref 8.9–10.3)
Chloride: 106 mmol/L (ref 98–111)
Creatinine, Ser: 1.12 mg/dL (ref 0.61–1.24)
GFR, Estimated: >60 mL/min (ref >60)
Glucose, Bld: 83 mg/dL (ref 70–99)
Potassium: 3.6 mmol/L (ref 3.5–5.1)
Sodium: 141 mmol/L (ref 135–145)
Total Bilirubin: 0.4 mg/dL (ref 0.3–1.2)
Total Protein: 8.6 g/dL — ABNORMAL HIGH (ref 6.5–8.1)

## 2022-06-27 LAB — CBC
HCT: 43.9 % (ref 39.0–52.0)
Hemoglobin: 14.5 g/dL (ref 13.0–17.0)
MCH: 31.1 pg (ref 26.0–34.0)
MCHC: 33 g/dL (ref 30.0–36.0)
MCV: 94.2 fL (ref 80.0–100.0)
Platelets: 267 10*3/uL (ref 150–400)
RBC: 4.66 MIL/uL (ref 4.22–5.81)
RDW: 15 % (ref 11.5–15.5)
WBC: 5.6 10*3/uL (ref 4.0–10.5)
nRBC: 0 % (ref 0.0–0.2)

## 2022-06-27 LAB — ACETAMINOPHEN LEVEL: Acetaminophen (Tylenol), Serum: <10 ug/mL — ABNORMAL LOW (ref 10–30)

## 2022-06-27 LAB — SALICYLATE LEVEL: Salicylate Lvl: <7 mg/dL — ABNORMAL LOW (ref 7.0–30.0)

## 2022-06-27 LAB — RAPID URINE DRUG SCREEN, HOSP PERFORMED
Amphetamines: NOT DETECTED
Barbiturates: NOT DETECTED
Benzodiazepines: NOT DETECTED
Cocaine: NOT DETECTED
Opiates: NOT DETECTED
Tetrahydrocannabinol: POSITIVE — AB

## 2022-06-27 LAB — ETHANOL: Alcohol, Ethyl (B): 86 mg/dL — ABNORMAL HIGH (ref <10)

## 2022-06-27 MED ORDER — TRAMADOL HCL 50 MG PO TABS
50.0000 mg | ORAL_TABLET | Freq: Once | ORAL | Status: AC
  Administered 2022-06-27: 50 mg via ORAL
  Filled 2022-06-27: qty 1

## 2022-06-27 NOTE — ED Provider Notes (Signed)
[facility] EMERGENCY DEPARTMENT Provider Note   CSN: [id number] Arrival date & time: 06/26/22  2146     History  Chief Complaint  Patient presents with   Leg Pain   Suicidal    William Powers is a 41 y.o. male.  With past medical history of HFrEF, HIV, hypertension, polysubstance abuse who presents to the emergency department with toe pain.  Patient states that his right great toe has been hurting him since yesterday morning.  States that he woke up with severe pain in the right great toe that is nonradiating.  He describes it as throbbing and sharp.  Worse with ambulating.  States that he thinks it is due to his shoes.  He has been wearing sandals and states that he walks everywhere.  He denies being homeless but states that he does not have a car so has to walk from place to place.  He denies pain in the foot, heel or ankle.  He has had no swelling or redness or fevers.  There was a note from triage that he was also suicidal but he adamantly denies this.   Leg Pain      Home Medications Prior to Admission medications   Medication Sig Start Date End Date Taking? Authorizing Provider  amoxicillin-clavulanate (AUGMENTIN) 875-125 MG tablet Take 1 tablet by mouth every 12 (twelve) hours. 06/07/22   Petrucelli, Samantha R, PA-C  BIKTARVY 50-200-25 MG TABS tablet Take 1 tablet by mouth daily. Patient not taking: Reported on 03/01/2022 11/18/21   Melene Plan, DO  escitalopram (LEXAPRO) 10 MG tablet Take 1 tablet (10 mg total) by mouth daily. Patient not taking: Reported on 05/07/2022 05/04/22   Ardis Hughs, NP  metoprolol succinate (TOPROL-XL) 50 MG 24 hr tablet Take 1 tablet by mouth 2 times daily. Patient not taking: Reported on 03/01/2022 11/18/21   Melene Plan, DO  Potassium Chloride ER 20 MEQ TBCR Take 20 mEq by mouth daily at 6 (six) AM. Patient not taking: No sig reported 08/07/21 09/05/21  Bobette Mo, MD      Allergies    Bee venom, Lisinopril,  and Sulfamethoxazole-trimethoprim    Review of Systems   Review of Systems  Musculoskeletal:  Positive for arthralgias and gait problem.  All other systems reviewed and are negative.   Physical Exam Updated Vital Signs BP 121/78 (BP Location: Left Arm)   Pulse 91   Temp 98.7 F (37.1 C) (Oral)   Resp 17   SpO2 95%  Physical Exam Vitals and nursing note reviewed.  Constitutional:      General: He is not in acute distress.    Appearance: Normal appearance. He is not ill-appearing or toxic-appearing.  HENT:     Head: Normocephalic and atraumatic.  Eyes:     General: No scleral icterus.    Extraocular Movements: Extraocular movements intact.  Cardiovascular:     Pulses: Normal pulses.  Pulmonary:     Effort: Pulmonary effort is normal. No respiratory distress.  Musculoskeletal:        General: Tenderness present. No swelling. Normal range of motion.     Cervical back: Neck supple.       Feet:  Feet:     Right foot:     Skin integrity: Callus present. No ulcer, blister, skin breakdown, erythema or warmth.  Skin:    General: Skin is warm and dry.     Capillary Refill: Capillary refill takes less than 2 seconds.  Findings: No bruising, erythema or rash.  Neurological:     General: No focal deficit present.     Mental Status: He is alert and oriented to person, place, and time. Mental status is at baseline.  Psychiatric:        Mood and Affect: Mood normal.        Behavior: Behavior normal.        Thought Content: Thought content normal.        Judgment: Judgment normal.     ED Results / Procedures / Treatments   Labs (all labs ordered are listed, but only abnormal results are displayed) Labs Reviewed  COMPREHENSIVE METABOLIC PANEL - Abnormal; Notable for the following components:      Result Value   Total Protein 8.6 (*)    All other components within normal limits  ETHANOL - Abnormal; Notable for the following components:   Alcohol, Ethyl (B) 86 (*)    All  other components within normal limits  SALICYLATE LEVEL - Abnormal; Notable for the following components:   Salicylate Lvl [id number] (*)    All other components within normal limits  ACETAMINOPHEN LEVEL - Abnormal; Notable for the following components:   Acetaminophen (Tylenol), Serum <10 (*)    All other components within normal limits  RAPID URINE DRUG SCREEN, HOSP PERFORMED - Abnormal; Notable for the following components:   Tetrahydrocannabinol POSITIVE (*)    All other components within normal limits  CBC   EKG None  Radiology DG Toe Great Right  Result Date: 06/27/2022 CLINICAL DATA:  Great toe pain. EXAM: RIGHT GREAT TOE COMPARISON:  None Available. FINDINGS: No fracture. No subluxation or dislocation. No worrisome lytic or sclerotic osseous abnormality. IMPRESSION: Negative. Electronically Signed   By: Misty Stanley M.D.   On: 06/27/2022 08:06    Procedures Procedures   Medications Ordered in ED Medications  traMADol (ULTRAM) tablet 50 mg (50 mg Oral Given 06/27/22 0803)    ED Course/ Medical Decision Making/ A&P                           Medical Decision Making Amount and/or Complexity of Data Reviewed Labs: ordered. Radiology: ordered.  Risk Prescription drug management.   This patient presents to the ED with chief complaint(s) of left toe pain with pertinent past medical history of HIV, HFrEF which further complicates the presenting complaint. The complaint involves an extensive differential diagnosis and also carries with it a high risk of complications and morbidity.    The differential diagnosis includes sprain, turf toe, stress fracture, gout, arthritis, septic joint, etc.  Additional history obtained: Additional history obtained from  none available Records reviewed Care Everywhere/External Records and Primary Care Documents  ED Course and Reassessment: 41 year old male who presents to the emergency department with right great toe pain. Physical exam is  unremarkable.  There is a callus but there is no redness, warmth concerning for septic joint.  He has range of motion.  There is no pain to the heel, arch of the foot, ankle.  No swelling.   Obtained a plain film which showed no acute fracture, subluxation, lytic lesion Given 1 dose of tramadol here in the ED.  Unclear etiology of his foot pain.  He is able to ambulate just with minor limp.  This may be turf toe or gout or sprain.  Likely has an element of overuse.  Do not feel that he needs further work-up at this time.  Instructed that he should use ibuprofen and if able, use a more supportive shoe. He verbalizes understanding. Feel that he is safe for discharge with return precautions if unable to ambulate.   Regarding a previous report of SI, he adamantly denies this. Denies HI/AVH as well. He states he said "the pain is killing me," in triage and is unsure if this was misinterpreted but denies actual SI or plan.   Independent labs interpretation:  The following labs were independently interpreted: Initially presented and said he was suicidal.  Medical clearance labs were obtained.  He was somewhat intoxicated on arrival but this was almost 10 hours ago.  He is clinically sober.  No leukocytosis  Independent visualization of imaging: - I independently visualized the following imaging with scope of interpretation limited to determining acute life threatening conditions related to emergency care: X-ray of the right great toe, which revealed no acute fracture, subluxation, lytic lesion  Consultation: - Consulted or discussed management/test interpretation w/ external professional: Not indicated  Consideration for admission or further workup: Not indicated Social Determinants of health: Polysubstance abuse, previously unhoused Final Clinical Impression(s) / ED Diagnoses Final diagnoses:  Pain of right great toe    Rx / DC Orders ED Discharge Orders     None         Cristopher Peru,  PA-C 06/27/22 5701    Tegeler, Canary Brim, MD 06/27/22 1523

## 2022-06-27 NOTE — Discharge Instructions (Signed)
You were seen in the emergency department today for toe pain.  You likely have a sprain of your toe or soreness from the sandals which are not very supportive. Ibuprofen should be helpful for your pain, and if able, obtaining a more supportive pair of shoes for walking. Please return if you are unable to walk.

## 2022-06-27 NOTE — ED Notes (Signed)
Pt in triage  

## 2022-08-05 ENCOUNTER — Emergency Department (HOSPITAL_COMMUNITY): Payer: Medicaid Other

## 2022-08-05 ENCOUNTER — Emergency Department (HOSPITAL_COMMUNITY)
Admission: EM | Admit: 2022-08-05 | Discharge: 2022-08-05 | Discharge status: Against Medical Advice | Payer: Medicaid Other | Attending: Physician Assistant | Admitting: Physician Assistant

## 2022-08-05 ENCOUNTER — Encounter (HOSPITAL_COMMUNITY): Payer: Self-pay

## 2022-08-05 ENCOUNTER — Other Ambulatory Visit: Payer: Self-pay

## 2022-08-05 DIAGNOSIS — I509 Heart failure, unspecified: Secondary | ICD-10-CM | POA: Diagnosis not present

## 2022-08-05 DIAGNOSIS — R0602 Shortness of breath: Secondary | ICD-10-CM | POA: Insufficient documentation

## 2022-08-05 DIAGNOSIS — R110 Nausea: Secondary | ICD-10-CM | POA: Diagnosis not present

## 2022-08-05 DIAGNOSIS — R079 Chest pain, unspecified: Secondary | ICD-10-CM | POA: Insufficient documentation

## 2022-08-05 DIAGNOSIS — Z5321 Procedure and treatment not carried out due to patient leaving prior to being seen by health care provider: Secondary | ICD-10-CM | POA: Insufficient documentation

## 2022-08-05 LAB — COMPREHENSIVE METABOLIC PANEL
ALT: 39 U/L (ref 0–44)
AST: 35 U/L (ref 15–41)
Albumin: 3.7 g/dL (ref 3.5–5.0)
Alkaline Phosphatase: 56 U/L (ref 38–126)
Anion gap: 8 (ref 5–15)
BUN: 12 mg/dL (ref 6–20)
CO2: 21 mmol/L — ABNORMAL LOW (ref 22–32)
Calcium: 8.6 mg/dL — ABNORMAL LOW (ref 8.9–10.3)
Chloride: 106 mmol/L (ref 98–111)
Creatinine, Ser: 0.92 mg/dL (ref 0.61–1.24)
GFR, Estimated: >60 mL/min (ref >60)
Glucose, Bld: 97 mg/dL (ref 70–99)
Potassium: 3.5 mmol/L (ref 3.5–5.1)
Sodium: 135 mmol/L (ref 135–145)
Total Bilirubin: 0.3 mg/dL (ref 0.3–1.2)
Total Protein: 8.3 g/dL — ABNORMAL HIGH (ref 6.5–8.1)

## 2022-08-05 LAB — CBC WITH DIFFERENTIAL/PLATELET
Abs Immature Granulocytes: 0.01 10*3/uL (ref 0.00–0.07)
Basophils Absolute: 0 10*3/uL (ref 0.0–0.1)
Basophils Relative: 1 %
Eosinophils Absolute: 0 10*3/uL (ref 0.0–0.5)
Eosinophils Relative: 1 %
HCT: 41.1 % (ref 39.0–52.0)
Hemoglobin: 13.7 g/dL (ref 13.0–17.0)
Immature Granulocytes: 0 %
Lymphocytes Relative: 25 %
Lymphs Abs: 1 10*3/uL (ref 0.7–4.0)
MCH: 31.3 pg (ref 26.0–34.0)
MCHC: 33.3 g/dL (ref 30.0–36.0)
MCV: 93.8 fL (ref 80.0–100.0)
Monocytes Absolute: 0.4 10*3/uL (ref 0.1–1.0)
Monocytes Relative: 10 %
Neutro Abs: 2.5 10*3/uL (ref 1.7–7.7)
Neutrophils Relative %: 63 %
Platelets: 235 10*3/uL (ref 150–400)
RBC: 4.38 MIL/uL (ref 4.22–5.81)
RDW: 13.4 % (ref 11.5–15.5)
WBC: 4 10*3/uL (ref 4.0–10.5)
nRBC: 0 % (ref 0.0–0.2)

## 2022-08-05 LAB — LIPASE, BLOOD: Lipase: 67 U/L — ABNORMAL HIGH (ref 11–51)

## 2022-08-05 LAB — TROPONIN I (HIGH SENSITIVITY)
Troponin I (High Sensitivity): 5 ng/L (ref <18)
Troponin I (High Sensitivity): 6 ng/L (ref <18)

## 2022-08-05 MED ORDER — ONDANSETRON HCL 4 MG/2ML IJ SOLN
4.0000 mg | Freq: Once | INTRAMUSCULAR | Status: AC
  Administered 2022-08-05: 4 mg via INTRAVENOUS
  Filled 2022-08-05: qty 2

## 2022-08-05 MED ORDER — IOHEXOL 350 MG/ML SOLN
75.0000 mL | Freq: Once | INTRAVENOUS | Status: AC | PRN
  Administered 2022-08-05: 75 mL via INTRAVENOUS

## 2022-08-05 NOTE — ED Provider Triage Note (Signed)
Emergency Medicine Provider Triage Evaluation Note  William Powers , a 41 y.o. male  was evaluated in triage.  Pt complains of chest pain that started an hour ago.  Patient endorses centralized chest pain and pain that radiates to the back which started after he had walked home from work approximately an hour ago.  Per chart review, history of PE.  Patient reports a history of heart failure with has not noticed any leg swelling.  Also endorses shortness of breath.  He received nitro with EMS with no relief. Denies recent drug use. He endorses nausea    Review of Systems  Positive: As above Negative: As above  Physical Exam  There were no vitals taken for this visit. Gen:   Awake, no distress   Resp:  Normal effort  MSK:   Moves extremities without difficulty  Other:  Uncomfortable appearing, lung sounds clear, tenderness under left breast with no overlying skin changes   Medical Decision Making  Medically screening exam initiated at 12:26 AM.  Appropriate orders placed.  William Powers was informed that the remainder of the evaluation will be completed by another provider, this initial triage assessment does not replace that evaluation, and the importance of remaining in the ED until their evaluation is complete.  PE study ordered given tachycardia and prior history    Garald Balding, PA-C 08/05/22 2725

## 2022-08-05 NOTE — ED Triage Notes (Signed)
PER EMS: pt reports CP when he was walking to a friends house tonight associated with SOB. EMS gave 1 SL nitro tab en route. Pt refused IV start with EMS.   BP- 123/78, HR-135  Hx CHF and HIV.

## 2022-08-05 NOTE — ED Notes (Signed)
Patient states his wife is coming to pick him up. States he wants his name removed from the list

## 2022-08-21 ENCOUNTER — Emergency Department (HOSPITAL_COMMUNITY): Payer: Medicaid Other

## 2022-08-21 ENCOUNTER — Other Ambulatory Visit: Payer: Self-pay

## 2022-08-21 ENCOUNTER — Emergency Department (HOSPITAL_COMMUNITY)
Admission: EM | Admit: 2022-08-21 | Discharge: 2022-08-21 | Disposition: A | Discharge status: Home / Self Care | Payer: Medicaid Other | Attending: Emergency Medicine | Admitting: Emergency Medicine

## 2022-08-21 DIAGNOSIS — I509 Heart failure, unspecified: Secondary | ICD-10-CM | POA: Insufficient documentation

## 2022-08-21 DIAGNOSIS — F1020 Alcohol dependence, uncomplicated: Secondary | ICD-10-CM | POA: Insufficient documentation

## 2022-08-21 DIAGNOSIS — J069 Acute upper respiratory infection, unspecified: Secondary | ICD-10-CM | POA: Diagnosis not present

## 2022-08-21 DIAGNOSIS — I110 Hypertensive heart disease with heart failure: Secondary | ICD-10-CM | POA: Insufficient documentation

## 2022-08-21 DIAGNOSIS — Z79899 Other long term (current) drug therapy: Secondary | ICD-10-CM | POA: Insufficient documentation

## 2022-08-21 DIAGNOSIS — Z21 Asymptomatic human immunodeficiency virus [HIV] infection status: Secondary | ICD-10-CM | POA: Diagnosis not present

## 2022-08-21 DIAGNOSIS — R0602 Shortness of breath: Secondary | ICD-10-CM | POA: Diagnosis present

## 2022-08-21 DIAGNOSIS — Z20822 Contact with and (suspected) exposure to covid-19: Secondary | ICD-10-CM | POA: Insufficient documentation

## 2022-08-21 DIAGNOSIS — F1090 Alcohol use, unspecified, uncomplicated: Secondary | ICD-10-CM

## 2022-08-21 LAB — I-STAT CHEM 8, ED
BUN: 9 mg/dL (ref 6–20)
Calcium, Ion: 1.16 mmol/L (ref 1.15–1.40)
Chloride: 101 mmol/L (ref 98–111)
Creatinine, Ser: 1.1 mg/dL (ref 0.61–1.24)
Glucose, Bld: 89 mg/dL (ref 70–99)
HCT: 40 % (ref 39.0–52.0)
Hemoglobin: 13.6 g/dL (ref 13.0–17.0)
Potassium: 3.8 mmol/L (ref 3.5–5.1)
Sodium: 137 mmol/L (ref 135–145)
TCO2: 25 mmol/L (ref 22–32)

## 2022-08-21 LAB — SARS CORONAVIRUS 2 BY RT PCR: SARS Coronavirus 2 by RT PCR: NEGATIVE

## 2022-08-21 LAB — CBC WITH DIFFERENTIAL/PLATELET
Abs Immature Granulocytes: 0.02 10*3/uL (ref 0.00–0.07)
Basophils Absolute: 0 10*3/uL (ref 0.0–0.1)
Basophils Relative: 0 %
Eosinophils Absolute: 0 10*3/uL (ref 0.0–0.5)
Eosinophils Relative: 0 %
HCT: 39.1 % (ref 39.0–52.0)
Hemoglobin: 12.9 g/dL — ABNORMAL LOW (ref 13.0–17.0)
Immature Granulocytes: 0 %
Lymphocytes Relative: 10 %
Lymphs Abs: 0.8 10*3/uL (ref 0.7–4.0)
MCH: 31.2 pg (ref 26.0–34.0)
MCHC: 33 g/dL (ref 30.0–36.0)
MCV: 94.7 fL (ref 80.0–100.0)
Monocytes Absolute: 0.8 10*3/uL (ref 0.1–1.0)
Monocytes Relative: 10 %
Neutro Abs: 6.3 10*3/uL (ref 1.7–7.7)
Neutrophils Relative %: 80 %
Platelets: 256 10*3/uL (ref 150–400)
RBC: 4.13 MIL/uL — ABNORMAL LOW (ref 4.22–5.81)
RDW: 13.7 % (ref 11.5–15.5)
WBC: 8 10*3/uL (ref 4.0–10.5)
nRBC: 0 % (ref 0.0–0.2)

## 2022-08-21 LAB — RAPID URINE DRUG SCREEN, HOSP PERFORMED
Amphetamines: NOT DETECTED
Barbiturates: NOT DETECTED
Benzodiazepines: NOT DETECTED
Cocaine: POSITIVE — AB
Opiates: NOT DETECTED
Tetrahydrocannabinol: NOT DETECTED

## 2022-08-21 LAB — TROPONIN I (HIGH SENSITIVITY)
Troponin I (High Sensitivity): 9 ng/L (ref <18)
Troponin I (High Sensitivity): 9 ng/L (ref <18)

## 2022-08-21 LAB — RESP PANEL BY RT-PCR (FLU A&B, COVID) ARPGX2
Influenza A by PCR: NEGATIVE
Influenza B by PCR: NEGATIVE
SARS Coronavirus 2 by RT PCR: NEGATIVE

## 2022-08-21 LAB — ETHANOL: Alcohol, Ethyl (B): <10 mg/dL (ref <10)

## 2022-08-21 MED ORDER — ONDANSETRON HCL 4 MG PO TABS: 4.0000 mg | ORAL_TABLET | Freq: Three times a day (TID) | ORAL | Status: DC | PRN

## 2022-08-21 MED ORDER — IOHEXOL 350 MG/ML SOLN
75.0000 mL | Freq: Once | INTRAVENOUS | Status: AC | PRN
  Administered 2022-08-21: 75 mL via INTRAVENOUS

## 2022-08-21 NOTE — ED Triage Notes (Signed)
Pt arrived via [facility] for shortness of breath from [facility] when walking from work. Pt reports symptoms started after walking about an hour and a half with feelings of congestion. PMH CHF. Endorses mild nausea. Denies vomiting, diarrhea, chest pain, light headed or dizziness.   HR 116 BP 154/90 SPO2 98% RA RR 16

## 2022-08-21 NOTE — ED Notes (Signed)
Patient transported to CT 

## 2022-08-21 NOTE — ED Notes (Signed)
Pt taking bus to [facility] Discharge paperwork given to pt with opportunities to ask questions.

## 2022-08-21 NOTE — ED Notes (Signed)
Upon EDP being at bedside to explain negative CT results and plan to d/c patient, patient now is stating SI with a plan. TTS consult placed and [facility] process now being followed.

## 2022-08-21 NOTE — ED Notes (Signed)
Pt reported to RN that he is feeling hopeless post "losing" someone important to him over "stupid shit". Pt denies having any plans, timeline, or methods. Pt does not appear to be in any apparent distress.

## 2022-08-21 NOTE — ED Provider Notes (Signed)
I received signout on patient from Dr. Nicholes Stairs pending TTS consultation.  Patient earlier had work-up for shortness of breath which was overall reassuring.  Previous provider also discussed results with the patient including incidental thoracic lymphadenopathy. patient reports he is no longer suicidal, also denies any homicidal ideation, hallucinations.  He does want psychiatric help but prefers to go to [facility].  Will discharge patient.   Cristie Hem, MD 08/21/22 [phone]

## 2022-08-21 NOTE — ED Triage Notes (Incomplete Revision)
Pt arrived via [facility] for shortness of breath from [facility] when walking from work. Pt reports symptoms started after walking about an hour and a half with feelings of congestion. Pt states hes been feeling weak for the last couple of days. PMH CHF. Endorses mild nausea. Denies vomiting, diarrhea, chest pain, light headed or dizziness.   HR 116 BP 154/90 SPO2 98% RA RR 16

## 2022-08-21 NOTE — ED Notes (Signed)
Patient refusing to give up cell phone and other personal belongings. This RN, [place], and charge RN explained [facility] policy to patient.

## 2022-08-21 NOTE — ED Provider Notes (Signed)
[facility] EMERGENCY DEPARTMENT Provider Note   CSN: [id number] Arrival date & time: 08/21/22  0007     History  Chief Complaint  Patient presents with   Shortness of Breath    William Powers is a 41 y.o. male.  The history is provided by the patient.  Shortness of Breath Severity:  Moderate Onset quality:  Gradual Duration: days of congestion, developed SOB while walking out of work [employer]. Timing:  Constant Progression:  Worsening Chronicity:  New Context: URI   Associated symptoms: no abdominal pain, no chest pain and no fever   Patient with a past medical histroy of CHF and polysubstance use presents with nasal congestion and SOB.  Nasal congestion for several days and SOB walking out of work into the cold.      Past Medical History:  Diagnosis Date   Heart failure (HCC)    HFrEF (heart failure with reduced ejection fraction) (HCC)    15% EF as of 2019   HIV (human immunodeficiency virus infection) (HCC)    Hypertension    Polysubstance abuse (HCC)    Pulmonary embolism (HCC)     Home Medications Prior to Admission medications   Medication Sig Start Date End Date Taking? Authorizing Provider  amoxicillin-clavulanate (AUGMENTIN) 875-125 MG tablet Take 1 tablet by mouth every 12 (twelve) hours. 06/07/22   Petrucelli, Samantha R, PA-C  BIKTARVY 50-200-25 MG TABS tablet Take 1 tablet by mouth daily. Patient not taking: Reported on 03/01/2022 11/18/21   Melene Plan, DO  escitalopram (LEXAPRO) 10 MG tablet Take 1 tablet (10 mg total) by mouth daily. Patient not taking: Reported on 05/07/2022 05/04/22   Ardis Hughs, NP  metoprolol succinate (TOPROL-XL) 50 MG 24 hr tablet Take 1 tablet by mouth 2 times daily. Patient not taking: Reported on 03/01/2022 11/18/21   Melene Plan, DO  Potassium Chloride ER 20 MEQ TBCR Take 20 mEq by mouth daily at 6 (six) AM. Patient not taking: No sig reported 08/07/21 09/05/21  Bobette Mo, MD       Allergies    Bee venom, Lisinopril, and Sulfamethoxazole-trimethoprim    Review of Systems   Review of Systems  Constitutional:  Negative for fever.  HENT:  Negative for facial swelling.   Eyes:  Negative for redness.  Respiratory:  Positive for shortness of breath.   Cardiovascular:  Negative for chest pain and leg swelling.  Gastrointestinal:  Negative for abdominal pain.  All other systems reviewed and are negative.   Physical Exam Updated Vital Signs BP (!) 136/94   Pulse (!) 101   Temp 98.8 F (37.1 C) (Oral)   Resp (!) 25   Ht 6\' 4"  (1.93 m)   Wt 113 kg   SpO2 98%   BMI 30.32 kg/m  Physical Exam Vitals and nursing note reviewed.  Constitutional:      General: He is not in acute distress.    Appearance: He is well-developed. He is not diaphoretic.  HENT:     Head: Normocephalic and atraumatic.     Nose: Congestion present.  Eyes:     Conjunctiva/sclera: Conjunctivae normal.     Pupils: Pupils are equal, round, and reactive to light.  Cardiovascular:     Rate and Rhythm: Normal rate and regular rhythm.     Pulses: Normal pulses.     Heart sounds: Normal heart sounds.  Pulmonary:     Effort: Pulmonary effort is normal.     Breath sounds: Normal breath sounds.  No wheezing or rales.  Abdominal:     General: Bowel sounds are normal.     Palpations: Abdomen is soft.     Tenderness: There is no abdominal tenderness. There is no guarding or rebound.  Musculoskeletal:        General: Normal range of motion.     Cervical back: Normal range of motion and neck supple.  Skin:    General: Skin is warm and dry.     Capillary Refill: Capillary refill takes less than 2 seconds.  Neurological:     General: No focal deficit present.     Mental Status: He is alert and oriented to person, place, and time.  Psychiatric:        Thought Content: Thought content normal.     ED Results / Procedures / Treatments   Labs (all labs ordered are listed, but only abnormal  results are displayed) Results for orders placed or performed during the hospital encounter of 08/21/22  SARS Coronavirus 2 by RT PCR (hospital order, performed in [facility] hospital lab) *cepheid single result test* Anterior Nasal Swab   Specimen: Anterior Nasal Swab  Result Value Ref Range   SARS Coronavirus 2 by RT PCR NEGATIVE NEGATIVE  CBC with Differential  Result Value Ref Range   WBC 8.0 4.0 - 10.5 K/uL   RBC 4.13 (L) 4.22 - 5.81 MIL/uL   Hemoglobin 12.9 (L) 13.0 - 17.0 g/dL   HCT 69.4 50.3 - 88.8 %   MCV 94.7 80.0 - 100.0 fL   MCH 31.2 26.0 - 34.0 pg   MCHC 33.0 30.0 - 36.0 g/dL   RDW 28.0 03.4 - 91.7 %   Platelets 256 150 - 400 K/uL   nRBC 0.0 0.0 - 0.2 %   Neutrophils Relative % 80 %   Neutro Abs 6.3 1.7 - 7.7 K/uL   Lymphocytes Relative 10 %   Lymphs Abs 0.8 0.7 - 4.0 K/uL   Monocytes Relative 10 %   Monocytes Absolute 0.8 0.1 - 1.0 K/uL   Eosinophils Relative 0 %   Eosinophils Absolute 0.0 0.0 - 0.5 K/uL   Basophils Relative 0 %   Basophils Absolute 0.0 0.0 - 0.1 K/uL   Immature Granulocytes 0 %   Abs Immature Granulocytes 0.02 0.00 - 0.07 K/uL  I-stat chem 8, ED (not at [facility] or [facility])  Result Value Ref Range   Sodium 137 135 - 145 mmol/L   Potassium 3.8 3.5 - 5.1 mmol/L   Chloride 101 98 - 111 mmol/L   BUN 9 6 - 20 mg/dL   Creatinine, Ser 9.15 0.61 - 1.24 mg/dL   Glucose, Bld 89 70 - 99 mg/dL   Calcium, Ion 0.56 9.79 - 1.40 mmol/L   TCO2 25 22 - 32 mmol/L   Hemoglobin 13.6 13.0 - 17.0 g/dL   HCT 48.0 16.5 - 53.7 %  Troponin I (High Sensitivity)  Result Value Ref Range   Troponin I (High Sensitivity) 9 <18 ng/L  Troponin I (High Sensitivity)  Result Value Ref Range   Troponin I (High Sensitivity) 9 <18 ng/L   CT Angio Chest PE W and/or Wo Contrast  Result Date: 08/21/2022 CLINICAL DATA:  Chest pain or SOB, pleurisy or effusion suspected EXAM: CT ANGIOGRAPHY CHEST WITH CONTRAST TECHNIQUE: Multidetector CT imaging of the chest was performed using the  standard protocol during bolus administration of intravenous contrast. Multiplanar CT image reconstructions and MIPs were obtained to evaluate the vascular anatomy. RADIATION DOSE REDUCTION: This exam was  performed according to the departmental dose-optimization program which includes automated exposure control, adjustment of the mA and/or kV according to patient size and/or use of iterative reconstruction technique. CONTRAST:  64mL OMNIPAQUE IOHEXOL 350 MG/ML SOLN COMPARISON:  08/05/2022 FINDINGS: Cardiovascular: No filling defects in the pulmonary arteries to suggest pulmonary emboli. Heart is normal size. Aorta is normal caliber. Mediastinum/Nodes: No mediastinal or hilar adenopathy. Mildly enlarged bilateral axillary lymph nodes again noted, unchanged. Most of the lymph nodes maintain normal central fatty hila suggesting a reactive process. Trachea and esophagus are unremarkable. Thyroid unremarkable. Lungs/Pleura: No confluent opacities or effusions. Upper Abdomen: No acute findings Musculoskeletal: Bilateral gynecomastia, left greater than right. No acute bony abnormality. Review of the MIP images confirms the above findings. IMPRESSION: No evidence of pulmonary embolus. Mildly enlarged bilateral axillary lymph nodes, likely reactive. These could be followed clinically or with repeat CT. Electronically Signed   By: Charlett Nose M.D.   On: 08/21/2022 03:15   CT Angio Chest PE W and/or Wo Contrast  Result Date: 08/05/2022 CLINICAL DATA:  Chest pain; PE suspected EXAM: CT ANGIOGRAPHY CHEST WITH CONTRAST TECHNIQUE: Multidetector CT imaging of the chest was performed using the standard protocol during bolus administration of intravenous contrast. Multiplanar CT image reconstructions and MIPs were obtained to evaluate the vascular anatomy. RADIATION DOSE REDUCTION: This exam was performed according to the departmental dose-optimization program which includes automated exposure control, adjustment of the mA  and/or kV according to patient size and/or use of iterative reconstruction technique. CONTRAST:  54mL OMNIPAQUE IOHEXOL 350 MG/ML SOLN COMPARISON:  Radiographs earlier today and CT chest angiogram 07/23/2021 FINDINGS: Cardiovascular: Satisfactory opacification of the pulmonary arteries to the segmental level. No evidence of pulmonary embolism. Normal heart size. No pericardial effusion. Mediastinum/Nodes: Increased bilateral axillary adenopathy measuring up to 1.5 cm in short axis in the left axilla. Increased size of a few left supraclavicular lymph nodes. Thyroid gland, trachea, and esophagus demonstrate no significant findings. Lungs/Pleura: Scarring/atelectasis in the lower lobes. No focal consolidation, pleural effusion or pneumothorax. Upper Abdomen: No acute abnormality. Musculoskeletal: No acute osseous abnormality.  Gynecomastia. Review of the MIP images confirms the above findings. IMPRESSION: 1. Negative for acute pulmonary embolism. 2. Bilateral axillary adenopathy, new since 07/23/2021. This may be reactive though close interval follow-up is recommended. Electronically Signed   By: Minerva Fester M.D.   On: 08/05/2022 02:37   DG Chest 2 View  Result Date: 08/05/2022 CLINICAL DATA:  Chest pain, shortness of breath EXAM: CHEST - 2 VIEW COMPARISON:  06/19/2022 FINDINGS: The heart size and mediastinal contours are within normal limits. Both lungs are clear. The visualized skeletal structures are unremarkable. IMPRESSION: Normal study. Electronically Signed   By: Charlett Nose M.D.   On: 08/05/2022 01:15    EKG EKG Interpretation  Date/Time:  Thursday August 21 2022 00:30:33 EDT Ventricular Rate:  101 PR Interval:  168 QRS Duration: 101 QT Interval:  343 QTC Calculation: 445 R Axis:   43 Text Interpretation: Sinus tachycardia Ventricular premature complex Baseline wander in lead(s) V2 V4 V5 Confirmed by Nicanor Alcon, Thursa Emme ([id number]) on 08/21/2022 12:37:35 AM  Radiology CT Angio Chest PE W  and/or Wo Contrast  Result Date: 08/21/2022 CLINICAL DATA:  Chest pain or SOB, pleurisy or effusion suspected EXAM: CT ANGIOGRAPHY CHEST WITH CONTRAST TECHNIQUE: Multidetector CT imaging of the chest was performed using the standard protocol during bolus administration of intravenous contrast. Multiplanar CT image reconstructions and MIPs were obtained to evaluate the vascular anatomy. RADIATION DOSE REDUCTION: This exam  was performed according to the departmental dose-optimization program which includes automated exposure control, adjustment of the mA and/or kV according to patient size and/or use of iterative reconstruction technique. CONTRAST:  52mL OMNIPAQUE IOHEXOL 350 MG/ML SOLN COMPARISON:  08/05/2022 FINDINGS: Cardiovascular: No filling defects in the pulmonary arteries to suggest pulmonary emboli. Heart is normal size. Aorta is normal caliber. Mediastinum/Nodes: No mediastinal or hilar adenopathy. Mildly enlarged bilateral axillary lymph nodes again noted, unchanged. Most of the lymph nodes maintain normal central fatty hila suggesting a reactive process. Trachea and esophagus are unremarkable. Thyroid unremarkable. Lungs/Pleura: No confluent opacities or effusions. Upper Abdomen: No acute findings Musculoskeletal: Bilateral gynecomastia, left greater than right. No acute bony abnormality. Review of the MIP images confirms the above findings. IMPRESSION: No evidence of pulmonary embolus. Mildly enlarged bilateral axillary lymph nodes, likely reactive. These could be followed clinically or with repeat CT. Electronically Signed   By: Rolm Baptise M.D.   On: 08/21/2022 03:15    Procedures Procedures    Medications Ordered in ED Medications  iohexol (OMNIPAQUE) 350 MG/ML injection 75 mL (75 mLs Intravenous Contrast Given 08/21/22 0310)    ED Course/ Medical Decision Making/ A&P                           Medical Decision Making SOB with nasal congestion, SOB started tonight URI symptoms for a few  days   Amount and/or Complexity of Data Reviewed Independent Historian: EMS External Data Reviewed: labs and notes.    Details: Previous notes and UDS reviewed  Labs: ordered.    Details: All labs reviewed: 2 negative troponins 9/9.  Normal white count 8, hemoglobin slightly low 12.9, platelets 256.  Normal sodium 137, normal potassium 3.8, creatinine normal 1.1  Radiology: ordered and independent interpretation performed.    Details: Negative CTA by me  ECG/medicine tests: ordered and independent interpretation performed. Decision-making details documented in ED Course.  Risk Prescription drug management. Risk Details: At discharge patient now stating he is depressed, he is now denying homelessness.  He says this and then immediately pulls the covers up and goes back to sleep.  I have consulted TTS but believe this patient is being manipulative as he did not originally say any of this and also originally stated he was homeless.      Final Clinical Impression(s) / ED Diagnoses Final diagnoses:  Upper respiratory tract infection, unspecified type   Rx / DC Orders ED Discharge Orders     None      The patient has been placed in psychiatric observation due to the need to provide a safe environment for the patient while obtaining psychiatric consultation and evaluation, as well as ongoing medical and medication management to treat the patient's condition.  The patient has not been placed under full IVC at this time.    Brightyn Mozer, MD 08/21/22 [phone]

## 2022-09-12 ENCOUNTER — Other Ambulatory Visit: Payer: Self-pay

## 2022-09-12 ENCOUNTER — Encounter (HOSPITAL_COMMUNITY): Payer: Self-pay

## 2022-09-12 ENCOUNTER — Emergency Department (HOSPITAL_COMMUNITY)
Admission: EM | Admit: 2022-09-12 | Discharge: 2022-09-13 | Disposition: A | Discharge status: Home / Self Care | Payer: Medicaid Other | Attending: Emergency Medicine | Admitting: Emergency Medicine

## 2022-09-12 DIAGNOSIS — D72819 Decreased white blood cell count, unspecified: Secondary | ICD-10-CM | POA: Insufficient documentation

## 2022-09-12 DIAGNOSIS — I10 Essential (primary) hypertension: Secondary | ICD-10-CM | POA: Diagnosis not present

## 2022-09-12 DIAGNOSIS — K047 Periapical abscess without sinus: Secondary | ICD-10-CM

## 2022-09-12 DIAGNOSIS — Z21 Asymptomatic human immunodeficiency virus [HIV] infection status: Secondary | ICD-10-CM | POA: Insufficient documentation

## 2022-09-12 DIAGNOSIS — B20 Human immunodeficiency virus [HIV] disease: Secondary | ICD-10-CM | POA: Diagnosis present

## 2022-09-12 DIAGNOSIS — K029 Dental caries, unspecified: Secondary | ICD-10-CM | POA: Diagnosis not present

## 2022-09-12 DIAGNOSIS — F1910 Other psychoactive substance abuse, uncomplicated: Secondary | ICD-10-CM | POA: Diagnosis not present

## 2022-09-12 DIAGNOSIS — K0889 Other specified disorders of teeth and supporting structures: Secondary | ICD-10-CM | POA: Diagnosis present

## 2022-09-12 NOTE — ED Triage Notes (Signed)
Arrives EMS from [place].   Says he was seen at [facility] yesterday and was going to be admitted but got angry and signed out AMA.

## 2022-09-12 NOTE — ED Provider Triage Note (Signed)
Emergency Medicine Provider Triage Evaluation Note  William Powers , a 41 y.o. male  was evaluated in triage.  Pt complains of right sided facial edema. Patient seen in ED yesterday where he notes he was going to get admitted; however left AMA?  CT scan demonstrated "Peripherally enhancing collection along the right maxilla with adjacent soft tissue swelling concerning for abscess. There is an adjacent periapical lucency of the right maxillary second molar with defect of the overlying cortex. Stranding in the right facial soft tissues extending inferiorly over the right mandible into the anterior neck suggestive of cellulitis."  Admits to fever  Review of Systems  Positive: Facial edema, fever Negative: SOB  Physical Exam  BP 117/84   Pulse (!) 105   Temp 98.6 F (37 C) (Oral)   Resp 18   Ht 6\' 4"  (1.93 m)   Wt 113.4 kg   SpO2 100%   BMI 30.43 kg/m  Gen:   Awake, no distress   Resp:  Normal effort  MSK:   Moves extremities without difficulty  Other:  Tolerating oral secretions  Medical Decision Making  Medically screening exam initiated at 11:28 PM.  Appropriate orders placed.  Radell was informed that the remainder of the evaluation will be completed by another provider, this initial triage assessment does not replace that evaluation, and the importance of remaining in the ED until their evaluation is complete.  Labs CT scan performed 11/22   12/22 09/12/22 2332

## 2022-09-13 ENCOUNTER — Encounter (HOSPITAL_COMMUNITY): Payer: Self-pay

## 2022-09-13 ENCOUNTER — Emergency Department (HOSPITAL_COMMUNITY): Payer: Medicaid Other

## 2022-09-13 DIAGNOSIS — F1910 Other psychoactive substance abuse, uncomplicated: Secondary | ICD-10-CM

## 2022-09-13 DIAGNOSIS — Z21 Asymptomatic human immunodeficiency virus [HIV] infection status: Secondary | ICD-10-CM

## 2022-09-13 DIAGNOSIS — K029 Dental caries, unspecified: Secondary | ICD-10-CM

## 2022-09-13 LAB — BASIC METABOLIC PANEL
Anion gap: 11 (ref 5–15)
BUN: 7 mg/dL (ref 6–20)
CO2: 21 mmol/L — ABNORMAL LOW (ref 22–32)
Calcium: 8.6 mg/dL — ABNORMAL LOW (ref 8.9–10.3)
Chloride: 103 mmol/L (ref 98–111)
Creatinine, Ser: 0.9 mg/dL (ref 0.61–1.24)
GFR, Estimated: >60 mL/min (ref >60)
Glucose, Bld: 92 mg/dL (ref 70–99)
Potassium: 3.2 mmol/L — ABNORMAL LOW (ref 3.5–5.1)
Sodium: 135 mmol/L (ref 135–145)

## 2022-09-13 LAB — LACTIC ACID, PLASMA
Lactic Acid, Venous: 1.6 mmol/L (ref 0.5–1.9)
Lactic Acid, Venous: 2.2 mmol/L (ref 0.5–1.9)
Lactic Acid, Venous: 3.1 mmol/L (ref 0.5–1.9)

## 2022-09-13 LAB — CBC WITH DIFFERENTIAL/PLATELET
Abs Immature Granulocytes: 0.01 10*3/uL (ref 0.00–0.07)
Basophils Absolute: 0 10*3/uL (ref 0.0–0.1)
Basophils Relative: 1 %
Eosinophils Absolute: 0.1 10*3/uL (ref 0.0–0.5)
Eosinophils Relative: 2 %
HCT: 40.1 % (ref 39.0–52.0)
Hemoglobin: 13.4 g/dL (ref 13.0–17.0)
Immature Granulocytes: 0 %
Lymphocytes Relative: 37 %
Lymphs Abs: 1.4 10*3/uL (ref 0.7–4.0)
MCH: 31.5 pg (ref 26.0–34.0)
MCHC: 33.4 g/dL (ref 30.0–36.0)
MCV: 94.4 fL (ref 80.0–100.0)
Monocytes Absolute: 0.4 10*3/uL (ref 0.1–1.0)
Monocytes Relative: 10 %
Neutro Abs: 1.9 10*3/uL (ref 1.7–7.7)
Neutrophils Relative %: 50 %
Platelet Morphology: NORMAL
Platelets: 295 10*3/uL (ref 150–400)
RBC: 4.25 MIL/uL (ref 4.22–5.81)
RDW: 14.2 % (ref 11.5–15.5)
WBC: 3.8 10*3/uL — ABNORMAL LOW (ref 4.0–10.5)
nRBC: 0 % (ref 0.0–0.2)

## 2022-09-13 LAB — PROTIME-INR
INR: 1 (ref 0.8–1.2)
Prothrombin Time: 13.5 seconds (ref 11.4–15.2)

## 2022-09-13 MED ORDER — AMOXICILLIN-POT CLAVULANATE 875-125 MG PO TABS: 1.0000 | ORAL_TABLET | Freq: Two times a day (BID) | ORAL | 0 refills | Status: DC

## 2022-09-13 MED ORDER — LACTATED RINGERS IV BOLUS
1000.0000 mL | Freq: Once | INTRAVENOUS | Status: AC
  Administered 2022-09-13: 1000 mL via INTRAVENOUS

## 2022-09-13 MED ORDER — IOHEXOL 300 MG/ML  SOLN
75.0000 mL | Freq: Once | INTRAMUSCULAR | Status: AC | PRN
  Administered 2022-09-13: 75 mL via INTRAVENOUS

## 2022-09-13 MED ORDER — SODIUM CHLORIDE 0.9 % IV SOLN
3.0000 g | Freq: Once | INTRAVENOUS | Status: AC
  Administered 2022-09-13: 3 g via INTRAVENOUS
  Filled 2022-09-13: qty 8

## 2022-09-13 MED ORDER — KETOROLAC TROMETHAMINE 15 MG/ML IJ SOLN
15.0000 mg | Freq: Once | INTRAMUSCULAR | Status: AC
  Administered 2022-09-13: 15 mg via INTRAVENOUS
  Filled 2022-09-13: qty 1

## 2022-09-13 NOTE — Discharge Instructions (Addendum)
Follow up with a dentist in the office.

## 2022-09-13 NOTE — ED Provider Notes (Signed)
Received patient in turnover from Dr. Bebe Shaggy.  Please see their note for further details of Hx, PE.  Briefly patient is a 41 y.o. male with a Dental Pain .  Patient unfortunately has been complaining that he is unable to afford his medications.  He was seen yesterday at [facility] and then left AGAINST MEDICAL ADVICE.  There was an attempt to admit the patient's last night but the hospitalist did not feel that he would benefit from admission.  Plan to assess for lactate clearance and have social work eval to try and help him afford his medications.  I was notified by social work that the patient qualifies for medication assistance program that makes all of his medications less than $4.  Unfortunately they are unable to give them to him.  D/c home, dentistry follow up.    Melene Plan, DO 09/13/22 [phone]

## 2022-09-13 NOTE — Sepsis Progress Note (Signed)
Notified bedside nurse of need to draw repeat lactic acid. Will continue to monitor code sepsis.  ?
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
## 2022-09-13 NOTE — Progress Notes (Signed)
TOC consulted for medication assistance. Per Ursula Alert, RN CM, the pt is ineligible for [facility] at this time due to having Medicaid.

## 2022-09-13 NOTE — ED Notes (Signed)
Patient is upset that he is not being admitted.  He has requested to talk with the MD.  MD is at bedside.

## 2022-09-13 NOTE — Sepsis Progress Note (Signed)
Elink following code sepsis °

## 2022-09-13 NOTE — Assessment & Plan Note (Signed)
Pt does not take any HIV meds.

## 2022-09-13 NOTE — Subjective & Objective (Addendum)
XB:JYNW he was seen at [facility] yesterday and was going to be admitted but got angry and signed out AMA   HPI: 41 year old African-American male history of untreated HIV disease, recurrent polysubstance use, chronic mental health disorder presents to the ER today for evaluation of pain and swelling in his right upper jaw.  He was seen at [facility] in [place] 2 to 3 days ago.  He refused hospital admission and left AMA.  He has a history of systolic heart failure with recovered EF now at 55%.  Patient states that he takes no HIV medications because he does not believe in it.  When he was evaluated in the ER in [place], he had a CT soft tissue neck which showed soft tissue enhancement of the right maxilla.  There was multiple dental caries.  In reviewing the patient's prior imaging, the patient has had dental caries for several months since April 2023.  He has not had any imaging of his teeth other than his CT scan in April 2023.  Patient states he does not see a dentist on a regular basis.  He does state that he does have access to [facility] and does get prescriptions filled at [facility].  Patient very irritable when I walked in the room and had to turn on the lights.  He immediately asked for something to eat and drink.

## 2022-09-13 NOTE — ED Notes (Signed)
Patient given a sandwich and drink.  Awaiting a response from the admitting providers.

## 2022-09-13 NOTE — Assessment & Plan Note (Signed)
Pt without trimus. Pt had orbital cellulitis in 01/2022 with a leukocytosis of 11.3. he is able to mount an immune response to infection. No discrete abscesses on CT. Fluid collection seen on CT when he was at [facility] in [place] 2 days ago is not visible today. Pt asking for something to eat and drink. He is able to take oral medications. I see no reason why he cannot take oral augmentin or clindamycin for his dental infection. Pt affirms he has [place] Medicaid and has access to medications via [facility]. He does not require hospital admission at this time. He has been started on Unasyn. He can be transitioned to po augment 875 mg bid. Advised him he needs to see a dentist to have his cavities evaluated and possibly have some of these rotten teeth removed.  Discussed with EDP.
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
## 2022-09-13 NOTE — Assessment & Plan Note (Signed)
Recurrent abuse of cocaine

## 2022-09-18 LAB — CULTURE, BLOOD (ROUTINE X 2): Culture: NO GROWTH

## 2022-10-05 IMAGING — CR DG CHEST 2V
2 series · 2 of 2 positions shown · non-contrast
Comparison: Six days ago

CLINICAL DATA: Shortness of breath

EXAM:
CHEST - 2 VIEW

[w chest pa]
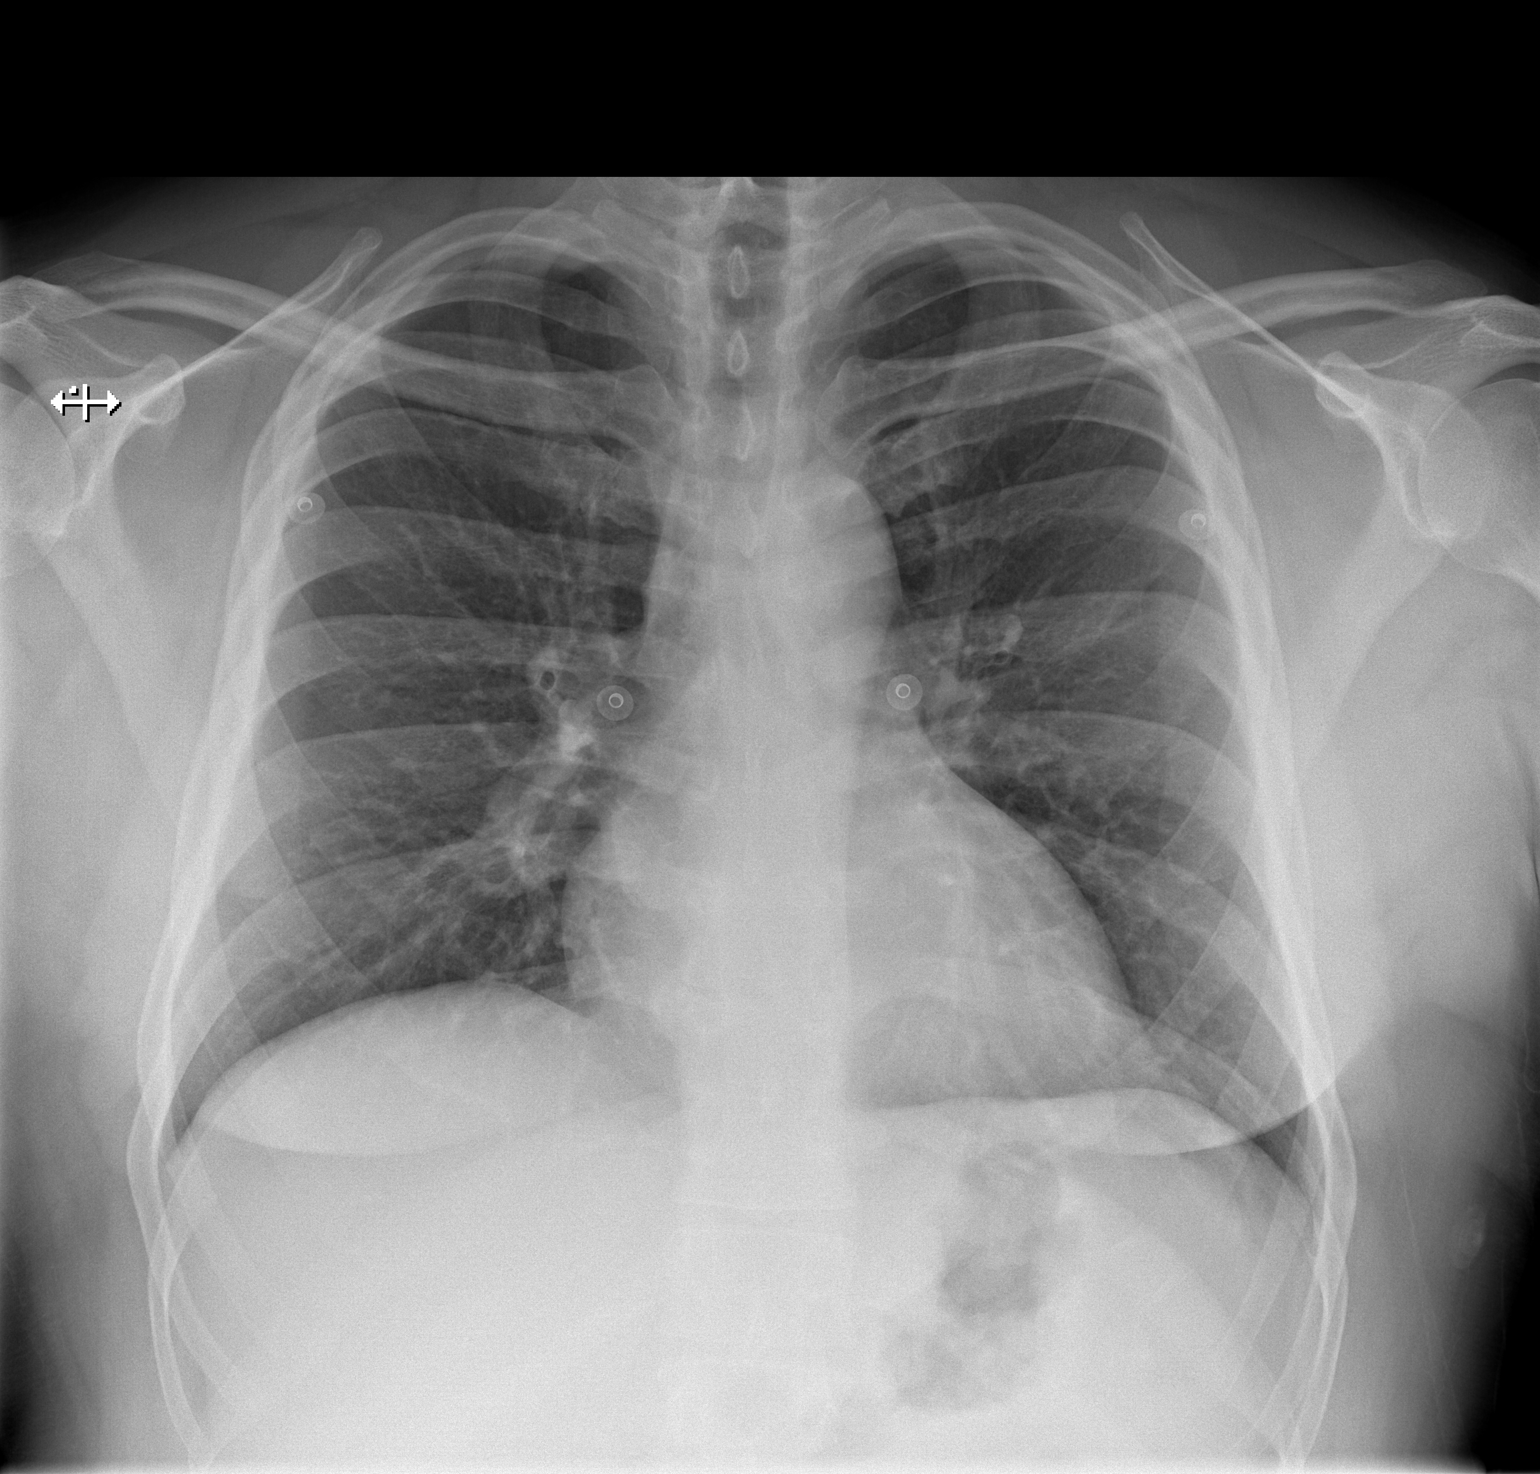

[w chest lat]
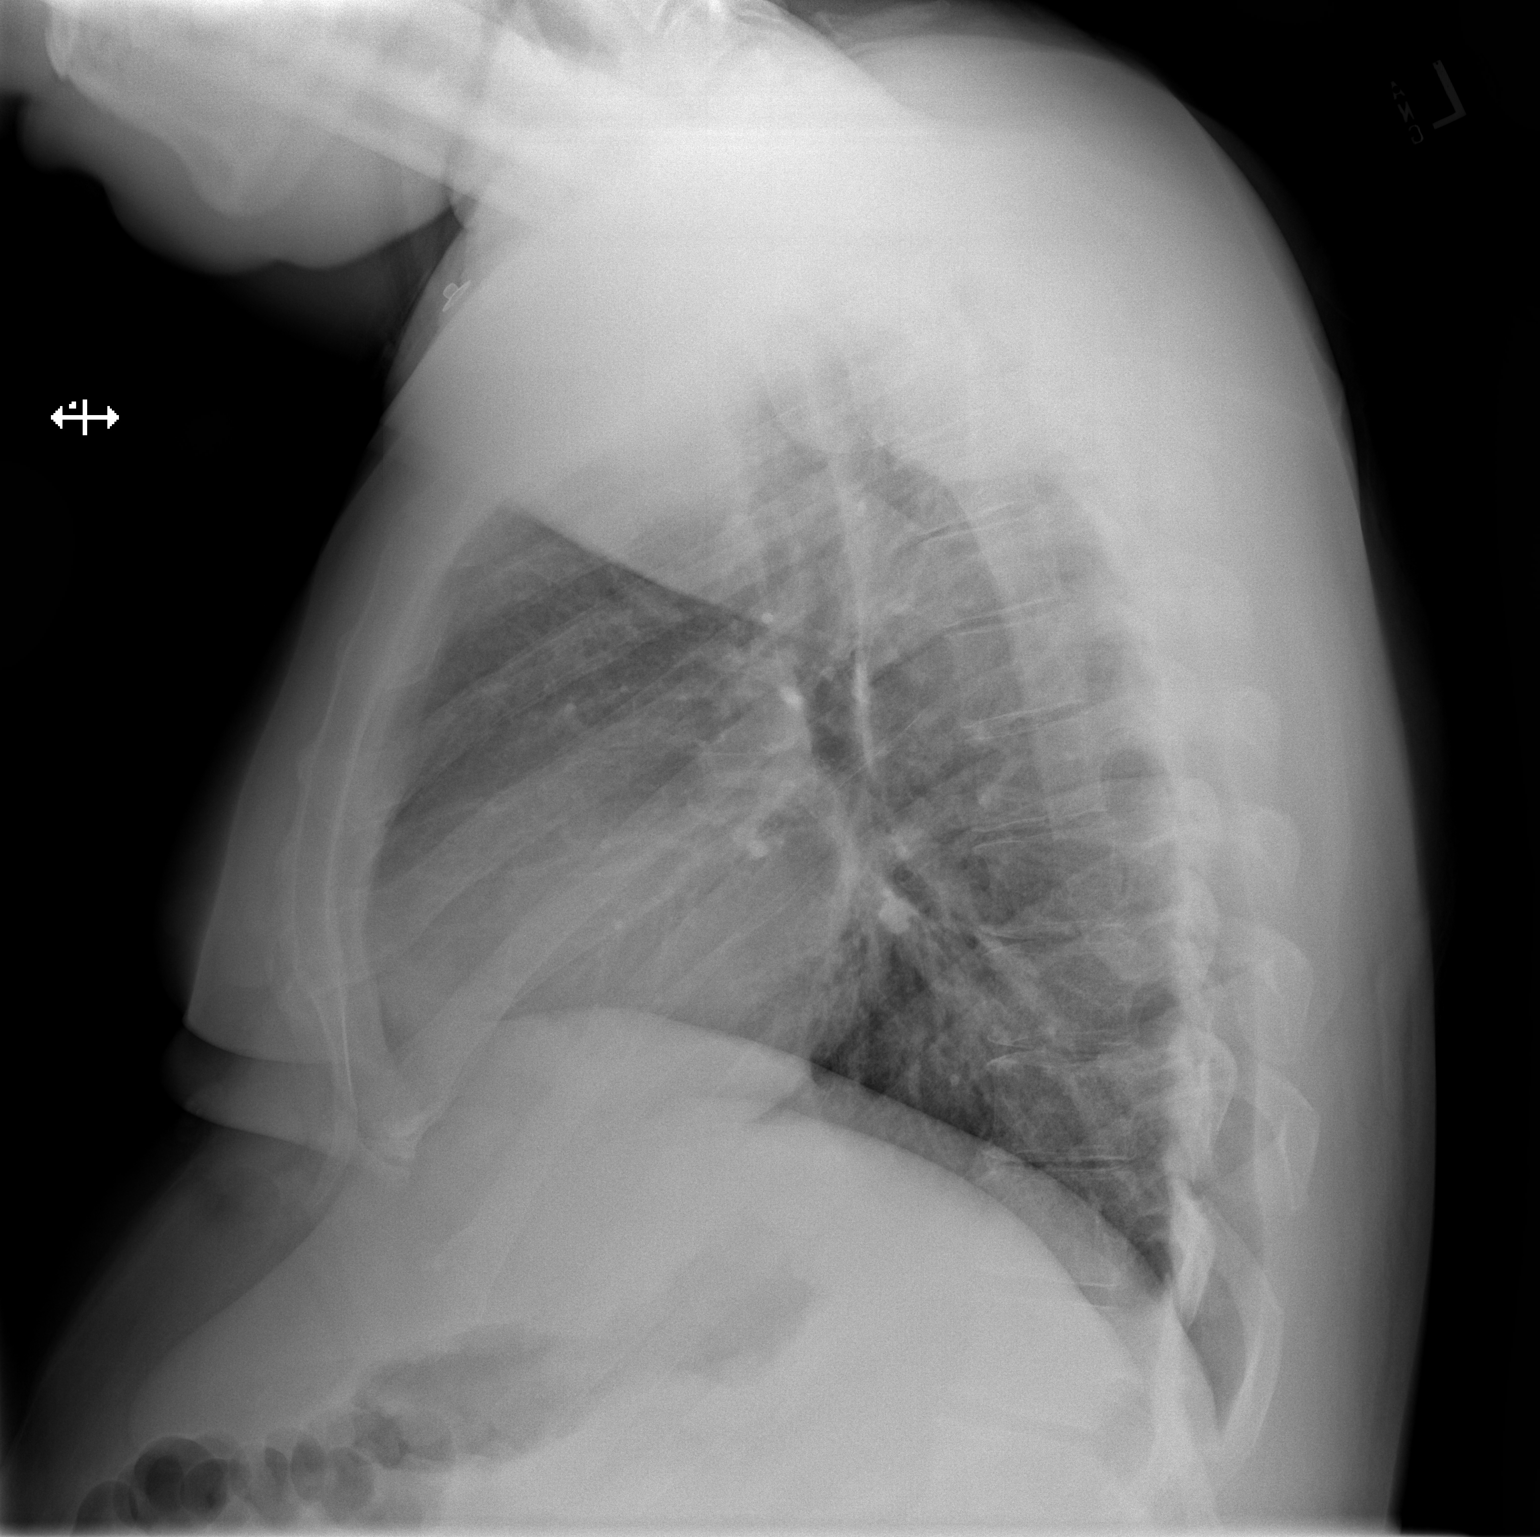

[2 of 2 positions shown; findings below may reference images not displayed]

FINDINGS: Normal heart size and mediastinal contours. No acute infiltrate or
edema. No effusion or pneumothorax. No acute osseous findings.
IMPRESSION: No active cardiopulmonary disease.

## 2022-10-24 ENCOUNTER — Other Ambulatory Visit (HOSPITAL_COMMUNITY): Payer: Self-pay

## 2022-10-24 IMAGING — CR DG CHEST 2V
2 series · 2 of 2 positions shown · non-contrast
Comparison: Portable chest 09/03/2021

CLINICAL DATA: Chest pain and abdominal pain.

EXAM:
CHEST - 2 VIEW

[chest pa]
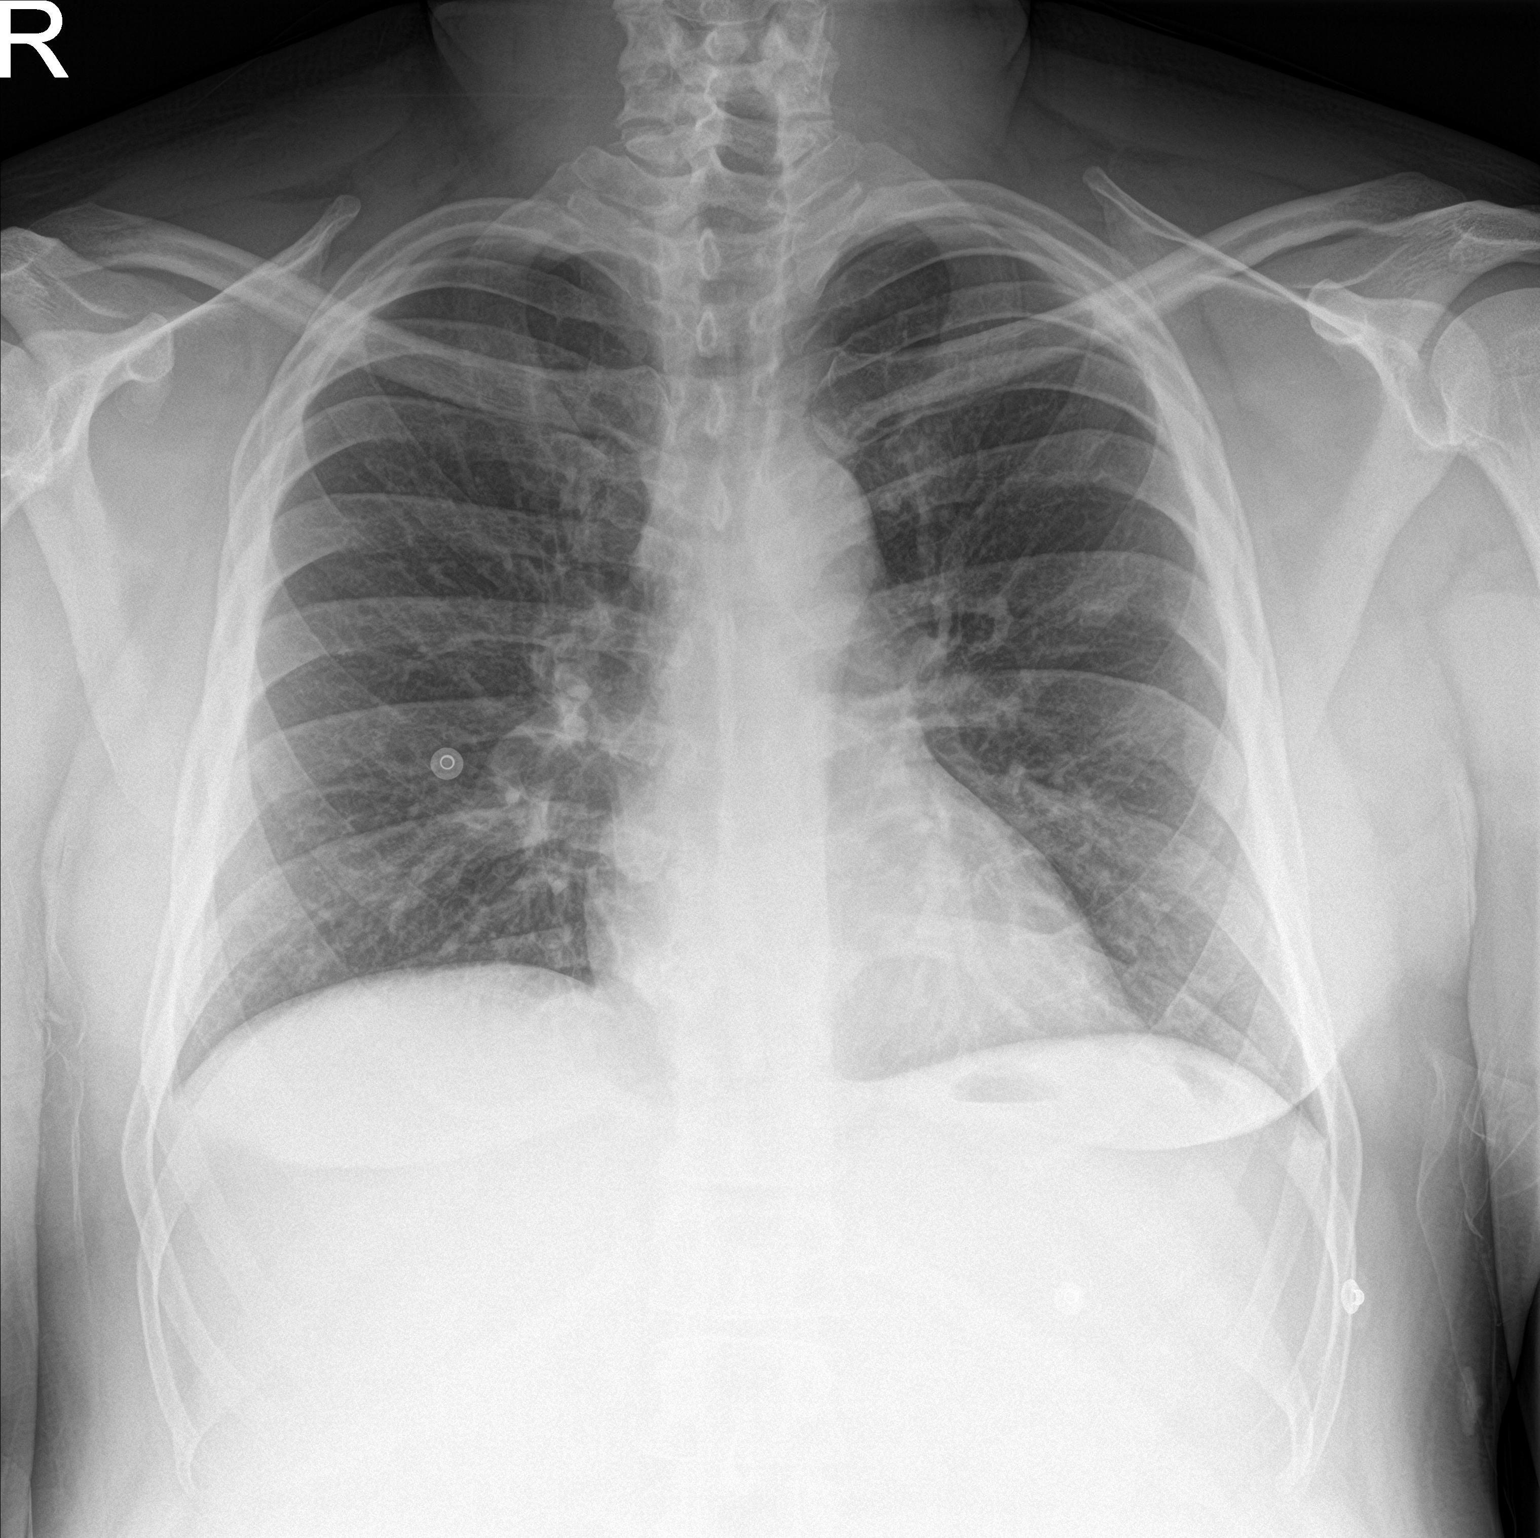

[chest lat]
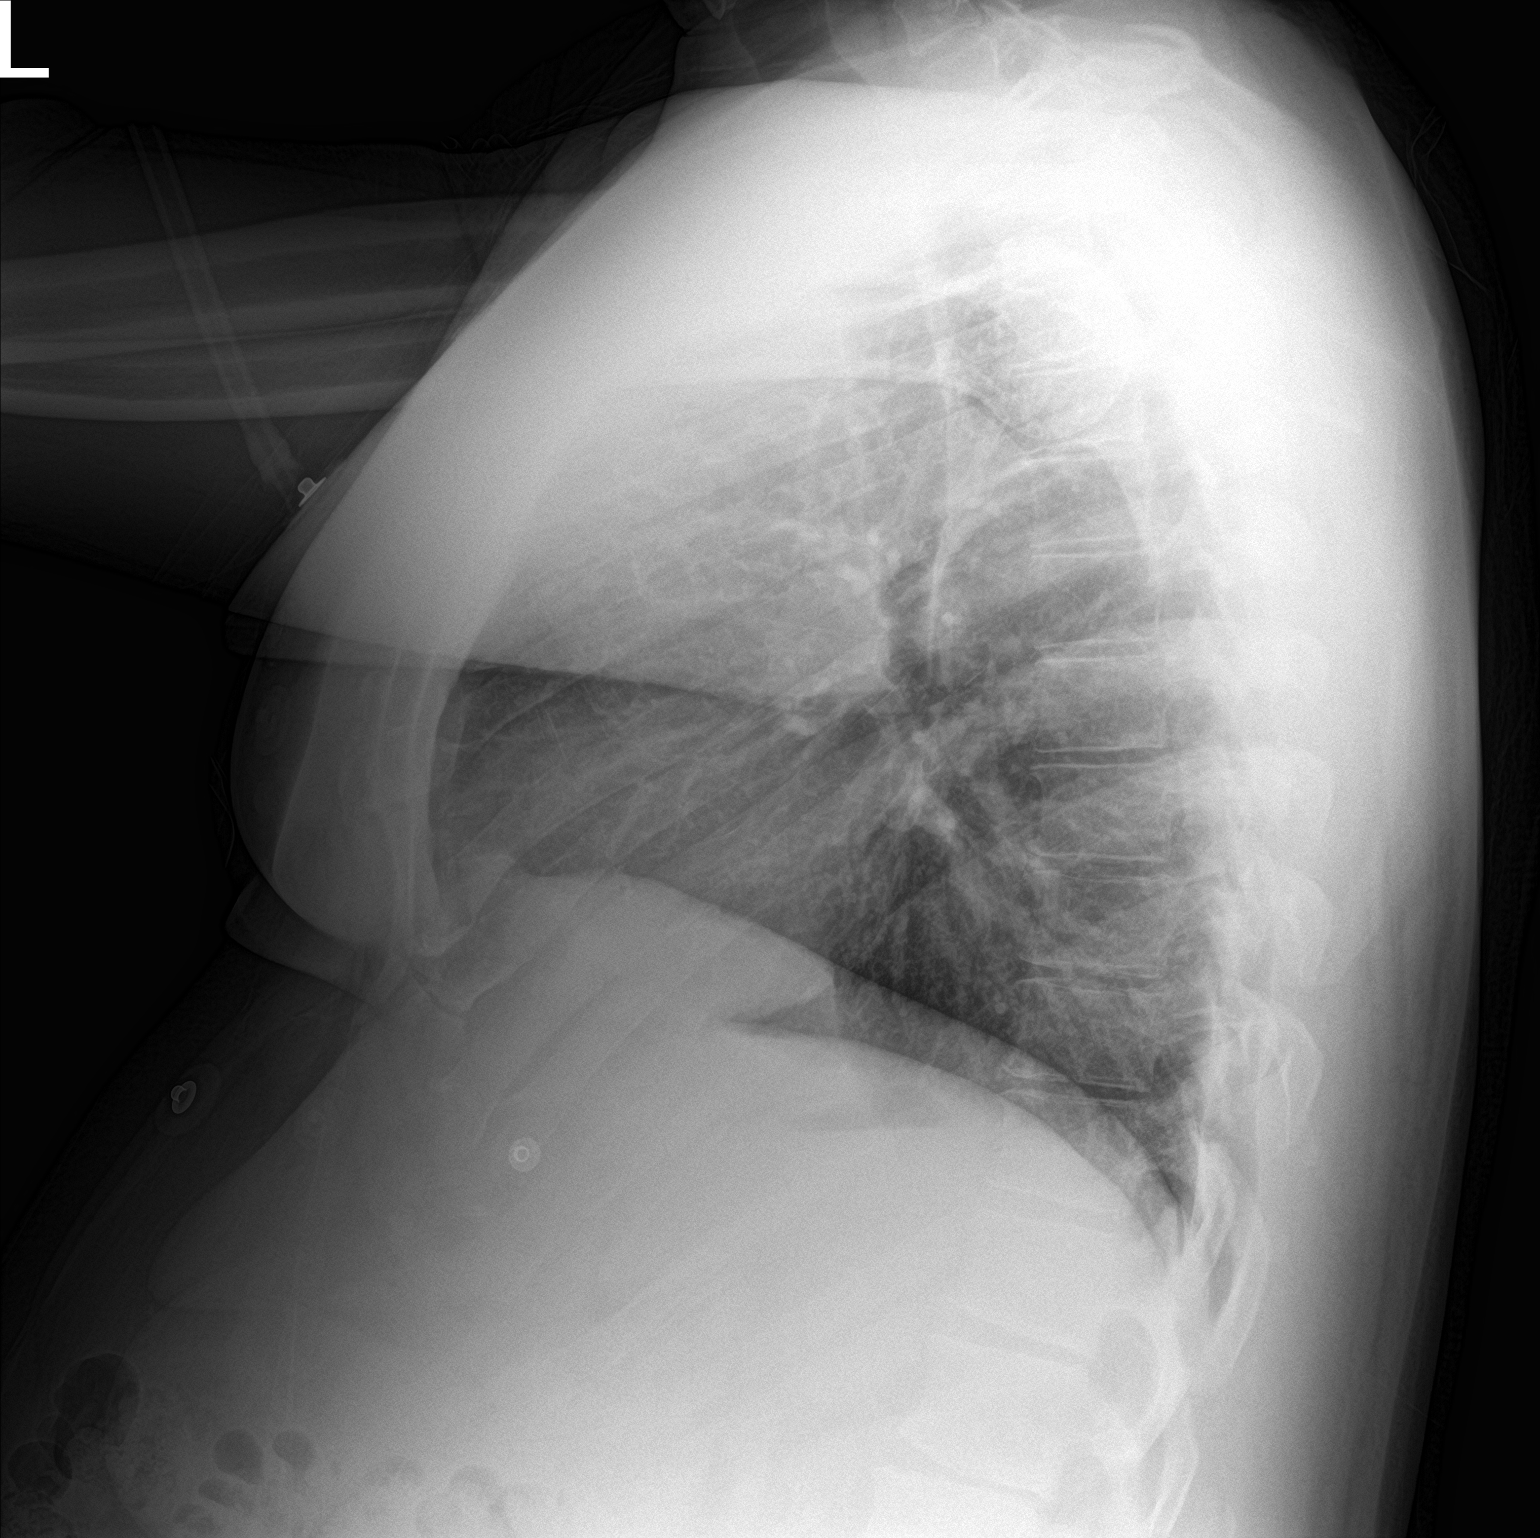

[2 of 2 positions shown; findings below may reference images not displayed]

FINDINGS: The heart size and mediastinal contours are within normal limits.
Both lungs are clear. The visualized skeletal structures are
unremarkable.
IMPRESSION: No active cardiopulmonary disease or interval changes.

## 2022-11-10 ENCOUNTER — Emergency Department (HOSPITAL_COMMUNITY)
Admission: EM | Admit: 2022-11-10 | Discharge: 2022-11-10 | Disposition: A | Discharge status: Home / Self Care | Payer: Medicaid Other | Attending: Emergency Medicine | Admitting: Emergency Medicine

## 2022-11-10 ENCOUNTER — Other Ambulatory Visit: Payer: Self-pay

## 2022-11-10 ENCOUNTER — Encounter (HOSPITAL_COMMUNITY): Payer: Self-pay

## 2022-11-10 DIAGNOSIS — K029 Dental caries, unspecified: Secondary | ICD-10-CM | POA: Diagnosis not present

## 2022-11-10 DIAGNOSIS — I110 Hypertensive heart disease with heart failure: Secondary | ICD-10-CM | POA: Diagnosis not present

## 2022-11-10 DIAGNOSIS — K0889 Other specified disorders of teeth and supporting structures: Secondary | ICD-10-CM | POA: Diagnosis not present

## 2022-11-10 DIAGNOSIS — Z21 Asymptomatic human immunodeficiency virus [HIV] infection status: Secondary | ICD-10-CM | POA: Diagnosis not present

## 2022-11-10 DIAGNOSIS — I509 Heart failure, unspecified: Secondary | ICD-10-CM | POA: Insufficient documentation

## 2022-11-10 MED ORDER — IBUPROFEN 800 MG PO TABS
800.0000 mg | ORAL_TABLET | Freq: Once | ORAL | Status: AC
  Administered 2022-11-10: 800 mg via ORAL
  Filled 2022-11-10: qty 1

## 2022-11-10 MED ORDER — AMOXICILLIN-POT CLAVULANATE 875-125 MG PO TABS: 1.0000 | ORAL_TABLET | Freq: Two times a day (BID) | ORAL | 0 refills | Status: DC

## 2022-11-10 NOTE — ED Provider Notes (Signed)
[facility] Provider Note   CSN: [id number] Arrival date & time: 11/10/22  7322     History  Chief Complaint  Patient presents with   Dental Pain    William Powers is a 42 y.o. male with medical history of heart failure, HIV, hypertension, polysubstance abuse, pulmonary embolism.  Patient presents to ED for evaluation of left lower dental pain.  Patient reports that for the last 2 days he has had left lower dental pain.  Patient reports that he has not seen a dentist in quite some time, patient unable to tell me the last time he saw dentist.  Patient denies fevers, nausea, vomiting.  Patient denies facial swelling, trouble swallowing.  Patient requesting something for pain.   Dental Pain Associated symptoms: no facial swelling and no fever        Home Medications Prior to Admission medications   Medication Sig Start Date End Date Taking? Authorizing Provider  amoxicillin-clavulanate (AUGMENTIN) 875-125 MG tablet Take 1 tablet by mouth every 12 (twelve) hours. 11/10/22  Yes Azucena Cecil, PA-C  Potassium Chloride ER 20 MEQ TBCR Take 20 mEq by mouth daily at 6 (six) AM. Patient not taking: No sig reported 08/07/21 09/05/21  Reubin Milan, MD      Allergies    Bee venom, Lisinopril, and Sulfamethoxazole-trimethoprim    Review of Systems   Review of Systems  Constitutional:  Negative for fever.  HENT:  Positive for dental problem. Negative for facial swelling and trouble swallowing.   Gastrointestinal:  Negative for nausea and vomiting.  All other systems reviewed and are negative.   Physical Exam Updated Vital Signs BP 133/75 (BP Location: Left Arm)   Pulse 97   Temp 98.2 F (36.8 C) (Oral)   Resp 18   Wt 113 kg   SpO2 100%   BMI 30.32 kg/m  Physical Exam Vitals and nursing note reviewed.  Constitutional:      General: He is not in acute distress.    Appearance: He is well-developed.  HENT:      Head: Normocephalic and atraumatic.     Mouth/Throat:     Dentition: Abnormal dentition. Dental caries present. No dental abscesses.     Pharynx: Uvula midline.      Comments: Multiple areas of dental caries. No obvious abscess in need of draining. Uvula midline, handling secretions appropriately. No sign of RPA, PTA.  Eyes:     Conjunctiva/sclera: Conjunctivae normal.  Cardiovascular:     Rate and Rhythm: Normal rate and regular rhythm.     Heart sounds: No murmur heard. Pulmonary:     Effort: Pulmonary effort is normal. No respiratory distress.     Breath sounds: Normal breath sounds.  Abdominal:     Palpations: Abdomen is soft.     Tenderness: There is no abdominal tenderness.  Musculoskeletal:        General: No swelling.     Cervical back: Neck supple.  Skin:    General: Skin is warm and dry.     Capillary Refill: Capillary refill takes less than 2 seconds.  Neurological:     Mental Status: He is alert.  Psychiatric:        Mood and Affect: Mood normal.     ED Results / Procedures / Treatments   Labs (all labs ordered are listed, but only abnormal results are displayed) Labs Reviewed - No data to display  EKG None  Radiology No results found.  Procedures Procedures    Medications Ordered in ED Medications  ibuprofen (ADVIL) tablet 800 mg (has no administration in time range)    ED Course/ Medical Decision Making/ A&P   1}                          Medical Decision Making  42 year old male presents to the ED for evaluation of left lower dental pain.  Please see HPI for further details.  On examination the patient is afebrile and nontachycardic.  Patient lung sounds clear bilaterally, he is not hypoxic.  Patient handling secretions appropriately.  Patient with abnormal dentition as noted in chart.  No obvious abscess in need of draining, uvula midline, patient handling secretions appropriately.  The patient has no sign of RPA, PTA.  Patient airway is  intact.  No facial swelling, low concern for cellulitis.  The patient reports he has not seen a dentist in quite some time.  Patient will be given ibuprofen for pain here.  Patient advised to purchase Orajel at pharmacy.  The patient will be sent home on Augmentin as well as given a list of dental resources to follow-up within the area.  Patient given return precautions and he voiced understanding.  Patient had all of his questions answered to his satisfaction.  The patient is stable for discharge.  Final Clinical Impression(s) / ED Diagnoses Final diagnoses:  Tooth pain    Rx / DC Orders ED Discharge Orders          Ordered    amoxicillin-clavulanate (AUGMENTIN) 875-125 MG tablet  Every 12 hours        11/10/22 0835              Azucena Cecil, PA-C 11/10/22 4540    Valarie Merino, MD 11/10/22 [phone]

## 2022-11-10 NOTE — ED Triage Notes (Signed)
Pt reports left lower dental pain x2 days.

## 2022-11-10 NOTE — Discharge Instructions (Signed)
Return to the ED with any new symptoms such as fevers Please read the attached guide concerning dental pain Please utilize attached resource guide to follow-up with dentist in the area. Please begin taking ibuprofen and Tylenol.  You can alternate these medications every 3-4 hours for pain.  Please follow directions on packaging. Please begin taking Augmentin.  This is your antibiotic.

## 2022-11-14 ENCOUNTER — Emergency Department (HOSPITAL_COMMUNITY)
Admission: EM | Admit: 2022-11-14 | Discharge: 2022-11-14 | Disposition: A | Discharge status: Home / Self Care | Payer: No Typology Code available for payment source | Attending: Student | Admitting: Student

## 2022-11-14 ENCOUNTER — Other Ambulatory Visit: Payer: Self-pay

## 2022-11-14 DIAGNOSIS — I5020 Unspecified systolic (congestive) heart failure: Secondary | ICD-10-CM | POA: Insufficient documentation

## 2022-11-14 DIAGNOSIS — Z59819 Housing instability, housed unspecified: Secondary | ICD-10-CM

## 2022-11-14 DIAGNOSIS — Y906 Blood alcohol level of 120-199 mg/100 ml: Secondary | ICD-10-CM | POA: Diagnosis not present

## 2022-11-14 DIAGNOSIS — R45851 Suicidal ideations: Secondary | ICD-10-CM | POA: Insufficient documentation

## 2022-11-14 DIAGNOSIS — Z5900 Homelessness unspecified: Secondary | ICD-10-CM | POA: Diagnosis not present

## 2022-11-14 DIAGNOSIS — I110 Hypertensive heart disease with heart failure: Secondary | ICD-10-CM | POA: Insufficient documentation

## 2022-11-14 DIAGNOSIS — E876 Hypokalemia: Secondary | ICD-10-CM | POA: Diagnosis not present

## 2022-11-14 DIAGNOSIS — D72819 Decreased white blood cell count, unspecified: Secondary | ICD-10-CM | POA: Insufficient documentation

## 2022-11-14 DIAGNOSIS — F10129 Alcohol abuse with intoxication, unspecified: Secondary | ICD-10-CM | POA: Diagnosis present

## 2022-11-14 DIAGNOSIS — F10920 Alcohol use, unspecified with intoxication, uncomplicated: Secondary | ICD-10-CM

## 2022-11-14 DIAGNOSIS — F1094 Alcohol use, unspecified with alcohol-induced mood disorder: Secondary | ICD-10-CM

## 2022-11-14 LAB — CBC WITH DIFFERENTIAL/PLATELET
Abs Immature Granulocytes: 0.01 10*3/uL (ref 0.00–0.07)
Basophils Absolute: 0 10*3/uL (ref 0.0–0.1)
Basophils Relative: 1 %
Eosinophils Absolute: 0.1 10*3/uL (ref 0.0–0.5)
Eosinophils Relative: 2 %
HCT: 39.2 % (ref 39.0–52.0)
Hemoglobin: 13.1 g/dL (ref 13.0–17.0)
Immature Granulocytes: 0 %
Lymphocytes Relative: 38 %
Lymphs Abs: 1.3 10*3/uL (ref 0.7–4.0)
MCH: 31.3 pg (ref 26.0–34.0)
MCHC: 33.4 g/dL (ref 30.0–36.0)
MCV: 93.6 fL (ref 80.0–100.0)
Monocytes Absolute: 0.4 10*3/uL (ref 0.1–1.0)
Monocytes Relative: 10 %
Neutro Abs: 1.7 10*3/uL (ref 1.7–7.7)
Neutrophils Relative %: 49 %
Platelets: 245 10*3/uL (ref 150–400)
RBC: 4.19 MIL/uL — ABNORMAL LOW (ref 4.22–5.81)
RDW: 14.3 % (ref 11.5–15.5)
WBC: 3.6 10*3/uL — ABNORMAL LOW (ref 4.0–10.5)
nRBC: 0 % (ref 0.0–0.2)

## 2022-11-14 LAB — ETHANOL: Alcohol, Ethyl (B): 133 mg/dL — ABNORMAL HIGH (ref <10)

## 2022-11-14 LAB — COMPREHENSIVE METABOLIC PANEL
ALT: 30 U/L (ref 0–44)
AST: 34 U/L (ref 15–41)
Albumin: 3.4 g/dL — ABNORMAL LOW (ref 3.5–5.0)
Alkaline Phosphatase: 49 U/L (ref 38–126)
Anion gap: 12 (ref 5–15)
BUN: 8 mg/dL (ref 6–20)
CO2: 20 mmol/L — ABNORMAL LOW (ref 22–32)
Calcium: 8.6 mg/dL — ABNORMAL LOW (ref 8.9–10.3)
Chloride: 105 mmol/L (ref 98–111)
Creatinine, Ser: 0.96 mg/dL (ref 0.61–1.24)
GFR, Estimated: >60 mL/min (ref >60)
Glucose, Bld: 100 mg/dL — ABNORMAL HIGH (ref 70–99)
Potassium: 3.1 mmol/L — ABNORMAL LOW (ref 3.5–5.1)
Sodium: 137 mmol/L (ref 135–145)
Total Bilirubin: 0.9 mg/dL (ref 0.3–1.2)
Total Protein: 8.7 g/dL — ABNORMAL HIGH (ref 6.5–8.1)

## 2022-11-14 LAB — CBG MONITORING, ED: Glucose-Capillary: 104 mg/dL — ABNORMAL HIGH (ref 70–99)

## 2022-11-14 LAB — SALICYLATE LEVEL: Salicylate Lvl: <7 mg/dL — ABNORMAL LOW (ref 7.0–30.0)

## 2022-11-14 LAB — ACETAMINOPHEN LEVEL: Acetaminophen (Tylenol), Serum: <10 ug/mL — ABNORMAL LOW (ref 10–30)

## 2022-11-14 IMAGING — CR DG CHEST 2V
2 series · 2 of 2 positions shown · non-contrast
Comparison: 09/14/2021

CLINICAL DATA: Dizziness

EXAM:
CHEST - 2 VIEW

[chest pa]
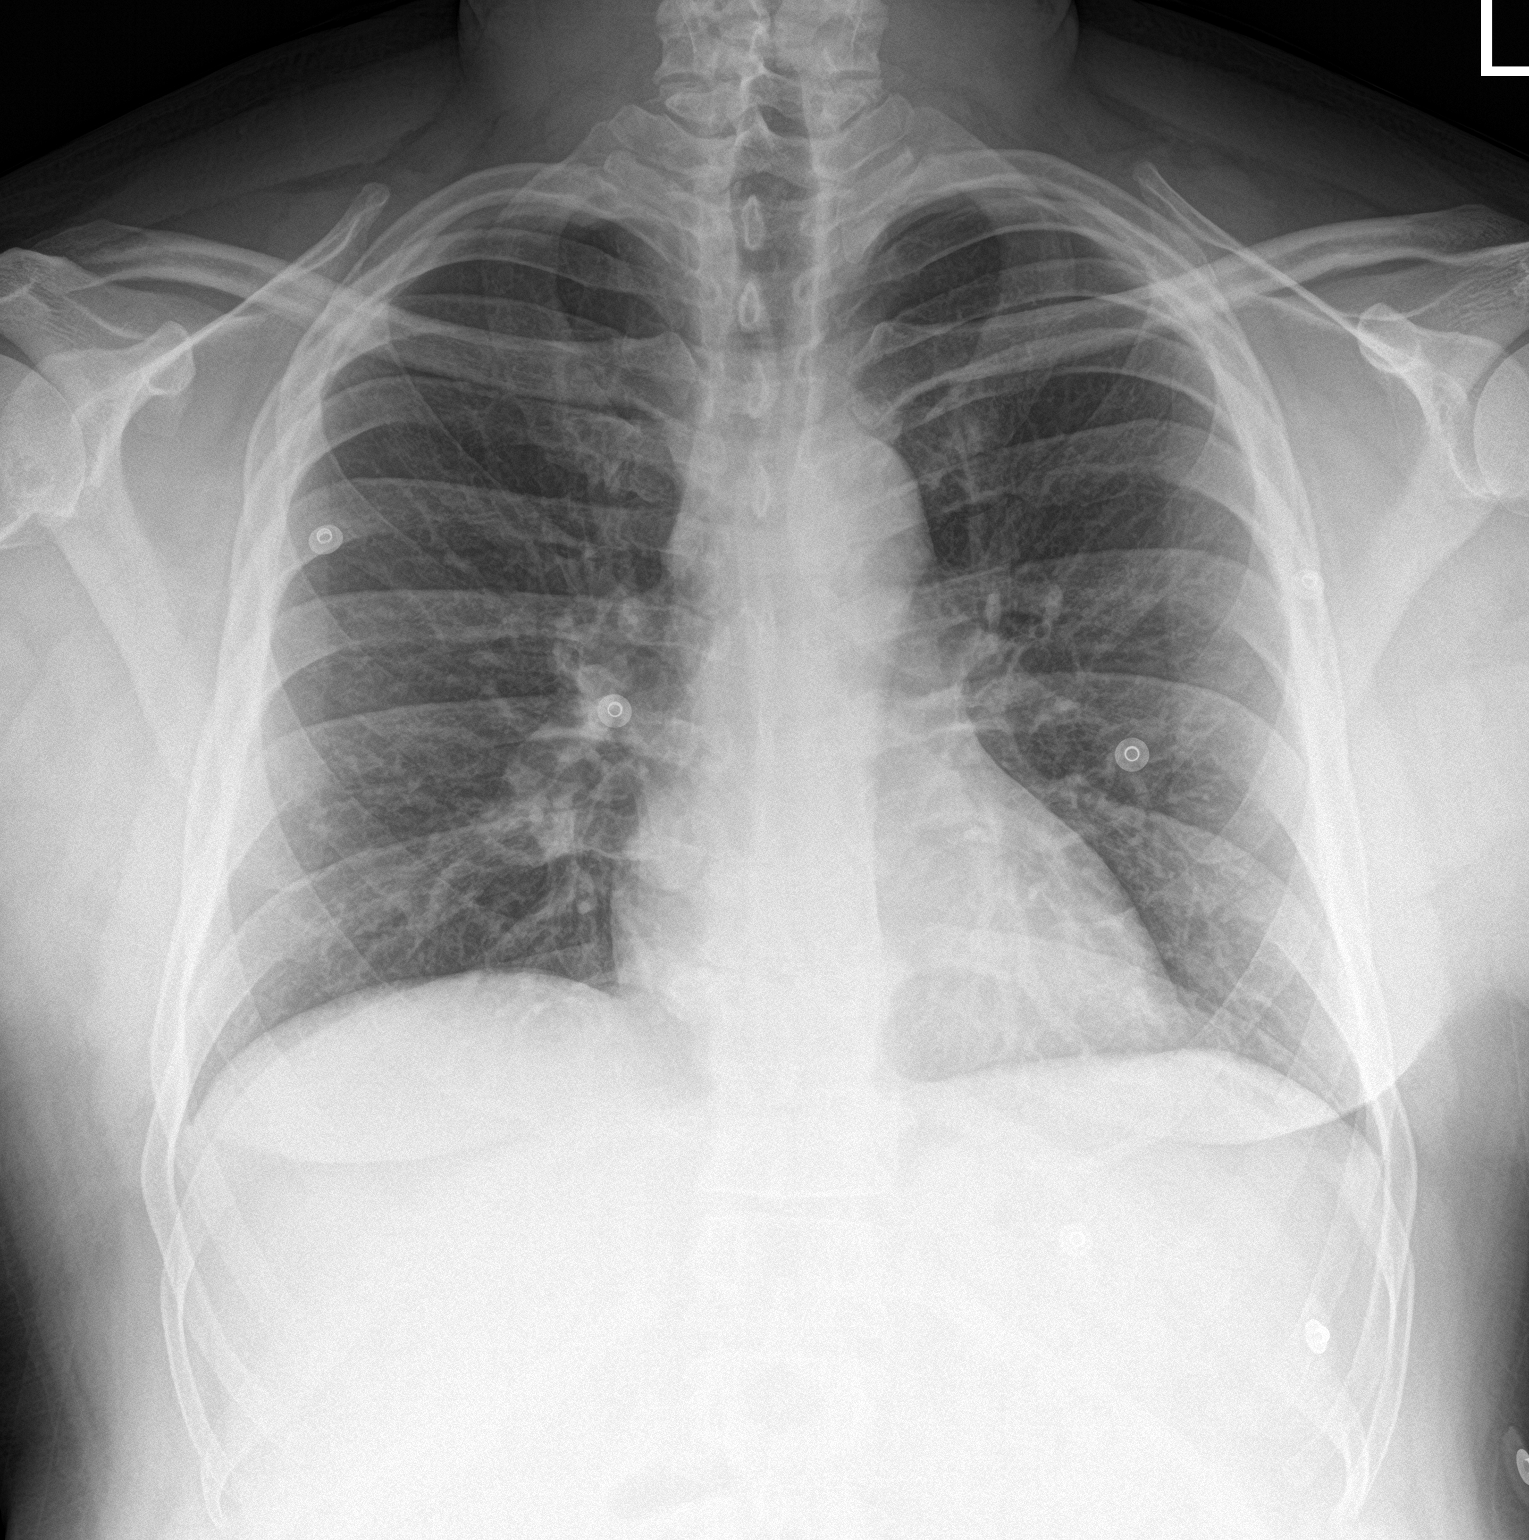

[chest lat]
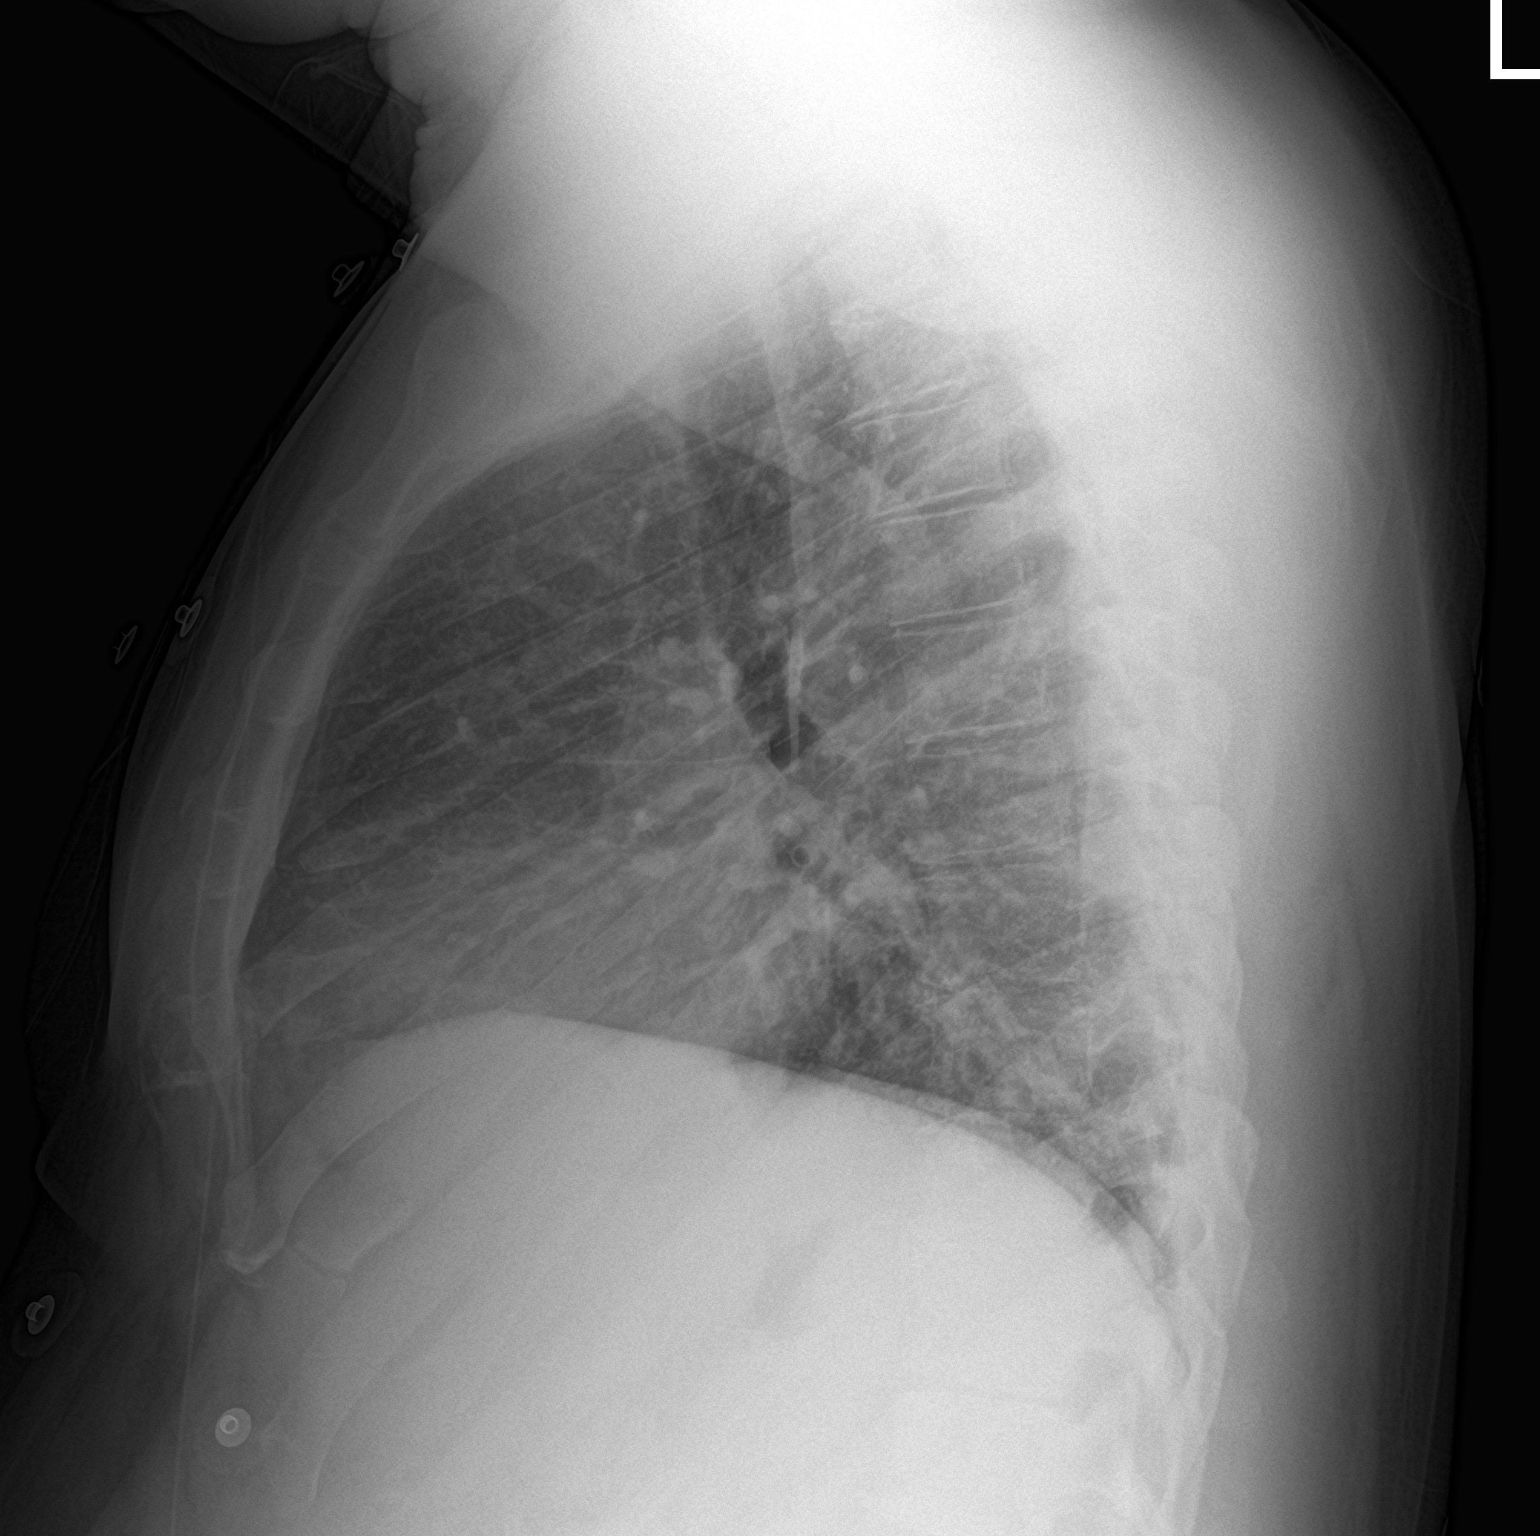

[2 of 2 positions shown; findings below may reference images not displayed]

FINDINGS: Cardiac shadow is within normal limits. Lungs are well aerated
bilaterally. No focal infiltrate or effusion is seen. No acute bony
abnormality is noted.
IMPRESSION: No active cardiopulmonary disease.

## 2022-11-14 MED ORDER — IBUPROFEN 400 MG PO TABS: 400.0000 mg | ORAL_TABLET | Freq: Four times a day (QID) | ORAL | Status: DC | PRN

## 2022-11-14 MED ORDER — POTASSIUM CHLORIDE CRYS ER 20 MEQ PO TBCR
40.0000 meq | EXTENDED_RELEASE_TABLET | Freq: Once | ORAL | Status: AC
  Administered 2022-11-14: 40 meq via ORAL
  Filled 2022-11-14: qty 2

## 2022-11-14 MED ORDER — AMOXICILLIN-POT CLAVULANATE 875-125 MG PO TABS
1.0000 | ORAL_TABLET | Freq: Two times a day (BID) | ORAL | Status: DC
  Administered 2022-11-14: 1 via ORAL
  Filled 2022-11-14: qty 1

## 2022-11-14 NOTE — ED Notes (Signed)
Patient speaking with counselor TTS in a private room.

## 2022-11-14 NOTE — ED Provider Notes (Signed)
[facility] Provider Note   CSN: [id number] Arrival date & time: 11/14/22  0201     History  Chief Complaint  Patient presents with   Suicidal    William Powers is a 42 y.o. male.  42 y/o male with hx of HTN, HFrEF (LVEF 55-60% in 2022), PE (not on anticoagulation), and polysubstance abuse presents for suicidal ideation without active plan, back pain, left-sided dental pain.  He was seen recently and evaluated for his dental pain, placed on Augmentin about 4 days ago.  He reports that he has been taking antibiotics.  He denies any active plan to kill himself, HI, AVH.        Home Medications Prior to Admission medications   Medication Sig Start Date End Date Taking? Authorizing Provider  amoxicillin-clavulanate (AUGMENTIN) 875-125 MG tablet Take 1 tablet by mouth every 12 (twelve) hours. 11/10/22   Azucena Cecil, PA-C  Potassium Chloride ER 20 MEQ TBCR Take 20 mEq by mouth daily at 6 (six) AM. Patient not taking: No sig reported 08/07/21 09/05/21  Reubin Milan, MD      Allergies    Bee venom, Lisinopril, and Sulfamethoxazole-trimethoprim    Review of Systems   Review of Systems Ten systems reviewed and are negative for acute change, except as noted in the HPI.    Physical Exam Updated Vital Signs BP (!) 105/57 (BP Location: Right Arm)   Pulse 91   Temp 98.8 F (37.1 C) (Oral)   Resp 20   SpO2 100%   Physical Exam Vitals and nursing note reviewed.  Constitutional:      General: He is not in acute distress.    Appearance: He is well-developed. He is not diaphoretic.  HENT:     Head: Normocephalic and atraumatic.  Eyes:     General: No scleral icterus.    Conjunctiva/sclera: Conjunctivae normal.  Pulmonary:     Effort: Pulmonary effort is normal. No respiratory distress.  Musculoskeletal:        General: Normal range of motion.     Cervical back: Normal range of motion.  Skin:    General: Skin is  warm and dry.     Coloration: Skin is not pale.     Findings: No erythema or rash.  Neurological:     Mental Status: He is alert and oriented to person, place, and time.  Psychiatric:        Behavior: Behavior normal.     ED Results / Procedures / Treatments   Labs (all labs ordered are listed, but only abnormal results are displayed) Labs Reviewed  COMPREHENSIVE METABOLIC PANEL - Abnormal; Notable for the following components:      Result Value   Potassium 3.1 (*)    CO2 20 (*)    Glucose, Bld 100 (*)    Calcium 8.6 (*)    Total Protein 8.7 (*)    Albumin 3.4 (*)    All other components within normal limits  SALICYLATE LEVEL - Abnormal; Notable for the following components:   Salicylate Lvl <9.3 (*)    All other components within normal limits  ACETAMINOPHEN LEVEL - Abnormal; Notable for the following components:   Acetaminophen (Tylenol), Serum <10 (*)    All other components within normal limits  ETHANOL - Abnormal; Notable for the following components:   Alcohol, Ethyl (B) 133 (*)    All other components within normal limits  CBC WITH DIFFERENTIAL/PLATELET - Abnormal; Notable for  the following components:   WBC 3.6 (*)    RBC 4.19 (*)    All other components within normal limits  CBG MONITORING, ED - Abnormal; Notable for the following components:   Glucose-Capillary 104 (*)    All other components within normal limits  RAPID URINE DRUG SCREEN, HOSP PERFORMED    EKG None  Radiology No results found.  Procedures Procedures    Medications Ordered in ED Medications  amoxicillin-clavulanate (AUGMENTIN) 875-125 MG per tablet 1 tablet (has no administration in time range)  ibuprofen (ADVIL) tablet 400 mg (has no administration in time range)    ED Course/ Medical Decision Making/ A&P Clinical Course as of 11/14/22 0616  Fri Nov 14, 2022  0441 CBG 104. TTS ready for assessment. [KH]  0518 TTS completed. Evette Georges, NP recommends continuous observation to  be reassessed in the AM. [KH]    Clinical Course User Index [KH] Antonietta Breach, PA-C                             Medical Decision Making Risk Prescription drug management.   This patient presents to the ED for concern of suicidal ideation, this involves an extensive number of treatment options, and is a complaint that carries with it a high risk of complications and morbidity.  The differential diagnosis includes suicide attempt vs MDD vs substance induced mood disorder   Co morbidities that complicate the patient evaluation  Polysubstance abuse HTN HFrEF   Additional history obtained:  External records from outside source obtained and reviewed including CTA chest in November 2023, negative for PE   Lab Tests:  I Ordered, and personally interpreted labs.  The pertinent results include:  Leukopenia of 3.6 and hypokalemia of 3.1 (both stable compared to 2 months prior). Ethanol 133.    Cardiac Monitoring:  The patient was maintained on a cardiac monitor.  I personally viewed and interpreted the cardiac monitored which showed an underlying rhythm of: NSR   Medicines ordered and prescription drug management:  I ordered medication including oral potassium for hypokalemia repletion  Reevaluation of the patient after these medicines showed that the patient stayed the same I have reviewed the patients home medicines and have made adjustments as needed   Consultations Obtained:  I requested consultation with TTS - they recommend: overnight observation in the ED with reassessment by psychiatry in the morning.   Problem List / ED Course:  As above   Reevaluation:  After the interventions noted above, I reevaluated the patient and found that they have :stayed the same   Social Determinants of Health:  Polysubstance abuse history   Dispostion:  Patient pending psychiatric reassessment to assist in disposition.  Care to be assumed by oncoming ED  provider.         Final Clinical Impression(s) / ED Diagnoses Final diagnoses:  Suicidal ideation    Rx / DC Orders ED Discharge Orders     None         Antonietta Breach, PA-C 11/14/22 0454    Teressa Lower, MD 11/14/22 [phone]

## 2022-11-14 NOTE — BH Assessment (Signed)
Comprehensive Clinical Assessment (CCA) Note  11/14/2022 William Powers [id number]  Disposition: Clinical report given to Evette Georges, NP who recommends overnight observation to be reassessed by psychiatry in the morning.  The patient demonstrates the following risk factors for suicide: Chronic risk factors for suicide include: substance use disorder. Acute risk factors for suicide include: social withdrawal/isolation. Protective factors for this patient include: hope for the future. Considering these factors, the overall suicide risk at this point appears to be low. Patient is not appropriate for outpatient follow up.  Patient is a 42 y.o. single males who presents voluntarily to [facility] ED due to suicidal ideations. When asked if he has a plan, patient states "I plan to do anything." Per chart review, patient has a history of depression, suicidal ideation and polysubstance abuse. Pt states he has been having SI for the past 2 or 3 days due to being "mad at life." Patient denies previous suicide attempts, however he reports he was hospitalized not too long ago in [place], due to [name]. Patient denies any recent manic symptoms. Patient denies HI, auditory or visual hallucinations. Patient reports he drank brown liquor today, however he does not know the amount. Patient denies any additional substance use recently.  Patient is unable to identify a specific stressor and continues to report "life" is his stressor. Patient says he was on the street prior to bringing himself to the hospital. He states he lives at hotels and his aunts home in [place] sometimes. Patient is currently homeless. Patient identifies God as his support. Patient denies any history of abuse or trauma.  Patient is currently not receiving any mental health services.  Patient is dressed in scrubs, oriented x4 with normal speech. Patient appears tired and closes his eyes between answering questions. Patient's thought process is  coherent and there is no indication he is responding to internal stimuli. Patient was cooperative throughout assessment. When asked what he thinks would be helpful, patient states he wants to detox.  Chief Complaint:  Chief Complaint  Patient presents with   Suicidal   Visit Diagnosis:  Suicidal Homelessness    CCA Screening, Triage and Referral (STR)  Patient Reported Information How did you hear about [place]? Self  What Is the Reason for Your Visit/Call Today? Patient reports he has been having suicdial thoughts for about 2 days. When asked if he has a plan, patient states "I plan to do anything." Patient denies HI, auditory or visual hallucinations.  How Long Has This Been Causing You Problems? <Week  What Do You Feel Would Help You the Most Today? Treatment for Depression or other mood problem; Alcohol or Drug Use Treatment   Have You Recently Had Any Thoughts About Hurting Yourself? Yes  Are You Planning to Commit Suicide/Harm Yourself At This time? Yes   [place] ED from 11/14/2022 in [facility] at [facility] ED from 11/10/2022 in [facility] Department at [facility] ED from 09/12/2022 in [facility] Emergency Department at [place] No Risk No Risk       Have you Recently Had Thoughts About [place]? No  Are You Planning to Harm Someone at This Time? No  Explanation: N/A   Have You Used Any Alcohol or Drugs in the Past 24 Hours? Yes  What Did You Use and How Much? Patient reports drinking an unknown amount of liquor   Do You Currently Have a Therapist/Psychiatrist? No  Name of Therapist/Psychiatrist: Name of Therapist/Psychiatrist: N/A   Have You Been Recently Discharged From Any Office Practice or Programs? -- (N/A)  Explanation of Discharge From Practice/Program: N/A     CCA Screening Triage Referral Assessment Type of Contact:  Tele-Assessment  Telemedicine Service Delivery: Telemedicine service delivery: This service was provided via telemedicine using a 2-way, interactive audio and video technology  Is this Initial or Reassessment? Is this Initial or Reassessment?: Initial Assessment  Date Telepsych consult ordered in [facility]:  Date Telepsych consult ordered in [facility]: 11/14/22  Time Telepsych consult ordered in [facility]:  Time Telepsych consult ordered in [facility]: 0211  Location of Assessment: [facility] ED  Provider Location: [facility] Assessment Services   Collateral Involvement: None   Does Patient Have a Automotive engineer Guardian? No  Legal Guardian Contact Information: N/A  Copy of Legal Guardianship Form: -- (N/A)  Legal Guardian Notified of Arrival: -- (N/A)  Legal Guardian Notified of Pending Discharge: -- (N/A)  If Minor and Not Living with Parent(s), Who has Custody? N/A  Is CPS involved or ever been involved? Never  Is APS involved or ever been involved? Never   Patient Determined To Be At Risk for Harm To Self or Others Based on Review of Patient Reported Information or Presenting Complaint? Yes, for Self-Harm  Method: No Plan  Availability of Means: No access or NA  Intent: Vague intent or NA  Notification Required: No need or identified person  Additional Information for Danger to Others Potential: -- (N/A)  Additional Comments for Danger to Others Potential: N/A  Are There Guns or Other Weapons in Your Home? No  Types of Guns/Weapons: N/A  Are These Weapons Safely Secured?                            -- (N/A)  Who Could Verify You Are Able To Have These Secured: N/A  Do You Have any Outstanding Charges, Pending Court Dates, Parole/Probation? None  Contacted To Inform of Risk of Harm To Self or Others: -- (N/A)    Does Patient Present under Involuntary Commitment? No    [place] of Residence: [place]   Patient Currently Receiving the Following Services: Not Receiving  Services   Determination of Need: Emergent (2 hours)   Options For Referral: Medication Management; Outpatient Therapy; Inpatient Hospitalization     CCA Biopsychosocial Patient Reported Schizophrenia/Schizoaffective Diagnosis in Past: No   Strengths: Patient interested in detoxing.   Mental Health Symptoms Depression:   Hopelessness   Duration of Depressive symptoms:  Duration of Depressive Symptoms: Less than two weeks   Mania:   None   Anxiety:    None   Psychosis:   None   Duration of Psychotic symptoms:    Trauma:   None   Obsessions:   None   Compulsions:   None   Inattention:   None   Hyperactivity/Impulsivity:   None   Oppositional/Defiant Behaviors:   None   Emotional Irregularity:   None   Other Mood/Personality Symptoms:   N/A    Mental Status Exam Appearance and self-care  Stature:   Tall   Weight:   Thin   Clothing:   -- (Hospital scrubs)   Grooming:   Normal   Cosmetic use:   None   Posture/gait:   Normal   Motor activity:   Not Remarkable   Sensorium  Attention:   Normal   Concentration:   Normal   Orientation:  X5   Recall/memory:   Normal   Affect and Mood  Affect:   Flat   Mood:   Depressed   Relating  Eye contact:   None   Facial expression:   Depressed   Attitude toward examiner:   Cooperative   Thought and Language  Speech flow:  Normal   Thought content:   Appropriate to Mood and Circumstances   Preoccupation:   None   Hallucinations:   None   Organization:   Linear   Company secretary of Knowledge:   Average   Intelligence:   Average   Abstraction:   Normal   Judgement:   Fair   Dance movement psychotherapist:   Realistic   Insight:   Fair   Decision Making:   Normal   Social Functioning  Social Maturity:   Isolates   Social Judgement:   "Street Smart"   Stress  Stressors:   Other (Comment) ("Life")   Coping Ability:   Deficient supports    Skill Deficits:   None   Supports:   Other (Comment) ("God")     Religion: Religion/Spirituality Are You A Religious Person?: No How Might This Affect Treatment?: N/A  Leisure/Recreation: Leisure / Recreation Do You Have Hobbies?: No  Exercise/Diet: Exercise/Diet Do You Exercise?: No Have You Gained or Lost A Significant Amount of Weight in the Past Six Months?: No Do You Follow a Special Diet?: No Do You Have Any Trouble Sleeping?: Yes Explanation of Sleeping Difficulties: Patient reports only sleeping 2 hours at a time   CCA Employment/Education Employment/Work Situation: Employment / Work Situation Employment Situation: On disability Why is Patient on Disability: Due to heart failure How Long has Patient Been on Disability: Since age 53 Patient's Job has Been Impacted by Current Illness: No Has Patient ever Been in the [employer]?: No  Education: Education Is Patient Currently Attending School?: No Last Grade Completed: 12 Did You Attend College?: No Did You Have An Individualized Education Program (IIEP): No Did You Have Any Difficulty At School?: No Patient's Education Has Been Impacted by Current Illness: No   CCA Family/Childhood History Family and Relationship History: Family history Marital status: Single Does patient have children?: No  Childhood History:  Childhood History By whom was/is the patient raised?: Mother Did patient suffer any verbal/emotional/physical/sexual abuse as a child?: No Did patient suffer from severe childhood neglect?: No Has patient ever been sexually abused/assaulted/raped as an adolescent or adult?: No Was the patient ever a victim of a crime or a disaster?: No Witnessed domestic violence?: No Has patient been affected by domestic violence as an adult?: No       CCA Substance Use Alcohol/Drug Use: Alcohol / Drug Use Pain Medications: See MAR Prescriptions: See MAR Over the Counter: See MAR History of alcohol /  drug use?: Yes Longest period of sobriety (when/how long): 1 week Negative Consequences of Use:  (N/A) Withdrawal Symptoms: Tremors Substance #1 Name of Substance 1: Alcohol 1 - Age of First Use: 19 1 - Amount (size/oz): Varies 1 - Frequency: Daily 1 - Duration: Ongoing 1 - Last Use / Amount: Today 1 - Method of Aquiring: Purchase 1- Route of Use: Oral                       ASAM's:  Six Dimensions of Multidimensional Assessment  Dimension 1:  Acute Intoxication and/or Withdrawal Potential:   Dimension 1:  Description of individual's past and current experiences of substance use and  withdrawal: Patient reports experiencing tremors at times  Dimension 2:  Biomedical Conditions and Complications:   Dimension 2:  Description of patient's biomedical conditions and  complications: Patient reports no physical conditions related to substance ues  Dimension 3:  Emotional, Behavioral, or Cognitive Conditions and Complications:  Dimension 3:  Description of emotional, behavioral, or cognitive conditions and complications: Patient reports suicidal ideations  Dimension 4:  Readiness to Change:  Dimension 4:  Description of Readiness to Change criteria: Patient states he wants to detox  Dimension 5:  Relapse, Continued use, or Continued Problem Potential:  Dimension 5:  Relapse, continued use, or continued problem potential critiera description: Patient has a history of continued substance use  Dimension 6:  Recovery/Living Environment:  Dimension 6:  Recovery/Iiving environment criteria description: Patient is currently homeless  ASAM Severity Score: ASAM's Severity Rating Score: 4  ASAM Recommended Level of Treatment: ASAM Recommended Level of Treatment: Level I Outpatient Treatment   Substance use Disorder (SUD) Substance Use Disorder (SUD)  Checklist Symptoms of Substance Use: Persistent desire or unsuccessful efforts to cut down or control use, Presence of craving or strong urge to  use  Recommendations for Services/Supports/Treatments: Recommendations for Services/Supports/Treatments Recommendations For Services/Supports/Treatments: IOP (Intensive Outpatient Program), Detox  Discharge Disposition:    DSM5 Diagnoses: Patient Active Problem List   Diagnosis Date Noted   Dental caries 09/13/2022   Substance abuse ([place]) 05/03/2022   Polysubstance abuse ([place]) 04/29/2022   Alcohol use disorder, severe, dependence ([place]) 03/16/2022   Recurrent major depression-severe ([place]) 02/23/2022   Alcohol abuse    Vitamin B1 deficiency 09/19/2021   Adjustment disorder with depressed mood    History of CHF (congestive heart failure) 08/07/2021   H/O ETOH abuse 08/07/2021   Chronic HFrEF (heart failure with reduced ejection fraction) ([place]) 07/23/2021   HIV (human immunodeficiency virus infection) ([place]) 07/23/2021   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

## 2022-11-14 NOTE — ED Triage Notes (Signed)
Patient reports suicidal ideation , he did not disclose his plan of suicide , denies hallucinations , No HI .

## 2022-11-14 NOTE — Discharge Instructions (Addendum)
It was our pleasure to provide your ER care today - we hope that you feel better.  Avoid alcohol use as it is harmful to your physical health and mental well-being. See resource guide attached in terms of accessing inpatient or outpatient substance use treatment programs.   Follow up closely with primary care doctor, dentist, and behavioral health provider in the coming week.  Your potassium level is low - eat plenty of fruits and vegetables (see attached info) and follow up with primary care doctor in 1-2 weeks.   For mental health issues and/or crisis, you may also go directly to the [facility] - they are open 24/7 and walk-ins are welcome.    Return to ER if worse, new symptoms, fevers, chest pain, trouble breathing, facial/neck pain and swelling, or other emergency concern.

## 2022-11-14 NOTE — ED Provider Notes (Signed)
Emergency Medicine Observation Re-evaluation Note  Jerardo Costabile is a 42 y.o. male, seen on rounds today.  Pt initially presented to the ED for complaints of with etoh intoxication. On review chart, transient SI earlier, that pt currently denies. Pt indicates he needed place to stay/sleep last night, and would like something to eat this AM, but otherwise indicates he feels ready for discharge. No current c/o or pain.   Physical Exam  BP 94/72 (BP Location: Left Arm)   Pulse 90   Temp 98.8 F (37.1 C) (Oral)   Resp 18   SpO2 99%  Physical Exam General: resting, easily aroused.  Cardiac: regular rate.  Lungs: breathing comfortably. Psych: normal mood and affect, conversant. Pt does not appear acutely depressed or despondent. No thoughts of harm to self or others. Pt is not responding to internal stimuli - no delusions, hallucinations or acute psychosis noted.   ED Course / MDM   I have reviewed the labs performed to date as well as medications administered while in observation.  Recent changes in the last 24 hours include ED obs, reassessment.   Plan  Pt reports feeling improved and ready for d/c.  He has normal appetite, asks for something to eat/drink - provided.   Also rec etoh tx program - will provide resource guide for same as well as other social service, [place] and shelter resources.   Pt currently appears stable for d/c.   Return precautions provided.       Lajean Saver, MD 11/14/22 [phone]

## 2022-11-14 NOTE — ED Provider Triage Note (Signed)
Emergency Medicine Provider Triage Evaluation Note  Tiyon Sanor , a 42 y.o. male  was evaluated in triage.  Pt complains of suicidal ideation without active plan, back pain, left-sided dental pain.  He was seen recently and evaluated for his dental pain, placed on Augmentin about 4 days ago.  He reports that he has been taking antibiotics.  He denies any active plan to kill himself, HI, AVH.  I offered safe transport to [facility] based on his symptoms with patient would like evaluation in the emergency department at this time.  Review of Systems  Positive: Back pain, dental pain, si Negative: HI, AVH  Physical Exam  There were no vitals taken for this visit. Gen:   Awake, no distress   Resp:  Normal effort  MSK:   Moves extremities without difficulty  Other:  Patient with significant dental disease, broken teeth on the bottom left  Medical Decision Making  Medically screening exam initiated at 2:13 AM.  Appropriate orders placed.  Kelton Pillar Silversmith was informed that the remainder of the evaluation will be completed by another provider, this initial triage assessment does not replace that evaluation, and the importance of remaining in the ED until their evaluation is complete.  Workup initiated in triage    Anselmo Pickler, [place] 11/14/22 [id number]

## 2022-11-14 NOTE — ED Notes (Signed)
voluntary 

## 2022-11-18 ENCOUNTER — Emergency Department (HOSPITAL_COMMUNITY): Payer: Medicaid Other

## 2022-11-18 ENCOUNTER — Other Ambulatory Visit: Payer: Self-pay

## 2022-11-18 ENCOUNTER — Emergency Department (HOSPITAL_COMMUNITY)
Admission: EM | Admit: 2022-11-18 | Discharge: 2022-11-19 | Disposition: A | Discharge status: Home / Self Care | Payer: Medicaid Other | Attending: Emergency Medicine | Admitting: Emergency Medicine

## 2022-11-18 DIAGNOSIS — R079 Chest pain, unspecified: Secondary | ICD-10-CM | POA: Diagnosis not present

## 2022-11-18 DIAGNOSIS — R000 Tachycardia, unspecified: Secondary | ICD-10-CM | POA: Insufficient documentation

## 2022-11-18 DIAGNOSIS — I509 Heart failure, unspecified: Secondary | ICD-10-CM | POA: Diagnosis not present

## 2022-11-18 LAB — CBC
HCT: 39.9 % (ref 39.0–52.0)
Hemoglobin: 13.4 g/dL (ref 13.0–17.0)
MCH: 30.9 pg (ref 26.0–34.0)
MCHC: 33.6 g/dL (ref 30.0–36.0)
MCV: 92.1 fL (ref 80.0–100.0)
Platelets: 299 10*3/uL (ref 150–400)
RBC: 4.33 MIL/uL (ref 4.22–5.81)
RDW: 14.2 % (ref 11.5–15.5)
WBC: 5.2 10*3/uL (ref 4.0–10.5)
nRBC: 0 % (ref 0.0–0.2)

## 2022-11-18 NOTE — ED Notes (Signed)
Patient left the triage room .
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
## 2022-11-18 NOTE — ED Provider Triage Note (Signed)
Emergency Medicine Provider Triage Evaluation Note  William Powers , a 42 y.o. male  was evaluated in triage.  Pt complains of chest pain. States that same began 2 days ago when his mother died. He states that his pain is located in the left side of his chest. He denies any associated shortness of breath, nausea, or vomiting. No cardiac hx.  Review of Systems  Positive:  Negative:   Physical Exam  BP 102/87   Pulse (!) 120   Temp 98.8 F (37.1 C) (Oral)   Resp 16   SpO2 97%  Gen:   Awake, no distress   Resp:  Normal effort  MSK:   Moves extremities without difficulty  Other:    Medical Decision Making  Medically screening exam initiated at 10:47 PM.  Appropriate orders placed.  William Powers was informed that the remainder of the evaluation will be completed by another provider, this initial triage assessment does not replace that evaluation, and the importance of remaining in the ED until their evaluation is complete.     Bud Face, PA-C 11/18/22 2249

## 2022-11-19 ENCOUNTER — Emergency Department (HOSPITAL_COMMUNITY): Payer: Medicaid Other

## 2022-11-19 LAB — RAPID URINE DRUG SCREEN, HOSP PERFORMED
Amphetamines: NOT DETECTED
Barbiturates: NOT DETECTED
Benzodiazepines: NOT DETECTED
Cocaine: NOT DETECTED
Opiates: NOT DETECTED
Tetrahydrocannabinol: NOT DETECTED

## 2022-11-19 LAB — COMPREHENSIVE METABOLIC PANEL
ALT: 44 U/L (ref 0–44)
AST: 43 U/L — ABNORMAL HIGH (ref 15–41)
Albumin: 3.5 g/dL (ref 3.5–5.0)
Alkaline Phosphatase: 57 U/L (ref 38–126)
Anion gap: 11 (ref 5–15)
BUN: 13 mg/dL (ref 6–20)
CO2: 20 mmol/L — ABNORMAL LOW (ref 22–32)
Calcium: 8.8 mg/dL — ABNORMAL LOW (ref 8.9–10.3)
Chloride: 105 mmol/L (ref 98–111)
Creatinine, Ser: 1.01 mg/dL (ref 0.61–1.24)
GFR, Estimated: >60 mL/min (ref >60)
Glucose, Bld: 96 mg/dL (ref 70–99)
Potassium: 3.2 mmol/L — ABNORMAL LOW (ref 3.5–5.1)
Sodium: 136 mmol/L (ref 135–145)
Total Bilirubin: 0.9 mg/dL (ref 0.3–1.2)
Total Protein: 8.5 g/dL — ABNORMAL HIGH (ref 6.5–8.1)

## 2022-11-19 LAB — TROPONIN I (HIGH SENSITIVITY)
Troponin I (High Sensitivity): 17 ng/L (ref <18)
Troponin I (High Sensitivity): 6 ng/L (ref <18)

## 2022-11-19 LAB — ETHANOL: Alcohol, Ethyl (B): 178 mg/dL — ABNORMAL HIGH (ref <10)

## 2022-11-19 MED ORDER — IOHEXOL 350 MG/ML SOLN
75.0000 mL | Freq: Once | INTRAVENOUS | Status: AC | PRN
  Administered 2022-11-19: 75 mL via INTRAVENOUS

## 2022-11-19 NOTE — ED Provider Notes (Signed)
[facility] Provider Note   CSN: [id number] Arrival date & time: 11/18/22  2213     History {Add pertinent medical, surgical, social history, OB history to HPI:1} Chief Complaint  Patient presents with   Chest Pain    William Powers is a 42 y.o. male.  Patient presents to the emergency department for evaluation of chest pain.  Patient reports that he has been under a lot of stress recently.  His mother died 2 days ago and he has been upset.  Patient reports that he has a history of congestive heart failure.  He has not noticed any significant swelling.  He is not short of breath.  He has been having intermittent pains in the chest.  He reports that he is stressed out.       Home Medications Prior to Admission medications   Medication Sig Start Date End Date Taking? Authorizing Provider  amoxicillin-clavulanate (AUGMENTIN) 875-125 MG tablet Take 1 tablet by mouth every 12 (twelve) hours. 11/10/22   Azucena Cecil, PA-C  Potassium Chloride ER 20 MEQ TBCR Take 20 mEq by mouth daily at 6 (six) AM. Patient not taking: No sig reported 08/07/21 09/05/21  Reubin Milan, MD      Allergies    Bee venom, Lisinopril, and Sulfamethoxazole-trimethoprim    Review of Systems   Review of Systems  Physical Exam Updated Vital Signs BP (!) 115/57   Pulse 96   Temp 98.9 F (37.2 C) (Oral)   Resp 18   SpO2 94%  Physical Exam Vitals and nursing note reviewed.  Constitutional:      General: He is not in acute distress.    Appearance: He is well-developed.  HENT:     Head: Normocephalic and atraumatic.     Mouth/Throat:     Mouth: Mucous membranes are moist.  Eyes:     General: Vision grossly intact. Gaze aligned appropriately.     Extraocular Movements: Extraocular movements intact.     Conjunctiva/sclera: Conjunctivae normal.  Cardiovascular:     Rate and Rhythm: Regular rhythm. Tachycardia present.     Pulses: Normal  pulses.     Heart sounds: Normal heart sounds, S1 normal and S2 normal. No murmur heard.    No friction rub. No gallop.  Pulmonary:     Effort: Pulmonary effort is normal. No respiratory distress.     Breath sounds: Normal breath sounds.  Abdominal:     Palpations: Abdomen is soft.     Tenderness: There is no abdominal tenderness. There is no guarding or rebound.     Hernia: No hernia is present.  Musculoskeletal:        General: No swelling.     Cervical back: Full passive range of motion without pain, normal range of motion and neck supple. No pain with movement, spinous process tenderness or muscular tenderness. Normal range of motion.     Right lower leg: No edema.     Left lower leg: No edema.  Skin:    General: Skin is warm and dry.     Capillary Refill: Capillary refill takes less than 2 seconds.     Findings: No ecchymosis, erythema, lesion or wound.  Neurological:     Mental Status: He is alert and oriented to person, place, and time.     GCS: GCS eye subscore is 4. GCS verbal subscore is 5. GCS motor subscore is 6.     Cranial Nerves: Cranial nerves 2-12  are intact.     Sensory: Sensation is intact.     Motor: Motor function is intact. No weakness or abnormal muscle tone.     Coordination: Coordination is intact.  Psychiatric:        Mood and Affect: Mood normal.        Speech: Speech normal.        Behavior: Behavior normal.     ED Results / Procedures / Treatments   Labs (all labs ordered are listed, but only abnormal results are displayed) Labs Reviewed  ETHANOL - Abnormal; Notable for the following components:      Result Value   Alcohol, Ethyl (B) 178 (*)    All other components within normal limits  COMPREHENSIVE METABOLIC PANEL - Abnormal; Notable for the following components:   Potassium 3.2 (*)    CO2 20 (*)    Calcium 8.8 (*)    Total Protein 8.5 (*)    AST 43 (*)    All other components within normal limits  CBC  RAPID URINE DRUG SCREEN, HOSP  PERFORMED  TROPONIN I (HIGH SENSITIVITY)  TROPONIN I (HIGH SENSITIVITY)    EKG None  Radiology CT Angio Chest Pulmonary Embolism (PE) W or WO Contrast  Result Date: 11/19/2022 CLINICAL DATA:  Chest pain EXAM: CT ANGIOGRAPHY CHEST WITH CONTRAST TECHNIQUE: Multidetector CT imaging of the chest was performed using the standard protocol during bolus administration of intravenous contrast. Multiplanar CT image reconstructions and MIPs were obtained to evaluate the vascular anatomy. RADIATION DOSE REDUCTION: This exam was performed according to the departmental dose-optimization program which includes automated exposure control, adjustment of the mA and/or kV according to patient size and/or use of iterative reconstruction technique. CONTRAST:  16mL OMNIPAQUE IOHEXOL 350 MG/ML SOLN COMPARISON:  08/21/2022 FINDINGS: Cardiovascular: Contrast injection is sufficient to demonstrate satisfactory opacification of the pulmonary arteries to the segmental level. There is no pulmonary embolus or evidence of right heart strain. The size of the main pulmonary artery is normal. Heart size is normal, with no pericardial effusion. The course and caliber of the aorta are normal. There is no atherosclerotic calcification. Opacification decreased due to pulmonary arterial phase contrast bolus timing. Mediastinum/Nodes: Unchanged mildly enlarged axillary lymph nodes. No mediastinal lymphadenopathy. Normal visualized thyroid. Thoracic esophageal course is normal. Lungs/Pleura: Airways are patent. No pleural effusion, lobar consolidation, pneumothorax or pulmonary infarction. Upper Abdomen: Contrast bolus timing is not optimized for evaluation of the abdominal organs. The visualized portions of the organs of the upper abdomen are normal. Musculoskeletal: No chest wall abnormality. No bony spinal canal stenosis. Review of the MIP images confirms the above findings. IMPRESSION: No pulmonary embolus or acute aortic syndrome.  Electronically Signed   By: Ulyses Jarred M.D.   On: 11/19/2022 02:54   DG Chest 2 View  Result Date: 11/18/2022 CLINICAL DATA:  Chest pain EXAM: CHEST - 2 VIEW COMPARISON:  Chest x-ray 08/05/2022 FINDINGS: The heart size and mediastinal contours are within normal limits. Both lungs are clear. The visualized skeletal structures are unremarkable. IMPRESSION: No active cardiopulmonary disease. Electronically Signed   By: Ronney Asters M.D.   On: 11/18/2022 23:13    Procedures Procedures  {Document cardiac monitor, telemetry assessment procedure when appropriate:1}  Medications Ordered in ED Medications  iohexol (OMNIPAQUE) 350 MG/ML injection 75 mL (75 mLs Intravenous Contrast Given 11/19/22 0246)    ED Course/ Medical Decision Making/ A&P   {   Click here for ABCD2, HEART and other calculatorsREFRESH Note before signing :1}  Medical Decision Making Amount and/or Complexity of Data Reviewed External Data Reviewed: labs, radiology, ECG and notes. Labs: ordered. Decision-making details documented in ED Course. Radiology: ordered and independent interpretation performed. Decision-making details documented in ED Course. ECG/medicine tests: ordered and independent interpretation performed. Decision-making details documented in ED Course.  Risk Prescription drug management.   Patient here with multiple problems.  Patient reports that he is primarily stressed out.  He was having pains in the chest earlier.  He reports that he often feels this when he is under a great deal of stress.  Patient noted to be tachycardic at arrival, no retention.  Oxygen saturations are normal.  He is afebrile.  He has not had URI symptoms.  Patient's troponins are not elevated.  EKG without change from prior.  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  With his tachycardia, PE was considered.  Tachycardia could also be secondary to his  discomfort as well as his anxiety and intoxication.  Drug screen negative.  CT angio performed.  No acute abnormality noted.  Patient now resting comfortably without complaints other than feeling suicidal.  This is a chronic finding.  Patient was allowed to sleep for the rest of the night and then rechecked this morning.  He reports that he is feeling better.  No active suicidal ideation or plan.  He is agreeable to discharge.  {Document critical care time when appropriate:1} {Document review of labs and clinical decision tools ie heart score, Chads2Vasc2 etc:1}  {Document your independent review of radiology images, and any outside records:1} {Document your discussion with family members, caretakers, and with consultants:1} {Document social determinants of health affecting pt's care:1} {Document your decision making why or why not admission, treatments were needed:1} Final Clinical Impression(s) / ED Diagnoses Final diagnoses:  None    Rx / DC Orders ED Discharge Orders     None

## 2022-11-19 NOTE — ED Notes (Signed)
Pt seen walking out of department with security

## 2022-11-19 NOTE — ED Notes (Signed)
Pt brought back to room and care resumed

## 2022-11-19 NOTE — ED Notes (Signed)
Pt told security that they were refusing treatment and wanting to leave. Advised security that we cannot keep the pt against their will. Pt seen leaving the department.

## 2022-11-19 NOTE — ED Notes (Signed)
Pt singing in room with eyes closed and this RN closed the door to the room. Pt then proceeded to get up and come to desk saying "where's that white bitch who closed my door? Send her in here." Once this RN walked up to pt to see what was happening, pt started using more profanities.   Security called  MD made aware

## 2022-11-19 NOTE — ED Notes (Signed)
Pt refused DC vital signs

## 2022-12-03 ENCOUNTER — Emergency Department (HOSPITAL_COMMUNITY): Payer: Medicaid Other

## 2022-12-03 ENCOUNTER — Other Ambulatory Visit: Payer: Self-pay

## 2022-12-03 ENCOUNTER — Emergency Department (HOSPITAL_COMMUNITY)
Admission: EM | Admit: 2022-12-03 | Discharge: 2022-12-03 | Disposition: A | Discharge status: Home / Self Care | Payer: Medicaid Other | Attending: Emergency Medicine | Admitting: Emergency Medicine

## 2022-12-03 ENCOUNTER — Encounter (HOSPITAL_COMMUNITY): Payer: Self-pay

## 2022-12-03 DIAGNOSIS — D72829 Elevated white blood cell count, unspecified: Secondary | ICD-10-CM | POA: Insufficient documentation

## 2022-12-03 DIAGNOSIS — I509 Heart failure, unspecified: Secondary | ICD-10-CM | POA: Diagnosis not present

## 2022-12-03 DIAGNOSIS — I110 Hypertensive heart disease with heart failure: Secondary | ICD-10-CM | POA: Insufficient documentation

## 2022-12-03 DIAGNOSIS — R0600 Dyspnea, unspecified: Secondary | ICD-10-CM | POA: Diagnosis not present

## 2022-12-03 DIAGNOSIS — R0602 Shortness of breath: Secondary | ICD-10-CM | POA: Diagnosis present

## 2022-12-03 DIAGNOSIS — D500 Iron deficiency anemia secondary to blood loss (chronic): Secondary | ICD-10-CM | POA: Diagnosis not present

## 2022-12-03 DIAGNOSIS — Z1152 Encounter for screening for COVID-19: Secondary | ICD-10-CM | POA: Diagnosis not present

## 2022-12-03 DIAGNOSIS — R000 Tachycardia, unspecified: Secondary | ICD-10-CM | POA: Diagnosis not present

## 2022-12-03 LAB — BASIC METABOLIC PANEL
Anion gap: 12 (ref 5–15)
BUN: 15 mg/dL (ref 6–20)
CO2: 20 mmol/L — ABNORMAL LOW (ref 22–32)
Calcium: 8.3 mg/dL — ABNORMAL LOW (ref 8.9–10.3)
Chloride: 105 mmol/L (ref 98–111)
Creatinine, Ser: 0.98 mg/dL (ref 0.61–1.24)
GFR, Estimated: >60 mL/min (ref >60)
Glucose, Bld: 90 mg/dL (ref 70–99)
Potassium: 3.3 mmol/L — ABNORMAL LOW (ref 3.5–5.1)
Sodium: 137 mmol/L (ref 135–145)

## 2022-12-03 LAB — CBC WITH DIFFERENTIAL/PLATELET
Abs Immature Granulocytes: 0.01 10*3/uL (ref 0.00–0.07)
Basophils Absolute: 0 10*3/uL (ref 0.0–0.1)
Basophils Relative: 1 %
Eosinophils Absolute: 0 10*3/uL (ref 0.0–0.5)
Eosinophils Relative: 1 %
HCT: 39 % (ref 39.0–52.0)
Hemoglobin: 12.9 g/dL — ABNORMAL LOW (ref 13.0–17.0)
Immature Granulocytes: 0 %
Lymphocytes Relative: 35 %
Lymphs Abs: 1 10*3/uL (ref 0.7–4.0)
MCH: 30.6 pg (ref 26.0–34.0)
MCHC: 33.1 g/dL (ref 30.0–36.0)
MCV: 92.6 fL (ref 80.0–100.0)
Monocytes Absolute: 0.3 10*3/uL (ref 0.1–1.0)
Monocytes Relative: 11 %
Neutro Abs: 1.5 10*3/uL — ABNORMAL LOW (ref 1.7–7.7)
Neutrophils Relative %: 52 %
Platelets: 283 10*3/uL (ref 150–400)
RBC: 4.21 MIL/uL — ABNORMAL LOW (ref 4.22–5.81)
RDW: 14.3 % (ref 11.5–15.5)
WBC: 3 10*3/uL — ABNORMAL LOW (ref 4.0–10.5)
nRBC: 0 % (ref 0.0–0.2)

## 2022-12-03 LAB — RESP PANEL BY RT-PCR (RSV, FLU A&B, COVID)  RVPGX2
Influenza A by PCR: NEGATIVE
Influenza B by PCR: NEGATIVE
Resp Syncytial Virus by PCR: NEGATIVE
SARS Coronavirus 2 by RT PCR: NEGATIVE

## 2022-12-03 LAB — D-DIMER, QUANTITATIVE: D-Dimer, Quant: 0.3 ug/mL-FEU (ref 0.00–0.50)

## 2022-12-03 LAB — TROPONIN I (HIGH SENSITIVITY)
Troponin I (High Sensitivity): 6 ng/L (ref <18)
Troponin I (High Sensitivity): 6 ng/L (ref <18)

## 2022-12-03 NOTE — ED Notes (Addendum)
Patient states he became short of breath after walking hour. Patient then states he needs to go to the behavioral [facility] but will not state why. Patient endorses drink vodka. Patient denies SI/HI, but states he has had these thoughts in the past. Currently the patient is laying in bed, resting, on room air.

## 2022-12-03 NOTE — ED Provider Notes (Signed)
Accepted handoff at shift change from [facility]. Please see prior provider note for more detail.   Briefly: Patient is 42 y.o. presenting to ED with complaint of shortness of breath, mainly with exertion.  No chest pain or pleuritic chest pain.  History significant for PE, CHF, HIV, polysubstance dependency, HTN.   DDX: concern for ACS, CHF, PE  Plan: Waiting for 2nd troponin to result.  If negative, patient is appropriate for discharge home with outpatient follow-up.     Results for orders placed or performed during the hospital encounter of 12/03/22  Resp panel by RT-PCR (RSV, Flu A&B, Covid) Anterior Nasal Swab   Specimen: Anterior Nasal Swab  Result Value Ref Range   SARS Coronavirus 2 by RT PCR NEGATIVE NEGATIVE   Influenza A by PCR NEGATIVE NEGATIVE   Influenza B by PCR NEGATIVE NEGATIVE   Resp Syncytial Virus by PCR NEGATIVE NEGATIVE  Basic metabolic panel  Result Value Ref Range   Sodium 137 135 - 145 mmol/L   Potassium 3.3 (L) 3.5 - 5.1 mmol/L   Chloride 105 98 - 111 mmol/L   CO2 20 (L) 22 - 32 mmol/L   Glucose, Bld 90 70 - 99 mg/dL   BUN 15 6 - 20 mg/dL   Creatinine, Ser 0.98 0.61 - 1.24 mg/dL   Calcium 8.3 (L) 8.9 - 10.3 mg/dL   GFR, Estimated >60 >60 mL/min   Anion gap 12 5 - 15  CBC with Differential  Result Value Ref Range   WBC 3.0 (L) 4.0 - 10.5 K/uL   RBC 4.21 (L) 4.22 - 5.81 MIL/uL   Hemoglobin 12.9 (L) 13.0 - 17.0 g/dL   HCT 39.0 39.0 - 52.0 %   MCV 92.6 80.0 - 100.0 fL   MCH 30.6 26.0 - 34.0 pg   MCHC 33.1 30.0 - 36.0 g/dL   RDW 14.3 11.5 - 15.5 %   Platelets 283 150 - 400 K/uL   nRBC 0.0 0.0 - 0.2 %   Neutrophils Relative % 52 %   Neutro Abs 1.5 (L) 1.7 - 7.7 K/uL   Lymphocytes Relative 35 %   Lymphs Abs 1.0 0.7 - 4.0 K/uL   Monocytes Relative 11 %   Monocytes Absolute 0.3 0.1 - 1.0 K/uL   Eosinophils Relative 1 %   Eosinophils Absolute 0.0 0.0 - 0.5 K/uL   Basophils Relative 1 %   Basophils Absolute 0.0 0.0 - 0.1 K/uL   Immature  Granulocytes 0 %   Abs Immature Granulocytes 0.01 0.00 - 0.07 K/uL  D-dimer, quantitative  Result Value Ref Range   D-Dimer, Quant 0.30 0.00 - 0.50 ug/mL-FEU  Troponin I (High Sensitivity)  Result Value Ref Range   Troponin I (High Sensitivity) 6 <18 ng/L   DG Chest 2 View  Result Date: 12/03/2022 CLINICAL DATA:  Shortness of breath that began several hours ago. EXAM: CHEST - 2 VIEW COMPARISON:  11/18/2018 for FINDINGS: Heart size and mediastinal contours are unremarkable. There is no pleural fluid or edema. No airspace disease identified. Visualized osseous structures are unremarkable. IMPRESSION: No active cardiopulmonary disease. Electronically Signed   By: Kerby Moors M.D.   On: 12/03/2022 06:08   CT Angio Chest Pulmonary Embolism (PE) W or WO Contrast  Result Date: 11/19/2022 CLINICAL DATA:  Chest pain EXAM: CT ANGIOGRAPHY CHEST WITH CONTRAST TECHNIQUE: Multidetector CT imaging of the chest was performed using the standard protocol during bolus administration of intravenous contrast. Multiplanar CT image reconstructions and MIPs were obtained to evaluate  the vascular anatomy. RADIATION DOSE REDUCTION: This exam was performed according to the departmental dose-optimization program which includes automated exposure control, adjustment of the mA and/or kV according to patient size and/or use of iterative reconstruction technique. CONTRAST:  25m OMNIPAQUE IOHEXOL 350 MG/ML SOLN COMPARISON:  08/21/2022 FINDINGS: Cardiovascular: Contrast injection is sufficient to demonstrate satisfactory opacification of the pulmonary arteries to the segmental level. There is no pulmonary embolus or evidence of right heart strain. The size of the main pulmonary artery is normal. Heart size is normal, with no pericardial effusion. The course and caliber of the aorta are normal. There is no atherosclerotic calcification. Opacification decreased due to pulmonary arterial phase contrast bolus timing.  Mediastinum/Nodes: Unchanged mildly enlarged axillary lymph nodes. No mediastinal lymphadenopathy. Normal visualized thyroid. Thoracic esophageal course is normal. Lungs/Pleura: Airways are patent. No pleural effusion, lobar consolidation, pneumothorax or pulmonary infarction. Upper Abdomen: Contrast bolus timing is not optimized for evaluation of the abdominal organs. The visualized portions of the organs of the upper abdomen are normal. Musculoskeletal: No chest wall abnormality. No bony spinal canal stenosis. Review of the MIP images confirms the above findings. IMPRESSION: No pulmonary embolus or acute aortic syndrome. Electronically Signed   By: KUlyses JarredM.D.   On: 11/19/2022 02:54   DG Chest 2 View  Result Date: 11/18/2022 CLINICAL DATA:  Chest pain EXAM: CHEST - 2 VIEW COMPARISON:  Chest x-ray 08/05/2022 FINDINGS: The heart size and mediastinal contours are within normal limits. Both lungs are clear. The visualized skeletal structures are unremarkable. IMPRESSION: No active cardiopulmonary disease. Electronically Signed   By: ARonney AstersM.D.   On: 11/18/2022 23:13    Physical Exam  BP 116/87   Pulse (!) 110   Resp 17   Ht 6' 4"$  (1.93 m)   Wt 113 kg   SpO2 99%   BMI 30.32 kg/m   Physical Exam Vitals and nursing note reviewed.  Constitutional:      General: He is not in acute distress.    Appearance: Normal appearance. He is not ill-appearing or diaphoretic.  Cardiovascular:     Rate and Rhythm: Normal rate and regular rhythm.  Pulmonary:     Effort: Pulmonary effort is normal.  Neurological:     Mental Status: He is alert. Mental status is at baseline.  Psychiatric:        Mood and Affect: Mood normal.        Behavior: Behavior normal.     Procedures  Procedures  ED Course / MDM    Medical Decision Making Amount and/or Complexity of Data Reviewed Labs: ordered. Radiology: ordered.   Delta troponin for patient was 0.  Patient still has not produced a urine for  a UDS, however, this does not change plan for discharge.  Will discharge patient home without completing urine.  All other labs have been resulted.  Patient was resting comfortably.    The patient has been appropriately medically screened and/or stabilized in the ED. I have low suspicion for any other emergent medical condition which would require further screening, evaluation or treatment in the ED or require inpatient management. At time of discharge the patient is hemodynamically stable and in no acute distress. I have discussed work-up results and diagnosis with patient and answered all questions. Patient is agreeable with discharge plan. We discussed strict return precautions for returning to the emergency department and they verbalized understanding.          CTheressa StampsRYpsilanti PUtah02/14/24 [id number]  Valarie Merino, MD 12/03/22 [phone]

## 2022-12-03 NOTE — ED Notes (Signed)
Asked pt again about getting a urine sample pt said he did not have to go

## 2022-12-03 NOTE — ED Provider Notes (Signed)
[place] AT [facility] Provider Note   CSN: [id number] Arrival date & time: 12/03/22  0404     History  Chief Complaint  Patient presents with   Shortness of Breath    William Powers is a 42 y.o. male.  HPI   Medical history including PE currently not anticoagulated, CHF with ejection fraction of 55 to 60%, HIV, polysubstance dependency, hypertension presenting with complaints of shortness of breath, states that started about 8 hours ago, states he will get intermittent shortness of breath, he states is mainly with exertion, he has no associated pleuritic chest pain, no chest pain itself, denies bloody sputum, denies any worsening leg swelling calf tenderness, no recent surgeries no long immobilizations.  States that he has never had this before, he is concerned for his congestive heart failure, he denies any cough congestion general body aches.  I reviewed patient's chart has been seen multiple times for similar presentation, was most recently seen 2 days ago at [place], for chest pain, patient had a benign workup and discharged home.  He was seen on the 31st for chest pain where CTA of chest was performed and ruled PE result was negative.  Home Medications Prior to Admission medications   Medication Sig Start Date End Date Taking? Authorizing Provider  amoxicillin-clavulanate (AUGMENTIN) 875-125 MG tablet Take 1 tablet by mouth every 12 (twelve) hours. 11/10/22   Azucena Cecil, PA-C  Potassium Chloride ER 20 MEQ TBCR Take 20 mEq by mouth daily at 6 (six) AM. Patient not taking: No sig reported 08/07/21 09/05/21  Reubin Milan, MD      Allergies    Bee venom, Lisinopril, and Sulfamethoxazole-trimethoprim    Review of Systems   Review of Systems  Constitutional:  Negative for chills and fever.  Respiratory:  Positive for shortness of breath.   Cardiovascular:  Negative for chest pain.  Gastrointestinal:  Negative for abdominal  pain.  Neurological:  Negative for headaches.    Physical Exam Updated Vital Signs BP 116/87   Pulse (!) 110   Resp 17   Ht 6' 4"$  (1.93 m)   Wt 113 kg   SpO2 99%   BMI 30.32 kg/m  Physical Exam Vitals and nursing note reviewed.  Constitutional:      General: He is not in acute distress.    Appearance: He is not ill-appearing.  HENT:     Head: Normocephalic and atraumatic.     Nose: No congestion.  Eyes:     Conjunctiva/sclera: Conjunctivae normal.  Cardiovascular:     Rate and Rhythm: Regular rhythm. Tachycardia present.     Pulses: Normal pulses.     Heart sounds: No murmur heard.    No friction rub. No gallop.  Pulmonary:     Effort: No respiratory distress.     Breath sounds: No wheezing, rhonchi or rales.  Musculoskeletal:     Right lower leg: No edema.     Left lower leg: No edema.     Comments: No unilateral leg swelling no calf tenderness no palpable cords  Skin:    General: Skin is warm and dry.  Neurological:     Mental Status: He is alert.  Psychiatric:        Mood and Affect: Mood normal.     ED Results / Procedures / Treatments   Labs (all labs ordered are listed, but only abnormal results are displayed) Labs Reviewed  BASIC METABOLIC PANEL - Abnormal; Notable for  the following components:      Result Value   Potassium 3.3 (*)    CO2 20 (*)    Calcium 8.3 (*)    All other components within normal limits  CBC WITH DIFFERENTIAL/PLATELET - Abnormal; Notable for the following components:   WBC 3.0 (*)    RBC 4.21 (*)    Hemoglobin 12.9 (*)    Neutro Abs 1.5 (*)    All other components within normal limits  RESP PANEL BY RT-PCR (RSV, FLU A&B, COVID)  [id number]  D-DIMER, QUANTITATIVE  RAPID URINE DRUG SCREEN, HOSP PERFORMED  TROPONIN I (HIGH SENSITIVITY)  TROPONIN I (HIGH SENSITIVITY)    EKG EKG Interpretation  Date/Time:  Wednesday December 03 2022 04:49:29 EST Ventricular Rate:  104 PR Interval:  158 QRS Duration: 102 QT  Interval:  359 QTC Calculation: 473 R Axis:   56 Text Interpretation: Sinus tachycardia Borderline T abnormalities, inferior leads No significant change was found Confirmed by Addison Lank [phone]) on 12/03/2022 4:58:30 AM  Radiology DG Chest 2 View  Result Date: 12/03/2022 CLINICAL DATA:  Shortness of breath that began several hours ago. EXAM: CHEST - 2 VIEW COMPARISON:  11/18/2018 for FINDINGS: Heart size and mediastinal contours are unremarkable. There is no pleural fluid or edema. No airspace disease identified. Visualized osseous structures are unremarkable. IMPRESSION: No active cardiopulmonary disease. Electronically Signed   By: Kerby Moors M.D.   On: 12/03/2022 06:08    Procedures Procedures    Medications Ordered in ED Medications - No data to display  ED Course/ Medical Decision Making/ A&P                             Medical Decision Making Amount and/or Complexity of Data Reviewed Labs: ordered. Radiology: ordered.   This patient presents to the ED for concern of shortness of breath, this involves an extensive number of treatment options, and is a complaint that carries with it a high risk of complications and morbidity.  The differential diagnosis includes PE, ACS, CHF, pneumonia    Additional history obtained:  Additional history obtained from N/A External records from outside source obtained and reviewed including recent ER notes, cardiology notes   Co morbidities that complicate the patient evaluation  PE, CHF  Social Determinants of Health:  Polysubstance dependency    Lab Tests:  I Ordered, and personally interpreted labs.  The pertinent results include: CBC shows stable leukocytosis of 3, normocytic anemia hemoglobin 12.9, BMP shows potassium 3.3 CO2 of 20 calcium 8.3, for troponin is 6, respiratory panel is negative, D-dimer is negative   Imaging Studies ordered:  I ordered imaging studies including chest x-ray I independently visualized  and interpreted imaging which showed negative acute findings I agree with the radiologist interpretation   Cardiac Monitoring:  The patient was maintained on a cardiac monitor.  I personally viewed and interpreted the cardiac monitored which showed an underlying rhythm of: Sinus tach without signs of ischemia   Medicines ordered and prescription drug management:  I ordered medication including N/A I have reviewed the patients home medicines and have made adjustments as needed  Critical Interventions:  N/A   Reevaluation:  Presents with shortness of breath, will obtain lab work imaging and reassess.  Patient was reassessed, he is found asleep, no acute distress vital signs have improved no longer tachycardic    Consultations Obtained:  N/A    Test Considered:  N/A    Rule  out Heart score of 3, I have low suspicion for ACS as history is atypical, EKG was  without signs of ischemia, first troponin is negative, second troponin is pending. I doubt CHF exacerbation as he is not volume overloaded my exam, no rales present, chest x-ray negative for pleural effusions.  Low suspicion for PE as patient denies pleuritic chest pain, patient denies leg pain, no pedal edema noted on exam, D-dimer is negative.  Low suspicion for AAA or aortic dissection as history is atypical, patient has low risk factors.  Low suspicion for systemic infection as patient is nontoxic-appearing, vital signs reassuring, no obvious source infection noted on exam.     Dispostion and problem list  Due to shift change patient may handoff to [facility]  Follow-up on second troponin unremarkable patient can be discharged home follow-up with PCP as needed strict return precautions.            Final Clinical Impression(s) / ED Diagnoses Final diagnoses:  Dyspnea, unspecified type    Rx / DC Orders ED Discharge Orders     None         Marcello Fennel, PA-C 12/03/22 0640     Fatima Blank, MD 12/03/22 [phone]

## 2022-12-03 NOTE — ED Triage Notes (Signed)
Patient arrives EMS for shortness of breath. Patient ambulated to the room, no distress noted, on room air. Patient stated he was out walking when shortness of breath started.

## 2022-12-03 NOTE — Discharge Instructions (Addendum)
You work-up today was overall reassuring.  Recommend following up with your primary care provider regarding your shortness of breath.  If you do not have a primary care provider, please call the [facility] number provided to set an appointment up with one.   Return to the ED if you have worsening of your symptoms or if you have any new concerns.

## 2022-12-03 NOTE — ED Notes (Signed)
Advised pt we need a urine sample, urinal is placed by bedside

## 2022-12-05 DIAGNOSIS — Z5901 Sheltered homelessness: Secondary | ICD-10-CM | POA: Insufficient documentation

## 2022-12-05 DIAGNOSIS — Z1152 Encounter for screening for COVID-19: Secondary | ICD-10-CM | POA: Insufficient documentation

## 2022-12-05 DIAGNOSIS — Z79899 Other long term (current) drug therapy: Secondary | ICD-10-CM | POA: Insufficient documentation

## 2022-12-05 DIAGNOSIS — F1990 Other psychoactive substance use, unspecified, uncomplicated: Secondary | ICD-10-CM | POA: Insufficient documentation

## 2022-12-05 DIAGNOSIS — R45851 Suicidal ideations: Secondary | ICD-10-CM | POA: Insufficient documentation

## 2022-12-06 ENCOUNTER — Ambulatory Visit (HOSPITAL_COMMUNITY)
Admission: EM | Admit: 2022-12-06 | Discharge: 2022-12-06 | Disposition: A | Discharge status: Home / Self Care | Payer: No Typology Code available for payment source | Attending: Urology | Admitting: Urology

## 2022-12-06 DIAGNOSIS — Z1152 Encounter for screening for COVID-19: Secondary | ICD-10-CM | POA: Diagnosis not present

## 2022-12-06 DIAGNOSIS — R45851 Suicidal ideations: Secondary | ICD-10-CM | POA: Diagnosis not present

## 2022-12-06 DIAGNOSIS — F1990 Other psychoactive substance use, unspecified, uncomplicated: Secondary | ICD-10-CM

## 2022-12-06 DIAGNOSIS — Z5901 Sheltered homelessness: Secondary | ICD-10-CM | POA: Diagnosis not present

## 2022-12-06 LAB — ETHANOL: Alcohol, Ethyl (B): 258 mg/dL — ABNORMAL HIGH (ref <10)

## 2022-12-06 LAB — COMPREHENSIVE METABOLIC PANEL
ALT: 42 U/L (ref 0–44)
AST: 45 U/L — ABNORMAL HIGH (ref 15–41)
Albumin: 3.5 g/dL (ref 3.5–5.0)
Alkaline Phosphatase: 59 U/L (ref 38–126)
Anion gap: 13 (ref 5–15)
BUN: 13 mg/dL (ref 6–20)
CO2: 22 mmol/L (ref 22–32)
Calcium: 8.9 mg/dL (ref 8.9–10.3)
Chloride: 104 mmol/L (ref 98–111)
Creatinine, Ser: 1.18 mg/dL (ref 0.61–1.24)
GFR, Estimated: >60 mL/min (ref >60)
Glucose, Bld: 93 mg/dL (ref 70–99)
Potassium: 3.4 mmol/L — ABNORMAL LOW (ref 3.5–5.1)
Sodium: 139 mmol/L (ref 135–145)
Total Bilirubin: 0.7 mg/dL (ref 0.3–1.2)
Total Protein: 8.2 g/dL — ABNORMAL HIGH (ref 6.5–8.1)

## 2022-12-06 LAB — CBC WITH DIFFERENTIAL/PLATELET
Abs Immature Granulocytes: 0.01 10*3/uL (ref 0.00–0.07)
Basophils Absolute: 0 10*3/uL (ref 0.0–0.1)
Basophils Relative: 0 %
Eosinophils Absolute: 0.1 10*3/uL (ref 0.0–0.5)
Eosinophils Relative: 2 %
HCT: 39.4 % (ref 39.0–52.0)
Hemoglobin: 13.5 g/dL (ref 13.0–17.0)
Immature Granulocytes: 0 %
Lymphocytes Relative: 42 %
Lymphs Abs: 2 10*3/uL (ref 0.7–4.0)
MCH: 31.1 pg (ref 26.0–34.0)
MCHC: 34.3 g/dL (ref 30.0–36.0)
MCV: 90.8 fL (ref 80.0–100.0)
Monocytes Absolute: 0.4 10*3/uL (ref 0.1–1.0)
Monocytes Relative: 9 %
Neutro Abs: 2.3 10*3/uL (ref 1.7–7.7)
Neutrophils Relative %: 47 %
Platelets: 285 10*3/uL (ref 150–400)
RBC: 4.34 MIL/uL (ref 4.22–5.81)
RDW: 14.1 % (ref 11.5–15.5)
WBC: 4.8 10*3/uL (ref 4.0–10.5)
nRBC: 0 % (ref 0.0–0.2)

## 2022-12-06 LAB — LIPID PANEL
Cholesterol: 147 mg/dL (ref 0–200)
HDL: 54 mg/dL (ref >40)
LDL Cholesterol: 50 mg/dL (ref 0–99)
Total CHOL/HDL Ratio: 2.7 RATIO
Triglycerides: 216 mg/dL — ABNORMAL HIGH (ref <150)
VLDL: 43 mg/dL — ABNORMAL HIGH (ref 0–40)

## 2022-12-06 LAB — HEMOGLOBIN A1C
Hgb A1c MFr Bld: 4.9 % (ref 4.8–5.6)
Mean Plasma Glucose: 93.93 mg/dL

## 2022-12-06 LAB — RESP PANEL BY RT-PCR (RSV, FLU A&B, COVID)  RVPGX2
Influenza A by PCR: NEGATIVE
Influenza B by PCR: NEGATIVE
Resp Syncytial Virus by PCR: NEGATIVE
SARS Coronavirus 2 by RT PCR: NEGATIVE

## 2022-12-06 LAB — TSH: TSH: 0.841 u[IU]/mL (ref 0.350–4.500)

## 2022-12-06 LAB — POC SARS CORONAVIRUS 2 AG: SARSCOV2ONAVIRUS 2 AG: NEGATIVE

## 2022-12-06 MED ORDER — THIAMINE HCL 100 MG/ML IJ SOLN
100.0000 mg | Freq: Once | INTRAMUSCULAR | Status: AC
  Administered 2022-12-06: 100 mg via INTRAMUSCULAR
  Filled 2022-12-06: qty 2

## 2022-12-06 MED ORDER — HYDROXYZINE HCL 25 MG PO TABS: 25.0000 mg | ORAL_TABLET | Freq: Four times a day (QID) | ORAL | Status: DC | PRN

## 2022-12-06 MED ORDER — LORAZEPAM 1 MG PO TABS: 1.0000 mg | ORAL_TABLET | Freq: Four times a day (QID) | ORAL | Status: DC | PRN

## 2022-12-06 MED ORDER — LORAZEPAM 1 MG PO TABS
2.0000 mg | ORAL_TABLET | Freq: Once | ORAL | Status: AC
  Administered 2022-12-06: 2 mg via ORAL
  Filled 2022-12-06: qty 2

## 2022-12-06 MED ORDER — LOPERAMIDE HCL 2 MG PO CAPS: 2.0000 mg | ORAL_CAPSULE | ORAL | Status: DC | PRN

## 2022-12-06 MED ORDER — ADULT MULTIVITAMIN W/MINERALS CH
1.0000 | ORAL_TABLET | Freq: Every day | ORAL | Status: DC
  Administered 2022-12-06: 1 via ORAL
  Filled 2022-12-06: qty 1

## 2022-12-06 MED ORDER — ALUM & MAG HYDROXIDE-SIMETH 200-200-20 MG/5ML PO SUSP: 30.0000 mL | ORAL | Status: DC | PRN

## 2022-12-06 MED ORDER — THIAMINE MONONITRATE 100 MG PO TABS: 100.0000 mg | ORAL_TABLET | Freq: Every day | ORAL | Status: DC

## 2022-12-06 MED ORDER — MAGNESIUM HYDROXIDE 400 MG/5ML PO SUSP: 30.0000 mL | Freq: Every day | ORAL | Status: DC | PRN

## 2022-12-06 MED ORDER — ONDANSETRON 4 MG PO TBDP: 4.0000 mg | ORAL_TABLET | Freq: Four times a day (QID) | ORAL | Status: DC | PRN

## 2022-12-06 MED ORDER — TRAZODONE HCL 50 MG PO TABS: 50.0000 mg | ORAL_TABLET | Freq: Every evening | ORAL | Status: DC | PRN

## 2022-12-06 MED ORDER — ACETAMINOPHEN 325 MG PO TABS: 650.0000 mg | ORAL_TABLET | Freq: Four times a day (QID) | ORAL | Status: DC | PRN

## 2022-12-06 NOTE — ED Notes (Signed)
Pt asleep in bed. Respirations even and unlabored. Monitoring for safety. 

## 2022-12-06 NOTE — ED Notes (Addendum)
Pt admitted to obs for passive SI/HI and substance use. Pt denies AVH. Pt A&O x4, appears to be intoxicated. Pt presents as silly and makes jokes with staff about drug use and sexual references. Pt tolerated lab work and skin assessment well. Pt unable to provide urine specimen for UDS at this time. Pt ambulated with assistance of staff to unit as his gait is unsteady. Pt oriented to unit/staff and provided with a warm meal and juice. No signs of acute distress noted. Monitoring for safety.

## 2022-12-06 NOTE — BH Assessment (Signed)
Comprehensive Clinical Assessment (CCA) Note  12/06/2022 William Powers [id number]   Disposition: Per Leandro Reasoner, NP admission to continuous assessment at [facility] is recommended for further monitoring with AM reassessment by psychiatry to determine the most appropriate disposition plan.    The patient demonstrates the following risk factors for suicide: Chronic risk factors for suicide include: substance use disorder. Acute risk factors for suicide include: social withdrawal/isolation and loss (financial, interpersonal, professional). Protective factors for this patient include: positive social support, coping skills, hope for the future, and life satisfaction. Considering these factors, the overall suicide risk at this point appears to be low. Patient is appropriate for outpatient follow up once stabilized.   Patient is a 42 year old male with a history of polysubstance abuse who presents voluntarily to [facility] for assessment.  Patient presents voluntarily, highly intoxicated admitting to ETOH , meth, cocaine and fentanyl use tonight.  He is quite animated, as he lays down on the floor laughing and sliding around the lobby floor.  MHT is unable to get vitals, as he is unable to stay still for reading.  Patient continues to discuss various drugs he likes and asks staff if they like various substances.  He informed patient access staff that he is looking for a wife.  Unable to obtain pertinent information for assessment due to patient's altered/intoxicated mental state.  He mostly presents as silly, lying on the floor joking inappropriately with staff.  He is oriented x4, however limited orientation to situation.  He states he has been living in hotels, however this is challenging as he only receives $950 disability income.  Patient continuously asks for "help" however he is unable to identify the treatment he is seeking.  He endorses SI, however is unable to provide relevant history  regarding triggers, hx.  He also does not identify any plan or intent.  Patient endorses HI towards a man at the shelter, however he denies plan or intent to harm anyone.     Chief Complaint: intoxication, seeking "help"  Visit Diagnosis: Alcohol Use Disorder, moderate                             Opioid Use Disorder, moderate                             Other stimulant Use Disorder, amphetamine type, moderate    CCA Screening, Triage and Referral (STR)  Patient Reported Information How did you hear about [place]? Self  What Is the Reason for Your Visit/Call Today? Patient presents voluntarily, highly intoxicated repeating "I'm fucked up."  He is quite animated, as he lays down on the floor laughing and sliding around the lobby floor.  MHT is unable to get vitals, as he is unable to stay still for reading.  Patient admits to drinking and states he likes cocaine.  Unable to complete traige assessment due to his current altered mental status.  How Long Has This Been Causing You Problems? 1 wk - 1 month  What Do You Feel Would Help You the Most Today? Alcohol or Drug Use Treatment   Have You Recently Had Any Thoughts About Hurting Yourself? Yes (endorses SI, while laughing and discussing his love for fentanyl and cocaine.)  Are You Planning to Commit Suicide/Harm Yourself At This time? No (UTA, due to intoxication and AMS)   [place] ED from 12/06/2022 in  [facility] ED from 12/03/2022 in [facility] Emergency Department at [facility] ED from 11/18/2022 in [facility] Emergency Department at [place] No Risk No Risk       Have you Recently Had Thoughts About [place]? No (UTA, due to intoxication and AMS)  Are You Planning to Harm Someone at This Time? No (UTA, due to intoxication and AMS)  Explanation: UTA, due to intoxication and AMS   Have You Used Any Alcohol or Drugs in the Past 24  Hours? Yes  What Did You Use and How Much? unable to answer questions appropriately   Do You Currently Have a Therapist/Psychiatrist? No  Name of Therapist/Psychiatrist: Name of Therapist/Psychiatrist: N/A   Have You Been Recently Discharged From Any Office Practice or Programs? No  Explanation of Discharge From Practice/Program: N/A     CCA Screening Triage Referral Assessment Type of Contact: Face-to-Face  Telemedicine Service Delivery:   Is this Initial or Reassessment?   Date Telepsych consult ordered in [facility]:  Date Telepsych consult ordered in [facility]: 12/06/22  Time Telepsych consult ordered in [facility]:  Time Telepsych consult ordered in [facility]: 0032  Location of Assessment: [facility] Services  Provider Location: [facility] Assessment Services   Collateral Involvement: None   Does Patient Have a [place]? No  Legal Guardian Contact Information: N/A  Copy of Legal Guardianship Form: -- (N/A)  Legal Guardian Notified of Arrival: -- (N/A)  Legal Guardian Notified of Pending Discharge: -- (N/A)  If Minor and Not Living with Parent(s), Who has Custody? N/A  Is CPS involved or ever been involved? Never  Is APS involved or ever been involved? Never   Patient Determined To Be At Risk for Harm To Self or Others Based on Review of Patient Reported Information or Presenting Complaint? No  Method: No Plan  Availability of Means: No access or NA  Intent: Vague intent or NA  Notification Required: No need or identified person  Additional Information for Danger to Others Potential: -- (N/A)  Additional Comments for Danger to Others Potential: N/A  Are There Guns or Other Weapons in Your Home? No  Types of Guns/Weapons: N/A  Are These Weapons Safely Secured?                            -- (N/A)  Who Could Verify You Are Able To Have These Secured: N/A  Do You Have any Outstanding Charges, Pending Court Dates, Parole/Probation? denies current  charges  Contacted To Inform of Risk of Harm To Self or Others: -- (N/A)    Does Patient Present under Involuntary Commitment? No    [place] of Residence: [place]   Patient Currently Receiving the Following Services: Not Receiving Services   Determination of Need: Urgent (48 hours)   Options For Referral: [facility]     CCA Biopsychosocial Patient Reported Schizophrenia/Schizoaffective Diagnosis in Past: No   Strengths: Patient requesting detox   Mental Health Symptoms Depression:   Hopelessness   Duration of Depressive symptoms:  Duration of Depressive Symptoms: Less than two weeks   Mania:   None   Anxiety:    None   Psychosis:   None   Duration of Psychotic symptoms:    Trauma:   None   Obsessions:   None   Compulsions:   None   Inattention:   N/A  Hyperactivity/Impulsivity:   N/A   Oppositional/Defiant Behaviors:   N/A   Emotional Irregularity:   None   Other Mood/Personality Symptoms:   N/A    Mental Status Exam Appearance and self-care  Stature:   Tall   Weight:   Thin   Clothing:   -- (Hospital scrubs)   Grooming:   Normal   Cosmetic use:   None   Posture/gait:   Normal   Motor activity:   Not Remarkable   Sensorium  Attention:   Normal   Concentration:   Normal   Orientation:   X5   Recall/memory:   Normal   Affect and Mood  Affect:   Labile   Mood:   Euphoric   Relating  Eye contact:   Normal   Facial expression:   Responsive   Attitude toward examiner:   Cooperative; Copy and Language  Speech flow:  Normal   Thought content:   Appropriate to Mood and Circumstances   Preoccupation:   None   Hallucinations:   None   Organization:   Irrelevant; Loose (due to current intoxication)   Transport planner of Knowledge:   Average   Intelligence:   Average   Abstraction:   Normal   Judgement:   Impaired   Reality Testing:   Realistic    Insight:   Gaps   Decision Making:   Normal; Vacilates   Social Functioning  Social Maturity:   Isolates   Social Judgement:   "Street Smart"   Stress  Stressors:   Other (Comment) ("Life")   Coping Ability:   Deficient supports   Skill Deficits:   None   Supports:   -- (states he has a girlfriend, then states he left, unclear)     Religion: Religion/Spirituality Are You A Religious Person?: No How Might This Affect Treatment?: N/A  Leisure/Recreation: Leisure / Recreation Do You Have Hobbies?: No  Exercise/Diet: Exercise/Diet Do You Exercise?: No Have You Gained or Lost A Significant Amount of Weight in the Past Six Months?: No Do You Follow a Special Diet?: No Do You Have Any Trouble Sleeping?: Yes Explanation of Sleeping Difficulties: UTA due to current level of intoxication/impairment   CCA Employment/Education Employment/Work Situation: Employment / Work Situation Employment Situation: On disability Why is Patient on Disability: Due to heart failure How Long has Patient Been on Disability: Since age 60 Patient's Job has Been Impacted by Current Illness: No Has Patient ever Been in the [employer]?: No  Education: Education Is Patient Currently Attending School?: No Last Grade Completed: 12 Did You Attend College?: No Did You Have An Individualized Education Program (IIEP): No Did You Have Any Difficulty At School?: No Patient's Education Has Been Impacted by Current Illness: No   CCA Family/Childhood History Family and Relationship History: Family history Marital status: Single Does patient have children?: No  Childhood History:  Childhood History By whom was/is the patient raised?: Mother Did patient suffer any verbal/emotional/physical/sexual abuse as a child?: No Did patient suffer from severe childhood neglect?: No Has patient ever been sexually abused/assaulted/raped as an adolescent or adult?: No Was the patient ever a victim of a  crime or a disaster?: No Witnessed domestic violence?: No Has patient been affected by domestic violence as an adult?: No       CCA Substance Use Alcohol/Drug Use: Alcohol / Drug Use Pain Medications: See MAR Prescriptions: See MAR Over the Counter: See MAR History of alcohol / drug use?: Yes Longest period of  sobriety (when/how long): 1 week Negative Consequences of Use: Personal relationships, Financial Withdrawal Symptoms: None Substance #1 Name of Substance 1: Alcohol 1 - Age of First Use: UTA d/t current level of intoxication 1 - Amount (size/oz): varies 1 - Frequency: UTA d/t current level of intoxication 1 - Duration: UTA d/t current level of intoxication 1 - Last Use / Amount: today - amt unknown 1 - Method of Aquiring: purchase- given to him 1- Route of Use: oral                       ASAM's:  Six Dimensions of Multidimensional Assessment  Dimension 1:  Acute Intoxication and/or Withdrawal Potential:   Dimension 1:  Description of individual's past and current experiences of substance use and withdrawal: Patient reports experiencing tremors at times  Dimension 2:  Biomedical Conditions and Complications:   Dimension 2:  Description of patient's biomedical conditions and  complications: Able to cope with physicial discomfort  Dimension 3:  Emotional, Behavioral, or Cognitive Conditions and Complications:  Dimension 3:  Description of emotional, behavioral, or cognitive conditions and complications: Endorses SI, howver d/t impairment this will be reassessed once patient is sober  Dimension 4:  Readiness to Change:  Dimension 4:  Description of Readiness to Change criteria: Patient states he wants to detox  Dimension 5:  Relapse, Continued use, or Continued Problem Potential:  Dimension 5:  Relapse, continued use, or continued problem potential critiera description: Patient has a history of continued substance use  Dimension 6:  Recovery/Living Environment:   Dimension 6:  Recovery/Iiving environment criteria description: Patient is currently homeless  ASAM Severity Score: ASAM's Severity Rating Score: 6  ASAM Recommended Level of Treatment: ASAM Recommended Level of Treatment: Level III Residential Treatment   Substance use Disorder (SUD) Substance Use Disorder (SUD)  Checklist Symptoms of Substance Use: Persistent desire or unsuccessful efforts to cut down or control use, Presence of craving or strong urge to use, Continued use despite persistent or recurrent social, interpersonal problems, caused or exacerbated by use, Recurrent use that results in a failure to fulfill major role obligations (work, school, home)  Recommendations for Services/Supports/Treatments: Recommendations for Services/Supports/Treatments Recommendations For Services/Supports/Treatments: IOP (Intensive Outpatient Program), Detox, Facility Based Crisis  Discharge Disposition:    DSM5 Diagnoses: Patient Active Problem List   Diagnosis Date Noted   Dental caries 09/13/2022   Substance abuse ([place]) 05/03/2022   Polysubstance abuse ([place]) 04/29/2022   Alcohol use disorder, severe, dependence ([place]) 03/16/2022   Recurrent major depression-severe ([place]) 02/23/2022   Alcohol abuse    Vitamin B1 deficiency 09/19/2021   Adjustment disorder with depressed mood    History of CHF (congestive heart failure) 08/07/2021   H/O ETOH abuse 08/07/2021   Chronic HFrEF (heart failure with reduced ejection fraction) ([place]) 07/23/2021   HIV (human immunodeficiency virus infection) ([place]) 07/23/2021   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

## 2022-12-06 NOTE — Discharge Instructions (Addendum)
[facility]  [facility] [place], [place], [address] [phone] phone  Offers food and emergency or transitional housing to men, women, or families in need. Clients participate in programs and workshops developed to promote self-sufficiency and personal development.Call or walk in. Applications are accepted Monday, Wednesday, and Friday by appointment only. Need photo ID and proof of income.  [facility] [address] [place], [place] [address] [phone] Population served: Adult men & women (50 years old and older, able to perform activities for daily living) Documents required: Valid ID & [place] [address] [place], [place]  [address] [phone] Population served: Families with children  [facility] [address], [place], [place]  [address] [phone] Population served: Single women 18+ without dependents Documents required:  [place]-R[place] Card  [facility] [place] [address] [place], [place]  [address] [phone] Population served: Male veterans 18+ with substance abuse/mental health issues Eligibility: By referral only  [facility] [address], [place], [place] [address] [phone] Population served: Males 18+ Documents required: Valid ID & Social Security Card  Room at [employer] [place]. [address], [place] [address] [phone] or [phone] Population served: Pregnant women with or without children  Documents required: Valid ID & Social Security Banker [employer] [address], [place], [place] [address] [phone] Population Served: Families with children  [facility] [address] [place], [place] [address] [phone] Population served: Men 18+, preference for disabled and/or  veterans Eligibility: By referral only  [place] [facility] T. [facility] P[facility] [place]) - [facility] [place], [place], [place] [address] [phone] or [phone] Population served: Families with children.    WOMEN ONLY  The Shelter serves up to 20 women each night. Open from December 11th through the end of March in the evenings from 5:30 pm until 7:30 am, the Shelter provides a hot evening meal, shower and sleeping facilities, and food for the next day. Secure parking is available beside the building.     Shelter Address   Directions [facility] [place]! [place], [place]  If you are in need of housing through the shelter, contact the [employer] at [phone].    Homeless Shelter List:     Regulatory affairs officer ([place])  [place], [place]  Phone: [phone]     [facility]  [address] [place], [place] [address]  Phone: [phone]     [facility] (Women only)  [address] [place], [place] [address]  Phone: [place] [address]. [place], [place] [address]  Phone: [phone]     [place]:  [phone]. [address]  [place], [place] [address]  Phone: [phone]     [facility]  [address] [place], [place] [address]  (Check in at 6:00PM for placement at a local shelter)  Phone: [phone]    [facility] RESIDENTIAL [place] (women only at this campus) [place],  [address] [phone]  [facility] [place] (men only at  this campus) [place],  [address] [phone]  ((These programs listed above have a one-time application fee.))   [facility] [place]. [place], [place], [address] Men's  Program - Roderic Ovens [place] - [id number] Women's Program - Delma Freeze [phone] Intake Coordinator - [phone]  (81-monthprogram offering Hep C treatment to anyone who reaches the 960-monthark in their program.  Free of cost.  No controlled substances allowed for medications.  No heavy anti-psychotics like the ones that treat schizophrenia or Seroquel.  Transportation for pick-up is available.)   DeChesapeake Energy[address] Lake RoadNC [phone](DeStonegates not for everyone.  They have stringent acceptance criteria.  No pending legal issues, no psychotropic medications, and zero history of SI/HI/AVH. This is a free program, but it is a high end program.)  DuOpa-lockaa275 North Cactus StreetNC [phone] Bibber [phone] phone  (This is a halfway house for WOMarthasville18+.  Residential is 12 months, and non-residential is 6-12 months depending on progress.)  MaBurman Nievesnd ErCendant Corporation[address][phone] phone [phone]  (WCommunity Memorial Hospitalalfway house, 9-12 months)  MaBartonsvillerSewickley HeightsNC [phone] PortalNC [phone] Gi LLCRehab for men only) [address]tClintonNC [phone] RENew Kent HoVa [facility] Healthcare System - Jamaica PlainiThe [phone]'s DiCollege [phone] DETOX/RESIDENTIAL TREATMENT- MEDICAID/IPRS:  ARCA [id number] WiGeorgetownNC [id number]vThorpNC [id number] d'Alene1Grover HillAsLennonNC [phone] Glastonbury[address]StPort ElizabethNC [phone] RosetCharlotteNC [phone]((For admissions to these three DaMedical Plaza Ambulatory Surgery Center Associates LPacilities during weekday days and possibly other times, contact ChJordan Hillphone: [phone]: [phone]  DIBronx-Lebanon Hospital Center - Con[facility] DivisionRIShubert217607 Sunnysl[address] S[phone] phone  InBradyf Greater [place] [address] Licka[address]iIndependenceNCAlaska2823762Gastonia, [phone] phone [phone]  (Ae175 [address] Manchester LaneBCBuckleyCiHodgesMost Medicare and Medicaid, UHLake HeritageUninsured/IPRS)  Residential Treatment Services (detox for men and women) 13OsakisNC [phone] DETOX AND RESIDENTIAL TREATMENT - INSURANCE:  Fellowship HaNevada Cranealso offers CD-IOP for graduates of residential program) 51FrankclayGrWest PointNC [phone] Center of GaWhitesboronow accepting ViAlaska11Mentor-on-the-LakeVA [phone]. Lukes Des Peres Hospitalaccept FeSutter Coast Hospitalor some services) [address] Amher[address] StreetWiFruit HeightsNC [phone] PROGRAMS:  [facility] (ADS) 11MowerNC [phone]((CD-IOP is currently not operational; Opioid replacement clinic is operational))   CoLittle Ferry Clinict GrChatham Hospital, Inc.private insurance) [address]DoyleElBlack [address]St[address].rNew CastleNC [phone]((CD-IOP (afternoon program); individual therapy))   GuTopawaMeMissionor GuGrace Medical Centeresidents) 93BrooksideNC [phone]((CD-IOP; new clients must go through walk-in clinic))   Insight HuFinancial controllerMedicaid & IPRS for  [facility] residents) [place], [place] [address] [phone]  ((CD-IOP))   [facility] [place] (Medicaid for  [facility] and [employer]; some private insurance) [address] [place], [place] [address] [phone]  ((CD-IOP))   [facility] ([place] insurance) [phone] [address] [employer] [address]. [place], [place] [address] [phone]  ((CD-IOP (morning & evening programs); individual therapy))   HALFWAY HOUSES:  Friends of Bill [phone]  [employer].[email or web address]   OPIOID REPLACEMENT PROVIDERS:  [facility] (ADS) [place], [place] [address] [phone]  [place] [address], [place] [address] [phone]  [facility] [address] [place],  [address] [phone]   12 STEP PROGRAMS:  Alcoholics Anonymous of [place] ReportZoo.com.cy  Narcotics Anonymous of Grace GreenScrapbooking.dk  Al-Anon of [place], [place] [email or web address]/find-meetings.html  Nar-Anon https://nar-anon.org/find-a-meetin    SUBSTANCE USE TREATMENT for Medicaid/Medicare and State Funded/IPRS  [facility] (ADS) [place], [place], [address] [phone] phone NOTE: ADS is no longer offering IOP services.  Serves those who are low-income or have no insurance.  [facility] [address], [place], [place], [address] [phone] phone [phone] fax NOTE: Does have Substance Abuse-Intensive Outpatient Program [facility]) as well as transitional housing if eligible.  [place], [place], [address] [phone] phone [phone] fax  [place] [phone] [address] [place], [place], [address] [phone] phone [phone] fax   The suicide prevention education provided includes the following: Suicide risk factors Suicide prevention and interventions National Suicide Hotline telephone number [facility]  assessment telephone number [facility] Emergency Assistance [place] and/or Residential Mobile Crisis Unit telephone number

## 2022-12-06 NOTE — ED Provider Notes (Cosign Needed Addendum)
[facility] Continuous Assessment Admission H&P  Date: 12/06/22 Patient Name: William Powers MRN: [id number] Chief Complaint: " I need help"  Diagnoses:  Final diagnoses:  Substance use disorder    HPI: William Powers is a 42 year old male with history of depression, suicidal ideation, and polysubstance abuse (alcohol, methamphetamine, cocaine, amphetamine, and fentanyl).  Patient presented voluntarily to [facility] for a walk-in assessment.  Patient was evaluated face-to-face and his chart was reviewed by this nurse petitioner.  On assessment, patient is lying on the floor in the assessment room, patient is noted to be intoxicated. He repeatedly says "I need help, I like crack cocaine." He endorses drinking 1 bottle of vodka; using an unknown amount of methamphetamine, crack cocaine, and fentanyl tonight. He is alert and oriented X4 however he is unable to participate and provide appropriate answer to assessment question. He is intoxicated, laughing, and joking inappropriately. Patient reports that he is currently experiencing housing insecurity; he says he lives in hotel and at times he rents a room from "a man." He says is he unemployed and on disability due to heart failure. Endorses passive suicidal ideation, no plan or intent. He also endorses homicidal ideation towards a man at the shelter, he says he wants to shoot the man. He refused to say if he has access to a weapon/gun. He denies hallucination and paranoia.   Alcohol blood level is 258,UDS is pending collection   Patient was corporative with assessment however he was unable to provided pertinent assessment information due to intoxication. He had good eye contact, he's speech was clear with increased volume. He mood is euphoric, affect is congruent. No signs that he is responding to any internal/external stimuli.    Total Time spent with patient: 20 minutes  Musculoskeletal  Strength & Muscle Tone: within normal limits Gait  & Station: normal Patient leans: Right  Psychiatric Specialty Exam  Presentation General Appearance:  Casual  Eye Contact: Good  Speech: Normal Rate (loud)  Speech Volume: Normal  Handedness: Right   Mood and Affect  Mood: Irritable  Affect: Congruent   Thought Process  Thought Processes: Goal Directed  Descriptions of Associations:Loose  Orientation:Full (Time, Place and Person)  Thought Content:Logical  Diagnosis of Schizophrenia or Schizoaffective disorder in past: No   Hallucinations:Hallucinations: None  Ideas of Reference:None  Suicidal Thoughts:Suicidal Thoughts: No  Homicidal Thoughts:Homicidal Thoughts: No   Sensorium  Memory: Immediate Fair; Recent Poor; Remote Poor  Judgment: Poor  Insight: Fair   Community education officer  Concentration: Poor  Attention Span: Poor  Recall: Poor  Fund of Knowledge: Poor  Language: Fair   Psychomotor Activity  Psychomotor Activity: Psychomotor Activity: Normal   Assets  Assets: Desire for Improvement   Sleep  Sleep: Sleep: Fair Number of Hours of Sleep: 5   Nutritional Assessment (For OBS and FBC admissions only) Has the patient had a weight loss or gain of 10 pounds or more in the last 3 months?: No Has the patient had a decrease in food intake/or appetite?: No Does the patient have dental problems?: No Does the patient have eating habits or behaviors that may be indicators of an eating disorder including binging or inducing vomiting?: No Has the patient recently lost weight without trying?: 0 Has the patient been eating poorly because of a decreased appetite?: 0 Malnutrition Screening Tool Score: 0    Physical Exam Vitals and nursing note reviewed.  Constitutional:      General: He is not in acute distress.  Appearance: He is well-developed.  HENT:     Head: Normocephalic and atraumatic.  Eyes:     Conjunctiva/sclera: Conjunctivae normal.  Cardiovascular:      Rate and Rhythm: Normal rate.  Pulmonary:     Effort: Pulmonary effort is normal. No respiratory distress.     Breath sounds: Normal breath sounds.  Abdominal:     Palpations: Abdomen is soft.     Tenderness: There is no abdominal tenderness.  Musculoskeletal:        General: Swelling present.     Cervical back: Normal range of motion.     Right lower leg: Edema present.     Left lower leg: Edema present.  Skin:    General: Skin is warm and dry.     Capillary Refill: Capillary refill takes 2 to 3 seconds.  Neurological:     Mental Status: He is alert and oriented to person, place, and time.  Psychiatric:        Attention and Perception: Attention and perception normal. He does not perceive auditory or visual hallucinations.        Mood and Affect: Mood is elated.        Speech: Noncommunicative: loud, increased volume.        Behavior: Behavior is cooperative.        Thought Content: Thought content includes homicidal and suicidal ideation. Thought content includes homicidal plan. Thought content does not include suicidal plan.    Review of Systems  Constitutional: Negative.   HENT: Negative.    Eyes: Negative.   Respiratory: Negative.    Cardiovascular:  Positive for leg swelling.  Gastrointestinal: Negative.   Genitourinary: Negative.   Musculoskeletal: Negative.   Skin: Negative.   Neurological: Negative.   Endo/Heme/Allergies: Negative.   Psychiatric/Behavioral:  Positive for substance abuse and suicidal ideas. The patient is nervous/anxious.     Blood pressure 109/65, pulse 97, temperature 97.7 F (36.5 C), temperature source Oral, resp. rate 18, SpO2 100 %. There is no height or weight on file to calculate BMI.  Past Psychiatric History:  depression, suicidal ideation, and polysubstance abuse (alcohol, methamphetamine, cocaine, amphetamine, and fentanyl).  Is the patient at risk to self? Yes  Has the patient been a risk to self in the past 6 months? Yes .    Has  the patient been a risk to self within the distant past? Yes   Is the patient a risk to others? Yes   Has the patient been a risk to others in the past 6 months? No   Has the patient been a risk to others within the distant past? No   Past Medical History: PE, CHF, HIV, polysubstance abuse, HTN.   Family History:  Family History  Problem Relation Age of Onset   Lupus Mother    HIV Mother    HIV Father      Social History:  Social History   Tobacco Use   Smoking status: Some Days    Packs/day: 0.50    Types: Cigarettes, Cigars   Smokeless tobacco: Never  Vaping Use   Vaping Use: Never used  Substance Use Topics   Alcohol use: Yes    Comment: 10 pints per week   Drug use: Yes    Types: Marijuana, Cocaine     Last Labs:  Admission on 12/06/2022  Component Date Value Ref Range Status   SARS Coronavirus 2 by RT PCR 12/06/2022 NEGATIVE  NEGATIVE Final   Influenza A by PCR 12/06/2022 NEGATIVE  NEGATIVE Final   Influenza B by PCR 12/06/2022 NEGATIVE  NEGATIVE Final   Comment: (NOTE) The Xpert Xpress SARS-CoV-2/FLU/RSV plus assay is intended as an aid in the diagnosis of influenza from Nasopharyngeal swab specimens and should not be used as a sole basis for treatment. Nasal washings and aspirates are unacceptable for Xpert Xpress SARS-CoV-2/FLU/RSV testing.  Fact Sheet for Patients: EntrepreneurPulse.com.au  Fact Sheet for Healthcare Providers: IncredibleEmployment.be  This test is not yet approved or cleared by the [place] FDA and has been authorized for detection and/or diagnosis of SARS-CoV-2 by FDA under an Emergency Use Authorization (EUA). This EUA will remain in effect (meaning this test can be used) for the duration of the COVID-19 declaration under Section 564(b)(1) of the Act, 21 U.S.C. section 360bbb-3(b)(1), unless the authorization is terminated or revoked.     Resp Syncytial Virus by PCR 12/06/2022 NEGATIVE   NEGATIVE Final   Comment: (NOTE) Fact Sheet for Patients: EntrepreneurPulse.com.au  Fact Sheet for Healthcare Providers: IncredibleEmployment.be  This test is not yet approved or cleared by the [place] FDA and has been authorized for detection and/or diagnosis of SARS-CoV-2 by FDA under an Emergency Use Authorization (EUA). This EUA will remain in effect (meaning this test can be used) for the duration of the COVID-19 declaration under Section 564(b)(1) of the Act, 21 U.S.C. section 360bbb-3(b)(1), unless the authorization is terminated or revoked.  Performed at [facility], [place] [address] [place], [place] [address]    WBC 12/06/2022 4.8  4.0 - 10.5 K/uL Final   RBC 12/06/2022 4.34  4.22 - 5.81 MIL/uL Final   Hemoglobin 12/06/2022 13.5  13.0 - 17.0 g/dL Final   HCT 12/06/2022 39.4  39.0 - 52.0 % Final   MCV 12/06/2022 90.8  80.0 - 100.0 fL Final   MCH 12/06/2022 31.1  26.0 - 34.0 pg Final   MCHC 12/06/2022 34.3  30.0 - 36.0 g/dL Final   RDW 12/06/2022 14.1  11.5 - 15.5 % Final   Platelets 12/06/2022 285  150 - 400 K/uL Final   nRBC 12/06/2022 0.0  0.0 - 0.2 % Final   Neutrophils Relative % 12/06/2022 47  % Final   Neutro Abs 12/06/2022 2.3  1.7 - 7.7 K/uL Final   Lymphocytes Relative 12/06/2022 42  % Final   Lymphs Abs 12/06/2022 2.0  0.7 - 4.0 K/uL Final   Monocytes Relative 12/06/2022 9  % Final   Monocytes Absolute 12/06/2022 0.4  0.1 - 1.0 K/uL Final   Eosinophils Relative 12/06/2022 2  % Final   Eosinophils Absolute 12/06/2022 0.1  0.0 - 0.5 K/uL Final   Basophils Relative 12/06/2022 0  % Final   Basophils Absolute 12/06/2022 0.0  0.0 - 0.1 K/uL Final   Immature Granulocytes 12/06/2022 0  % Final   Abs Immature Granulocytes 12/06/2022 0.01  0.00 - 0.07 K/uL Final   Performed at [facility], [place] [address], [place], [place] [address]   Sodium 12/06/2022 139  135 - 145 mmol/L Final   Potassium 12/06/2022 3.4 (L)   3.5 - 5.1 mmol/L Final   Chloride 12/06/2022 104  98 - 111 mmol/L Final   CO2 12/06/2022 22  22 - 32 mmol/L Final   Glucose, Bld 12/06/2022 93  70 - 99 mg/dL Final   Glucose reference range applies only to samples taken after fasting for at least 8 hours.   BUN 12/06/2022 13  6 - 20 mg/dL Final   Creatinine, Ser 12/06/2022 1.18  0.61 - 1.24 mg/dL Final   Calcium 12/06/2022 8.9  8.9 - 10.3 mg/dL Final   Total Protein 12/06/2022 8.2 (H)  6.5 - 8.1 g/dL Final   Albumin 12/06/2022 3.5  3.5 - 5.0 g/dL Final   AST 12/06/2022 45 (H)  15 - 41 U/L Final   ALT 12/06/2022 42  0 - 44 U/L Final   Alkaline Phosphatase 12/06/2022 59  38 - 126 U/L Final   Total Bilirubin 12/06/2022 0.7  0.3 - 1.2 mg/dL Final   GFR, Estimated 12/06/2022 >60  >60 mL/min Final   Comment: (NOTE) Calculated using the CKD-EPI Creatinine Equation (2021)    Anion gap 12/06/2022 13  5 - 15 Final   Performed at [place] [address], [place], [place] [address]   Hgb A1c MFr Bld 12/06/2022 4.9  4.8 - 5.6 % Final   Comment: (NOTE) Pre diabetes:          5.7%-6.4%  Diabetes:              >6.4%  Glycemic control for   <7.0% adults with diabetes    Mean Plasma Glucose 12/06/2022 93.93  mg/dL Final   Performed at [facility], [place] [address], [place], [place] [address]   Alcohol, Ethyl (B) 12/06/2022 258 (H)  <10 mg/dL Final   Comment: (NOTE) Lowest detectable limit for serum alcohol is 10 mg/dL.  For medical purposes only. Performed at [facility], [place] [address], [place], [place] [address]    Cholesterol 12/06/2022 147  0 - 200 mg/dL Final   Triglycerides 12/06/2022 216 (H)  <150 mg/dL Final   HDL 12/06/2022 54  >40 mg/dL Final   Total CHOL/HDL Ratio 12/06/2022 2.7  RATIO Final   VLDL 12/06/2022 43 (H)  0 - 40 mg/dL Final   LDL Cholesterol 12/06/2022 50  0 - 99 mg/dL Final   Comment:        Total Cholesterol/HDL:CHD Risk Coronary Heart Disease Risk Table                     Men   Women   1/2 Average Risk   3.4   3.3  Average Risk       5.0   4.4  2 X Average Risk   9.6   7.1  3 X Average Risk  23.4   11.0        Use the calculated Patient Ratio above and the CHD Risk Table to determine the patient's CHD Risk.        ATP III CLASSIFICATION (LDL):  <100     mg/dL   Optimal  100-129  mg/dL   Near or Above                    Optimal  130-159  mg/dL   Borderline  160-189  mg/dL   High  >190     mg/dL   Very High Performed at [place] [address], [place], [place] [address]    TSH 12/06/2022 0.841  0.350 - 4.500 uIU/mL Final   Comment: Performed by a 3rd Generation assay with a functional sensitivity of <=0.01 uIU/mL. Performed at [facility], [place] [address] [place],  [address]    SARSCOV2ONAVIRUS 2 AG 12/06/2022 NEGATIVE  NEGATIVE Final   Comment: (NOTE) SARS-CoV-2 antigen NOT DETECTED.   Negative results are presumptive.  Negative results do not preclude SARS-CoV-2 infection and should  not be used as the sole basis for treatment or other patient management decisions, including infection  control decisions, particularly in the presence of clinical signs and  symptoms consistent with COVID-19, or in those who have been in contact with the virus.  Negative results must be combined with clinical observations, patient history, and epidemiological information. The expected result is Negative.  Fact Sheet for Patients: HandmadeRecipes.com.cy  Fact Sheet for Healthcare Providers: FuneralLife.at  This test is not yet approved or cleared by the [place] FDA and  has been authorized for detection and/or diagnosis of SARS-CoV-2 by FDA under an Emergency Use Authorization (EUA).  This EUA will remain in effect (meaning this test can be used) for the duration of  the COV                          ID-19 declaration under Section 564(b)(1) of the Act, 21 U.S.C. section 360bbb-3(b)(1), unless the  authorization is terminated or revoked sooner.    Admission on 12/03/2022, Discharged on 12/03/2022  Component Date Value Ref Range Status   SARS Coronavirus 2 by RT PCR 12/03/2022 NEGATIVE  NEGATIVE Final   Comment: (NOTE) SARS-CoV-2 target nucleic acids are NOT DETECTED.  The SARS-CoV-2 RNA is generally detectable in upper respiratory specimens during the acute phase of infection. The lowest concentration of SARS-CoV-2 viral copies this assay can detect is 138 copies/mL. A negative result does not preclude SARS-Cov-2 infection and should not be used as the sole basis for treatment or other patient management decisions. A negative result may occur with  improper specimen collection/handling, submission of specimen other than nasopharyngeal swab, presence of viral mutation(s) within the areas targeted by this assay, and inadequate number of viral copies(<138 copies/mL). A negative result must be combined with clinical observations, patient history, and epidemiological information. The expected result is Negative.  Fact Sheet for Patients:  EntrepreneurPulse.com.au  Fact Sheet for Healthcare Providers:  IncredibleEmployment.be  This test is no                          t yet approved or cleared by the [place] FDA and  has been authorized for detection and/or diagnosis of SARS-CoV-2 by FDA under an Emergency Use Authorization (EUA). This EUA will remain  in effect (meaning this test can be used) for the duration of the COVID-19 declaration under Section 564(b)(1) of the Act, 21 U.S.C.section 360bbb-3(b)(1), unless the authorization is terminated  or revoked sooner.       Influenza A by PCR 12/03/2022 NEGATIVE  NEGATIVE Final   Influenza B by PCR 12/03/2022 NEGATIVE  NEGATIVE Final   Comment: (NOTE) The Xpert Xpress SARS-CoV-2/FLU/RSV plus assay is intended as an aid in the diagnosis of influenza from Nasopharyngeal swab specimens  and should not be used as a sole basis for treatment. Nasal washings and aspirates are unacceptable for Xpert Xpress SARS-CoV-2/FLU/RSV testing.  Fact Sheet for Patients: EntrepreneurPulse.com.au  Fact Sheet for Healthcare Providers: IncredibleEmployment.be  This test is not yet approved or cleared by the [place] FDA and has been authorized for detection and/or diagnosis of SARS-CoV-2 by FDA under an Emergency Use Authorization (EUA). This EUA will remain in effect (meaning this test can be used) for the duration of the COVID-19 declaration under Section 564(b)(1) of the Act, 21 U.S.C. section 360bbb-3(b)(1), unless the authorization is terminated or revoked.     Resp Syncytial Virus by PCR 12/03/2022 NEGATIVE  NEGATIVE Final   Comment: (NOTE) Fact Sheet for Patients: EntrepreneurPulse.com.au  Fact Sheet for Healthcare Providers: IncredibleEmployment.be  This test is not yet approved or cleared by the [place] FDA and has been authorized for detection and/or diagnosis of SARS-CoV-2 by FDA under an Emergency Use Authorization (EUA). This EUA will remain in effect (meaning this test can be used) for the duration of the COVID-19 declaration under Section 564(b)(1) of the Act, 21 U.S.C. section 360bbb-3(b)(1), unless the authorization is terminated or revoked.  Performed at [facility], [place] [address] [place], [place] [address]    Sodium 12/03/2022 137  135 - 145 mmol/L Final   Potassium 12/03/2022 3.3 (L)  3.5 - 5.1 mmol/L Final   Chloride 12/03/2022 105  98 - 111 mmol/L Final   CO2 12/03/2022 20 (L)  22 - 32 mmol/L Final   Glucose, Bld 12/03/2022 90  70 - 99 mg/dL Final   Glucose reference range applies only to samples taken after fasting for at least 8 hours.   BUN 12/03/2022 15  6 - 20 mg/dL Final   Creatinine, Ser 12/03/2022 0.98  0.61 - 1.24 mg/dL Final   Calcium  12/03/2022 8.3 (L)  8.9 - 10.3 mg/dL Final   GFR, Estimated 12/03/2022 >60  >60 mL/min Final   Comment: (NOTE) Calculated using the CKD-EPI Creatinine Equation (2021)    Anion gap 12/03/2022 12  5 - 15 Final   Performed at [facility], [place] [address], [place], [place] [address]   WBC 12/03/2022 3.0 (L)  4.0 - 10.5 K/uL Final   RBC 12/03/2022 4.21 (L)  4.22 - 5.81 MIL/uL Final   Hemoglobin 12/03/2022 12.9 (L)  13.0 - 17.0 g/dL Final   HCT 12/03/2022 39.0  39.0 - 52.0 % Final   MCV 12/03/2022 92.6  80.0 - 100.0 fL Final   MCH 12/03/2022 30.6  26.0 - 34.0 pg Final   MCHC 12/03/2022 33.1  30.0 - 36.0 g/dL Final   RDW 12/03/2022 14.3  11.5 - 15.5 % Final   Platelets 12/03/2022 283  150 - 400 K/uL Final   nRBC 12/03/2022 0.0  0.0 - 0.2 % Final   Neutrophils Relative % 12/03/2022 52  % Final   Neutro Abs 12/03/2022 1.5 (L)  1.7 - 7.7 K/uL Final   Lymphocytes Relative 12/03/2022 35  % Final   Lymphs Abs 12/03/2022 1.0  0.7 - 4.0 K/uL Final   Monocytes Relative 12/03/2022 11  % Final   Monocytes Absolute 12/03/2022 0.3  0.1 - 1.0 K/uL Final   Eosinophils Relative 12/03/2022 1  % Final   Eosinophils Absolute 12/03/2022 0.0  0.0 - 0.5 K/uL Final   Basophils Relative 12/03/2022 1  % Final   Basophils Absolute 12/03/2022 0.0  0.0 - 0.1 K/uL Final   Immature Granulocytes 12/03/2022 0  % Final   Abs Immature Granulocytes 12/03/2022 0.01  0.00 - 0.07 K/uL Final   Performed at [facility], [place] [address], [place], [place] [address]   Troponin I (High Sensitivity) 12/03/2022 6  <18 ng/L Final   Comment: (NOTE) Elevated high sensitivity troponin I (hsTnI) values and significant  changes across serial measurements may suggest ACS but many other  chronic and acute conditions are known to elevate hsTnI results.  Refer to the "Links" section for chest pain algorithms and additional  guidance. Performed at [facility], [place] [address], [place],  [address]    D-Dimer, America Brown 12/03/2022 0.30  0.00 - 0.50 ug/mL-FEU Final   Comment: (NOTE) At the manufacturer cut-off value of 0.5 g/mL FEU, this assay has a negative predictive value of 95-100%.This assay is intended for use in conjunction with a clinical pretest probability (PTP) assessment model to exclude pulmonary embolism (PE) and deep venous thrombosis (DVT) in outpatients suspected of PE or DVT. Results should be correlated with clinical presentation. Performed at [facility], [place] [address] [place], [place] [address]    Troponin I (High Sensitivity) 12/03/2022 6  <18 ng/L Final   Comment: (NOTE) Elevated high sensitivity troponin I (hsTnI) values and significant  changes across serial measurements may suggest ACS but many other  chronic and acute conditions are known to elevate hsTnI results.  Refer to the "Links" section for chest pain algorithms and additional  guidance. Performed at [facility], [place] [address], [place], [place] [address]   Admission on 11/18/2022, Discharged on 11/19/2022  Component Date Value Ref Range Status   WBC 11/18/2022 5.2  4.0 - 10.5 K/uL Final   RBC 11/18/2022 4.33  4.22 - 5.81 MIL/uL Final   Hemoglobin 11/18/2022 13.4  13.0 - 17.0 g/dL Final   HCT 11/18/2022 39.9  39.0 - 52.0 % Final   MCV 11/18/2022 92.1  80.0 - 100.0 fL Final   MCH 11/18/2022 30.9  26.0 - 34.0 pg Final   MCHC 11/18/2022 33.6  30.0 - 36.0 g/dL Final   RDW 11/18/2022 14.2  11.5 - 15.5 % Final   Platelets 11/18/2022 299  150 - 400 K/uL Final   nRBC 11/18/2022 0.0  0.0 - 0.2 % Final   Performed at [facility], [place] [address], [place], [place] [address]   Troponin I (High Sensitivity) 11/18/2022 6  <18 ng/L Final   Comment: (NOTE) Elevated high sensitivity troponin I (hsTnI) values and significant  changes across serial measurements may suggest ACS but many other  chronic and acute conditions are known to  elevate hsTnI results.  Refer to the "Links" section for chest pain algorithms and additional  guidance. Performed at [facility], [place] [address], [place], [place] [address]    Alcohol, Ethyl (B) 11/18/2022 178 (H)  <10 mg/dL Final   Comment: (NOTE) Lowest detectable limit for serum alcohol is 10 mg/dL.  For medical purposes only. Performed at [facility], [place] [address] [place], [place] [address]    Opiates 11/19/2022 NONE DETECTED  NONE DETECTED Final   Cocaine 11/19/2022 NONE DETECTED  NONE DETECTED Final   Benzodiazepines 11/19/2022 NONE DETECTED  NONE DETECTED Final   Amphetamines 11/19/2022 NONE DETECTED  NONE DETECTED Final   Tetrahydrocannabinol 11/19/2022 NONE DETECTED  NONE DETECTED Final   Barbiturates 11/19/2022 NONE DETECTED  NONE DETECTED Final   Comment: (NOTE) DRUG SCREEN FOR MEDICAL PURPOSES ONLY.  IF CONFIRMATION IS NEEDED FOR ANY PURPOSE, NOTIFY LAB WITHIN 5 DAYS.  LOWEST DETECTABLE LIMITS FOR URINE DRUG SCREEN Drug Class                     Cutoff (ng/mL) Amphetamine and metabolites    1000 Barbiturate and metabolites    200 Benzodiazepine                 200 Opiates and metabolites        300 Cocaine and metabolites        300 THC  50 Performed at [facility], [place] [address] [place], [place] [address]    Sodium 11/18/2022 136  135 - 145 mmol/L Final   Potassium 11/18/2022 3.2 (L)  3.5 - 5.1 mmol/L Final   Chloride 11/18/2022 105  98 - 111 mmol/L Final   CO2 11/18/2022 20 (L)  22 - 32 mmol/L Final   Glucose, Bld 11/18/2022 96  70 - 99 mg/dL Final   Glucose reference range applies only to samples taken after fasting for at least 8 hours.   BUN 11/18/2022 13  6 - 20 mg/dL Final   Creatinine, Ser 11/18/2022 1.01  0.61 - 1.24 mg/dL Final   Calcium 11/18/2022 8.8 (L)  8.9 - 10.3 mg/dL Final   Total Protein 11/18/2022 8.5 (H)  6.5 - 8.1 g/dL Final   Albumin 11/18/2022 3.5  3.5 - 5.0 g/dL Final   AST  11/18/2022 43 (H)  15 - 41 U/L Final   ALT 11/18/2022 44  0 - 44 U/L Final   Alkaline Phosphatase 11/18/2022 57  38 - 126 U/L Final   Total Bilirubin 11/18/2022 0.9  0.3 - 1.2 mg/dL Final   GFR, Estimated 11/18/2022 >60  >60 mL/min Final   Comment: (NOTE) Calculated using the CKD-EPI Creatinine Equation (2021)    Anion gap 11/18/2022 11  5 - 15 Final   Performed at [place] [address], [place], [place] [address]   Troponin I (High Sensitivity) 11/19/2022 17  <18 ng/L Final   Comment: (NOTE) Elevated high sensitivity troponin I (hsTnI) values and significant  changes across serial measurements may suggest ACS but many other  chronic and acute conditions are known to elevate hsTnI results.  Refer to the "Links" section for chest pain algorithms and additional  guidance. Performed at [facility], [place] [address], [place], [place] [address]   Admission on 11/14/2022, Discharged on 11/14/2022  Component Date Value Ref Range Status   Glucose-Capillary 11/14/2022 104 (H)  70 - 99 mg/dL Final   Glucose reference range applies only to samples taken after fasting for at least 8 hours.   Comment 1 11/14/2022 Notify RN   Final   Comment 2 11/14/2022 Document in Chart   Final   Sodium 11/14/2022 137  135 - 145 mmol/L Final   Potassium 11/14/2022 3.1 (L)  3.5 - 5.1 mmol/L Final   Chloride 11/14/2022 105  98 - 111 mmol/L Final   CO2 11/14/2022 20 (L)  22 - 32 mmol/L Final   Glucose, Bld 11/14/2022 100 (H)  70 - 99 mg/dL Final   Glucose reference range applies only to samples taken after fasting for at least 8 hours.   BUN 11/14/2022 8  6 - 20 mg/dL Final   Creatinine, Ser 11/14/2022 0.96  0.61 - 1.24 mg/dL Final   Calcium 11/14/2022 8.6 (L)  8.9 - 10.3 mg/dL Final   Total Protein 11/14/2022 8.7 (H)  6.5 - 8.1 g/dL Final   Albumin 11/14/2022 3.4 (L)  3.5 - 5.0 g/dL Final   AST 11/14/2022 34  15 - 41 U/L Final   ALT 11/14/2022 30  0 - 44 U/L Final   Alkaline Phosphatase  11/14/2022 49  38 - 126 U/L Final   Total Bilirubin 11/14/2022 0.9  0.3 - 1.2 mg/dL Final   GFR, Estimated 11/14/2022 >60  >60 mL/min Final   Comment: (NOTE) Calculated using the CKD-EPI Creatinine Equation (2021)    Anion gap 11/14/2022 12  5 - 15 Final  Performed at [facility], [place] [address], [place], [place] [id number]   Salicylate Lvl 99991111 <7.0 (L)  7.0 - 30.0 mg/dL Final   Performed at [place] [address] [place], [place] [address]   Acetaminophen (Tylenol), Serum 11/14/2022 <10 (L)  10 - 30 ug/mL Final   Comment: (NOTE) Therapeutic concentrations vary significantly. A range of 10-30 ug/mL  may be an effective concentration for many patients. However, some  are best treated at concentrations outside of this range. Acetaminophen concentrations >150 ug/mL at 4 hours after ingestion  and >50 ug/mL at 12 hours after ingestion are often associated with  toxic reactions.  Performed at [facility], [place] [address], [place], [place] [address]    Alcohol, Ethyl (B) 11/14/2022 133 (H)  <10 mg/dL Final   Comment: (NOTE) Lowest detectable limit for serum alcohol is 10 mg/dL.  For medical purposes only. Performed at [facility], [place] [address], [place], [place] [address]    WBC 11/14/2022 3.6 (L)  4.0 - 10.5 K/uL Final   RBC 11/14/2022 4.19 (L)  4.22 - 5.81 MIL/uL Final   Hemoglobin 11/14/2022 13.1  13.0 - 17.0 g/dL Final   HCT 11/14/2022 39.2  39.0 - 52.0 % Final   MCV 11/14/2022 93.6  80.0 - 100.0 fL Final   MCH 11/14/2022 31.3  26.0 - 34.0 pg Final   MCHC 11/14/2022 33.4  30.0 - 36.0 g/dL Final   RDW 11/14/2022 14.3  11.5 - 15.5 % Final   Platelets 11/14/2022 245  150 - 400 K/uL Final   nRBC 11/14/2022 0.0  0.0 - 0.2 % Final   Neutrophils Relative % 11/14/2022 49  % Final   Neutro Abs 11/14/2022 1.7  1.7 - 7.7 K/uL Final   Lymphocytes Relative 11/14/2022 38  % Final   Lymphs Abs 11/14/2022 1.3  0.7 - 4.0 K/uL Final   Monocytes  Relative 11/14/2022 10  % Final   Monocytes Absolute 11/14/2022 0.4  0.1 - 1.0 K/uL Final   Eosinophils Relative 11/14/2022 2  % Final   Eosinophils Absolute 11/14/2022 0.1  0.0 - 0.5 K/uL Final   Basophils Relative 11/14/2022 1  % Final   Basophils Absolute 11/14/2022 0.0  0.0 - 0.1 K/uL Final   Immature Granulocytes 11/14/2022 0  % Final   Abs Immature Granulocytes 11/14/2022 0.01  0.00 - 0.07 K/uL Final   Performed at [facility], [place] [address] [place], [place] [address]  Admission on 09/12/2022, Discharged on 09/13/2022  Component Date Value Ref Range Status   WBC 09/12/2022 3.8 (L)  4.0 - 10.5 K/uL Final   RBC 09/12/2022 4.25  4.22 - 5.81 MIL/uL Final   Hemoglobin 09/12/2022 13.4  13.0 - 17.0 g/dL Final   HCT 09/12/2022 40.1  39.0 - 52.0 % Final   MCV 09/12/2022 94.4  80.0 - 100.0 fL Final   MCH 09/12/2022 31.5  26.0 - 34.0 pg Final   MCHC 09/12/2022 33.4  30.0 - 36.0 g/dL Final   RDW 09/12/2022 14.2  11.5 - 15.5 % Final   Platelets 09/12/2022 295  150 - 400 K/uL Final   nRBC 09/12/2022 0.0  0.0 - 0.2 % Final   Neutrophils Relative % 09/12/2022 50  % Final   Neutro Abs 09/12/2022 1.9  1.7 - 7.7 K/uL Final   Lymphocytes Relative 09/12/2022 37  % Final   Lymphs Abs 09/12/2022 1.4  0.7 - 4.0 K/uL Final   Monocytes  Relative 09/12/2022 10  % Final   Monocytes Absolute 09/12/2022 0.4  0.1 - 1.0 K/uL Final   Eosinophils Relative 09/12/2022 2  % Final   Eosinophils Absolute 09/12/2022 0.1  0.0 - 0.5 K/uL Final   Basophils Relative 09/12/2022 1  % Final   Basophils Absolute 09/12/2022 0.0  0.0 - 0.1 K/uL Final   Immature Granulocytes 09/12/2022 0  % Final   Abs Immature Granulocytes 09/12/2022 0.01  0.00 - 0.07 K/uL Final   Reactive, Benign Lymphocytes 09/12/2022 PRESENT   Final   Ovalocytes 09/12/2022 PRESENT   Final   Stomatocytes 09/12/2022 PRESENT   Final   Platelet Morphology 09/12/2022 NORMAL   Final   Performed at [facility], [place] [address],  [place], [place] [address]   Sodium 09/12/2022 135  135 - 145 mmol/L Final   Potassium 09/12/2022 3.2 (L)  3.5 - 5.1 mmol/L Final   Chloride 09/12/2022 103  98 - 111 mmol/L Final   CO2 09/12/2022 21 (L)  22 - 32 mmol/L Final   Glucose, Bld 09/12/2022 92  70 - 99 mg/dL Final   Glucose reference range applies only to samples taken after fasting for at least 8 hours.   BUN 09/12/2022 7  6 - 20 mg/dL Final   Creatinine, Ser 09/12/2022 0.90  0.61 - 1.24 mg/dL Final   Calcium 09/12/2022 8.6 (L)  8.9 - 10.3 mg/dL Final   GFR, Estimated 09/12/2022 >60  >60 mL/min Final   Comment: (NOTE) Calculated using the CKD-EPI Creatinine Equation (2021)    Anion gap 09/12/2022 11  5 - 15 Final   Performed at [facility], [place] [address], [place], [place] [address]   Lactic Acid, Venous 09/12/2022 2.2 (HH)  0.5 - 1.9 mmol/L Final   Comment: CRITICAL RESULT CALLED TO, READ BACK BY AND VERIFIED WITH: COLES,L AT 0026 ON 09/13/22 BY LUZOLOP Performed at [facility], [place] [address], [place], [place] [address]    Specimen Description 09/12/2022    Final                   Value:BLOOD BLOOD RIGHT HAND Performed at [facility], [place] [address], [place], [place] [address]    Special Requests 09/12/2022    Final                   Value:BOTTLES DRAWN AEROBIC AND ANAEROBIC Blood Culture results may not be optimal due to an inadequate volume of blood received in culture bottles Performed at [facility], [place] [address], [place], [place] [address]    Culture 09/12/2022    Final                   Value:NO GROWTH 5 DAYS Performed at [place] [address] [place], [place] [address]    Report Status 09/12/2022 09/18/2022 FINAL   Final   Prothrombin Time 09/13/2022 13.5  11.4 - 15.2 seconds Final   INR 09/13/2022 1.0  0.8 - 1.2 Final   Comment: (NOTE) INR goal varies based on device and disease states. Performed at [facility], [place] [address] [place], [place] [address]    Lactic Acid, Venous 09/13/2022 3.1 (HH)  0.5 - 1.9 mmol/L Final   Comment: CRITICAL RESULT CALLED TO, READ BACK BY AND VERIFIED WITH [place],J AT 0529 ON 09/13/22 BY LUZOLOP Performed at [facility], [place] [address] [place], [place] [address]  Lactic Acid, Venous 09/13/2022 1.6  0.5 - 1.9 mmol/L Final   Performed at [place] [address] [place], [place] [address]  Admission on 08/21/2022, Discharged on 08/21/2022  Component Date Value Ref Range Status   WBC 08/21/2022 8.0  4.0 - 10.5 K/uL Final   RBC 08/21/2022 4.13 (L)  4.22 - 5.81 MIL/uL Final   Hemoglobin 08/21/2022 12.9 (L)  13.0 - 17.0 g/dL Final   HCT 08/21/2022 39.1  39.0 - 52.0 % Final   MCV 08/21/2022 94.7  80.0 - 100.0 fL Final   MCH 08/21/2022 31.2  26.0 - 34.0 pg Final   MCHC 08/21/2022 33.0  30.0 - 36.0 g/dL Final   RDW 08/21/2022 13.7  11.5 - 15.5 % Final   Platelets 08/21/2022 256  150 - 400 K/uL Final   nRBC 08/21/2022 0.0  0.0 - 0.2 % Final   Neutrophils Relative % 08/21/2022 80  % Final   Neutro Abs 08/21/2022 6.3  1.7 - 7.7 K/uL Final   Lymphocytes Relative 08/21/2022 10  % Final   Lymphs Abs 08/21/2022 0.8  0.7 - 4.0 K/uL Final   Monocytes Relative 08/21/2022 10  % Final   Monocytes Absolute 08/21/2022 0.8  0.1 - 1.0 K/uL Final   Eosinophils Relative 08/21/2022 0  % Final   Eosinophils Absolute 08/21/2022 0.0  0.0 - 0.5 K/uL Final   Basophils Relative 08/21/2022 0  % Final   Basophils Absolute 08/21/2022 0.0  0.0 - 0.1 K/uL Final   Immature Granulocytes 08/21/2022 0  % Final   Abs Immature Granulocytes 08/21/2022 0.02  0.00 - 0.07 K/uL Final   Performed at [facility], [place] [address], [place], [place] [address]   Sodium 08/21/2022 137  135 - 145 mmol/L Final   Potassium 08/21/2022 3.8  3.5 - 5.1 mmol/L Final   Chloride 08/21/2022 101  98 - 111 mmol/L Final   BUN 08/21/2022 9  6 - 20  mg/dL Final   Creatinine, Ser 08/21/2022 1.10  0.61 - 1.24 mg/dL Final   Glucose, Bld 08/21/2022 89  70 - 99 mg/dL Final   Glucose reference range applies only to samples taken after fasting for at least 8 hours.   Calcium, Ion 08/21/2022 1.16  1.15 - 1.40 mmol/L Final   TCO2 08/21/2022 25  22 - 32 mmol/L Final   Hemoglobin 08/21/2022 13.6  13.0 - 17.0 g/dL Final   HCT 08/21/2022 40.0  39.0 - 52.0 % Final   SARS Coronavirus 2 by RT PCR 08/21/2022 NEGATIVE  NEGATIVE Final   Comment: (NOTE) SARS-CoV-2 target nucleic acids are NOT DETECTED.  The SARS-CoV-2 RNA is generally detectable in upper and lower respiratory specimens during the acute phase of infection. The lowest concentration of SARS-CoV-2 viral copies this assay can detect is 250 copies / mL. A negative result does not preclude SARS-CoV-2 infection and should not be used as the sole basis for treatment or other patient management decisions.  A negative result may occur with improper specimen collection / handling, submission of specimen other than nasopharyngeal swab, presence of viral mutation(s) within the areas targeted by this assay, and inadequate number of viral copies (<250 copies / mL). A negative result must be combined with clinical observations, patient history, and epidemiological information.  Fact Sheet for Patients:   https://www.patel.info/  Fact Sheet for Healthcare Providers: https://hall.com/  This test is not yet approved or  cleared by the [place] and has been authorized for detection and/or diagnosis of SARS-CoV-2 by FDA under an Emergency Use Authorization (EUA).  This EUA will remain in effect (meaning this test can be used) for the duration of the COVID-19 declaration under Section 564(b)(1) of the Act, 21 U.S.C. section 360bbb-3(b)(1), unless the authorization is terminated or revoked sooner.  Performed at [facility], [place] [address] [place], [place] [address]    Troponin I (High Sensitivity) 08/21/2022 9  <18 ng/L Final   Comment: (NOTE) Elevated high sensitivity troponin I (hsTnI) values and significant  changes across serial measurements may suggest ACS but many other  chronic and acute conditions are known to elevate hsTnI results.  Refer to the "Links" section for chest pain algorithms and additional  guidance. Performed at [facility], [place] [address], [place], [place] [address]    Troponin I (High Sensitivity) 08/21/2022 9  <18 ng/L Final   Comment: (NOTE) Elevated high sensitivity troponin I (hsTnI) values and significant  changes across serial measurements may suggest ACS but many other  chronic and acute conditions are known to elevate hsTnI results.  Refer to the "Links" section for chest pain algorithms and additional  guidance. Performed at [facility], [place] [address] [place], [place] [address]    Opiates 08/21/2022 NONE DETECTED  NONE DETECTED Final   Cocaine 08/21/2022 POSITIVE (A)  NONE DETECTED Final   Benzodiazepines 08/21/2022 NONE DETECTED  NONE DETECTED Final   Amphetamines 08/21/2022 NONE DETECTED  NONE DETECTED Final   Tetrahydrocannabinol 08/21/2022 NONE DETECTED  NONE DETECTED Final   Barbiturates 08/21/2022 NONE DETECTED  NONE DETECTED Final   Comment: (NOTE) DRUG SCREEN FOR MEDICAL PURPOSES ONLY.  IF CONFIRMATION IS NEEDED FOR ANY PURPOSE, NOTIFY LAB WITHIN 5 DAYS.  LOWEST DETECTABLE LIMITS FOR URINE DRUG SCREEN Drug Class                     Cutoff (ng/mL) Amphetamine and metabolites    1000 Barbiturate and metabolites    200 Benzodiazepine                 200 Opiates and metabolites        300 Cocaine and metabolites        300 THC                            50 Performed at [facility], [place] [address], [place], [place] [address]    Alcohol, Ethyl (B) 08/21/2022 <10  <10 mg/dL Final   Comment: (NOTE) Lowest detectable limit  for serum alcohol is 10 mg/dL.  For medical purposes only. Performed at [facility], [place] [address] [place],  [address]    SARS Coronavirus 2 by RT PCR 08/21/2022 NEGATIVE  NEGATIVE Final   Comment: (NOTE) SARS-CoV-2 target nucleic acids are NOT DETECTED.  The SARS-CoV-2 RNA is generally detectable in upper respiratory specimens during the acute phase of infection. The lowest concentration of SARS-CoV-2 viral copies this assay can detect is 138 copies/mL. A negative result does not preclude SARS-Cov-2 infection and should not be used as the sole basis for treatment or other patient management decisions. A negative result may occur with  improper specimen collection/handling, submission of specimen other than nasopharyngeal swab, presence of viral mutation(s) within the areas targeted by this assay, and inadequate number of viral copies(<138 copies/mL). A negative  result must be combined with clinical observations, patient history, and epidemiological information. The expected result is Negative.  Fact Sheet for Patients:  EntrepreneurPulse.com.au  Fact Sheet for Healthcare Providers:  IncredibleEmployment.be  This test is no                          t yet approved or cleared by the [place] FDA and  has been authorized for detection and/or diagnosis of SARS-CoV-2 by FDA under an Emergency Use Authorization (EUA). This EUA will remain  in effect (meaning this test can be used) for the duration of the COVID-19 declaration under Section 564(b)(1) of the Act, 21 U.S.C.section 360bbb-3(b)(1), unless the authorization is terminated  or revoked sooner.       Influenza A by PCR 08/21/2022 NEGATIVE  NEGATIVE Final   Influenza B by PCR 08/21/2022 NEGATIVE  NEGATIVE Final   Comment: (NOTE) The Xpert Xpress SARS-CoV-2/FLU/RSV plus assay is intended as an aid in the diagnosis of influenza from Nasopharyngeal swab specimens  and should not be used as a sole basis for treatment. Nasal washings and aspirates are unacceptable for Xpert Xpress SARS-CoV-2/FLU/RSV testing.  Fact Sheet for Patients: EntrepreneurPulse.com.au  Fact Sheet for Healthcare Providers: IncredibleEmployment.be  This test is not yet approved or cleared by the [place] FDA and has been authorized for detection and/or diagnosis of SARS-CoV-2 by FDA under an Emergency Use Authorization (EUA). This EUA will remain in effect (meaning this test can be used) for the duration of the COVID-19 declaration under Section 564(b)(1) of the Act, 21 U.S.C. section 360bbb-3(b)(1), unless the authorization is terminated or revoked.  Performed at [facility], [place] [address] [place], [place] [address]   Admission on 08/05/2022, Discharged on 08/05/2022  Component Date Value Ref Range Status   WBC 08/05/2022 4.0  4.0 - 10.5 K/uL Final   RBC 08/05/2022 4.38  4.22 - 5.81 MIL/uL Final   Hemoglobin 08/05/2022 13.7  13.0 - 17.0 g/dL Final   HCT 08/05/2022 41.1  39.0 - 52.0 % Final   MCV 08/05/2022 93.8  80.0 - 100.0 fL Final   MCH 08/05/2022 31.3  26.0 - 34.0 pg Final   MCHC 08/05/2022 33.3  30.0 - 36.0 g/dL Final   RDW 08/05/2022 13.4  11.5 - 15.5 % Final   Platelets 08/05/2022 235  150 - 400 K/uL Final   nRBC 08/05/2022 0.0  0.0 - 0.2 % Final   Neutrophils Relative % 08/05/2022 63  % Final   Neutro Abs 08/05/2022 2.5  1.7 - 7.7 K/uL Final   Lymphocytes Relative 08/05/2022 25  % Final   Lymphs Abs 08/05/2022 1.0  0.7 - 4.0 K/uL Final   Monocytes Relative 08/05/2022 10  % Final   Monocytes Absolute 08/05/2022 0.4  0.1 - 1.0 K/uL Final   Eosinophils Relative 08/05/2022 1  % Final   Eosinophils Absolute 08/05/2022 0.0  0.0 - 0.5 K/uL Final   Basophils Relative 08/05/2022 1  % Final   Basophils Absolute 08/05/2022 0.0  0.0 - 0.1 K/uL Final   Immature Granulocytes 08/05/2022 0  % Final   Abs Immature  Granulocytes 08/05/2022 0.01  0.00 - 0.07 K/uL Final   Performed at [facility], [place] [address], [place], [place] [address]   Sodium 08/05/2022 135  135 - 145 mmol/L Final   Potassium 08/05/2022 3.5  3.5 - 5.1 mmol/L Final   Chloride 08/05/2022 106  98 - 111 mmol/L Final  CO2 08/05/2022 21 (L)  22 - 32 mmol/L Final   Glucose, Bld 08/05/2022 97  70 - 99 mg/dL Final   Glucose reference range applies only to samples taken after fasting for at least 8 hours.   BUN 08/05/2022 12  6 - 20 mg/dL Final   Creatinine, Ser 08/05/2022 0.92  0.61 - 1.24 mg/dL Final   Calcium 08/05/2022 8.6 (L)  8.9 - 10.3 mg/dL Final   Total Protein 08/05/2022 8.3 (H)  6.5 - 8.1 g/dL Final   Albumin 08/05/2022 3.7  3.5 - 5.0 g/dL Final   AST 08/05/2022 35  15 - 41 U/L Final   ALT 08/05/2022 39  0 - 44 U/L Final   Alkaline Phosphatase 08/05/2022 56  38 - 126 U/L Final   Total Bilirubin 08/05/2022 0.3  0.3 - 1.2 mg/dL Final   GFR, Estimated 08/05/2022 >60  >60 mL/min Final   Comment: (NOTE) Calculated using the CKD-EPI Creatinine Equation (2021)    Anion gap 08/05/2022 8  5 - 15 Final   Performed at [place] [address] [place], [place] [address]   Lipase 08/05/2022 67 (H)  11 - 51 U/L Final   Performed at [place] [address] [place], [place] [address]   Troponin I (High Sensitivity) 08/05/2022 5  <18 ng/L Final   Comment: (NOTE) Elevated high sensitivity troponin I (hsTnI) values and significant  changes across serial measurements may suggest ACS but many other  chronic and acute conditions are known to elevate hsTnI results.  Refer to the "Links" section for chest pain algorithms and additional  guidance. Performed at [facility], [place] [address] [place] [address], [place], [place] [address]    Troponin I (High Sensitivity) 08/05/2022 6  <18 ng/L Final   Comment: (NOTE) Elevated high sensitivity troponin I (hsTnI) values and significant  changes across serial measurements may  suggest ACS but many other  chronic and acute conditions are known to elevate hsTnI results.  Refer to the "Links" section for chest pain algorithms and additional  guidance. Performed at [facility], [place] [address] [place], [place] [address]   Admission on 06/26/2022, Discharged on 06/27/2022  Component Date Value Ref Range Status   Sodium 06/26/2022 141  135 - 145 mmol/L Final   Potassium 06/26/2022 3.6  3.5 - 5.1 mmol/L Final   Chloride 06/26/2022 106  98 - 111 mmol/L Final   CO2 06/26/2022 22  22 - 32 mmol/L Final   Glucose, Bld 06/26/2022 83  70 - 99 mg/dL Final   Glucose reference range applies only to samples taken after fasting for at least 8 hours.   BUN 06/26/2022 14  6 - 20 mg/dL Final   Creatinine, Ser 06/26/2022 1.12  0.61 - 1.24 mg/dL Final   Calcium 06/26/2022 9.1  8.9 - 10.3 mg/dL Final   Total Protein 06/26/2022 8.6 (H)  6.5 - 8.1 g/dL Final   Albumin 06/26/2022 3.6  3.5 - 5.0 g/dL Final   AST 06/26/2022 35  15 - 41 U/L Final   ALT 06/26/2022 38  0 - 44 U/L Final   Alkaline Phosphatase 06/26/2022 55  38 - 126 U/L Final   Total Bilirubin 06/26/2022 0.4  0.3 - 1.2 mg/dL Final   GFR, Estimated 06/26/2022 >60  >60 mL/min Final   Comment: (NOTE) Calculated using the CKD-EPI Creatinine Equation (2021)    Anion gap 06/26/2022 13  5 - 15 Final   Performed at [facility], [place] [address] [place], [place] [address]   Alcohol, Ethyl (B) 06/26/2022 86 (H)  <10 mg/dL Final   Comment: (NOTE) Lowest detectable limit for serum alcohol is 10 mg/dL.  For medical purposes only. Performed at [facility], [place] [address] [place], [place] [id number]    Salicylate Lvl [id number] <7.0 (L)  7.0 - 30.0 mg/dL Final   Performed at [place] [address], [place], [place] [address]   Acetaminophen (Tylenol), Serum 06/26/2022 <10 (L)  10 - 30 ug/mL Final   Comment: (NOTE) Therapeutic concentrations vary significantly. A range of 10-30 ug/mL  may be an  effective concentration for many patients. However, some  are best treated at concentrations outside of this range. Acetaminophen concentrations >150 ug/mL at 4 hours after ingestion  and >50 ug/mL at 12 hours after ingestion are often associated with  toxic reactions.  Performed at [facility], [place] [address], [place], [place] [address]    WBC 06/26/2022 5.6  4.0 - 10.5 K/uL Final   RBC 06/26/2022 4.66  4.22 - 5.81 MIL/uL Final   Hemoglobin 06/26/2022 14.5  13.0 - 17.0 g/dL Final   HCT 06/26/2022 43.9  39.0 - 52.0 % Final   MCV 06/26/2022 94.2  80.0 - 100.0 fL Final   MCH 06/26/2022 31.1  26.0 - 34.0 pg Final   MCHC 06/26/2022 33.0  30.0 - 36.0 g/dL Final   RDW 06/26/2022 15.0  11.5 - 15.5 % Final   Platelets 06/26/2022 267  150 - 400 K/uL Final   nRBC 06/26/2022 0.0  0.0 - 0.2 % Final   Performed at [place] [address], [place], [place] [address]   Opiates 06/26/2022 NONE DETECTED  NONE DETECTED Final   Cocaine 06/26/2022 NONE DETECTED  NONE DETECTED Final   Benzodiazepines 06/26/2022 NONE DETECTED  NONE DETECTED Final   Amphetamines 06/26/2022 NONE DETECTED  NONE DETECTED Final   Tetrahydrocannabinol 06/26/2022 POSITIVE (A)  NONE DETECTED Final   Barbiturates 06/26/2022 NONE DETECTED  NONE DETECTED Final   Comment: (NOTE) DRUG SCREEN FOR MEDICAL PURPOSES ONLY.  IF CONFIRMATION IS NEEDED FOR ANY PURPOSE, NOTIFY LAB WITHIN 5 DAYS.  LOWEST DETECTABLE LIMITS FOR URINE DRUG SCREEN Drug Class                     Cutoff (ng/mL) Amphetamine and metabolites    1000 Barbiturate and metabolites    200 Benzodiazepine                 [id number] Tricyclics and metabolites     300 Opiates and metabolites        300 Cocaine and metabolites        300 THC                            50 Performed at [facility], [place] [address] [place],  [address]   Admission on 06/19/2022, Discharged on 06/19/2022  Component Date Value Ref Range Status   WBC 06/19/2022 4.8   4.0 - 10.5 K/uL Final   RBC 06/19/2022 4.51  4.22 - 5.81 MIL/uL Final   Hemoglobin 06/19/2022 13.9  13.0 - 17.0 g/dL Final   HCT 06/19/2022 41.5  39.0 - 52.0 % Final   MCV 06/19/2022 92.0  80.0 - 100.0 fL Final   MCH 06/19/2022 30.8  26.0 - 34.0 pg Final   MCHC 06/19/2022  33.5  30.0 - 36.0 g/dL Final   RDW 06/19/2022 14.3  11.5 - 15.5 % Final   Platelets 06/19/2022 286  150 - 400 K/uL Final   nRBC 06/19/2022 0.0  0.0 - 0.2 % Final   Performed at [facility], [place], [place] [address]   Sodium 06/19/2022 140  135 - 145 mmol/L Final   Potassium 06/19/2022 2.9 (L)  3.5 - 5.1 mmol/L Final   Chloride 06/19/2022 106  98 - 111 mmol/L Final   CO2 06/19/2022 25  22 - 32 mmol/L Final   Glucose, Bld 06/19/2022 98  70 - 99 mg/dL Final   Glucose reference range applies only to samples taken after fasting for at least 8 hours.   BUN 06/19/2022 12  6 - 20 mg/dL Final   Creatinine, Ser 06/19/2022 1.07  0.61 - 1.24 mg/dL Final   Calcium 06/19/2022 8.6 (L)  8.9 - 10.3 mg/dL Final   Total Protein 06/19/2022 8.0  6.5 - 8.1 g/dL Final   Albumin 06/19/2022 3.6  3.5 - 5.0 g/dL Final   AST 06/19/2022 34  15 - 41 U/L Final   ALT 06/19/2022 32  0 - 44 U/L Final   Alkaline Phosphatase 06/19/2022 55  38 - 126 U/L Final   Total Bilirubin 06/19/2022 0.4  0.3 - 1.2 mg/dL Final   GFR, Estimated 06/19/2022 >60  >60 mL/min Final   Comment: (NOTE) Calculated using the CKD-EPI Creatinine Equation (2021)    Anion gap 06/19/2022 9  5 - 15 Final   Performed at [facility], [place], [place] [address]   Troponin I (High Sensitivity) 06/19/2022 6  <18 ng/L Final   Comment: (NOTE) Elevated high sensitivity troponin I (hsTnI) values and significant  changes across serial measurements may suggest ACS but many other  chronic and acute conditions are known to elevate hsTnI results.  Refer to the "Links" section for chest pain algorithms and additional   guidance. Performed at [facility], [address] [place], [place] [address]    Lipase 06/19/2022 35  11 - 51 U/L Final   Performed at [facility] [facility], [address] [place], [place] [address]   Troponin I (High Sensitivity) 06/19/2022 7  <18 ng/L Final   Comment: (NOTE) Elevated high sensitivity troponin I (hsTnI) values and significant  changes across serial measurements may suggest ACS but many other  chronic and acute conditions are known to elevate hsTnI results.  Refer to the "Links" section for chest pain algorithms and additional  guidance. Performed at [facility], [address] [place], [place] [address]   Admission on 06/09/2022, Discharged on 06/10/2022  Component Date Value Ref Range Status   Color, Urine 06/10/2022 STRAW (A)  YELLOW Final   APPearance 06/10/2022 CLEAR  CLEAR Final   Specific Gravity, Urine 06/10/2022 1.001 (L)  1.005 - 1.030 Final   pH 06/10/2022 5.0  5.0 - 8.0 Final   Glucose, UA 06/10/2022 NEGATIVE  NEGATIVE mg/dL Final   Hgb urine dipstick 06/10/2022 SMALL (A)  NEGATIVE Final   Bilirubin Urine 06/10/2022 NEGATIVE  NEGATIVE Final   Ketones, ur 06/10/2022 NEGATIVE  NEGATIVE mg/dL Final   Protein, ur 06/10/2022 NEGATIVE  NEGATIVE mg/dL Final   Nitrite 06/10/2022 NEGATIVE  NEGATIVE Final   Leukocytes,Ua 06/10/2022 NEGATIVE  NEGATIVE Final   RBC / HPF 06/10/2022 0-5  0 - 5 RBC/hpf Final   WBC, UA 06/10/2022 0-5  0 - 5 WBC/hpf Final  Bacteria, UA 06/10/2022 NONE SEEN  NONE SEEN Final   Performed at [facility], [place] [address] [place], [place] [address]  There may be more visits with results that are not included.    Allergies: Bee venom, Lisinopril, and Sulfamethoxazole-trimethoprim  Medications:  Facility Ordered Medications  Medication   acetaminophen (TYLENOL) tablet 650 mg   alum & mag hydroxide-simeth (MAALOX/MYLANTA) 200-200-20 MG/5ML suspension 30 mL   magnesium hydroxide (MILK OF MAGNESIA)  suspension 30 mL   traZODone (DESYREL) tablet 50 mg   [COMPLETED] LORazepam (ATIVAN) tablet 2 mg   thiamine (VITAMIN B1) injection 100 mg   [START ON 12/07/2022] thiamine (VITAMIN B1) tablet 100 mg   multivitamin with minerals tablet 1 tablet   LORazepam (ATIVAN) tablet 1 mg   hydrOXYzine (ATARAX) tablet 25 mg   loperamide (IMODIUM) capsule 2-4 mg   ondansetron (ZOFRAN-ODT) disintegrating tablet 4 mg   PTA Medications  Medication Sig   amoxicillin-clavulanate (AUGMENTIN) 875-125 MG tablet Take 1 tablet by mouth every 12 (twelve) hours.    Medical Decision Making  Patient presented to [facility] requesting assistance with substance abuse however he is too intoxicated to participate in assessment.  Patient will be admitted to [facility] for continuous assessment with follow-up by psychiatry.   Lab Orders         Resp panel by RT-PCR (RSV, Flu A&B, Covid) Anterior Nasal Swab         CBC with Differential/Platelet         Comprehensive metabolic panel         Hemoglobin A1c         Ethanol         Lipid panel         TSH         POCT Urine Drug Screen - (I-Screen)         POC SARS Coronavirus 2 Ag     Initiate CIWA protocol -lorazepam 1 mg every 6 hours prn for CIWA >10 -thiamine 100 mg daily for nutritional supplementation -hydroxyzine 25 mg every 6 hours prn for anxiety, CIWA < or = 10 -ondansetron 4 mg ODT every 6 hours prn nausea/vomiting -loperamide 2-4 mg capsule prn diarrhea or loose stools  .    Recommendations  Based on my evaluation the patient does not appear to have an emergency medical condition.  Ophelia Shoulder, NP 12/06/22  4:02 AM

## 2022-12-06 NOTE — ED Provider Notes (Signed)
FBC/OBS ASAP Discharge Summary  Date and Time: 12/06/2022 11:56 AM  Name: William Powers  MRN:  [id number]   Discharge Diagnoses:  Final diagnoses:  Substance use disorder    Subjective: patient presented to [facility] as a walk in requesting help with substance abuse.  He was admitted to the continuous assessment unit.  William Powers, 42 y.o., male patient seen face to face by this provider and chart reviewed on 12/06/22.  Per chart review patient has history of polysubstance abuse.  He has been admitted to the [facility] in the past.  He is homeless and has plans to return to [place] where he has family.  Upon admission EtOH is 258 and patient did not provide a urine sample for UDS.  Per nursing patient has slept since being admitted to the unit.  Upon evaluation patient is observed sleeping in his bed.  He is easily awakened.  He is alert/oriented x 4 and cooperative.  Reports he is feeling better after getting some sleep.  He has normal speech and behavior.  He is disheveled in his appearance and makes good eye contact.  He is requesting to be discharged.  When discussing his presentation upon admission he blames it on the alcohol states he had drank too much. He does not remember endorsing any suicidal ideations or homicidal ideations.  He is adamantly denying SI/HI at this time.  He verbally contracts for safety.  He denies access to firearms/weapons.  He is able to converse coherently.  He does not appear to be responding to internal/external stimuli.  He does not appear psychotic or manic.  He admits to being homeless and is trying to return to [place] where he has family.  He is requesting a bus pass to go downtown.  Reports he may have enough money to be able to get a trying to get to [place].  He is educated and verbalizes understanding of mental health resources and other crisis services in the community. He is instructed to call 911 and present to the nearest emergency room should he  experience any suicidal/homicidal ideation, auditory/visual/hallucinations, or detrimental worsening of his  mental health condition. He was a also advised by Probation officer that he/she could call the toll-free phone on back of  insurance card to assist with identifying counselors and agencies in network.   Stay Summary:   William Powers was admitted to observation for alcohol abuse and intoxication. His improvement was monitored by observation and William Powers report of symptom reduction; PATIENT STATEMENT OF DOING BETTER.  Emotional and mental status was also monitored by staff.          William Powers was evaluated for stability and plans for continued recovery upon discharge.  Employment, transportation, bed availability, health status, family support, and any pending legal issues were also considered during the 24 hour observation.  He was offered further treatment options upon discharge including but not limited to Residential, Intensive Outpatient, Outpatient treatment, and resources for shelters if needed. In addition he was offered admission to the [facility] and he declined.   William Powers will follow up with the services as listed below under Follow up Information.     Upon completion of this admission the [name] was both mentally and medically stable for discharge denying suicidal/homicidal ideation, auditory/visual/tactile hallucinations, delusional thoughts and paranoia.      Total Time spent with patient: 30 minutes  Past Psychiatric History: as documented in H&P Past Medical History:  as documented in H&P Family History: as documented in H&P Family Psychiatric History: as documented in H&P Social History: as documented in H&P Tobacco Cessation:  N/A, patient does not currently use tobacco products  Current Medications:  Current Facility-Administered Medications  Medication Dose Route Frequency Provider Last Rate Last Admin   acetaminophen (TYLENOL) tablet 650 mg   650 mg Oral Q6H PRN Ajibola, Ene A, NP       alum & mag hydroxide-simeth (MAALOX/MYLANTA) 200-200-20 MG/5ML suspension 30 mL  30 mL Oral Q4H PRN Ajibola, Ene A, NP       hydrOXYzine (ATARAX) tablet 25 mg  25 mg Oral Q6H PRN Ajibola, Ene A, NP       loperamide (IMODIUM) capsule 2-4 mg  2-4 mg Oral PRN Ajibola, Ene A, NP       LORazepam (ATIVAN) tablet 1 mg  1 mg Oral Q6H PRN Ajibola, Ene A, NP       magnesium hydroxide (MILK OF MAGNESIA) suspension 30 mL  30 mL Oral Daily PRN Ajibola, Ene A, NP       multivitamin with minerals tablet 1 tablet  1 tablet Oral Daily Ajibola, Ene A, NP   1 tablet at 12/06/22 0943   ondansetron (ZOFRAN-ODT) disintegrating tablet 4 mg  4 mg Oral Q6H PRN Ajibola, Ene A, NP       [START ON 12/07/2022] thiamine (VITAMIN B1) tablet 100 mg  100 mg Oral Daily Ajibola, Ene A, NP       traZODone (DESYREL) tablet 50 mg  50 mg Oral QHS PRN Ajibola, Ene A, NP       Current Outpatient Medications  Medication Sig Dispense Refill   amoxicillin-clavulanate (AUGMENTIN) 875-125 MG tablet Take 1 tablet by mouth every 12 (twelve) hours. 14 tablet 0    PTA Medications:  Facility Ordered Medications  Medication   acetaminophen (TYLENOL) tablet 650 mg   alum & mag hydroxide-simeth (MAALOX/MYLANTA) 200-200-20 MG/5ML suspension 30 mL   magnesium hydroxide (MILK OF MAGNESIA) suspension 30 mL   traZODone (DESYREL) tablet 50 mg   [COMPLETED] LORazepam (ATIVAN) tablet 2 mg   [COMPLETED] thiamine (VITAMIN B1) injection 100 mg   [START ON 12/07/2022] thiamine (VITAMIN B1) tablet 100 mg   multivitamin with minerals tablet 1 tablet   LORazepam (ATIVAN) tablet 1 mg   hydrOXYzine (ATARAX) tablet 25 mg   loperamide (IMODIUM) capsule 2-4 mg   ondansetron (ZOFRAN-ODT) disintegrating tablet 4 mg   PTA Medications  Medication Sig   amoxicillin-clavulanate (AUGMENTIN) 875-125 MG tablet Take 1 tablet by mouth every 12 (twelve) hours.       05/03/2022   12:30 PM 08/11/2021    8:12 AM   Depression screen PHQ 2/9  Decreased Interest 1 0  Down, Depressed, Hopeless 2 0  PHQ - 2 Score 3 0  Altered sleeping 2   Tired, decreased energy 1   Change in appetite 0   Feeling bad or failure about yourself  2   Trouble concentrating 0   Moving slowly or fidgety/restless 0   Suicidal thoughts 2   PHQ-9 Score 10   Difficult doing work/chores Somewhat difficult     Flowsheet Row ED from 12/06/2022 in [facility] ED from 12/03/2022 in [facility] Emergency Department at [facility] ED from 11/18/2022 in [facility] Emergency Department at [place] Low Risk No Risk No Risk       Musculoskeletal  Strength & Muscle Tone: within normal limits  Gait & Station: normal Patient leans: N/A  Psychiatric Specialty Exam  Presentation  General Appearance:  Casual  Eye Contact: Good  Speech: Clear and Coherent; Normal Rate  Speech Volume: Normal  Handedness: Right   Mood and Affect  Mood: Anxious  Affect: Congruent   Thought Process  Thought Processes: Coherent  Descriptions of Associations:Intact  Orientation:Full (Time, Place and Person)  Thought Content:Logical  Diagnosis of Schizophrenia or Schizoaffective disorder in past: No    Hallucinations:Hallucinations: None  Ideas of Reference:None  Suicidal Thoughts:Suicidal Thoughts: No  Homicidal Thoughts:Homicidal Thoughts: No   Sensorium  Memory: Immediate Good; Recent Good; Remote Good  Judgment: Good  Insight: Good   Executive Functions  Concentration: Good  Attention Span: Good  Recall: Good  Fund of Knowledge: Good  Language: Good   Psychomotor Activity  Psychomotor Activity: Psychomotor Activity: Normal   Assets  Assets: Communication Skills; Desire for Improvement; Physical Health; Resilience; Social Support   Sleep  Sleep: Sleep: Good Number of Hours of Sleep: 5   Nutritional Assessment  (For OBS and FBC admissions only) Has the patient had a weight loss or gain of 10 pounds or more in the last 3 months?: No Has the patient had a decrease in food intake/or appetite?: No Does the patient have dental problems?: No Does the patient have eating habits or behaviors that may be indicators of an eating disorder including binging or inducing vomiting?: No Has the patient recently lost weight without trying?: 0 Has the patient been eating poorly because of a decreased appetite?: 0 Malnutrition Screening Tool Score: 0    Physical Exam  Physical Exam Vitals and nursing note reviewed.  Constitutional:      General: He is not in acute distress.    Appearance: He is well-developed.  HENT:     Head: Normocephalic and atraumatic.  Eyes:     General:        Right eye: No discharge.        Left eye: No discharge.  Cardiovascular:     Rate and Rhythm: Normal rate.  Pulmonary:     Effort: Pulmonary effort is normal. No respiratory distress.  Musculoskeletal:        General: Normal range of motion.     Cervical back: Normal range of motion.  Skin:    Coloration: Skin is not jaundiced or pale.  Neurological:     Mental Status: He is alert and oriented to person, place, and time.  Psychiatric:        Attention and Perception: Attention normal.        Mood and Affect: Mood normal.        Speech: Speech normal.        Behavior: Behavior is cooperative.        Thought Content: Thought content normal.        Cognition and Memory: Cognition normal.        Judgment: Judgment normal.    Review of Systems  Constitutional: Negative.   HENT: Negative.    Eyes: Negative.   Respiratory: Negative.    Cardiovascular: Negative.   Musculoskeletal: Negative.   Skin: Negative.   Neurological: Negative.   Psychiatric/Behavioral:  Positive for substance abuse.    Blood pressure 109/65, pulse 97, temperature 97.7 F (36.5 C), temperature source Oral, resp. rate 18, SpO2 100 %. There is  no height or weight on file to calculate BMI.  Demographic Factors:  Male, Low socioeconomic status, and Unemployed  Loss Factors: Financial problems/change  in socioeconomic status  Historical Factors: NA  Risk Reduction Factors:   Sense of responsibility to family, Positive therapeutic relationship, and Positive coping skills or problem solving skills  Continued Clinical Symptoms:  Alcohol/Substance Abuse/Dependencies Previous Psychiatric Diagnoses and Treatments  Cognitive Features That Contribute To Risk:  None    Suicide Risk:  Minimal: No identifiable suicidal ideation.  Patients presenting with no risk factors but with morbid ruminations; may be classified as minimal risk based on the severity of the depressive symptoms  Plan Of Care/Follow-up recommendations:  regular  Disposition:   Discharge patient  Patient provided outpatient psychiatric resources for medication management, therapy, and substance abuse treatment.  Instructed pt to follow up with PCP and Infectious disease MD.   At the time of discharge she was provided a bus pass at his requested.  Revonda Humphrey, NP 12/06/2022, 11:56 AM

## 2022-12-11 ENCOUNTER — Emergency Department (HOSPITAL_COMMUNITY): Payer: Medicaid Other

## 2022-12-11 ENCOUNTER — Other Ambulatory Visit: Payer: Self-pay

## 2022-12-11 ENCOUNTER — Encounter (HOSPITAL_COMMUNITY): Payer: Self-pay

## 2022-12-11 ENCOUNTER — Emergency Department (HOSPITAL_COMMUNITY)
Admission: EM | Admit: 2022-12-11 | Discharge: 2022-12-11 | Disposition: A | Discharge status: Home / Self Care | Payer: Medicaid Other | Attending: Emergency Medicine | Admitting: Emergency Medicine

## 2022-12-11 DIAGNOSIS — I5022 Chronic systolic (congestive) heart failure: Secondary | ICD-10-CM | POA: Insufficient documentation

## 2022-12-11 DIAGNOSIS — Z21 Asymptomatic human immunodeficiency virus [HIV] infection status: Secondary | ICD-10-CM | POA: Insufficient documentation

## 2022-12-11 DIAGNOSIS — R45851 Suicidal ideations: Secondary | ICD-10-CM | POA: Insufficient documentation

## 2022-12-11 DIAGNOSIS — E876 Hypokalemia: Secondary | ICD-10-CM | POA: Diagnosis not present

## 2022-12-11 DIAGNOSIS — I110 Hypertensive heart disease with heart failure: Secondary | ICD-10-CM | POA: Insufficient documentation

## 2022-12-11 DIAGNOSIS — R0789 Other chest pain: Secondary | ICD-10-CM | POA: Diagnosis not present

## 2022-12-11 LAB — CBC WITH DIFFERENTIAL/PLATELET
Abs Immature Granulocytes: 0.01 10*3/uL (ref 0.00–0.07)
Basophils Absolute: 0 10*3/uL (ref 0.0–0.1)
Basophils Relative: 1 %
Eosinophils Absolute: 0.1 10*3/uL (ref 0.0–0.5)
Eosinophils Relative: 3 %
HCT: 37.7 % — ABNORMAL LOW (ref 39.0–52.0)
Hemoglobin: 12.3 g/dL — ABNORMAL LOW (ref 13.0–17.0)
Immature Granulocytes: 0 %
Lymphocytes Relative: 39 %
Lymphs Abs: 1.4 10*3/uL (ref 0.7–4.0)
MCH: 30.8 pg (ref 26.0–34.0)
MCHC: 32.6 g/dL (ref 30.0–36.0)
MCV: 94.3 fL (ref 80.0–100.0)
Monocytes Absolute: 0.4 10*3/uL (ref 0.1–1.0)
Monocytes Relative: 10 %
Neutro Abs: 1.7 10*3/uL (ref 1.7–7.7)
Neutrophils Relative %: 47 %
Platelets: 261 10*3/uL (ref 150–400)
RBC: 4 MIL/uL — ABNORMAL LOW (ref 4.22–5.81)
RDW: 14.5 % (ref 11.5–15.5)
WBC: 3.5 10*3/uL — ABNORMAL LOW (ref 4.0–10.5)
nRBC: 0 % (ref 0.0–0.2)

## 2022-12-11 LAB — BASIC METABOLIC PANEL
Anion gap: 13 (ref 5–15)
BUN: 11 mg/dL (ref 6–20)
CO2: 20 mmol/L — ABNORMAL LOW (ref 22–32)
Calcium: 8.4 mg/dL — ABNORMAL LOW (ref 8.9–10.3)
Chloride: 104 mmol/L (ref 98–111)
Creatinine, Ser: 1.07 mg/dL (ref 0.61–1.24)
GFR, Estimated: >60 mL/min (ref >60)
Glucose, Bld: 85 mg/dL (ref 70–99)
Potassium: 3.2 mmol/L — ABNORMAL LOW (ref 3.5–5.1)
Sodium: 137 mmol/L (ref 135–145)

## 2022-12-11 LAB — TROPONIN I (HIGH SENSITIVITY): Troponin I (High Sensitivity): 8 ng/L (ref <18)

## 2022-12-11 MED ORDER — OXYCODONE HCL 5 MG PO TABS: 5.0000 mg | ORAL_TABLET | ORAL | Status: DC | PRN

## 2022-12-11 MED ORDER — MORPHINE SULFATE (PF) 2 MG/ML IV SOLN: 2.0000 mg | INTRAVENOUS | Status: DC | PRN

## 2022-12-11 MED ORDER — POTASSIUM CHLORIDE IN NACL 20-0.9 MEQ/L-% IV SOLN
INTRAVENOUS | Status: DC
  Filled 2022-12-11: qty 1000

## 2022-12-11 NOTE — ED Notes (Signed)
Pt states that after sleeping, he is no longer having thoughts of suicide. Pt provided with resources for follow up care. Bus pass also provided.

## 2022-12-11 NOTE — ED Triage Notes (Signed)
Pt states he has been having chest pressure d/t stress and having suicidal ideations and "has a gun to carry it out".  Pt states he uses all drugs and last drank approx 2 hours ago.

## 2022-12-11 NOTE — ED Triage Notes (Signed)
Patient with reported complaints of chest pressure.  He has hx of running out of his meds.  He admits to having some ETOH today.  Patient bp was wnl with ems tonight.  EKG with sinus tach.  Patient did receive aspirin 324 prior to arrival.  He is alert and oriented.

## 2022-12-11 NOTE — ED Provider Triage Note (Signed)
Emergency Medicine Provider Triage Evaluation Note  Jonahs Heckman , a 42 y.o. male  was evaluated in triage.  Pt complains of chest pain from "all the stress". Given 316m ASA by EMS en route. When asked when he last used illicit drugs he states he used "all of them" last 2 days ago. Endorses ETOH use today. Complains of ongoing psychiatric complaints because "no one is willing to help".  Review of Systems  Positive: As above Negative: As above  Physical Exam  There were no vitals taken for this visit. Gen:   Awake, no distress   Resp:  Normal effort  MSK:   Moves extremities without difficulty  Other:  Mild agitation  Medical Decision Making  Medically screening exam initiated at 2:03 AM.  Appropriate orders placed.  WKelton PillarBPowleswas informed that the remainder of the evaluation will be completed by another provider, this initial triage assessment does not replace that evaluation, and the importance of remaining in the ED until their evaluation is complete.  Nonspecific chest pain - work up initiated   HAntonietta Breach PVermont02/22/24 [id number]

## 2022-12-11 NOTE — ED Provider Notes (Signed)
[facility] Provider Note  CSN: [id number] Arrival date & time: 12/11/22 0157  Chief Complaint(s) Chest Pain and Suicidal  HPI William Powers is a 42 y.o. male with a past medical history listed below who presents to the emergency department for 3 days of constant chest pain.  Pain is central and aching.  Nonradiating nonexertional.  Worse with coughing.  No shortness of breath.  No nausea or vomiting.  No abdominal pain.  Patient endorses continued alcohol use.  Denies any other illicit drug use.  Patient also states that he has suicidal ideations.  Patient is well-known to the emergency department for frequent visits for similar presentations.  This is his 11 ER visit to this in the surrounding areas in the last 2 months.  Patient was just seen in [facility] emergency department for suicidal ideation and cleared by psychiatry 1 day ago.  The history is provided by the patient.    Past Medical History Past Medical History:  Diagnosis Date   Heart failure ([place])    HFrEF (heart failure with reduced ejection fraction) ([facility])    15% EF as of 2019   HIV (human immunodeficiency virus infection) ([place])    Hypertension    Polysubstance abuse ([place])    Pulmonary embolism ([place])    Single subsegmental pulmonary embolism without acute cor pulmonale ([place]) 07/23/2021   Patient Active Problem List   Diagnosis Date Noted   Dental caries 09/13/2022   Substance abuse ([place]) 05/03/2022   Polysubstance abuse ([place]) 04/29/2022   Alcohol use disorder, severe, dependence ([place]) 03/16/2022   Recurrent major depression-severe ([place]) 02/23/2022   Alcohol abuse    Vitamin B1 deficiency 09/19/2021   Adjustment disorder with depressed mood    History of CHF (congestive heart failure) 08/07/2021   H/O ETOH abuse 08/07/2021   Chronic HFrEF (heart failure with reduced ejection fraction) ([place]) 07/23/2021   HIV (human immunodeficiency virus infection) ([place])  07/23/2021   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 Authorizing Provider  Potassium Chloride ER 20 MEQ TBCR Take 20 mEq by mouth daily at 6 (six) AM. Patient not taking: No sig reported 08/07/21 09/05/21  Reubin Milan, MD                                                                                                                                    Allergies Bee venom, Lisinopril, and Sulfamethoxazole-trimethoprim  Review of Systems Review of Systems As noted in HPI  Physical Exam Vital Signs  I have reviewed the triage vital signs BP 103/83   Pulse (!) 110   Temp 98.6 F (37 C) (Oral)   Resp 17   Ht 6' 4"$  (1.93  m)   Wt 113 kg   SpO2 98%   BMI 30.32 kg/m   Physical Exam Vitals reviewed.  Constitutional:      General: He is not in acute distress.    Appearance: He is well-developed. He is not diaphoretic.  HENT:     Head: Normocephalic and atraumatic.     Nose: Nose normal.  Eyes:     General: No scleral icterus.       Right eye: No discharge.        Left eye: No discharge.     Conjunctiva/sclera: Conjunctivae normal.     Pupils: Pupils are equal, round, and reactive to light.  Cardiovascular:     Rate and Rhythm: Normal rate and regular rhythm.     Heart sounds: No murmur heard.    No friction rub. No gallop.  Pulmonary:     Effort: Pulmonary effort is normal. No respiratory distress.     Breath sounds: Normal breath sounds. No stridor. No rales.  Abdominal:     General: There is no distension.     Palpations: Abdomen is soft.     Tenderness: There is no abdominal tenderness.  Musculoskeletal:        General: No tenderness.     Cervical back: Normal range of motion and neck supple.  Skin:    General: Skin is warm and dry.      Findings: No erythema or rash.  Neurological:     Mental Status: He is alert and oriented to person, place, and time.     ED Results and Treatments Labs (all labs ordered are listed, but only abnormal results are displayed) Labs Reviewed  CBC WITH DIFFERENTIAL/PLATELET - Abnormal; Notable for the following components:      Result Value   WBC 3.5 (*)    RBC 4.00 (*)    Hemoglobin 12.3 (*)    HCT 37.7 (*)    All other components within normal limits  BASIC METABOLIC PANEL - Abnormal; Notable for the following components:   Potassium 3.2 (*)    CO2 20 (*)    Calcium 8.4 (*)    All other components within normal limits  RAPID URINE DRUG SCREEN, HOSP PERFORMED  TROPONIN I (HIGH SENSITIVITY)                                                                                                                         EKG Stroke 15  Radiology DG Chest Port 1 View  Result Date: 12/11/2022 CLINICAL DATA:  42 year old male with cough, chest pain cough. EXAM: PORTABLE CHEST 1 VIEW COMPARISON:  Chest radiographs 12/03/2022 and earlier. FINDINGS: Portable AP semi upright view at 0405 hours. Lung volumes and mediastinal contours are within normal limits. Visualized tracheal air column is within normal limits. Allowing for portable technique the lungs are clear. No pneumothorax or pleural effusion. No osseous abnormality identified. Paucity of bowel gas in the upper abdomen. IMPRESSION: Negative portable chest. Electronically Signed  By: Genevie Ann M.D.   On: 12/11/2022 04:15    Medications Ordered in ED Medications - No data to display                                                                                                                                    Procedures ED EKG  Date/Time: 12/11/2022 6:10 AM  Performed by: Fatima Blank, MD Authorized by: Fatima Blank, MD   ECG interpreted by ED Physician in the absence of a cardiologist: yes   Previous ECG:    Previous  ECG:  Compared to current   Similarity:  No change Rate:    ECG rate:  108   ECG rate assessment: tachycardic   Rhythm:    Rhythm: sinus rhythm   Ectopy:    Ectopy: none   QRS:    QRS axis:  Normal ST segments:    ST segments:  Normal T waves:    T waves: non-specific     Details:  Similar to prior tracings   (including critical care time)  Medical Decision Making / ED Course   Medical Decision Making Amount and/or Complexity of Data Reviewed Labs: ordered. Decision-making details documented in ED Course. Radiology: ordered and independent interpretation performed. Decision-making details documented in ED Course. ECG/medicine tests: ordered and independent interpretation performed. Decision-making details documented in ED Course.    EKG without acute ischemic changes or evidence of pericarditis. CBC without leukocytosis or anemia. Metabolic panel with mild hypokalemia and no other significant electrolyte derangements or renal sufficiency. Trop negative. With atypical chest pain constant for 3 days, feel this is sufficient to rule out ACS. Chest x-ray without evidence of pneumonia, pneumothorax, pulmonary edema or pleural effusion. Doubt heart failure exacerbation. Of low suspicion for pulmonary embolism.  Suicidal ideation.  Similar presentations in the past.  No active plan.  Recently cleared by psychiatry.  Do not feel patient requires inpatient admission for IVC at this time.       Final Clinical Impression(s) / ED Diagnoses Final diagnoses:  Chest discomfort  Suicidal ideations   The patient appears reasonably screened and/or stabilized for discharge and I doubt any other medical condition or other [facility] requiring further screening, evaluation, or treatment in the ED at this time. I have discussed the findings, Dx and Tx plan with the patient/family who expressed understanding and agree(s) with the plan. Discharge instructions discussed at length. The patient/family  was given strict return precautions who verbalized understanding of the instructions. No further questions at time of discharge.  Disposition: Discharge  Condition: Good  ED Discharge Orders     None         Follow Up: [facility] Address: [address] [place], [place] [address] Hours: Open 24 hours Monday through Sunday Phone: [phone] Go to  as needed           This chart was  dictated using voice recognition software.  Despite best efforts to proofread,  errors can occur which can change the documentation meaning.    Fatima Blank, MD 12/11/22 [phone]

## 2022-12-13 ENCOUNTER — Other Ambulatory Visit: Payer: Self-pay

## 2022-12-13 ENCOUNTER — Ambulatory Visit (HOSPITAL_COMMUNITY)
Admission: EM | Admit: 2022-12-13 | Discharge: 2022-12-13 | Disposition: A | Discharge status: Home / Self Care | Payer: No Typology Code available for payment source | Attending: Urology | Admitting: Urology

## 2022-12-13 ENCOUNTER — Encounter (HOSPITAL_COMMUNITY): Payer: Self-pay | Admitting: Psychiatry

## 2022-12-13 ENCOUNTER — Encounter (HOSPITAL_COMMUNITY): Payer: Self-pay | Admitting: Emergency Medicine

## 2022-12-13 ENCOUNTER — Emergency Department (HOSPITAL_COMMUNITY)
Admission: EM | Admit: 2022-12-13 | Discharge: 2022-12-14 | Disposition: A | Discharge status: Home / Self Care | Payer: No Typology Code available for payment source | Attending: Emergency Medicine | Admitting: Emergency Medicine

## 2022-12-13 DIAGNOSIS — Z1152 Encounter for screening for COVID-19: Secondary | ICD-10-CM | POA: Insufficient documentation

## 2022-12-13 DIAGNOSIS — R799 Abnormal finding of blood chemistry, unspecified: Secondary | ICD-10-CM | POA: Insufficient documentation

## 2022-12-13 DIAGNOSIS — I110 Hypertensive heart disease with heart failure: Secondary | ICD-10-CM | POA: Diagnosis not present

## 2022-12-13 DIAGNOSIS — I509 Heart failure, unspecified: Secondary | ICD-10-CM | POA: Diagnosis not present

## 2022-12-13 DIAGNOSIS — F1020 Alcohol dependence, uncomplicated: Secondary | ICD-10-CM | POA: Insufficient documentation

## 2022-12-13 DIAGNOSIS — F10920 Alcohol use, unspecified with intoxication, uncomplicated: Secondary | ICD-10-CM

## 2022-12-13 DIAGNOSIS — F4321 Adjustment disorder with depressed mood: Secondary | ICD-10-CM | POA: Insufficient documentation

## 2022-12-13 DIAGNOSIS — F1910 Other psychoactive substance abuse, uncomplicated: Secondary | ICD-10-CM

## 2022-12-13 DIAGNOSIS — Z21 Asymptomatic human immunodeficiency virus [HIV] infection status: Secondary | ICD-10-CM | POA: Insufficient documentation

## 2022-12-13 DIAGNOSIS — R45851 Suicidal ideations: Secondary | ICD-10-CM | POA: Insufficient documentation

## 2022-12-13 DIAGNOSIS — F1090 Alcohol use, unspecified, uncomplicated: Secondary | ICD-10-CM | POA: Diagnosis present

## 2022-12-13 DIAGNOSIS — Y906 Blood alcohol level of 120-199 mg/100 ml: Secondary | ICD-10-CM | POA: Diagnosis not present

## 2022-12-13 DIAGNOSIS — F1410 Cocaine abuse, uncomplicated: Secondary | ICD-10-CM | POA: Insufficient documentation

## 2022-12-13 LAB — COMPREHENSIVE METABOLIC PANEL
ALT: 29 U/L (ref 0–44)
AST: 36 U/L (ref 15–41)
Albumin: 3.4 g/dL — ABNORMAL LOW (ref 3.5–5.0)
Alkaline Phosphatase: 56 U/L (ref 38–126)
Anion gap: 13 (ref 5–15)
BUN: 10 mg/dL (ref 6–20)
CO2: 20 mmol/L — ABNORMAL LOW (ref 22–32)
Calcium: 8.6 mg/dL — ABNORMAL LOW (ref 8.9–10.3)
Chloride: 104 mmol/L (ref 98–111)
Creatinine, Ser: 0.91 mg/dL (ref 0.61–1.24)
GFR, Estimated: >60 mL/min (ref >60)
Glucose, Bld: 85 mg/dL (ref 70–99)
Potassium: 3.4 mmol/L — ABNORMAL LOW (ref 3.5–5.1)
Sodium: 137 mmol/L (ref 135–145)
Total Bilirubin: 0.7 mg/dL (ref 0.3–1.2)
Total Protein: 8.1 g/dL (ref 6.5–8.1)

## 2022-12-13 LAB — CBC WITH DIFFERENTIAL/PLATELET
Abs Immature Granulocytes: 0.01 10*3/uL (ref 0.00–0.07)
Basophils Absolute: 0 10*3/uL (ref 0.0–0.1)
Basophils Relative: 0 %
Eosinophils Absolute: 0.1 10*3/uL (ref 0.0–0.5)
Eosinophils Relative: 3 %
HCT: 39.1 % (ref 39.0–52.0)
Hemoglobin: 12.9 g/dL — ABNORMAL LOW (ref 13.0–17.0)
Immature Granulocytes: 0 %
Lymphocytes Relative: 34 %
Lymphs Abs: 1.3 10*3/uL (ref 0.7–4.0)
MCH: 30.9 pg (ref 26.0–34.0)
MCHC: 33 g/dL (ref 30.0–36.0)
MCV: 93.5 fL (ref 80.0–100.0)
Monocytes Absolute: 0.3 10*3/uL (ref 0.1–1.0)
Monocytes Relative: 8 %
Neutro Abs: 2 10*3/uL (ref 1.7–7.7)
Neutrophils Relative %: 55 %
Platelets: 268 10*3/uL (ref 150–400)
RBC: 4.18 MIL/uL — ABNORMAL LOW (ref 4.22–5.81)
RDW: 14.5 % (ref 11.5–15.5)
WBC: 3.7 10*3/uL — ABNORMAL LOW (ref 4.0–10.5)
nRBC: 0 % (ref 0.0–0.2)

## 2022-12-13 LAB — POCT URINE DRUG SCREEN - MANUAL ENTRY (I-SCREEN)
POC Amphetamine UR: NOT DETECTED
POC Buprenorphine (BUP): NOT DETECTED
POC Cocaine UR: NOT DETECTED
POC Marijuana UR: NOT DETECTED
POC Methadone UR: NOT DETECTED
POC Methamphetamine UR: NOT DETECTED
POC Morphine: NOT DETECTED
POC Oxazepam (BZO): NOT DETECTED
POC Oxycodone UR: NOT DETECTED
POC Secobarbital (BAR): NOT DETECTED

## 2022-12-13 LAB — POC SARS CORONAVIRUS 2 AG: SARSCOV2ONAVIRUS 2 AG: NEGATIVE

## 2022-12-13 LAB — RESP PANEL BY RT-PCR (RSV, FLU A&B, COVID)  RVPGX2
Influenza A by PCR: NEGATIVE
Influenza B by PCR: NEGATIVE
Resp Syncytial Virus by PCR: NEGATIVE
SARS Coronavirus 2 by RT PCR: NEGATIVE

## 2022-12-13 LAB — CBG MONITORING, ED: Glucose-Capillary: 85 mg/dL (ref 70–99)

## 2022-12-13 LAB — ETHANOL: Alcohol, Ethyl (B): 136 mg/dL — ABNORMAL HIGH (ref <10)

## 2022-12-13 MED ORDER — ONDANSETRON 4 MG PO TBDP: 4.0000 mg | ORAL_TABLET | Freq: Four times a day (QID) | ORAL | Status: DC | PRN

## 2022-12-13 MED ORDER — ACETAMINOPHEN 325 MG PO TABS: 650.0000 mg | ORAL_TABLET | Freq: Four times a day (QID) | ORAL | Status: DC | PRN

## 2022-12-13 MED ORDER — THIAMINE MONONITRATE 100 MG PO TABS: 100.0000 mg | ORAL_TABLET | Freq: Every day | ORAL | Status: DC

## 2022-12-13 MED ORDER — LORAZEPAM 1 MG PO TABS: 1.0000 mg | ORAL_TABLET | Freq: Four times a day (QID) | ORAL | Status: DC | PRN

## 2022-12-13 MED ORDER — THIAMINE HCL 100 MG/ML IJ SOLN
100.0000 mg | Freq: Once | INTRAMUSCULAR | Status: DC
  Filled 2022-12-13: qty 2

## 2022-12-13 MED ORDER — ALUM & MAG HYDROXIDE-SIMETH 200-200-20 MG/5ML PO SUSP: 30.0000 mL | ORAL | Status: DC | PRN

## 2022-12-13 MED ORDER — TRAZODONE HCL 50 MG PO TABS
50.0000 mg | ORAL_TABLET | Freq: Every evening | ORAL | Status: DC | PRN
  Filled 2022-12-13: qty 1

## 2022-12-13 MED ORDER — MAGNESIUM HYDROXIDE 400 MG/5ML PO SUSP: 30.0000 mL | Freq: Every day | ORAL | Status: DC | PRN

## 2022-12-13 MED ORDER — ADULT MULTIVITAMIN W/MINERALS CH: 1.0000 | ORAL_TABLET | Freq: Every day | ORAL | Status: DC

## 2022-12-13 MED ORDER — HYDROXYZINE HCL 25 MG PO TABS: 25.0000 mg | ORAL_TABLET | Freq: Four times a day (QID) | ORAL | Status: DC | PRN

## 2022-12-13 MED ORDER — LOPERAMIDE HCL 2 MG PO CAPS: 2.0000 mg | ORAL_CAPSULE | ORAL | Status: DC | PRN

## 2022-12-13 NOTE — ED Notes (Signed)
Patient requested juice for breakfast. Patient declined food. Patient is calm and cooperative. Patient is safe on unit with continued monitoring.

## 2022-12-13 NOTE — ED Triage Notes (Signed)
Pt presents to [facility] voluntarily, accompanied by [employer] with complaint of SI/HI. Pt is intoxicated and reports drinking a few pints of liquor and additional shots of tequila (unable to remember specifically). Pt stated " I'm just going to kill myself so I don't hurt anybody". Pt then endorses homicidal ideation towards a man at the in the community tonight, he says he wants to shoot the man. He refused to say if he has access to a weapon/gun. Pt laughing and joking inappropriately. Pt denies AVH.

## 2022-12-13 NOTE — ED Notes (Signed)
Pt evaluated by provider.  Pt denies SI,HI or AVH at this time

## 2022-12-13 NOTE — ED Triage Notes (Signed)
Pt BIB EMS from [facility] after he experienced lock jaw and shaky hands while smoking a cigarette. Per pt, he had to be "smacked back to life." Pt states he's an alcoholic, and his last drink was an hour ago when the episode happened.

## 2022-12-13 NOTE — ED Provider Triage Note (Signed)
Emergency Medicine Provider Triage Evaluation Note  William Powers , a 42 y.o. male  was evaluated in triage.  Pt complains of lock jaw and shaking tonight while at a gas station. Was aware of these symptoms "in the beginning". Has drank 3 pints of ETOH today, per patient. Last drink was ~1 hour ago. Asking for a snack because his "sugar is low".  Review of Systems  Positive: As above Negative: As above  Physical Exam  BP (!) 142/88   Pulse 94   Temp 98.4 F (36.9 C)   Resp 18   Ht '6\' 4"'$  (1.93 m)   Wt 113 kg   SpO2 98%   BMI 30.32 kg/m  Gen:   Awake, no distress   Resp:  Normal effort  MSK:   Moves extremities without difficulty  Other:  GCS 15. Speech is goal oriented. No deficits appreciated to CN III-XII; symmetric eyebrow raise, no facial drooping, tongue midline. Patient moves extremities without ataxia. Patient ambulatory with steady gait.   Medical Decision Making  Medically screening exam initiated at 11:33 PM.  Appropriate orders placed.  William Powers was informed that the remainder of the evaluation will be completed by another provider, this initial triage assessment does not replace that evaluation, and the importance of remaining in the ED until their evaluation is complete.  AMS - well known to ED. Appears at baseline compared to prior visits. Labs initiated. No tremors or other signs of acute ETOH withdrawal at present.   Antonietta Breach, PA-C 12/13/22 2337

## 2022-12-13 NOTE — ED Provider Notes (Signed)
[facility] Continuous Assessment Admission H&P  Date: 12/13/22 Patient Name: William Powers MRN: [id number] Chief Complaint: " I am having suicidal thoughts  Diagnoses:  Final diagnoses:  None    HPI: William Powers is a 42 year old male with psychiatric history of depression, polysubstance abuse (methamphetamine, cocaine, fentanyl), and suicidal ideation.  Patient was brought to [facility] voluntarily by law enforcement reporting suicidal ideation, homicidal ideation, and requesting alcohol detox.  Patient was seen face-to-face and his chart was reviewed by this nurse practitioner.   Patient reported he got into a verbal altercation with man prior to this assessment. He says he initially had homicidal thoughts towards the man but started having suicidal ideation because he did not want to harm anyone.  He denies current homicidal ideation but continues to endorse suicidal ideation with plan to shoot himself.  Patient says that he has access to gun.  He says that he is depressed due to ongoing alcohol abuse and financial difficulties. Patient reports that he drinks 2 pints of liquor daily. He says he is interested in alcohol detox and residential alcohol rehab treatment program. He denies history of alcohol withdrawal symptoms including withdrawal seizure and delirium tremors.  Patient denies hallucination and paranoia.  Patient denies all other substance use.  UDS is pending.  On assessment, patient seen laying on the chair in the assessment room. He smelled of alcohol however is alert and oriented x 4 and able to fully participate in assessment.  Patient is in no apparent distress. He is speaking in a clear tone of voice at a moderate rate with good eye contact.  Patient's mood is depressed with a congruent affect. Patient's thought process is coherent.  Patient did not appear to be responding to any internal/external stimuli. Patient appears to have some delusions surrounding his state of  health. Patient believes that he was cured of HIV and no longer has to take medication for HIV and his other health problems.   He says he discontinued all his medications for HTN, HIV, and psychotropic in 2022 as he believes he is healthy. He says his doctor told him he no longer has HIV. This provider attempted to educate patient and offered to repeat HIV testing but patient refused.   ---patient will need further education about the importance of medication adherence and education about HIV disease process when is sober. Patient may benefit from referral to ID clinic.   Per chart review ([place] 06/19/2022):   Attempted to reach Mr. Barna by phone [phone] after an ED Visit to follow up and inquire if we can connect him to care for HIV. Last visit Dr. Quay Burow , last VL UD 05/09/2019. If he presents to the emergency department, please page Dr. Verne Grain [phone]. Please leave the following in his discharge patient information: We would like to see you again in the Infectious Diseases Clinic 1K. Please call [phone]. Alert our office you were previously seen by Dr. Trecia Rogers and would like to schedule an appointment. We look forward to making sure you have the medication you need and have access to the resources offered by our clinic.       Total Time spent with patient: 20 minutes  Musculoskeletal  Strength & Muscle Tone: within normal limits Gait & Station: normal Patient leans: Right  Psychiatric Specialty Exam  Presentation General Appearance:  Appropriate for Environment  Eye Contact: Good  Speech: Clear and Coherent  Speech Volume: Normal  Handedness: Right  Mood and Affect  Mood: Depressed  Affect: Congruent   Thought Process  Thought Processes: Coherent  Descriptions of Associations:Intact  Orientation:Full (Time, Place and Person)  Thought Content:Logical  Diagnosis of Schizophrenia or Schizoaffective disorder in  past: No   Hallucinations:Hallucinations: None  Ideas of Reference:None  Suicidal Thoughts:Suicidal Thoughts: Yes, Active SI Active Intent and/or Plan: With Plan  Homicidal Thoughts:Homicidal Thoughts: Yes, Active HI Active Intent and/or Plan: With Plan   Sensorium  Memory: Immediate Good; Recent Fair; Remote Fair  Judgment: Fair  Insight: Good   Executive Functions  Concentration: Good  Attention Span: Good  Recall: Good  Fund of Knowledge: Good  Language: Good   Psychomotor Activity  Psychomotor Activity: Psychomotor Activity: Normal   Assets  Assets: Communication Skills; Desire for Improvement; Physical Health   Sleep  Sleep: Sleep: Fair Number of Hours of Sleep: 5   Nutritional Assessment (For OBS and FBC admissions only) Has the patient had a weight loss or gain of 10 pounds or more in the last 3 months?: No Has the patient had a decrease in food intake/or appetite?: No Does the patient have dental problems?: No Does the patient have eating habits or behaviors that may be indicators of an eating disorder including binging or inducing vomiting?: No Has the patient recently lost weight without trying?: 0 Has the patient been eating poorly because of a decreased appetite?: 0 Malnutrition Screening Tool Score: 0    Physical Exam Vitals and nursing note reviewed.  Constitutional:      General: He is not in acute distress.    Appearance: He is well-developed. He is not ill-appearing.  HENT:     Head: Normocephalic and atraumatic.  Eyes:     Conjunctiva/sclera: Conjunctivae normal.  Cardiovascular:     Rate and Rhythm: Normal rate.     Heart sounds: No murmur heard. Pulmonary:     Effort: Pulmonary effort is normal. No respiratory distress.     Breath sounds: Normal breath sounds.  Abdominal:     Palpations: Abdomen is soft.     Tenderness: There is no abdominal tenderness.  Musculoskeletal:        General: No swelling. Normal  range of motion.     Cervical back: Normal range of motion.  Skin:    General: Skin is warm and dry.     Capillary Refill: Capillary refill takes less than 2 seconds.  Neurological:     Mental Status: He is alert and oriented to person, place, and time.  Psychiatric:        Attention and Perception: Attention and perception normal.        Mood and Affect: Mood is depressed.        Speech: Speech normal.        Behavior: Behavior normal. Behavior is cooperative.        Thought Content: Thought content is delusional. Thought content is not paranoid. Thought content includes suicidal ideation. Thought content does not include homicidal ideation. Thought content includes suicidal plan. Thought content does not include homicidal plan.    Review of Systems  Constitutional: Negative.   HENT: Negative.    Eyes: Negative.   Respiratory: Negative.    Cardiovascular: Negative.   Gastrointestinal: Negative.   Genitourinary: Negative.   Musculoskeletal: Negative.   Skin: Negative.   Neurological: Negative.   Endo/Heme/Allergies: Negative.   Psychiatric/Behavioral:  Positive for depression, substance abuse and suicidal ideas. The patient is nervous/anxious.     Blood pressure 115/73, pulse Marland Kitchen)  111, temperature 98.7 F (37.1 C), temperature source Oral, resp. rate 20, SpO2 99 %. There is no height or weight on file to calculate BMI.  Past Psychiatric History: Polysubstance abuse, depression, suicidal ideation  Is the patient at risk to self? Yes  Has the patient been a risk to self in the past 6 months? Yes .    Has the patient been a risk to self within the distant past? Yes   Is the patient a risk to others? No   Has the patient been a risk to others in the past 6 months? Yes   Has the patient been a risk to others within the distant past? Yes   Past Medical History:  Past Medical History:  Diagnosis Date   Heart failure (HCC)    HFrEF (heart failure with reduced ejection fraction)  (HCC)    15% EF as of 2019   HIV (human immunodeficiency virus infection) ([place])    Hypertension    Polysubstance abuse ([place])    Pulmonary embolism ([place])    Single subsegmental pulmonary embolism without acute cor pulmonale ([place]) 07/23/2021    Family History:  Family History  Problem Relation Age of Onset   Lupus Mother    HIV Mother    HIV Father      Social History:  Social History   Tobacco Use   Smoking status: Some Days    Packs/day: 0.50    Types: Cigarettes, Cigars   Smokeless tobacco: Never  Vaping Use   Vaping Use: Never used  Substance Use Topics   Alcohol use: Yes    Comment: 10 pints per week   Drug use: Yes    Types: Marijuana, Cocaine     Last Labs:  Admission on 12/13/2022  Component Date Value Ref Range Status   SARSCOV2ONAVIRUS 2 AG 12/13/2022 NEGATIVE  NEGATIVE Final   Comment: (NOTE) SARS-CoV-2 antigen NOT DETECTED.   Negative results are presumptive.  Negative results do not preclude SARS-CoV-2 infection and should not be used as the sole basis for treatment or other patient management decisions, including infection  control decisions, particularly in the presence of clinical signs and  symptoms consistent with COVID-19, or in those who have been in contact with the virus.  Negative results must be combined with clinical observations, patient history, and epidemiological information. The expected result is Negative.  Fact Sheet for Patients: HandmadeRecipes.com.cy  Fact Sheet for Healthcare Providers: FuneralLife.at  This test is not yet approved or cleared by the [place] FDA and  has been authorized for detection and/or diagnosis of SARS-CoV-2 by FDA under an Emergency Use Authorization (EUA).  This EUA will remain in effect (meaning this test can be used) for the duration of  the COV                          ID-19 declaration under Section 564(b)(1) of the Act, 21 U.S.C. section  360bbb-3(b)(1), unless the authorization is terminated or revoked sooner.    Admission on 12/11/2022, Discharged on 12/11/2022  Component Date Value Ref Range Status   Troponin I (High Sensitivity) 12/11/2022 8  <18 ng/L Final   Comment: (NOTE) Elevated high sensitivity troponin I (hsTnI) values and significant  changes across serial measurements may suggest ACS but many other  chronic and acute conditions are known to elevate hsTnI results.  Refer to the "Links" section for chest pain algorithms and additional  guidance. Performed at [facility],  [address] [place], [place] [address]    WBC 12/11/2022 3.5 (L)  4.0 - 10.5 K/uL Final   RBC 12/11/2022 4.00 (L)  4.22 - 5.81 MIL/uL Final   Hemoglobin 12/11/2022 12.3 (L)  13.0 - 17.0 g/dL Final   HCT 12/11/2022 37.7 (L)  39.0 - 52.0 % Final   MCV 12/11/2022 94.3  80.0 - 100.0 fL Final   MCH 12/11/2022 30.8  26.0 - 34.0 pg Final   MCHC 12/11/2022 32.6  30.0 - 36.0 g/dL Final   RDW 12/11/2022 14.5  11.5 - 15.5 % Final   Platelets 12/11/2022 261  150 - 400 K/uL Final   nRBC 12/11/2022 0.0  0.0 - 0.2 % Final   Neutrophils Relative % 12/11/2022 47  % Final   Neutro Abs 12/11/2022 1.7  1.7 - 7.7 K/uL Final   Lymphocytes Relative 12/11/2022 39  % Final   Lymphs Abs 12/11/2022 1.4  0.7 - 4.0 K/uL Final   Monocytes Relative 12/11/2022 10  % Final   Monocytes Absolute 12/11/2022 0.4  0.1 - 1.0 K/uL Final   Eosinophils Relative 12/11/2022 3  % Final   Eosinophils Absolute 12/11/2022 0.1  0.0 - 0.5 K/uL Final   Basophils Relative 12/11/2022 1  % Final   Basophils Absolute 12/11/2022 0.0  0.0 - 0.1 K/uL Final   Immature Granulocytes 12/11/2022 0  % Final   Abs Immature Granulocytes 12/11/2022 0.01  0.00 - 0.07 K/uL Final   Performed at [facility], [place] [address] [place], [place] [address]   Sodium 12/11/2022 137  135 - 145 mmol/L Final   Potassium 12/11/2022 3.2 (L)  3.5 - 5.1 mmol/L Final   Chloride 12/11/2022 104  98 -  111 mmol/L Final   CO2 12/11/2022 20 (L)  22 - 32 mmol/L Final   Glucose, Bld 12/11/2022 85  70 - 99 mg/dL Final   Glucose reference range applies only to samples taken after fasting for at least 8 hours.   BUN 12/11/2022 11  6 - 20 mg/dL Final   Creatinine, Ser 12/11/2022 1.07  0.61 - 1.24 mg/dL Final   Calcium 12/11/2022 8.4 (L)  8.9 - 10.3 mg/dL Final   GFR, Estimated 12/11/2022 >60  >60 mL/min Final   Comment: (NOTE) Calculated using the CKD-EPI Creatinine Equation (2021)    Anion gap 12/11/2022 13  5 - 15 Final   Performed at [facility], [place] [address], [place], [place] [address]  Admission on 12/06/2022, Discharged on 12/06/2022  Component Date Value Ref Range Status   SARS Coronavirus 2 by RT PCR 12/06/2022 NEGATIVE  NEGATIVE Final   Influenza A by PCR 12/06/2022 NEGATIVE  NEGATIVE Final   Influenza B by PCR 12/06/2022 NEGATIVE  NEGATIVE Final   Comment: (NOTE) The Xpert Xpress SARS-CoV-2/FLU/RSV plus assay is intended as an aid in the diagnosis of influenza from Nasopharyngeal swab specimens and should not be used as a sole basis for treatment. Nasal washings and aspirates are unacceptable for Xpert Xpress SARS-CoV-2/FLU/RSV testing.  Fact Sheet for Patients: EntrepreneurPulse.com.au  Fact Sheet for Healthcare Providers: IncredibleEmployment.be  This test is not yet approved or cleared by the [place] FDA and has been authorized for detection and/or diagnosis of SARS-CoV-2 by FDA under an Emergency Use Authorization (EUA). This EUA will remain in effect (meaning this test can be used) for the duration of the COVID-19 declaration under Section 564(b)(1) of the Act, 21 U.S.C. section 360bbb-3(b)(1), unless the authorization is terminated or revoked.  Resp Syncytial Virus by PCR 12/06/2022 NEGATIVE  NEGATIVE Final   Comment: (NOTE) Fact Sheet for Patients: EntrepreneurPulse.com.au  Fact Sheet for  Healthcare Providers: IncredibleEmployment.be  This test is not yet approved or cleared by the [place] FDA and has been authorized for detection and/or diagnosis of SARS-CoV-2 by FDA under an Emergency Use Authorization (EUA). This EUA will remain in effect (meaning this test can be used) for the duration of the COVID-19 declaration under Section 564(b)(1) of the Act, 21 U.S.C. section 360bbb-3(b)(1), unless the authorization is terminated or revoked.  Performed at [facility], [place] [address] [place], [place] [address]    WBC 12/06/2022 4.8  4.0 - 10.5 K/uL Final   RBC 12/06/2022 4.34  4.22 - 5.81 MIL/uL Final   Hemoglobin 12/06/2022 13.5  13.0 - 17.0 g/dL Final   HCT 12/06/2022 39.4  39.0 - 52.0 % Final   MCV 12/06/2022 90.8  80.0 - 100.0 fL Final   MCH 12/06/2022 31.1  26.0 - 34.0 pg Final   MCHC 12/06/2022 34.3  30.0 - 36.0 g/dL Final   RDW 12/06/2022 14.1  11.5 - 15.5 % Final   Platelets 12/06/2022 285  150 - 400 K/uL Final   nRBC 12/06/2022 0.0  0.0 - 0.2 % Final   Neutrophils Relative % 12/06/2022 47  % Final   Neutro Abs 12/06/2022 2.3  1.7 - 7.7 K/uL Final   Lymphocytes Relative 12/06/2022 42  % Final   Lymphs Abs 12/06/2022 2.0  0.7 - 4.0 K/uL Final   Monocytes Relative 12/06/2022 9  % Final   Monocytes Absolute 12/06/2022 0.4  0.1 - 1.0 K/uL Final   Eosinophils Relative 12/06/2022 2  % Final   Eosinophils Absolute 12/06/2022 0.1  0.0 - 0.5 K/uL Final   Basophils Relative 12/06/2022 0  % Final   Basophils Absolute 12/06/2022 0.0  0.0 - 0.1 K/uL Final   Immature Granulocytes 12/06/2022 0  % Final   Abs Immature Granulocytes 12/06/2022 0.01  0.00 - 0.07 K/uL Final   Performed at [facility], [place] [address] [place], [place] [address]   Sodium 12/06/2022 139  135 - 145 mmol/L Final   Potassium 12/06/2022 3.4 (L)  3.5 - 5.1 mmol/L Final   Chloride 12/06/2022 104  98 - 111 mmol/L Final   CO2 12/06/2022 22  22 - 32 mmol/L Final    Glucose, Bld 12/06/2022 93  70 - 99 mg/dL Final   Glucose reference range applies only to samples taken after fasting for at least 8 hours.   BUN 12/06/2022 13  6 - 20 mg/dL Final   Creatinine, Ser 12/06/2022 1.18  0.61 - 1.24 mg/dL Final   Calcium 12/06/2022 8.9  8.9 - 10.3 mg/dL Final   Total Protein 12/06/2022 8.2 (H)  6.5 - 8.1 g/dL Final   Albumin 12/06/2022 3.5  3.5 - 5.0 g/dL Final   AST 12/06/2022 45 (H)  15 - 41 U/L Final   ALT 12/06/2022 42  0 - 44 U/L Final   Alkaline Phosphatase 12/06/2022 59  38 - 126 U/L Final   Total Bilirubin 12/06/2022 0.7  0.3 - 1.2 mg/dL Final   GFR, Estimated 12/06/2022 >60  >60 mL/min Final   Comment: (NOTE) Calculated using the CKD-EPI Creatinine Equation (2021)    Anion gap 12/06/2022 13  5 - 15 Final   Performed at [place] [address], [place], [place] [address]   Hgb A1c MFr Bld 12/06/2022 4.9  4.8 - 5.6 % Final   Comment: (NOTE) Pre diabetes:          5.7%-6.4%  Diabetes:              >6.4%  Glycemic control for   <7.0% adults with diabetes    Mean Plasma Glucose 12/06/2022 93.93  mg/dL Final   Performed at [facility], [place] [address], [place], [place] [address]   Alcohol, Ethyl (B) 12/06/2022 258 (H)  <10 mg/dL Final   Comment: (NOTE) Lowest detectable limit for serum alcohol is 10 mg/dL.  For medical purposes only. Performed at [facility], [place] [address], [place], [place] [address]    Cholesterol 12/06/2022 147  0 - 200 mg/dL Final   Triglycerides 12/06/2022 216 (H)  <150 mg/dL Final   HDL 12/06/2022 54  >40 mg/dL Final   Total CHOL/HDL Ratio 12/06/2022 2.7  RATIO Final   VLDL 12/06/2022 43 (H)  0 - 40 mg/dL Final   LDL Cholesterol 12/06/2022 50  0 - 99 mg/dL Final   Comment:        Total Cholesterol/HDL:CHD Risk Coronary Heart Disease Risk Table                     Men   Women  1/2 Average Risk   3.4   3.3  Average Risk       5.0   4.4  2 X Average Risk   9.6   7.1  3 X Average Risk  23.4    11.0        Use the calculated Patient Ratio above and the CHD Risk Table to determine the patient's CHD Risk.        ATP III CLASSIFICATION (LDL):  <100     mg/dL   Optimal  100-129  mg/dL   Near or Above                    Optimal  130-159  mg/dL   Borderline  160-189  mg/dL   High  >190     mg/dL   Very High Performed at [place] [address], [place], [place] [address]    TSH 12/06/2022 0.841  0.350 - 4.500 uIU/mL Final   Comment: Performed by a 3rd Generation assay with a functional sensitivity of <=0.01 uIU/mL. Performed at [facility], [place] [address] [place], [place] [address]    SARSCOV2ONAVIRUS 2 AG 12/06/2022 NEGATIVE  NEGATIVE Final   Comment: (NOTE) SARS-CoV-2 antigen NOT DETECTED.   Negative results are presumptive.  Negative results do not preclude SARS-CoV-2 infection and should not be used as the sole basis for treatment or other patient management decisions, including infection  control decisions, particularly in the presence of clinical signs and  symptoms consistent with COVID-19, or in those who have been in contact with the virus.  Negative results must be combined with clinical observations, patient history, and epidemiological information. The expected result is Negative.  Fact Sheet for Patients: HandmadeRecipes.com.cy  Fact Sheet for Healthcare Providers: FuneralLife.at  This test is not yet approved or cleared by the [place] FDA and  has been authorized for detection and/or diagnosis of SARS-CoV-2 by FDA under an Emergency Use Authorization (EUA).  This EUA will remain in effect (meaning this test can be used) for the duration of  the COV  ID-19 declaration under Section 564(b)(1) of the Act, 21 U.S.C. section 360bbb-3(b)(1), unless the authorization is terminated or revoked sooner.    Admission on 12/03/2022, Discharged on 12/03/2022  Component  Date Value Ref Range Status   SARS Coronavirus 2 by RT PCR 12/03/2022 NEGATIVE  NEGATIVE Final   Comment: (NOTE) SARS-CoV-2 target nucleic acids are NOT DETECTED.  The SARS-CoV-2 RNA is generally detectable in upper respiratory specimens during the acute phase of infection. The lowest concentration of SARS-CoV-2 viral copies this assay can detect is 138 copies/mL. A negative result does not preclude SARS-Cov-2 infection and should not be used as the sole basis for treatment or other patient management decisions. A negative result may occur with  improper specimen collection/handling, submission of specimen other than nasopharyngeal swab, presence of viral mutation(s) within the areas targeted by this assay, and inadequate number of viral copies(<138 copies/mL). A negative result must be combined with clinical observations, patient history, and epidemiological information. The expected result is Negative.  Fact Sheet for Patients:  EntrepreneurPulse.com.au  Fact Sheet for Healthcare Providers:  IncredibleEmployment.be  This test is no                          t yet approved or cleared by the [place] FDA and  has been authorized for detection and/or diagnosis of SARS-CoV-2 by FDA under an Emergency Use Authorization (EUA). This EUA will remain  in effect (meaning this test can be used) for the duration of the COVID-19 declaration under Section 564(b)(1) of the Act, 21 U.S.C.section 360bbb-3(b)(1), unless the authorization is terminated  or revoked sooner.       Influenza A by PCR 12/03/2022 NEGATIVE  NEGATIVE Final   Influenza B by PCR 12/03/2022 NEGATIVE  NEGATIVE Final   Comment: (NOTE) The Xpert Xpress SARS-CoV-2/FLU/RSV plus assay is intended as an aid in the diagnosis of influenza from Nasopharyngeal swab specimens and should not be used as a sole basis for treatment. Nasal washings and aspirates are unacceptable for Xpert Xpress  SARS-CoV-2/FLU/RSV testing.  Fact Sheet for Patients: EntrepreneurPulse.com.au  Fact Sheet for Healthcare Providers: IncredibleEmployment.be  This test is not yet approved or cleared by the [place] FDA and has been authorized for detection and/or diagnosis of SARS-CoV-2 by FDA under an Emergency Use Authorization (EUA). This EUA will remain in effect (meaning this test can be used) for the duration of the COVID-19 declaration under Section 564(b)(1) of the Act, 21 U.S.C. section 360bbb-3(b)(1), unless the authorization is terminated or revoked.     Resp Syncytial Virus by PCR 12/03/2022 NEGATIVE  NEGATIVE Final   Comment: (NOTE) Fact Sheet for Patients: EntrepreneurPulse.com.au  Fact Sheet for Healthcare Providers: IncredibleEmployment.be  This test is not yet approved or cleared by the [place] FDA and has been authorized for detection and/or diagnosis of SARS-CoV-2 by FDA under an Emergency Use Authorization (EUA). This EUA will remain in effect (meaning this test can be used) for the duration of the COVID-19 declaration under Section 564(b)(1) of the Act, 21 U.S.C. section 360bbb-3(b)(1), unless the authorization is terminated or revoked.  Performed at [facility], [place] [address] [place], [place] [address]    Sodium 12/03/2022 137  135 - 145 mmol/L Final   Potassium 12/03/2022 3.3 (L)  3.5 - 5.1 mmol/L Final   Chloride 12/03/2022 105  98 - 111 mmol/L Final   CO2 12/03/2022 20 (L)  22 - 32 mmol/L Final   Glucose, Bld 12/03/2022  90  70 - 99 mg/dL Final   Glucose reference range applies only to samples taken after fasting for at least 8 hours.   BUN 12/03/2022 15  6 - 20 mg/dL Final   Creatinine, Ser 12/03/2022 0.98  0.61 - 1.24 mg/dL Final   Calcium 12/03/2022 8.3 (L)  8.9 - 10.3 mg/dL Final   GFR, Estimated 12/03/2022 >60  >60 mL/min Final   Comment:  (NOTE) Calculated using the CKD-EPI Creatinine Equation (2021)    Anion gap 12/03/2022 12  5 - 15 Final   Performed at [facility], [place] [address], [place], [place] [address]   WBC 12/03/2022 3.0 (L)  4.0 - 10.5 K/uL Final   RBC 12/03/2022 4.21 (L)  4.22 - 5.81 MIL/uL Final   Hemoglobin 12/03/2022 12.9 (L)  13.0 - 17.0 g/dL Final   HCT 12/03/2022 39.0  39.0 - 52.0 % Final   MCV 12/03/2022 92.6  80.0 - 100.0 fL Final   MCH 12/03/2022 30.6  26.0 - 34.0 pg Final   MCHC 12/03/2022 33.1  30.0 - 36.0 g/dL Final   RDW 12/03/2022 14.3  11.5 - 15.5 % Final   Platelets 12/03/2022 283  150 - 400 K/uL Final   nRBC 12/03/2022 0.0  0.0 - 0.2 % Final   Neutrophils Relative % 12/03/2022 52  % Final   Neutro Abs 12/03/2022 1.5 (L)  1.7 - 7.7 K/uL Final   Lymphocytes Relative 12/03/2022 35  % Final   Lymphs Abs 12/03/2022 1.0  0.7 - 4.0 K/uL Final   Monocytes Relative 12/03/2022 11  % Final   Monocytes Absolute 12/03/2022 0.3  0.1 - 1.0 K/uL Final   Eosinophils Relative 12/03/2022 1  % Final   Eosinophils Absolute 12/03/2022 0.0  0.0 - 0.5 K/uL Final   Basophils Relative 12/03/2022 1  % Final   Basophils Absolute 12/03/2022 0.0  0.0 - 0.1 K/uL Final   Immature Granulocytes 12/03/2022 0  % Final   Abs Immature Granulocytes 12/03/2022 0.01  0.00 - 0.07 K/uL Final   Performed at [facility], [place] [address] [place], [place] [address]   Troponin I (High Sensitivity) 12/03/2022 6  <18 ng/L Final   Comment: (NOTE) Elevated high sensitivity troponin I (hsTnI) values and significant  changes across serial measurements may suggest ACS but many other  chronic and acute conditions are known to elevate hsTnI results.  Refer to the "Links" section for chest pain algorithms and additional  guidance. Performed at [facility], [place] [address], [place], [place] [address]    D-Dimer, Quant 12/03/2022 0.30  0.00 - 0.50 ug/mL-FEU Final   Comment: (NOTE) At  the manufacturer cut-off value of 0.5 g/mL FEU, this assay has a negative predictive value of 95-100%.This assay is intended for use in conjunction with a clinical pretest probability (PTP) assessment model to exclude pulmonary embolism (PE) and deep venous thrombosis (DVT) in outpatients suspected of PE or DVT. Results should be correlated with clinical presentation. Performed at [facility], [place] [address] [place], [place] [address]    Troponin I (High Sensitivity) 12/03/2022 6  <18 ng/L Final   Comment: (NOTE) Elevated high sensitivity troponin I (hsTnI) values and significant  changes across serial measurements may suggest ACS but many other  chronic and acute conditions are known to elevate hsTnI results.  Refer to the "Links" section for chest pain algorithms and additional  guidance. Performed at [facility], [place] [address] [place],  [place] [address]   Admission on 11/18/2022, Discharged on 11/19/2022  Component Date Value Ref Range Status   WBC 11/18/2022 5.2  4.0 - 10.5 K/uL Final   RBC 11/18/2022 4.33  4.22 - 5.81 MIL/uL Final   Hemoglobin 11/18/2022 13.4  13.0 - 17.0 g/dL Final   HCT 11/18/2022 39.9  39.0 - 52.0 % Final   MCV 11/18/2022 92.1  80.0 - 100.0 fL Final   MCH 11/18/2022 30.9  26.0 - 34.0 pg Final   MCHC 11/18/2022 33.6  30.0 - 36.0 g/dL Final   RDW 11/18/2022 14.2  11.5 - 15.5 % Final   Platelets 11/18/2022 299  150 - 400 K/uL Final   nRBC 11/18/2022 0.0  0.0 - 0.2 % Final   Performed at [facility], [place] [address] [place], [place] [address]   Troponin I (High Sensitivity) 11/18/2022 6  <18 ng/L Final   Comment: (NOTE) Elevated high sensitivity troponin I (hsTnI) values and significant  changes across serial measurements may suggest ACS but many other  chronic and acute conditions are known to elevate hsTnI results.  Refer to the "Links" section for chest pain algorithms and additional  guidance. Performed  at [facility], [place] [address] [place], [place] [address]    Alcohol, Ethyl (B) 11/18/2022 178 (H)  <10 mg/dL Final   Comment: (NOTE) Lowest detectable limit for serum alcohol is 10 mg/dL.  For medical purposes only. Performed at [facility], [place] [address] [place],  [address]    Opiates 11/19/2022 NONE DETECTED  NONE DETECTED Final   Cocaine 11/19/2022 NONE DETECTED  NONE DETECTED Final   Benzodiazepines 11/19/2022 NONE DETECTED  NONE DETECTED Final   Amphetamines 11/19/2022 NONE DETECTED  NONE DETECTED Final   Tetrahydrocannabinol 11/19/2022 NONE DETECTED  NONE DETECTED Final   Barbiturates 11/19/2022 NONE DETECTED  NONE DETECTED Final   Comment: (NOTE) DRUG SCREEN FOR MEDICAL PURPOSES ONLY.  IF CONFIRMATION IS NEEDED FOR ANY PURPOSE, NOTIFY LAB WITHIN 5 DAYS.  LOWEST DETECTABLE LIMITS FOR URINE DRUG SCREEN Drug Class                     Cutoff (ng/mL) Amphetamine and metabolites    1000 Barbiturate and metabolites    200 Benzodiazepine                 200 Opiates and metabolites        300 Cocaine and metabolites        300 THC                            50 Performed at [facility], [place] [address] [place], [place] [address]    Sodium 11/18/2022 136  135 - 145 mmol/L Final   Potassium 11/18/2022 3.2 (L)  3.5 - 5.1 mmol/L Final   Chloride 11/18/2022 105  98 - 111 mmol/L Final   CO2 11/18/2022 20 (L)  22 - 32 mmol/L Final   Glucose, Bld 11/18/2022 96  70 - 99 mg/dL Final   Glucose reference range applies only to samples taken after fasting for at least 8 hours.   BUN 11/18/2022 13  6 - 20 mg/dL Final   Creatinine, Ser 11/18/2022 1.01  0.61 - 1.24 mg/dL Final   Calcium 11/18/2022 8.8 (L)  8.9 - 10.3 mg/dL Final   Total Protein 11/18/2022 8.5 (H)  6.5 - 8.1 g/dL Final   Albumin  11/18/2022 3.5  3.5 - 5.0 g/dL Final   AST 11/18/2022 43 (H)  15 - 41 U/L Final   ALT 11/18/2022 44  0 - 44 U/L Final   Alkaline Phosphatase 11/18/2022 57  38 - 126  U/L Final   Total Bilirubin 11/18/2022 0.9  0.3 - 1.2 mg/dL Final   GFR, Estimated 11/18/2022 >60  >60 mL/min Final   Comment: (NOTE) Calculated using the CKD-EPI Creatinine Equation (2021)    Anion gap 11/18/2022 11  5 - 15 Final   Performed at [place] [address], [place], [place] [address]   Troponin I (High Sensitivity) 11/19/2022 17  <18 ng/L Final   Comment: (NOTE) Elevated high sensitivity troponin I (hsTnI) values and significant  changes across serial measurements may suggest ACS but many other  chronic and acute conditions are known to elevate hsTnI results.  Refer to the "Links" section for chest pain algorithms and additional  guidance. Performed at [facility], [place] [address], [place], [place] [address]   Admission on 11/14/2022, Discharged on 11/14/2022  Component Date Value Ref Range Status   Glucose-Capillary 11/14/2022 104 (H)  70 - 99 mg/dL Final   Glucose reference range applies only to samples taken after fasting for at least 8 hours.   Comment 1 11/14/2022 Notify RN   Final   Comment 2 11/14/2022 Document in Chart   Final   Sodium 11/14/2022 137  135 - 145 mmol/L Final   Potassium 11/14/2022 3.1 (L)  3.5 - 5.1 mmol/L Final   Chloride 11/14/2022 105  98 - 111 mmol/L Final   CO2 11/14/2022 20 (L)  22 - 32 mmol/L Final   Glucose, Bld 11/14/2022 100 (H)  70 - 99 mg/dL Final   Glucose reference range applies only to samples taken after fasting for at least 8 hours.   BUN 11/14/2022 8  6 - 20 mg/dL Final   Creatinine, Ser 11/14/2022 0.96  0.61 - 1.24 mg/dL Final   Calcium 11/14/2022 8.6 (L)  8.9 - 10.3 mg/dL Final   Total Protein 11/14/2022 8.7 (H)  6.5 - 8.1 g/dL Final   Albumin 11/14/2022 3.4 (L)  3.5 - 5.0 g/dL Final   AST 11/14/2022 34  15 - 41 U/L Final   ALT 11/14/2022 30  0 - 44 U/L Final   Alkaline Phosphatase 11/14/2022 49  38 - 126 U/L Final   Total Bilirubin 11/14/2022 0.9  0.3 - 1.2 mg/dL Final   GFR, Estimated 11/14/2022 >60   >60 mL/min Final   Comment: (NOTE) Calculated using the CKD-EPI Creatinine Equation (2021)    Anion gap 11/14/2022 12  5 - 15 Final   Performed at [place] [address] [place], [place] [id number]   Salicylate Lvl 99991111 <7.0 (L)  7.0 - 30.0 mg/dL Final   Performed at [place] [address] [place], [place] [address]   Acetaminophen (Tylenol), Serum 11/14/2022 <10 (L)  10 - 30 ug/mL Final   Comment: (NOTE) Therapeutic concentrations vary significantly. A range of 10-30 ug/mL  may be an effective concentration for many patients. However, some  are best treated at concentrations outside of this range. Acetaminophen concentrations >150 ug/mL at 4 hours after ingestion  and >50 ug/mL at 12 hours after ingestion are often associated with  toxic reactions.  Performed at [facility], [place] [address] [place], [place] [address]    Alcohol, Ethyl (B) 11/14/2022 133 (H)  <10  mg/dL Final   Comment: (NOTE) Lowest detectable limit for serum alcohol is 10 mg/dL.  For medical purposes only. Performed at [facility], [place] [address] [place], [place] [address]    WBC 11/14/2022 3.6 (L)  4.0 - 10.5 K/uL Final   RBC 11/14/2022 4.19 (L)  4.22 - 5.81 MIL/uL Final   Hemoglobin 11/14/2022 13.1  13.0 - 17.0 g/dL Final   HCT 11/14/2022 39.2  39.0 - 52.0 % Final   MCV 11/14/2022 93.6  80.0 - 100.0 fL Final   MCH 11/14/2022 31.3  26.0 - 34.0 pg Final   MCHC 11/14/2022 33.4  30.0 - 36.0 g/dL Final   RDW 11/14/2022 14.3  11.5 - 15.5 % Final   Platelets 11/14/2022 245  150 - 400 K/uL Final   nRBC 11/14/2022 0.0  0.0 - 0.2 % Final   Neutrophils Relative % 11/14/2022 49  % Final   Neutro Abs 11/14/2022 1.7  1.7 - 7.7 K/uL Final   Lymphocytes Relative 11/14/2022 38  % Final   Lymphs Abs 11/14/2022 1.3  0.7 - 4.0 K/uL Final   Monocytes Relative 11/14/2022 10  % Final   Monocytes Absolute 11/14/2022 0.4  0.1 - 1.0 K/uL Final   Eosinophils Relative 11/14/2022 2  %  Final   Eosinophils Absolute 11/14/2022 0.1  0.0 - 0.5 K/uL Final   Basophils Relative 11/14/2022 1  % Final   Basophils Absolute 11/14/2022 0.0  0.0 - 0.1 K/uL Final   Immature Granulocytes 11/14/2022 0  % Final   Abs Immature Granulocytes 11/14/2022 0.01  0.00 - 0.07 K/uL Final   Performed at [facility], [place] [address] [place], [place] [address]  Admission on 09/12/2022, Discharged on 09/13/2022  Component Date Value Ref Range Status   WBC 09/12/2022 3.8 (L)  4.0 - 10.5 K/uL Final   RBC 09/12/2022 4.25  4.22 - 5.81 MIL/uL Final   Hemoglobin 09/12/2022 13.4  13.0 - 17.0 g/dL Final   HCT 09/12/2022 40.1  39.0 - 52.0 % Final   MCV 09/12/2022 94.4  80.0 - 100.0 fL Final   MCH 09/12/2022 31.5  26.0 - 34.0 pg Final   MCHC 09/12/2022 33.4  30.0 - 36.0 g/dL Final   RDW 09/12/2022 14.2  11.5 - 15.5 % Final   Platelets 09/12/2022 295  150 - 400 K/uL Final   nRBC 09/12/2022 0.0  0.0 - 0.2 % Final   Neutrophils Relative % 09/12/2022 50  % Final   Neutro Abs 09/12/2022 1.9  1.7 - 7.7 K/uL Final   Lymphocytes Relative 09/12/2022 37  % Final   Lymphs Abs 09/12/2022 1.4  0.7 - 4.0 K/uL Final   Monocytes Relative 09/12/2022 10  % Final   Monocytes Absolute 09/12/2022 0.4  0.1 - 1.0 K/uL Final   Eosinophils Relative 09/12/2022 2  % Final   Eosinophils Absolute 09/12/2022 0.1  0.0 - 0.5 K/uL Final   Basophils Relative 09/12/2022 1  % Final   Basophils Absolute 09/12/2022 0.0  0.0 - 0.1 K/uL Final   Immature Granulocytes 09/12/2022 0  % Final   Abs Immature Granulocytes 09/12/2022 0.01  0.00 - 0.07 K/uL Final   Reactive, Benign Lymphocytes 09/12/2022 PRESENT   Final   Ovalocytes 09/12/2022 PRESENT   Final   Stomatocytes 09/12/2022 PRESENT   Final   Platelet Morphology 09/12/2022 NORMAL   Final   Performed at [facility], [place] [address] [place],  [address]   Sodium 09/12/2022 135  135 -  145 mmol/L Final   Potassium 09/12/2022 3.2 (L)  3.5 - 5.1 mmol/L Final    Chloride 09/12/2022 103  98 - 111 mmol/L Final   CO2 09/12/2022 21 (L)  22 - 32 mmol/L Final   Glucose, Bld 09/12/2022 92  70 - 99 mg/dL Final   Glucose reference range applies only to samples taken after fasting for at least 8 hours.   BUN 09/12/2022 7  6 - 20 mg/dL Final   Creatinine, Ser 09/12/2022 0.90  0.61 - 1.24 mg/dL Final   Calcium 09/12/2022 8.6 (L)  8.9 - 10.3 mg/dL Final   GFR, Estimated 09/12/2022 >60  >60 mL/min Final   Comment: (NOTE) Calculated using the CKD-EPI Creatinine Equation (2021)    Anion gap 09/12/2022 11  5 - 15 Final   Performed at [facility], [place] [address], [place], [place] [address]   Lactic Acid, Venous 09/12/2022 2.2 (HH)  0.5 - 1.9 mmol/L Final   Comment: CRITICAL RESULT CALLED TO, READ BACK BY AND VERIFIED WITH: COLES,L AT 0026 ON 09/13/22 BY LUZOLOP Performed at [facility], [place] [address] [place], [place] [address]    Specimen Description 09/12/2022    Final                   Value:BLOOD BLOOD RIGHT HAND Performed at [facility], [place] [address], [place], [place] [address]    Special Requests 09/12/2022    Final                   Value:BOTTLES DRAWN AEROBIC AND ANAEROBIC Blood Culture results may not be optimal due to an inadequate volume of blood received in culture bottles Performed at [facility], [place] [address], [place], [place] [address]    Culture 09/12/2022    Final                   Value:NO GROWTH 5 DAYS Performed at [place] [address], [place], [place] [address]    Report Status 09/12/2022 09/18/2022 FINAL   Final   Prothrombin Time 09/13/2022 13.5  11.4 - 15.2 seconds Final   INR 09/13/2022 1.0  0.8 - 1.2 Final   Comment: (NOTE) INR goal varies based on device and disease states. Performed at [facility], [place] [address], [place], [place] [address]    Lactic Acid, Venous 09/13/2022 3.1 (HH)  0.5 - 1.9 mmol/L Final    Comment: CRITICAL RESULT CALLED TO, READ BACK BY AND VERIFIED WITH [place],J AT 0529 ON 09/13/22 BY LUZOLOP Performed at [facility], [place] [address], [place], [place] [address]    Lactic Acid, Venous 09/13/2022 1.6  0.5 - 1.9 mmol/L Final   Performed at [place] [address], [place], [place] [address]  Admission on 08/21/2022, Discharged on 08/21/2022  Component Date Value Ref Range Status   WBC 08/21/2022 8.0  4.0 - 10.5 K/uL Final   RBC 08/21/2022 4.13 (L)  4.22 - 5.81 MIL/uL Final   Hemoglobin 08/21/2022 12.9 (L)  13.0 - 17.0 g/dL Final   HCT 08/21/2022 39.1  39.0 - 52.0 % Final   MCV 08/21/2022 94.7  80.0 - 100.0 fL Final   MCH 08/21/2022 31.2  26.0 - 34.0 pg Final   MCHC 08/21/2022 33.0  30.0 - 36.0 g/dL Final   RDW 08/21/2022 13.7  11.5 - 15.5 % Final   Platelets 08/21/2022 256  150 -  400 K/uL Final   nRBC 08/21/2022 0.0  0.0 - 0.2 % Final   Neutrophils Relative % 08/21/2022 80  % Final   Neutro Abs 08/21/2022 6.3  1.7 - 7.7 K/uL Final   Lymphocytes Relative 08/21/2022 10  % Final   Lymphs Abs 08/21/2022 0.8  0.7 - 4.0 K/uL Final   Monocytes Relative 08/21/2022 10  % Final   Monocytes Absolute 08/21/2022 0.8  0.1 - 1.0 K/uL Final   Eosinophils Relative 08/21/2022 0  % Final   Eosinophils Absolute 08/21/2022 0.0  0.0 - 0.5 K/uL Final   Basophils Relative 08/21/2022 0  % Final   Basophils Absolute 08/21/2022 0.0  0.0 - 0.1 K/uL Final   Immature Granulocytes 08/21/2022 0  % Final   Abs Immature Granulocytes 08/21/2022 0.02  0.00 - 0.07 K/uL Final   Performed at [facility], [place] [address], [place], [place] [address]   Sodium 08/21/2022 137  135 - 145 mmol/L Final   Potassium 08/21/2022 3.8  3.5 - 5.1 mmol/L Final   Chloride 08/21/2022 101  98 - 111 mmol/L Final   BUN 08/21/2022 9  6 - 20 mg/dL Final   Creatinine, Ser 08/21/2022 1.10  0.61 - 1.24 mg/dL Final   Glucose, Bld 08/21/2022 89  70 - 99 mg/dL Final   Glucose  reference range applies only to samples taken after fasting for at least 8 hours.   Calcium, Ion 08/21/2022 1.16  1.15 - 1.40 mmol/L Final   TCO2 08/21/2022 25  22 - 32 mmol/L Final   Hemoglobin 08/21/2022 13.6  13.0 - 17.0 g/dL Final   HCT 08/21/2022 40.0  39.0 - 52.0 % Final   SARS Coronavirus 2 by RT PCR 08/21/2022 NEGATIVE  NEGATIVE Final   Comment: (NOTE) SARS-CoV-2 target nucleic acids are NOT DETECTED.  The SARS-CoV-2 RNA is generally detectable in upper and lower respiratory specimens during the acute phase of infection. The lowest concentration of SARS-CoV-2 viral copies this assay can detect is 250 copies / mL. A negative result does not preclude SARS-CoV-2 infection and should not be used as the sole basis for treatment or other patient management decisions.  A negative result may occur with improper specimen collection / handling, submission of specimen other than nasopharyngeal swab, presence of viral mutation(s) within the areas targeted by this assay, and inadequate number of viral copies (<250 copies / mL). A negative result must be combined with clinical observations, patient history, and epidemiological information.  Fact Sheet for Patients:   https://www.patel.info/  Fact Sheet for Healthcare Providers: https://hall.com/  This test is not yet approved or                           cleared by the [place] FDA and has been authorized for detection and/or diagnosis of SARS-CoV-2 by FDA under an Emergency Use Authorization (EUA).  This EUA will remain in effect (meaning this test can be used) for the duration of the COVID-19 declaration under Section 564(b)(1) of the Act, 21 U.S.C. section 360bbb-3(b)(1), unless the authorization is terminated or revoked sooner.  Performed at [facility], [place] [address], [place], [place] [address]    Troponin I (High Sensitivity) 08/21/2022 9  <18 ng/L Final   Comment:  (NOTE) Elevated high sensitivity troponin I (hsTnI) values and significant  changes across serial measurements may suggest ACS but many other  chronic and acute conditions are known to elevate hsTnI results.  Refer to the "Links" section for chest pain algorithms and additional  guidance. Performed at [facility], [place] [address], [place], [place] [address]    Troponin I (High Sensitivity) 08/21/2022 9  <18 ng/L Final   Comment: (NOTE) Elevated high sensitivity troponin I (hsTnI) values and significant  changes across serial measurements may suggest ACS but many other  chronic and acute conditions are known to elevate hsTnI results.  Refer to the "Links" section for chest pain algorithms and additional  guidance. Performed at [facility], [place] [address] [place], [place] [address]    Opiates 08/21/2022 NONE DETECTED  NONE DETECTED Final   Cocaine 08/21/2022 POSITIVE (A)  NONE DETECTED Final   Benzodiazepines 08/21/2022 NONE DETECTED  NONE DETECTED Final   Amphetamines 08/21/2022 NONE DETECTED  NONE DETECTED Final   Tetrahydrocannabinol 08/21/2022 NONE DETECTED  NONE DETECTED Final   Barbiturates 08/21/2022 NONE DETECTED  NONE DETECTED Final   Comment: (NOTE) DRUG SCREEN FOR MEDICAL PURPOSES ONLY.  IF CONFIRMATION IS NEEDED FOR ANY PURPOSE, NOTIFY LAB WITHIN 5 DAYS.  LOWEST DETECTABLE LIMITS FOR URINE DRUG SCREEN Drug Class                     Cutoff (ng/mL) Amphetamine and metabolites    1000 Barbiturate and metabolites    200 Benzodiazepine                 200 Opiates and metabolites        300 Cocaine and metabolites        300 THC                            50 Performed at [facility], [place] [address] [place], [place] [address]    Alcohol, Ethyl (B) 08/21/2022 <10  <10 mg/dL Final   Comment: (NOTE) Lowest detectable limit for serum alcohol is 10 mg/dL.  For medical purposes only. Performed at [facility], [place] [address] [place],  [place] [address]    SARS Coronavirus 2 by RT PCR 08/21/2022 NEGATIVE  NEGATIVE Final   Comment: (NOTE) SARS-CoV-2 target nucleic acids are NOT DETECTED.  The SARS-CoV-2 RNA is generally detectable in upper respiratory specimens during the acute phase of infection. The lowest concentration of SARS-CoV-2 viral copies this assay can detect is 138 copies/mL. A negative result does not preclude SARS-Cov-2 infection and should not be used as the sole basis for treatment or other patient management decisions. A negative result may occur with  improper specimen collection/handling, submission of specimen other than nasopharyngeal swab, presence of viral mutation(s) within the areas targeted by this assay, and inadequate number of viral copies(<138 copies/mL). A negative result must be combined with clinical observations, patient history, and epidemiological information. The expected result is Negative.  Fact Sheet for Patients:  EntrepreneurPulse.com.au  Fact Sheet for Healthcare Providers:  IncredibleEmployment.be  This test is no                          t yet approved or cleared by the [place] FDA and  has been authorized for detection and/or diagnosis of SARS-CoV-2 by FDA under an Emergency Use Authorization (EUA). This EUA will remain  in effect (meaning this test can be used) for the duration of the COVID-19 declaration under Section 564(b)(1) of the Act, 21 U.S.C.section 360bbb-3(b)(1), unless the authorization is terminated  or revoked sooner.       Influenza A by PCR 08/21/2022 NEGATIVE  NEGATIVE Final   Influenza B by PCR 08/21/2022 NEGATIVE  NEGATIVE Final   Comment: (NOTE) The Xpert Xpress SARS-CoV-2/FLU/RSV plus assay is intended as an aid in the diagnosis of influenza from Nasopharyngeal swab specimens and should not be used as a sole basis for treatment. Nasal washings and aspirates are unacceptable for Xpert Xpress  SARS-CoV-2/FLU/RSV testing.  Fact Sheet for Patients: EntrepreneurPulse.com.au  Fact Sheet for Healthcare Providers: IncredibleEmployment.be  This test is not yet approved or cleared by the [place] FDA and has been authorized for detection and/or diagnosis of SARS-CoV-2 by FDA under an Emergency Use Authorization (EUA). This EUA will remain in effect (meaning this test can be used) for the duration of the COVID-19 declaration under Section 564(b)(1) of the Act, 21 U.S.C. section 360bbb-3(b)(1), unless the authorization is terminated or revoked.  Performed at [facility], [place] [address], [place],  [address]   Admission on 08/05/2022, Discharged on 08/05/2022  Component Date Value Ref Range Status   WBC 08/05/2022 4.0  4.0 - 10.5 K/uL Final   RBC 08/05/2022 4.38  4.22 - 5.81 MIL/uL Final   Hemoglobin 08/05/2022 13.7  13.0 - 17.0 g/dL Final   HCT 08/05/2022 41.1  39.0 - 52.0 % Final   MCV 08/05/2022 93.8  80.0 - 100.0 fL Final   MCH 08/05/2022 31.3  26.0 - 34.0 pg Final   MCHC 08/05/2022 33.3  30.0 - 36.0 g/dL Final   RDW 08/05/2022 13.4  11.5 - 15.5 % Final   Platelets 08/05/2022 235  150 - 400 K/uL Final   nRBC 08/05/2022 0.0  0.0 - 0.2 % Final   Neutrophils Relative % 08/05/2022 63  % Final   Neutro Abs 08/05/2022 2.5  1.7 - 7.7 K/uL Final   Lymphocytes Relative 08/05/2022 25  % Final   Lymphs Abs 08/05/2022 1.0  0.7 - 4.0 K/uL Final   Monocytes Relative 08/05/2022 10  % Final   Monocytes Absolute 08/05/2022 0.4  0.1 - 1.0 K/uL Final   Eosinophils Relative 08/05/2022 1  % Final   Eosinophils Absolute 08/05/2022 0.0  0.0 - 0.5 K/uL Final   Basophils Relative 08/05/2022 1  % Final   Basophils Absolute 08/05/2022 0.0  0.0 - 0.1 K/uL Final   Immature Granulocytes 08/05/2022 0  % Final   Abs Immature Granulocytes 08/05/2022 0.01  0.00 - 0.07 K/uL Final   Performed at [facility], [place] [address] [place], [place]  [address]   Sodium 08/05/2022 135  135 - 145 mmol/L Final   Potassium 08/05/2022 3.5  3.5 - 5.1 mmol/L Final   Chloride 08/05/2022 106  98 - 111 mmol/L Final   CO2 08/05/2022 21 (L)  22 - 32 mmol/L Final   Glucose, Bld 08/05/2022 97  70 - 99 mg/dL Final   Glucose reference range applies only to samples taken after fasting for at least 8 hours.   BUN 08/05/2022 12  6 - 20 mg/dL Final   Creatinine, Ser 08/05/2022 0.92  0.61 - 1.24 mg/dL Final   Calcium 08/05/2022 8.6 (L)  8.9 - 10.3 mg/dL Final   Total Protein 08/05/2022 8.3 (H)  6.5 - 8.1 g/dL Final   Albumin 08/05/2022 3.7  3.5 - 5.0 g/dL Final   AST 08/05/2022 35  15 - 41 U/L Final   ALT 08/05/2022 39  0 - 44 U/L Final   Alkaline Phosphatase 08/05/2022  56  38 - 126 U/L Final   Total Bilirubin 08/05/2022 0.3  0.3 - 1.2 mg/dL Final   GFR, Estimated 08/05/2022 >60  >60 mL/min Final   Comment: (NOTE) Calculated using the CKD-EPI Creatinine Equation (2021)    Anion gap 08/05/2022 8  5 - 15 Final   Performed at [place] [address] [place], [place] [address]   Lipase 08/05/2022 67 (H)  11 - 51 U/L Final   Performed at [place] [address], [place], [place] [address]   Troponin I (High Sensitivity) 08/05/2022 5  <18 ng/L Final   Comment: (NOTE) Elevated high sensitivity troponin I (hsTnI) values and significant  changes across serial measurements may suggest ACS but many other  chronic and acute conditions are known to elevate hsTnI results.  Refer to the "Links" section for chest pain algorithms and additional  guidance. Performed at [facility], [place] [address], [place], [place] [address]    Troponin I (High Sensitivity) 08/05/2022 6  <18 ng/L Final   Comment: (NOTE) Elevated high sensitivity troponin I (hsTnI) values and significant  changes across serial measurements may suggest ACS but many other  chronic and acute conditions are known to elevate hsTnI results.  Refer to the "Links" section for  chest pain algorithms and additional  guidance. Performed at [facility], [place] [address] [place], [place] [address]   Admission on 06/26/2022, Discharged on 06/27/2022  Component Date Value Ref Range Status   Sodium 06/26/2022 141  135 - 145 mmol/L Final   Potassium 06/26/2022 3.6  3.5 - 5.1 mmol/L Final   Chloride 06/26/2022 106  98 - 111 mmol/L Final   CO2 06/26/2022 22  22 - 32 mmol/L Final   Glucose, Bld 06/26/2022 83  70 - 99 mg/dL Final   Glucose reference range applies only to samples taken after fasting for at least 8 hours.   BUN 06/26/2022 14  6 - 20 mg/dL Final   Creatinine, Ser 06/26/2022 1.12  0.61 - 1.24 mg/dL Final   Calcium 06/26/2022 9.1  8.9 - 10.3 mg/dL Final   Total Protein 06/26/2022 8.6 (H)  6.5 - 8.1 g/dL Final   Albumin 06/26/2022 3.6  3.5 - 5.0 g/dL Final   AST 06/26/2022 35  15 - 41 U/L Final   ALT 06/26/2022 38  0 - 44 U/L Final   Alkaline Phosphatase 06/26/2022 55  38 - 126 U/L Final   Total Bilirubin 06/26/2022 0.4  0.3 - 1.2 mg/dL Final   GFR, Estimated 06/26/2022 >60  >60 mL/min Final   Comment: (NOTE) Calculated using the CKD-EPI Creatinine Equation (2021)    Anion gap 06/26/2022 13  5 - 15 Final   Performed at [facility] [address], [place], [place] [address]   Alcohol, Ethyl (B) 06/26/2022 86 (H)  <10 mg/dL Final   Comment: (NOTE) Lowest detectable limit for serum alcohol is 10 mg/dL.  For medical purposes only. Performed at [facility], [place] [address] [place], [place] [id number]    Salicylate Lvl [id number] <7.0 (L)  7.0 - 30.0 mg/dL Final   Performed at [facility] [address] [place], [place] [address]   Acetaminophen (Tylenol), Serum 06/26/2022 <10 (L)  10 - 30 ug/mL Final   Comment: (NOTE) Therapeutic concentrations vary significantly. A range of 10-30 ug/mL  may be an effective concentration for many patients. However, some  are best treated at concentrations  outside of this range. Acetaminophen  concentrations >150 ug/mL at 4 hours after ingestion  and >50 ug/mL at 12 hours after ingestion are often associated with  toxic reactions.  Performed at [facility], [place] [address], [place], [place] [address]    WBC 06/26/2022 5.6  4.0 - 10.5 K/uL Final   RBC 06/26/2022 4.66  4.22 - 5.81 MIL/uL Final   Hemoglobin 06/26/2022 14.5  13.0 - 17.0 g/dL Final   HCT 06/26/2022 43.9  39.0 - 52.0 % Final   MCV 06/26/2022 94.2  80.0 - 100.0 fL Final   MCH 06/26/2022 31.1  26.0 - 34.0 pg Final   MCHC 06/26/2022 33.0  30.0 - 36.0 g/dL Final   RDW 06/26/2022 15.0  11.5 - 15.5 % Final   Platelets 06/26/2022 267  150 - 400 K/uL Final   nRBC 06/26/2022 0.0  0.0 - 0.2 % Final   Performed at [facility] [address] [place], [place] [address]   Opiates 06/26/2022 NONE DETECTED  NONE DETECTED Final   Cocaine 06/26/2022 NONE DETECTED  NONE DETECTED Final   Benzodiazepines 06/26/2022 NONE DETECTED  NONE DETECTED Final   Amphetamines 06/26/2022 NONE DETECTED  NONE DETECTED Final   Tetrahydrocannabinol 06/26/2022 POSITIVE (A)  NONE DETECTED Final   Barbiturates 06/26/2022 NONE DETECTED  NONE DETECTED Final   Comment: (NOTE) DRUG SCREEN FOR MEDICAL PURPOSES ONLY.  IF CONFIRMATION IS NEEDED FOR ANY PURPOSE, NOTIFY LAB WITHIN 5 DAYS.  LOWEST DETECTABLE LIMITS FOR URINE DRUG SCREEN Drug Class                     Cutoff (ng/mL) Amphetamine and metabolites    1000 Barbiturate and metabolites    200 Benzodiazepine                 [id number] Tricyclics and metabolites     300 Opiates and metabolites        300 Cocaine and metabolites        300 THC                            50 Performed at [facility], [place] [address], [place], [place] [address]   There may be more visits with results that are not included.    Allergies: Bee venom, Lisinopril, and Sulfamethoxazole-trimethoprim  Medications:  Facility Ordered Medications  Medication   acetaminophen (TYLENOL) tablet 650 mg   alum  & mag hydroxide-simeth (MAALOX/MYLANTA) 200-200-20 MG/5ML suspension 30 mL   magnesium hydroxide (MILK OF MAGNESIA) suspension 30 mL   traZODone (DESYREL) tablet 50 mg   thiamine (VITAMIN B1) injection 100 mg   [START ON 12/14/2022] thiamine (VITAMIN B1) tablet 100 mg   multivitamin with minerals tablet 1 tablet   LORazepam (ATIVAN) tablet 1 mg   hydrOXYzine (ATARAX) tablet 25 mg   loperamide (IMODIUM) capsule 2-4 mg   ondansetron (ZOFRAN-ODT) disintegrating tablet 4 mg    Medical Decision Making  Patient says that he is unable to contract for safety at this time.  Patient will be admitted to [facility] for continuous assessment with follow-up by psychiatry.  Lab Orders         Resp panel by RT-PCR (RSV, Flu A&B, Covid) Anterior Nasal Swab         CBC with Differential/Platelet         Comprehensive metabolic panel         Ethanol  POCT Urine Drug Screen - (I-Screen)         POC SARS Coronavirus 2 Ag        Recommendations  Based on my evaluation the patient does not appear to have an emergency medical condition.  Ophelia Shoulder, NP 12/13/22  1:59 AM

## 2022-12-13 NOTE — ED Notes (Signed)
Pt A&O x 4,  appears very sleepy, presents with suicidal ideations, plan to shoot self with gun.  Pt reports he feels trapped in a cycle of addiction and poverty.  Reports he spends 1/2 his check on alcohol.  Pt denies HI or AVH.  Contracts for safety.  Monitoring for safety.

## 2022-12-13 NOTE — ED Provider Notes (Addendum)
FBC/OBS ASAP Discharge Summary  Date and Time: 12/13/2022 5:05 PM  Name: William Powers  MRN:  [id number]   Discharge Diagnoses:  Final diagnoses:  Polysubstance abuse ([place])  Adjustment disorder with depressed mood    Subjective: William Powers is seen and evaluated face-to-face by this provider.  Patient is requesting to discharge as he states he has plans to move to [place].  States he already knows where to follow-up.  He is denying any suicidal or homicidal ideations.  Denies auditory or visual hallucinations.  Chart reviewed patient has multiple recent emergency room visits 13 in the past 6 months for similar presentations.  Patient is requesting a bus pass.  Discussed following up with infectious disease.  Patient appeared receptive to plan.  Will make [facility] information available on discharge summary. Case was staffed with MD Cristela Blue. Support, encouragement and reassurance was provided.   Stay Summary:  Per initial assessment note:" William Powers is a 42 year old male with psychiatric history of depression, polysubstance abuse (methamphetamine, cocaine, fentanyl), and suicidal ideation.  Patient was brought to [facility] voluntarily by law enforcement reporting suicidal ideation, homicidal ideation, and requesting alcohol detox.   Patient was seen face-to-face and his chart was reviewed by this nurse practitioner.   Patient reported he got into a verbal altercation with man prior to this assessment. He says he initially had homicidal thoughts towards the man but started having suicidal ideation because he did not want to harm anyone.  He denies current homicidal ideation but continues to endorse suicidal ideation with plan to shoot himself.  Patient says that he has access to gun.  He says that he is depressed due to ongoing alcohol abuse and financial difficulties. Patient reports that he drinks 2 pints of liquor daily. He says he is interested in alcohol  detox and residential alcohol rehab treatment program. He denies history of alcohol withdrawal symptoms including withdrawal seizure and delirium tremors.  Patient denies hallucination and paranoia.  Patient denies all other substance use.  UDS is pending.   On assessment, patient seen laying on the chair in the assessment room. He smelled of alcohol however is alert and oriented x 4 and able to fully participate in assessment.  Patient is in no apparent distress. He is speaking in a clear tone of voice at a moderate rate with good eye contact."   Total Time spent with patient: 15 minutes  Past Psychiatric History: MDD,alcohol abuse and depression Past Medical History: HIV, CHF  Family History: unknown Family Psychiatric History: nothing reported  Social History:  Polysunstance abuse  Tobacco Cessation:  N/A, patient does not currently use tobacco products  Current Medications:  No current facility-administered medications for this encounter.   No current outpatient medications on file.    PTA Medications:        05/03/2022   12:30 PM 08/11/2021    8:12 AM  Depression screen PHQ 2/9  Decreased Interest 1 0  Down, Depressed, Hopeless 2 0  PHQ - 2 Score 3 0  Altered sleeping 2   Tired, decreased energy 1   Change in appetite 0   Feeling bad or failure about yourself  2   Trouble concentrating 0   Moving slowly or fidgety/restless 0   Suicidal thoughts 2   PHQ-9 Score 10   Difficult doing work/chores Somewhat difficult     Flowsheet Row ED from 12/13/2022 in [facility] ED from 12/11/2022 in [facility] at [facility] ED from 12/06/2022 in [facility] High Risk Error: Q7 should not be populated when Q6 is No Low Risk       Musculoskeletal  Strength & Muscle Tone: within normal limits Gait & Station: normal Patient leans: N/A  Psychiatric Specialty Exam   Presentation  General Appearance:  Appropriate for Environment  Eye Contact: Good  Speech: Clear and Coherent  Speech Volume: Normal  Handedness: Right   Mood and Affect  Mood: Anxious; Depressed  Affect: Congruent   Thought Process  Thought Processes: Coherent  Descriptions of Associations:Intact  Orientation:Full (Time, Place and Person)  Thought Content:Logical  Diagnosis of Schizophrenia or Schizoaffective disorder in past: No    Hallucinations:Hallucinations: None  Ideas of Reference:None  Suicidal Thoughts:NO  Homicidal Thoughts:No  Sensorium  Memory: Immediate Fair; Recent Fair; Remote Fair  Judgment: Fair  Insight: Good   Executive Functions  Concentration: Good  Attention Span: Good  Recall: [place] of Knowledge: Good  Language: Good   Psychomotor Activity  Psychomotor Activity: Psychomotor Activity: Normal   Assets  Assets: Desire for Improvement; Social Support   Sleep  Sleep: Sleep: Fair Number of Hours of Sleep: 5   Nutritional Assessment (For OBS and FBC admissions only) Has the patient had a weight loss or gain of 10 pounds or more in the last 3 months?: No Has the patient had a decrease in food intake/or appetite?: No Does the patient have dental problems?: No Does the patient have eating habits or behaviors that may be indicators of an eating disorder including binging or inducing vomiting?: No Has the patient recently lost weight without trying?: 0 Has the patient been eating poorly because of a decreased appetite?: 0 Malnutrition Screening Tool Score: 0    Physical Exam  Physical Exam Vitals and nursing note reviewed.  Cardiovascular:     Rate and Rhythm: Normal rate and regular rhythm.  Neurological:     Mental Status: He is alert and oriented to person, place, and time.  Psychiatric:        Mood and Affect: Mood normal.        Behavior: Behavior normal.    Review of Systems   Psychiatric/Behavioral:  Positive for substance abuse. Negative for depression and suicidal ideas.   All other systems reviewed and are negative.  Blood pressure 107/75, pulse 67, temperature 98.1 F (36.7 C), temperature source Oral, resp. rate 18, SpO2 99 %. There is no height or weight on file to calculate BMI.  Demographic Factors:  Male and Unemployed  Loss Factors: Financial problems/change in socioeconomic status  Historical Factors: Family history of mental illness or substance abuse and Impulsivity  Risk Reduction Factors:   Positive therapeutic relationship and Positive coping skills or problem solving skills  Continued Clinical Symptoms:  Alcohol/Substance Abuse/Dependencies  Cognitive Features That Contribute To Risk:  Closed-mindedness    Suicide Risk:  Minimal: No identifiable suicidal ideation.  Patients presenting with no risk factors but with morbid ruminations; may be classified as minimal risk based on the severity of the depressive symptoms  Plan Of Care/Follow-up recommendations:  Activity:  as tolerated Diet:  heart healthy   Disposition: Take all of you medications as prescribed by your mental healthcare provider.  Report any adverse effects and reactions from your medications to your outpatient provider promptly.  Do not engage in alcohol and or illegal drug use while on prescription medicines. Keep all scheduled appointments. This is  to ensure that you are getting refills on time and to avoid any interruption in your medication.  If you are unable to keep an appointment call to reschedule.  Be sure to follow up with resources and follow ups given. In the event of worsening symptoms call the crisis hotline, 911, and or go to the nearest emergency department for appropriate evaluation and treatment of symptoms. Follow-up with your primary care provider for your medical issues, concerns and or health care needs.    Derrill Center, NP 12/13/2022, 5:05  PM

## 2022-12-13 NOTE — Discharge Instructions (Signed)

## 2022-12-13 NOTE — BH Assessment (Signed)
Comprehensive Clinical Assessment (CCA) Note  12/13/2022 Bradlyn Underwood [id number]  DISPOSITION: Completed CCA accompanied by Leandro Reasoner, NP who completed MSE and admitted Pt to [facility] for continuous assessment.  The patient demonstrates the following risk factors for suicide: Chronic risk factors for suicide include: substance use disorder. Acute risk factors for suicide include: loss (financial, interpersonal, professional). Protective factors for this patient include: coping skills. Considering these factors, the overall suicide risk at this point appears to be high. Patient is not appropriate for outpatient follow up.  Pt is a 42 year old single male who presents unaccompanied to [facility] reporting alcohol use, suicidal ideation, and homicidal ideation. He says he had a conflict with someone and was going to harm him but decided that he would kill himself so he does not harm anyone. He denies current homicidal ideation but endorses suicidal ideation with a plan to shoot himself. Pt says he does not own a gun but he has access to one. He says he drinks approximately 2 pints of liquor daily and says he wants treatment for alcohol use. He denies other substance use, however Pt's medical record indicates on 12/06/2022 he reported using an unknown amount of methamphetamine, crack cocaine, and fentanyl. He describes his mood as depressed and feels hopeless regarding his substance use and living situation. He reports sleeping approximately 5 hours before waking to drink alcohol. He denies auditory or visual hallucinations.   Pt is currently homeless and says he stays "here and there." He is on disability due to a heart condition and states he receives $946 a month, with his next check arriving in 5 days. He says he will spend at least $500-600 of his check on alcohol and they does not have money for a place to stay or basic needs. He says he cannot work or he will lose his disability check and therefore feels  trapped in a cycle of addiction and poverty. He says he is from [place] and came to [place] to stay with a friend, but that the situation did not work out. He identifies an aunt who is supportive. He denies history of abuse. He denies legal problems.   Pt has no mental health providers. He says he has been prescribed medications for HTN, his heart condition, and HIV but stopped taking all medications in 2022. He believes he is healthy and does not need medication. He denies having received substance abuse treatment in the past but medical record indicates he was hospitalized at a facility in [place] in 2023 and he was in [facility] in 2023.  Pt is casually dressed, alert and oriented x4. Pt speaks in a clear tone, at moderate volume and normal pace. Motor behavior appears normal. Eye contact is good. Pt's mood is depressed and affect is congruent with mood. Thought process is coherent and relevant. There is no indication he is currently responding to internal stimuli or experiencing delusional thought content. He says he wants to be admitted for treatment of alcohol use.  Chief Complaint:  Chief Complaint  Patient presents with   Alcohol Intoxication   suicidal ideation   Visit Diagnosis: F10.24 Alcohol-induced depressive disorder, with severe use disorder   CCA Screening, Triage and Referral (STR)  Patient Reported Information How did you hear about [place]? Legal System  What Is the Reason for Your Visit/Call Today? Pt presents to [facility] voluntarily, accompanied by [employer] with complaint of SI/HI. Pt is intoxicated and reports drinking a few pints of liquor and additional shots of tequila (  unable to remember specifically). Pt stated " I'm just going to kill myself so I don't hurt anybody". Pt then endorses homicidal ideation towards a man at the in the community tonight, he says he wants to shoot the man. He refused to say if he has access to a weapon/gun. Pt laughing and joking inappropriately. Pt denies  AVH.  How Long Has This Been Causing You Problems? 1 wk - 1 month  What Do You Feel Would Help You the Most Today? Alcohol or Drug Use Treatment; Treatment for Depression or other mood problem   Have You Recently Had Any Thoughts About Hurting Yourself? Yes  Are You Planning to Commit Suicide/Harm Yourself At This time? Yes   [place] ED from 12/13/2022 in [facility] ED from 12/11/2022 in [facility] Department at [facility] ED from 12/06/2022 in [place] High Risk Error: Q7 should not be populated when Q6 is No Low Risk       Have you Recently Had Thoughts About [facility]? Yes  Are You Planning to Harm Someone at This Time? No  Explanation: Pt reports suicidal ideation with thoughts of shooting himself. He reports experiencing homicidal ideation earlier tonight but denies current thoughts of harming others.   Have You Used Any Alcohol or Drugs in the Past 24 Hours? Yes  What Did You Use and How Much? Pt reports drinking 2 pints of liquor in the past 24 hours   Do You Currently Have a Therapist/Psychiatrist? No  Name of Therapist/Psychiatrist: Name of Therapist/Psychiatrist: No current outpatient providers   Have You Been Recently Discharged From Any Office Practice or Programs? Yes  Explanation of Discharge From Practice/Program: Discharged from [facility] 12/06/2022     CCA Screening Triage Referral Assessment Type of Contact: Face-to-Face  Telemedicine Service Delivery:   Is this Initial or Reassessment?   Date Telepsych consult ordered in [facility]:    Time Telepsych consult ordered in [facility]:    Location of Assessment: [facility] Assessment Services  Provider Location: [facility] Assessment Services   Collateral Involvement: None   Does Patient Have a Stage manager Guardian? No  Legal Guardian Contact Information: Pt does not have a legal guardian  Copy  of Legal Guardianship Form: -- (Pt does not have a legal guardian)  Legal Guardian Notified of Arrival: -- (Pt does not have a legal guardian)  Legal Guardian Notified of Pending Discharge: -- (Pt does not have a legal guardian)  If Minor and Not Living with Parent(s), Who has Custody? Pt is an adult  Is CPS involved or ever been involved? Never  Is APS involved or ever been involved? Never   Patient Determined To Be At Risk for Harm To Self or Others Based on Review of Patient Reported Information or Presenting Complaint? Yes, for Self-Harm (Pt reports suicidal ideation with thoughts of shooting himself. He reports experiencing homicidal ideation earlier tonight but denies current thoughts of harming others.)  Method: Plan with intent and identified person (Pt reports suicidal ideation with thoughts of shooting himself. He reports experiencing homicidal ideation earlier tonight but denies current thoughts of harming others.)  Availability of Means: Has close by (Pt reports suicidal ideation with thoughts of shooting himself. He reports experiencing homicidal ideation earlier tonight but denies current thoughts of harming others.)  Intent: Clearly intends on inflicting harm that could cause death (Pt reports suicidal ideation with thoughts of shooting himself. He reports  experiencing homicidal ideation earlier tonight but denies current thoughts of harming others.)  Notification Required: No need or identified person  Additional Information for Danger to Others Potential: -- (Pt denies history of violence)  Additional Comments for Danger to Others Potential: Pt denies history of violence  Are There Guns or Other Weapons in Your Home? No (No, but Pt says he can access a gun.)  Types of Guns/Weapons: Pt says he can access a gun  Are These Weapons Safely Secured?                            -- (Pt does not own a gun but says he can access a gun)  Who Could Verify You Are Able To Have  These Secured: Pt does not own a gun but says he can access a gun  Do You Have any Outstanding Charges, Pending Court Dates, Parole/Probation? Pt denies current legal problems  Contacted To Inform of Risk of Harm To Self or Others: Unable to Contact:    Does Patient Present under Involuntary Commitment? No    [place] of Residence: [place]   Patient Currently Receiving the Following Services: Not Receiving Services   Determination of Need: Urgent (48 hours)   Options For Referral: Other: Comment; [facility]; Outpatient Therapy     CCA Biopsychosocial Patient Reported Schizophrenia/Schizoaffective Diagnosis in Past: No   Strengths: Patient requesting detox   Mental Health Symptoms Depression:   Hopelessness   Duration of Depressive symptoms:  Duration of Depressive Symptoms: Greater than two weeks   Mania:   None   Anxiety:    None   Psychosis:   None   Duration of Psychotic symptoms:    Trauma:   None   Obsessions:   None   Compulsions:   None   Inattention:   None   Hyperactivity/Impulsivity:   None   Oppositional/Defiant Behaviors:   None   Emotional Irregularity:   None   Other Mood/Personality Symptoms:   None noted    Mental Status Exam Appearance and self-care  Stature:   Tall   Weight:   Average weight   Clothing:   Casual; Disheveled   Grooming:   Normal   Cosmetic use:   None   Posture/gait:   Normal   Motor activity:   Not Remarkable   Sensorium  Attention:   Normal   Concentration:   Normal   Orientation:   X5   Recall/memory:   Normal   Affect and Mood  Affect:   Appropriate   Mood:   Depressed   Relating  Eye contact:   Normal   Facial expression:   Responsive   Attitude toward examiner:   Cooperative   Thought and Language  Speech flow:  Normal   Thought content:   Appropriate to Mood and Circumstances   Preoccupation:   None   Hallucinations:   None    Organization:   Coherent; Medical laboratory scientific officer of Knowledge:   Average   Intelligence:   Average   Abstraction:   Normal   Judgement:   Fair   Art therapist:   Realistic   Insight:   Gaps   Decision Making:   Normal; Vacilates   Social Functioning  Social Maturity:   Isolates   Social Judgement:   "Street Smart"   Stress  Stressors:   Housing; Teacher, music Ability:   Deficient supports  Skill Deficits:   None   Supports:   Support needed     Religion: Religion/Spirituality Are You A Religious Person?: No How Might This Affect Treatment?: N/A  Leisure/Recreation: Leisure / Recreation Do You Have Hobbies?: No  Exercise/Diet: Exercise/Diet Do You Exercise?: No Have You Gained or Lost A Significant Amount of Weight in the Past Six Months?: No Do You Follow a Special Diet?: No Do You Have Any Trouble Sleeping?: Yes Explanation of Sleeping Difficulties: Sleeps 5 hours per night.   CCA Employment/Education Employment/Work Situation: Employment / Work Technical sales engineer: On disability Why is Patient on Disability: Due to heart failure How Long has Patient Been on Disability: Since age 68 Patient's Job has Been Impacted by Current Illness: No Has Patient ever Been in the [employer]?: No  Education: Education Is Patient Currently Attending School?: No Last Grade Completed: 12 Did You Attend College?: No Did You Have An Individualized Education Program (IIEP): No Did You Have Any Difficulty At School?: No Patient's Education Has Been Impacted by Current Illness: No   CCA Family/Childhood History Family and Relationship History: Family history Marital status: Single Does patient have children?: No  Childhood History:  Childhood History By whom was/is the patient raised?: Mother Did patient suffer any verbal/emotional/physical/sexual abuse as a child?: No Did patient suffer from severe childhood  neglect?: No Has patient ever been sexually abused/assaulted/raped as an adolescent or adult?: No Was the patient ever a victim of a crime or a disaster?: No Witnessed domestic violence?: No Has patient been affected by domestic violence as an adult?: No       CCA Substance Use Alcohol/Drug Use: Alcohol / Drug Use Pain Medications: See MAR Prescriptions: See MAR Over the Counter: See MAR History of alcohol / drug use?: Yes Longest period of sobriety (when/how long): 1 week Negative Consequences of Use: Personal relationships, Financial Withdrawal Symptoms: None Substance #1 Name of Substance 1: Alcohol 1 - Age of First Use: Adolescent 1 - Amount (size/oz): Approximately 2 pints of liquor 1 - Frequency: Daily 1 - Last Use / Amount: 12/12/2022, 2 pints of liquor 1 - Method of Aquiring: Purchase or given to him 1- Route of Use: Oral ingestion                       ASAM's:  Six Dimensions of Multidimensional Assessment  Dimension 1:  Acute Intoxication and/or Withdrawal Potential:   Dimension 1:  Description of individual's past and current experiences of substance use and withdrawal: Pt reports daily alcohol use. He denies history of withdrawal symptoms.  Dimension 2:  Biomedical Conditions and Complications:   Dimension 2:  Description of patient's biomedical conditions and  complications: Heart condition  Dimension 3:  Emotional, Behavioral, or Cognitive Conditions and Complications:  Dimension 3:  Description of emotional, behavioral, or cognitive conditions and complications: Pt reports depressive symptoms and suicidal ideation.  Dimension 4:  Readiness to Change:  Dimension 4:  Description of Readiness to Change criteria: Patient states he wants to detox  Dimension 5:  Relapse, Continued use, or Continued Problem Potential:  Dimension 5:  Relapse, continued use, or continued problem potential critiera description: Patient has a history of continued substance use   Dimension 6:  Recovery/Living Environment:  Dimension 6:  Recovery/Iiving environment criteria description: Patient is currently homeless  ASAM Severity Score: ASAM's Severity Rating Score: 10  ASAM Recommended Level of Treatment: ASAM Recommended Level of Treatment: Level III Residential Treatment   Substance use Disorder (  SUD) Substance Use Disorder (SUD)  Checklist Symptoms of Substance Use: Persistent desire or unsuccessful efforts to cut down or control use, Presence of craving or strong urge to use, Continued use despite persistent or recurrent social, interpersonal problems, caused or exacerbated by use, Recurrent use that results in a failure to fulfill major role obligations (work, school, home), Substance(s) often taken in larger amounts or over longer times than was intended, Large amounts of time spent to obtain, use or recover from the substance(s)  Recommendations for Services/Supports/Treatments: Recommendations for Services/Supports/Treatments Recommendations For Services/Supports/Treatments: IOP (Intensive Outpatient Program), Detox, Facility Based Crisis  Discharge Disposition:    DSM5 Diagnoses: Patient Active Problem List   Diagnosis Date Noted   Dental caries 09/13/2022   Substance abuse ([place]) 05/03/2022   Polysubstance abuse ([place]) 04/29/2022   Alcohol use disorder, severe, dependence ([place]) 03/16/2022   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/2022   Alcohol abuse    Vitamin B1 deficiency 09/19/2021   Adjustment disorder with depressed mood    History of CHF (congestive heart failure) 08/07/2021   H/O ETOH abuse 08/07/2021   Chronic HFrEF (heart failure with reduced ejection fraction) ([place]) 07/23/2021   HIV (human immunodeficiency virus infection) ([place]) 07/23/2021   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

## 2022-12-14 LAB — COMPREHENSIVE METABOLIC PANEL
ALT: 29 U/L (ref 0–44)
AST: 34 U/L (ref 15–41)
Albumin: 3.5 g/dL (ref 3.5–5.0)
Alkaline Phosphatase: 56 U/L (ref 38–126)
Anion gap: 11 (ref 5–15)
BUN: 10 mg/dL (ref 6–20)
CO2: 21 mmol/L — ABNORMAL LOW (ref 22–32)
Calcium: 8.8 mg/dL — ABNORMAL LOW (ref 8.9–10.3)
Chloride: 105 mmol/L (ref 98–111)
Creatinine, Ser: 0.95 mg/dL (ref 0.61–1.24)
GFR, Estimated: >60 mL/min (ref >60)
Glucose, Bld: 81 mg/dL (ref 70–99)
Potassium: 3.5 mmol/L (ref 3.5–5.1)
Sodium: 137 mmol/L (ref 135–145)
Total Bilirubin: 0.8 mg/dL (ref 0.3–1.2)
Total Protein: 8.5 g/dL — ABNORMAL HIGH (ref 6.5–8.1)

## 2022-12-14 LAB — CBC
HCT: 38.6 % — ABNORMAL LOW (ref 39.0–52.0)
Hemoglobin: 13.2 g/dL (ref 13.0–17.0)
MCH: 31.4 pg (ref 26.0–34.0)
MCHC: 34.2 g/dL (ref 30.0–36.0)
MCV: 91.9 fL (ref 80.0–100.0)
Platelets: 272 10*3/uL (ref 150–400)
RBC: 4.2 MIL/uL — ABNORMAL LOW (ref 4.22–5.81)
RDW: 14.4 % (ref 11.5–15.5)
WBC: 4 10*3/uL (ref 4.0–10.5)
nRBC: 0 % (ref 0.0–0.2)

## 2022-12-14 LAB — SALICYLATE LEVEL: Salicylate Lvl: <7 mg/dL — ABNORMAL LOW (ref 7.0–30.0)

## 2022-12-14 LAB — ETHANOL: Alcohol, Ethyl (B): 133 mg/dL — ABNORMAL HIGH (ref <10)

## 2022-12-14 LAB — ACETAMINOPHEN LEVEL: Acetaminophen (Tylenol), Serum: <10 ug/mL — ABNORMAL LOW (ref 10–30)

## 2022-12-14 NOTE — Discharge Instructions (Addendum)
You were seen today for alcohol intoxication.  You initially endorsed suicidal ideation; however, you were cleared by psychiatry.  See provided psychiatry resources.

## 2022-12-14 NOTE — ED Notes (Signed)
Pt asked for ginger ale, one was provided. The plan is being planned between psych NP and ER MD

## 2022-12-14 NOTE — BH Assessment (Signed)
Comprehensive Clinical Assessment (CCA) Note  12/14/2022 William Powers [id number]  Disposition: William Reasoner, NP recommends pt is psych cleared with substance use resources. Disposition discussed with Dr. Thayer Jew and [name]. William Mark, RN via [facility].  The patient demonstrates the following risk factors for suicide: Chronic risk factors for suicide include: psychiatric disorder of Alcohol use disorder, severe, dependence ([place]) and substance use disorder. Acute risk factors for suicide include:  Pt denies, SI . Protective factors for this patient include:  Pt denies, SI . Considering these factors, the overall suicide risk at this point appears to be low. Patient is appropriate for outpatient follow up.  William Powers is a 42 year old male who presents voluntary and unaccompanied [facility]. Clinician asked the pt, "what brought you to the hospital?" Pt reports, he had a mini seizure earlier tonight around 2300 and is unsure what caused it. Pt then reports, he's never had a seizure but "it felt like it was a seizure." Pt reports, he drank a lot of bleach, he then reports it wasn't much bleach he drank for a stupid reason. Clinician asked the pt to explain how he got the bleach and how much he drank. Pt reports, he didn't drink bleach. Pt repeated he did not drink bleach when clinician asked him to clarify. Pt reports, yesterday, he had passive suicidal thoughts with no plan, intent or means. Pt denies, symptoms of depression. Pt denies, current SI, HI, AVH, self-injurious behaviors and access to weapons.   Pt reports, drinking a couple pints of Marykay Lex [facility]). Pt's BAL was 133 at 2353. Pt's UDS is pending. Pt denies, being linked to OPT resources (medication management and/or counseling.) Pt reports, he has a bed and has been accepted to [facility] [place] he needs transportation. Pt reports, he comes back to [facility] when he has money for a hotel room but is currently  homeless. Pt reports, he's not linked to a Cardiologist but is diagnosed with heart failure. Per chart, pt was discharged from [facility] on 12/13/2022.   Pt presents alert hooked up to monitors with normal speech. Pt's mood was pleasant. Pt's affect was congruent. Pt's insight was fair. Pt's judgement is poor. Pt reports, he can contract for safety if discharged, he really wants to get into a rehab program.   Diagnosis: Alcohol use disorder, severe, dependence ([place]).  *Pt denies, having family, friend supports.*   Chief Complaint:  Chief Complaint  Patient presents with   Possible Seizure   Visit Diagnosis:     CCA Screening, Triage and Referral (STR)  Patient Reported Information How did you hear about [place]? Self  What Is the Reason for Your Visit/Call Today? program for his drinking. Pt reports, yesterday, he had passive suicidal thoughts with no plan, intent or means. Pt reports, he drank bleach then denied drinking bleach. Pt denies current SI, HI, AVH, self-injurious behaviors and access to weapons.  How Long Has This Been Causing You Problems? <Week  What Do You Feel Would Help You the Most Today? Alcohol or Drug Use Treatment; Housing Assistance; Social Support   Have You Recently Had Any Thoughts About [place]? Yes  Are You Planning to Commit Suicide/Harm Yourself At This time? No   Flowsheet Row ED from 12/13/2022 in [facility] Emergency Department at [facility] Most recent reading at 12/14/2022  5:32 AM ED from 12/13/2022 in [facility] Most recent reading at 12/13/2022  4:13 AM ED from 12/11/2022 in  [place] [facility] Most recent reading at 12/11/2022  2:03 AM  C-SSRS RISK CATEGORY Low Risk High Risk Error: Q7 should not be populated when Q6 is No       Have you Recently Had Thoughts About [place]? No  Are You Planning to Harm Someone at This Time? No  Explanation: Pt  denies, HI.   Have You Used Any Alcohol or Drugs in the Past 24 Hours? Yes  What Did You Use and How Much? Pt reports, drinking a couple pints of Marykay Lex [facility]).   Do You Currently Have a Therapist/Psychiatrist? No  Name of Therapist/Psychiatrist: Name of Therapist/Psychiatrist: Pt denies, being linked to outpatient resources.   Have You Been Recently Discharged From Any Office Practice or Programs? Yes  Explanation of Discharge From Practice/Program: Pt was discharged from [facility] on 12/13/2022.     CCA Screening Triage Referral Assessment Type of Contact: Tele-Assessment  Telemedicine Service Delivery: Telemedicine service delivery: This service was provided via telemedicine using a 2-way, interactive audio and video technology  Is this Initial or Reassessment? Is this Initial or Reassessment?: Initial Assessment  Date Telepsych consult ordered in [facility]:  Date Telepsych consult ordered in [facility]: 12/14/22  Time Telepsych consult ordered in [facility]:  Time Telepsych consult ordered in [facility]: 0324  Location of Assessment: [facility] Assessment Services  Provider Location: [facility] Assessment Services   Collateral Involvement: None. Pt denies, having supports.   Does Patient Have a Stage manager Guardian? No  Legal Guardian Contact Information: Pt is his own guardian.  Copy of Legal Guardianship Form: No - copy requested  Legal Guardian Notified of Arrival: -- (Pt is his own guardian.)  Legal Guardian Notified of Pending Discharge: -- (Pt is his own guardian.)  If Minor and Not Living with Parent(s), Who has Custody? Pt is his own guardian.  Is CPS involved or ever been involved? Never  Is APS involved or ever been involved? Never   Patient Determined To Be At Risk for Harm To Self or Others Based on Review of Patient Reported Information or Presenting Complaint? No  Method: No Plan  Availability of Means: No access or NA  Intent: Vague intent or  NA  Notification Required: No need or identified person  Additional Information for Danger to Others Potential: -- (Pt denies, HI.)  Additional Comments for Danger to Others Potential: Pt denies, HI.  Are There Guns or Other Weapons in [place]? No  Types of Guns/Weapons: Pt denies, access to weapons.  Are These Weapons Safely Secured?                            -- (Pt denies, access to weapons.)  Who Could Verify You Are Able To Have These Secured: Pt denies, access to weapons.  Do You Have any Outstanding Charges, Pending Court Dates, Parole/Probation? Pt denies, legal involvement.  Contacted To Inform of Risk of Harm To Self or Others: Other: Comment (Pt denies, SI/HI.)    Does Patient Present under Involuntary Commitment? No    [place] of Residence: Kathleen Argue (Pt reports, he's back and forth between [place] and [place].)   Patient Currently Receiving the Following Services: Not Receiving Services   Determination of Need: Routine (7 days)   Options For Referral: Partial Hospitalization; Outpatient Therapy; Other: Comment (Rehab Program.)     CCA Biopsychosocial Patient Reported Schizophrenia/Schizoaffective Diagnosis in Past: No   Strengths: Pt want  to be linked to a rehab program for assistance with his drinking.   Mental Health Symptoms Depression:   None (Pt denies, depression symptoms.)   Duration of Depressive symptoms:  Duration of Depressive Symptoms: N/A   Mania:   None   Anxiety:    Worrying   Psychosis:   None   Duration of Psychotic symptoms:    Trauma:   None   Obsessions:   None   Compulsions:   None   Inattention:   None   Hyperactivity/Impulsivity:   None   Oppositional/Defiant Behaviors:   None   Emotional Irregularity:   Recurrent suicidal behaviors/gestures/threats   Other Mood/Personality Symptoms:   None.    Mental Status Exam Appearance and self-care  Stature:   Tall   Weight:   Average weight    Clothing:   Casual   Grooming:   Normal   Cosmetic use:   None   Posture/gait:   Normal   Motor activity:   Not Remarkable   Sensorium  Attention:   Normal   Concentration:   Normal   Orientation:   X5   Recall/memory:   Normal   Affect and Mood  Affect:   Congruent   Mood:   Other (Comment) (Pleasant.)   Relating  Eye contact:   Normal   Facial expression:   Responsive   Attitude toward examiner:   Cooperative   Thought and Language  Speech flow:  Normal   Thought content:   Personalizations   Preoccupation:   None   Hallucinations:   None   Organization:   Coherent   [employer] of Knowledge:   Fair   Intelligence:   Average   Abstraction:   Normal   Judgement:   Poor   Reality Testing:   Realistic   Insight:   Fair   Decision Making:   Impulsive   Social Functioning  Social Maturity:   Impulsive   Social Judgement:   "Street Smart"   Stress  Stressors:   Financial   Coping Ability:   Deficient supports   Skill Deficits:   Decision making; Self-care; Responsibility   Supports:   Support needed     Religion: Religion/Spirituality Are You A Religious Person?: No How Might This Affect Treatment?: None.  Leisure/Recreation: Leisure / Recreation Do You Have Hobbies?: No  Exercise/Diet: Exercise/Diet Do You Exercise?: No Have You Gained or Lost A Significant Amount of Weight in the Past Six Months?: No Do You Follow a Special Diet?: No Do You Have Any Trouble Sleeping?: No Explanation of Sleeping Difficulties: Pt repors, his sleep is on and off because he's homeless.   CCA Employment/Education Employment/Work Situation: Employment / Work Situation Employment Situation: On disability Why is Patient on Disability: Heart failure. How Long has Patient Been on Disability: Since 5. Patient's Job has Been Impacted by Current Illness: No Has Patient ever Been in the [employer]?:  No  Education: Education Is Patient Currently Attending School?: No Last Grade Completed: 10 Did You Attend College?: No Did You Have An Individualized Education Program (IIEP): No Did You Have Any Difficulty At School?: No Patient's Education Has Been Impacted by Current Illness: No   CCA Family/Childhood History Family and Relationship History: Family history Marital status: Single Does patient have children?: No  Childhood History:  Childhood History By whom was/is the patient raised?: Mother Did patient suffer any verbal/emotional/physical/sexual abuse as a child?: No Did patient suffer from severe childhood neglect?: No Has patient ever  been sexually abused/assaulted/raped as an adolescent or adult?: No Was the patient ever a victim of a crime or a disaster?: No Witnessed domestic violence?: No Has patient been affected by domestic violence as an adult?: No       CCA Substance Use Alcohol/Drug Use: Alcohol / Drug Use Pain Medications: See MAR Prescriptions: See MAR Over the Counter: See MAR History of alcohol / drug use?: Yes Longest period of sobriety (when/how long): Per pt, 1 year. Negative Consequences of Use: Financial Withdrawal Symptoms: None Substance #1 Name of Substance 1: Alcohol. 1 - Age of First Use: 40.. 1 - Amount (size/oz): Pt reports, drinking a couple pints of Marykay Lex [facility]). Pt's BAL was 133 at 2353. 1 - Frequency: Per pt, everyday. 1 - Duration: Ongoing. 1 - Last Use / Amount: 12/13/2022. 1 - Method of Aquiring: Purchase. 1- Route of Use: Oral.    ASAM's:  Six Dimensions of Multidimensional Assessment  Dimension 1:  Acute Intoxication and/or Withdrawal Potential:   Dimension 1:  Description of individual's past and current experiences of substance use and withdrawal: Pt denies, experiencing withdrawal symptoms.  Dimension 2:  Biomedical Conditions and Complications:   Dimension 2:  Description of patient's biomedical conditions  and  complications: Pt has heart failure, he's not linked to a Film/video editor.  Dimension 3:  Emotional, Behavioral, or Cognitive Conditions and Complications:  Dimension 3:  Description of emotional, behavioral, or cognitive conditions and complications: Pt is diagnosed with Alcohol use disorder, severe, dependence ([place]), Recurrent major depressive episodes, moderate ([place]).  Dimension 4:  Readiness to Change:  Dimension 4:  Description of Readiness to Change criteria: Pt reports, he's linked to [facility] [place] he needs transportation to get there. Pt reports, he tried following up with resources yesterday that was given at discharge from [facility] but he had no luck.  Dimension 5:  Relapse, Continued use, or Continued Problem Potential:  Dimension 5:  Relapse, continued use, or continued problem potential critiera description: Pt reports, he uses everyday.  Dimension 6:  Recovery/Living Environment:  Dimension 6:  Recovery/Iiving environment criteria description: Pt is homeless and is back and forth from [place] to [place]. Pt denies, having supports.  ASAM Severity Score: ASAM's Severity Rating Score: 12  ASAM Recommended Level of Treatment: ASAM Recommended Level of Treatment: Level II Intensive Outpatient Treatment   Substance use Disorder (SUD) Substance Use Disorder (SUD)  Checklist Symptoms of Substance Use: Continued use despite having a persistent/recurrent physical/psychological problem caused/exacerbated by use, Continued use despite persistent or recurrent social, interpersonal problems, caused or exacerbated by use, Evidence of tolerance  Recommendations for Services/Supports/Treatments: Recommendations for Services/Supports/Treatments Recommendations For Services/Supports/Treatments: Residential-Level 1, Peer Support Services, Partial Hospitalization, CST Marine scientist), Other (Comment) (Rehab program.)  Discharge Disposition: Discharge Disposition Medical Exam  completed: Yes  DSM5 Diagnoses: Patient Active Problem List   Diagnosis Date Noted   Dental caries 09/13/2022   Substance abuse ([place]) 05/03/2022   Polysubstance abuse ([place]) 04/29/2022   Alcohol use disorder, severe, dependence ([place]) 03/16/2022   Recurrent major depressive episodes, moderate ([place]) 02/23/2022   Alcohol abuse    Vitamin B1 deficiency 09/19/2021   Adjustment disorder with depressed mood    History of CHF (congestive heart failure) 08/07/2021   H/O ETOH abuse 08/07/2021   Chronic HFrEF (heart failure with reduced ejection fraction) ([place]) 07/23/2021   HIV (human immunodeficiency virus infection) ([place]) 07/23/2021   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   Tinea versicolor 09/17/2018   History of  tobacco abuse 09/16/2018   Dilated cardiomyopathy ([place]) 09/13/2018     Referrals to Alternative Service(s): Referred to Alternative Service(s):   Place:   Date:   Time:    Referred to Alternative Service(s):   Place:   Date:   Time:    Referred to Alternative Service(s):   Place:   Date:   Time:    Referred to Alternative Service(s):   Place:   Date:   Time:     Vertell Novak, [facility] Comprehensive Clinical Assessment (CCA) Screening, Triage and Referral Note  12/14/2022 William Powers [id number]  Chief Complaint:  Chief Complaint  Patient presents with   Possible Seizure   Visit Diagnosis:   Patient Reported Information How did you hear about [place]? Self  What Is the Reason for Your Visit/Call Today? program for his drinking. Pt reports, yesterday, he had passive suicidal thoughts with no plan, intent or means. Pt reports, he drank bleach then denied drinking bleach. Pt denies current SI, HI, AVH, self-injurious behaviors and access to weapons.  How Long Has This Been Causing You Problems? <Week  What Do You Feel Would Help You the Most Today? Alcohol or Drug Use Treatment; Housing Assistance; Social Support   Have You Recently Had Any Thoughts  About [place]? Yes  Are You Planning to Commit Suicide/Harm Yourself At This time? No   Have you Recently Had Thoughts About [place]? No  Are You Planning to Harm Someone at This Time? No  Explanation: Pt denies, HI.   Have You Used Any Alcohol or Drugs in the Past 24 Hours? Yes  How Long Ago Did You Use Drugs or Alcohol? On 12/13/2022. What Did You Use and How Much? Pt reports, drinking a couple pints of Marykay Lex [facility]).   Do You Currently Have a Therapist/Psychiatrist? No  Name of Therapist/Psychiatrist: Pt denies, being linked to outpatient resources.   Have You Been Recently Discharged From Any Office Practice or Programs? Yes  Explanation of Discharge From Practice/Program: Pt was discharged from [facility] on 12/13/2022.    CCA Screening Triage Referral Assessment Type of Contact: Tele-Assessment  Telemedicine Service Delivery: Telemedicine service delivery: This service was provided via telemedicine using a 2-way, interactive audio and video technology  Is this Initial or Reassessment? Is this Initial or Reassessment?: Initial Assessment  Date Telepsych consult ordered in [facility]:  Date Telepsych consult ordered in [facility]: 12/14/22  Time Telepsych consult ordered in [facility]L:  Time Telepsych consult ordered in [facility]: 0324  Location of Assessment: [facility] Assessment Services  Provider Location: [facility] Assessment Services    Collateral Involvement: None. Pt denies, having supports.   Does Patient Have a Stage manager Guardian? Pt is his own guardian. Name and Contact of Legal Guardian: Pt is his own guardian. If Minor and Not Living with Parent(s), Who has Custody? Pt is his own guardian.  Is CPS involved or ever been involved? Never  Is APS involved or ever been involved? Never   Patient Determined To Be At Risk for Harm To Self or Others Based on Review of Patient Reported Information or Presenting Complaint? No  Method: No  Plan  Availability of Means: No access or NA  Intent: Vague intent or NA  Notification Required: No need or identified person  Additional Information for Danger to Others Potential: -- (Pt denies, HI.)  Additional Comments for Danger to Others Potential: Pt denies, HI.  Are There Guns or Other Weapons in [place]? No  Types of Guns/Weapons: Pt denies, access to weapons.  Are These Weapons Safely Secured?                            -- (Pt denies, access to weapons.)  Who Could Verify You Are Able To Have These Secured: Pt denies, access to weapons.  Do You Have any Outstanding Charges, Pending Court Dates, Parole/Probation? Pt denies, legal involvement.  Contacted To Inform of Risk of Harm To Self or Others: Other: Comment (Pt denies, SI/HI.)   Does Patient Present under Involuntary Commitment? No    [place] of Residence: Kathleen Argue (Pt reports, he's back and forth between [place] and [place].)   Patient Currently Receiving the Following Services: Not Receiving Services   Determination of Need: Routine (7 days)   Options For Referral: Partial Hospitalization; Outpatient Therapy; Other: Comment (Rehab Program.)   Discharge Disposition:  Discharge Disposition Medical Exam completed: Yes  Vertell Novak, [place], [place], [facility], [facility] Triage Specialist [phone]

## 2022-12-14 NOTE — BH Assessment (Signed)
Clinician messaged [name]. [name], RN: "Hey. It's Trey with TTS. Is the pt able to engage in the assessment, if so the pt will need to be placed in a private room. Is the pt under IVC? Also is the pt medically cleared?"   Clinician awaiting response.    Vertell Novak, [place], [facility], [facility] Triage Specialist [phone]

## 2022-12-14 NOTE — ED Notes (Signed)
Pt sleeping. No distress. Vitals stable.
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
## 2022-12-14 NOTE — ED Notes (Signed)
Pt claims SI states he drank two styrofoam cups of bleach. He said he did so three hours ago. Pt has no symptoms at this time. Poison controlled called. Recommendations sent to MD. And charge nurse made aware.

## 2022-12-17 ENCOUNTER — Emergency Department (HOSPITAL_COMMUNITY)
Admission: EM | Admit: 2022-12-17 | Discharge: 2022-12-18 | Disposition: A | Discharge status: Home / Self Care | Payer: No Typology Code available for payment source | Attending: Emergency Medicine | Admitting: Emergency Medicine

## 2022-12-17 DIAGNOSIS — Y906 Blood alcohol level of 120-199 mg/100 ml: Secondary | ICD-10-CM | POA: Insufficient documentation

## 2022-12-17 DIAGNOSIS — F1011 Alcohol abuse, in remission: Secondary | ICD-10-CM | POA: Diagnosis present

## 2022-12-17 DIAGNOSIS — Z5900 Homelessness unspecified: Secondary | ICD-10-CM | POA: Insufficient documentation

## 2022-12-17 DIAGNOSIS — F10929 Alcohol use, unspecified with intoxication, unspecified: Secondary | ICD-10-CM | POA: Diagnosis not present

## 2022-12-17 DIAGNOSIS — R45851 Suicidal ideations: Secondary | ICD-10-CM | POA: Diagnosis not present

## 2022-12-17 DIAGNOSIS — F1010 Alcohol abuse, uncomplicated: Secondary | ICD-10-CM | POA: Insufficient documentation

## 2022-12-17 LAB — COMPREHENSIVE METABOLIC PANEL
ALT: 25 U/L (ref 0–44)
AST: 37 U/L (ref 15–41)
Albumin: 3.9 g/dL (ref 3.5–5.0)
Alkaline Phosphatase: 54 U/L (ref 38–126)
Anion gap: 8 (ref 5–15)
BUN: 13 mg/dL (ref 6–20)
CO2: 20 mmol/L — ABNORMAL LOW (ref 22–32)
Calcium: 8.1 mg/dL — ABNORMAL LOW (ref 8.9–10.3)
Chloride: 105 mmol/L (ref 98–111)
Creatinine, Ser: 0.97 mg/dL (ref 0.61–1.24)
GFR, Estimated: >60 mL/min (ref >60)
Glucose, Bld: 89 mg/dL (ref 70–99)
Potassium: 3.2 mmol/L — ABNORMAL LOW (ref 3.5–5.1)
Sodium: 133 mmol/L — ABNORMAL LOW (ref 135–145)
Total Bilirubin: 0.5 mg/dL (ref 0.3–1.2)
Total Protein: 9 g/dL — ABNORMAL HIGH (ref 6.5–8.1)

## 2022-12-17 LAB — CBC WITH DIFFERENTIAL/PLATELET
Abs Immature Granulocytes: 0.02 10*3/uL (ref 0.00–0.07)
Basophils Absolute: 0 10*3/uL (ref 0.0–0.1)
Basophils Relative: 0 %
Eosinophils Absolute: 0.1 10*3/uL (ref 0.0–0.5)
Eosinophils Relative: 3 %
HCT: 40 % (ref 39.0–52.0)
Hemoglobin: 13.4 g/dL (ref 13.0–17.0)
Immature Granulocytes: 0 %
Lymphocytes Relative: 31 %
Lymphs Abs: 1.5 10*3/uL (ref 0.7–4.0)
MCH: 31.2 pg (ref 26.0–34.0)
MCHC: 33.5 g/dL (ref 30.0–36.0)
MCV: 93.2 fL (ref 80.0–100.0)
Monocytes Absolute: 0.5 10*3/uL (ref 0.1–1.0)
Monocytes Relative: 11 %
Neutro Abs: 2.6 10*3/uL (ref 1.7–7.7)
Neutrophils Relative %: 55 %
Platelets: 296 10*3/uL (ref 150–400)
RBC: 4.29 MIL/uL (ref 4.22–5.81)
RDW: 14.6 % (ref 11.5–15.5)
WBC: 4.8 10*3/uL (ref 4.0–10.5)
nRBC: 0 % (ref 0.0–0.2)

## 2022-12-17 LAB — SALICYLATE LEVEL: Salicylate Lvl: <7 mg/dL — ABNORMAL LOW (ref 7.0–30.0)

## 2022-12-17 LAB — CBG MONITORING, ED: Glucose-Capillary: 89 mg/dL (ref 70–99)

## 2022-12-17 LAB — ACETAMINOPHEN LEVEL: Acetaminophen (Tylenol), Serum: <10 ug/mL — ABNORMAL LOW (ref 10–30)

## 2022-12-17 LAB — ETHANOL: Alcohol, Ethyl (B): 198 mg/dL — ABNORMAL HIGH (ref <10)

## 2022-12-17 NOTE — ED Notes (Signed)
Pt. Made aware for the need of urine specimen. 
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
## 2022-12-18 DIAGNOSIS — F1011 Alcohol abuse, in remission: Secondary | ICD-10-CM

## 2022-12-18 LAB — RAPID URINE DRUG SCREEN, HOSP PERFORMED
Amphetamines: NOT DETECTED
Barbiturates: NOT DETECTED
Benzodiazepines: NOT DETECTED
Cocaine: NOT DETECTED
Opiates: NOT DETECTED
Tetrahydrocannabinol: NOT DETECTED

## 2022-12-18 MED ORDER — POTASSIUM CHLORIDE CRYS ER 20 MEQ PO TBCR
40.0000 meq | EXTENDED_RELEASE_TABLET | Freq: Once | ORAL | Status: AC
  Administered 2022-12-18: 40 meq via ORAL
  Filled 2022-12-18: qty 2

## 2022-12-18 NOTE — ED Notes (Addendum)
Urine sent. Pt amiable, jovial, alert, NAD, calm, interactive. Denies SI/HI. Eating breakfast. States, "I'm going to leave here and go straight to shelter, and get myself straightened out".

## 2022-12-18 NOTE — Discharge Instructions (Addendum)

## 2022-12-18 NOTE — ED Notes (Signed)
Pt reminded about urine sample that's needed. Pt stated he does not need to go at this moment.

## 2022-12-18 NOTE — ED Notes (Signed)
Pt. In burgundy scrubs and wanded by security. Pt. Has 1 belongings bag. Pt. Has 1 black cell phone, 1 black sweat pant, 1 black shirt, 1 pr. Black/white snickers, no wallet and 1-1/2 cigarette/cigar, 1 pr. White socks, 1 black hat, 1 black jacket. Pt. Belongings locked up in cabinet 16-18 behind the nurses station.

## 2022-12-18 NOTE — Consult Note (Signed)
[facility] ED ASSESSMENT   Reason for Consult:  Psych Consult  Referring Physician:  Montine Circle PA-C Patient Identification: William Powers MRN:  [id number] ED Chief Complaint: H/O ETOH abuse  Diagnosis:  Principal Problem:   H/O ETOH abuse Active Problems:   Suicidal ideation   ED Assessment Time Calculation: Start Time: 64 Stop Time: 1020 Total Time in Minutes (Assessment Completion): 40   HPI:  Per Triage Note "[place]. Intoxicated. Endorses SI with EMS. Belligerent on arrival.  Subjective:  William Powers, 42 y.o., male patient seen face to face by this provider, consulted with Dr. Dwyane Dee; and chart reviewed on 12/18/22.  On evaluation William Powers reports that William Powers is fine, and William Powers is ready to be discharged. Patient states that William Powers is going to [place], to go to [facility]. Denies SI/HI/AVH. Patient says his appetite and sleep are good. Patient appears to be a little agitated, and anxious to leave. When provider confronted him about his agitation, William Powers was able to calm down and inform provider William Powers had a bed at [facility] in [place] and that William Powers had to be on a bus that leaves in about a hour.    During evaluation William Powers is sitting on his hospital bed in no acute distress. William Powers is alert, oriented x 3, calm, cooperative and attentive. His mood is euthymic with congruent affect.  William Powers has normal speech, and behavior.  Objectively there is no evidence of psychosis/mania or delusional thinking.  Patient is able to converse coherently, goal directed thoughts, no distractibility, or pre-occupation. William Powers also denies suicidal/self-harm/homicidal ideation, psychosis, and paranoia. Patient has relaxed and is calm and appropriate, denying SI/HI/AVH. No acute distress noted.    At this time William Powers is educated and verbalizes understanding of mental health resources and other crisis services in the community. William Powers is instructed to call 911 and  present to the nearest emergency room should William Powers experience any suicidal/homicidal ideation, auditory/visual/hallucinations, or detrimental worsening of his mental health condition.  Past Psychiatric History: Alcohol abuse  Risk to Self or Others: Is the patient at risk to self? No Has the patient been a risk to self in the past 6 months? No Has the patient been a risk to self within the distant past? No Is the patient a risk to others? No Has the patient been a risk to others in the past 6 months? No Has the patient been a risk to others within the distant past? No  [place] Scale:  [place] [facility] from 12/17/2022 in [facility] Emergency Department at [facility] Most recent reading at 12/18/2022 10:20 AM ED from 12/13/2022 in [facility] Emergency Department at [facility] Most recent reading at 12/14/2022  5:32 AM ED from 12/13/2022 in [facility] Most recent reading at 12/13/2022  4:13 AM  C-SSRS RISK CATEGORY Moderate Risk Low Risk High Risk       AIMS:  , , ,  ,   ASAM:    Substance Abuse:  Alcohol / Drug Use Pain Medications: See MAR Prescriptions: See MAR Over the Counter: See MAR History of alcohol / drug use?: Yes Longest period of sobriety (when/how long): Unknown Negative Consequences of Use:  (N/A)  Past Medical History:  Past Medical History:  Diagnosis Date   Heart failure ([place])    HFrEF (heart failure with reduced ejection fraction) ([place])    15% EF as of 2019   HIV (  human immunodeficiency virus infection) ([place])    Hypertension    Polysubstance abuse ([place])    Pulmonary embolism ([place])    Single subsegmental pulmonary embolism without acute cor pulmonale ([place]) 07/23/2021    Past Surgical History:  Procedure Laterality Date   HERNIA REPAIR     Family History:  Family History  Problem Relation Age of Onset   Lupus Mother    HIV Mother    HIV Father     Social History:  Social History   Substance and  Sexual Activity  Alcohol Use Yes   Comment: 10 pints per week     Social History   Substance and Sexual Activity  Drug Use Yes   Types: Marijuana, Cocaine    Social History   Socioeconomic History   Marital status: Divorced    Spouse name: Not on file   Number of children: Not on file   Years of education: Not on file   Highest education level: Not on file  Occupational History   Not on file  Tobacco Use   Smoking status: Some Days    Packs/day: 0.50    Types: Cigarettes, Cigars   Smokeless tobacco: Never  Vaping Use   Vaping Use: Never used  Substance and Sexual Activity   Alcohol use: Yes    Comment: 10 pints per week   Drug use: Yes    Types: Marijuana, Cocaine   Sexual activity: Not on file  Other Topics Concern   Not on file  Social History Narrative   Not on file   Social Determinants of Health   Financial Resource Strain: Not on file  Food Insecurity: Not on file  Transportation Needs: Not on file  Physical Activity: Not on file  Stress: Not on file  Social Connections: Not on file   Additional Social History:    Allergies:   Allergies  Allergen Reactions   Bee Venom Anaphylaxis   Lisinopril Swelling    Reported on 5/14 by him to his my chart account : was in ED for swollen lips     Sulfamethoxazole-Trimethoprim Swelling and Rash    Labs:  Results for orders placed or performed during the hospital encounter of 12/17/22 (from the past 48 hour(s))  Urine rapid drug screen (hosp performed)     Status: None   Collection Time: 12/17/22  8:45 AM  Result Value Ref Range   Opiates NONE DETECTED NONE DETECTED   Cocaine NONE DETECTED NONE DETECTED   Benzodiazepines NONE DETECTED NONE DETECTED   Amphetamines NONE DETECTED NONE DETECTED   Tetrahydrocannabinol NONE DETECTED NONE DETECTED   Barbiturates NONE DETECTED NONE DETECTED    Comment: (NOTE) DRUG SCREEN FOR MEDICAL PURPOSES ONLY.  IF CONFIRMATION IS NEEDED FOR ANY PURPOSE, NOTIFY LAB WITHIN  5 DAYS.  LOWEST DETECTABLE LIMITS FOR URINE DRUG SCREEN Drug Class                     Cutoff (ng/mL) Amphetamine and metabolites    1000 Barbiturate and metabolites    200 Benzodiazepine                 200 Opiates and metabolites        300 Cocaine and metabolites        300 THC                            50 Performed at [facility], [place]  [address] [place], [place] [address]   CBG monitoring, ED     Status: None   Collection Time: 12/17/22 11:09 PM  Result Value Ref Range   Glucose-Capillary 89 70 - 99 mg/dL    Comment: Glucose reference range applies only to samples taken after fasting for at least 8 hours.   Comment 1 Notify RN   Comprehensive metabolic panel     Status: Abnormal   Collection Time: 12/17/22 11:14 PM  Result Value Ref Range   Sodium 133 (L) 135 - 145 mmol/L   Potassium 3.2 (L) 3.5 - 5.1 mmol/L   Chloride 105 98 - 111 mmol/L   CO2 20 (L) 22 - 32 mmol/L   Glucose, Bld 89 70 - 99 mg/dL    Comment: Glucose reference range applies only to samples taken after fasting for at least 8 hours.   BUN 13 6 - 20 mg/dL   Creatinine, Ser 0.97 0.61 - 1.24 mg/dL   Calcium 8.1 (L) 8.9 - 10.3 mg/dL   Total Protein 9.0 (H) 6.5 - 8.1 g/dL   Albumin 3.9 3.5 - 5.0 g/dL   AST 37 15 - 41 U/L   ALT 25 0 - 44 U/L   Alkaline Phosphatase 54 38 - 126 U/L   Total Bilirubin 0.5 0.3 - 1.2 mg/dL   GFR, Estimated >60 >60 mL/min    Comment: (NOTE) Calculated using the CKD-EPI Creatinine Equation (2021)    Anion gap 8 5 - 15    Comment: Performed at [facility], [place] [address], [place], [place] [id number]  Salicylate level     Status: Abnormal   Collection Time: 12/17/22 11:14 PM  Result Value Ref Range   Salicylate Lvl Q000111Q (L) 7.0 - 30.0 mg/dL    Comment: Performed at [facility], [place] [address], [place], [place] [address]  Acetaminophen level     Status: Abnormal   Collection Time: 12/17/22 11:14 PM  Result Value Ref  Range   Acetaminophen (Tylenol), Serum <10 (L) 10 - 30 ug/mL    Comment: (NOTE) Therapeutic concentrations vary significantly. A range of 10-30 ug/mL  may be an effective concentration for many patients. However, some  are best treated at concentrations outside of this range. Acetaminophen concentrations >150 ug/mL at 4 hours after ingestion  and >50 ug/mL at 12 hours after ingestion are often associated with  toxic reactions.  Performed at [facility], [place] [address], [place], [place] [address]   Ethanol     Status: Abnormal   Collection Time: 12/17/22 11:14 PM  Result Value Ref Range   Alcohol, Ethyl (B) 198 (H) <10 mg/dL    Comment: (NOTE) Lowest detectable limit for serum alcohol is 10 mg/dL.  For medical purposes only. Performed at [facility] [place] [address], [place],  [address]   CBC WITH DIFFERENTIAL     Status: None   Collection Time: 12/17/22 11:14 PM  Result Value Ref Range   WBC 4.8 4.0 - 10.5 K/uL   RBC 4.29 4.22 - 5.81 MIL/uL   Hemoglobin 13.4 13.0 - 17.0 g/dL   HCT 40.0 39.0 - 52.0 %   MCV 93.2 80.0 - 100.0 fL   MCH 31.2 26.0 - 34.0 pg   MCHC 33.5 30.0 - 36.0 g/dL   RDW 14.6 11.5 - 15.5 %   Platelets 296 150 - 400 K/uL   nRBC 0.0 0.0 - 0.2 %   Neutrophils Relative % 55 %   Neutro  Abs 2.6 1.7 - 7.7 K/uL   Lymphocytes Relative 31 %   Lymphs Abs 1.5 0.7 - 4.0 K/uL   Monocytes Relative 11 %   Monocytes Absolute 0.5 0.1 - 1.0 K/uL   Eosinophils Relative 3 %   Eosinophils Absolute 0.1 0.0 - 0.5 K/uL   Basophils Relative 0 %   Basophils Absolute 0.0 0.0 - 0.1 K/uL   Immature Granulocytes 0 %   Abs Immature Granulocytes 0.02 0.00 - 0.07 K/uL    Comment: Performed at [facility], [place] [address] [place], [place] [address]    No current facility-administered medications for this encounter.   No current outpatient medications on file.    Musculoskeletal: Strength & Muscle Tone: within  normal limits Gait & Station: normal Patient leans: N/A   Psychiatric Specialty Exam: Presentation  General Appearance:  Appropriate for Environment  Eye Contact: Fleeting  Speech: Clear and Coherent  Speech Volume: Normal  Handedness: Right   Mood and Affect  Mood: Irritable  Affect: Appropriate   Thought Process  Thought Processes: Coherent  Descriptions of Associations:Intact  Orientation:Full (Time, Place and Person)  Thought Content:WDL  History of Schizophrenia/Schizoaffective disorder:No  Duration of Psychotic Symptoms:N/A  Hallucinations:Hallucinations: None  Ideas of Reference:None  Suicidal Thoughts:Suicidal Thoughts: No  Homicidal Thoughts:Homicidal Thoughts: No   Sensorium  Memory: Immediate Fair; Recent Fair  Judgment: Fair  Insight: Fair   Community education officer  Concentration: Fair  Attention Span: Fair  Recall: [employer] of Knowledge: Fair  Language: Fair   Psychomotor Activity  Psychomotor Activity: Psychomotor Activity: Normal   Assets  Assets: Communication Skills; Social Support    Sleep  Sleep: Sleep: Fair   Physical Exam: Physical Exam HENT:     Nose: Nose normal.  Eyes:     Pupils: Pupils are equal, round, and reactive to light.  Cardiovascular:     Rate and Rhythm: Normal rate.  Musculoskeletal:        General: Normal range of motion.  Neurological:     Mental Status: William Powers is alert.  Psychiatric:        Mood and Affect: Mood normal.        Behavior: Behavior normal.        Thought Content: Thought content normal.        Judgment: Judgment normal.    Review of Systems  Cardiovascular: Negative.   Skin: Negative.   Psychiatric/Behavioral: Negative.     Blood pressure (!) 148/92, pulse 78, temperature 97.7 F (36.5 C), temperature source Oral, resp. rate 16, SpO2 99 %. There is no height or weight on file to calculate BMI.  Medical Decision Making: Patient case review and  discussed with Dr. Dwyane Dee, and patient does not meet inpatient criteria for inpatient psychiatric treatment. Patient denies SI, HI, AVH and is able to contract for safety. William Powers demonstrated no overt evidence of psychosis or mania. Prior to discharge, patient verbalized that William Powers understood warning signs, triggers, and symptoms of worsening mental health and how to access emergency mental health care if they felt it was needed. Patient was instructed to call 911 or return to the emergency room if they experienced any concerning symptoms after discharge. Patient voiced understanding and agreed to the above. Patient plan is to go to [facility] and go [facility].   Disposition: Patient does not meet criteria for psychiatric inpatient admission. Supportive therapy provided about ongoing stressors. Discussed crisis plan, support from social network, calling 911, coming to the Emergency Department, and  calling Suicide Hotline.  Esli Jernigan MOTLEY-MANGRUM, PMHNP 12/18/2022 10:53 AM

## 2022-12-18 NOTE — ED Notes (Signed)
Report received. Pt has been belligerent. Pt currently sleeping prone, NAD, calm, rise and fall of chest noted, repositions self, door open, visualized from nurses station. Pending re-TTS.

## 2022-12-18 NOTE — ED Notes (Addendum)
Pt up to b/r, urine sample obtained. Pt verbalizes "intent to leave as he is voluntary". EDP and [place] notified.

## 2022-12-18 NOTE — ED Provider Notes (Signed)
Per psych, patient is cleared from a psychiatric standpoint. Patient has no further complaints at this time and on my evaluation is A&Ox4, with capacity. He is discharged w/ psych f/u, w/ discharge instructions/return precautions. All questions answered to patient's satisfaction.     Audley Hose, MD 12/18/22 [phone]

## 2022-12-18 NOTE — BH Assessment (Signed)
Comprehensive Clinical Assessment (CCA) Note  12/18/2022 William Powers [id number]  Disposition: Per Evette Georges, NP patient recommended for overnight observation to be reassessed. Patient possibly able to follow-up with outpatient resources.  The patient demonstrates the following risk factors for suicide: Chronic risk factors for suicide include: substance use disorder. Acute risk factors for suicide include: social withdrawal/isolation. Protective factors for this patient include: hope for the future. Considering these factors, the overall suicide risk at this point appears to be high. Patient is not appropriate for outpatient follow up.  William Powers is a 42 year-old male who presents voluntarily to [facility] ED via EMS, after being [place] parking [place] intoxicated. Patient reports being suicidal with no plan. Patient states he wants to detox from alcohol. Patient denies HI, auditory or visual hallucinations. Patient denies access to firearms.   Patient reports he drank two pints of [employer]. Patient's blood alcohol Is 198 at 11:14p. He identifies finances as his primary stressor. Patient states he goes between his aunts home who lives in [place]. Patient has frequented the ED numerous times over the past couple of months.  Patient presents alert and oriented with normal speech. Patient Is dressed casually and there is no indication he is responding to internal stimuli. Patient is congruent in his thoughts.   Chief Complaint:  Chief Complaint  Patient presents with   Alcohol Intoxication   Visit Diagnosis:  Alcohol detox    CCA Screening, Triage and Referral (STR)  Patient Reported Information How did you hear about [place]? -- (EMS)  What Is the Reason for Your Visit/Call Today? Patient is a 42 y.o. single male who presents voluntarily to [facility] ED via EMS, after being found intoxicated, [place] to a church. Patient reports SI with no plan. Patient denies HI, auditory  or visual hallucinations. Patient denies access to weapons.  How Long Has This Been Causing You Problems? 1 wk - 1 month  What Do You Feel Would Help You the Most Today? Alcohol or Drug Use Treatment   Have You Recently Had Any Thoughts About Hurting Yourself? Yes  Are You Planning to Commit Suicide/Harm Yourself At This time? Yes   William Powers ED from 12/17/2022 in [facility] Emergency Department at [facility] Most recent reading at 12/18/2022  2:34 AM ED from 12/13/2022 in [facility] Emergency Department at [facility] Most recent reading at 12/14/2022  5:32 AM ED from 12/13/2022 in [facility] Most recent reading at 12/13/2022  4:13 AM  C-SSRS RISK CATEGORY High Risk Low Risk High Risk       Have you Recently Had Thoughts About [place]? No  Are You Planning to Harm Someone at This Time? No  Explanation: N/A   Have You Used Any Alcohol or Drugs in the Past 24 Hours? Yes  What Did You Use and How Much? Two pints of [place]   Do You Currently Have a Therapist/Psychiatrist? No  Name of Therapist/Psychiatrist: Name of Therapist/Psychiatrist: N/A   Have You Been Recently Discharged From Any Office Practice or Programs? No  Explanation of Discharge From Practice/Program: N/A     CCA Screening Triage Referral Assessment Type of Contact: Tele-Assessment  Telemedicine Service Delivery: Telemedicine service delivery: This service was provided via telemedicine using a 2-way, interactive audio and video technology  Is this Initial or Reassessment? Is this Initial or Reassessment?: Initial Assessment  Date Telepsych consult ordered in [facility]:  Date Telepsych consult  ordered in [facility]: 12/17/22  Time Telepsych consult ordered in [facility]:  Time Telepsych consult ordered in [facility]: [id number]  Location of Assessment: [facility] ED  Provider Location: [facility] Assessment Services   Collateral Involvement: None   Does Patient Have a [place]? No  Legal Guardian Contact Information: N/A  Copy of Legal Guardianship Form: -- (N/A)  Legal Guardian Notified of Arrival: -- (N/A)  Legal Guardian Notified of Pending Discharge: -- (N/A)  If Minor and Not Living with Parent(s), Who has Custody? N/A  Is CPS involved or ever been involved? Never  Is APS involved or ever been involved? Never   Patient Determined To Be At Risk for Harm To Self or Others Based on Review of Patient Reported Information or Presenting Complaint? Yes, for Self-Harm (Denies HI)  Method: No Plan  Availability of Means: No access or NA (Denies HI)  Intent: Vague intent or NA (Denies HI)  Notification Required: No need or identified person (Denies HI)  Additional Information for Danger to Others Potential: -- (N/A)  Additional Comments for Danger to Others Potential: N/A  Are There Guns or Other Weapons in Your Home? No  Types of Guns/Weapons: N/A  Are These Weapons Safely Secured?                            -- (N/A)  Who Could Verify You Are Able To Have These Secured: N/A  Do You Have any Outstanding Charges, Pending Court Dates, Parole/Probation? None  Contacted To Inform of Risk of Harm To Self or Others: -- (N/A)    Does Patient Present under Involuntary Commitment? No    [place] of Residence: Other (Comment) [facility])   Patient Currently Receiving the Following Services: CD--IOP (Intensive Chemical Dependency Program)   Determination of Need: Urgent (48 hours)   Options For Referral: Chemical Dependency Intensive Outpatient Therapy (CDIOP); Outpatient Therapy     CCA Biopsychosocial Patient Reported Schizophrenia/Schizoaffective Diagnosis in Past: No   Strengths: Patient seeking help   Mental Health Symptoms Depression:   None   Duration of Depressive symptoms:  Duration of Depressive Symptoms: Less than two weeks   Mania:   None   Anxiety:    None   Psychosis:   None    Duration of Psychotic symptoms:    Trauma:   None   Obsessions:   None   Compulsions:   None   Inattention:   None   Hyperactivity/Impulsivity:   None   Oppositional/Defiant Behaviors:   None   Emotional Irregularity:   None   Other Mood/Personality Symptoms:   N/A    Mental Status Exam Appearance and self-care  Stature:   Tall   Weight:   Average weight   Clothing:   Casual   Grooming:   Normal   Cosmetic use:   None   Posture/gait:   Normal   Motor activity:   Not Remarkable   Sensorium  Attention:   Normal   Concentration:   Normal   Orientation:   X5   Recall/memory:   Normal   Affect and Mood  Affect:   Flat   Mood:   Depressed   Relating  Eye contact:   Normal   Facial expression:   Constricted   Attitude toward examiner:   Cooperative   Thought and Language  Speech flow:  Normal   Thought content:   Appropriate to Mood and Circumstances   Preoccupation:  None   Hallucinations:   None   Organization:   Linear   Transport planner of Knowledge:   Average   Intelligence:   Average   Abstraction:   Normal   Judgement:   Dangerous   Reality Testing:   Adequate   Insight:   Poor   Decision Making:   Impulsive   Social Functioning  Social Maturity:   Isolates   Social Judgement:   Normal   Stress  Stressors:   Other (Comment) (None)   Coping Ability:   Overwhelmed   Skill Deficits:   None   Supports:   Support needed     Religion: Religion/Spirituality Are You A Religious Person?: No How Might This Affect Treatment?: N/A  Leisure/Recreation: Leisure / Recreation Do You Have Hobbies?: No  Exercise/Diet: Exercise/Diet Do You Exercise?: No Have You Gained or Lost A Significant Amount of Weight in the Past Six Months?: No Do You Follow a Special Diet?: No Do You Have Any Trouble Sleeping?: Yes Explanation of Sleeping Difficulties: Patient reports no sleeping  too well   CCA Employment/Education Employment/Work Situation: Employment / Work Situation Employment Situation: On disability Why is Patient on Disability: Heart failure How Long has Patient Been on Disability: since 20 Patient's Job has Been Impacted by Current Illness: No Has Patient ever Been in the [employer]?: No  Education: Education Is Patient Currently Attending School?: No Last Grade Completed: 10 Did You Attend College?: No Did You Have An Individualized Education Program (IIEP): No Did You Have Any Difficulty At School?: No Patient's Education Has Been Impacted by Current Illness: No   CCA Family/Childhood History Family and Relationship History: Family history Marital status: Single Does patient have children?: No  Childhood History:  Childhood History By whom was/is the patient raised?: Mother Did patient suffer any verbal/emotional/physical/sexual abuse as a child?: No Did patient suffer from severe childhood neglect?: No Has patient ever been sexually abused/assaulted/raped as an adolescent or adult?: No Witnessed domestic violence?: No Has patient been affected by domestic violence as an adult?: No       CCA Substance Use Alcohol/Drug Use: Alcohol / Drug Use Pain Medications: See MAR Prescriptions: See MAR Over the Counter: See MAR History of alcohol / drug use?: Yes Longest period of sobriety (when/how long): Unknown Negative Consequences of Use:  (N/A) Substance #1 Name of Substance 1: Alcohol 1 - Age of First Use: 14 1 - Amount (size/oz): two pints 1 - Frequency: daily 1 - Duration: Ongoing 1 - Last Use / Amount: Today 1 - Method of Aquiring: Purchase 1- Route of Use: Orally                       ASAM's:  Six Dimensions of Multidimensional Assessment  Dimension 1:  Acute Intoxication and/or Withdrawal Potential:      Dimension 2:  Biomedical Conditions and Complications:      Dimension 3:  Emotional, Behavioral, or  Cognitive Conditions and Complications:     Dimension 4:  Readiness to Change:     Dimension 5:  Relapse, Continued use, or Continued Problem Potential:     Dimension 6:  Recovery/Living Environment:     ASAM Severity Score:    ASAM Recommended Level of Treatment:     Substance use Disorder (SUD)    Recommendations for Services/Supports/Treatments:    Discharge Disposition:    DSM5 Diagnoses: Patient Active Problem List   Diagnosis Date Noted   Dental caries 09/13/2022  Substance abuse ([place]) 05/03/2022   Polysubstance abuse ([place]) 04/29/2022   Alcohol use disorder, severe, dependence ([place]) 03/16/2022   Recurrent major depressive episodes, moderate ([place]) 02/23/2022   Alcohol abuse    Vitamin B1 deficiency 09/19/2021   Adjustment disorder with depressed mood    History of CHF (congestive heart failure) 08/07/2021   H/O ETOH abuse 08/07/2021   Chronic HFrEF (heart failure with reduced ejection fraction) ([place]) 07/23/2021   HIV (human immunodeficiency virus infection) ([place]) 07/23/2021   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

## 2022-12-18 NOTE — ED Notes (Signed)
TTS at bedside on monitor.

## 2022-12-21 ENCOUNTER — Ambulatory Visit (HOSPITAL_COMMUNITY)
Admission: EM | Admit: 2022-12-21 | Discharge: 2022-12-22 | Disposition: A | Discharge status: Home / Self Care | Payer: No Typology Code available for payment source | Attending: Nurse Practitioner | Admitting: Nurse Practitioner

## 2022-12-21 DIAGNOSIS — F1090 Alcohol use, unspecified, uncomplicated: Secondary | ICD-10-CM

## 2022-12-21 DIAGNOSIS — Z1152 Encounter for screening for COVID-19: Secondary | ICD-10-CM | POA: Insufficient documentation

## 2022-12-21 DIAGNOSIS — F333 Major depressive disorder, recurrent, severe with psychotic symptoms: Secondary | ICD-10-CM | POA: Insufficient documentation

## 2022-12-21 DIAGNOSIS — F1911 Other psychoactive substance abuse, in remission: Secondary | ICD-10-CM | POA: Insufficient documentation

## 2022-12-22 DIAGNOSIS — F1090 Alcohol use, unspecified, uncomplicated: Secondary | ICD-10-CM | POA: Diagnosis not present

## 2022-12-22 DIAGNOSIS — F333 Major depressive disorder, recurrent, severe with psychotic symptoms: Secondary | ICD-10-CM | POA: Diagnosis not present

## 2022-12-22 DIAGNOSIS — F1911 Other psychoactive substance abuse, in remission: Secondary | ICD-10-CM | POA: Diagnosis not present

## 2022-12-22 DIAGNOSIS — Z1152 Encounter for screening for COVID-19: Secondary | ICD-10-CM | POA: Diagnosis not present

## 2022-12-22 LAB — COMPREHENSIVE METABOLIC PANEL
ALT: 23 U/L (ref 0–44)
AST: 30 U/L (ref 15–41)
Albumin: 3.6 g/dL (ref 3.5–5.0)
Alkaline Phosphatase: 55 U/L (ref 38–126)
Anion gap: 12 (ref 5–15)
BUN: 9 mg/dL (ref 6–20)
CO2: 23 mmol/L (ref 22–32)
Calcium: 8.9 mg/dL (ref 8.9–10.3)
Chloride: 102 mmol/L (ref 98–111)
Creatinine, Ser: 1 mg/dL (ref 0.61–1.24)
GFR, Estimated: >60 mL/min (ref >60)
Glucose, Bld: 92 mg/dL (ref 70–99)
Potassium: 3.5 mmol/L (ref 3.5–5.1)
Sodium: 137 mmol/L (ref 135–145)
Total Bilirubin: 1 mg/dL (ref 0.3–1.2)
Total Protein: 8.3 g/dL — ABNORMAL HIGH (ref 6.5–8.1)

## 2022-12-22 LAB — RESP PANEL BY RT-PCR (RSV, FLU A&B, COVID)  RVPGX2
Influenza A by PCR: NEGATIVE
Influenza B by PCR: NEGATIVE
Resp Syncytial Virus by PCR: NEGATIVE
SARS Coronavirus 2 by RT PCR: NEGATIVE

## 2022-12-22 LAB — CBC WITH DIFFERENTIAL/PLATELET
Abs Immature Granulocytes: 0.02 10*3/uL (ref 0.00–0.07)
Basophils Absolute: 0 10*3/uL (ref 0.0–0.1)
Basophils Relative: 1 %
Eosinophils Absolute: 0.1 10*3/uL (ref 0.0–0.5)
Eosinophils Relative: 2 %
HCT: 40.6 % (ref 39.0–52.0)
Hemoglobin: 14 g/dL (ref 13.0–17.0)
Immature Granulocytes: 1 %
Lymphocytes Relative: 41 %
Lymphs Abs: 1.5 10*3/uL (ref 0.7–4.0)
MCH: 31.5 pg (ref 26.0–34.0)
MCHC: 34.5 g/dL (ref 30.0–36.0)
MCV: 91.2 fL (ref 80.0–100.0)
Monocytes Absolute: 0.4 10*3/uL (ref 0.1–1.0)
Monocytes Relative: 10 %
Neutro Abs: 1.6 10*3/uL — ABNORMAL LOW (ref 1.7–7.7)
Neutrophils Relative %: 45 %
Platelets: 308 10*3/uL (ref 150–400)
RBC: 4.45 MIL/uL (ref 4.22–5.81)
RDW: 14.5 % (ref 11.5–15.5)
WBC: 3.6 10*3/uL — ABNORMAL LOW (ref 4.0–10.5)
nRBC: 0 % (ref 0.0–0.2)

## 2022-12-22 LAB — ETHANOL: Alcohol, Ethyl (B): 156 mg/dL — ABNORMAL HIGH (ref <10)

## 2022-12-22 LAB — POC SARS CORONAVIRUS 2 AG: SARSCOV2ONAVIRUS 2 AG: NEGATIVE

## 2022-12-22 MED ORDER — ACETAMINOPHEN 325 MG PO TABS: 650.0000 mg | ORAL_TABLET | Freq: Four times a day (QID) | ORAL | Status: DC | PRN

## 2022-12-22 MED ORDER — LORAZEPAM 1 MG PO TABS: 1.0000 mg | ORAL_TABLET | Freq: Four times a day (QID) | ORAL | Status: DC

## 2022-12-22 MED ORDER — LORAZEPAM 1 MG PO TABS: 1.0000 mg | ORAL_TABLET | Freq: Two times a day (BID) | ORAL | Status: DC

## 2022-12-22 MED ORDER — TRAZODONE HCL 50 MG PO TABS: 50.0000 mg | ORAL_TABLET | Freq: Every evening | ORAL | Status: DC | PRN

## 2022-12-22 MED ORDER — LORAZEPAM 1 MG PO TABS: 1.0000 mg | ORAL_TABLET | Freq: Every day | ORAL | Status: DC

## 2022-12-22 MED ORDER — LORAZEPAM 1 MG PO TABS: 1.0000 mg | ORAL_TABLET | Freq: Four times a day (QID) | ORAL | Status: DC | PRN

## 2022-12-22 MED ORDER — ALUM & MAG HYDROXIDE-SIMETH 200-200-20 MG/5ML PO SUSP: 30.0000 mL | ORAL | Status: DC | PRN

## 2022-12-22 MED ORDER — HYDROXYZINE HCL 25 MG PO TABS: 25.0000 mg | ORAL_TABLET | Freq: Four times a day (QID) | ORAL | Status: DC | PRN

## 2022-12-22 MED ORDER — ADULT MULTIVITAMIN W/MINERALS CH: 1.0000 | ORAL_TABLET | Freq: Every day | ORAL | Status: DC

## 2022-12-22 MED ORDER — MAGNESIUM HYDROXIDE 400 MG/5ML PO SUSP: 30.0000 mL | Freq: Every day | ORAL | Status: DC | PRN

## 2022-12-22 MED ORDER — LORAZEPAM 1 MG PO TABS: 1.0000 mg | ORAL_TABLET | Freq: Three times a day (TID) | ORAL | Status: DC

## 2022-12-22 MED ORDER — HYDROXYZINE HCL 25 MG PO TABS: 25.0000 mg | ORAL_TABLET | Freq: Three times a day (TID) | ORAL | Status: DC | PRN

## 2022-12-22 MED ORDER — ONDANSETRON 4 MG PO TBDP: 4.0000 mg | ORAL_TABLET | Freq: Four times a day (QID) | ORAL | Status: DC | PRN

## 2022-12-22 MED ORDER — LOPERAMIDE HCL 2 MG PO CAPS: 2.0000 mg | ORAL_CAPSULE | ORAL | Status: DC | PRN

## 2022-12-22 MED ORDER — THIAMINE MONONITRATE 100 MG PO TABS: 100.0000 mg | ORAL_TABLET | Freq: Every day | ORAL | Status: DC

## 2022-12-22 NOTE — ED Provider Notes (Signed)
FBC/OBS ASAP Discharge Summary  Date and Time: 12/22/2022 8:33 AM  Name: William Powers  MRN:  [id number]   Discharge Diagnoses:  Final diagnoses:  Alcohol use disorder  MDD (major depressive disorder), recurrent, severe, with psychosis ([place])    Subjective: patient presented to [facility] as a walk in with complaints of SI and HI. He was admitted to the continuous assessment unit for overnight observation.   William Powers, 42 y.o., male patient seen face to face by this provider chart reviewed on 12/22/22.  Patient has a past psychiatric history of polysubstance abuse, adjustment disorder with depressed mood, alcohol use disorder and suicidal ideation. Patient has had 14 ED visits in the last 6 months.    Upon admission ETOH was 156. UDS was not collected.   During evaluation William Powers is laying in his bed asleep. He is easily awakened.  He is alert, oriented x 4, calm, cooperative and attentive. He has normal speech, and behavior. He is denying SI/HI at this time, states, "I was drunk". He is requesting to be discharged at this time. He verbally contracts for safety. He denies access to firearms/weapons. Objectively there is no evidence of psychosis/mania or delusional thinking.  Patient is able to converse coherently, goal directed thoughts, no distractibility, or pre-occupation.  Patient answered question appropriately.    Stay Summary: Patient was admitted to the continuous assessment unit.  While on the unit he remained calm and cooperative.  He required no as needed medications for agitation.  He was appropriate with staff and other patients.  His improvement was monitored by observation and William Powers report of symptom reduction; PATIENT STATEMENT OF DOING BETTER.  Emotional and mental status was also monitored by staff.          He was offered further treatment options upon discharge including but not limited to Residential, Intensive Outpatient, Outpatient  treatment, and resources for shelters if needed.  He did express interest in the CD IOP program and referral was made. [name] will follow up with the services as listed below under Follow up Information.     Upon completion of this admission the [place] was both mentally and medically stable for discharge denying suicidal/homicidal ideation, auditory/visual/tactile hallucinations, delusional thoughts and paranoia.      Total Time spent with patient: 30 minutes  Past Psychiatric History: as documented in H&P Past Medical History: as documented in H&P Family History: as documented in H&P Family Psychiatric History: as documented in H&P Social History: as documented in H&P Tobacco Cessation:  A prescription for an FDA-approved tobacco cessation medication was offered at discharge and the patient refused  Current Medications:  Current Facility-Administered Medications  Medication Dose Route Frequency Provider Last Rate Last Admin   acetaminophen (TYLENOL) tablet 650 mg  650 mg Oral Q6H PRN Bobbitt, Shalon E, NP       alum & mag hydroxide-simeth (MAALOX/MYLANTA) 200-200-20 MG/5ML suspension 30 mL  30 mL Oral Q4H PRN Bobbitt, Shalon E, NP       hydrOXYzine (ATARAX) tablet 25 mg  25 mg Oral Q6H PRN Bobbitt, Shalon E, NP       loperamide (IMODIUM) capsule 2-4 mg  2-4 mg Oral PRN Bobbitt, Shalon E, NP       LORazepam (ATIVAN) tablet 1 mg  1 mg Oral Q6H PRN Bobbitt, Shalon E, NP       LORazepam (ATIVAN) tablet 1 mg  1 mg Oral QID Bobbitt, Lennie Muckle, NP  Followed by   Derrill Memo ON 12/23/2022] LORazepam (ATIVAN) tablet 1 mg  1 mg Oral TID Bobbitt, Shalon E, NP       Followed by   Derrill Memo ON 12/24/2022] LORazepam (ATIVAN) tablet 1 mg  1 mg Oral BID Bobbitt, Shalon E, NP       Followed by   Derrill Memo ON 12/26/2022] LORazepam (ATIVAN) tablet 1 mg  1 mg Oral Daily Bobbitt, Shalon E, NP       magnesium hydroxide (MILK OF MAGNESIA) suspension 30 mL  30 mL Oral Daily PRN Bobbitt, Shalon E, NP        multivitamin with minerals tablet 1 tablet  1 tablet Oral Daily Bobbitt, Shalon E, NP       ondansetron (ZOFRAN-ODT) disintegrating tablet 4 mg  4 mg Oral Q6H PRN Bobbitt, Shalon E, NP       [START ON 12/23/2022] thiamine (VITAMIN B1) tablet 100 mg  100 mg Oral Daily Bobbitt, Shalon E, NP       traZODone (DESYREL) tablet 50 mg  50 mg Oral QHS PRN Bobbitt, Shalon E, NP       No current outpatient medications on file.    PTA Medications:  Facility Ordered Medications  Medication   acetaminophen (TYLENOL) tablet 650 mg   alum & mag hydroxide-simeth (MAALOX/MYLANTA) 200-200-20 MG/5ML suspension 30 mL   magnesium hydroxide (MILK OF MAGNESIA) suspension 30 mL   traZODone (DESYREL) tablet 50 mg   [START ON 12/23/2022] thiamine (VITAMIN B1) tablet 100 mg   multivitamin with minerals tablet 1 tablet   LORazepam (ATIVAN) tablet 1 mg   hydrOXYzine (ATARAX) tablet 25 mg   loperamide (IMODIUM) capsule 2-4 mg   ondansetron (ZOFRAN-ODT) disintegrating tablet 4 mg   LORazepam (ATIVAN) tablet 1 mg   Followed by   Derrill Memo ON 12/23/2022] LORazepam (ATIVAN) tablet 1 mg   Followed by   Derrill Memo ON 12/24/2022] LORazepam (ATIVAN) tablet 1 mg   Followed by   Derrill Memo ON 12/26/2022] LORazepam (ATIVAN) tablet 1 mg       05/03/2022   12:30 PM 08/11/2021    8:12 AM  Depression screen PHQ 2/9  Decreased Interest 1 0  Down, Depressed, Hopeless 2 0  PHQ - 2 Score 3 0  Altered sleeping 2   Tired, decreased energy 1   Change in appetite 0   Feeling bad or failure about yourself  2   Trouble concentrating 0   Moving slowly or fidgety/restless 0   Suicidal thoughts 2   PHQ-9 Score 10   Difficult doing work/chores Somewhat difficult     Flowsheet Row ED from [facility]21/2022 in [facility] ED from 12/17/2022 in [facility] Emergency Department at [facility] ED from 12/13/2022 in [facility] Emergency Department at [place] Error: Q3, 4, or 5 should  not be populated when Q2 is No Moderate Risk Low Risk       Musculoskeletal  Strength & Muscle Tone: within normal limits Gait & Station: normal Patient leans: N/A  Psychiatric Specialty Exam  Presentation  General Appearance:  Casual  Eye Contact: Good  Speech: Clear and Coherent; Normal Rate  Speech Volume: Normal  Handedness: Right   Mood and Affect  Mood: Dysphoric  Affect: Appropriate   Thought Process  Thought Processes: Coherent  Descriptions of Associations:Intact  Orientation:Full (Time, Place and Person)  Thought Content:Logical  Diagnosis of Schizophrenia or Schizoaffective disorder in past: No  Duration of Psychotic Symptoms: Greater than  six months   Hallucinations:Hallucinations: None Description of Visual Hallucinations: I see shadow sometimes  Ideas of Reference:None  Suicidal Thoughts:Suicidal Thoughts: No SI Active Intent and/or Plan: With Intent; With Plan SI Passive Intent and/or Plan: With Intent; With Plan  Homicidal Thoughts:Homicidal Thoughts: No HI Active Intent and/or Plan: Without Intent; Without Plan HI Passive Intent and/or Plan: Without Intent; Without Plan   Sensorium  Memory: Immediate Good; Recent Good; Remote Good  Judgment: Fair  Insight: Fair   Executive Functions  Concentration: Good  Attention Span: Good  Recall: Good  Fund of Knowledge: Good  Language: Good   Psychomotor Activity  Psychomotor Activity: Psychomotor Activity: Normal   Assets  Assets: Communication Skills; Desire for Improvement; Physical Health; Resilience; Social Support; Leisure Time; Financial Resources/Insurance   Sleep  Sleep: Sleep: Good Number of Hours of Sleep: 4   Nutritional Assessment (For OBS and FBC admissions only) Has the patient had a weight loss or gain of 10 pounds or more in the last 3 months?: No Has the patient had a decrease in food intake/or appetite?: No Does the patient have dental  problems?: No Does the patient have eating habits or behaviors that may be indicators of an eating disorder including binging or inducing vomiting?: No Has the patient recently lost weight without trying?: 0 Has the patient been eating poorly because of a decreased appetite?: 0 Malnutrition Screening Tool Score: 0    Physical Exam  Physical Exam Vitals and nursing note reviewed.  Constitutional:      General: He is not in acute distress.    Appearance: Normal appearance. He is well-developed.  HENT:     Head: Normocephalic.  Eyes:     General:        Right eye: No discharge.        Left eye: No discharge.  Cardiovascular:     Rate and Rhythm: Normal rate.  Pulmonary:     Effort: Pulmonary effort is normal. No respiratory distress.  Musculoskeletal:        General: Normal range of motion.     Cervical back: Normal range of motion.  Skin:    Coloration: Skin is not jaundiced or pale.  Neurological:     Mental Status: He is alert and oriented to person, place, and time.  Psychiatric:        Attention and Perception: Attention and perception normal.        Mood and Affect: Mood and affect normal.        Speech: Speech normal.        Behavior: Behavior normal. Behavior is cooperative.        Thought Content: Thought content normal.        Cognition and Memory: Cognition normal.        Judgment: Judgment normal.    Review of Systems  Constitutional: Negative.   HENT: Negative.    Eyes: Negative.   Respiratory: Negative.    Cardiovascular: Negative.   Musculoskeletal: Negative.   Skin: Negative.   Neurological: Negative.   Psychiatric/Behavioral:  Positive for substance abuse.    Blood pressure 119/71, pulse 92, temperature 98.1 F (36.7 C), temperature source Oral, resp. rate 16, SpO2 99 %. There is no height or weight on file to calculate BMI.  Demographic Factors:  Low socioeconomic status, Living alone, and Unemployed  Loss Factors: Financial problems/change in  socioeconomic status  Historical Factors: Family history of mental illness or substance abuse  Risk Reduction Factors:   Sense of  responsibility to family, Positive therapeutic relationship, and Positive coping skills or problem solving skills  Continued Clinical Symptoms:  Severe Anxiety and/or Agitation Depression:   Comorbid alcohol abuse/dependence Impulsivity Alcohol/Substance Abuse/Dependencies Previous Psychiatric Diagnoses and Treatments  Cognitive Features That Contribute To Risk:  None    Suicide Risk:  Minimal: No identifiable suicidal ideation.  Patients presenting with no risk factors but with morbid ruminations; may be classified as minimal risk based on the severity of the depressive symptoms  Plan Of Care/Follow-up recommendations:  Activity:  as tolerated  Diet:  Regular   Disposition:   Discharge patient   Provided outpatient psychiatric resources for medication management, therapy, and substance abuse treatment.  Referral made to CD Marquez, NP 12/22/2022, 8:33 AM

## 2022-12-22 NOTE — ED Notes (Signed)
Pt has UDS cup at bedside.  Pt aware that a sample is needed.

## 2022-12-22 NOTE — Discharge Instructions (Signed)
CHARITABLE RESIDENTIAL REHABS  [facility] (women only at this campus) [place], [place] [address] [phone]  [facility] [place] (men only at this campus) [place], [place] [address] [phone]  ((These programs listed above have a one-time application fee.))   [facility]. [place], [place], [address] Men's Program - Roderic Ovens [place] - [id number] Women's Program - Delma Freeze [phone] Intake Coordinator - [phone]  (73-monthprogram offering Hep C treatment to anyone who reaches the 965-monthark in their program.  Free of cost.  No controlled substances allowed for medications.  No heavy anti-psychotics like the ones that treat schizophrenia or Seroquel.  Transportation for pick-up is available.)   DeChesapeake Energy[address] [phone](DeBlisss not for everyone.  They have stringent acceptance criteria.  No pending legal issues, no psychotropic medications, and zero history of SI/HI/AVH. This is a free program, but it is a high end program.)  DuDanburya[address] [phone] phone  (This is a halfway house for WODallas18+.  Residential is 12 months, and non-residential is 6-12 months depending on progress.)  MaBurman Nievesnd ErCendant Corporation[address][phone] phone [phone]  (WLouisville Endoscopy Centeralfway house, 9-12 months)  MaOlivetrWoodbineNC [phone] [phone] General Health System South CampusRehab for men only) [address]tHarrisonNC [phone] REDestrehan HoFish Pond Surgery CenteriMcGrawNCAlaska([phone]'s [phone] DETOX/RESIDENTIAL TREATMENT- MEDICAID/IPRS:  ARCA [id number] WiHaysvilleNC [id number] Walla[address]vHamiltonNC [phone] PointsAsSilvertonNC [phone] RoadtRobbinsNC [phone] [phone]((For admissions to these three DaWest Monroe Endoscopy Asc LLCacilities during weekday days and possibly other times, contact ChTillarphone: [phone]: [phone]  DICollege Park Surgery Center LLCRINess2[address][phone] phone  InYates Cityf Greater [place] [address]Kiowaa[address]iWest OrangeNCAlaska2873710Gastonia, [phone] phone [phone]  (Ae[address]BCMalcomCiAldieMost Medicare and Medicaid, UHMillersvilleUninsured/IPRS)  Residential Treatment Services (detox for men and women) 13MurrietaNC [phone] DETOX AND RESIDENTIAL TREATMENT - INSURANCE:  Fellowship HaNevada Cranealso offers CD-IOP for graduates of residential program) 51Eden PrairieGrSuperiorNC [phone] Center of GaMinornow accepting ViAlaska11LumbertonVA [phone] Heights Medical Centeraccept FeCitrus Endoscopy Centeror some services) [address]WiHyndmanNC [phone] PROGRAMS:  Alcohol and Drug Services (ADS) 11BrowndellNC [phone]((CD-IOP is currently not operational; Opioid replacement clinic is operational))   CoSan Fernando Clinict GrWoodland Surgery Center LLCprivate insurance) 51WyomingElBlack & DeckerSte [address]344 Hill StreetNC [phone]((CD-IOP  (afternoon program); individual therapy))   GuCataract Laser Centercentral LLC  Health ([place] for [facility] residents) [place], [place] [address] [phone]  ((CD-IOP; new clients must go through walk-in clinic))   Ware (Medicaid & IPRS for [facility] residents) [place]. [place], [place] [address] [id number]  ((CD-IOP))   [facility] [place] (Medicaid for [employer] and [employer]; some private insurance) [address], [place] [address] [phone]  ((CD-IOP))   [facility] ([place] insurance) [phone] [address]. [place], [place] [address] [phone]  ((CD-IOP (morning & evening programs); individual therapy))   HALFWAY HOUSES:  Friends of Bill [phone]  [email or web address]   OPIOID REPLACEMENT PROVIDERS:  Alcohol and Drug Services (ADS) [place], [place] [address] [phone]  [place] [address] [place] [place], [place] [address] [phone]  [facility] [address] [place], [place] [address] [phone]   12 STEP PROGRAMS:  Alcoholics Anonymous of [place] [email or web address]  Narcotics Anonymous of [place] [email or web address]  Al-Anon of [place], [place] [email or web address]  Nar-Anon [email or web address]-[email or web address]-[email or web address]

## 2022-12-22 NOTE — ED Notes (Signed)
STAT lab courier called to transport labs to [facility] lab.

## 2022-12-22 NOTE — Progress Notes (Signed)
   12/22/22 0028  [place] Triage Screening (Walk-ins at [facility] only)  How Did You Hear About [place]? Self  What Is the Reason for Your Visit/Call Today? William Powers is a 42 year old male presenting as a voluntary walk-in to [facility] due to [place] with attempt of drinking bleach. Patient states he has homicidal thoughts with no identified person "I want to fuck people up". Patient stated he was at a business when he started having mental health issues, "I was throwing rocks, there was a bottle of bleach on the counter, I tried to grab it and drink it and they stopped me". Patient reported business called EMS. Patient reported this was a suicide attempt. Patient reported having racing thoughts and reports drinking "1 pint of fireball" tonight. Patient was cooperative and unable to contract for safety.  How Long Has This Been Causing You Problems? > than 6 months  Have You Recently Had Any Thoughts About Hurting Yourself? Yes  How long ago did you have thoughts about hurting yourself? now  Are You Planning to [place] At This time? Yes  Have you Recently Had Thoughts About Hurting Someone Guadalupe Dawn? Yes  How long ago did you have thoughts of harming others? prior to arrival, no specific person  Are You Planning To Harm Someone At This Time? No  Explanation: n/a  Are you currently experiencing any auditory, visual or other hallucinations? Yes  Please explain the hallucinations you are currently experiencing: "I see shadows or a flash sometimes"  Have You Used Any Alcohol or Drugs in the Past 24 Hours? Yes  How long ago did you use Drugs or Alcohol? alcohol  What Did You Use and How Much? "1 pint fireball today"  Do you have any current medical co-morbidities that require immediate attention? No  Clinician description of patient physical appearance/behavior: neat / cooperative  What Do You Feel Would Help You the Most Today? Alcohol or Drug Use Treatment;Treatment for Depression or other mood problem   If access to [facility] Urgent Care was not available, would you have sought care in the Emergency Department? Yes  Determination of Need Urgent (48 hours)  Options For Referral Outpatient Therapy;Medication Management;BH Urgent Care;Other: Comment (Continuous observation)    Flowsheet Row ED from 12/21/2022 in [facility] ED from [facility]20[facility] in [facility] from 12/13/2022 in [facility] Emergency Department at [place] High Risk Moderate Risk Low Risk

## 2022-12-22 NOTE — BH Assessment (Addendum)
Comprehensive Clinical Assessment (CCA) Note  12/22/2022 William Powers [id number]  Disposition: Leandro Reasoner, NP, recommends continuous observation for safety.   The patient demonstrates the following risk factors for suicide: Chronic risk factors for suicide include: psychiatric disorder of Depression dos, substance use disorder, previous suicide attempts denied, previous self-harm of cutting his arms, and chronic pain. Acute risk factors for suicide include: family or marital conflict, loss (financial, interpersonal, professional), and recent discharge from inpatient psychiatry. Protective factors for this patient include: coping skills and hope for the future. Considering these factors, the overall suicide risk at this point appears to be moderate. Patient is not appropriate for outpatient follow up.  William Powers is a 42 year old male presenting as a voluntary walk-in to [facility] due to William Powers with attempt of drinking bleach. Patient states he has homicidal thoughts with no identified person "I want to fuck people up". Patient denied HI and drug usage. Patient stated he was at a business when he started having mental health issues, "I was throwing rocks, there was a bottle of bleach on the counter, I tried to grab it and drink it and then one of the employees stopped me". Patient reported business called EMS. Patient reported this was a suicide attempt. Patient reported having racing thoughts and reports drinking "1 pint of fireball" tonight. Patient was cooperative and unable to contract for safety. Patient reported main stressors/triggers include finances and women. Patient reported worsening depressive symptoms. Patient denied prior psych hospitalizations and no history of additional suicide attempts per patient.   Patient denied receiving outpatient therapy resources. Patient denied being prescribed any psych medications.    Chief Complaint:  Chief Complaint  Patient presents with   Suicidal    Alcohol Problem   Visit Diagnosis: Depression   CCA Screening, Triage and Referral (STR)  Patient Reported Information How did you hear about [place]? Self  What Is the Reason for Your Visit/Call Today? William Powers is a 42 year old male presenting as a voluntary walk-in to [facility] due to [place] with attempt of drinking bleach. Patient states he has homicidal thoughts with no identified person "I want to fuck people up". Patient stated he was at a business when he started having mental health issues, "I was throwing rocks, there was a bottle of bleach on the counter, I tried to grab it and drink it and they stopped me". Patient reported business called EMS. Patient reported this was a suicide attempt. Patient reported having racing thoughts and reports drinking "1 pint of fireball" tonight. Patient was cooperative and unable to contract for safety.  How Long Has This Been Causing You Problems? > than 6 months  What Do You Feel Would Help You the Most Today? Alcohol or Drug Use Treatment; Treatment for Depression or other mood problem   Have You Recently Had Any Thoughts About Hurting Yourself? Yes  Are You Planning to Commit Suicide/Harm Yourself At This time? Yes   [place] ED from [facility] ED from [facility]17[facility]20[facility] ED from 12/13/2022 in [facility] Emergency Department at [place] High Risk Moderate Risk Low Risk       Have you Recently Had Thoughts About [place]? Yes  Are You Planning to Harm Someone at This Time? No  Explanation: n/a   Have You Used Any Alcohol or Drugs in the Past 24 Hours? Yes  What  Did You Use and How Much? "1 pint fireball today"   Do You Currently Have a Therapist/Psychiatrist? No  Name of Therapist/Psychiatrist: Name of Therapist/Psychiatrist: n/a   Have You Been Recently Discharged From Any Office Practice or  Programs? No  Explanation of Discharge From Practice/Program: n/a     CCA Screening Triage Referral Assessment Type of Contact: Face-to-Face  Telemedicine Service Delivery: Telemedicine service delivery: -- (n/a)  Is this Initial or Reassessment? Is this Initial or Reassessment?: Initial Assessment  Date Telepsych consult ordered in CHL:  Date Telepsych consult ordered in CHL:  (n/a)  Time Telepsych consult ordered in CHL:  Time Telepsych consult ordered in CHL: 0000 (n/a)  Location of Assessment: [facility] Services  Provider Location: [facility] Assessment Services   Collateral Involvement: none reported   Does Patient Have a Bryson? No  Legal Guardian Contact Information: n/a  Copy of Legal Guardianship Form: -- (n/a)  Legal Guardian Notified of Arrival: -- (n/a)  Legal Guardian Notified of Pending Discharge: -- (n/a)  If Minor and Not Living with Parent(s), Who has Custody? n/a  Is CPS involved or ever been involved? Never  Is APS involved or ever been involved? Never   Patient Determined To Be At Risk for Harm To Self or Others Based on Review of Patient Reported Information or Presenting Complaint? Yes, for Self-Harm  Method: Plan with intent and identified person  Availability of Means: In hand or used  Intent: Intends to cause physical harm but not necessarily death  Notification Required: No need or identified person  Additional Information for Danger to Others Potential: -- (denied)  Additional Comments for Danger to Others Potential: denied  Are There Guns or Other Weapons in Your Home? No  Types of Guns/Weapons: n/a  Are These Weapons Safely Secured?                            -- (n/a)  Who Could Verify You Are Able To Have These Secured: n/a  Do You Have any Outstanding Charges, Pending Court Dates, Parole/Probation? none reported  Contacted To Inform of Risk of Harm To Self or Others: Law Enforcement ([employer]  br[facility] to [facility])    Does Patient Present under Involuntary Commitment? No    [place] of Residence: William Powers   Patient Currently Receiving the Following Services: Not Receiving Services   Determination of Need: Urgent (48 hours)   Options For Referral: Outpatient Therapy; Medication Management; [facility] Urgent Care; Other: Comment (Continuous observation)     CCA Biopsychosocial Patient Reported Schizophrenia/Schizoaffective Diagnosis in Past: No   Strengths: Patient seeking help   Mental Health Symptoms Depression:   Irritability; Hopelessness; Fatigue; Worthlessness; Change in energy/activity; Difficulty Concentrating; Tearfulness; Sleep (too much or little)   Duration of Depressive symptoms:  Duration of Depressive Symptoms: Greater than two weeks   Mania:   Racing thoughts   Anxiety:    Tension; Restlessness; Irritability   Psychosis:   Hallucinations   Duration of Psychotic symptoms:  Duration of Psychotic Symptoms: Greater than six months   Trauma:   None   Obsessions:   None   Compulsions:   None   Inattention:   None   Hyperactivity/Impulsivity:   None   Oppositional/Defiant Behaviors:   None   Emotional Irregularity:   None   Other Mood/Personality Symptoms:   N/A    Mental Status Exam Appearance and self-care  Stature:   Tall  Weight:   Average weight   Clothing:   Casual   Grooming:   Normal   Cosmetic use:   None   Posture/gait:   Normal   Motor activity:   Not Remarkable   Sensorium  Attention:   Normal   Concentration:   Normal   Orientation:   X5   Recall/memory:   Normal   Affect and Mood  Affect:   Appropriate   Mood:   Anxious   Relating  Eye contact:   Normal   Facial expression:   Responsive   Attitude toward examiner:   Cooperative   Thought and Language  Speech flow:  Normal   Thought content:   Appropriate to Mood and Circumstances   Preoccupation:   None    Hallucinations:   Visual (shadows)   Organization:   Linear   Transport planner of Knowledge:   Average   Intelligence:   Average   Abstraction:   Normal   Judgement:   Dangerous   Reality Testing:   Adequate   Insight:   Poor   Decision Making:   Impulsive   Social Functioning  Social Maturity:   Isolates   Social Judgement:   Normal   Stress  Stressors:   Museum/gallery curator; Relationship   Coping Ability:   Programme researcher, broadcasting/film/video Deficits:   Environmental health practitioner; Self-control   Supports:   Support needed     Religion: Religion/Spirituality Are You A Religious Person?: No How Might This Affect Treatment?: N/A  Leisure/Recreation: Leisure / Recreation Do You Have Hobbies?: No  Exercise/Diet: Exercise/Diet Do You Exercise?: No Have You Gained or Lost A Significant Amount of Weight in the Past Six Months?: No Do You Follow a Special Diet?: No Do You Have Any Trouble Sleeping?: Yes Explanation of Sleeping Difficulties: 4 hours nightly   CCA Employment/Education Employment/Work Situation: Employment / Work Situation Employment Situation: On disability Why is Patient on Disability: Heart failure How Long has Patient Been on Disability: since 58 Patient's Job has Been Impacted by Current Illness: No Has Patient ever Been in the [employer]?: No  Education: Education Is Patient Currently Attending School?: No Last Grade Completed: 10 Did You Attend College?: No Did You Have An Individualized Education Program (IIEP): No Did You Have Any Difficulty At School?: No Patient's Education Has Been Impacted by Current Illness:  (patient is unsure)   CCA Family/Childhood History Family and Relationship History: Family history Marital status: Divorced Divorced, when?: married for 3 months and then divorced What types of issues is patient dealing with in the relationship?: unsure Additional relationship information: n/a Does patient have children?:  No  Childhood History:  Childhood History By whom was/is the patient raised?: Mother Did patient suffer any verbal/emotional/physical/sexual abuse as a child?: No Did patient suffer from severe childhood neglect?: No Has patient ever been sexually abused/assaulted/raped as an adolescent or adult?: No Was the patient ever a victim of a crime or a disaster?: No Witnessed domestic violence?: No Has patient been affected by domestic violence as an adult?: No       CCA Substance Use Alcohol/Drug Use: Alcohol / Drug Use Pain Medications: See MAR Prescriptions: See MAR Over the Counter: See MAR History of alcohol / drug use?: Yes Longest period of sobriety (when/how long): Unknown Negative Consequences of Use:  (N/A) Withdrawal Symptoms: None Substance #1 Name of Substance 1: alcohol 1 - Age of First Use: teenager 1 - Amount (size/oz): pint 1 - Frequency: daily 1 -  Duration: unable to recall 1 - Last Use / Amount: tonigh 1 - Method of Aquiring: purchase 1- Route of Use: oral                       ASAM's:  Six Dimensions of Multidimensional Assessment  Dimension 1:  Acute Intoxication and/or Withdrawal Potential:   Dimension 1:  Description of individual's past and current experiences of substance use and withdrawal: Pt denies, experiencing withdrawal symptoms.  Dimension 2:  Biomedical Conditions and Complications:   Dimension 2:  Description of patient's biomedical conditions and  complications: Pt has heart failure, he's not linked to a Film/video editor.  Dimension 3:  Emotional, Behavioral, or Cognitive Conditions and Complications:  Dimension 3:  Description of emotional, behavioral, or cognitive conditions and complications: Pt is diagnosed with Alcohol use disorder, severe, dependence ([place]), Recurrent major depressive episodes, moderate ([place]).  Dimension 4:  Readiness to Change:  Dimension 4:  Description of Readiness to Change criteria: Pt reports, has tried getting  help in the past and not successful.  Dimension 5:  Relapse, Continued use, or Continued Problem Potential:  Dimension 5:  Relapse, continued use, or continued problem potential critiera description: Pt reports, he uses everyday.  Dimension 6:  Recovery/Living Environment:  Dimension 6:  Recovery/Iiving environment criteria description: Pt is homeless and is back and forth from [place] to [place]. Pt denies, having supports.  ASAM Severity Score: ASAM's Severity Rating Score: 12  ASAM Recommended Level of Treatment: ASAM Recommended Level of Treatment: Level II Intensive Outpatient Treatment   Substance use Disorder (SUD) Substance Use Disorder (SUD)  Checklist Symptoms of Substance Use: Continued use despite having a persistent/recurrent physical/psychological problem caused/exacerbated by use, Continued use despite persistent or recurrent social, interpersonal problems, caused or exacerbated by use, Evidence of tolerance  Recommendations for Services/Supports/Treatments: Recommendations for Services/Supports/Treatments Recommendations For Services/Supports/Treatments: Residential-Level 1, Peer Support Services, Partial Hospitalization, CST Marine scientist), Other (Comment) (Rehab program.)  Discharge Disposition: Discharge Disposition Medical Exam completed: Yes  DSM5 Diagnoses: Patient Active Problem List   Diagnosis Date Noted   Dental caries 09/13/2022   Substance abuse ([place]) 05/03/2022   Polysubstance abuse ([place]) 04/29/2022   Alcohol use disorder, severe, dependence ([place]) 03/16/2022   Recurrent major depressive episodes, moderate ([place]) 02/23/2022   Alcohol abuse    Suicidal ideation    Vitamin B1 deficiency 09/19/2021   Adjustment disorder with depressed mood    History of CHF (congestive heart failure) 08/07/2021   H/O ETOH abuse 08/07/2021   Chronic HFrEF (heart failure with reduced ejection fraction) ([place]) 07/23/2021   HIV (human immunodeficiency virus infection)  ([place]) 07/23/2021   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

## 2022-12-22 NOTE — ED Notes (Signed)
Pt resting quielty on unit.  He has been oriented to unit and provided meal.  Currently denies SI, HI, and AVH.  Will continue to monitor for safety.

## 2022-12-22 NOTE — ED Provider Notes (Signed)
[facility] Continuous Assessment Admission H&P  Date: 12/22/22 Patient Name: William Powers MRN: [id number] Chief Complaint: I am having bad mental issues  Diagnoses:  Final diagnoses:  Alcohol use disorder  MDD (major depressive disorder), recurrent, severe, with psychosis ([place])    HPI: William Powers is a 42 y/o male with a history of polysubstance abuse, adjustment disorder with depressed mood, alcohol use disorder and suicidal ideation presenting to [facility] via [facility] for suicidal ideations.  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  Patient reports that he has been having suicidal ideations and homicidal ideations and that he would rather hurt himself and than hurt anyone else.  Patient was evaluated face to face and chart reviewed by this nurse practitioner.  Patient has had 14 ED visits in the last 6 months. Patient reports that he has been feeling depressed and stressed about his finances, women and he has been staying in a motel.  Patient endorses feeling worried, nervous, having racing thoughts, angry, irritability, mood swings and poor sleep.. Patient endorses suicidal ideations and experiencing visual hallucinations of shadows.   Patient states that he has been drinking a lot of alcohol over the last few days and that today he drank 1 pint of fireball whiskey.  Patient denies using any other illicit substances patient states that he wants to get help with his drinking and for his mental health symptoms.  Patient denies currently being on any medications for mental health or physical health. Patient denies currently having any outpatient mental health providers. Per Chart Review patient has heart failure with ejection fraction of 15% and is on disability for this condition and is HIV+ but is not taking any medication at this time.    Patient is alert oriented x 4, calm and cooperative, speech is clear and coherent, mood is depressed with congruent affect.  Thought process is logical, linear and goal directed.  Patient does not appear to be responding to any internal or external stimuli at this time.  Patient will be admitted to [facility] continuous observation for crisis management, safety and stabilization.   Total Time spent with patient: 30 minutes  Musculoskeletal  Strength & Muscle Tone: within normal limits Gait & Station: normal Patient leans: N/A  Psychiatric Specialty Exam  Presentation General Appearance:  Casual  Eye Contact: Good  Speech: Clear and Coherent  Speech Volume: Normal  Handedness: Right   Mood and Affect  Mood: Depressed  Affect: Appropriate   Thought Process  Thought Processes: Coherent  Descriptions of Associations:Intact  Orientation:Full (Time, Place and Person)  Thought Content:WDL  Diagnosis of Schizophrenia or Schizoaffective disorder in past: No   Hallucinations:Hallucinations: Visual Description of Visual Hallucinations: I see shadow sometimes  Ideas of Reference:None  Suicidal Thoughts:Suicidal Thoughts: Yes, Passive SI Active Intent and/or Plan: With Intent; With Plan SI Passive Intent and/or Plan: With Intent; With Plan  Homicidal Thoughts:Homicidal Thoughts: Yes, Passive HI Active Intent and/or Plan: Without Intent; Without Plan HI Passive Intent and/or Plan: Without Intent; Without Plan   Sensorium  Memory: Immediate Fair; Recent Fair; Remote Fair  Judgment: Poor  Insight: Poor   Executive Functions  Concentration: Fair  Attention Span: Fair  Recall: [place] of Knowledge: Fair  Language: Good   Psychomotor Activity  Psychomotor Activity: Psychomotor Activity: Normal  Assets  Assets: Armed forces logistics/support/administrative officer; Resilience; Social Support   Sleep  Sleep: Sleep: Poor Number of Hours of Sleep: 4   Nutritional  Assessment (For OBS and FBC admissions only) Has the patient had a weight loss or gain of 10 pounds or more in the last 3 months?: No Has the patient had a decrease in food intake/or appetite?: No Does the patient have dental problems?: No Does the patient have eating habits or behaviors that may be indicators of an eating disorder including binging or inducing vomiting?: No Has the patient recently lost weight without trying?: 0 Has the patient been eating poorly because of a decreased appetite?: 0 Malnutrition Screening Tool Score: 0    Physical Exam HENT:     Head: Normocephalic.     Nose: Nose normal.  Eyes:     Pupils: Pupils are equal, round, and reactive to light.  Cardiovascular:     Rate and Rhythm: Normal rate.  Pulmonary:     Effort: Pulmonary effort is normal.  Abdominal:     General: Abdomen is flat.  Musculoskeletal:        General: Normal range of motion.     Cervical back: Normal range of motion.  Skin:    General: Skin is warm.  Neurological:     Mental Status: He is alert and oriented to person, place, and time.  Psychiatric:        Attention and Perception: Attention normal. He perceives visual hallucinations.        Mood and Affect: Mood is anxious and depressed.        Speech: Speech normal.        Behavior: Behavior is cooperative.        Thought Content: Thought content includes suicidal ideation. Thought content includes suicidal plan.        Judgment: Judgment is impulsive.    Review of Systems  Constitutional: Negative.   HENT: Negative.    Eyes: Negative.   Respiratory: Negative.    Cardiovascular: Negative.   Gastrointestinal: Negative.   Genitourinary: Negative.   Musculoskeletal: Negative.   Skin: Negative.   Neurological: Negative.   Endo/Heme/Allergies: Negative.   Psychiatric/Behavioral:  Positive for depression, substance abuse and suicidal ideas. The patient is nervous/anxious.     Blood pressure 130/89, pulse (!) 106,  temperature 98.7 F (37.1 C), temperature source Oral, resp. rate 18, SpO2 100 %. There is no height or weight on file to calculate BMI.  Past Psychiatric History: Alcohol use disorder, polysubstance abuse, suicidal ideation, GC FBC 2023, facility in [place]  Is the patient at risk to self? Yes  Has the patient been a risk to self in the past 6 months? Yes .    Has the patient been a risk to self within the distant past? Yes   Is the patient a risk to others? No   Has the patient been a risk to others in the past 6 months? Yes   Has the patient been a risk to others within the distant past? Yes   Past Medical History:   Heart failure ([place])     HFrEF (heart failure with reduced ejection fraction) ([facility])      15% EF as of 2019   HIV (human immunodeficiency virus infection) ([place])     Hypertension     Polysubstance abuse ([place])     Pulmonary embolism ([place])     Single subsegmental pulmonary embolism without acute cor pulmonale ([place]) 07/23/2021      Family  History:   Lupus Mother   HIV Mother   HIV Father    Social History: 68 y/o divorced, unemployed, receiving disability, currently living a Materials engineer.   Last Labs:  Admission on 12/21/2022  Component Date Value Ref Range Status   SARS Coronavirus 2 by RT PCR 12/22/2022 NEGATIVE  NEGATIVE Final   Influenza A by PCR 12/22/2022 NEGATIVE  NEGATIVE Final   Influenza B by PCR 12/22/2022 NEGATIVE  NEGATIVE Final   Comment: (NOTE) The Xpert Xpress SARS-CoV-2/FLU/RSV plus assay is intended as an aid in the diagnosis of influenza from Nasopharyngeal swab specimens and should not be used as a sole basis for treatment. Nasal washings and aspirates are unacceptable for Xpert Xpress SARS-CoV-2/FLU/RSV testing.  Fact Sheet for Patients: EntrepreneurPulse.com.au  Fact Sheet for Healthcare Providers: IncredibleEmployment.be  This test is not yet approved or cleared by the [place] FDA and has been  authorized for detection and/or diagnosis of SARS-CoV-2 by FDA under an Emergency Use Authorization (EUA). This EUA will remain in effect (meaning this test can be used) for the duration of the COVID-19 declaration under Section 564(b)(1) of the Act, 21 U.S.C. section 360bbb-3(b)(1), unless the authorization is terminated or revoked.     Resp Syncytial Virus by PCR 12/22/2022 NEGATIVE  NEGATIVE Final   Comment: (NOTE) Fact Sheet for Patients: EntrepreneurPulse.com.au  Fact Sheet for Healthcare Providers: IncredibleEmployment.be  This test is not yet approved or cleared by the [place] FDA and has been authorized for detection and/or diagnosis of SARS-CoV-2 by FDA under an Emergency Use Authorization (EUA). This EUA will remain in effect (meaning this test can be used) for the duration of the COVID-19 declaration under Section 564(b)(1) of the Act, 21 U.S.C. section 360bbb-3(b)(1), unless the authorization is terminated or revoked.  Performed at [facility], [place] [address] [place], [place] [address]    WBC 12/22/2022 3.6 (L)  4.0 - 10.5 K/uL Final   RBC 12/22/2022 4.45  4.22 - 5.81 MIL/uL Final   Hemoglobin 12/22/2022 14.0  13.0 - 17.0 g/dL Final   HCT 12/22/2022 40.6  39.0 - 52.0 % Final   MCV 12/22/2022 91.2  80.0 - 100.0 fL Final   MCH 12/22/2022 31.5  26.0 - 34.0 pg Final   MCHC 12/22/2022 34.5  30.0 - 36.0 g/dL Final   RDW 12/22/2022 14.5  11.5 - 15.5 % Final   Platelets 12/22/2022 308  150 - 400 K/uL Final   nRBC 12/22/2022 0.0  0.0 - 0.2 % Final   Neutrophils Relative % 12/22/2022 45  % Final   Neutro Abs 12/22/2022 1.6 (L)  1.7 - 7.7 K/uL Final   Lymphocytes Relative 12/22/2022 41  % Final   Lymphs Abs 12/22/2022 1.5  0.7 - 4.0 K/uL Final   Monocytes Relative 12/22/2022 10  % Final   Monocytes Absolute 12/22/2022 0.4  0.1 - 1.0 K/uL Final   Eosinophils Relative 12/22/2022 2  % Final   Eosinophils Absolute 12/22/2022  0.1  0.0 - 0.5 K/uL Final   Basophils Relative 12/22/2022 1  % Final   Basophils Absolute 12/22/2022 0.0  0.0 - 0.1 K/uL Final   Immature Granulocytes 12/22/2022 1  % Final   Abs Immature Granulocytes 12/22/2022 0.02  0.00 - 0.07 K/uL Final   Performed at [facility], [place] [address] [place], [place] [address]   Sodium 12/22/2022 137  135 - 145 mmol/L Final   Potassium 12/22/2022 3.5  3.5 - 5.1 mmol/L Final   Chloride  12/22/2022 102  98 - 111 mmol/L Final   CO2 12/22/2022 23  22 - 32 mmol/L Final   Glucose, Bld 12/22/2022 92  70 - 99 mg/dL Final   Glucose reference range applies only to samples taken after fasting for at least 8 hours.   BUN 12/22/2022 9  6 - 20 mg/dL Final   Creatinine, Ser 12/22/2022 1.00  0.61 - 1.24 mg/dL Final   Calcium 12/22/2022 8.9  8.9 - 10.3 mg/dL Final   Total Protein 12/22/2022 8.3 (H)  6.5 - 8.1 g/dL Final   Albumin 12/22/2022 3.6  3.5 - 5.0 g/dL Final   AST 12/22/2022 30  15 - 41 U/L Final   ALT 12/22/2022 23  0 - 44 U/L Final   Alkaline Phosphatase 12/22/2022 55  38 - 126 U/L Final   Total Bilirubin 12/22/2022 1.0  0.3 - 1.2 mg/dL Final   GFR, Estimated 12/22/2022 >60  >60 mL/min Final   Comment: (NOTE) Calculated using the CKD-EPI Creatinine Equation (2021)    Anion gap 12/22/2022 12  5 - 15 Final   Performed at [place] [address] [place], [place] [address]   Alcohol, Ethyl (B) 12/22/2022 156 (H)  <10 mg/dL Final   Comment: (NOTE) Lowest detectable limit for serum alcohol is 10 mg/dL.  For medical purposes only. Performed at [facility], [place] [address] [place], [place] [address]    SARSCOV2ONAVIRUS 2 AG 12/22/2022 NEGATIVE  NEGATIVE Final   Comment: (NOTE) SARS-CoV-2 antigen NOT DETECTED.   Negative results are presumptive.  Negative results do not preclude SARS-CoV-2 infection and should not be used as the sole basis for treatment or other patient management decisions, including infection  control decisions,  particularly in the presence of clinical signs and  symptoms consistent with COVID-19, or in those who have been in contact with the virus.  Negative results must be combined with clinical observations, patient history, and epidemiological information. The expected result is Negative.  Fact Sheet for Patients: HandmadeRecipes.com.cy  Fact Sheet for Healthcare Providers: FuneralLife.at  This test is not yet approved or cleared by the [place] FDA and  has been authorized for detection and/or diagnosis of SARS-CoV-2 by FDA under an Emergency Use Authorization (EUA).  This EUA will remain in effect (meaning this test can be used) for the duration of  the COV                          ID-19 declaration under Section 564(b)(1) of the Act, 21 U.S.C. section 360bbb-3(b)(1), unless the authorization is terminated or revoked sooner.    Admission on 12/17/2022, Discharged on 12/18/2022  Component Date Value Ref Range Status   Glucose-Capillary 12/17/2022 89  70 - 99 mg/dL Final   Glucose reference range applies only to samples taken after fasting for at least 8 hours.   Comment 1 12/17/2022 Notify RN   Final   Sodium 12/17/2022 133 (L)  135 - 145 mmol/L Final   Potassium 12/17/2022 3.2 (L)  3.5 - 5.1 mmol/L Final   Chloride 12/17/2022 105  98 - 111 mmol/L Final   CO2 12/17/2022 20 (L)  22 - 32 mmol/L Final   Glucose, Bld 12/17/2022 89  70 - 99 mg/dL Final   Glucose reference range applies only to samples taken after fasting for at least 8 hours.   BUN 12/17/2022 13  6 - 20 mg/dL Final   Creatinine, Ser 12/17/2022 0.97  0.61 - 1.24 mg/dL Final   Calcium 12/17/2022 8.1 (L)  8.9 - 10.3 mg/dL Final   Total Protein 12/17/2022 9.0 (H)  6.5 - 8.1 g/dL Final   Albumin 12/17/2022 3.9  3.5 - 5.0 g/dL Final   AST 12/17/2022 37  15 - 41 U/L Final   ALT 12/17/2022 25  0 - 44 U/L Final   Alkaline Phosphatase 12/17/2022 54  38 - 126 U/L Final   Total  Bilirubin 12/17/2022 0.5  0.3 - 1.2 mg/dL Final   GFR, Estimated 12/17/2022 >60  >60 mL/min Final   Comment: (NOTE) Calculated using the CKD-EPI Creatinine Equation (2021)    Anion gap 12/17/2022 8  5 - 15 Final   Performed at [facility], [place] [address], [place], [place] [id number]   Salicylate Lvl 123XX123 <7.0 (L)  7.0 - 30.0 mg/dL Final   Performed at [place] [address], [place], [place] [address]   Acetaminophen (Tylenol), Serum 12/17/2022 <10 (L)  10 - 30 ug/mL Final   Comment: (NOTE) Therapeutic concentrations vary significantly. A range of 10-30 ug/mL  may be an effective concentration for many patients. However, some  are best treated at concentrations outside of this range. Acetaminophen concentrations >150 ug/mL at 4 hours after ingestion  and >50 ug/mL at 12 hours after ingestion are often associated with  toxic reactions.  Performed at [facility], [place] [address], [place], [place] [address]    Alcohol, Ethyl (B) 12/17/2022 198 (H)  <10 mg/dL Final   Comment: (NOTE) Lowest detectable limit for serum alcohol is 10 mg/dL.  For medical purposes only. Performed at [facility], [place] [address], [place], [place] [address]    Opiates 12/17/2022 NONE DETECTED  NONE DETECTED Final   Cocaine 12/17/2022 NONE DETECTED  NONE DETECTED Final   Benzodiazepines 12/17/2022 NONE DETECTED  NONE DETECTED Final   Amphetamines 12/17/2022 NONE DETECTED  NONE DETECTED Final   Tetrahydrocannabinol 12/17/2022 NONE DETECTED  NONE DETECTED Final   Barbiturates 12/17/2022 NONE DETECTED  NONE DETECTED Final   Comment: (NOTE) DRUG SCREEN FOR MEDICAL PURPOSES ONLY.  IF CONFIRMATION IS NEEDED FOR ANY PURPOSE, NOTIFY LAB WITHIN 5 DAYS.  LOWEST DETECTABLE LIMITS FOR URINE DRUG SCREEN Drug Class                     Cutoff (ng/mL) Amphetamine and metabolites    1000 Barbiturate and metabolites     200 Benzodiazepine                 200 Opiates and metabolites        300 Cocaine and metabolites        300 THC                            50 Performed at [facility], [place] [address], [place], [place] [address]    WBC 12/17/2022 4.8  4.0 - 10.5 K/uL Final   RBC 12/17/2022 4.29  4.22 - 5.81 MIL/uL Final   Hemoglobin 12/17/2022 13.4  13.0 - 17.0 g/dL Final   HCT 12/17/2022 40.0  39.0 - 52.0 % Final   MCV 12/17/2022 93.2  80.0 - 100.0 fL Final   MCH 12/17/2022 31.2  26.0 - 34.0 pg Final   MCHC 12/17/2022 33.5  30.0 - 36.0 g/dL Final   RDW 12/17/2022 14.6  11.5 - 15.5 % Final   Platelets  12/17/2022 296  150 - 400 K/uL Final   nRBC 12/17/2022 0.0  0.0 - 0.2 % Final   Neutrophils Relative % 12/17/2022 55  % Final   Neutro Abs 12/17/2022 2.6  1.7 - 7.7 K/uL Final   Lymphocytes Relative 12/17/2022 31  % Final   Lymphs Abs 12/17/2022 1.5  0.7 - 4.0 K/uL Final   Monocytes Relative 12/17/2022 11  % Final   Monocytes Absolute 12/17/2022 0.5  0.1 - 1.0 K/uL Final   Eosinophils Relative 12/17/2022 3  % Final   Eosinophils Absolute 12/17/2022 0.1  0.0 - 0.5 K/uL Final   Basophils Relative 12/17/2022 0  % Final   Basophils Absolute 12/17/2022 0.0  0.0 - 0.1 K/uL Final   Immature Granulocytes 12/17/2022 0  % Final   Abs Immature Granulocytes 12/17/2022 0.02  0.00 - 0.07 K/uL Final   Performed at [facility], [place] [address], [place], [place] [address]  Admission on 12/13/2022, Discharged on 12/14/2022  Component Date Value Ref Range Status   Glucose-Capillary 12/13/2022 85  70 - 99 mg/dL Final   Glucose reference range applies only to samples taken after fasting for at least 8 hours.   Alcohol, Ethyl (B) 12/13/2022 133 (H)  <10 mg/dL Final   Comment: (NOTE) Lowest detectable limit for serum alcohol is 10 mg/dL.  For medical purposes only. Performed at [facility], [place] [address], [place], [place] [address]    WBC 12/13/2022 4.0  4.0 - 10.5 K/uL  Final   RBC 12/13/2022 4.20 (L)  4.22 - 5.81 MIL/uL Final   Hemoglobin 12/13/2022 13.2  13.0 - 17.0 g/dL Final   HCT 12/13/2022 38.6 (L)  39.0 - 52.0 % Final   MCV 12/13/2022 91.9  80.0 - 100.0 fL Final   MCH 12/13/2022 31.4  26.0 - 34.0 pg Final   MCHC 12/13/2022 34.2  30.0 - 36.0 g/dL Final   RDW 12/13/2022 14.4  11.5 - 15.5 % Final   Platelets 12/13/2022 272  150 - 400 K/uL Final   nRBC 12/13/2022 0.0  0.0 - 0.2 % Final   Performed at [facility], [place] [address] [place], [place] [address]   Sodium 12/13/2022 137  135 - 145 mmol/L Final   Potassium 12/13/2022 3.5  3.5 - 5.1 mmol/L Final   Chloride 12/13/2022 105  98 - 111 mmol/L Final   CO2 12/13/2022 21 (L)  22 - 32 mmol/L Final   Glucose, Bld 12/13/2022 81  70 - 99 mg/dL Final   Glucose reference range applies only to samples taken after fasting for at least 8 hours.   BUN 12/13/2022 10  6 - 20 mg/dL Final   Creatinine, Ser 12/13/2022 0.95  0.61 - 1.24 mg/dL Final   Calcium 12/13/2022 8.8 (L)  8.9 - 10.3 mg/dL Final   Total Protein 12/13/2022 8.5 (H)  6.5 - 8.1 g/dL Final   Albumin 12/13/2022 3.5  3.5 - 5.0 g/dL Final   AST 12/13/2022 34  15 - 41 U/L Final   ALT 12/13/2022 29  0 - 44 U/L Final   Alkaline Phosphatase 12/13/2022 56  38 - 126 U/L Final   Total Bilirubin 12/13/2022 0.8  0.3 - 1.2 mg/dL Final   GFR, Estimated 12/13/2022 >60  >60 mL/min Final   Comment: (NOTE) Calculated using the CKD-EPI Creatinine Equation (2021)    Anion gap 12/13/2022 11  5 - 15 Final   Performed at [facility], [place] [address],  [place], [place] [address]   Acetaminophen (Tylenol), Serum 12/14/2022 <10 (L)  10 - 30 ug/mL Final   Comment: (NOTE) Therapeutic concentrations vary significantly. A range of 10-30 ug/mL  may be an effective concentration for many patients. However, some  are best treated at concentrations outside of this range. Acetaminophen concentrations >150 ug/mL at 4 hours after ingestion  and >50 ug/mL at 12 hours  after ingestion are often associated with  toxic reactions.  Performed at [facility], [place] [address], [place], [place] [id number]    Salicylate Lvl [id number] <7.0 (L)  7.0 - 30.0 mg/dL Final   Performed at [place] [address], [place], [place] [address]  Admission on 12/13/2022, Discharged on 12/13/2022  Component Date Value Ref Range Status   SARS Coronavirus 2 by RT PCR 12/13/2022 NEGATIVE  NEGATIVE Final   Influenza A by PCR 12/13/2022 NEGATIVE  NEGATIVE Final   Influenza B by PCR 12/13/2022 NEGATIVE  NEGATIVE Final   Comment: (NOTE) The Xpert Xpress SARS-CoV-2/FLU/RSV plus assay is intended as an aid in the diagnosis of influenza from Nasopharyngeal swab specimens and should not be used as a sole basis for treatment. Nasal washings and aspirates are unacceptable for Xpert Xpress SARS-CoV-2/FLU/RSV testing.  Fact Sheet for Patients: EntrepreneurPulse.com.au  Fact Sheet for Healthcare Providers: IncredibleEmployment.be  This test is not yet approved or cleared by the [place] FDA and has been authorized for detection and/or diagnosis of SARS-CoV-2 by FDA under an Emergency Use Authorization (EUA). This EUA will remain in effect (meaning this test can be used) for the duration of the COVID-19 declaration under Section 564(b)(1) of the Act, 21 U.S.C. section 360bbb-3(b)(1), unless the authorization is terminated or revoked.     Resp Syncytial Virus by PCR 12/13/2022 NEGATIVE  NEGATIVE Final   Comment: (NOTE) Fact Sheet for Patients: EntrepreneurPulse.com.au  Fact Sheet for Healthcare Providers: IncredibleEmployment.be  This test is not yet approved or cleared by the [place] FDA and has been authorized for detection and/or diagnosis of SARS-CoV-2 by FDA under an Emergency Use Authorization (EUA). This EUA will remain in effect (meaning this test can be used) for the  duration of the COVID-19 declaration under Section 564(b)(1) of the Act, 21 U.S.C. section 360bbb-3(b)(1), unless the authorization is terminated or revoked.  Performed at [facility], [place] [address] [place], [place] [address]    WBC 12/13/2022 3.7 (L)  4.0 - 10.5 K/uL Final   RBC 12/13/2022 4.18 (L)  4.22 - 5.81 MIL/uL Final   Hemoglobin 12/13/2022 12.9 (L)  13.0 - 17.0 g/dL Final   HCT 12/13/2022 39.1  39.0 - 52.0 % Final   MCV 12/13/2022 93.5  80.0 - 100.0 fL Final   MCH 12/13/2022 30.9  26.0 - 34.0 pg Final   MCHC 12/13/2022 33.0  30.0 - 36.0 g/dL Final   RDW 12/13/2022 14.5  11.5 - 15.5 % Final   Platelets 12/13/2022 268  150 - 400 K/uL Final   nRBC 12/13/2022 0.0  0.0 - 0.2 % Final   Neutrophils Relative % 12/13/2022 55  % Final   Neutro Abs 12/13/2022 2.0  1.7 - 7.7 K/uL Final   Lymphocytes Relative 12/13/2022 34  % Final   Lymphs Abs 12/13/2022 1.3  0.7 - 4.0 K/uL Final   Monocytes Relative 12/13/2022 8  % Final   Monocytes Absolute 12/13/2022 0.3  0.1 - 1.0 K/uL Final   Eosinophils Relative 12/13/2022 3  % Final   Eosinophils Absolute  12/13/2022 0.1  0.0 - 0.5 K/uL Final   Basophils Relative 12/13/2022 0  % Final   Basophils Absolute 12/13/2022 0.0  0.0 - 0.1 K/uL Final   Immature Granulocytes 12/13/2022 0  % Final   Abs Immature Granulocytes 12/13/2022 0.01  0.00 - 0.07 K/uL Final   Performed at [facility], [place] [address], [place], [place] [address]   Sodium 12/13/2022 137  135 - 145 mmol/L Final   Potassium 12/13/2022 3.4 (L)  3.5 - 5.1 mmol/L Final   Chloride 12/13/2022 104  98 - 111 mmol/L Final   CO2 12/13/2022 20 (L)  22 - 32 mmol/L Final   Glucose, Bld 12/13/2022 85  70 - 99 mg/dL Final   Glucose reference range applies only to samples taken after fasting for at least 8 hours.   BUN 12/13/2022 10  6 - 20 mg/dL Final   Creatinine, Ser 12/13/2022 0.91  0.61 - 1.24 mg/dL Final   Calcium 12/13/2022 8.6 (L)  8.9 - 10.3 mg/dL Final   Total Protein  12/13/2022 8.1  6.5 - 8.1 g/dL Final   Albumin 12/13/2022 3.4 (L)  3.5 - 5.0 g/dL Final   AST 12/13/2022 36  15 - 41 U/L Final   ALT 12/13/2022 29  0 - 44 U/L Final   Alkaline Phosphatase 12/13/2022 56  38 - 126 U/L Final   Total Bilirubin 12/13/2022 0.7  0.3 - 1.2 mg/dL Final   GFR, Estimated 12/13/2022 >60  >60 mL/min Final   Comment: (NOTE) Calculated using the CKD-EPI Creatinine Equation (2021)    Anion gap 12/13/2022 13  5 - 15 Final   Performed at [facility], [place] [address], [place], [place] [address]   Alcohol, Ethyl (B) 12/13/2022 136 (H)  <10 mg/dL Final   Comment: (NOTE) Lowest detectable limit for serum alcohol is 10 mg/dL.  For medical purposes only. Performed at [facility], [place] [address] [place], [place] [address]    POC Amphetamine UR 12/13/2022 None Detected  NONE DETECTED (Cut Off Level 1000 ng/mL) Final   POC Secobarbital (BAR) 12/13/2022 None Detected  NONE DETECTED (Cut Off Level 300 ng/mL) Final   POC Buprenorphine (BUP) 12/13/2022 None Detected  NONE DETECTED (Cut Off Level 10 ng/mL) Final   POC Oxazepam (BZO) 12/13/2022 None Detected  NONE DETECTED (Cut Off Level 300 ng/mL) Final   POC Cocaine UR 12/13/2022 None Detected  NONE DETECTED (Cut Off Level 300 ng/mL) Final   POC Methamphetamine UR 12/13/2022 None Detected  NONE DETECTED (Cut Off Level 1000 ng/mL) Final   POC Morphine 12/13/2022 None Detected  NONE DETECTED (Cut Off Level 300 ng/mL) Final   POC Methadone UR 12/13/2022 None Detected  NONE DETECTED (Cut Off Level 300 ng/mL) Final   POC Oxycodone UR 12/13/2022 None Detected  NONE DETECTED (Cut Off Level 100 ng/mL) Final   POC Marijuana UR 12/13/2022 None Detected  NONE DETECTED (Cut Off Level 50 ng/mL) Final   SARSCOV2ONAVIRUS 2 AG 12/13/2022 NEGATIVE  NEGATIVE Final   Comment: (NOTE) SARS-CoV-2 antigen NOT DETECTED.   Negative results are presumptive.  Negative results do not preclude SARS-CoV-2 infection and should not be used as the  sole basis for treatment or other patient management decisions, including infection  control decisions, particularly in the presence of clinical signs and  symptoms consistent with COVID-19, or in those who have been in contact with the virus.  Negative results must be combined with clinical observations, patient history, and epidemiological information.  The expected result is Negative.  Fact Sheet for Patients: HandmadeRecipes.com.cy  Fact Sheet for Healthcare Providers: FuneralLife.at  This test is not yet approved or cleared by the [place] FDA and  has been authorized for detection and/or diagnosis of SARS-CoV-2 by FDA under an Emergency Use Authorization (EUA).  This EUA will remain in effect (meaning this test can be used) for the duration of  the COV                          ID-19 declaration under Section 564(b)(1) of the Act, 21 U.S.C. section 360bbb-3(b)(1), unless the authorization is terminated or revoked sooner.    Admission on 12/11/2022, Discharged on 12/11/2022  Component Date Value Ref Range Status   Troponin I (High Sensitivity) 12/11/2022 8  <18 ng/L Final   Comment: (NOTE) Elevated high sensitivity troponin I (hsTnI) values and significant  changes across serial measurements may suggest ACS but many other  chronic and acute conditions are known to elevate hsTnI results.  Refer to the "Links" section for chest pain algorithms and additional  guidance. Performed at [facility], [place] [address] [place], [place] [address]    WBC 12/11/2022 3.5 (L)  4.0 - 10.5 K/uL Final   RBC 12/11/2022 4.00 (L)  4.22 - 5.81 MIL/uL Final   Hemoglobin 12/11/2022 12.3 (L)  13.0 - 17.0 g/dL Final   HCT 12/11/2022 37.7 (L)  39.0 - 52.0 % Final   MCV 12/11/2022 94.3  80.0 - 100.0 fL Final   MCH 12/11/2022 30.8  26.0 - 34.0 pg Final   MCHC 12/11/2022 32.6  30.0 - 36.0 g/dL Final   RDW 12/11/2022 14.5  11.5 - 15.5 % Final    Platelets 12/11/2022 261  150 - 400 K/uL Final   nRBC 12/11/2022 0.0  0.0 - 0.2 % Final   Neutrophils Relative % 12/11/2022 47  % Final   Neutro Abs 12/11/2022 1.7  1.7 - 7.7 K/uL Final   Lymphocytes Relative 12/11/2022 39  % Final   Lymphs Abs 12/11/2022 1.4  0.7 - 4.0 K/uL Final   Monocytes Relative 12/11/2022 10  % Final   Monocytes Absolute 12/11/2022 0.4  0.1 - 1.0 K/uL Final   Eosinophils Relative 12/11/2022 3  % Final   Eosinophils Absolute 12/11/2022 0.1  0.0 - 0.5 K/uL Final   Basophils Relative 12/11/2022 1  % Final   Basophils Absolute 12/11/2022 0.0  0.0 - 0.1 K/uL Final   Immature Granulocytes 12/11/2022 0  % Final   Abs Immature Granulocytes 12/11/2022 0.01  0.00 - 0.07 K/uL Final   Performed at [facility], [place] [address], [place], [place] [address]   Sodium 12/11/2022 137  135 - 145 mmol/L Final   Potassium 12/11/2022 3.2 (L)  3.5 - 5.1 mmol/L Final   Chloride 12/11/2022 104  98 - 111 mmol/L Final   CO2 12/11/2022 20 (L)  22 - 32 mmol/L Final   Glucose, Bld 12/11/2022 85  70 - 99 mg/dL Final   Glucose reference range applies only to samples taken after fasting for at least 8 hours.   BUN 12/11/2022 11  6 - 20 mg/dL Final   Creatinine, Ser 12/11/2022 1.07  0.61 - 1.24 mg/dL Final   Calcium 12/11/2022 8.4 (L)  8.9 - 10.3 mg/dL Final   GFR, Estimated 12/11/2022 >60  >60 mL/min Final   Comment: (NOTE) Calculated using the CKD-EPI Creatinine Equation (2021)    Anion  gap 12/11/2022 13  5 - 15 Final   Performed at [facility], [place] [address] [place], [place] [address]  Admission on 12/06/2022, Discharged on 12/06/2022  Component Date Value Ref Range Status   SARS Coronavirus 2 by RT PCR 12/06/2022 NEGATIVE  NEGATIVE Final   Influenza A by PCR 12/06/2022 NEGATIVE  NEGATIVE Final   Influenza B by PCR 12/06/2022 NEGATIVE  NEGATIVE Final   Comment: (NOTE) The Xpert Xpress SARS-CoV-2/FLU/RSV plus assay is intended as an aid in the diagnosis of influenza from  Nasopharyngeal swab specimens and should not be used as a sole basis for treatment. Nasal washings and aspirates are unacceptable for Xpert Xpress SARS-CoV-2/FLU/RSV testing.  Fact Sheet for Patients: EntrepreneurPulse.com.au  Fact Sheet for Healthcare Providers: IncredibleEmployment.be  This test is not yet approved or cleared by the [place] FDA and has been authorized for detection and/or diagnosis of SARS-CoV-2 by FDA under an Emergency Use Authorization (EUA). This EUA will remain in effect (meaning this test can be used) for the duration of the COVID-19 declaration under Section 564(b)(1) of the Act, 21 U.S.C. section 360bbb-3(b)(1), unless the authorization is terminated or revoked.     Resp Syncytial Virus by PCR 12/06/2022 NEGATIVE  NEGATIVE Final   Comment: (NOTE) Fact Sheet for Patients: EntrepreneurPulse.com.au  Fact Sheet for Healthcare Providers: IncredibleEmployment.be  This test is not yet approved or cleared by the [place] FDA and has been authorized for detection and/or diagnosis of SARS-CoV-2 by FDA under an Emergency Use Authorization (EUA). This EUA will remain in effect (meaning this test can be used) for the duration of the COVID-19 declaration under Section 564(b)(1) of the Act, 21 U.S.C. section 360bbb-3(b)(1), unless the authorization is terminated or revoked.  Performed at [facility], [place] [address] [place], [place] [address]    WBC 12/06/2022 4.8  4.0 - 10.5 K/uL Final   RBC 12/06/2022 4.34  4.22 - 5.81 MIL/uL Final   Hemoglobin 12/06/2022 13.5  13.0 - 17.0 g/dL Final   HCT 12/06/2022 39.4  39.0 - 52.0 % Final   MCV 12/06/2022 90.8  80.0 - 100.0 fL Final   MCH 12/06/2022 31.1  26.0 - 34.0 pg Final   MCHC 12/06/2022 34.3  30.0 - 36.0 g/dL Final   RDW 12/06/2022 14.1  11.5 - 15.5 % Final   Platelets 12/06/2022 285  150 - 400 K/uL Final   nRBC 12/06/2022  0.0  0.0 - 0.2 % Final   Neutrophils Relative % 12/06/2022 47  % Final   Neutro Abs 12/06/2022 2.3  1.7 - 7.7 K/uL Final   Lymphocytes Relative 12/06/2022 42  % Final   Lymphs Abs 12/06/2022 2.0  0.7 - 4.0 K/uL Final   Monocytes Relative 12/06/2022 9  % Final   Monocytes Absolute 12/06/2022 0.4  0.1 - 1.0 K/uL Final   Eosinophils Relative 12/06/2022 2  % Final   Eosinophils Absolute 12/06/2022 0.1  0.0 - 0.5 K/uL Final   Basophils Relative 12/06/2022 0  % Final   Basophils Absolute 12/06/2022 0.0  0.0 - 0.1 K/uL Final   Immature Granulocytes 12/06/2022 0  % Final   Abs Immature Granulocytes 12/06/2022 0.01  0.00 - 0.07 K/uL Final   Performed at [facility], [place] [address] [place], [place] [address]   Sodium 12/06/2022 139  135 - 145 mmol/L Final   Potassium 12/06/2022 3.4 (L)  3.5 - 5.1 mmol/L Final   Chloride 12/06/2022 104  98 - 111 mmol/L  Final   CO2 12/06/2022 22  22 - 32 mmol/L Final   Glucose, Bld 12/06/2022 93  70 - 99 mg/dL Final   Glucose reference range applies only to samples taken after fasting for at least 8 hours.   BUN 12/06/2022 13  6 - 20 mg/dL Final   Creatinine, Ser 12/06/2022 1.18  0.61 - 1.24 mg/dL Final   Calcium 12/06/2022 8.9  8.9 - 10.3 mg/dL Final   Total Protein 12/06/2022 8.2 (H)  6.5 - 8.1 g/dL Final   Albumin 12/06/2022 3.5  3.5 - 5.0 g/dL Final   AST 12/06/2022 45 (H)  15 - 41 U/L Final   ALT 12/06/2022 42  0 - 44 U/L Final   Alkaline Phosphatase 12/06/2022 59  38 - 126 U/L Final   Total Bilirubin 12/06/2022 0.7  0.3 - 1.2 mg/dL Final   GFR, Estimated 12/06/2022 >60  >60 mL/min Final   Comment: (NOTE) Calculated using the CKD-EPI Creatinine Equation (2021)    Anion gap 12/06/2022 13  5 - 15 Final   Performed at [place] [address], [place], [place] [address]   Hgb A1c MFr Bld 12/06/2022 4.9  4.8 - 5.6 % Final   Comment: (NOTE) Pre diabetes:          5.7%-6.4%  Diabetes:              >6.4%  Glycemic control for    <7.0% adults with diabetes    Mean Plasma Glucose 12/06/2022 93.93  mg/dL Final   Performed at [facility], [place] [address] [place], [place] [address]   Alcohol, Ethyl (B) 12/06/2022 258 (H)  <10 mg/dL Final   Comment: (NOTE) Lowest detectable limit for serum alcohol is 10 mg/dL.  For medical purposes only. Performed at [facility], [place] [address], [place], [place] [address]    Cholesterol 12/06/2022 147  0 - 200 mg/dL Final   Triglycerides 12/06/2022 216 (H)  <150 mg/dL Final   HDL 12/06/2022 54  >40 mg/dL Final   Total CHOL/HDL Ratio 12/06/2022 2.7  RATIO Final   VLDL 12/06/2022 43 (H)  0 - 40 mg/dL Final   LDL Cholesterol 12/06/2022 50  0 - 99 mg/dL Final   Comment:        Total Cholesterol/HDL:CHD Risk Coronary Heart Disease Risk Table                     Men   Women  1/2 Average Risk   3.4   3.3  Average Risk       5.0   4.4  2 X Average Risk   9.6   7.1  3 X Average Risk  23.4   11.0        Use the calculated Patient Ratio above and the CHD Risk Table to determine the patient's CHD Risk.        ATP III CLASSIFICATION (LDL):  <100     mg/dL   Optimal  100-129  mg/dL   Near or Above                    Optimal  130-159  mg/dL   Borderline  160-189  mg/dL   High  >190     mg/dL   Very High Performed at [place] [address], [place], [place] [address]    TSH 12/06/2022 0.841  0.350 - 4.500 uIU/mL Final   Comment: Performed by  a 3rd Generation assay with a functional sensitivity of <=0.01 uIU/mL. Performed at [facility], [place] [address], [place], [place] [address]    SARSCOV2ONAVIRUS 2 AG 12/06/2022 NEGATIVE  NEGATIVE Final   Comment: (NOTE) SARS-CoV-2 antigen NOT DETECTED.   Negative results are presumptive.  Negative results do not preclude SARS-CoV-2 infection and should not be used as the sole basis for treatment or other patient management decisions, including infection  control decisions, particularly in the presence of  clinical signs and  symptoms consistent with COVID-19, or in those who have been in contact with the virus.  Negative results must be combined with clinical observations, patient history, and epidemiological information. The expected result is Negative.  Fact Sheet for Patients: HandmadeRecipes.com.cy  Fact Sheet for Healthcare Providers: FuneralLife.at  This test is not yet approved or cleared by the [place] FDA and  has been authorized for detection and/or diagnosis of SARS-CoV-2 by FDA under an Emergency Use Authorization (EUA).  This EUA will remain in effect (meaning this test can be used) for the duration of  the COV                          ID-19 declaration under Section 564(b)(1) of the Act, 21 U.S.C. section 360bbb-3(b)(1), unless the authorization is terminated or revoked sooner.    Admission on 12/03/2022, Discharged on 12/03/2022  Component Date Value Ref Range Status   SARS Coronavirus 2 by RT PCR 12/03/2022 NEGATIVE  NEGATIVE Final   Comment: (NOTE) SARS-CoV-2 target nucleic acids are NOT DETECTED.  The SARS-CoV-2 RNA is generally detectable in upper respiratory specimens during the acute phase of infection. The lowest concentration of SARS-CoV-2 viral copies this assay can detect is 138 copies/mL. A negative result does not preclude SARS-Cov-2 infection and should not be used as the sole basis for treatment or other patient management decisions. A negative result may occur with  improper specimen collection/handling, submission of specimen other than nasopharyngeal swab, presence of viral mutation(s) within the areas targeted by this assay, and inadequate number of viral copies(<138 copies/mL). A negative result must be combined with clinical observations, patient history, and epidemiological information. The expected result is Negative.  Fact Sheet for Patients:   EntrepreneurPulse.com.au  Fact Sheet for Healthcare Providers:  IncredibleEmployment.be  This test is no                          t yet approved or cleared by the [place] FDA and  has been authorized for detection and/or diagnosis of SARS-CoV-2 by FDA under an Emergency Use Authorization (EUA). This EUA will remain  in effect (meaning this test can be used) for the duration of the COVID-19 declaration under Section 564(b)(1) of the Act, 21 U.S.C.section 360bbb-3(b)(1), unless the authorization is terminated  or revoked sooner.       Influenza A by PCR 12/03/2022 NEGATIVE  NEGATIVE Final   Influenza B by PCR 12/03/2022 NEGATIVE  NEGATIVE Final   Comment: (NOTE) The Xpert Xpress SARS-CoV-2/FLU/RSV plus assay is intended as an aid in the diagnosis of influenza from Nasopharyngeal swab specimens and should not be used as a sole basis for treatment. Nasal washings and aspirates are unacceptable for Xpert Xpress SARS-CoV-2/FLU/RSV testing.  Fact Sheet for Patients: EntrepreneurPulse.com.au  Fact Sheet for Healthcare Providers: IncredibleEmployment.be  This test is not yet approved or cleared by the [place] FDA and has been authorized for detection  and/or diagnosis of SARS-CoV-2 by FDA under an Emergency Use Authorization (EUA). This EUA will remain in effect (meaning this test can be used) for the duration of the COVID-19 declaration under Section 564(b)(1) of the Act, 21 U.S.C. section 360bbb-3(b)(1), unless the authorization is terminated or revoked.     Resp Syncytial Virus by PCR 12/03/2022 NEGATIVE  NEGATIVE Final   Comment: (NOTE) Fact Sheet for Patients: EntrepreneurPulse.com.au  Fact Sheet for Healthcare Providers: IncredibleEmployment.be  This test is not yet approved or cleared by the [place] FDA and has been authorized for detection and/or  diagnosis of SARS-CoV-2 by FDA under an Emergency Use Authorization (EUA). This EUA will remain in effect (meaning this test can be used) for the duration of the COVID-19 declaration under Section 564(b)(1) of the Act, 21 U.S.C. section 360bbb-3(b)(1), unless the authorization is terminated or revoked.  Performed at [facility], [place] [address] [place], [place] [address]    Sodium 12/03/2022 137  135 - 145 mmol/L Final   Potassium 12/03/2022 3.3 (L)  3.5 - 5.1 mmol/L Final   Chloride 12/03/2022 105  98 - 111 mmol/L Final   CO2 12/03/2022 20 (L)  22 - 32 mmol/L Final   Glucose, Bld 12/03/2022 90  70 - 99 mg/dL Final   Glucose reference range applies only to samples taken after fasting for at least 8 hours.   BUN 12/03/2022 15  6 - 20 mg/dL Final   Creatinine, Ser 12/03/2022 0.98  0.61 - 1.24 mg/dL Final   Calcium 12/03/2022 8.3 (L)  8.9 - 10.3 mg/dL Final   GFR, Estimated 12/03/2022 >60  >60 mL/min Final   Comment: (NOTE) Calculated using the CKD-EPI Creatinine Equation (2021)    Anion gap 12/03/2022 12  5 - 15 Final   Performed at [facility], [place] [address] [place], [place] [address]   WBC 12/03/2022 3.0 (L)  4.0 - 10.5 K/uL Final   RBC 12/03/2022 4.21 (L)  4.22 - 5.81 MIL/uL Final   Hemoglobin 12/03/2022 12.9 (L)  13.0 - 17.0 g/dL Final   HCT 12/03/2022 39.0  39.0 - 52.0 % Final   MCV 12/03/2022 92.6  80.0 - 100.0 fL Final   MCH 12/03/2022 30.6  26.0 - 34.0 pg Final   MCHC 12/03/2022 33.1  30.0 - 36.0 g/dL Final   RDW 12/03/2022 14.3  11.5 - 15.5 % Final   Platelets 12/03/2022 283  150 - 400 K/uL Final   nRBC 12/03/2022 0.0  0.0 - 0.2 % Final   Neutrophils Relative % 12/03/2022 52  % Final   Neutro Abs 12/03/2022 1.5 (L)  1.7 - 7.7 K/uL Final   Lymphocytes Relative 12/03/2022 35  % Final   Lymphs Abs 12/03/2022 1.0  0.7 - 4.0 K/uL Final   Monocytes Relative 12/03/2022 11  % Final   Monocytes Absolute 12/03/2022 0.3  0.1 - 1.0 K/uL  Final   Eosinophils Relative 12/03/2022 1  % Final   Eosinophils Absolute 12/03/2022 0.0  0.0 - 0.5 K/uL Final   Basophils Relative 12/03/2022 1  % Final   Basophils Absolute 12/03/2022 0.0  0.0 - 0.1 K/uL Final   Immature Granulocytes 12/03/2022 0  % Final   Abs Immature Granulocytes 12/03/2022 0.01  0.00 - 0.07 K/uL Final   Performed at [facility], [place] [address], [place], [place] [address]   Troponin I (High Sensitivity) 12/03/2022 6  <18 ng/L Final   Comment: (NOTE) Elevated high sensitivity troponin I (hsTnI)  values and significant  changes across serial measurements may suggest ACS but many other  chronic and acute conditions are known to elevate hsTnI results.  Refer to the "Links" section for chest pain algorithms and additional  guidance. Performed at [facility], [place] [address] [place], [place] [address]    D-Dimer, Quant 12/03/2022 0.30  0.00 - 0.50 ug/mL-FEU Final   Comment: (NOTE) At the manufacturer cut-off value of 0.5 g/mL FEU, this assay has a negative predictive value of 95-100%.This assay is intended for use in conjunction with a clinical pretest probability (PTP) assessment model to exclude pulmonary embolism (PE) and deep venous thrombosis (DVT) in outpatients suspected of PE or DVT. Results should be correlated with clinical presentation. Performed at [facility], [place] [address], [place], [place] [address]    Troponin I (High Sensitivity) 12/03/2022 6  <18 ng/L Final   Comment: (NOTE) Elevated high sensitivity troponin I (hsTnI) values and significant  changes across serial measurements may suggest ACS but many other  chronic and acute conditions are known to elevate hsTnI results.  Refer to the "Links" section for chest pain algorithms and additional  guidance. Performed at [facility], [place] [address] [place], [place] [address]   Admission on 11/18/2022, Discharged on  11/19/2022  Component Date Value Ref Range Status   WBC 11/18/2022 5.2  4.0 - 10.5 K/uL Final   RBC 11/18/2022 4.33  4.22 - 5.81 MIL/uL Final   Hemoglobin 11/18/2022 13.4  13.0 - 17.0 g/dL Final   HCT 11/18/2022 39.9  39.0 - 52.0 % Final   MCV 11/18/2022 92.1  80.0 - 100.0 fL Final   MCH 11/18/2022 30.9  26.0 - 34.0 pg Final   MCHC 11/18/2022 33.6  30.0 - 36.0 g/dL Final   RDW 11/18/2022 14.2  11.5 - 15.5 % Final   Platelets 11/18/2022 299  150 - 400 K/uL Final   nRBC 11/18/2022 0.0  0.0 - 0.2 % Final   Performed at [facility], [place] [address] [place], [place] [address]   Troponin I (High Sensitivity) 11/18/2022 6  <18 ng/L Final   Comment: (NOTE) Elevated high sensitivity troponin I (hsTnI) values and significant  changes across serial measurements may suggest ACS but many other  chronic and acute conditions are known to elevate hsTnI results.  Refer to the "Links" section for chest pain algorithms and additional  guidance. Performed at [facility], [place] [address] [place], [place] [address]    Alcohol, Ethyl (B) 11/18/2022 178 (H)  <10 mg/dL Final   Comment: (NOTE) Lowest detectable limit for serum alcohol is 10 mg/dL.  For medical purposes only. Performed at [facility], [place] [address], [place], [place] [address]    Opiates 11/19/2022 NONE DETECTED  NONE DETECTED Final   Cocaine 11/19/2022 NONE DETECTED  NONE DETECTED Final   Benzodiazepines 11/19/2022 NONE DETECTED  NONE DETECTED Final   Amphetamines 11/19/2022 NONE DETECTED  NONE DETECTED Final   Tetrahydrocannabinol 11/19/2022 NONE DETECTED  NONE DETECTED Final   Barbiturates 11/19/2022 NONE DETECTED  NONE DETECTED Final   Comment: (NOTE) DRUG SCREEN FOR MEDICAL PURPOSES ONLY.  IF CONFIRMATION IS NEEDED FOR ANY PURPOSE, NOTIFY LAB WITHIN 5 DAYS.  LOWEST DETECTABLE LIMITS FOR URINE DRUG SCREEN Drug Class                     Cutoff (ng/mL) Amphetamine and metabolites    1000 Barbiturate and  metabolites  200 Benzodiazepine                 200 Opiates and metabolites        300 Cocaine and metabolites        300 THC                            50 Performed at [facility], [place] [address], [place], [place] [address]    Sodium 11/18/2022 136  135 - 145 mmol/L Final   Potassium 11/18/2022 3.2 (L)  3.5 - 5.1 mmol/L Final   Chloride 11/18/2022 105  98 - 111 mmol/L Final   CO2 11/18/2022 20 (L)  22 - 32 mmol/L Final   Glucose, Bld 11/18/2022 96  70 - 99 mg/dL Final   Glucose reference range applies only to samples taken after fasting for at least 8 hours.   BUN 11/18/2022 13  6 - 20 mg/dL Final   Creatinine, Ser 11/18/2022 1.01  0.61 - 1.24 mg/dL Final   Calcium 11/18/2022 8.8 (L)  8.9 - 10.3 mg/dL Final   Total Protein 11/18/2022 8.5 (H)  6.5 - 8.1 g/dL Final   Albumin 11/18/2022 3.5  3.5 - 5.0 g/dL Final   AST 11/18/2022 43 (H)  15 - 41 U/L Final   ALT 11/18/2022 44  0 - 44 U/L Final   Alkaline Phosphatase 11/18/2022 57  38 - 126 U/L Final   Total Bilirubin 11/18/2022 0.9  0.3 - 1.2 mg/dL Final   GFR, Estimated 11/18/2022 >60  >60 mL/min Final   Comment: (NOTE) Calculated using the CKD-EPI Creatinine Equation (2021)    Anion gap 11/18/2022 11  5 - 15 Final   Performed at [place] [address], [place], [place] [address]   Troponin I (High Sensitivity) 11/19/2022 17  <18 ng/L Final   Comment: (NOTE) Elevated high sensitivity troponin I (hsTnI) values and significant  changes across serial measurements may suggest ACS but many other  chronic and acute conditions are known to elevate hsTnI results.  Refer to the "Links" section for chest pain algorithms and additional  guidance. Performed at [facility], [place] [address], [place], [place] [address]   Admission on 11/14/2022, Discharged on 11/14/2022  Component Date Value Ref Range Status   Glucose-Capillary 11/14/2022 104 (H)  70 - 99 mg/dL Final   Glucose reference range applies only to samples  taken after fasting for at least 8 hours.   Comment 1 11/14/2022 Notify RN   Final   Comment 2 11/14/2022 Document in Chart   Final   Sodium 11/14/2022 137  135 - 145 mmol/L Final   Potassium 11/14/2022 3.1 (L)  3.5 - 5.1 mmol/L Final   Chloride 11/14/2022 105  98 - 111 mmol/L Final   CO2 11/14/2022 20 (L)  22 - 32 mmol/L Final   Glucose, Bld 11/14/2022 100 (H)  70 - 99 mg/dL Final   Glucose reference range applies only to samples taken after fasting for at least 8 hours.   BUN 11/14/2022 8  6 - 20 mg/dL Final   Creatinine, Ser 11/14/2022 0.96  0.61 - 1.24 mg/dL Final   Calcium 11/14/2022 8.6 (L)  8.9 - 10.3 mg/dL Final   Total Protein 11/14/2022 8.7 (H)  6.5 - 8.1 g/dL Final   Albumin 11/14/2022 3.4 (L)  3.5 - 5.0 g/dL Final   AST 11/14/2022 34  15 - 41 U/L Final  ALT 11/14/2022 30  0 - 44 U/L Final   Alkaline Phosphatase 11/14/2022 49  38 - 126 U/L Final   Total Bilirubin 11/14/2022 0.9  0.3 - 1.2 mg/dL Final   GFR, Estimated 11/14/2022 >60  >60 mL/min Final   Comment: (NOTE) Calculated using the CKD-EPI Creatinine Equation (2021)    Anion gap 11/14/2022 12  5 - 15 Final   Performed at [facility], [place] [address] [place], [place] [id number]   Salicylate Lvl 99991111 <7.0 (L)  7.0 - 30.0 mg/dL Final   Performed at [place] [address], [place], [place] [address]   Acetaminophen (Tylenol), Serum 11/14/2022 <10 (L)  10 - 30 ug/mL Final   Comment: (NOTE) Therapeutic concentrations vary significantly. A range of 10-30 ug/mL  may be an effective concentration for many patients. However, some  are best treated at concentrations outside of this range. Acetaminophen concentrations >150 ug/mL at 4 hours after ingestion  and >50 ug/mL at 12 hours after ingestion are often associated with  toxic reactions.  Performed at [facility], [place] [address], [place], [place] [address]    Alcohol, Ethyl (B) 11/14/2022 133 (H)  <10 mg/dL Final   Comment: (NOTE) Lowest  detectable limit for serum alcohol is 10 mg/dL.  For medical purposes only. Performed at [facility], [place] [address] [place], [place] [address]    WBC 11/14/2022 3.6 (L)  4.0 - 10.5 K/uL Final   RBC 11/14/2022 4.19 (L)  4.22 - 5.81 MIL/uL Final   Hemoglobin 11/14/2022 13.1  13.0 - 17.0 g/dL Final   HCT 11/14/2022 39.2  39.0 - 52.0 % Final   MCV 11/14/2022 93.6  80.0 - 100.0 fL Final   MCH 11/14/2022 31.3  26.0 - 34.0 pg Final   MCHC 11/14/2022 33.4  30.0 - 36.0 g/dL Final   RDW 11/14/2022 14.3  11.5 - 15.5 % Final   Platelets 11/14/2022 245  150 - 400 K/uL Final   nRBC 11/14/2022 0.0  0.0 - 0.2 % Final   Neutrophils Relative % 11/14/2022 49  % Final   Neutro Abs 11/14/2022 1.7  1.7 - 7.7 K/uL Final   Lymphocytes Relative 11/14/2022 38  % Final   Lymphs Abs 11/14/2022 1.3  0.7 - 4.0 K/uL Final   Monocytes Relative 11/14/2022 10  % Final   Monocytes Absolute 11/14/2022 0.4  0.1 - 1.0 K/uL Final   Eosinophils Relative 11/14/2022 2  % Final   Eosinophils Absolute 11/14/2022 0.1  0.0 - 0.5 K/uL Final   Basophils Relative 11/14/2022 1  % Final   Basophils Absolute 11/14/2022 0.0  0.0 - 0.1 K/uL Final   Immature Granulocytes 11/14/2022 0  % Final   Abs Immature Granulocytes 11/14/2022 0.01  0.00 - 0.07 K/uL Final   Performed at [facility], [place] [address], [place], [place] [address]  Admission on 09/12/2022, Discharged on 09/13/2022  Component Date Value Ref Range Status   WBC 09/12/2022 3.8 (L)  4.0 - 10.5 K/uL Final   RBC 09/12/2022 4.25  4.22 - 5.81 MIL/uL Final   Hemoglobin 09/12/2022 13.4  13.0 - 17.0 g/dL Final   HCT 09/12/2022 40.1  39.0 - 52.0 % Final   MCV 09/12/2022 94.4  80.0 - 100.0 fL Final   MCH 09/12/2022 31.5  26.0 - 34.0 pg Final   MCHC 09/12/2022 33.4  30.0 - 36.0 g/dL Final   RDW 09/12/2022 14.2  11.5 - 15.5 % Final  Platelets 09/12/2022 295  150 - 400 K/uL Final   nRBC 09/12/2022 0.0  0.0 - 0.2 % Final   Neutrophils Relative % 09/12/2022 50  %  Final   Neutro Abs 09/12/2022 1.9  1.7 - 7.7 K/uL Final   Lymphocytes Relative 09/12/2022 37  % Final   Lymphs Abs 09/12/2022 1.4  0.7 - 4.0 K/uL Final   Monocytes Relative 09/12/2022 10  % Final   Monocytes Absolute 09/12/2022 0.4  0.1 - 1.0 K/uL Final   Eosinophils Relative 09/12/2022 2  % Final   Eosinophils Absolute 09/12/2022 0.1  0.0 - 0.5 K/uL Final   Basophils Relative 09/12/2022 1  % Final   Basophils Absolute 09/12/2022 0.0  0.0 - 0.1 K/uL Final   Immature Granulocytes 09/12/2022 0  % Final   Abs Immature Granulocytes 09/12/2022 0.01  0.00 - 0.07 K/uL Final   Reactive, Benign Lymphocytes 09/12/2022 PRESENT   Final   Ovalocytes 09/12/2022 PRESENT   Final   Stomatocytes 09/12/2022 PRESENT   Final   Platelet Morphology 09/12/2022 NORMAL   Final   Performed at [facility], [place] [address], [place], [place] [address]   Sodium 09/12/2022 135  135 - 145 mmol/L Final   Potassium 09/12/2022 3.2 (L)  3.5 - 5.1 mmol/L Final   Chloride 09/12/2022 103  98 - 111 mmol/L Final   CO2 09/12/2022 21 (L)  22 - 32 mmol/L Final   Glucose, Bld 09/12/2022 92  70 - 99 mg/dL Final   Glucose reference range applies only to samples taken after fasting for at least 8 hours.   BUN 09/12/2022 7  6 - 20 mg/dL Final   Creatinine, Ser 09/12/2022 0.90  0.61 - 1.24 mg/dL Final   Calcium 09/12/2022 8.6 (L)  8.9 - 10.3 mg/dL Final   GFR, Estimated 09/12/2022 >60  >60 mL/min Final   Comment: (NOTE) Calculated using the CKD-EPI Creatinine Equation (2021)    Anion gap 09/12/2022 11  5 - 15 Final   Performed at [facility], [place] [address], [place], [place] [address]   Lactic Acid, Venous 09/12/2022 2.2 (HH)  0.5 - 1.9 mmol/L Final   Comment: CRITICAL RESULT CALLED TO, READ BACK BY AND VERIFIED WITH: COLES,L AT 0026 ON 09/13/22 BY LUZOLOP Performed at [facility], [place] [address], [place], [place] [address]    Specimen Description 09/12/2022    Final                    Value:BLOOD BLOOD RIGHT HAND Performed at [facility], [place] [address] [place], [place] [address]    Special Requests 09/12/2022    Final                   Value:BOTTLES DRAWN AEROBIC AND ANAEROBIC Blood Culture results may not be optimal due to an inadequate volume of blood received in culture bottles Performed at [facility], [place] [address], [place], [place] [address]    Culture 09/12/2022    Final                   Value:NO GROWTH 5 DAYS Performed at [place] [address] [place], [place] [address]    Report Status 09/12/2022 09/18/2022 FINAL   Final   Prothrombin Time 09/13/2022 13.5  11.4 - 15.2 seconds Final   INR 09/13/2022 1.0  0.8 - 1.2 Final   Comment: (NOTE) INR goal varies based on  device and disease states. Performed at [facility], [place] [address], [place], [place] [address]    Lactic Acid, Venous 09/13/2022 3.1 (HH)  0.5 - 1.9 mmol/L Final   Comment: CRITICAL RESULT CALLED TO, READ BACK BY AND VERIFIED WITH [place],J AT 0529 ON 09/13/22 BY LUZOLOP Performed at [facility], [place] [address] [place], [place] [address]    Lactic Acid, Venous 09/13/2022 1.6  0.5 - 1.9 mmol/L Final   Performed at [place] [address] [place], [place] [address]  There may be more visits with results that are not included.    Allergies: Bee venom, Lisinopril, and Sulfamethoxazole-trimethoprim  Medications: [place] protocol and ativan detox protocol     Medical Decision Making   William Powers is a 42 y/o male with a history of polysubstance abuse, adjustment disorder with depressed mood, alcohol use disorder and suicidal ideation presenting to [facility] via [facility] for suicidal ideations.  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  Patient reports that he has been having suicidal ideations and homicidal ideations and that he would rather hurt himself and than hurt anyone else.  Patient is not able to contract for safety and will be admitted to [facility] continuous observation.    Recommendations  Based on my evaluation the patient does not appear to have an emergency medical condition. Patient will be admitted to [facility] continuous observation for crisis management, safety and stabilization.  Lucia Bitter, NP 12/22/22  5:42 AM

## 2022-12-24 ENCOUNTER — Encounter (HOSPITAL_COMMUNITY): Payer: Self-pay

## 2022-12-24 ENCOUNTER — Emergency Department (HOSPITAL_COMMUNITY): Payer: No Typology Code available for payment source

## 2022-12-24 ENCOUNTER — Emergency Department (HOSPITAL_COMMUNITY)
Admission: EM | Admit: 2022-12-24 | Discharge: 2022-12-24 | Disposition: A | Discharge status: Home / Self Care | Payer: No Typology Code available for payment source | Attending: Emergency Medicine | Admitting: Emergency Medicine

## 2022-12-24 DIAGNOSIS — F10920 Alcohol use, unspecified with intoxication, uncomplicated: Secondary | ICD-10-CM

## 2022-12-24 DIAGNOSIS — R1031 Right lower quadrant pain: Secondary | ICD-10-CM | POA: Diagnosis not present

## 2022-12-24 DIAGNOSIS — F10220 Alcohol dependence with intoxication, uncomplicated: Secondary | ICD-10-CM | POA: Diagnosis not present

## 2022-12-24 DIAGNOSIS — R002 Palpitations: Secondary | ICD-10-CM | POA: Diagnosis not present

## 2022-12-24 DIAGNOSIS — R079 Chest pain, unspecified: Secondary | ICD-10-CM | POA: Diagnosis present

## 2022-12-24 DIAGNOSIS — I509 Heart failure, unspecified: Secondary | ICD-10-CM | POA: Insufficient documentation

## 2022-12-24 DIAGNOSIS — Y904 Blood alcohol level of 80-99 mg/100 ml: Secondary | ICD-10-CM | POA: Diagnosis not present

## 2022-12-24 LAB — CBC
HCT: 38.8 % — ABNORMAL LOW (ref 39.0–52.0)
Hemoglobin: 12.8 g/dL — ABNORMAL LOW (ref 13.0–17.0)
MCH: 30.9 pg (ref 26.0–34.0)
MCHC: 33 g/dL (ref 30.0–36.0)
MCV: 93.7 fL (ref 80.0–100.0)
Platelets: 296 10*3/uL (ref 150–400)
RBC: 4.14 MIL/uL — ABNORMAL LOW (ref 4.22–5.81)
RDW: 14.6 % (ref 11.5–15.5)
WBC: 3 10*3/uL — ABNORMAL LOW (ref 4.0–10.5)
nRBC: 0 % (ref 0.0–0.2)

## 2022-12-24 LAB — LIPASE, BLOOD: Lipase: 35 U/L (ref 11–51)

## 2022-12-24 LAB — RAPID URINE DRUG SCREEN, HOSP PERFORMED
Amphetamines: NOT DETECTED
Barbiturates: NOT DETECTED
Benzodiazepines: NOT DETECTED
Cocaine: NOT DETECTED
Opiates: NOT DETECTED
Tetrahydrocannabinol: POSITIVE — AB

## 2022-12-24 LAB — BASIC METABOLIC PANEL
Anion gap: 9 (ref 5–15)
BUN: 19 mg/dL (ref 6–20)
CO2: 19 mmol/L — ABNORMAL LOW (ref 22–32)
Calcium: 8.1 mg/dL — ABNORMAL LOW (ref 8.9–10.3)
Chloride: 108 mmol/L (ref 98–111)
Creatinine, Ser: 1.01 mg/dL (ref 0.61–1.24)
GFR, Estimated: >60 mL/min (ref >60)
Glucose, Bld: 83 mg/dL (ref 70–99)
Potassium: 3.6 mmol/L (ref 3.5–5.1)
Sodium: 136 mmol/L (ref 135–145)

## 2022-12-24 LAB — ETHANOL: Alcohol, Ethyl (B): 94 mg/dL — ABNORMAL HIGH (ref <10)

## 2022-12-24 LAB — TROPONIN I (HIGH SENSITIVITY)
Troponin I (High Sensitivity): 6 ng/L (ref <18)
Troponin I (High Sensitivity): 6 ng/L (ref <18)

## 2022-12-24 LAB — D-DIMER, QUANTITATIVE: D-Dimer, Quant: <0.27 ug/mL-FEU (ref 0.00–0.50)

## 2022-12-24 MED ORDER — SODIUM CHLORIDE (PF) 0.9 % IJ SOLN
INTRAMUSCULAR | Status: AC
  Filled 2022-12-24: qty 50

## 2022-12-24 MED ORDER — IOHEXOL 300 MG/ML  SOLN
100.0000 mL | Freq: Once | INTRAMUSCULAR | Status: AC | PRN
  Administered 2022-12-24: 100 mL via INTRAVENOUS

## 2022-12-24 NOTE — ED Provider Notes (Addendum)
  Physical Exam  BP 117/81   Pulse 97   Temp 98.2 F (36.8 C) (Oral)   Resp (!) 21   SpO2 97%   Physical Exam  Procedures  Procedures  ED Course / MDM    Medical Decision Making Amount and/or Complexity of Data Reviewed Labs: ordered. Radiology: ordered.  Risk Prescription drug management.   Pt comes in with cc of atypical CP and noted to have lower quadrant abd pain. CT ordered. Trops ordered.  If workup neg, pt can be discharged. Has alcoholism hx. No si, hi.    Varney Biles, MD 12/24/22 0740   9:32 AM Reassessed after CT scan. Patient already had breakfast. CT scan shows evidence of enteritis, questionable cystitis and abdominal wall thickening in the right lower quadrant.  Patient informs me that he is having no significant abdominal pain.  On exam, I assessed his right lower quadrant including the right groin.  There is no evidence of any abdominal wall cellulitis, infection.  Patient states that he has no pain when he is resting.  He states that he feels bloated.  He denies any vomiting or diarrhea.  Clinically does not have bacterial colitis that needs antibiotics.  Clinically does not appear that patient has acute cystitis as he denies any burning with urination or urinary frequency and denies any abdominal discomfort with urination.  Return precautions discussed.  Patient is comfortable going home.  No indication for antibiotics and additional workup.  Troponins are negative.    Varney Biles, MD 12/24/22 [phone]

## 2022-12-24 NOTE — ED Provider Notes (Signed)
[facility] Provider Note   CSN: [id number] Arrival date & time: 12/24/22  [id number]     History  Chief Complaint  Patient presents with   Alcohol Problem   Chest Pain    William Powers is a 42 y.o. male.  Patient concerned he could have alcohol poisoning.  States he drinks 4 pints of vodka every day last drink 2 hours ago.  Denies any tremors or shakes denies any history of alcohol withdrawal seizures.  He is also complaining of intermittent palpitations to the left side of his chest that come and go.  States intermittently over the past 3 days has had sensation of irregular heartbeat with palpitations and racing heart feeling to his left chest.  Symptoms last about 10 minutes and then subside.  Happens 2-3 times a day. denies chest pain, shortness of breath, nausea, vomiting, diaphoresis.  Also having right lower quadrant abdominal pain ongoing for the past several days as well.  No vomiting or diarrhea or constipation.  No pain with urination or blood in the urine.  States he has a history of heart failure but does not take any medications.  Denies any history of CAD or stents.    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  He states he takes no medications chronically.  Denies any other drug use.  Just alcohol.  Denies any fevers, chills  The history is provided by the patient.  Alcohol Problem Associated symptoms include chest pain and abdominal pain. Pertinent negatives include no headaches and no shortness of breath.  Chest Pain Associated symptoms: abdominal pain   Associated symptoms: no fever, no headache, no nausea, no shortness of breath, no vomiting and no weakness    Most recent echocardiogram with improvement in EF to 55 to 60%.    Home Medications Prior to Admission medications   Medication Sig Start Date End Date Taking? Authorizing Provider  Potassium Chloride ER 20 MEQ  TBCR Take 20 mEq by mouth daily at 6 (six) AM. Patient not taking: No sig reported 08/07/21 09/05/21  William Milan, MD      Allergies    Bee venom, Lisinopril, and Sulfamethoxazole-trimethoprim    Review of Systems   Review of Systems  Constitutional:  Negative for activity change, appetite change and fever.  HENT:  Negative for congestion.   Respiratory:  Positive for chest tightness. Negative for shortness of breath.   Cardiovascular:  Positive for chest pain.  Gastrointestinal:  Positive for abdominal pain. Negative for nausea and vomiting.  Genitourinary:  Negative for dysuria and hematuria.  Musculoskeletal:  Negative for arthralgias and myalgias.  Skin:  Negative for rash.  Neurological:  Negative for weakness and headaches.   all other systems are negative except as noted in the HPI and PMH.    Physical Exam Updated Vital Signs BP 119/72   Pulse (!) 107   Temp 98.2 F (36.8 C) (Oral)   Resp 18   SpO2 98%  Physical Exam Vitals and nursing note reviewed.  Constitutional:      General: He is not in acute distress.    Appearance: Normal appearance. He is well-developed and normal weight. He is not ill-appearing.  HENT:     Head: Normocephalic and atraumatic.     Mouth/Throat:     Pharynx: No oropharyngeal exudate.  Eyes:     Conjunctiva/sclera: Conjunctivae normal.     Pupils: Pupils are equal, round, and reactive  to light.  Neck:     Comments: No meningismus. Cardiovascular:     Rate and Rhythm: Normal rate and regular rhythm.     Heart sounds: Normal heart sounds. No murmur heard. Pulmonary:     Effort: Pulmonary effort is normal. No respiratory distress.     Breath sounds: Normal breath sounds.  Abdominal:     Palpations: Abdomen is soft.     Tenderness: There is abdominal tenderness. There is no guarding or rebound.     Comments: Right lower quadrant tenderness, no guarding or rebound  Musculoskeletal:        General: No tenderness. Normal range of  motion.     Cervical back: Normal range of motion and neck supple.  Skin:    General: Skin is warm.  Neurological:     Mental Status: He is alert and oriented to person, place, and time.     Cranial Nerves: No cranial nerve deficit.     Motor: No abnormal muscle tone.     Coordination: Coordination normal.     Comments:  5/5 strength throughout. CN 2-12 intact.Equal grip strength.   Psychiatric:        Behavior: Behavior normal.     ED Results / Procedures / Treatments   Labs (all labs ordered are listed, but only abnormal results are displayed) Labs Reviewed  BASIC METABOLIC PANEL - Abnormal; Notable for the following components:      Result Value   CO2 19 (*)    Calcium 8.1 (*)    All other components within normal limits  CBC - Abnormal; Notable for the following components:   WBC 3.0 (*)    RBC 4.14 (*)    Hemoglobin 12.8 (*)    HCT 38.8 (*)    All other components within normal limits  ETHANOL - Abnormal; Notable for the following components:   Alcohol, Ethyl (B) 94 (*)    All other components within normal limits  LIPASE, BLOOD  D-DIMER, QUANTITATIVE  RAPID URINE DRUG SCREEN, HOSP PERFORMED  TROPONIN I (HIGH SENSITIVITY)  TROPONIN I (HIGH SENSITIVITY)    EKG EKG Interpretation  Date/Time:  Wednesday December 24 2022 04:55:52 EST Ventricular Rate:  108 PR Interval:  159 QRS Duration: 94 QT Interval:  337 QTC Calculation: 452 R Axis:   57 Text Interpretation: Sinus tachycardia RSR' in V1 or V2, probably normal variant Rate faster Confirmed by William Powers [phone]) on 12/24/2022 5:04:16 AM  Radiology CT ABDOMEN PELVIS W CONTRAST  Result Date: 12/24/2022 CLINICAL DATA:  Right lower quadrant pain EXAM: CT ABDOMEN AND PELVIS WITH CONTRAST TECHNIQUE: Multidetector CT imaging of the abdomen and pelvis was performed using the standard protocol following bolus administration of intravenous contrast. RADIATION DOSE REDUCTION: This exam was performed according to the  departmental dose-optimization program which includes automated exposure control, adjustment of the mA and/or kV according to patient size and/or use of iterative reconstruction technique. CONTRAST:  169m OMNIPAQUE IOHEXOL 300 MG/ML  SOLN COMPARISON:  CT abdomen and pelvis 09/14/2021 FINDINGS: Lower chest: Bibasilar atelectasis. Hepatobiliary: Liver has a normal contour. No focal hepatic lesions are visualized. No evidence of intra or extrahepatic biliary ductal dilatation. Portal vein and hepatic veins are contrast opacified. Evidence of cholelithiasis or cholecystitis. Pancreas: No evidence of peripancreatic fat stranding to suggest pancreatitis. No evidence of pancreatic ductal dilatation. Spleen: Spleen is normal in size without focal lesions. Adrenals/Urinary Tract: Bilateral adrenal glands are without focal lesions and normal in appearance. Bilateral kidneys are normal in size.  There is no evidence of nephrolithiasis or hydronephrosis. There is chronic scarring at the lower pole of the left kidney. No focal renal lesions are visualized. Urinary bladder is circumferentially thickened, nonspecific. Stomach/Bowel: No evidence of bowel obstruction. There is diverticulosis without evidence of diverticulitis. Compared to prior exam there is mild thickening of the wall of the ascending colon to the level of the hepatic flexure (series 5, images 56-30). There is no evidence of pneumatosis. No pneumoperitoneum. The appendix is slightly dilated measuring up to 1.2 cm in the greatest axial dimension. However the appendix air-filled and thin-walled and without evidence of surrounding fat stranding to suggest acute appendicitis. Vascular/Lymphatic: No significant vascular findings are present. There are prominent bilateral external iliac chain lymph nodes measuring up to 0.8 cm on the right. There is also a prominent left inguinal lymph node measuring up to 0.8 cm, new from prior exam. Reproductive: Prostate is  unremarkable. Other: There are bilateral fat containing inguinal hernias. There is persistent mild fat stranding in the subcutaneous soft tissues overlying the right lower quadrant, at the level of the hip joint (series 2, image 86), nonspecific. Musculoskeletal: No fracture is seen. IMPRESSION: 1. Mild thickening of the wall of the ascending colon to the level of the hepatic flexure, which can be seen in the setting of infectious or inflammatory colitis. 2. The appendix is slightly dilated measuring up to 1.2 cm in the greatest axial dimension. However the appendix is air-filled and thin-walled and without evidence of surrounding fat stranding to suggest acute appendicitis. 3. Prominent bilateral external iliac chain lymph nodes and left inguinal lymph node, likely reactive. 4. Circumferentially thickened urinary bladder, nonspecific, but can be seen in the setting of cystitis. Correlate with urinalysis. 5. Persistent mild fat stranding in the subcutaneous soft tissues overlying the right lower quadrant, at the level of the inguinal canal. Correlate for point tenderness. Electronically Signed   By: William Powers M.D.   On: 12/24/2022 07:49   DG Chest 2 View  Result Date: 12/24/2022 CLINICAL DATA:  42 year old male with history of chest pain and shortness of breath. EXAM: CHEST - 2 VIEW COMPARISON:  Chest x-ray 12/11/2022. FINDINGS: Lung volumes are low. No consolidative airspace disease. No pleural effusions. No pneumothorax. No pulmonary nodule or mass noted. Pulmonary vasculature and the cardiomediastinal silhouette are within normal limits. IMPRESSION: 1. Low lung volumes without radiographic evidence of acute cardiopulmonary disease. Electronically Signed   By: William Powers M.D.   On: 12/24/2022 06:30    Procedures Procedures    Medications Ordered in ED Medications - No data to display  ED Course/ Medical Decision Making/ A&P                             Medical Decision Making Amount  and/or Complexity of Data Reviewed Labs: ordered. Decision-making details documented in ED Course. Radiology: ordered and independent interpretation performed. Decision-making details documented in ED Course. ECG/medicine tests: ordered and independent interpretation performed. Decision-making details documented in ED Course.  Risk Prescription drug management.   Palpitations intermittently for the past 3 days with in setting of heavy alcohol intake.  No chest pain or shortness of breath.  EKG shows sinus rhythm with diffuse J-point elevation similar to previous. EF recovered on echo in 2019. Hx PE, not on anticoagulation. Low suspicion for acute PE today.  Chest x-ray negative for rib fracture or pneumothorax.  Troponin negative.  D-dimer negative.  No chest pain or  shortness of breath.  Low suspicion for ACS.  Patient will be allowed to sober.  CT to be obtained given his right lower quadrant pain.  He is having palpitations but no chest pain or shortness of breath.  Will check serial troponins.  Care to be transferred at shift change pending repeat troponin as well as CT abdomen and pelvis.  Dr. Kathrynn Powers to assume care. Second troponin pending. CT pending to evaluate for appendicitis.         Final Clinical Impression(s) / ED Diagnoses Final diagnoses:  Palpitation  Alcoholic intoxication without complication [facility])    Rx / DC Orders ED Discharge Orders     None         Jaiven Graveline, Annie Main, MD 12/24/22 [phone]

## 2022-12-24 NOTE — Discharge Instructions (Addendum)
There is no evidence of heart attack.  Reduce your alcohol intake and follow-up with your primary doctor and cardiologist.  Return to the ED with new or worsening symptoms.

## 2022-12-24 NOTE — ED Notes (Signed)
Pt has been given a [place] sandwich and plenty of fluids to drink Pt has been reminded several times a urine sample is needed

## 2022-12-24 NOTE — ED Notes (Signed)
Pt asked again about urine specimen. Pt states " I can't pee right now." RN made aware.

## 2022-12-24 NOTE — ED Notes (Signed)
Pt given urinal and made aware of need for urine specimen 

## 2022-12-24 NOTE — ED Notes (Signed)
Pt woken up and reminded of need for urine specimen

## 2022-12-24 NOTE — ED Triage Notes (Signed)
Pt states that he wants to makes sure he does not have ETOH poisoning, reports he drinks 2 pints a day, last drink a few hours ago. Denies SI/HI, pt states that he has also been having CP for the past 3 days.

## 2022-12-29 ENCOUNTER — Emergency Department (HOSPITAL_COMMUNITY)
Admission: EM | Admit: 2022-12-29 | Discharge: 2022-12-29 | Disposition: A | Discharge status: Home / Self Care | Payer: No Typology Code available for payment source | Attending: Emergency Medicine | Admitting: Emergency Medicine

## 2022-12-29 DIAGNOSIS — Z1152 Encounter for screening for COVID-19: Secondary | ICD-10-CM | POA: Diagnosis not present

## 2022-12-29 DIAGNOSIS — R030 Elevated blood-pressure reading, without diagnosis of hypertension: Secondary | ICD-10-CM | POA: Insufficient documentation

## 2022-12-29 DIAGNOSIS — D72819 Decreased white blood cell count, unspecified: Secondary | ICD-10-CM | POA: Insufficient documentation

## 2022-12-29 DIAGNOSIS — F1014 Alcohol abuse with alcohol-induced mood disorder: Secondary | ICD-10-CM | POA: Diagnosis not present

## 2022-12-29 DIAGNOSIS — R45851 Suicidal ideations: Secondary | ICD-10-CM | POA: Diagnosis not present

## 2022-12-29 DIAGNOSIS — E876 Hypokalemia: Secondary | ICD-10-CM | POA: Diagnosis not present

## 2022-12-29 DIAGNOSIS — Z21 Asymptomatic human immunodeficiency virus [HIV] infection status: Secondary | ICD-10-CM | POA: Diagnosis not present

## 2022-12-29 DIAGNOSIS — F10129 Alcohol abuse with intoxication, unspecified: Secondary | ICD-10-CM | POA: Insufficient documentation

## 2022-12-29 DIAGNOSIS — F329 Major depressive disorder, single episode, unspecified: Secondary | ICD-10-CM | POA: Insufficient documentation

## 2022-12-29 DIAGNOSIS — F10920 Alcohol use, unspecified with intoxication, uncomplicated: Secondary | ICD-10-CM

## 2022-12-29 DIAGNOSIS — F1020 Alcohol dependence, uncomplicated: Secondary | ICD-10-CM

## 2022-12-29 DIAGNOSIS — F10288 Alcohol dependence with other alcohol-induced disorder: Secondary | ICD-10-CM | POA: Insufficient documentation

## 2022-12-29 LAB — COMPREHENSIVE METABOLIC PANEL
ALT: 28 U/L (ref 0–44)
AST: 33 U/L (ref 15–41)
Albumin: 4 g/dL (ref 3.5–5.0)
Alkaline Phosphatase: 52 U/L (ref 38–126)
Anion gap: 9 (ref 5–15)
BUN: 8 mg/dL (ref 6–20)
CO2: 21 mmol/L — ABNORMAL LOW (ref 22–32)
Calcium: 8.5 mg/dL — ABNORMAL LOW (ref 8.9–10.3)
Chloride: 104 mmol/L (ref 98–111)
Creatinine, Ser: 1.01 mg/dL (ref 0.61–1.24)
GFR, Estimated: >60 mL/min (ref >60)
Glucose, Bld: 105 mg/dL — ABNORMAL HIGH (ref 70–99)
Potassium: 3.3 mmol/L — ABNORMAL LOW (ref 3.5–5.1)
Sodium: 134 mmol/L — ABNORMAL LOW (ref 135–145)
Total Bilirubin: 0.8 mg/dL (ref 0.3–1.2)
Total Protein: 9.1 g/dL — ABNORMAL HIGH (ref 6.5–8.1)

## 2022-12-29 LAB — CBC WITH DIFFERENTIAL/PLATELET
Abs Immature Granulocytes: 0.02 10*3/uL (ref 0.00–0.07)
Basophils Absolute: 0 10*3/uL (ref 0.0–0.1)
Basophils Relative: 1 %
Eosinophils Absolute: 0.1 10*3/uL (ref 0.0–0.5)
Eosinophils Relative: 3 %
HCT: 40.8 % (ref 39.0–52.0)
Hemoglobin: 13.8 g/dL (ref 13.0–17.0)
Immature Granulocytes: 1 %
Lymphocytes Relative: 35 %
Lymphs Abs: 1.1 10*3/uL (ref 0.7–4.0)
MCH: 31.3 pg (ref 26.0–34.0)
MCHC: 33.8 g/dL (ref 30.0–36.0)
MCV: 92.5 fL (ref 80.0–100.0)
Monocytes Absolute: 0.3 10*3/uL (ref 0.1–1.0)
Monocytes Relative: 10 %
Neutro Abs: 1.6 10*3/uL — ABNORMAL LOW (ref 1.7–7.7)
Neutrophils Relative %: 50 %
Platelets: 300 10*3/uL (ref 150–400)
RBC: 4.41 MIL/uL (ref 4.22–5.81)
RDW: 14.6 % (ref 11.5–15.5)
WBC: 3.2 10*3/uL — ABNORMAL LOW (ref 4.0–10.5)
nRBC: 0 % (ref 0.0–0.2)

## 2022-12-29 LAB — RESP PANEL BY RT-PCR (RSV, FLU A&B, COVID)  RVPGX2
Influenza A by PCR: NEGATIVE
Influenza B by PCR: NEGATIVE
Resp Syncytial Virus by PCR: NEGATIVE
SARS Coronavirus 2 by RT PCR: NEGATIVE

## 2022-12-29 LAB — ETHANOL: Alcohol, Ethyl (B): 113 mg/dL — ABNORMAL HIGH (ref <10)

## 2022-12-29 MED ORDER — POTASSIUM CHLORIDE CRYS ER 20 MEQ PO TBCR
40.0000 meq | EXTENDED_RELEASE_TABLET | Freq: Once | ORAL | Status: AC
  Administered 2022-12-29: 40 meq via ORAL
  Filled 2022-12-29: qty 2

## 2022-12-29 NOTE — ED Notes (Addendum)
Shoes, Shirt, Pants, [place], Phone and Hat in the bags. 2 Bags w/ labels in cabinet behind nurses station.

## 2022-12-29 NOTE — ED Notes (Signed)
Per L.D. Toney Rakes, [facility]: Quintella Reichert, NP, has psych cleared. Patient will follow up with [facility] for therapy and medication management. EDP notified.

## 2022-12-29 NOTE — ED Triage Notes (Signed)
Pt got baptized today then went gambling and lost $9000. Now suicidal.

## 2022-12-29 NOTE — Discharge Instructions (Addendum)
It was our pleasure to provide your ER care today - we hope that you feel better.  Avoid alcohol use as it is harmful to your physical health and mental well-being. See resource guide attached in terms of accessing inpatient or outpatient substance use treatment programs.   Follow up closely with primary care doctor and behavioral health provider in the coming week.  For mental health issues and/or crisis, you may also go directly to the [facility] - they are open 24/7 and walk-ins are welcome.   From your lab tests, your potassium level is mildly low  - eat plenty of fruits and vegetables, and follow up closely with primary care doctor in 1-2 weeks.  Your blood pressure is high today - follow up with primary care doctor in 1-2 weeks.   Return to ER if worse, new symptoms, fevers, chest pain, trouble breathing, or other emergency concern.

## 2022-12-29 NOTE — ED Provider Notes (Signed)
[place] EMERGENCY DEPARTMENT AT [facility] Provider Note   CSN: [id number] Arrival date & time: 12/29/22  [id number]     History  Chief Complaint  Patient presents with   Psychiatric Evaluation    William Powers is a 42 y.o. male.  42 year old male presents to the emergency department requesting psychiatric evaluation.  States that he "cannot be left alone tonight".  States he was baptized in the morning and subsequently went gambling and lost $9000.  He was last evaluated by psychiatry in [facility].  Per chart review, has had frequent ED visits for suicidal ideations.  Does not express any active plan.  The history is provided by the patient. No language interpreter was used.       Home Medications Prior to Admission medications   Medication Sig Start Date End Date Taking? Authorizing Provider  Potassium Chloride ER 20 MEQ TBCR Take 20 mEq by mouth daily at 6 (six) AM. Patient not taking: No sig reported 08/07/21 09/05/21  Reubin Milan, MD      Allergies    Bee venom, Lisinopril, and Sulfamethoxazole-trimethoprim    Review of Systems   Review of Systems Ten systems reviewed and are negative for acute change, except as noted in the HPI.    Physical Exam Updated Vital Signs BP (!) 143/98 (BP Location: Right Arm)   Pulse (!) 106   Temp 98 F (36.7 C) (Oral)   Resp 18   SpO2 97%   Physical Exam Vitals and nursing note reviewed.  Constitutional:      General: He is not in acute distress.    Appearance: He is well-developed. He is not diaphoretic.     Comments: Nontoxic appearing, NAD  HENT:     Head: Normocephalic and atraumatic.  Eyes:     General: No scleral icterus.    Conjunctiva/sclera: Conjunctivae normal.  Pulmonary:     Effort: Pulmonary effort is normal. No respiratory distress.     Comments: Respirations even and unlabored Musculoskeletal:        General: Normal range of motion.     Cervical back: Normal range of motion.   Skin:    General: Skin is warm and dry.     Coloration: Skin is not pale.     Findings: No erythema or rash.  Neurological:     Mental Status: He is alert and oriented to person, place, and time.     Coordination: Coordination normal.  Psychiatric:        Speech: Speech normal.        Behavior: Behavior is cooperative.        Thought Content: Thought content includes suicidal ideation. Thought content does not include suicidal plan.        Judgment: Judgment is impulsive.     ED Results / Procedures / Treatments   Labs (all labs ordered are listed, but only abnormal results are displayed) Labs Reviewed  COMPREHENSIVE METABOLIC PANEL - Abnormal; Notable for the following components:      Result Value   Sodium 134 (*)    Potassium 3.3 (*)    CO2 21 (*)    Glucose, Bld 105 (*)    Calcium 8.5 (*)    Total Protein 9.1 (*)    All other components within normal limits  ETHANOL - Abnormal; Notable for the following components:   Alcohol, Ethyl (B) 113 (*)    All other components within normal limits  CBC WITH DIFFERENTIAL/PLATELET - Abnormal; Notable  for the following components:   WBC 3.2 (*)    Neutro Abs 1.6 (*)    All other components within normal limits  RESP PANEL BY RT-PCR (RSV, FLU A&B, COVID)  [id number]  RAPID URINE DRUG SCREEN, HOSP PERFORMED    EKG EKG Interpretation  Date/Time:  Monday December 29 2022 03:03:37 EDT Ventricular Rate:  109 PR Interval:  157 QRS Duration: 95 QT Interval:  347 QTC Calculation: 468 R Axis:   74 Text Interpretation: Sinus tachycardia RSR' in V1 or V2, probably normal variant Borderline T wave abnormalities ST elev, probable normal early repol pattern When compared with ECG of 12/24/2022, No significant change was found Confirmed by Delora Fuel ([id number]) on 12/29/2022 3:10:01 AM  Radiology No results found.  Procedures Procedures    Medications Ordered in ED Medications - No data to display  ED Course/ Medical Decision Making/ A&P                              Medical Decision Making Amount and/or Complexity of Data Reviewed Labs: ordered.   This patient presents to the ED for concern of suicidal ideation, this involves an extensive number of treatment options, and is a complaint that carries with it a high risk of complications and morbidity.  The differential diagnosis includes MDD vs substance-induced mood disorder   Co morbidities that complicate the patient evaluation  HTN HIV Polysubstance abuse   Additional history obtained:  External records from outside source obtained and reviewed including psychiatric assessment at OSH on 12/25/32.   Lab Tests:  I Ordered, and personally interpreted labs.  The pertinent results include: Leukopenia 3.2 today.  This is unchanged, stable.  Mild hypokalemia of 3.3.  Ethanol 113.   Cardiac Monitoring:  The patient was maintained on a cardiac monitor.  I personally viewed and interpreted the cardiac monitored which showed an underlying rhythm of: NSR   Medicines ordered and prescription drug management:  I ordered medication including oral potassium for hypokalemia  Reevaluation of the patient after these medicines showed that the patient stayed the same I have reviewed the patients home medicines and have made adjustments as needed   Consultations Obtained:  I requested consultation with TTS; psychiatric assessment remains outstanding at shift change.   Problem List / ED Course:  As above   Reevaluation:  After the interventions noted above, I reevaluated the patient and found that they have :stayed the same   Social Determinants of Health:  Polysubstance abuse   Dispostion:  Patient medically cleared.  TTS consultation has been ordered and is outstanding at change of shift. Care to be assumed by oncoming ED provider.          Final Clinical Impression(s) / ED Diagnoses Final diagnoses:  Depression, unspecified depression type    Rx  / DC Orders ED Discharge Orders     None         Antonietta Breach, PA-C [id number]    Delora Fuel, MD [id number] [phone]

## 2022-12-29 NOTE — BH Assessment (Signed)
Comprehensive Clinical Assessment (CCA) Note  12/29/2022 William Powers [id number]  Disposition: Quintella Reichert, NP, psych cleared. Patient will follow up with [facility] for therapy and medication management.   The patient demonstrates the following risk factors for suicide: Chronic risk factors for suicide include: psychiatric disorder of depression and substance use disorder. Acute risk factors for suicide include: loss (financial, interpersonal, professional). Protective factors for this patient include: coping skills and hope for the future. Considering these factors, the overall suicide risk at this point appears to be low. Patient is appropriate for outpatient follow up.  William Powers is a 42 year old male presenting voluntary to [facility] due to [place] with no plan. Patient has a past psychiatric history of polysubstance abuse, adjustment disorder with depressed mood, alcohol use disorder and suicidal ideation.  Patient denied HI, psychosis and drug usage. Patient has had 15 ED visits in the last 6 months. Upon arrival patient ETOH was 113.  Patient reports SI with no plan for the past 1 day. Patient reports stressors/triggers of SI and depression includes getting baptized today then gambling and losing $9,000.00. Patient reports drinking 2 pints of alcohol daily with last use prior to arrival. Patient reported worsening depressive symptoms. Patient reported inpatient treatment approximate 1 year ago. Patient denied self-harming behaviors and suicide attempts. Patient reported 5 hours nightly sleep and poor appetite.   Patient was cooperative and unable to contract for safety. Patient reported main stressors/triggers include finances and women. Patient reported worsening depressive symptoms. Patient denied prior psych hospitalizations and no history of additional suicide attempts per patient.    Patient denied receiving outpatient therapy resources. Patient denied being  prescribed any psych medications. Patient reports living alone. Patient requesting outpatient therapy.    Chief Complaint:  Chief Complaint  Patient presents with   Psychiatric Evaluation   Visit Diagnosis:  Major depressive disorder   CCA Screening, Triage and Referral (STR)  Patient Reported Information How did you hear about [place]? Self  What Is the Reason for Your Visit/Call Today? SI with no plan.  How Long Has This Been Causing You Problems? <Week  What Do You Feel Would Help You the Most Today? Treatment for Depression or other mood problem   Have You Recently Had Any Thoughts About Hurting Yourself? Yes  Are You Planning to Commit Suicide/Harm Yourself At This time? No   Flowsheet Row ED from 12/29/2022 in [facility] Emergency Department at [facility] ED from 12/24/2022 in [facility] Emergency Department at [facility] ED from 12/21/2022 in [place] Moderate Risk Error: Q3, 4, or 5 should not be populated when Q2 is No Error: Q3, 4, or 5 should not be populated when Q2 is No       Have you Recently Had Thoughts About [place]? No  Are You Planning to Harm Someone at This Time? No  Explanation: n/a   Have You Used Any Alcohol or Drugs in the Past 24 Hours? Yes  What Did You Use and How Much? 2 pints daily and prior to arrival   Do You Currently Have a Therapist/Psychiatrist? No  Name of Therapist/Psychiatrist: Name of Therapist/Psychiatrist: n/a   Have You Been Recently Discharged From Any Office Practice or Programs? No  Explanation of Discharge From Practice/Program: n/a     CCA Screening Triage Referral Assessment Type of Contact: Face-to-Face  Telemedicine Service Delivery: Telemedicine service delivery: This service was provided via telemedicine  using a 2-way, interactive audio and video technology  Is this Initial or Reassessment? Is this Initial or Reassessment?:  Initial Assessment  Date Telepsych consult ordered in [facility]:  Date Telepsych consult ordered in [facility]: 12/29/22  Time Telepsych consult ordered in [facility]:  Time Telepsych consult ordered in [facility]: 0329  Location of Assessment: [facility] ED  Provider Location: [facility]   Collateral Involvement: none reported   Does Patient Have a [place]? No  Legal Guardian Contact Information: n/a  Copy of Legal Guardianship Form: -- (n/a)  Legal Guardian Notified of Arrival: -- (n/a)  Legal Guardian Notified of Pending Discharge: -- (n/a)  If Minor and Not Living with Parent(s), Who has Custody? n/a  Is CPS involved or ever been involved? Never  Is APS involved or ever been involved? Never   Patient Determined To Be At Risk for Harm To Self or Others Based on Review of Patient Reported Information or Presenting Complaint? Yes, for Self-Harm  Method: No Plan  Availability of Means: No access or NA  Intent: Vague intent or NA  Notification Required: No need or identified person  Additional Information for Danger to Others Potential: -- (denied)  Additional Comments for Danger to Others Potential: n/a  Are There Guns or Other Weapons in Your Home? No  Types of Guns/Weapons: n/a  Are These Weapons Safely Secured?                            -- (n/a)  Who Could Verify You Are Able To Have These Secured: n/a  Do You Have any Outstanding Charges, Pending Court Dates, Parole/Probation? none reported  Contacted To Inform of Risk of Harm To Self or Others: Other: Comment    Does Patient Present under Involuntary Commitment? No    [place] of Residence: [place]   Patient Currently Receiving the Following Services: Not Receiving Services   Determination of Need: Urgent (48 hours)   Options For Referral: Chemical Dependency Intensive Outpatient Therapy (CDIOP); Medication Management; Outpatient Therapy     CCA Biopsychosocial Patient Reported  Schizophrenia/Schizoaffective Diagnosis in Past: No   Strengths: self-awareness   Mental Health Symptoms Depression:   Hopelessness; Fatigue; Worthlessness; Change in energy/activity; Difficulty Concentrating; Tearfulness; Sleep (too much or little)   Duration of Depressive symptoms:  Duration of Depressive Symptoms: Less than two weeks   Mania:   Racing thoughts   Anxiety:    Tension; Worrying   Psychosis:   None   Duration of Psychotic symptoms:  Duration of Psychotic Symptoms: N/A   Trauma:   None   Obsessions:   None   Compulsions:   None   Inattention:   None   Hyperactivity/Impulsivity:   None   Oppositional/Defiant Behaviors:   None   Emotional Irregularity:   None   Other Mood/Personality Symptoms:   N/A    Mental Status Exam Appearance and self-care  Stature:   Tall   Weight:   Average weight   Clothing:   Casual   Grooming:   Normal   Cosmetic use:   None   Posture/gait:   Normal   Motor activity:   Not Remarkable   Sensorium  Attention:   Normal   Concentration:   Normal   Orientation:   X5   Recall/memory:   Normal   Affect and Mood  Affect:   Appropriate   Mood:   Depressed   Relating  Eye contact:  Normal   Facial expression:   Responsive   Attitude toward examiner:   Cooperative   Thought and Language  Speech flow:  Normal   Thought content:   Appropriate to Mood and Circumstances   Preoccupation:   None   Hallucinations:   Visual (shadows)   Organization:   Linear   Transport planner of Knowledge:   Average   Intelligence:   Average   Abstraction:   Normal   Judgement:   Impaired   Reality Testing:   Adequate   Insight:   Poor   Decision Making:   Impulsive   Social Functioning  Social Maturity:   Isolates   Social Judgement:   Normal   Stress  Stressors:   Museum/gallery curator; Relationship   Coping Ability:   Programme researcher, broadcasting/film/video Deficits:    Environmental health practitioner; Self-control   Supports:   Support needed     Religion: Religion/Spirituality Are You A Religious Person?: No How Might This Affect Treatment?: N/A  Leisure/Recreation: Leisure / Recreation Do You Have Hobbies?: No  Exercise/Diet: Exercise/Diet Do You Exercise?: No Have You Gained or Lost A Significant Amount of Weight in the Past Six Months?: No Do You Follow a Special Diet?: No Do You Have Any Trouble Sleeping?: Yes Explanation of Sleeping Difficulties: 5 hours nightly   CCA Employment/Education Employment/Work Situation: Employment / Work Situation Employment Situation: On disability Why is Patient on Disability: Heart failure How Long has Patient Been on Disability: since 12 Patient's Job has Been Impacted by Current Illness: No Has Patient ever Been in the [employer]?: No  Education: Education Is Patient Currently Attending School?: No Last Grade Completed: 10 Did You Attend College?: No Did You Have An Individualized Education Program (IIEP): No Did You Have Any Difficulty At School?: No Patient's Education Has Been Impacted by Current Illness: No   CCA Family/Childhood History Family and Relationship History: Family history Marital status: Divorced Divorced, when?: married for 3 months and then divorced What types of issues is patient dealing with in the relationship?: unsure Additional relationship information: n/a Does patient have children?: No  Childhood History:  Childhood History By whom was/is the patient raised?: Mother Did patient suffer any verbal/emotional/physical/sexual abuse as a child?: No Did patient suffer from severe childhood neglect?: No Has patient ever been sexually abused/assaulted/raped as an adolescent or adult?: No Was the patient ever a victim of a crime or a disaster?: No Witnessed domestic violence?: No Has patient been affected by domestic violence as an adult?: No       CCA Substance  Use Alcohol/Drug Use: Alcohol / Drug Use Pain Medications: See MAR Prescriptions: See MAR Over the Counter: See MAR History of alcohol / drug use?: Yes Longest period of sobriety (when/how long): Unknown Negative Consequences of Use:  (N/A) Withdrawal Symptoms: None Substance #1 Name of Substance 1: alcohol 1 - Age of First Use: teenager 1 - Amount (size/oz): 2 pints 1 - Frequency: daily 1 - Duration: same 1 - Last Use / Amount: prior to arrival to ED 1 - Method of Aquiring: purchase 1- Route of Use: oral                       ASAM's:  Six Dimensions of Multidimensional Assessment  Dimension 1:  Acute Intoxication and/or Withdrawal Potential:   Dimension 1:  Description of individual's past and current experiences of substance use and withdrawal: Pt denies, experiencing withdrawal symptoms.  Dimension 2:  Biomedical Conditions  and Complications:   Dimension 2:  Description of patient's biomedical conditions and  complications: Pt has heart failure, he's not linked to a Cardiologist.  Dimension 3:  Emotional, Behavioral, or Cognitive Conditions and Complications:  Dimension 3:  Description of emotional, behavioral, or cognitive conditions and complications: Pt is diagnosed with Alcohol use disorder, severe, dependence ([place]), Recurrent major depressive episodes, moderate ([place]).  Dimension 4:  Readiness to Change:  Dimension 4:  Description of Readiness to Change criteria: Pt reports, has tried getting help in the past and not successful.  Dimension 5:  Relapse, Continued use, or Continued Problem Potential:  Dimension 5:  Relapse, continued use, or continued problem potential critiera description: Pt reports, he uses everyday.  Dimension 6:  Recovery/Living Environment:  Dimension 6:  Recovery/Iiving environment criteria description: Pt is homeless and is back and forth from [place] to [place]. Pt denies, having supports.  ASAM Severity Score: ASAM's Severity Rating Score:  12  ASAM Recommended Level of Treatment: ASAM Recommended Level of Treatment: Level II Intensive Outpatient Treatment   Substance use Disorder (SUD) Substance Use Disorder (SUD)  Checklist Symptoms of Substance Use: Continued use despite having a persistent/recurrent physical/psychological problem caused/exacerbated by use, Continued use despite persistent or recurrent social, interpersonal problems, caused or exacerbated by use, Evidence of tolerance  Recommendations for Services/Supports/Treatments: Recommendations for Services/Supports/Treatments Recommendations For Services/Supports/Treatments: Residential-Level 1, Peer Support Services, Partial Hospitalization, CST Marine scientist), Other (Comment), Detox (Rehab program.)  Discharge Disposition:    DSM5 Diagnoses: Patient Active Problem List   Diagnosis Date Noted   Dental caries 09/13/2022   Substance abuse ([place]) 05/03/2022   Polysubstance abuse ([place]) 04/29/2022   Alcohol use disorder, severe, dependence ([place]) 03/16/2022   Recurrent major depressive episodes, moderate ([place]) 02/23/2022   Alcohol abuse    Suicidal ideation    Vitamin B1 deficiency 09/19/2021   Adjustment disorder with depressed mood    History of CHF (congestive heart failure) 08/07/2021   H/O ETOH abuse 08/07/2021   Chronic HFrEF (heart failure with reduced ejection fraction) ([place]) 07/23/2021   HIV (human immunodeficiency virus infection) ([place]) 07/23/2021   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

## 2022-12-29 NOTE — ED Provider Notes (Signed)
Emergency Medicine Observation Re-evaluation Note  William Powers is a 42 y.o. male, seen on rounds today.  Pt initially presented to the ED for complaints of alcohol intoxication, hx etoh use disorder and related mood disorder.  Pt indicates rested this AM, and feels improved. Reports normal appetite. Denies acute physical c/o. No thoughts of harm to self or others.   Physical Exam  BP (!) 143/98 (BP Location: Right Arm)   Pulse (!) 106   Temp 98 F (36.7 C) (Oral)   Resp 18   SpO2 97%  Physical Exam General: resting, easily aroused.  Cardiac: regular rate Lungs: breathing comfortably Psych: normal mood and affect. Pt does not appear acutely depressed or despondent. No thoughts of harm to self or others. Does not appear to be responding to internal stimuli. Thoughts appear clear. No delusions/hallucinations noted.   ED Course / MDM    I have reviewed the labs performed to date as well as medications administered while in observation.  Recent changes in the last 24 hours include ED obs, reassessment.   Plan  [facility] team has assessed and feels psych clear for d/c.   No acute psychosis noted or thoughts/plan to harm self.   Pt is encouraged to f/u closely with resources provided re etoh use tx and bh  and pcp f/u.  Return precautions provided.       Lajean Saver, MD 12/29/22 [phone]

## 2023-01-02 ENCOUNTER — Emergency Department (HOSPITAL_COMMUNITY)
Admission: EM | Admit: 2023-01-02 | Discharge: 2023-01-02 | Disposition: A | Discharge status: Home / Self Care | Payer: No Typology Code available for payment source | Attending: Emergency Medicine | Admitting: Emergency Medicine

## 2023-01-02 ENCOUNTER — Encounter (HOSPITAL_COMMUNITY): Payer: Self-pay

## 2023-01-02 DIAGNOSIS — Z87891 Personal history of nicotine dependence: Secondary | ICD-10-CM | POA: Insufficient documentation

## 2023-01-02 DIAGNOSIS — F1020 Alcohol dependence, uncomplicated: Secondary | ICD-10-CM | POA: Diagnosis present

## 2023-01-02 DIAGNOSIS — Z5900 Homelessness unspecified: Secondary | ICD-10-CM | POA: Insufficient documentation

## 2023-01-02 DIAGNOSIS — T1491XA Suicide attempt, initial encounter: Secondary | ICD-10-CM

## 2023-01-02 DIAGNOSIS — R45851 Suicidal ideations: Secondary | ICD-10-CM | POA: Diagnosis not present

## 2023-01-02 DIAGNOSIS — I509 Heart failure, unspecified: Secondary | ICD-10-CM | POA: Diagnosis not present

## 2023-01-02 DIAGNOSIS — I110 Hypertensive heart disease with heart failure: Secondary | ICD-10-CM | POA: Insufficient documentation

## 2023-01-02 DIAGNOSIS — Z21 Asymptomatic human immunodeficiency virus [HIV] infection status: Secondary | ICD-10-CM | POA: Diagnosis not present

## 2023-01-02 DIAGNOSIS — Y905 Blood alcohol level of 100-119 mg/100 ml: Secondary | ICD-10-CM | POA: Insufficient documentation

## 2023-01-02 LAB — COMPREHENSIVE METABOLIC PANEL
ALT: 29 U/L (ref 0–44)
AST: 50 U/L — ABNORMAL HIGH (ref 15–41)
Albumin: 3.5 g/dL (ref 3.5–5.0)
Alkaline Phosphatase: 50 U/L (ref 38–126)
Anion gap: 14 (ref 5–15)
BUN: 11 mg/dL (ref 6–20)
CO2: 18 mmol/L — ABNORMAL LOW (ref 22–32)
Calcium: 8.6 mg/dL — ABNORMAL LOW (ref 8.9–10.3)
Chloride: 104 mmol/L (ref 98–111)
Creatinine, Ser: 1.1 mg/dL (ref 0.61–1.24)
GFR, Estimated: >60 mL/min (ref >60)
Glucose, Bld: 95 mg/dL (ref 70–99)
Potassium: 4.3 mmol/L (ref 3.5–5.1)
Sodium: 136 mmol/L (ref 135–145)
Total Bilirubin: 1.8 mg/dL — ABNORMAL HIGH (ref 0.3–1.2)
Total Protein: 8.2 g/dL — ABNORMAL HIGH (ref 6.5–8.1)

## 2023-01-02 LAB — RAPID URINE DRUG SCREEN, HOSP PERFORMED
Amphetamines: NOT DETECTED
Barbiturates: NOT DETECTED
Benzodiazepines: NOT DETECTED
Cocaine: NOT DETECTED
Opiates: NOT DETECTED
Tetrahydrocannabinol: NOT DETECTED

## 2023-01-02 LAB — CBC
HCT: 40.7 % (ref 39.0–52.0)
Hemoglobin: 13.5 g/dL (ref 13.0–17.0)
MCH: 31.2 pg (ref 26.0–34.0)
MCHC: 33.2 g/dL (ref 30.0–36.0)
MCV: 94 fL (ref 80.0–100.0)
Platelets: 293 10*3/uL (ref 150–400)
RBC: 4.33 MIL/uL (ref 4.22–5.81)
RDW: 14.6 % (ref 11.5–15.5)
WBC: 3.6 10*3/uL — ABNORMAL LOW (ref 4.0–10.5)
nRBC: 0 % (ref 0.0–0.2)

## 2023-01-02 LAB — ACETAMINOPHEN LEVEL
Acetaminophen (Tylenol), Serum: <10 ug/mL — ABNORMAL LOW (ref 10–30)
Acetaminophen (Tylenol), Serum: <10 ug/mL — ABNORMAL LOW (ref 10–30)

## 2023-01-02 LAB — SALICYLATE LEVEL
Salicylate Lvl: <7 mg/dL — ABNORMAL LOW (ref 7.0–30.0)
Salicylate Lvl: <7 mg/dL — ABNORMAL LOW (ref 7.0–30.0)

## 2023-01-02 LAB — ETHANOL: Alcohol, Ethyl (B): 112 mg/dL — ABNORMAL HIGH (ref <10)

## 2023-01-02 LAB — CBG MONITORING, ED: Glucose-Capillary: 111 mg/dL — ABNORMAL HIGH (ref 70–99)

## 2023-01-02 NOTE — ED Notes (Signed)
Staffing called at [phone] for sitter.  Kennyth Lose in staffing states they currently do have sitter right now to send.  [employer] RN notified and aware as well.

## 2023-01-02 NOTE — ED Notes (Signed)
Patient approached sitter and requested update on status. Patient reports that he has a train to catch and needs an update. RN requesting update from practitioner at this time.

## 2023-01-02 NOTE — ED Triage Notes (Addendum)
Pt arrived to triage complaining of SI, states that he took all the pills and is feeling nauseous.  Pt states that he "took some fucked up pills, that were opiates" and then states that he took 72 aspirin over several hours started around 8pm last night  Pt is lethargic and nauseous Pt is also hypotensive in triage

## 2023-01-02 NOTE — ED Notes (Signed)
TTS at bedside. 

## 2023-01-02 NOTE — ED Provider Notes (Addendum)
[facility] Provider Note  CSN: [id number] Arrival date & time: 01/02/23 [id number]  Chief Complaint(s) Ingestion  HPI William Powers is a 42 y.o. male with a past medical history listed below including polysubstance use disorder and alcohol abuse very well-known to the emergency department for frequent visits for the same as well as passive suicidal ideation who presents to the emergency department stating that he took a bunch of pills around 8p  in an attempt to commit suicide. He states that he has not the left to live for.  Patient initially he stated that he took some "fucked up pills that were opiates."  On further investigation, patient reported that he got the pills from [facility] that were over-the-counter.  When asked what pills he took he stated that he did not know.  When asked if he took aspirin he said yes.  When asked the amount, he stated he took 75 pills.  Patient was brought in by EMS and when asked who called for transport, patient reported that he did.  He states that he is looking for help but then states that if he is discharged, he will go find some more pills and "finish the job."  The history is provided by the patient.    Past Medical History Past Medical History:  Diagnosis Date   Heart failure ([place])    HFrEF (heart failure with reduced ejection fraction) (HCC)    15% EF as of 2019   HIV (human immunodeficiency virus infection) ([place])    Hypertension    Polysubstance abuse ([place])    Pulmonary embolism ([place])    Single subsegmental pulmonary embolism without acute cor pulmonale ([place]) 07/23/2021   Patient Active Problem List   Diagnosis Date Noted   Dental caries 09/13/2022   Substance abuse ([place]) 05/03/2022   Polysubstance abuse ([place]) 04/29/2022   Alcohol use disorder, severe, dependence ([place]) 03/16/2022   Recurrent major depressive episodes, moderate ([place]) 02/23/2022   Alcohol abuse    Suicidal ideation    Vitamin  B1 deficiency 09/19/2021   Adjustment disorder with depressed mood    History of CHF (congestive heart failure) 08/07/2021   H/O ETOH abuse 08/07/2021   Chronic HFrEF (heart failure with reduced ejection fraction) ([place]) 07/23/2021   HIV (human immunodeficiency virus infection) ([place]) 07/23/2021   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 Authorizing Provider  Potassium Chloride ER 20 MEQ TBCR Take 20 mEq by mouth daily at 6 (six) AM. Patient not taking: No sig reported 08/07/21 09/05/21  Reubin Milan, MD  Allergies Bee venom, Lisinopril, and Sulfamethoxazole-trimethoprim  Review of Systems Review of Systems As noted in HPI  Physical Exam Vital Signs  I have reviewed the triage vital signs BP 107/76   Pulse 86   Temp 99.6 F (37.6 C) (Oral)   Resp (!) 21   Ht 6\' 4"  (1.93 m)   Wt 108.9 kg   SpO2 95%   BMI 29.21 kg/m   Physical Exam Vitals reviewed.  Constitutional:      General: He is not in acute distress.    Appearance: He is well-developed. He is not diaphoretic.  HENT:     Head: Normocephalic and atraumatic.     Right Ear: External ear normal.     Left Ear: External ear normal.     Nose: Nose normal.     Mouth/Throat:     Mouth: Mucous membranes are moist.  Eyes:     General: No scleral icterus.    Conjunctiva/sclera: Conjunctivae normal.  Neck:     Trachea: Phonation normal.  Cardiovascular:     Rate and Rhythm: Normal rate and regular rhythm.  Pulmonary:     Effort: Pulmonary effort is normal. No respiratory distress.     Breath sounds: No stridor.  Abdominal:     General: There is no distension.   Musculoskeletal:        General: Normal range of motion.     Cervical back: Normal range of motion.  Neurological:     Mental Status: He is alert and oriented to person, place, and time.  Psychiatric:        Behavior: Behavior normal.     ED Results and Treatments Labs (all labs ordered are listed, but only abnormal results are displayed) Labs Reviewed  COMPREHENSIVE METABOLIC PANEL - Abnormal; Notable for the following components:      Result Value   CO2 18 (*)    Calcium 8.6 (*)    Total Protein 8.2 (*)    AST 50 (*)    Total Bilirubin 1.8 (*)    All other components within normal limits  ETHANOL - Abnormal; Notable for the following components:   Alcohol, Ethyl (B) 112 (*)    All other components within normal limits  SALICYLATE LEVEL - Abnormal; Notable for the following components:   Salicylate Lvl Q000111Q (*)    All other components within normal limits  ACETAMINOPHEN LEVEL - Abnormal; Notable for the following components:   Acetaminophen (Tylenol), Serum <10 (*)    All other components within normal limits  CBC - Abnormal; Notable for the following components:   WBC 3.6 (*)    All other components within normal limits  CBG MONITORING, ED - Abnormal; Notable for the following components:   Glucose-Capillary 111 (*)    All other components within normal limits  RAPID URINE DRUG SCREEN, HOSP PERFORMED  EKG  EKG Interpretation  Date/Time:  Friday January 02 2023 03:15:05 EDT Ventricular Rate:  95 PR Interval:  170 QRS Duration: 96 QT Interval:  357 QTC Calculation: 449 R Axis:   66 Text Interpretation: Sinus rhythm RSR' in V1 or V2, probably normal variant Probable left ventricular hypertrophy Confirmed by Addison Lank [phone]) on 01/02/2023 4:52:32 AM       Radiology No results found.  Medications Ordered in ED Medications - No data to  display                                                                                                                                   Procedures Procedures  (including critical care time)  Medical Decision Making / ED Course  Click here for ABCD2, HEART and other calculators  Medical Decision Making Amount and/or Complexity of Data Reviewed Labs: ordered. Decision-making details documented in ED Course. ECG/medicine tests: ordered and independent interpretation performed. Decision-making details documented in ED Course.    Reported suicide attempt Difficult to discern whether this is secondary gain given his frequent visits for shelter due to him being homeless. Will obtain medical screening labs.  CBC without leukocytosis or anemia. Metabolic panel without significant electrolyte derangements or renal sufficiency. Acetaminophen level more than 4 hours after reported ingestion is negative.  Salicylate negative.  EtOH at 112.  He is HDS. Will IVC for [facility] evaluation and disposition once medically cleared.  Will repeat labs 4 hrs after presentation to ensure no uptrend in levels. If negative, I believe he would be medically cleared.   Patient care turned over to oncoming provider. Patient case and results discussed in detail; please see their note for further ED managment.      Final Clinical Impression(s) / ED Diagnoses Final diagnoses:  Suicide attempt [facility])           This chart was dictated using voice recognition software.  Despite best efforts to proofread,  errors can occur which can change the documentation meaning.    Fatima Blank, MD 01/02/23 [phone]

## 2023-01-02 NOTE — ED Notes (Addendum)
Pts belongings placed in white belongings bag (pants, phone,wallet, jacket, shoes) with patient label. Bag in locker #4. Pt changed into hospital gown.

## 2023-01-02 NOTE — ED Provider Notes (Addendum)
Reassessed patient.  No SI or HI at this time.  States that coming to the emergency department was because he had nowhere to stay.  Feel the patient is suitable for discharge and has already been cleared by psychiatry.  Will discharge with bus pass.  IVC rescinded.   Fransico Meadow, MD 01/02/23 [id number]    Fransico Meadow, MD 01/02/23 [phone]

## 2023-01-02 NOTE — Discharge Instructions (Signed)

## 2023-01-02 NOTE — ED Notes (Signed)
Staffing called about sitter for pt.  Reggie in staffing states they currently do not have anyone to send down.  Liz Claiborne RN notified and aware.

## 2023-01-02 NOTE — Consult Note (Signed)
[facility] Psychiatry Consult   Reason for Consult: Psych consult Referring Physician:   Lacretia Leigh, MD  Patient Identification: William Powers MRN:  [id number] Principal Diagnosis: Suicidal ideation Diagnosis:  Principal Problem:   Suicidal ideation Active Problems:   Alcohol use disorder, severe, dependence ([place])   Total Time spent with patient: 30 minutes  Subjective:   William Powers is a 42 y.o. male patient admitted with Suicidal Ideation. Patient stated "I got drunk and said some stuff just to get in here to get a bed".  HPI:  William Powers is a 42 y.o male patient with a history of polysubstance abuse, depression, suicidal ideation, alcohol use disorder, malingering, who presented to the ED with a complaint of suicidal ideation.   Patient was seen face to face by this provider, consulted with Dr. Dwyane Dee and chart reviewed.   Per chart review, patient has a history of presenting to the ED with complaint of chest pain or shortness of breath and suicidal ideation. Later in the morning patient will ask for discharge saying he is ready to leave. During one of his presentation, patient came in for chest pain, and was later angry at one of the staff, patient requested to leave, he was redirected but he later left AMA. Patient is homeless, and sometimes live in [place].  On evaluation today, patient is alert and oriented x3, speech is clear and coherent. Patient's eye contact is good, mood is euthymic, affect is appropriate. Patient's thought process is goal directed and thought content is within normal limits. Patient denies SI, HI, denies AVH, or paranoia. Patient does not appear to be responding to internal stimuli and no delusion noted.   Patient reported to this provider that after he drank liquor last night he ran out of money, so he came to the ED and he said something just for him to get a bed. Patient stated " I just said what I said to get a place to sleep".   Patient said "I am not suicidal and I did not take any aspirin". Patient says he drinks liquor every day. He denies any drug use. Patient denies having seizures or DTs connected to alcohol withdrawal. Patient denies taking any medication. Patient stated "I have a history of congestive heart failure".  Patient states he lives in [place] or shelters and sometimes in his aunt's house. Patient says he just want to get a bus pass and go to [place]. Patient says he has family in [place], he also added that he has resources like [facility] [place] where he goes sometimes. He also reported that he has an aunt who lives in [place]. Patient refused to give this provider any family's contact.   Patient's salicylate and tylenol is negative. Patient denies suicidal ideation. Patient contracts for safety. Patient expresses his desire to catch a bus to [place] downtown.  Support, encouragement and reassurance provided about ongoing stressors and patient provided with opportunity for questions.     Patient does not meet criteria for inpatient admission. Will be given resources and given bus pass if needed and be discharged.   This provider discussed with patient, safety plans, outpatient and mental health resources available in the community. Patient expressed his interest. Patient verbalized understanding of safety plans.  Past Psychiatric History: History of tobacco use, adjustment disorder with depressed mood, recurrent major depressive episode moderate, suicidal ideation, polysubstance abuse, malingering, alcohol use disorder severe dependence.  Risk to Self: No Risk to Others:No Prior Inpatient  Therapy: Yes Prior Outpatient Therapy: Unknown  Past Medical History:  Past Medical History:  Diagnosis Date   Heart failure (HCC)    HFrEF (heart failure with reduced ejection fraction) (HCC)    15% EF as of 2019   HIV (human immunodeficiency virus infection) ([place])    Hypertension     Polysubstance abuse ([place])    Pulmonary embolism (HCC)    Single subsegmental pulmonary embolism without acute cor pulmonale ([place]) 07/23/2021    Past Surgical History:  Procedure Laterality Date   HERNIA REPAIR     Family History:  Family History  Problem Relation Age of Onset   Lupus Mother    HIV Mother    HIV Father    Family Psychiatric  History: Provided Social History:  Social History   Substance and Sexual Activity  Alcohol Use Yes   Comment: 10 pints per week     Social History   Substance and Sexual Activity  Drug Use Yes   Types: Marijuana, Cocaine    Social History   Socioeconomic History   Marital status: Divorced    Spouse name: Not on file   Number of children: Not on file   Years of education: Not on file   Highest education level: Not on file  Occupational History   Not on file  Tobacco Use   Smoking status: Some Days    Packs/day: .5    Types: Cigarettes, Cigars   Smokeless tobacco: Never  Vaping Use   Vaping Use: Never used  Substance and Sexual Activity   Alcohol use: Yes    Comment: 10 pints per week   Drug use: Yes    Types: Marijuana, Cocaine   Sexual activity: Not on file  Other Topics Concern   Not on file  Social History Narrative   Not on file   Social Determinants of Health   Financial Resource Strain: Not on file  Food Insecurity: Not on file  Transportation Needs: Not on file  Physical Activity: Not on file  Stress: Not on file  Social Connections: Not on file   Additional Social History:    Allergies:   Allergies  Allergen Reactions   Bee Venom Anaphylaxis   Zestril [Lisinopril] Swelling    Lip swelling    Bactrim [Sulfamethoxazole-Trimethoprim] Swelling and Rash    Labs:  Results for orders placed or performed during the hospital encounter of 01/02/23 (from the past 48 hour(s))  Comprehensive metabolic panel     Status: Abnormal   Collection Time: 01/02/23  3:24 AM  Result Value Ref Range   Sodium 136  135 - 145 mmol/L   Potassium 4.3 3.5 - 5.1 mmol/L    Comment: HEMOLYSIS AT THIS LEVEL MAY AFFECT RESULT   Chloride 104 98 - 111 mmol/L   CO2 18 (L) 22 - 32 mmol/L   Glucose, Bld 95 70 - 99 mg/dL    Comment: Glucose reference range applies only to samples taken after fasting for at least 8 hours.   BUN 11 6 - 20 mg/dL   Creatinine, Ser 1.10 0.61 - 1.24 mg/dL   Calcium 8.6 (L) 8.9 - 10.3 mg/dL   Total Protein 8.2 (H) 6.5 - 8.1 g/dL   Albumin 3.5 3.5 - 5.0 g/dL   AST 50 (H) 15 - 41 U/L    Comment: HEMOLYSIS AT THIS LEVEL MAY AFFECT RESULT   ALT 29 0 - 44 U/L    Comment: HEMOLYSIS AT THIS LEVEL MAY AFFECT RESULT   Alkaline  Phosphatase 50 38 - 126 U/L    Comment: HEMOLYSIS AT THIS LEVEL MAY AFFECT RESULT   Total Bilirubin 1.8 (H) 0.3 - 1.2 mg/dL    Comment: HEMOLYSIS AT THIS LEVEL MAY AFFECT RESULT   GFR, Estimated >60 >60 mL/min    Comment: (NOTE) Calculated using the CKD-EPI Creatinine Equation (2021)    Anion gap 14 5 - 15    Comment: Performed at [facility] [address], [place], [place] [address]  Ethanol     Status: Abnormal   Collection Time: 01/02/23  3:24 AM  Result Value Ref Range   Alcohol, Ethyl (B) 112 (H) <10 mg/dL    Comment: (NOTE) Lowest detectable limit for serum alcohol is 10 mg/dL.  For medical purposes only. Performed at [facility], [place] [address], [place], [place] [id number]   Salicylate level     Status: Abnormal   Collection Time: 01/02/23  3:24 AM  Result Value Ref Range   Salicylate Lvl Q000111Q (L) 7.0 - 30.0 mg/dL    Comment: Performed at [facility] [address] [place], [place] [address]  Acetaminophen level     Status: Abnormal   Collection Time: 01/02/23  3:24 AM  Result Value Ref Range   Acetaminophen (Tylenol), Serum <10 (L) 10 - 30 ug/mL    Comment: (NOTE) Therapeutic concentrations vary significantly. A range of 10-30 ug/mL  may be an effective concentration for many patients. However, some  are best treated at  concentrations outside of this range. Acetaminophen concentrations >150 ug/mL at 4 hours after ingestion  and >50 ug/mL at 12 hours after ingestion are often associated with  toxic reactions.  Performed at [facility], [place] [address] [place], [place] [address]   cbc     Status: Abnormal   Collection Time: 01/02/23  3:24 AM  Result Value Ref Range   WBC 3.6 (L) 4.0 - 10.5 K/uL   RBC 4.33 4.22 - 5.81 MIL/uL   Hemoglobin 13.5 13.0 - 17.0 g/dL   HCT 40.7 39.0 - 52.0 %   MCV 94.0 80.0 - 100.0 fL   MCH 31.2 26.0 - 34.0 pg   MCHC 33.2 30.0 - 36.0 g/dL   RDW 14.6 11.5 - 15.5 %   Platelets 293 150 - 400 K/uL   nRBC 0.0 0.0 - 0.2 %    Comment: Performed at [facility], [place] [address] [place], [place] [address]  CBG monitoring, ED     Status: Abnormal   Collection Time: 01/02/23  3:28 AM  Result Value Ref Range   Glucose-Capillary 111 (H) 70 - 99 mg/dL    Comment: Glucose reference range applies only to samples taken after fasting for at least 8 hours.  Acetaminophen level     Status: Abnormal   Collection Time: 01/02/23  7:00 AM  Result Value Ref Range   Acetaminophen (Tylenol), Serum <10 (L) 10 - 30 ug/mL    Comment: (NOTE) Therapeutic concentrations vary significantly. A range of 10-30 ug/mL  may be an effective concentration for many patients. However, some  are best treated at concentrations outside of this range. Acetaminophen concentrations >150 ug/mL at 4 hours after ingestion  and >50 ug/mL at 12 hours after ingestion are often associated with  toxic reactions.  Performed at [facility], [place] [address] [place], [place] [id number]   Salicylate level     Status: Abnormal   Collection Time: 01/02/23  7:00 AM  Result Value Ref Range   Salicylate Lvl Q000111Q (L) 7.0 - 30.0 mg/dL    Comment: HEMOLYSIS AT THIS LEVEL MAY AFFECT RESULT Performed at [facility], [place] [address], [place], [place] [address]   Rapid urine drug screen (hospital performed)      Status: None   Collection Time: 01/02/23 11:30 AM  Result Value Ref Range   Opiates NONE DETECTED NONE DETECTED   Cocaine NONE DETECTED NONE DETECTED   Benzodiazepines NONE DETECTED NONE DETECTED   Amphetamines NONE DETECTED NONE DETECTED   Tetrahydrocannabinol NONE DETECTED NONE DETECTED   Barbiturates NONE DETECTED NONE DETECTED    Comment: (NOTE) DRUG SCREEN FOR MEDICAL PURPOSES ONLY.  IF CONFIRMATION IS NEEDED FOR ANY PURPOSE, NOTIFY LAB WITHIN 5 DAYS.  LOWEST DETECTABLE LIMITS FOR URINE DRUG SCREEN Drug Class                     Cutoff (ng/mL) Amphetamine and metabolites    1000 Barbiturate and metabolites    200 Benzodiazepine                 200 Opiates and metabolites        300 Cocaine and metabolites        300 THC                            50 Performed at [facility], [place] [address] [place], [place] [address]     No current facility-administered medications for this encounter.   No current outpatient medications on file.    Musculoskeletal: Strength & Muscle Tone: within normal limits Gait & Station: normal Patient leans: N/A   Psychiatric Specialty Exam:  Presentation  General Appearance:  Appropriate for Environment  Eye Contact: Good  Speech: Clear and Coherent  Speech Volume: Normal  Handedness: Right   Mood and Affect  Mood: Euthymic  Affect: Appropriate   Thought Process  Thought Processes: Goal Directed  Descriptions of Associations:Intact  Orientation:Full (Time, Place and Person)  Thought Content:WDL  History of Schizophrenia/Schizoaffective disorder:No  Duration of Psychotic Symptoms:N/A  Hallucinations:Hallucinations: None  Ideas of Reference:None  Suicidal Thoughts:Suicidal Thoughts: No  Homicidal Thoughts:Homicidal Thoughts: No   Sensorium  Memory: Immediate Good; Recent Good; Remote Good  Judgment: Fair  Insight: Fair   Community education officer  Concentration: Good  Attention  Span: Good  Recall: Good  Fund of Knowledge: Good  Language: Good   Psychomotor Activity  Psychomotor Activity: Psychomotor Activity: Normal   Assets  Assets: Communication Skills; Physical Health; Social Support   Sleep  Sleep: Sleep: Good   Physical Exam: Physical Exam Vitals and nursing note reviewed.  Eyes:     General:        Right eye: No discharge.        Left eye: No discharge.  Cardiovascular:     Pulses: Normal pulses.  Pulmonary:     Effort: No respiratory distress.     Breath sounds: No wheezing.  Skin:    Findings: No bruising.  Neurological:     Mental Status: He is alert and oriented to person, place, and time.     Motor: No weakness.  Psychiatric:        Attention and Perception: Attention normal. He does not perceive auditory or visual hallucinations.        Mood and Affect: Mood normal.        Speech: Speech normal.  Behavior: Behavior normal. Behavior is cooperative.        Thought Content: Thought content is not paranoid. Thought content does not include homicidal or suicidal ideation.    Review of Systems  Constitutional:  Negative for diaphoresis, fever and malaise/fatigue.  HENT:  Negative for ear discharge, ear pain and hearing loss.   Eyes:  Negative for discharge and redness.  Respiratory:  Negative for cough, shortness of breath and wheezing.   Cardiovascular:  Negative for chest pain and palpitations.  Gastrointestinal:  Negative for abdominal pain, nausea and vomiting.  Musculoskeletal:  Negative for neck pain.  Neurological:  Negative for dizziness, seizures and weakness.  Psychiatric/Behavioral:  Negative for depression, hallucinations, substance abuse and suicidal ideas.    Blood pressure 115/86, pulse 88, temperature 97.8 F (36.6 C), temperature source Oral, resp. rate 14, height 6\' 4"  (1.93 m), weight 108.9 kg, SpO2 95 %. Body mass index is 29.21 kg/m.  Treatment Plan Summary: Plan : Patient will be given  resources for shelters, outpatient and mental health services in the community. Patient will be given bus pass if needed.  Patient will be discharged.  Disposition: No evidence of imminent risk to self or others at present.   Patient does not meet criteria for psychiatric inpatient admission. Supportive therapy provided about ongoing stressors. Discussed crisis plan, support from social network, calling 911, coming to the Emergency Department, and calling Suicide Hotline.  Patient will be discharged.  Discussed methods to reduce the risk of self-injury or suicide attempts: Frequent conversations regarding unsafe thoughts. Remove all significant sharps. Remove all firearms. Remove all medications, including over-the-counter meds. Consider lockbox for medications and having a responsible person dispense medications until patient has strengthened coping skills. Room checks for sharps or other harmful objects. Secure all chemical substances that can be ingested or inhaled.   Please refrain from using alcohol or illicit substances, as they can affect your mood and can cause depression, anxiety or other concerning symptoms.  Alcohol can increase the chance that a person will make reckless decisions, like attempting suicide, and can increase the lethality of a drug overdose.    Discussed crisis plan, calling 911, or going to the ED if condition changes or worsens. Patient verbalized understanding.    Earney Mallet, NP 01/02/2023 3:18 PM

## 2023-01-05 ENCOUNTER — Emergency Department (HOSPITAL_COMMUNITY)
Admission: EM | Admit: 2023-01-05 | Discharge: 2023-01-06 | Disposition: A | Discharge status: Home / Self Care | Payer: Medicaid Other | Attending: Emergency Medicine | Admitting: Emergency Medicine

## 2023-01-05 DIAGNOSIS — R072 Precordial pain: Secondary | ICD-10-CM | POA: Diagnosis not present

## 2023-01-05 DIAGNOSIS — I509 Heart failure, unspecified: Secondary | ICD-10-CM | POA: Insufficient documentation

## 2023-01-05 DIAGNOSIS — Z789 Other specified health status: Secondary | ICD-10-CM

## 2023-01-05 DIAGNOSIS — F10920 Alcohol use, unspecified with intoxication, uncomplicated: Secondary | ICD-10-CM | POA: Diagnosis not present

## 2023-01-05 DIAGNOSIS — I110 Hypertensive heart disease with heart failure: Secondary | ICD-10-CM | POA: Insufficient documentation

## 2023-01-05 DIAGNOSIS — E876 Hypokalemia: Secondary | ICD-10-CM | POA: Insufficient documentation

## 2023-01-05 DIAGNOSIS — R079 Chest pain, unspecified: Secondary | ICD-10-CM | POA: Diagnosis present

## 2023-01-05 NOTE — ED Triage Notes (Addendum)
Pt arrived to triage by EMS from home complaining of chest pain, that has been ongoing for 12yrs but is worse tonight Pt has hx of SI and SI attempts but denies SI at this time   Pt has ETOH on board, pt states he thinks that is why he is feeling more nauseous than normal

## 2023-01-05 NOTE — ED Provider Triage Note (Signed)
Emergency Medicine Provider Triage Evaluation Note  William Powers , a 42 y.o. male  was evaluated in triage.  Pt complains of chest pain.  States worse after eating.  He will belch and pain gets better but comes back.  He denies vomiting.  Does have hx of CHF. Does have EtOH on board.    Reported SI to triage RN but denies this to me stating "not today".  Review of Systems  Positive: Chest pain Negative: fever  Physical Exam  BP 122/79   Pulse 84   Temp 98 F (36.7 C)   Resp 18   Ht 6\' 4"  (1.93 m)   Wt 108 kg   SpO2 94%   BMI 28.98 kg/m   Gen:   Awake, no distress   Resp:  Normal effort  MSK:   Moves extremities without difficulty  Other:    Medical Decision Making  Medically screening exam initiated at 11:54 PM.  Appropriate orders placed.  William Powers was informed that the remainder of the evaluation will be completed by another provider, this initial triage assessment does not replace that evaluation, and the importance of remaining in the ED until their evaluation is complete.  Chest pain.  Has EtOH on board.  Denies SI to me during exam.  EKG, labs, CXR.   William Hatchet, PA-C 01/06/23 [phone]

## 2023-01-06 ENCOUNTER — Encounter (HOSPITAL_COMMUNITY): Payer: Self-pay

## 2023-01-06 ENCOUNTER — Other Ambulatory Visit: Payer: Self-pay

## 2023-01-06 ENCOUNTER — Emergency Department (HOSPITAL_COMMUNITY): Payer: Medicaid Other

## 2023-01-06 LAB — COMPREHENSIVE METABOLIC PANEL
ALT: 32 U/L (ref 0–44)
AST: 38 U/L (ref 15–41)
Albumin: 3.7 g/dL (ref 3.5–5.0)
Alkaline Phosphatase: 54 U/L (ref 38–126)
Anion gap: 15 (ref 5–15)
BUN: 10 mg/dL (ref 6–20)
CO2: 19 mmol/L — ABNORMAL LOW (ref 22–32)
Calcium: 8.6 mg/dL — ABNORMAL LOW (ref 8.9–10.3)
Chloride: 102 mmol/L (ref 98–111)
Creatinine, Ser: 1.03 mg/dL (ref 0.61–1.24)
GFR, Estimated: >60 mL/min (ref >60)
Glucose, Bld: 89 mg/dL (ref 70–99)
Potassium: 3.1 mmol/L — ABNORMAL LOW (ref 3.5–5.1)
Sodium: 136 mmol/L (ref 135–145)
Total Bilirubin: 0.7 mg/dL (ref 0.3–1.2)
Total Protein: 8.6 g/dL — ABNORMAL HIGH (ref 6.5–8.1)

## 2023-01-06 LAB — CBC WITH DIFFERENTIAL/PLATELET
Abs Immature Granulocytes: 0.01 10*3/uL (ref 0.00–0.07)
Basophils Absolute: 0 10*3/uL (ref 0.0–0.1)
Basophils Relative: 1 %
Eosinophils Absolute: 0.1 10*3/uL (ref 0.0–0.5)
Eosinophils Relative: 2 %
HCT: 40.6 % (ref 39.0–52.0)
Hemoglobin: 14.1 g/dL (ref 13.0–17.0)
Immature Granulocytes: 0 %
Lymphocytes Relative: 36 %
Lymphs Abs: 1.2 10*3/uL (ref 0.7–4.0)
MCH: 32 pg (ref 26.0–34.0)
MCHC: 34.7 g/dL (ref 30.0–36.0)
MCV: 92.3 fL (ref 80.0–100.0)
Monocytes Absolute: 0.3 10*3/uL (ref 0.1–1.0)
Monocytes Relative: 9 %
Neutro Abs: 1.7 10*3/uL (ref 1.7–7.7)
Neutrophils Relative %: 52 %
Platelets: 278 10*3/uL (ref 150–400)
RBC: 4.4 MIL/uL (ref 4.22–5.81)
RDW: 14.1 % (ref 11.5–15.5)
WBC: 3.3 10*3/uL — ABNORMAL LOW (ref 4.0–10.5)
nRBC: 0 % (ref 0.0–0.2)

## 2023-01-06 LAB — ETHANOL: Alcohol, Ethyl (B): 128 mg/dL — ABNORMAL HIGH (ref <10)

## 2023-01-06 LAB — TROPONIN I (HIGH SENSITIVITY)
Troponin I (High Sensitivity): 6 ng/L (ref <18)
Troponin I (High Sensitivity): 6 ng/L (ref <18)

## 2023-01-06 LAB — BRAIN NATRIURETIC PEPTIDE: B Natriuretic Peptide: 14.7 pg/mL (ref 0.0–100.0)

## 2023-01-06 NOTE — ED Notes (Signed)
Pt resting with eyes closed; respirations spontaneous, even, unlabored 

## 2023-01-06 NOTE — ED Notes (Signed)
IV removed from [facility]; catheter intact; site clean, dry; pt given food, beverage, and bus pass

## 2023-01-06 NOTE — Discharge Instructions (Signed)
Substance Abuse Treatment Programs ° °Intensive Outpatient Programs °[facility]     °[address]      °[place], [place]                   °[phone]      ° °[facility] °[address] °[place], [place] °[phone] ° °[facility]     °(Inpatient and outpatient)     °[address]           °[phone]   ° °[facility] °[phone] (Suboxone and Methadone) ° °[address]      °[place], [place] [address]      °336-882-2125      ° °[address] °[place], [place] °852-3033 ° °[facility] (Outpatient/Inpatient, Chemical)    °(insurance only) [phone]      °       °[facility] (Groups & Residential) °[place], [place] °336-389-1413 ° °   °[facility]     °[address]     °[place], [place]      °[phone]      ° °[facility] (for caregivers and family) °[address] °[place], [place] °336-299-4655 ° ° ° ° ° °Residential Treatment Programs °[facility]      °[address], [place], [place] [address]  °[phone]      ° °[facility] °[address] , [place] [address] °[phone] ° °[facility]        °336-248-8914      ° °[facility] °[phone] ° °[facility]             °[address]                                         °[place], [place]                                                °877-615-2722 or 336-784-9470                              ° °[facility] [place] °[address] °[place], [address] °1.877.941.8954 ° °[facility]    °[address]      °[place], [place]     °[phone]      ° °[facility] °[address] °[place], [place] °336-285-9073 ° °[facility]   °[address]     °[place], [place] [address]     °[phone]      °Admissions: 8am-3pm M-F ° °[facility] °[address] °[place],  [place] °336-227-7417 ° °[facility]: Residential Program (90 Days)   °[place], [place]      °336-725-8389 or 800-758-6077    ° °[facility] °[place], [place] °(Walk in Hours over the weekend or by referral) ° °[facility] °[address], [place],  [address] °([phone] ° °Crisis Mobile: [facility]:  1-877-626-1772 (for crisis response 24 hours a day) °[facility] Hotline:      1-800-256-2452 °Outpatient Psychiatry and Counseling ° °[facility]: Mobile Crisis   Management 24 hours:  1-877-626-1772 ° °[facility] sliding scale fee and walk in schedule: M-F 8am-12pm/1pm-3pm °[address]  °[place], [place] [address] °[phone] ° °[facility] °[address] °[place], [place] [address] °336-703-9650 ° °[facility] ([facility] known [facility])- new patient walk-in appointments available Monday - Friday 8am -3pm.          °[address] °[place], [place] [address] °336-676-6840 or crisis line- 336-676-6905 ° °[facility] Intensive Outpatient Therapy Program °[address] °[place], [place] [address] °336-832-9804 ° °[facility]                  °Crisis Services      °[phone]      °[address]     °[place], [place] [address]                ° °[facility]   °[facility] °[phone] °[address] °[place], [place] [address] ° ° °[facility]          °[address]  °[place], [place] [address]       °[phone] ° °[facility] °[address] °[place], [place] [address] °336-292-1510 ° °[facility]    °[address]    °[place], [place] [address]     °336-632-3505      ° °Parish McKinney, MD     °[address]     °[place] [address]     °336-282-1251     °  °[facility] °[address] °[place] [address] ° °[facility]     °[address]     °[place], [place]      °336-542-2076      ° °[facility] °Shamsher Ahluwalia, MD °[address] °[place], [place] [address] °336-420-9558 ° °[facility]     °[address]     °[place], [place] [address]     °336-274-1237      ° °[facility] °[address] °[place], [place] [address] °336-854-4450 °Resources for Temporary Residential Assistance/Crisis Centers ° °DAY CENTERS °[facility] °M-F 8am-3pm   °[address] [place], [place] [address]   336-332-0824 °Services include: laundry, barbering, support groups, case management, phone  & computer access, showers, AA/NA mtgs, mental health/substance abuse nurse, job skills class, disability information, VA assistance, spiritual classes, etc.  ° °HOMELESS SHELTERS ° °[facility]     °[facility]   °[address], [place]     °[phone]       °       °[facility] (women and children)       °[address] °[place], [place] [address] °336-275-0820 °[email or web address] for application and process °Application Required ° °[facility]   °[address]    °[place]  [address]     °[phone]       °             °[facility] °[address] °[place],  [address] °336.273.5572 °336-235-0363(schedule application appt.) °Application Required ° °[facility] (women only)    °[address]     °[place],  [address]     °336-884-1039      °  Intake starts 6pm daily °Need valid ID, SSC, & Police report °[facility] [place] °[address] °[place], [place] °336-881-5420 °Application Required ° °[facility] (men only)     °[address]      °[place], [place]     °[phone]      ° °[facility] °(Pregnant women only) °[address] °[place], [place] °336-275-0206 ° °[facility]      °[address]      °[place], [place] [address]     °336-722-9951      °       °[facility] °[address] °[place], [place] °336-723-1848 °90 day commitment/SA/Application process ° °[facility](men only)     °[address]     °[place], [place]     °[phone]       °Check-in at 7pm     °       °[facility] °[address] °[place], [place] [address] °336-248-6684 °Men/Women/Women and Children must be there by 7 pm ° °[facility] °[place], [place] °336-722-8721                ° °

## 2023-01-06 NOTE — ED Provider Notes (Signed)
[facility] Provider Note   CSN: [id number] Arrival date & time: 01/05/23  2346     History  Chief Complaint  Patient presents with   Chest Pain    William Powers is a 42 y.o. male.   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 He later told nursing he had chest pain for 3 years. He then reported he was not suicidal.  On my arrival to room patient is sleeping comfortably.  He reports he needs some help.  When asked about chest pain he said "oh yeah I'm having chest pain"   Past Medical History:  Diagnosis Date   Heart failure ([place])    HFrEF (heart failure with reduced ejection fraction) ([facility])    15% EF as of 2019   HIV (human immunodeficiency virus infection) ([place])    Hypertension    Polysubstance abuse ([place])    Pulmonary embolism ([place])    Single subsegmental pulmonary embolism without acute cor pulmonale ([place]) 07/23/2021    Home Medications Prior to Admission medications   Medication Sig Start Date End Date Taking? Authorizing Provider  Potassium Chloride ER 20 MEQ TBCR Take 20 mEq by mouth daily at 6 (six) AM. Patient not taking: No sig reported 08/07/21 09/05/21  Reubin Milan, MD      Allergies    Bee venom, Zestril [lisinopril], and Bactrim [sulfamethoxazole-trimethoprim]    Review of Systems   Review of Systems  Cardiovascular:  Positive for chest pain.    Physical Exam Updated Vital Signs BP 117/89   Pulse 86   Temp 98 F (36.7 C)   Resp 16   Ht 1.93 m (6\' 4" )   Wt 108 kg   SpO2 96%   BMI 28.98 kg/m  Physical Exam CONSTITUTIONAL: Disheveled, smells of alcohol, resting comfortably HEAD: Normocephalic/atraumatic  CV: [id number]  noted, no murmurs/rubs/gallops noted LUNGS: Lungs are clear to auscultation bilaterally, no apparent distress ABDOMEN: soft, NEURO: Pt is sleeping but easily arousable, moves all extremities x 4 EXTREMITIES: pulses normal/equalx4, full ROM SKIN: warm, color normal PSYCH: Patient is sleeping, unable to assess  ED Results / Procedures / Treatments   Labs (all labs ordered are listed, but only abnormal results are displayed) Labs Reviewed  CBC WITH DIFFERENTIAL/PLATELET - Abnormal; Notable for the following components:      Result Value   WBC 3.3 (*)    All other components within normal limits  COMPREHENSIVE METABOLIC PANEL - Abnormal; Notable for the following components:   Potassium 3.1 (*)    CO2 19 (*)    Calcium 8.6 (*)    Total Protein 8.6 (*)    All other components within normal limits  ETHANOL - Abnormal; Notable for the following components:   Alcohol, Ethyl (B) 128 (*)    All other components within normal limits  BRAIN NATRIURETIC PEPTIDE  TROPONIN I (HIGH SENSITIVITY)  TROPONIN I (HIGH SENSITIVITY)    EKG EKG Interpretation  Date/Time:  Tuesday January 06 2023 02:45:27 EDT Ventricular Rate:  86 PR Interval:  176 QRS Duration: 101 QT Interval:  372 QTC Calculation: 445 R Axis:   63 Text Interpretation: Sinus rhythm RSR' in V1 or V2, probably normal variant Borderline T wave abnormalities ST elev,  probable normal early repol pattern Baseline wander in lead(s) V2 No significant change since last tracing Confirmed by Ripley Fraise [id number]) on 01/06/2023 2:46:46 AM  Radiology DG Chest 2 View  Result Date: 01/06/2023 CLINICAL DATA:  Chest pain EXAM: CHEST - 2 VIEW COMPARISON:  12/24/2022 FINDINGS: The heart size and mediastinal contours are within normal limits. Both lungs are clear. The visualized skeletal structures are unremarkable. IMPRESSION: Normal study Electronically Signed   By: Rolm Baptise M.D.   On: 01/06/2023 00:12    Procedures Procedures     Medications Ordered in ED Medications - No data to display  ED Course/ Medical Decision Making/ A&P Clinical Course as of 01/06/23 0624  Tue Jan 06, 2023  0245 Potassium(!): 3.1 Mild hypokalemia [DW]  0245 Alcohol, Ethyl (B)(!): 128 Alcohol intoxication [DW]  0251 Patient presents for 17th ER visit in 6 months.  Patient frequently arrives intoxicated reporting suicidality and chest pain.  He was recently seen by behavioral health and cleared for discharge.  Workup thus far is unremarkable.  Will reassess [DW]  [phone] Patient slept for several hours in no distress.  He feels improved upon waking up.  Outpatient resources have been provided.  Patient requesting a snack bag and a bus pass [DW]    Clinical Course User Index [DW] Ripley Fraise, MD                             Medical Decision Making Amount and/or Complexity of Data Reviewed Labs:  Decision-making details documented in ED Course. ECG/medicine tests: ordered.           Final Clinical Impression(s) / ED Diagnoses Final diagnoses:  Precordial pain  Alcohol use    Rx / DC Orders ED Discharge Orders     None         Ripley Fraise, MD 01/06/23 [phone]

## 2023-01-06 NOTE — ED Notes (Signed)
Patient verbalizes understanding of discharge instructions. Opportunity for questioning and answers were provided. Pt discharged from ED. 

## 2023-01-07 ENCOUNTER — Ambulatory Visit (HOSPITAL_COMMUNITY)
Admission: EM | Admit: 2023-01-07 | Discharge: 2023-01-08 | Disposition: A | Discharge status: Home / Self Care | Payer: No Typology Code available for payment source | Attending: Nurse Practitioner | Admitting: Nurse Practitioner

## 2023-01-07 DIAGNOSIS — R45851 Suicidal ideations: Secondary | ICD-10-CM

## 2023-01-07 DIAGNOSIS — Z1152 Encounter for screening for COVID-19: Secondary | ICD-10-CM | POA: Insufficient documentation

## 2023-01-07 DIAGNOSIS — Z5901 Sheltered homelessness: Secondary | ICD-10-CM

## 2023-01-07 DIAGNOSIS — F332 Major depressive disorder, recurrent severe without psychotic features: Secondary | ICD-10-CM

## 2023-01-07 NOTE — ED Triage Notes (Signed)
Pt presents to [facility] voluntarily, unaccompanied at this time with complaint of depression and suicidal ideation with a plan to jump off of a bridge. Pt reports family issues and dealing with a major loss has caused him to feel depressed. Pt also reports drinking a few pints of liquor within the last 24 hours. Pt has had numerous ED visits within the last few months and has history of polysubstance abuse, depression, suicidal ideation, alcohol use disorder, malingering. Pt denies HI, AVH.

## 2023-01-08 ENCOUNTER — Encounter (HOSPITAL_COMMUNITY): Payer: Self-pay | Admitting: Emergency Medicine

## 2023-01-08 DIAGNOSIS — F332 Major depressive disorder, recurrent severe without psychotic features: Secondary | ICD-10-CM

## 2023-01-08 LAB — RESP PANEL BY RT-PCR (RSV, FLU A&B, COVID)  RVPGX2
Influenza A by PCR: NEGATIVE
Influenza B by PCR: NEGATIVE
Resp Syncytial Virus by PCR: NEGATIVE
SARS Coronavirus 2 by RT PCR: NEGATIVE

## 2023-01-08 LAB — CBC WITH DIFFERENTIAL/PLATELET
Abs Immature Granulocytes: 0.01 10*3/uL (ref 0.00–0.07)
Basophils Absolute: 0 10*3/uL (ref 0.0–0.1)
Basophils Relative: 0 %
Eosinophils Absolute: 0.1 10*3/uL (ref 0.0–0.5)
Eosinophils Relative: 3 %
HCT: 38.7 % — ABNORMAL LOW (ref 39.0–52.0)
Hemoglobin: 13.3 g/dL (ref 13.0–17.0)
Immature Granulocytes: 0 %
Lymphocytes Relative: 27 %
Lymphs Abs: 1.4 10*3/uL (ref 0.7–4.0)
MCH: 31.4 pg (ref 26.0–34.0)
MCHC: 34.4 g/dL (ref 30.0–36.0)
MCV: 91.5 fL (ref 80.0–100.0)
Monocytes Absolute: 0.5 10*3/uL (ref 0.1–1.0)
Monocytes Relative: 9 %
Neutro Abs: 3.2 10*3/uL (ref 1.7–7.7)
Neutrophils Relative %: 61 %
Platelets: 302 10*3/uL (ref 150–400)
RBC: 4.23 MIL/uL (ref 4.22–5.81)
RDW: 14.2 % (ref 11.5–15.5)
WBC: 5.3 10*3/uL (ref 4.0–10.5)
nRBC: 0 % (ref 0.0–0.2)

## 2023-01-08 LAB — POCT URINE DRUG SCREEN - MANUAL ENTRY (I-SCREEN)
POC Amphetamine UR: NOT DETECTED
POC Buprenorphine (BUP): NOT DETECTED
POC Cocaine UR: POSITIVE — AB
POC Marijuana UR: NOT DETECTED
POC Methadone UR: NOT DETECTED
POC Methamphetamine UR: NOT DETECTED
POC Morphine: NOT DETECTED
POC Oxazepam (BZO): NOT DETECTED
POC Oxycodone UR: NOT DETECTED
POC Secobarbital (BAR): NOT DETECTED

## 2023-01-08 LAB — COMPREHENSIVE METABOLIC PANEL
ALT: 39 U/L (ref 0–44)
AST: 46 U/L — ABNORMAL HIGH (ref 15–41)
Albumin: 3.5 g/dL (ref 3.5–5.0)
Alkaline Phosphatase: 61 U/L (ref 38–126)
Anion gap: 14 (ref 5–15)
BUN: 17 mg/dL (ref 6–20)
CO2: 22 mmol/L (ref 22–32)
Calcium: 8.9 mg/dL (ref 8.9–10.3)
Chloride: 103 mmol/L (ref 98–111)
Creatinine, Ser: 1.19 mg/dL (ref 0.61–1.24)
GFR, Estimated: >60 mL/min (ref >60)
Glucose, Bld: 75 mg/dL (ref 70–99)
Potassium: 3.7 mmol/L (ref 3.5–5.1)
Sodium: 139 mmol/L (ref 135–145)
Total Bilirubin: 0.7 mg/dL (ref 0.3–1.2)
Total Protein: 8.3 g/dL — ABNORMAL HIGH (ref 6.5–8.1)

## 2023-01-08 LAB — POC SARS CORONAVIRUS 2 AG: SARSCOV2ONAVIRUS 2 AG: NEGATIVE

## 2023-01-08 LAB — ETHANOL: Alcohol, Ethyl (B): 90 mg/dL — ABNORMAL HIGH (ref <10)

## 2023-01-08 MED ORDER — ALUM & MAG HYDROXIDE-SIMETH 200-200-20 MG/5ML PO SUSP: 30.0000 mL | ORAL | Status: DC | PRN

## 2023-01-08 MED ORDER — ACETAMINOPHEN 325 MG PO TABS: 650.0000 mg | ORAL_TABLET | Freq: Four times a day (QID) | ORAL | Status: DC | PRN

## 2023-01-08 MED ORDER — HYDROXYZINE HCL 25 MG PO TABS
25.0000 mg | ORAL_TABLET | Freq: Three times a day (TID) | ORAL | Status: DC | PRN
  Administered 2023-01-08: 25 mg via ORAL
  Filled 2023-01-08: qty 1

## 2023-01-08 MED ORDER — TRAZODONE HCL 50 MG PO TABS
50.0000 mg | ORAL_TABLET | Freq: Every evening | ORAL | Status: DC | PRN
  Administered 2023-01-08: 50 mg via ORAL
  Filled 2023-01-08: qty 1

## 2023-01-08 MED ORDER — MAGNESIUM HYDROXIDE 400 MG/5ML PO SUSP: 30.0000 mL | Freq: Every day | ORAL | Status: DC | PRN

## 2023-01-08 NOTE — ED Notes (Signed)
Pt resting at this time.  Breathing even and unlabored. Pt is in view of nursing station and being monitored.  Awaiting dispo from provider.

## 2023-01-08 NOTE — BH Assessment (Signed)
Comprehensive Clinical Assessment (CCA) Note  01/08/2023 William Powers [id number]  Disposition: William Barr, NP recommends pt to be admitted to [facility] for Continuous Assessment.   The patient demonstrates the following risk factors for suicide: Chronic risk factors for suicide include: psychiatric disorder of Major Depressive Disorder, recurrent, severe without psychotic features, substance use disorder, and previous suicide attempts Pt reports, previous suicide attempts, drinking bleach a couple years back . Acute risk factors for suicide include: social withdrawal/isolation, loss (financial, interpersonal, professional), and Pt was standing on the edge of a bridge with the intent to jump . Protective factors for this patient include: positive social support. Considering these factors, the overall suicide risk at this point appears to be high. Patient is not appropriate for outpatient follow up.  William Powers is a 42 year old male who presents voluntary and unaccompanied to [facility]. Clinician asked the pt, "what brought you to the hospital?" Pt reports, his grandmother died yesterday (she was sick)  and he's been depressed. Pt reports, he was calling people because he did not want to be alone but no one answered. Pt reports, he will attend her services in [place]. Pt reports, he went and stood on the edge of a bridge with the intent of jumping. Pt reports, he started seeing images of his grandmother and mothers' eyes and got down. Pt denies, HI, AVH, self-injurious behaviors and access to weapons.   Pt reports, he drank a gallon of [employer] (Brandy) 10 hours ago. Pt reports, he's not slept in 24 hours. Pt denies, being linked to OPT resources (medication management and/or counseling.) Pt has previous inpatient and [facility] admissions.   Pt presents alert in causal attire with normal speech. Pt's mood was euthymic. Pt's affect was congruent. Pt's insight was fair. Pt's judgement was poor. Pt  reports, if discharged he can not contract for safety.   Chief Complaint:  Chief Complaint  Patient presents with   Suicidal   Depression   Visit Diagnosis:  Major Depressive Disorder, recurrent, severe without psychotic features.                               Alcohol use Disorder, severe.    CCA Screening, Triage and Referral (STR)  Patient Reported Information How did you hear about [place]? Self  What Is the Reason for Your Visit/Call Today? Pt presents to [facility] voluntarily, unaccompanied at this time with complaint of depression and suicidal ideation with a plan to jump off of a bridge. Pt reports family issues and dealing with a major loss has caused him to feel depressed. Pt also reports drinking a few pints of liquor within the last 24 hours. Pt has had numerous ED visits within the last few months and has history of polysubstance abuse, depression, suicidal ideation, alcohol use disorder, malingering. Pt denies HI, AVH.  How Long Has This Been Causing You Problems? <Week  What Do You Feel Would Help You the Most Today? Treatment for Depression or other mood problem; Alcohol or Drug Use Treatment   Have You Recently Had Any Thoughts About Hurting Yourself? Yes  Are You Planning to Commit Suicide/Harm Yourself At This time? Yes   [place] ED from 01/07/2023 in [facility] ED from 01/05/2023 in [facility] ED from 01/02/2023 in [facility] Emergency Department at [place] CATEGORY High Risk High Risk High Risk  Have you Recently Had Thoughts About [place]? No  Are You Planning to Harm Someone at This Time? No  Explanation: Pt denies, HI.   Have You Used Any Alcohol or Drugs in the Past 24 Hours? Yes  What Did You Use and How Much? a few pints   Do You Currently Have a Therapist/Psychiatrist? No  Name of Therapist/Psychiatrist: Name of  Therapist/Psychiatrist: Pt denies, being linked to outpatient resources.   Have You Been Recently Discharged From Any Office Practice or Programs? Yes  Explanation of Discharge From Practice/Program: Pt has previous inpatient and observation admissions.     CCA Screening Triage Referral Assessment Type of Contact: Face-to-Face  Telemedicine Service Delivery: Telemedicine service delivery: This service was provided via telemedicine using a 2-way, interactive audio and video technology  Is this Initial or Reassessment? Is this Initial or Reassessment?: Initial Assessment  Date Telepsych consult ordered in [facility]:  Date Telepsych consult ordered in [facility]: 01/08/23  Time Telepsych consult ordered in [facility]L:  Time Telepsych consult ordered in CHL: 0000 (None.)  Location of Assessment: [facility] Assessment Services  Provider Location: [facility] Assessment Services   Collateral Involvement: None.   Does Patient Have a Stage manager Guardian? No  Legal Guardian Contact Information: Pt is his own guardian.  Copy of Legal Guardianship Form: No - copy requested  Legal Guardian Notified of Arrival: -- (Pt is his own guardian.)  Legal Guardian Notified of Pending Discharge: -- (Pt is his own guardian.)  If Minor and Not Living with Parent(s), Who has Custody? Pt is an adult, pt is his own guardian.  Is CPS involved or ever been involved? Never  Is APS involved or ever been involved? Never   Patient Determined To Be At Risk for Harm To Self or Others Based on Review of Patient Reported Information or Presenting Complaint? Yes, for Self-Harm  Method: Plan with intent and identified person (P reports, he was standing on the edge of a bridge with the plan of jumping off, but stopped.)  Availability of Means: Has close by  Intent: Clearly intends on inflicting harm that could cause death  Notification Required: No need or identified person  Additional Information for Danger to Others  Potential: -- (Pt denies, HI.)  Additional Comments for Danger to Others Potential: Pt denies, HI.  Are There Guns or Other Weapons in [place]? No  Types of Guns/Weapons: Pt denies, access to weapons.  Are These Weapons Safely Secured?                            -- (Pt denies, access to weapons.)  Who Could Verify You Are Able To Have These Secured: Pt denies, access to weapons.  Do You Have any Outstanding Charges, Pending Court Dates, Parole/Probation? Pt denies, legal involvement.  Contacted To Inform of Risk of Harm To Self or Others: Other: Comment (None.)    Does Patient Present under Involuntary Commitment? No    [place] of Residence: [place]   Patient Currently Receiving the Following Services: Not Receiving Services   Determination of Need: Urgent (48 hours)   Options For Referral: Chemical Dependency Intensive Outpatient Therapy (CDIOP); [facility] Urgent Care; Medication Management; Outpatient Therapy; Facility-Based Crisis     CCA Biopsychosocial Patient Reported Schizophrenia/Schizoaffective Diagnosis in Past: No   Strengths: Pt wanting to get better.   Mental Health Symptoms Depression:   Difficulty Concentrating; Fatigue; Sleep (too much or little); Increase/decrease in appetite;  Tearfulness (Isolation.)   Duration of Depressive symptoms:  Duration of Depressive Symptoms: Greater than two weeks   Mania:   None   Anxiety:    Worrying; Tension; Restlessness   Psychosis:   None   Duration of Psychotic symptoms:  Duration of Psychotic Symptoms: N/A   Trauma:   None   Obsessions:   None   Compulsions:   None   Inattention:   None   Hyperactivity/Impulsivity:   Feeling of restlessness   Oppositional/Defiant Behaviors:   Angry   Emotional Irregularity:   Recurrent suicidal behaviors/gestures/threats; Potentially harmful impulsivity   Other Mood/Personality Symptoms:   Depression    Mental Status Exam Appearance and self-care   Stature:   Average   Weight:   Average weight   Clothing:   Casual   Grooming:   Normal   Cosmetic use:   None   Posture/gait:   Normal   Motor activity:   Not Remarkable   Sensorium  Attention:   Normal   Concentration:   Normal   Orientation:   X5   Recall/memory:   Normal   Affect and Mood  Affect:   Congruent   Mood:   Euthymic   Relating  Eye contact:   Normal   Facial expression:   Responsive   Attitude toward examiner:   Cooperative   Thought and Language  Speech flow:  Normal   Thought content:   Appropriate to Mood and Circumstances   Preoccupation:   None   Hallucinations:   None   Organization:   Coherent   [employer] of Knowledge:   Fair   Intelligence:   Average   Abstraction:   Functional   Judgement:   Poor   Reality Testing:   Realistic   Insight:   Fair   Decision Making:   Impulsive   Social Functioning  Social Maturity:   Impulsive; Isolates   Social Judgement:   Heedless; "Street Smart"   Stress  Stressors:   Grief/losses   Coping Ability:   Programme researcher, broadcasting/film/video Deficits:   Decision making   Supports:   Family     Religion: Religion/Spirituality Are You A Religious Person?: Yes What is Your Religious Affiliation?: Christian How Might This Affect Treatment?: None.  Leisure/Recreation: Leisure / Recreation Do You Have Hobbies?: No  Exercise/Diet: Exercise/Diet Do You Exercise?: Yes What Type of Exercise Do You Do?: Other (Comment) (Going to the gym playing basketball.) How Many Times a Week Do You Exercise?:  (Two a month.) Have You Gained or Lost A Significant Amount of Weight in the Past Six Months?: No Do You Follow a Special Diet?: No Do You Have Any Trouble Sleeping?: Yes Explanation of Sleeping Difficulties: Pt reports, he's been up for 24 hours.   CCA Employment/Education Employment/Work Situation: Employment / Work Situation Employment  Situation: On disability Why is Patient on Disability: Heart failure. How Long has Patient Been on Disability: Since 34. Patient's Job has Been Impacted by Current Illness: No Has Patient ever Been in the [employer]?: No  Education: Education Is Patient Currently Attending School?: No Last Grade Completed: 12 Did You Attend College?: No Did You Have An Individualized Education Program (IIEP): No Did You Have Any Difficulty At School?: No Patient's Education Has Been Impacted by Current Illness: No   CCA Family/Childhood History Family and Relationship History: Family history Marital status: Single Divorced, when?: Pt reports, he's single. What types of issues is patient dealing with in the relationship?:  Pt reports, he's single. Additional relationship information: Pt reports, he's single. Does patient have children?: No  Childhood History:  Childhood History By whom was/is the patient raised?: Mother Did patient suffer any verbal/emotional/physical/sexual abuse as a child?: No Did patient suffer from severe childhood neglect?: No Has patient ever been sexually abused/assaulted/raped as an adolescent or adult?: No Was the patient ever a victim of a crime or a disaster?: No Witnessed domestic violence?: No Has patient been affected by domestic violence as an adult?: No       CCA Substance Use Alcohol/Drug Use: Alcohol / Drug Use Pain Medications: See MAR Prescriptions: See MAR Over the Counter: See MAR History of alcohol / drug use?: Yes Longest period of sobriety (when/how long): Unsure. Negative Consequences of Use: Personal relationships Withdrawal Symptoms: None Substance #1 Name of Substance 1: Alcohol. 1 - Age of First Use: Unsure. 1 - Amount (size/oz): Pt reports, he drank a gallon of [employer] (Brandy) 10 hours ago. 1 - Frequency: Pt reports, he drink everyday and skips 2-3 days during the week. 1 - Duration: Ongoing. 1 - Last Use / Amount: 01/07/2023. 1  - Method of Aquiring: Purchase. 1- Route of Use: Oral.    ASAM's:  Six Dimensions of Multidimensional Assessment  Dimension 1:  Acute Intoxication and/or Withdrawal Potential:   Dimension 1:  Description of individual's past and current experiences of substance use and withdrawal: None.  Dimension 2:  Biomedical Conditions and Complications:   Dimension 2:  Description of patient's biomedical conditions and  complications: Pt has the following diagnoses: History of CHF (congestive heart failure), HIV (human immunodeficiency virus infection) ([place]), HSV-2 (herpes simplex virus 2) infection. Pt reports, he's not currently seeing a Cardiologist regularly but on a as needed basis.  Dimension 3:  Emotional, Behavioral, or Cognitive Conditions and Complications:  Dimension 3:  Description of emotional, behavioral, or cognitive conditions and complications: Pt has the following diagnoses: Alcohol use disorder, severe, dependence ([place]), Recurrent major depressive episodes, moderate ([place]), Adjustment disorder with depressed mood.  Dimension 4:  Readiness to Change:  Dimension 4:  Description of Readiness to Change criteria: During the assessment pt did not disclose wanting to stop using substances.  Dimension 5:  Relapse, Continued use, or Continued Problem Potential:  Dimension 5:  Relapse, continued use, or continued problem potential critiera description: Pt has ongoing substance use.  Dimension 6:  Recovery/Living Environment:  Dimension 6:  Recovery/Iiving environment criteria description: Pt reports, he has a plan to stay, he wanted someone to stay with him but no one responded which triggered him to stand on the bridge.  ASAM Severity Score: ASAM's Severity Rating Score: 12  ASAM Recommended Level of Treatment: ASAM Recommended Level of Treatment: Level II Partial Hospitalization Treatment   Substance use Disorder (SUD) Substance Use Disorder (SUD)  Checklist Symptoms of Substance Use: Continued use  despite having a persistent/recurrent physical/psychological problem caused/exacerbated by use, Continued use despite persistent or recurrent social, interpersonal problems, caused or exacerbated by use, Evidence of tolerance  Recommendations for Services/Supports/Treatments: Recommendations for Services/Supports/Treatments Recommendations For Services/Supports/Treatments: Other (Comment) (Pt to be admitted to [facility] for Continuous Assessment.)  Discharge Disposition:  Discharge Disposition Medical Exam completed: Yes  DSM5 Diagnoses: Patient Active Problem List   Diagnosis Date Noted   Dental caries 09/13/2022   Substance abuse ([place]) 05/03/2022   Polysubstance abuse ([place]) 04/29/2022   Alcohol use disorder, severe, dependence ([place]) 03/16/2022   Recurrent major depressive episodes, moderate ([place]) 02/23/2022   Alcohol abuse  Suicidal ideation    Vitamin B1 deficiency 09/19/2021   Adjustment disorder with depressed mood    History of CHF (congestive heart failure) 08/07/2021   H/O ETOH abuse 08/07/2021   Chronic HFrEF (heart failure with reduced ejection fraction) ([place]) 07/23/2021   HIV (human immunodeficiency virus infection) ([place]) 07/23/2021   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   Tinea versicolor 09/17/2018   History of tobacco abuse 09/16/2018   Dilated cardiomyopathy ([place]) 09/13/2018     Referrals to Alternative Service(s): Referred to Alternative Service(s):   Place:   Date:   Time:    Referred to Alternative Service(s):   Place:   Date:   Time:    Referred to Alternative Service(s):   Place:   Date:   Time:    Referred to Alternative Service(s):   Place:   Date:   Time:     Vertell Novak, [facility] Comprehensive Clinical Assessment (CCA) Screening, Triage and Referral Note  01/08/2023 Khol Heinle [id number]  Chief Complaint:  Chief Complaint  Patient presents with   Suicidal   Depression   Visit Diagnosis:   Patient Reported  Information How did you hear about [place]? Self  What Is the Reason for Your Visit/Call Today? Pt presents to [facility] voluntarily, unaccompanied at this time with complaint of depression and suicidal ideation with a plan to jump off of a bridge. Pt reports family issues and dealing with a major loss has caused him to feel depressed. Pt also reports drinking a few pints of liquor within the last 24 hours. Pt has had numerous ED visits within the last few months and has history of polysubstance abuse, depression, suicidal ideation, alcohol use disorder, malingering. Pt denies HI, AVH.  How Long Has This Been Causing You Problems? <Week  What Do You Feel Would Help You the Most Today? Treatment for Depression or other mood problem; Alcohol or Drug Use Treatment   Have You Recently Had Any Thoughts About Hurting Yourself? Yes  Are You Planning to Commit Suicide/Harm Yourself At This time? Yes   Have you Recently Had Thoughts About Hurting Someone [place]? No  Are You Planning to Harm Someone at This Time? No  Explanation: Pt denies, HI.   Have You Used Any Alcohol or Drugs in the Past 24 Hours? Yes  How Long Ago Did You Use Drugs or Alcohol? Pt reports, 10 hours ago. What Did You Use and How Much? a few pints   Do You Currently Have a Therapist/Psychiatrist? No  Name of Therapist/Psychiatrist: Pt denies, being linked to outpatient resources.   Have You Been Recently Discharged From Any Office Practice or Programs? Yes  Explanation of Discharge From Practice/Program: Pt has previous inpatient and observation admissions.    CCA Screening Triage Referral Assessment Type of Contact: Face-to-Face  Telemedicine Service Delivery: Telemedicine service delivery: This service was provided via telemedicine using a 2-way, interactive audio and video technology  Is this Initial or Reassessment? Is this Initial or Reassessment?: Initial Assessment  Date Telepsych consult ordered in [facility]:  Date  Telepsych consult ordered in [facility]: 01/08/23  Time Telepsych consult ordered in [facility]:  Time Telepsych consult ordered in CHL: 0000 (None.)  Location of Assessment: [facility] Assessment Services  Provider Location: [facility] Assessment Services    Collateral Involvement: None.   Does Patient Have a Stage manager Guardian? No. Name and Contact of Legal Guardian: Pt is his own guardian.  If Minor and Not Living with Parent(s), Who has  Custody? Pt is an adult, pt is his own guardian.  Is CPS involved or ever been involved? Never  Is APS involved or ever been involved? Never   Patient Determined To Be At Risk for Harm To Self or Others Based on Review of Patient Reported Information or Presenting Complaint? Yes, for Self-Harm  Method: Plan with intent and identified person (P reports, he was standing on the edge of a bridge with the plan of jumping off, but stopped.)  Availability of Means: Has close by  Intent: Clearly intends on inflicting harm that could cause death  Notification Required: No need or identified person  Additional Information for Danger to Others Potential: -- (Pt denies, HI.)  Additional Comments for Danger to Others Potential: Pt denies, HI.  Are There Guns or Other Weapons in [place]? No  Types of Guns/Weapons: Pt denies, access to weapons.  Are These Weapons Safely Secured?                            -- (Pt denies, access to weapons.)  Who Could Verify You Are Able To Have These Secured: Pt denies, access to weapons.  Do You Have any Outstanding Charges, Pending Court Dates, Parole/Probation? Pt denies, legal involvement.  Contacted To Inform of Risk of Harm To Self or Others: Other: Comment (None.)   Does Patient Present under Involuntary Commitment? No    [place] of Residence: [place]   Patient Currently Receiving the Following Services: Not Receiving Services   Determination of Need: Urgent (48 hours)   Options For Referral: Chemical  Dependency Intensive Outpatient Therapy (CDIOP); [facility] Urgent Care; Medication Management; Outpatient Therapy; Facility-Based Crisis   Discharge Disposition:  Discharge Disposition Medical Exam completed: Yes  Vertell Novak, [place], [place], [facility], [facility] Triage Specialist [phone]

## 2023-01-08 NOTE — ED Notes (Signed)
Patient setting on bed consuming snack as requested(did not receive snack upon arriving on unit.) Patient verbalizes no complaints at this time and appears in no distress. Will continue to monitor.

## 2023-01-08 NOTE — ED Provider Notes (Signed)
[facility] Continuous Assessment Admission H&P  Date: 01/08/23 Patient Name: William Powers MRN: [id number] Chief Complaint: "I've had a tragic loss in my family and I'm feeling suicidal"  Diagnoses:  Final diagnoses:  Severe episode of recurrent major depressive disorder, without psychotic features ([place])  Suicidal ideation  Sheltered homelessness    HPI: William Powers is a 42 year old male with with a psychiatric history of polysubstance abuse, adjustment disorder with depressed mood, depression, suicidal ideation, alcohol use disorder, and malingering, who presented voluntarily as a walk-in to the [facility] with a complaint of suicidal ideation with a plan to jump off a bridge.   Patient was seen face-to-face by this provider and chart reviewed. Patient is well-known to the behavioral health service line, and has had 18 ED visits in the past 6 months. "Per chart review, patient has a history of presenting to the ED with complaint of chest pain or shortness of breath and suicidal ideation. Later in the morning patient will ask for discharge saying he is ready to leave".   Patient is homeless and reports "sometimes I live in motels and I used to stay in [place] periodically, and I go back and forth, and I don't have any stable address and I use my aunt's address for my mails".   Tonight, patient reports "I've had a tragic loss in my family, I lost my grandparents yesterday, it's actually my grandma, my grandpa died a long time ago. I got a call, and I'm feeling suicidal and I was actually on the bridge at [employer] ready to jump off and I thought to come here before I did it, so I came by bus".  Patient endorses history of multiple suicide attempts with lots of depression. When asked about his depressive symptoms, patient reports "I don't know, I'm just depressed about different things that happened to my life, it weighs me down, I tried to stay by myself tonight and I thought no,  because I was feeling unsafe, I just need some help real quick to get my thoughts together".  Patient endorses alcohol use, and reports he drank a gallon of liquor 10 hours ago. Patient reports "I drink daily unless I'm tired and need a couple of days to rest, I might take 2 days off, but I mainly drink daily". Patient denies current withdrawal symptoms.   Patient endorses poor sleep and appetite.  He reports he is not established with outpatient psychiatric services and is not taking any psychiatric medications.  Support, encouragement and reassurance provided about ongoing stressors. Patient is provided with opportunity for questions.   On evaluation, patient is alert, oriented x 3, and cooperative. Speech is clear, normal rate and coherent. Pt appears well groomed. Eye contact is fair. Mood is euthymic, affect is congruent with mood. Thought process is coherent and thought content is WDL. Pt endorses SI with plan, denies HI/AVH. There is no objective indication that the patient is responding to internal stimuli. No delusions elicited during this assessment.    Total Time spent with patient: 20 minutes  Musculoskeletal  Strength & Muscle Tone: within normal limits Gait & Station: normal Patient leans: N/A  Psychiatric Specialty Exam  Presentation General Appearance:  Appropriate for Environment  Eye Contact: Fair  Speech: Clear and Coherent  Speech Volume: Normal  Handedness: Right   Mood and Affect  Mood: Euthymic  Affect: Congruent   Thought Process  Thought Processes: Goal Directed  Descriptions of Associations:Circumstantial  Orientation:Full (Time, Place  and Person)  Thought Content:WDL  Diagnosis of Schizophrenia or Schizoaffective disorder in past: No   Hallucinations:Hallucinations: None  Ideas of Reference:None  Suicidal Thoughts:Suicidal Thoughts: Yes, Active SI Active Intent and/or Plan: With Plan  Homicidal Thoughts:Homicidal Thoughts:  No   Sensorium  Memory: Immediate Fair  Judgment: Fair  Insight: Present   Executive Functions  Concentration: Good  Attention Span: Good  Recall: Good  Fund of Knowledge: Good  Language: Good   Psychomotor Activity  Psychomotor Activity: Psychomotor Activity: Normal   Assets  Assets: Communication Skills; Desire for Improvement; Resilience   Sleep  Sleep: Sleep: Poor   Nutritional Assessment (For OBS and FBC admissions only) Has the patient had a weight loss or gain of 10 pounds or more in the last 3 months?: No Has the patient had a decrease in food intake/or appetite?: No Does the patient have dental problems?: No Does the patient have eating habits or behaviors that may be indicators of an eating disorder including binging or inducing vomiting?: No Has the patient recently lost weight without trying?: 0 Has the patient been eating poorly because of a decreased appetite?: 0 Malnutrition Screening Tool Score: 0    Physical Exam Constitutional:      General: He is not in acute distress.    Appearance: He is not diaphoretic.  HENT:     Head: Normocephalic.     Right Ear: External ear normal.     Left Ear: External ear normal.     Nose: No congestion.  Eyes:     General:        Right eye: No discharge.        Left eye: No discharge.  Cardiovascular:     Rate and Rhythm: Tachycardia present.  Pulmonary:     Effort: No respiratory distress.  Chest:     Chest wall: No tenderness.  Neurological:     Mental Status: He is alert and oriented to person, place, and time.  Psychiatric:        Attention and Perception: Attention and perception normal.        Mood and Affect: Mood and affect normal.        Speech: Speech normal.        Behavior: Behavior is cooperative.        Thought Content: Thought content is not paranoid or delusional. Thought content includes suicidal ideation. Thought content does not include homicidal ideation. Thought  content includes suicidal plan. Thought content does not include homicidal plan.        Cognition and Memory: Cognition and memory normal.    Review of Systems  Constitutional:  Negative for chills, diaphoresis and fever.  HENT:  Negative for congestion.   Eyes:  Negative for discharge.  Respiratory:  Negative for cough, shortness of breath and wheezing.   Cardiovascular:  Negative for chest pain and palpitations.  Gastrointestinal:  Negative for diarrhea, nausea and vomiting.  Neurological:  Negative for dizziness, seizures, loss of consciousness, weakness and headaches.  Psychiatric/Behavioral:  Positive for suicidal ideas.     Blood pressure 105/73, pulse (!) 107, temperature 99.1 F (37.3 C), temperature source Oral, resp. rate 20, SpO2 100 %. There is no height or weight on file to calculate BMI.  Past Psychiatric History: See H & P   Is the patient at risk to self? Yes  Has the patient been a risk to self in the past 6 months? Yes .    Has the patient been a risk  to self within the distant past? Yes   Is the patient a risk to others? No   Has the patient been a risk to others in the past 6 months? No   Has the patient been a risk to others within the distant past? No   Past Medical History: See chart  Family History: N/A  Social History: N/A  Last Labs:  Admission on 01/07/2023  Component Date Value Ref Range Status   SARSCOV2ONAVIRUS 2 AG 01/08/2023 NEGATIVE  NEGATIVE Final   Comment: (NOTE) SARS-CoV-2 antigen NOT DETECTED.   Negative results are presumptive.  Negative results do not preclude SARS-CoV-2 infection and should not be used as the sole basis for treatment or other patient management decisions, including infection  control decisions, particularly in the presence of clinical signs and  symptoms consistent with COVID-19, or in those who have been in contact with the virus.  Negative results must be combined with clinical observations, patient history, and  epidemiological information. The expected result is Negative.  Fact Sheet for Patients: HandmadeRecipes.com.cy  Fact Sheet for Healthcare Providers: FuneralLife.at  This test is not yet approved or cleared by the [place] FDA and  has been authorized for detection and/or diagnosis of SARS-CoV-2 by FDA under an Emergency Use Authorization (EUA).  This EUA will remain in effect (meaning this test can be used) for the duration of  the COV                          ID-19 declaration under Section 564(b)(1) of the Act, 21 U.S.C. section 360bbb-3(b)(1), unless the authorization is terminated or revoked sooner.    Admission on 01/05/2023, Discharged on 01/06/2023  Component Date Value Ref Range Status   WBC 01/06/2023 3.3 (L)  4.0 - 10.5 K/uL Final   RBC 01/06/2023 4.40  4.22 - 5.81 MIL/uL Final   Hemoglobin 01/06/2023 14.1  13.0 - 17.0 g/dL Final   HCT 01/06/2023 40.6  39.0 - 52.0 % Final   MCV 01/06/2023 92.3  80.0 - 100.0 fL Final   MCH 01/06/2023 32.0  26.0 - 34.0 pg Final   MCHC 01/06/2023 34.7  30.0 - 36.0 g/dL Final   RDW 01/06/2023 14.1  11.5 - 15.5 % Final   Platelets 01/06/2023 278  150 - 400 K/uL Final   nRBC 01/06/2023 0.0  0.0 - 0.2 % Final   Neutrophils Relative % 01/06/2023 52  % Final   Neutro Abs 01/06/2023 1.7  1.7 - 7.7 K/uL Final   Lymphocytes Relative 01/06/2023 36  % Final   Lymphs Abs 01/06/2023 1.2  0.7 - 4.0 K/uL Final   Monocytes Relative 01/06/2023 9  % Final   Monocytes Absolute 01/06/2023 0.3  0.1 - 1.0 K/uL Final   Eosinophils Relative 01/06/2023 2  % Final   Eosinophils Absolute 01/06/2023 0.1  0.0 - 0.5 K/uL Final   Basophils Relative 01/06/2023 1  % Final   Basophils Absolute 01/06/2023 0.0  0.0 - 0.1 K/uL Final   Immature Granulocytes 01/06/2023 0  % Final   Abs Immature Granulocytes 01/06/2023 0.01  0.00 - 0.07 K/uL Final   Performed at [facility], [place] [address], [place], [place] [address]    Sodium 01/06/2023 136  135 - 145 mmol/L Final   Potassium 01/06/2023 3.1 (L)  3.5 - 5.1 mmol/L Final   Chloride 01/06/2023 102  98 - 111 mmol/L Final   CO2 01/06/2023 19 (L)  22 - 32  mmol/L Final   Glucose, Bld 01/06/2023 89  70 - 99 mg/dL Final   Glucose reference range applies only to samples taken after fasting for at least 8 hours.   BUN 01/06/2023 10  6 - 20 mg/dL Final   Creatinine, Ser 01/06/2023 1.03  0.61 - 1.24 mg/dL Final   Calcium 01/06/2023 8.6 (L)  8.9 - 10.3 mg/dL Final   Total Protein 01/06/2023 8.6 (H)  6.5 - 8.1 g/dL Final   Albumin 01/06/2023 3.7  3.5 - 5.0 g/dL Final   AST 01/06/2023 38  15 - 41 U/L Final   ALT 01/06/2023 32  0 - 44 U/L Final   Alkaline Phosphatase 01/06/2023 54  38 - 126 U/L Final   Total Bilirubin 01/06/2023 0.7  0.3 - 1.2 mg/dL Final   GFR, Estimated 01/06/2023 >60  >60 mL/min Final   Comment: (NOTE) Calculated using the CKD-EPI Creatinine Equation (2021)    Anion gap 01/06/2023 15  5 - 15 Final   Performed at [facility] [address] [place], [place] [address]   Alcohol, Ethyl (B) 01/06/2023 128 (H)  <10 mg/dL Final   Comment: (NOTE) Lowest detectable limit for serum alcohol is 10 mg/dL.  For medical purposes only. Performed at [facility], [place] [address] [place], [place] [address]    Troponin I (High Sensitivity) 01/06/2023 6  <18 ng/L Final   Comment: (NOTE) Elevated high sensitivity troponin I (hsTnI) values and significant  changes across serial measurements may suggest ACS but many other  chronic and acute conditions are known to elevate hsTnI results.  Refer to the "Links" section for chest pain algorithms and additional  guidance. Performed at [facility], [place] [address], [place], [place] [address]    B Natriuretic Peptide 01/06/2023 14.7  0.0 - 100.0 pg/mL Final   Performed at [facility] [address], [place], [place] [address]   Troponin I (High Sensitivity) 01/06/2023 6  <18 ng/L Final    Comment: (NOTE) Elevated high sensitivity troponin I (hsTnI) values and significant  changes across serial measurements may suggest ACS but many other  chronic and acute conditions are known to elevate hsTnI results.  Refer to the "Links" section for chest pain algorithms and additional  guidance. Performed at [facility], [place] [address] [place], [place] [address]   Admission on 01/02/2023, Discharged on 01/02/2023  Component Date Value Ref Range Status   Sodium 01/02/2023 136  135 - 145 mmol/L Final   Potassium 01/02/2023 4.3  3.5 - 5.1 mmol/L Final   HEMOLYSIS AT THIS LEVEL MAY AFFECT RESULT   Chloride 01/02/2023 104  98 - 111 mmol/L Final   CO2 01/02/2023 18 (L)  22 - 32 mmol/L Final   Glucose, Bld 01/02/2023 95  70 - 99 mg/dL Final   Glucose reference range applies only to samples taken after fasting for at least 8 hours.   BUN 01/02/2023 11  6 - 20 mg/dL Final   Creatinine, Ser 01/02/2023 1.10  0.61 - 1.24 mg/dL Final   Calcium 01/02/2023 8.6 (L)  8.9 - 10.3 mg/dL Final   Total Protein 01/02/2023 8.2 (H)  6.5 - 8.1 g/dL Final   Albumin 01/02/2023 3.5  3.5 - 5.0 g/dL Final   AST 01/02/2023 50 (H)  15 - 41 U/L Final   HEMOLYSIS AT THIS LEVEL MAY AFFECT RESULT   ALT 01/02/2023 29  0 - 44 U/L Final   HEMOLYSIS AT THIS  LEVEL MAY AFFECT RESULT   Alkaline Phosphatase 01/02/2023 50  38 - 126 U/L Final   HEMOLYSIS AT THIS LEVEL MAY AFFECT RESULT   Total Bilirubin 01/02/2023 1.8 (H)  0.3 - 1.2 mg/dL Final   HEMOLYSIS AT THIS LEVEL MAY AFFECT RESULT   GFR, Estimated 01/02/2023 >60  >60 mL/min Final   Comment: (NOTE) Calculated using the CKD-EPI Creatinine Equation (2021)    Anion gap 01/02/2023 14  5 - 15 Final   Performed at [facility], [place] [address] [place], [place] [address]   Alcohol, Ethyl (B) 01/02/2023 112 (H)  <10 mg/dL Final   Comment: (NOTE) Lowest detectable limit for serum alcohol is 10 mg/dL.  For medical purposes only. Performed at [facility], [place] [address] [place], [place] [id number]    Salicylate Lvl XX123456 <7.0 (L)  7.0 - 30.0 mg/dL Final   Performed at [facility] [address] [place], [place] [address]   Acetaminophen (Tylenol), Serum 01/02/2023 <10 (L)  10 - 30 ug/mL Final   Comment: (NOTE) Therapeutic concentrations vary significantly. A range of 10-30 ug/mL  may be an effective concentration for many patients. However, some  are best treated at concentrations outside of this range. Acetaminophen concentrations >150 ug/mL at 4 hours after ingestion  and >50 ug/mL at 12 hours after ingestion are often associated with  toxic reactions.  Performed at [facility], [place] [address] [place], [place] [address]    WBC 01/02/2023 3.6 (L)  4.0 - 10.5 K/uL Final   RBC 01/02/2023 4.33  4.22 - 5.81 MIL/uL Final   Hemoglobin 01/02/2023 13.5  13.0 - 17.0 g/dL Final   HCT 01/02/2023 40.7  39.0 - 52.0 % Final   MCV 01/02/2023 94.0  80.0 - 100.0 fL Final   MCH 01/02/2023 31.2  26.0 - 34.0 pg Final   MCHC 01/02/2023 33.2  30.0 - 36.0 g/dL Final   RDW 01/02/2023 14.6  11.5 - 15.5 % Final   Platelets 01/02/2023 293  150 - 400 K/uL Final   nRBC 01/02/2023 0.0  0.0 - 0.2 % Final   Performed at [facility] [name], [place], [place] [address]   Opiates 01/02/2023 NONE DETECTED  NONE DETECTED Final   Cocaine 01/02/2023 NONE DETECTED  NONE DETECTED Final   Benzodiazepines 01/02/2023 NONE DETECTED  NONE DETECTED Final   Amphetamines 01/02/2023 NONE DETECTED  NONE DETECTED Final   Tetrahydrocannabinol 01/02/2023 NONE DETECTED  NONE DETECTED Final   Barbiturates 01/02/2023 NONE DETECTED  NONE DETECTED Final   Comment: (NOTE) DRUG SCREEN FOR MEDICAL PURPOSES ONLY.  IF CONFIRMATION IS NEEDED FOR ANY PURPOSE, NOTIFY LAB WITHIN 5 DAYS.  LOWEST DETECTABLE LIMITS FOR URINE DRUG SCREEN Drug Class                     Cutoff (ng/mL) Amphetamine and metabolites    1000 Barbiturate and metabolites     200 Benzodiazepine                 200 Opiates and metabolites        300 Cocaine and metabolites        300 THC                            50 Performed at [facility], [place] [address], [place], [place] [address]    Glucose-Capillary 01/02/2023 111 (  H)  70 - 99 mg/dL Final   Glucose reference range applies only to samples taken after fasting for at least 8 hours.   Acetaminophen (Tylenol), Serum 01/02/2023 <10 (L)  10 - 30 ug/mL Final   Comment: (NOTE) Therapeutic concentrations vary significantly. A range of 10-30 ug/mL  may be an effective concentration for many patients. However, some  are best treated at concentrations outside of this range. Acetaminophen concentrations >150 ug/mL at 4 hours after ingestion  and >50 ug/mL at 12 hours after ingestion are often associated with  toxic reactions.  Performed at [facility], [place] [address] [place], [place] [id number]    Salicylate Lvl [id number] <7.0 (L)  7.0 - 30.0 mg/dL Final   Comment: HEMOLYSIS AT THIS LEVEL MAY AFFECT RESULT Performed at [facility], [place] [address] [place], [place] [address]   Admission on 12/29/2022, Discharged on 12/29/2022  Component Date Value Ref Range Status   Sodium 12/29/2022 134 (L)  135 - 145 mmol/L Final   Potassium 12/29/2022 3.3 (L)  3.5 - 5.1 mmol/L Final   Chloride 12/29/2022 104  98 - 111 mmol/L Final   CO2 12/29/2022 21 (L)  22 - 32 mmol/L Final   Glucose, Bld 12/29/2022 105 (H)  70 - 99 mg/dL Final   Glucose reference range applies only to samples taken after fasting for at least 8 hours.   BUN 12/29/2022 8  6 - 20 mg/dL Final   Creatinine, Ser 12/29/2022 1.01  0.61 - 1.24 mg/dL Final   Calcium 12/29/2022 8.5 (L)  8.9 - 10.3 mg/dL Final   Total Protein 12/29/2022 9.1 (H)  6.5 - 8.1 g/dL Final   Albumin 12/29/2022 4.0  3.5 - 5.0 g/dL Final   AST 12/29/2022 33  15 - 41 U/L Final   ALT 12/29/2022 28  0 - 44 U/L Final   Alkaline Phosphatase 12/29/2022 52  38 - 126 U/L  Final   Total Bilirubin 12/29/2022 0.8  0.3 - 1.2 mg/dL Final   GFR, Estimated 12/29/2022 >60  >60 mL/min Final   Comment: (NOTE) Calculated using the CKD-EPI Creatinine Equation (2021)    Anion gap 12/29/2022 9  5 - 15 Final   Performed at [facility], [place] [address], [place], [place] [address]   Alcohol, Ethyl (B) 12/29/2022 113 (H)  <10 mg/dL Final   Comment: (NOTE) Lowest detectable limit for serum alcohol is 10 mg/dL.  For medical purposes only. Performed at [facility], [place] [address] [place], [place] [address]    WBC 12/29/2022 3.2 (L)  4.0 - 10.5 K/uL Final   RBC 12/29/2022 4.41  4.22 - 5.81 MIL/uL Final   Hemoglobin 12/29/2022 13.8  13.0 - 17.0 g/dL Final   HCT 12/29/2022 40.8  39.0 - 52.0 % Final   MCV 12/29/2022 92.5  80.0 - 100.0 fL Final   MCH 12/29/2022 31.3  26.0 - 34.0 pg Final   MCHC 12/29/2022 33.8  30.0 - 36.0 g/dL Final   RDW 12/29/2022 14.6  11.5 - 15.5 % Final   Platelets 12/29/2022 300  150 - 400 K/uL Final   nRBC 12/29/2022 0.0  0.0 - 0.2 % Final   Neutrophils Relative % 12/29/2022 50  % Final   Neutro Abs 12/29/2022 1.6 (L)  1.7 - 7.7 K/uL Final   Lymphocytes Relative 12/29/2022 35  % Final   Lymphs Abs 12/29/2022 1.1  0.7 - 4.0 K/uL Final   Monocytes Relative 12/29/2022 10  %  Final   Monocytes Absolute 12/29/2022 0.3  0.1 - 1.0 K/uL Final   Eosinophils Relative 12/29/2022 3  % Final   Eosinophils Absolute 12/29/2022 0.1  0.0 - 0.5 K/uL Final   Basophils Relative 12/29/2022 1  % Final   Basophils Absolute 12/29/2022 0.0  0.0 - 0.1 K/uL Final   Immature Granulocytes 12/29/2022 1  % Final   Abs Immature Granulocytes 12/29/2022 0.02  0.00 - 0.07 K/uL Final   Reactive, Benign Lymphocytes 12/29/2022 PRESENT   Final   Performed at [facility], [place] [address], [place], [place] [address]   SARS Coronavirus 2 by RT PCR 12/29/2022 NEGATIVE  NEGATIVE Final   Comment: (NOTE) SARS-CoV-2 target nucleic  acids are NOT DETECTED.  The SARS-CoV-2 RNA is generally detectable in upper respiratory specimens during the acute phase of infection. The lowest concentration of SARS-CoV-2 viral copies this assay can detect is 138 copies/mL. A negative result does not preclude SARS-Cov-2 infection and should not be used as the sole basis for treatment or other patient management decisions. A negative result may occur with  improper specimen collection/handling, submission of specimen other than nasopharyngeal swab, presence of viral mutation(s) within the areas targeted by this assay, and inadequate number of viral copies(<138 copies/mL). A negative result must be combined with clinical observations, patient history, and epidemiological information. The expected result is Negative.  Fact Sheet for Patients:  EntrepreneurPulse.com.au  Fact Sheet for Healthcare Providers:  IncredibleEmployment.be  This test is no                          t yet approved or cleared by the [place] FDA and  has been authorized for detection and/or diagnosis of SARS-CoV-2 by FDA under an Emergency Use Authorization (EUA). This EUA will remain  in effect (meaning this test can be used) for the duration of the COVID-19 declaration under Section 564(b)(1) of the Act, 21 U.S.C.section 360bbb-3(b)(1), unless the authorization is terminated  or revoked sooner.       Influenza A by PCR 12/29/2022 NEGATIVE  NEGATIVE Final   Influenza B by PCR 12/29/2022 NEGATIVE  NEGATIVE Final   Comment: (NOTE) The Xpert Xpress SARS-CoV-2/FLU/RSV plus assay is intended as an aid in the diagnosis of influenza from Nasopharyngeal swab specimens and should not be used as a sole basis for treatment. Nasal washings and aspirates are unacceptable for Xpert Xpress SARS-CoV-2/FLU/RSV testing.  Fact Sheet for Patients: EntrepreneurPulse.com.au  Fact Sheet for Healthcare  Providers: IncredibleEmployment.be  This test is not yet approved or cleared by the [place] FDA and has been authorized for detection and/or diagnosis of SARS-CoV-2 by FDA under an Emergency Use Authorization (EUA). This EUA will remain in effect (meaning this test can be used) for the duration of the COVID-19 declaration under Section 564(b)(1) of the Act, 21 U.S.C. section 360bbb-3(b)(1), unless the authorization is terminated or revoked.     Resp Syncytial Virus by PCR 12/29/2022 NEGATIVE  NEGATIVE Final   Comment: (NOTE) Fact Sheet for Patients: EntrepreneurPulse.com.au  Fact Sheet for Healthcare Providers: IncredibleEmployment.be  This test is not yet approved or cleared by the [place] FDA and has been authorized for detection and/or diagnosis of SARS-CoV-2 by FDA under an Emergency Use Authorization (EUA). This EUA will remain in effect (meaning this test can be used) for the duration of the COVID-19 declaration under Section 564(b)(1) of the Act, 21 U.S.C. section 360bbb-3(b)(1), unless the authorization is terminated or revoked.  Performed at [facility], [place] [address], [place], [place] [address]   Admission on 12/24/2022, Discharged on 12/24/2022  Component Date Value Ref Range Status   Sodium 12/24/2022 136  135 - 145 mmol/L Final   Potassium 12/24/2022 3.6  3.5 - 5.1 mmol/L Final   Chloride 12/24/2022 108  98 - 111 mmol/L Final   CO2 12/24/2022 19 (L)  22 - 32 mmol/L Final   Glucose, Bld 12/24/2022 83  70 - 99 mg/dL Final   Glucose reference range applies only to samples taken after fasting for at least 8 hours.   BUN 12/24/2022 19  6 - 20 mg/dL Final   Creatinine, Ser 12/24/2022 1.01  0.61 - 1.24 mg/dL Final   Calcium 12/24/2022 8.1 (L)  8.9 - 10.3 mg/dL Final   GFR, Estimated 12/24/2022 >60  >60 mL/min Final   Comment: (NOTE) Calculated using the CKD-EPI Creatinine Equation  (2021)    Anion gap 12/24/2022 9  5 - 15 Final   Performed at [facility], [place] [address] [place], [place] [address]   WBC 12/24/2022 3.0 (L)  4.0 - 10.5 K/uL Final   RBC 12/24/2022 4.14 (L)  4.22 - 5.81 MIL/uL Final   Hemoglobin 12/24/2022 12.8 (L)  13.0 - 17.0 g/dL Final   HCT 12/24/2022 38.8 (L)  39.0 - 52.0 % Final   MCV 12/24/2022 93.7  80.0 - 100.0 fL Final   MCH 12/24/2022 30.9  26.0 - 34.0 pg Final   MCHC 12/24/2022 33.0  30.0 - 36.0 g/dL Final   RDW 12/24/2022 14.6  11.5 - 15.5 % Final   Platelets 12/24/2022 296  150 - 400 K/uL Final   nRBC 12/24/2022 0.0  0.0 - 0.2 % Final   Performed at [facility], [place] [address] [place], [place] [address]   Troponin I (High Sensitivity) 12/24/2022 6  <18 ng/L Final   Comment: (NOTE) Elevated high sensitivity troponin I (hsTnI) values and significant  changes across serial measurements may suggest ACS but many other  chronic and acute conditions are known to elevate hsTnI results.  Refer to the "Links" section for chest pain algorithms and additional  guidance. Performed at [facility], [place] [address] [place], [place] [address]    Alcohol, Ethyl (B) 12/24/2022 94 (H)  <10 mg/dL Final   Comment: (NOTE) Lowest detectable limit for serum alcohol is 10 mg/dL.  For medical purposes only. Performed at [facility], [place] [address] [place], [place] [address]    Lipase 12/24/2022 35  11 - 51 U/L Final   Performed at [facility], [place] [address] [place], [place] [address]   Opiates 12/24/2022 NONE DETECTED  NONE DETECTED Final   Cocaine 12/24/2022 NONE DETECTED  NONE DETECTED Final   Benzodiazepines 12/24/2022 NONE DETECTED  NONE DETECTED Final   Amphetamines 12/24/2022 NONE DETECTED  NONE DETECTED Final   Tetrahydrocannabinol 12/24/2022 POSITIVE (A)  NONE DETECTED Final   Barbiturates 12/24/2022 NONE DETECTED  NONE DETECTED Final    Comment: (NOTE) DRUG SCREEN FOR MEDICAL PURPOSES ONLY.  IF CONFIRMATION IS NEEDED FOR ANY PURPOSE, NOTIFY LAB WITHIN 5 DAYS.  LOWEST DETECTABLE LIMITS FOR URINE DRUG SCREEN Drug Class                     Cutoff (ng/mL) Amphetamine and metabolites    1000 Barbiturate and metabolites    200 Benzodiazepine  200 Opiates and metabolites        300 Cocaine and metabolites        300 THC                            50 Performed at [place] [address], [place], [place] [address]    D-Dimer, America Brown 12/24/2022 <0.27  0.00 - 0.50 ug/mL-FEU Final   Comment: (NOTE) At the manufacturer cut-off value of 0.5 g/mL FEU, this assay has a negative predictive value of 95-100%.This assay is intended for use in conjunction with a clinical pretest probability (PTP) assessment model to exclude pulmonary embolism (PE) and deep venous thrombosis (DVT) in outpatients suspected of PE or DVT. Results should be correlated with clinical presentation. Performed at [facility], [place] [address] [place], [place] [address]    Troponin I (High Sensitivity) 12/24/2022 6  <18 ng/L Final   Comment: (NOTE) Elevated high sensitivity troponin I (hsTnI) values and significant  changes across serial measurements may suggest ACS but many other  chronic and acute conditions are known to elevate hsTnI results.  Refer to the "Links" section for chest pain algorithms and additional  guidance. Performed at [facility], [place] [address], [place], [place] [address]   Admission on 12/21/2022, Discharged on 12/22/2022  Component Date Value Ref Range Status   SARS Coronavirus 2 by RT PCR 12/22/2022 NEGATIVE  NEGATIVE Final   Influenza A by PCR 12/22/2022 NEGATIVE  NEGATIVE Final   Influenza B by PCR 12/22/2022 NEGATIVE  NEGATIVE Final   Comment: (NOTE) The Xpert Xpress SARS-CoV-2/FLU/RSV plus assay is intended as an aid in the diagnosis of  influenza from Nasopharyngeal swab specimens and should not be used as a sole basis for treatment. Nasal washings and aspirates are unacceptable for Xpert Xpress SARS-CoV-2/FLU/RSV testing.  Fact Sheet for Patients: EntrepreneurPulse.com.au  Fact Sheet for Healthcare Providers: IncredibleEmployment.be  This test is not yet approved or cleared by the [place] FDA and has been authorized for detection and/or diagnosis of SARS-CoV-2 by FDA under an Emergency Use Authorization (EUA). This EUA will remain in effect (meaning this test can be used) for the duration of the COVID-19 declaration under Section 564(b)(1) of the Act, 21 U.S.C. section 360bbb-3(b)(1), unless the authorization is terminated or revoked.     Resp Syncytial Virus by PCR 12/22/2022 NEGATIVE  NEGATIVE Final   Comment: (NOTE) Fact Sheet for Patients: EntrepreneurPulse.com.au  Fact Sheet for Healthcare Providers: IncredibleEmployment.be  This test is not yet approved or cleared by the [place] FDA and has been authorized for detection and/or diagnosis of SARS-CoV-2 by FDA under an Emergency Use Authorization (EUA). This EUA will remain in effect (meaning this test can be used) for the duration of the COVID-19 declaration under Section 564(b)(1) of the Act, 21 U.S.C. section 360bbb-3(b)(1), unless the authorization is terminated or revoked.  Performed at [facility], [place] [address] [place], [place] [address]    WBC 12/22/2022 3.6 (L)  4.0 - 10.5 K/uL Final   RBC 12/22/2022 4.45  4.22 - 5.81 MIL/uL Final   Hemoglobin 12/22/2022 14.0  13.0 - 17.0 g/dL Final   HCT 12/22/2022 40.6  39.0 - 52.0 % Final   MCV 12/22/2022 91.2  80.0 - 100.0 fL Final   MCH 12/22/2022 31.5  26.0 - 34.0 pg Final   MCHC 12/22/2022 34.5  30.0 - 36.0 g/dL Final   RDW 12/22/2022  14.5  11.5 - 15.5 % Final   Platelets 12/22/2022 308  150 - 400 K/uL Final    nRBC 12/22/2022 0.0  0.0 - 0.2 % Final   Neutrophils Relative % 12/22/2022 45  % Final   Neutro Abs 12/22/2022 1.6 (L)  1.7 - 7.7 K/uL Final   Lymphocytes Relative 12/22/2022 41  % Final   Lymphs Abs 12/22/2022 1.5  0.7 - 4.0 K/uL Final   Monocytes Relative 12/22/2022 10  % Final   Monocytes Absolute 12/22/2022 0.4  0.1 - 1.0 K/uL Final   Eosinophils Relative 12/22/2022 2  % Final   Eosinophils Absolute 12/22/2022 0.1  0.0 - 0.5 K/uL Final   Basophils Relative 12/22/2022 1  % Final   Basophils Absolute 12/22/2022 0.0  0.0 - 0.1 K/uL Final   Immature Granulocytes 12/22/2022 1  % Final   Abs Immature Granulocytes 12/22/2022 0.02  0.00 - 0.07 K/uL Final   Performed at [facility], [place] [address], [place], [place] [address]   Sodium 12/22/2022 137  135 - 145 mmol/L Final   Potassium 12/22/2022 3.5  3.5 - 5.1 mmol/L Final   Chloride 12/22/2022 102  98 - 111 mmol/L Final   CO2 12/22/2022 23  22 - 32 mmol/L Final   Glucose, Bld 12/22/2022 92  70 - 99 mg/dL Final   Glucose reference range applies only to samples taken after fasting for at least 8 hours.   BUN 12/22/2022 9  6 - 20 mg/dL Final   Creatinine, Ser 12/22/2022 1.00  0.61 - 1.24 mg/dL Final   Calcium 12/22/2022 8.9  8.9 - 10.3 mg/dL Final   Total Protein 12/22/2022 8.3 (H)  6.5 - 8.1 g/dL Final   Albumin 12/22/2022 3.6  3.5 - 5.0 g/dL Final   AST 12/22/2022 30  15 - 41 U/L Final   ALT 12/22/2022 23  0 - 44 U/L Final   Alkaline Phosphatase 12/22/2022 55  38 - 126 U/L Final   Total Bilirubin 12/22/2022 1.0  0.3 - 1.2 mg/dL Final   GFR, Estimated 12/22/2022 >60  >60 mL/min Final   Comment: (NOTE) Calculated using the CKD-EPI Creatinine Equation (2021)    Anion gap 12/22/2022 12  5 - 15 Final   Performed at [place] [address], [place], [place] [address]   Alcohol, Ethyl (B) 12/22/2022 156 (H)  <10 mg/dL Final   Comment: (NOTE) Lowest detectable limit for serum alcohol is 10 mg/dL.  For medical purposes  only. Performed at [facility], [place] [address], [place],  [address]    SARSCOV2ONAVIRUS 2 AG 12/22/2022 NEGATIVE  NEGATIVE Final   Comment: (NOTE) SARS-CoV-2 antigen NOT DETECTED.   Negative results are presumptive.  Negative results do not preclude SARS-CoV-2 infection and should not be used as the sole basis for treatment or other patient management decisions, including infection  control decisions, particularly in the presence of clinical signs and  symptoms consistent with COVID-19, or in those who have been in contact with the virus.  Negative results must be combined with clinical observations, patient history, and epidemiological information. The expected result is Negative.  Fact Sheet for Patients: HandmadeRecipes.com.cy  Fact Sheet for Healthcare Providers: FuneralLife.at  This test is not yet approved or cleared by the [place] FDA and  has been authorized for detection and/or diagnosis of SARS-CoV-2 by FDA under an Emergency Use Authorization (EUA).  This EUA will remain in effect (meaning this test can be used) for  the duration of  the COV                          ID-19 declaration under Section 564(b)(1) of the Act, 21 U.S.C. section 360bbb-3(b)(1), unless the authorization is terminated or revoked sooner.    Admission on 12/17/2022, Discharged on 12/18/2022  Component Date Value Ref Range Status   Glucose-Capillary 12/17/2022 89  70 - 99 mg/dL Final   Glucose reference range applies only to samples taken after fasting for at least 8 hours.   Comment 1 12/17/2022 Notify RN   Final   Sodium 12/17/2022 133 (L)  135 - 145 mmol/L Final   Potassium 12/17/2022 3.2 (L)  3.5 - 5.1 mmol/L Final   Chloride 12/17/2022 105  98 - 111 mmol/L Final   CO2 12/17/2022 20 (L)  22 - 32 mmol/L Final   Glucose, Bld 12/17/2022 89  70 - 99 mg/dL Final   Glucose reference range applies only to samples taken after fasting  for at least 8 hours.   BUN 12/17/2022 13  6 - 20 mg/dL Final   Creatinine, Ser 12/17/2022 0.97  0.61 - 1.24 mg/dL Final   Calcium 12/17/2022 8.1 (L)  8.9 - 10.3 mg/dL Final   Total Protein 12/17/2022 9.0 (H)  6.5 - 8.1 g/dL Final   Albumin 12/17/2022 3.9  3.5 - 5.0 g/dL Final   AST 12/17/2022 37  15 - 41 U/L Final   ALT 12/17/2022 25  0 - 44 U/L Final   Alkaline Phosphatase 12/17/2022 54  38 - 126 U/L Final   Total Bilirubin 12/17/2022 0.5  0.3 - 1.2 mg/dL Final   GFR, Estimated 12/17/2022 >60  >60 mL/min Final   Comment: (NOTE) Calculated using the CKD-EPI Creatinine Equation (2021)    Anion gap 12/17/2022 8  5 - 15 Final   Performed at [facility], [place] [address] [place], [place] [id number]   Salicylate Lvl 123XX123 <7.0 (L)  7.0 - 30.0 mg/dL Final   Performed at [place] [address], [place], [place] [address]   Acetaminophen (Tylenol), Serum 12/17/2022 <10 (L)  10 - 30 ug/mL Final   Comment: (NOTE) Therapeutic concentrations vary significantly. A range of 10-30 ug/mL  may be an effective concentration for many patients. However, some  are best treated at concentrations outside of this range. Acetaminophen concentrations >150 ug/mL at 4 hours after ingestion  and >50 ug/mL at 12 hours after ingestion are often associated with  toxic reactions.  Performed at [facility], [place] [address], [place], [place] [address]    Alcohol, Ethyl (B) 12/17/2022 198 (H)  <10 mg/dL Final   Comment: (NOTE) Lowest detectable limit for serum alcohol is 10 mg/dL.  For medical purposes only. Performed at [facility], [place] [address], [place], [place] [address]    Opiates 12/17/2022 NONE DETECTED  NONE DETECTED Final   Cocaine 12/17/2022 NONE DETECTED  NONE DETECTED Final   Benzodiazepines 12/17/2022 NONE DETECTED  NONE DETECTED Final   Amphetamines 12/17/2022 NONE DETECTED  NONE DETECTED Final    Tetrahydrocannabinol 12/17/2022 NONE DETECTED  NONE DETECTED Final   Barbiturates 12/17/2022 NONE DETECTED  NONE DETECTED Final   Comment: (NOTE) DRUG SCREEN FOR MEDICAL PURPOSES ONLY.  IF CONFIRMATION IS NEEDED FOR ANY PURPOSE, NOTIFY LAB WITHIN 5 DAYS.  LOWEST DETECTABLE LIMITS FOR URINE DRUG SCREEN Drug Class  Cutoff (ng/mL) Amphetamine and metabolites    1000 Barbiturate and metabolites    200 Benzodiazepine                 200 Opiates and metabolites        300 Cocaine and metabolites        300 THC                            50 Performed at [facility], [place] [address] [place], [place] [address]    WBC 12/17/2022 4.8  4.0 - 10.5 K/uL Final   RBC 12/17/2022 4.29  4.22 - 5.81 MIL/uL Final   Hemoglobin 12/17/2022 13.4  13.0 - 17.0 g/dL Final   HCT 12/17/2022 40.0  39.0 - 52.0 % Final   MCV 12/17/2022 93.2  80.0 - 100.0 fL Final   MCH 12/17/2022 31.2  26.0 - 34.0 pg Final   MCHC 12/17/2022 33.5  30.0 - 36.0 g/dL Final   RDW 12/17/2022 14.6  11.5 - 15.5 % Final   Platelets 12/17/2022 296  150 - 400 K/uL Final   nRBC 12/17/2022 0.0  0.0 - 0.2 % Final   Neutrophils Relative % 12/17/2022 55  % Final   Neutro Abs 12/17/2022 2.6  1.7 - 7.7 K/uL Final   Lymphocytes Relative 12/17/2022 31  % Final   Lymphs Abs 12/17/2022 1.5  0.7 - 4.0 K/uL Final   Monocytes Relative 12/17/2022 11  % Final   Monocytes Absolute 12/17/2022 0.5  0.1 - 1.0 K/uL Final   Eosinophils Relative 12/17/2022 3  % Final   Eosinophils Absolute 12/17/2022 0.1  0.0 - 0.5 K/uL Final   Basophils Relative 12/17/2022 0  % Final   Basophils Absolute 12/17/2022 0.0  0.0 - 0.1 K/uL Final   Immature Granulocytes 12/17/2022 0  % Final   Abs Immature Granulocytes 12/17/2022 0.02  0.00 - 0.07 K/uL Final   Performed at [facility], [place] [address] [place],  [address]  Admission on 12/13/2022, Discharged on 12/14/2022  Component Date Value Ref Range Status    Glucose-Capillary 12/13/2022 85  70 - 99 mg/dL Final   Glucose reference range applies only to samples taken after fasting for at least 8 hours.   Alcohol, Ethyl (B) 12/13/2022 133 (H)  <10 mg/dL Final   Comment: (NOTE) Lowest detectable limit for serum alcohol is 10 mg/dL.  For medical purposes only. Performed at [facility], [place] [address] [place], [place] [address]    WBC 12/13/2022 4.0  4.0 - 10.5 K/uL Final   RBC 12/13/2022 4.20 (L)  4.22 - 5.81 MIL/uL Final   Hemoglobin 12/13/2022 13.2  13.0 - 17.0 g/dL Final   HCT 12/13/2022 38.6 (L)  39.0 - 52.0 % Final   MCV 12/13/2022 91.9  80.0 - 100.0 fL Final   MCH 12/13/2022 31.4  26.0 - 34.0 pg Final   MCHC 12/13/2022 34.2  30.0 - 36.0 g/dL Final   RDW 12/13/2022 14.4  11.5 - 15.5 % Final   Platelets 12/13/2022 272  150 - 400 K/uL Final   nRBC 12/13/2022 0.0  0.0 - 0.2 % Final   Performed at [facility], [place] [address] [place], [place] [address]   Sodium 12/13/2022 137  135 - 145 mmol/L Final   Potassium 12/13/2022 3.5  3.5 - 5.1 mmol/L Final   Chloride 12/13/2022 105  98 - 111 mmol/L Final  CO2 12/13/2022 21 (L)  22 - 32 mmol/L Final   Glucose, Bld 12/13/2022 81  70 - 99 mg/dL Final   Glucose reference range applies only to samples taken after fasting for at least 8 hours.   BUN 12/13/2022 10  6 - 20 mg/dL Final   Creatinine, Ser 12/13/2022 0.95  0.61 - 1.24 mg/dL Final   Calcium 12/13/2022 8.8 (L)  8.9 - 10.3 mg/dL Final   Total Protein 12/13/2022 8.5 (H)  6.5 - 8.1 g/dL Final   Albumin 12/13/2022 3.5  3.5 - 5.0 g/dL Final   AST 12/13/2022 34  15 - 41 U/L Final   ALT 12/13/2022 29  0 - 44 U/L Final   Alkaline Phosphatase 12/13/2022 56  38 - 126 U/L Final   Total Bilirubin 12/13/2022 0.8  0.3 - 1.2 mg/dL Final   GFR, Estimated 12/13/2022 >60  >60 mL/min Final   Comment: (NOTE) Calculated using the CKD-EPI Creatinine Equation (2021)    Anion gap 12/13/2022 11  5 - 15 Final   Performed at [place], [place] [address] [place], [place] [address]   Acetaminophen (Tylenol), Serum 12/14/2022 <10 (L)  10 - 30 ug/mL Final   Comment: (NOTE) Therapeutic concentrations vary significantly. A range of 10-30 ug/mL  may be an effective concentration for many patients. However, some  are best treated at concentrations outside of this range. Acetaminophen concentrations >150 ug/mL at 4 hours after ingestion  and >50 ug/mL at 12 hours after ingestion are often associated with  toxic reactions.  Performed at [facility], [place] [address], [place], [place] [id number]    Salicylate Lvl [id number] <7.0 (L)  7.0 - 30.0 mg/dL Final   Performed at [place] [address] [place], [place] [address]  Admission on 12/13/2022, Discharged on 12/13/2022  Component Date Value Ref Range Status   SARS Coronavirus 2 by RT PCR 12/13/2022 NEGATIVE  NEGATIVE Final   Influenza A by PCR 12/13/2022 NEGATIVE  NEGATIVE Final   Influenza B by PCR 12/13/2022 NEGATIVE  NEGATIVE Final   Comment: (NOTE) The Xpert Xpress SARS-CoV-2/FLU/RSV plus assay is intended as an aid in the diagnosis of influenza from Nasopharyngeal swab specimens and should not be used as a sole basis for treatment. Nasal washings and aspirates are unacceptable for Xpert Xpress SARS-CoV-2/FLU/RSV testing.  Fact Sheet for Patients: EntrepreneurPulse.com.au  Fact Sheet for Healthcare Providers: IncredibleEmployment.be  This test is not yet approved or cleared by the [place] FDA and has been authorized for detection and/or diagnosis of SARS-CoV-2 by FDA under an Emergency Use Authorization (EUA). This EUA will remain in effect (meaning this test can be used) for the duration of the COVID-19 declaration under Section 564(b)(1) of the Act, 21 U.S.C. section 360bbb-3(b)(1), unless the authorization is terminated or revoked.     Resp Syncytial Virus by PCR 12/13/2022 NEGATIVE  NEGATIVE Final    Comment: (NOTE) Fact Sheet for Patients: EntrepreneurPulse.com.au  Fact Sheet for Healthcare Providers: IncredibleEmployment.be  This test is not yet approved or cleared by the [place] FDA and has been authorized for detection and/or diagnosis of SARS-CoV-2 by FDA under an Emergency Use Authorization (EUA). This EUA will remain in effect (meaning this test can be used) for the duration of the COVID-19 declaration under Section 564(b)(1) of the Act, 21 U.S.C. section 360bbb-3(b)(1), unless the authorization is terminated or revoked.  Performed at [facility], [place] [address], [place], [place] [address]  WBC 12/13/2022 3.7 (L)  4.0 - 10.5 K/uL Final   RBC 12/13/2022 4.18 (L)  4.22 - 5.81 MIL/uL Final   Hemoglobin 12/13/2022 12.9 (L)  13.0 - 17.0 g/dL Final   HCT 12/13/2022 39.1  39.0 - 52.0 % Final   MCV 12/13/2022 93.5  80.0 - 100.0 fL Final   MCH 12/13/2022 30.9  26.0 - 34.0 pg Final   MCHC 12/13/2022 33.0  30.0 - 36.0 g/dL Final   RDW 12/13/2022 14.5  11.5 - 15.5 % Final   Platelets 12/13/2022 268  150 - 400 K/uL Final   nRBC 12/13/2022 0.0  0.0 - 0.2 % Final   Neutrophils Relative % 12/13/2022 55  % Final   Neutro Abs 12/13/2022 2.0  1.7 - 7.7 K/uL Final   Lymphocytes Relative 12/13/2022 34  % Final   Lymphs Abs 12/13/2022 1.3  0.7 - 4.0 K/uL Final   Monocytes Relative 12/13/2022 8  % Final   Monocytes Absolute 12/13/2022 0.3  0.1 - 1.0 K/uL Final   Eosinophils Relative 12/13/2022 3  % Final   Eosinophils Absolute 12/13/2022 0.1  0.0 - 0.5 K/uL Final   Basophils Relative 12/13/2022 0  % Final   Basophils Absolute 12/13/2022 0.0  0.0 - 0.1 K/uL Final   Immature Granulocytes 12/13/2022 0  % Final   Abs Immature Granulocytes 12/13/2022 0.01  0.00 - 0.07 K/uL Final   Performed at [facility], [place] [address], [place], [place] [address]   Sodium 12/13/2022 137  135 - 145 mmol/L Final   Potassium 12/13/2022 3.4 (L)  3.5 -  5.1 mmol/L Final   Chloride 12/13/2022 104  98 - 111 mmol/L Final   CO2 12/13/2022 20 (L)  22 - 32 mmol/L Final   Glucose, Bld 12/13/2022 85  70 - 99 mg/dL Final   Glucose reference range applies only to samples taken after fasting for at least 8 hours.   BUN 12/13/2022 10  6 - 20 mg/dL Final   Creatinine, Ser 12/13/2022 0.91  0.61 - 1.24 mg/dL Final   Calcium 12/13/2022 8.6 (L)  8.9 - 10.3 mg/dL Final   Total Protein 12/13/2022 8.1  6.5 - 8.1 g/dL Final   Albumin 12/13/2022 3.4 (L)  3.5 - 5.0 g/dL Final   AST 12/13/2022 36  15 - 41 U/L Final   ALT 12/13/2022 29  0 - 44 U/L Final   Alkaline Phosphatase 12/13/2022 56  38 - 126 U/L Final   Total Bilirubin 12/13/2022 0.7  0.3 - 1.2 mg/dL Final   GFR, Estimated 12/13/2022 >60  >60 mL/min Final   Comment: (NOTE) Calculated using the CKD-EPI Creatinine Equation (2021)    Anion gap 12/13/2022 13  5 - 15 Final   Performed at [facility], [place] [address] [place], [place] [address]   Alcohol, Ethyl (B) 12/13/2022 136 (H)  <10 mg/dL Final   Comment: (NOTE) Lowest detectable limit for serum alcohol is 10 mg/dL.  For medical purposes only. Performed at [facility], [place] [address] [place], [place] [address]    POC Amphetamine UR 12/13/2022 None Detected  NONE DETECTED (Cut Off Level 1000 ng/mL) Final   POC Secobarbital (BAR) 12/13/2022 None Detected  NONE DETECTED (Cut Off Level 300 ng/mL) Final   POC Buprenorphine (BUP) 12/13/2022 None Detected  NONE DETECTED (Cut Off Level 10 ng/mL) Final   POC Oxazepam (BZO) 12/13/2022 None Detected  NONE DETECTED (Cut Off Level 300 ng/mL) Final   POC Cocaine UR  12/13/2022 None Detected  NONE DETECTED (Cut Off Level 300 ng/mL) Final   POC Methamphetamine UR 12/13/2022 None Detected  NONE DETECTED (Cut Off Level 1000 ng/mL) Final   POC Morphine 12/13/2022 None Detected  NONE DETECTED (Cut Off Level 300 ng/mL) Final   POC Methadone UR 12/13/2022 None Detected  NONE DETECTED (Cut Off Level 300  ng/mL) Final   POC Oxycodone UR 12/13/2022 None Detected  NONE DETECTED (Cut Off Level 100 ng/mL) Final   POC Marijuana UR 12/13/2022 None Detected  NONE DETECTED (Cut Off Level 50 ng/mL) Final   SARSCOV2ONAVIRUS 2 AG 12/13/2022 NEGATIVE  NEGATIVE Final   Comment: (NOTE) SARS-CoV-2 antigen NOT DETECTED.   Negative results are presumptive.  Negative results do not preclude SARS-CoV-2 infection and should not be used as the sole basis for treatment or other patient management decisions, including infection  control decisions, particularly in the presence of clinical signs and  symptoms consistent with COVID-19, or in those who have been in contact with the virus.  Negative results must be combined with clinical observations, patient history, and epidemiological information. The expected result is Negative.  Fact Sheet for Patients: HandmadeRecipes.com.cy  Fact Sheet for Healthcare Providers: FuneralLife.at  This test is not yet approved or cleared by the [place] FDA and  has been authorized for detection and/or diagnosis of SARS-CoV-2 by FDA under an Emergency Use Authorization (EUA).  This EUA will remain in effect (meaning this test can be used) for the duration of  the COV                          ID-19 declaration under Section 564(b)(1) of the Act, 21 U.S.C. section 360bbb-3(b)(1), unless the authorization is terminated or revoked sooner.    Admission on 12/11/2022, Discharged on 12/11/2022  Component Date Value Ref Range Status   Troponin I (High Sensitivity) 12/11/2022 8  <18 ng/L Final   Comment: (NOTE) Elevated high sensitivity troponin I (hsTnI) values and significant  changes across serial measurements may suggest ACS but many other  chronic and acute conditions are known to elevate hsTnI results.  Refer to the "Links" section for chest pain algorithms and additional  guidance. Performed at [facility],  [place] [address] [place], [place] [address]    WBC 12/11/2022 3.5 (L)  4.0 - 10.5 K/uL Final   RBC 12/11/2022 4.00 (L)  4.22 - 5.81 MIL/uL Final   Hemoglobin 12/11/2022 12.3 (L)  13.0 - 17.0 g/dL Final   HCT 12/11/2022 37.7 (L)  39.0 - 52.0 % Final   MCV 12/11/2022 94.3  80.0 - 100.0 fL Final   MCH 12/11/2022 30.8  26.0 - 34.0 pg Final   MCHC 12/11/2022 32.6  30.0 - 36.0 g/dL Final   RDW 12/11/2022 14.5  11.5 - 15.5 % Final   Platelets 12/11/2022 261  150 - 400 K/uL Final   nRBC 12/11/2022 0.0  0.0 - 0.2 % Final   Neutrophils Relative % 12/11/2022 47  % Final   Neutro Abs 12/11/2022 1.7  1.7 - 7.7 K/uL Final   Lymphocytes Relative 12/11/2022 39  % Final   Lymphs Abs 12/11/2022 1.4  0.7 - 4.0 K/uL Final   Monocytes Relative 12/11/2022 10  % Final   Monocytes Absolute 12/11/2022 0.4  0.1 - 1.0 K/uL Final   Eosinophils Relative 12/11/2022 3  % Final   Eosinophils Absolute 12/11/2022 0.1  0.0 - 0.5 K/uL Final   Basophils Relative 12/11/2022  1  % Final   Basophils Absolute 12/11/2022 0.0  0.0 - 0.1 K/uL Final   Immature Granulocytes 12/11/2022 0  % Final   Abs Immature Granulocytes 12/11/2022 0.01  0.00 - 0.07 K/uL Final   Performed at [facility], [place] [address] [place], [place] [address]   Sodium 12/11/2022 137  135 - 145 mmol/L Final   Potassium 12/11/2022 3.2 (L)  3.5 - 5.1 mmol/L Final   Chloride 12/11/2022 104  98 - 111 mmol/L Final   CO2 12/11/2022 20 (L)  22 - 32 mmol/L Final   Glucose, Bld 12/11/2022 85  70 - 99 mg/dL Final   Glucose reference range applies only to samples taken after fasting for at least 8 hours.   BUN 12/11/2022 11  6 - 20 mg/dL Final   Creatinine, Ser 12/11/2022 1.07  0.61 - 1.24 mg/dL Final   Calcium 12/11/2022 8.4 (L)  8.9 - 10.3 mg/dL Final   GFR, Estimated 12/11/2022 >60  >60 mL/min Final   Comment: (NOTE) Calculated using the CKD-EPI Creatinine Equation (2021)    Anion gap 12/11/2022 13  5 - 15 Final   Performed at [facility], [place] [address] [place], [place] [address]  There may be more visits with results that are not included.    Allergies: Bee venom, Zestril [lisinopril], and Bactrim [sulfamethoxazole-trimethoprim]  Medications:   Medical Decision Making  Recommend admission to continuous observation unit overnight and reeval for SI/HI/AVH in the a.m.  Lab Orders         Resp panel by RT-PCR (RSV, Flu A&B, Covid) Anterior Nasal Swab         Ethanol         CBC with Differential/Platelet         Comprehensive metabolic panel         POCT Urine Drug Screen - (I-Screen)         POC SARS Coronavirus 2 Ag      As needed meds -Tylenol 650 mg p.o. every 6 hours as needed pain -Maalox 30 mL p.o. every 4 hours as needed indigestion -Atarax 25 mg p.o. 3 times daily as needed anxiety -MOM 30 mL p.o. daily as needed constipation -Trazodone 50 mg p.o. nightly as needed insomnia   Recommendations  Based on my evaluation the patient does not appear to have an emergency medical condition.  Recommend admission to the continuous observation unit overnight for safety monitoring and re-eval for SI/HI/AVH in the a.m.  Randon Goldsmith, NP 01/08/23  1:11 AM

## 2023-01-08 NOTE — ED Provider Notes (Signed)
FBC/OBS ASAP Discharge Summary  Date and Time: 01/08/2023 7:59 AM  Name: William Powers  MRN:  [id number]   Discharge Diagnoses:  Final diagnoses:  Severe episode of recurrent major depressive disorder, without psychotic features ([place])  Suicidal ideation  Sheltered homelessness   Subjective:   Stay Summary: William Powers is a 42 year old male with history of malingering behaviors, depression, Alcohol Use Disorder, and polysubstance use presented to [facility] voluntarily as a walk-in to [facility] with a complaint of of suicidal ideations, depression, with a plan to jump from a bridge. Patient is well-known to the [employer] as he has had multiple presentations to ED reporting suicidal ideations, depression, and admits to overuse of alcohol and substance use (cocaine and THC). Patient reports that this episode of depression and suicidal ideation is related the death of his grandmother two days ago. He endorses feeling great sadness about the loss of his grandmother which caused him to drink earlier yesterday and he became depressed with thoughts of committing suicide by jumping from a bridge. Of note patient has reported similar plans for committing suicide during his prior visits at the ED. Patient is homeless and frequently utilizes the ED for shelter resources. On evaluation today, patient denies SI and reports ongoing grief related to the death of his grandmother. ETOH level 90 and UDS positive for cocaine. He denies any withdrawal symptoms. Patient is able to contract for safety and would like to be discharged.  Total Time spent with patient: 20 minutes Tobacco Cessation:  N/A, patient does not currently use tobacco products  Current Medications:  Current Facility-Administered Medications  Medication Dose Route Frequency Provider Last Rate Last Admin   acetaminophen (TYLENOL) tablet 650 mg  650 mg Oral Q6H PRN Onuoha, Chinwendu V, NP       alum & mag hydroxide-simeth  (MAALOX/MYLANTA) 200-200-20 MG/5ML suspension 30 mL  30 mL Oral Q4H PRN Onuoha, Chinwendu V, NP       hydrOXYzine (ATARAX) tablet 25 mg  25 mg Oral TID PRN Onuoha, Chinwendu V, NP   25 mg at 01/08/23 0044   magnesium hydroxide (MILK OF MAGNESIA) suspension 30 mL  30 mL Oral Daily PRN Onuoha, Chinwendu V, NP       traZODone (DESYREL) tablet 50 mg  50 mg Oral QHS PRN Onuoha, Chinwendu V, NP   50 mg at 01/08/23 0044   No current outpatient medications on file.    PTA Medications:  Facility Ordered Medications  Medication   acetaminophen (TYLENOL) tablet 650 mg   alum & mag hydroxide-simeth (MAALOX/MYLANTA) 200-200-20 MG/5ML suspension 30 mL   magnesium hydroxide (MILK OF MAGNESIA) suspension 30 mL   hydrOXYzine (ATARAX) tablet 25 mg   traZODone (DESYREL) tablet 50 mg       05/03/2022   12:30 PM 08/11/2021    8:12 AM  Depression screen PHQ 2/9  Decreased Interest 1 0  Down, Depressed, Hopeless 2 0  PHQ - 2 Score 3 0  Altered sleeping 2   Tired, decreased energy 1   Change in appetite 0   Feeling bad or failure about yourself  2   Trouble concentrating 0   Moving slowly or fidgety/restless 0   Suicidal thoughts 2   PHQ-9 Score 10   Difficult doing work/chores Somewhat difficult     Flowsheet Row ED from 01/07/2023 in [facility] ED from 01/05/2023 in [facility] Emergency Department at [facility] ED from 01/02/2023 in [facility] Department at [place] CATEGORY High Risk High Risk High Risk       Musculoskeletal  Strength & Muscle Tone: within normal limits Gait & Station: normal Patient leans: N/A  Psychiatric Specialty Exam  Presentation  General Appearance:  Appropriate for Environment  Eye Contact: Fair  Speech: Clear and Coherent  Speech Volume: Normal  Handedness: Right   Mood and Affect  Mood: Euthymic  Affect: Congruent   Thought Process  Thought Processes: Goal  Directed  Descriptions of Associations:Circumstantial  Orientation:Full (Time, Place and Person)  Thought Content:WDL  Diagnosis of Schizophrenia or Schizoaffective disorder in past: No    Hallucinations:Hallucinations: None  Ideas of Reference:None  Suicidal Thoughts:Suicidal Thoughts: Yes, Active SI Active Intent and/or Plan: With Plan  Homicidal Thoughts:Homicidal Thoughts: No   Sensorium  Memory: Immediate Fair  Judgment: Fair  Insight: Present   Executive Functions  Concentration: Good  Attention Span: Good  Recall: Good  Fund of Knowledge: Good  Language: Good   Psychomotor Activity  Psychomotor Activity: Psychomotor Activity: Normal   Assets  Assets: Communication Skills; Desire for Improvement; Resilience   Sleep  Sleep: Sleep: Poor   Nutritional Assessment (For OBS and FBC admissions only) Has the patient had a weight loss or gain of 10 pounds or more in the last 3 months?: No Has the patient had a decrease in food intake/or appetite?: No Does the patient have dental problems?: No Does the patient have eating habits or behaviors that may be indicators of an eating disorder including binging or inducing vomiting?: No Has the patient recently lost weight without trying?: 0 Has the patient been eating poorly because of a decreased appetite?: 0 Malnutrition Screening Tool Score: 0    Physical Exam  Physical Exam Constitutional:      Appearance: Normal appearance.  HENT:     Head: Normocephalic.  Eyes:     Extraocular Movements: Extraocular movements intact.     Pupils: Pupils are equal, round, and reactive to light.  Cardiovascular:     Rate and Rhythm: Normal rate and regular rhythm.  Pulmonary:     Effort: Pulmonary effort is normal.     Breath sounds: Normal breath sounds.  Musculoskeletal:        General: Normal range of motion.     Cervical back: Normal range of motion.  Neurological:     General: No focal deficit  present.     Mental Status: He is alert.   Review of Systems  Psychiatric/Behavioral:  Positive for substance abuse.    Blood pressure 131/89, pulse 92, temperature 98.6 F (37 C), temperature source Oral, resp. rate 20, SpO2 100 %. There is no height or weight on file to calculate BMI.  Demographic Factors:  Male and Low socioeconomic status  Loss Factors: Financial problems/change in socioeconomic status  Historical Factors: Impulsivity  Risk Reduction Factors:   NA  Continued Clinical Symptoms:  Alcohol/Substance Abuse/Dependencies  Cognitive Features That Contribute To Risk:  None    Suicide Risk:  Minimal: No identifiable suicidal ideation.  Patients presenting with no risk factors but with morbid ruminations; may be classified as minimal risk based on the severity of the depressive symptoms  Patient has not benefited from past hospitalizations under similar circumstances in terms of suicide risk modification or improvement in mental health or social conditions. Patient's repeated use of emergency department and inpatient services instead of recommended outpatient follow-up is ineffective in helping improvement. Inpatient hospitalization is not indicated,  and the patient is refusing interventions that the clinical care team has offered in the emergency department to mitigate risk of self-harm, other than allowing [place] to observe the patient to sobriety.     Plan Of Care/Follow-up recommendations:  Other:  Outpatient resources provided. Patient advised to return if thoughts of suicide worsen or become more intrusive with a plan. Patient able to contact for safety.    Disposition: Home to self  Molli Barrows, NP 01/08/2023, 7:59 AM

## 2023-01-08 NOTE — Discharge Instructions (Addendum)
Therapy Walk-in Hours  Monday-Wednesday: 8 AM until slots are full  Friday: 1 PM to 5 PM  For Monday to Wednesday, it is recommended that patients arrive between 7:30 AM and 7:45 AM because patients will be seen in the order of arrival.  For Friday, we ask that patients arrive between 12 PM to 12:30 PM.  Go to the second floor on arrival and check in.  **Availability is limited; therefore, patients may not be seen on the same day.**  Medication management walk-ins:  Monday to Friday: 8 AM to 11 AM.  It is recommended that patients arrive by 7:30 AM to 7:45 AM because patients will be seen in the order of arrival.  Go to the second floor on arrival and check in.  **Availability is limited; therefore, patients may not be seen on the same day.**- 

## 2023-01-08 NOTE — ED Notes (Signed)
Pt continues to rest quietly.  Breathing even and unlabored.  Pt is in view of nursing station.

## 2023-01-08 NOTE — ED Notes (Signed)
Pt has been given breakfast along with  AVS and  bus pass. All his belongings were returned to him and he was escorted to lobby.  Pt left without incident.

## 2023-01-13 ENCOUNTER — Emergency Department (HOSPITAL_COMMUNITY): Payer: Medicaid Other

## 2023-01-13 ENCOUNTER — Emergency Department (HOSPITAL_COMMUNITY)
Admission: EM | Admit: 2023-01-13 | Discharge: 2023-01-13 | Disposition: A | Discharge status: Home / Self Care | Payer: Medicaid Other | Attending: Emergency Medicine | Admitting: Emergency Medicine

## 2023-01-13 ENCOUNTER — Other Ambulatory Visit: Payer: Self-pay

## 2023-01-13 ENCOUNTER — Encounter (HOSPITAL_COMMUNITY): Payer: Self-pay

## 2023-01-13 DIAGNOSIS — X80XXXA Intentional self-harm by jumping from a high place, initial encounter: Secondary | ICD-10-CM | POA: Diagnosis not present

## 2023-01-13 DIAGNOSIS — M79651 Pain in right thigh: Secondary | ICD-10-CM | POA: Insufficient documentation

## 2023-01-13 DIAGNOSIS — R4589 Other symptoms and signs involving emotional state: Secondary | ICD-10-CM

## 2023-01-13 DIAGNOSIS — Y906 Blood alcohol level of 120-199 mg/100 ml: Secondary | ICD-10-CM | POA: Diagnosis not present

## 2023-01-13 DIAGNOSIS — R4182 Altered mental status, unspecified: Secondary | ICD-10-CM | POA: Diagnosis not present

## 2023-01-13 DIAGNOSIS — F1010 Alcohol abuse, uncomplicated: Secondary | ICD-10-CM | POA: Insufficient documentation

## 2023-01-13 LAB — COMPREHENSIVE METABOLIC PANEL
ALT: 28 U/L (ref 0–44)
AST: 37 U/L (ref 15–41)
Albumin: 3.3 g/dL — ABNORMAL LOW (ref 3.5–5.0)
Alkaline Phosphatase: 46 U/L (ref 38–126)
Anion gap: 11 (ref 5–15)
BUN: 12 mg/dL (ref 6–20)
CO2: 21 mmol/L — ABNORMAL LOW (ref 22–32)
Calcium: 8.4 mg/dL — ABNORMAL LOW (ref 8.9–10.3)
Chloride: 102 mmol/L (ref 98–111)
Creatinine, Ser: 0.96 mg/dL (ref 0.61–1.24)
GFR, Estimated: >60 mL/min (ref >60)
Glucose, Bld: 87 mg/dL (ref 70–99)
Potassium: 2.9 mmol/L — ABNORMAL LOW (ref 3.5–5.1)
Sodium: 134 mmol/L — ABNORMAL LOW (ref 135–145)
Total Bilirubin: 0.6 mg/dL (ref 0.3–1.2)
Total Protein: 7.8 g/dL (ref 6.5–8.1)

## 2023-01-13 LAB — CBC WITH DIFFERENTIAL/PLATELET
Abs Immature Granulocytes: 0.01 10*3/uL (ref 0.00–0.07)
Basophils Absolute: 0 10*3/uL (ref 0.0–0.1)
Basophils Relative: 0 %
Eosinophils Absolute: 0.1 10*3/uL (ref 0.0–0.5)
Eosinophils Relative: 4 %
HCT: 39.2 % (ref 39.0–52.0)
Hemoglobin: 13.3 g/dL (ref 13.0–17.0)
Immature Granulocytes: 0 %
Lymphocytes Relative: 38 %
Lymphs Abs: 1.2 10*3/uL (ref 0.7–4.0)
MCH: 31.4 pg (ref 26.0–34.0)
MCHC: 33.9 g/dL (ref 30.0–36.0)
MCV: 92.7 fL (ref 80.0–100.0)
Monocytes Absolute: 0.3 10*3/uL (ref 0.1–1.0)
Monocytes Relative: 9 %
Neutro Abs: 1.5 10*3/uL — ABNORMAL LOW (ref 1.7–7.7)
Neutrophils Relative %: 49 %
Platelets: 260 10*3/uL (ref 150–400)
RBC: 4.23 MIL/uL (ref 4.22–5.81)
RDW: 14.2 % (ref 11.5–15.5)
WBC: 3.1 10*3/uL — ABNORMAL LOW (ref 4.0–10.5)
nRBC: 0 % (ref 0.0–0.2)

## 2023-01-13 LAB — RAPID URINE DRUG SCREEN, HOSP PERFORMED
Amphetamines: NOT DETECTED
Barbiturates: NOT DETECTED
Benzodiazepines: NOT DETECTED
Cocaine: NOT DETECTED
Opiates: NOT DETECTED
Tetrahydrocannabinol: NOT DETECTED

## 2023-01-13 LAB — ETHANOL: Alcohol, Ethyl (B): 124 mg/dL — ABNORMAL HIGH (ref <10)

## 2023-01-13 LAB — SALICYLATE LEVEL: Salicylate Lvl: <7 mg/dL — ABNORMAL LOW (ref 7.0–30.0)

## 2023-01-13 LAB — ACETAMINOPHEN LEVEL: Acetaminophen (Tylenol), Serum: <10 ug/mL — ABNORMAL LOW (ref 10–30)

## 2023-01-13 MED ORDER — THIAMINE HCL 100 MG/ML IJ SOLN: 100.0000 mg | Freq: Every day | INTRAMUSCULAR | Status: DC

## 2023-01-13 MED ORDER — LORAZEPAM 2 MG/ML IJ SOLN: 0.0000 mg | Freq: Four times a day (QID) | INTRAMUSCULAR | Status: DC

## 2023-01-13 MED ORDER — LORAZEPAM 1 MG PO TABS: 0.0000 mg | ORAL_TABLET | Freq: Two times a day (BID) | ORAL | Status: DC

## 2023-01-13 MED ORDER — LORAZEPAM 1 MG PO TABS: 0.0000 mg | ORAL_TABLET | Freq: Four times a day (QID) | ORAL | Status: DC

## 2023-01-13 MED ORDER — LORAZEPAM 2 MG/ML IJ SOLN: 0.0000 mg | Freq: Two times a day (BID) | INTRAMUSCULAR | Status: DC

## 2023-01-13 MED ORDER — POTASSIUM CHLORIDE CRYS ER 20 MEQ PO TBCR: 40.0000 meq | EXTENDED_RELEASE_TABLET | Freq: Once | ORAL | Status: DC

## 2023-01-13 MED ORDER — IOHEXOL 350 MG/ML SOLN
75.0000 mL | Freq: Once | INTRAVENOUS | Status: AC | PRN
  Administered 2023-01-13: 75 mL via INTRAVENOUS

## 2023-01-13 MED ORDER — THIAMINE MONONITRATE 100 MG PO TABS: 100.0000 mg | ORAL_TABLET | Freq: Every day | ORAL | Status: DC

## 2023-01-13 NOTE — ED Provider Notes (Addendum)
Patient medically cleared.  IVC in place.  Hard to know if his plan was to jump off of a bridge or if he actually did.  There was no signs of any trauma on exam.  Trauma images are unremarkable and medical workup is unremarkable.  Alcohol level elevated.  Dr. Ralene Bathe.Handoff she from suspects that patient can be discharged after he sobers up as it is unlikely that he actually jumped off a bridge.  Patient is now awake and alert.  He states that he lied about last night.  He admits that he was drunk.  He is not suicidal.  He just needed a place to stay overnight.  Ultimately patient appears well.  Discharged in good condition.  I do not think there is any mental health emergency at this time.  This chart was dictated using voice recognition software.  Despite best efforts to proofread,  errors can occur which can change the documentation meaning.    Lennice Sites, DO 01/13/23 [place], [place], DO 01/13/23 [place], [place], DO 01/13/23 [phone]

## 2023-01-13 NOTE — ED Provider Notes (Signed)
[place] Provider Note   CSN: [id number] Arrival date & time: 01/13/23  0416     History  Chief Complaint  Patient presents with   Suicide Attempt    William Powers is a 42 y.o. male.  The history is provided by the patient, the EMS personnel and medical records.  William Powers is a 42 y.o. male who presents to the Emergency Department complaining of suicide attempt.  He presents to the emergency department by EMS following self-reported suicide attempt.  This was not witnessed but patient told EMS that he attempted to kill himself by jumping off the bridge at [place].  This is reported to be about 12 feet high.  He states that he then got a ride from a man in a [place] and was dropped off 3 miles down the road.  He complains of pain to his right thigh.  No additional injuries.  He does report drinking alcohol today.  He reports that he drinks alcohol daily.  He has ongoing suicidal thoughts.  He denies any drug use.     Home Medications Prior to Admission medications   Medication Sig Start Date End Date Taking? Authorizing Provider  Potassium Chloride ER 20 MEQ TBCR Take 20 mEq by mouth daily at 6 (six) AM. Patient not taking: No sig reported 08/07/21 09/05/21  Reubin Milan, MD      Allergies    Bee venom, Zestril [lisinopril], and Bactrim [sulfamethoxazole-trimethoprim]    Review of Systems   Review of Systems  All other systems reviewed and are negative.   Physical Exam Updated Vital Signs BP (!) 147/110   Pulse 92   Temp 97.9 F (36.6 C) (Oral)   Resp 18   Ht 6\' 4"  (1.93 m)   Wt 108.9 kg   SpO2 99%   BMI 29.21 kg/m  Physical Exam Vitals and nursing note reviewed.  Constitutional:      Appearance: He is well-developed.  HENT:     Head: Normocephalic and atraumatic.  Cardiovascular:     Rate and Rhythm: Normal rate and regular rhythm.     Heart sounds: No murmur heard. Pulmonary:     Effort:  Pulmonary effort is normal. No respiratory distress.     Breath sounds: Normal breath sounds.  Abdominal:     Palpations: Abdomen is soft.     Tenderness: There is no abdominal tenderness. There is no guarding or rebound.  Musculoskeletal:     Comments: 2+ DP pulses bilaterally.  There is tenderness to palpation over the right mid thigh without any palpable swelling or deformity.  Skin:    General: Skin is warm and dry.  Neurological:     Mental Status: He is alert and oriented to person, place, and time.  Psychiatric:     Comments: Mildly agitated.  Reports SI.  Does not appear to be responding to internal stimuli.     ED Results / Procedures / Treatments   Labs (all labs ordered are listed, but only abnormal results are displayed) Labs Reviewed  COMPREHENSIVE METABOLIC PANEL - Abnormal; Notable for the following components:      Result Value   Sodium 134 (*)    Potassium 2.9 (*)    CO2 21 (*)    Calcium 8.4 (*)    Albumin 3.3 (*)    All other components within normal limits  ETHANOL - Abnormal; Notable for the following components:   Alcohol, Ethyl (  B) 124 (*)    All other components within normal limits  CBC WITH DIFFERENTIAL/PLATELET - Abnormal; Notable for the following components:   WBC 3.1 (*)    Neutro Abs 1.5 (*)    All other components within normal limits  ACETAMINOPHEN LEVEL - Abnormal; Notable for the following components:   Acetaminophen (Tylenol), Serum <10 (*)    All other components within normal limits  SALICYLATE LEVEL - Abnormal; Notable for the following components:   Salicylate Lvl [id number] (*)    All other components within normal limits  RAPID URINE DRUG SCREEN, HOSP PERFORMED    EKG None  Radiology CT Head Wo Contrast  Result Date: 01/13/2023 CLINICAL DATA:  Head trauma with abnormal mental status. Intoxication. Suicide attempt. EXAM: CT HEAD WITHOUT CONTRAST CT CERVICAL SPINE WITHOUT CONTRAST TECHNIQUE: Multidetector CT imaging of the head and  cervical spine was performed following the standard protocol without intravenous contrast. Multiplanar CT image reconstructions of the cervical spine were also generated. RADIATION DOSE REDUCTION: This exam was performed according to the departmental dose-optimization program which includes automated exposure control, adjustment of the mA and/or kV according to patient size and/or use of iterative reconstruction technique. COMPARISON:  None Available. FINDINGS: CT HEAD FINDINGS Brain: No evidence of acute infarction, hemorrhage, hydrocephalus, extra-axial collection or mass lesion/mass effect. Vascular: No hyperdense vessel or unexpected calcification. Skull: Normal. Negative for fracture or focal lesion. Sinuses/Orbits: Bilateral paranasal sinus opacification especially affecting the maxillary sinuses. Negative orbits. Other: Retained BB near the left mandibular notch. CT CERVICAL SPINE FINDINGS Alignment: Normal. Skull base and vertebrae: No acute fracture. No primary bone lesion or focal pathologic process. Soft tissues and spinal canal: No prevertebral fluid or swelling. No visible canal hematoma. Disc levels:  No acute or aggressive finding. Upper chest: Reported separately IMPRESSION: No evidence of intracranial or cervical spine injury. Electronically Signed   By: Jorje Guild M.D.   On: 01/13/2023 07:13   CT Cervical Spine Wo Contrast  Result Date: 01/13/2023 CLINICAL DATA:  Head trauma with abnormal mental status. Intoxication. Suicide attempt. EXAM: CT HEAD WITHOUT CONTRAST CT CERVICAL SPINE WITHOUT CONTRAST TECHNIQUE: Multidetector CT imaging of the head and cervical spine was performed following the standard protocol without intravenous contrast. Multiplanar CT image reconstructions of the cervical spine were also generated. RADIATION DOSE REDUCTION: This exam was performed according to the departmental dose-optimization program which includes automated exposure control, adjustment of the mA  and/or kV according to patient size and/or use of iterative reconstruction technique. COMPARISON:  None Available. FINDINGS: CT HEAD FINDINGS Brain: No evidence of acute infarction, hemorrhage, hydrocephalus, extra-axial collection or mass lesion/mass effect. Vascular: No hyperdense vessel or unexpected calcification. Skull: Normal. Negative for fracture or focal lesion. Sinuses/Orbits: Bilateral paranasal sinus opacification especially affecting the maxillary sinuses. Negative orbits. Other: Retained BB near the left mandibular notch. CT CERVICAL SPINE FINDINGS Alignment: Normal. Skull base and vertebrae: No acute fracture. No primary bone lesion or focal pathologic process. Soft tissues and spinal canal: No prevertebral fluid or swelling. No visible canal hematoma. Disc levels:  No acute or aggressive finding. Upper chest: Reported separately IMPRESSION: No evidence of intracranial or cervical spine injury. Electronically Signed   By: Jorje Guild M.D.   On: 01/13/2023 07:13   DG Chest Port 1 View  Result Date: 01/13/2023 CLINICAL DATA:  Fall. EXAM: PORTABLE CHEST 1 VIEW COMPARISON:  01/06/2023. FINDINGS: The heart size and mediastinal contours are within normal limits. Lung volumes are low. No consolidation, effusion, or pneumothorax. No  acute osseous abnormality. IMPRESSION: No active disease. Electronically Signed   By: Brett Fairy M.D.   On: 01/13/2023 05:01   DG Femur Portable Min 2 Views Right  Result Date: 01/13/2023 CLINICAL DATA:  Fall. EXAM: RIGHT FEMUR PORTABLE 2 VIEW; PORTABLE PELVIS 1-2 VIEWS COMPARISON:  None Available. FINDINGS: Pelvis: No acute fracture or dislocation is seen. The soft tissues are within normal limits. Right femur: There is no evidence of fracture or other focal bone lesions. Soft tissues are unremarkable. IMPRESSION: No acute fracture or dislocation in the pelvis or right femur. Electronically Signed   By: Brett Fairy M.D.   On: 01/13/2023 05:00   DG Pelvis  Portable  Result Date: 01/13/2023 CLINICAL DATA:  Fall. EXAM: RIGHT FEMUR PORTABLE 2 VIEW; PORTABLE PELVIS 1-2 VIEWS COMPARISON:  None Available. FINDINGS: Pelvis: No acute fracture or dislocation is seen. The soft tissues are within normal limits. Right femur: There is no evidence of fracture or other focal bone lesions. Soft tissues are unremarkable. IMPRESSION: No acute fracture or dislocation in the pelvis or right femur. Electronically Signed   By: Brett Fairy M.D.   On: 01/13/2023 05:00    Procedures Procedures    Medications Ordered in ED Medications  potassium chloride SA (KLOR-CON M) CR tablet 40 mEq (has no administration in time range)  iohexol (OMNIPAQUE) 350 MG/ML injection 75 mL (75 mLs Intravenous Contrast Given 01/13/23 [id number])    ED Course/ Medical Decision Making/ A&P                             Medical Decision Making Amount and/or Complexity of Data Reviewed Labs: ordered. Radiology: ordered.  Risk Prescription drug management.   Patient with history of alcohol abuse here for evaluation of self-reported jump off of a bridge at a height of 12 feet.  He states this was a suicide attempt.  Of note patient does not have external evidence of trauma and EMS reports that he got in a vehicle and traveled 3 miles from the site to then call EMS.  IVC was completed for patient's safety pending a further ED evaluation for injuries and acute suicidality.  Plain films are negative for acute fracture or dislocation.  Patient care transferred pending additional trauma workup.        Final Clinical Impression(s) / ED Diagnoses Final diagnoses:  None    Rx / DC Orders ED Discharge Orders     None         Quintella Reichert, MD 01/13/23 [phone]

## 2023-01-13 NOTE — ED Triage Notes (Signed)
Pt arrived via [facility] for c/c of leg pain. Per EMS pt jumped off [address] [place] around 0200, where he walked 3 miles with steady gait to EMS for help. EMS reports bridge is about 12 feet. Pt complaints of back and side pain, which EMS stated begin when he got into treatment room. Denies LOC. Pt is ETOH +. Pt states he jumped in attempt to harm self. Denies any other attempts tonight but does have history of SI Attempts. Pt stated he drunk "a lot". Pt stated he waited for cars to pass by before jumping.   106/70, 99HR, 97%, CBG 110

## 2023-01-13 NOTE — ED Notes (Signed)
Ivc paperwork rescinded  by debbie

## 2023-01-22 ENCOUNTER — Other Ambulatory Visit: Payer: Self-pay

## 2023-01-22 ENCOUNTER — Emergency Department (HOSPITAL_COMMUNITY): Payer: Medicaid Other

## 2023-01-22 ENCOUNTER — Emergency Department (HOSPITAL_COMMUNITY)
Admission: EM | Admit: 2023-01-22 | Discharge: 2023-01-22 | Disposition: A | Discharge status: Home / Self Care | Payer: Medicaid Other | Attending: Emergency Medicine | Admitting: Emergency Medicine

## 2023-01-22 DIAGNOSIS — R079 Chest pain, unspecified: Secondary | ICD-10-CM

## 2023-01-22 DIAGNOSIS — F10920 Alcohol use, unspecified with intoxication, uncomplicated: Secondary | ICD-10-CM

## 2023-01-22 DIAGNOSIS — I509 Heart failure, unspecified: Secondary | ICD-10-CM | POA: Insufficient documentation

## 2023-01-22 DIAGNOSIS — Z21 Asymptomatic human immunodeficiency virus [HIV] infection status: Secondary | ICD-10-CM | POA: Insufficient documentation

## 2023-01-22 DIAGNOSIS — Y906 Blood alcohol level of 120-199 mg/100 ml: Secondary | ICD-10-CM | POA: Insufficient documentation

## 2023-01-22 LAB — BASIC METABOLIC PANEL
Anion gap: 13 (ref 5–15)
BUN: 17 mg/dL (ref 6–20)
CO2: 18 mmol/L — ABNORMAL LOW (ref 22–32)
Calcium: 8.6 mg/dL — ABNORMAL LOW (ref 8.9–10.3)
Chloride: 106 mmol/L (ref 98–111)
Creatinine, Ser: 1.06 mg/dL (ref 0.61–1.24)
GFR, Estimated: >60 mL/min (ref >60)
Glucose, Bld: 79 mg/dL (ref 70–99)
Potassium: 3.8 mmol/L (ref 3.5–5.1)
Sodium: 137 mmol/L (ref 135–145)

## 2023-01-22 LAB — CBC
HCT: 39.5 % (ref 39.0–52.0)
Hemoglobin: 13.7 g/dL (ref 13.0–17.0)
MCH: 31.8 pg (ref 26.0–34.0)
MCHC: 34.7 g/dL (ref 30.0–36.0)
MCV: 91.6 fL (ref 80.0–100.0)
Platelets: 287 10*3/uL (ref 150–400)
RBC: 4.31 MIL/uL (ref 4.22–5.81)
RDW: 14.1 % (ref 11.5–15.5)
WBC: 2.9 10*3/uL — ABNORMAL LOW (ref 4.0–10.5)
nRBC: 0 % (ref 0.0–0.2)

## 2023-01-22 LAB — TROPONIN I (HIGH SENSITIVITY)
Troponin I (High Sensitivity): 5 ng/L (ref <18)
Troponin I (High Sensitivity): 7 ng/L (ref <18)

## 2023-01-22 LAB — ETHANOL: Alcohol, Ethyl (B): 124 mg/dL — ABNORMAL HIGH (ref <10)

## 2023-01-22 LAB — BRAIN NATRIURETIC PEPTIDE: B Natriuretic Peptide: 9 pg/mL (ref 0.0–100.0)

## 2023-01-22 NOTE — ED Triage Notes (Signed)
Pt arrives via [facility], chest pain on the left side, has been bothering him today, hx of chf. Tired of dealing with it and having thoughts of harming himself today. 12 lead wnl, hr 80's, 130/80, sats 98% ra,no abnormal swelling of extremities. 324 asa given en route.

## 2023-01-22 NOTE — ED Provider Notes (Signed)
[facility] Provider Note   CSN: [id number] Arrival date & time: 01/22/23  0442     History  Chief Complaint  Patient presents with   Chest Pain    Clance Bendall Arevalo is a 42 y.o. male.  The history is provided by the patient, the EMS personnel and medical records. No language interpreter was used.  Chest Pain    42 year old male significant history of polysubstance abuse including alcohol abuse and drug abuse, CHF, HIV, dilated cardiomyopathy brought here via EMS from home for evaluation of chest pain.  I have a very difficult time obtaining history from patient as he is very sleepy and hard to arouse.  Patient did state that he has had pain in his chest for at least a week.  He experience pain on a daily basis.  States pain is more on the left side of his chest.  More of a sharp stabbing pain.  No history of this any associated symptoms such as shortness of breath diaphoresis lightheadedness nausea.  He said pain is worse today prompting this ER visit.  Per EMS note, patient states he is tired of dealing with the pain and has intermittent thoughts of harming himself today but none right now.  Patient denies any SI to me.  When asked if he have used any alcohol tobacco or drug products patient denies.  History is limited due to his current mental state.  Home Medications Prior to Admission medications   Medication Sig Start Date End Date Taking? Authorizing Provider  Potassium Chloride ER 20 MEQ TBCR Take 20 mEq by mouth daily at 6 (six) AM. Patient not taking: No sig reported 08/07/21 09/05/21  Reubin Milan, MD      Allergies    Bee venom, Zestril [lisinopril], and Bactrim [sulfamethoxazole-trimethoprim]    Review of Systems   Review of Systems  Cardiovascular:  Positive for chest pain.  All other systems reviewed and are negative.   Physical Exam Updated Vital Signs BP 103/79   Pulse 93   Temp 98 F (36.7 C)   Resp 16    SpO2 96%  Physical Exam Vitals and nursing note reviewed.  Constitutional:      General: He is not in acute distress.    Appearance: He is well-developed.     Comments: Sleepy, snoring, easily arousable, appears a bit "bothered" but appears to be in no acute distress.  HENT:     Head: Atraumatic.  Eyes:     Conjunctiva/sclera: Conjunctivae normal.  Musculoskeletal:     Cervical back: Neck supple.  Skin:    Findings: No rash.     ED Results / Procedures / Treatments   Labs (all labs ordered are listed, but only abnormal results are displayed) Labs Reviewed  BASIC METABOLIC PANEL  CBC  BRAIN NATRIURETIC PEPTIDE  TROPONIN I (HIGH SENSITIVITY)    EKG EKG Interpretation  Date/Time:  Thursday January 22 2023 05:39:32 EDT Ventricular Rate:  85 PR Interval:  176 QRS Duration: 95 QT Interval:  368 QTC Calculation: 438 R Axis:   66 Text Interpretation: Sinus rhythm Probable left ventricular hypertrophy ST elev, probable normal early repol pattern no stemi Confirmed by Wynona Dove ([id number]) on 01/22/2023 5:41:40 AM  Radiology No results found.  Procedures Procedures    Medications Ordered in ED Medications - No data to display  ED Course/ Medical Decision Making/ A&P  Medical Decision Making Amount and/or Complexity of Data Reviewed Labs: ordered. Radiology: ordered.   BP 103/79   Pulse 93   Temp 98 F (36.7 C)   Resp 16   SpO2 96%   41:38 AM  42 year old male significant history of polysubstance abuse including alcohol abuse and drug abuse, CHF, HIV, dilated cardiomyopathy brought here via EMS from home for evaluation of chest pain.  I have a very difficult time obtaining history from patient as he is very sleepy and hard to arouse.  Patient did state that he has had pain in his chest for at least a week.  He experience pain on a daily basis.  States pain is more on the left side of his chest.  More of a sharp stabbing pain.  No history  of this any associated symptoms such as shortness of breath diaphoresis lightheadedness nausea.  He said pain is worse today prompting this ER visit.  Per EMS note, patient states he is tired of dealing with the pain and has intermittent thoughts of harming himself today but none right now.  Patient denies any SI to me.  When asked if he have used any alcohol tobacco or drug products patient denies.  History is limited due to his current mental state.  On exam, patient is sleeping, arousable, is slurring his speech but when he is aroused he is able to speak in normal verbiage.  He does not have any reproducible chest wall tenderness.  Heart with normal rate and rhythm, lungs are clear to auscultation bilaterally abdomen is soft nontender, no peripheral edema. Vital signs reviewed and overall reassuring.  -Labs ordered, independently viewed and interpreted by me.  Labs remarkable for normal initial trop, 2nd trop pending.  UDS and ETOH pending -The patient was maintained on a cardiac monitor.  I personally viewed and interpreted the cardiac monitored which showed an underlying rhythm of: NSR -Imaging independently viewed and interpreted by me and I agree with radiologist's interpretation.  Result remarkable for CXR unremarkable -This patient presents to the ED for concern of chest pain, this involves an extensive number of treatment options, and is a complaint that carries with it a high risk of complications and morbidity.  The differential diagnosis includes drug induced cp, ACS, GERD, gastritis -Co morbidities that complicate the patient evaluation includes drug abuse, dilated cardiomyopathy -Treatment includes monitor -Reevaluation of the patient after these medicines showed that the patient stayed the same -PCP office notes or outside notes reviewed -pt sign out to oncoming provider who will f/u on labs and determine disposition         Final Clinical Impression(s) / ED Diagnoses Final  diagnoses:  None    Rx / DC Orders ED Discharge Orders     None         Fayrene Helper, PA-C 01/22/23 0647    Sloan Leiter, DO 01/23/23 [phone]

## 2023-01-22 NOTE — ED Provider Notes (Signed)
Care assumed from PA Fayrene HelperBowie Tran at shift change, please see his note for full details, but in brief William Powers is a 42 y.o. male who presented with chest pain occurring over the past week.  Patient noted to be somewhat sleepy on arrival, does have a history of alcohol abuse.  Patient is a poor historian.  Initial workup reassuring with EKG without ischemic changes and chest x-ray clear.  Initial troponin is normal.  At shift change plan of care is to follow-up on repeat troponin and pending ethanol level, if troponin is negative and patient is awake and able to ambulate anticipate discharge home.  EKG Interpretation  Date/Time:  Thursday January 22 2023 05:39:32 EDT Ventricular Rate:  85 PR Interval:  176 QRS Duration: 95 QT Interval:  368 QTC Calculation: 438 R Axis:   66 Text Interpretation: Sinus rhythm Probable left ventricular hypertrophy ST elev, probable normal early repol pattern no stemi Confirmed by Tanda RockersGray, Samuel ([id number]) on 01/22/2023 5:41:40 AM  Labs Reviewed  BASIC METABOLIC PANEL - Abnormal; Notable for the following components:      Result Value   CO2 18 (*)    Calcium 8.6 (*)    All other components within normal limits  CBC - Abnormal; Notable for the following components:   WBC 2.9 (*)    All other components within normal limits  ETHANOL - Abnormal; Notable for the following components:   Alcohol, Ethyl (B) 124 (*)    All other components within normal limits  BRAIN NATRIURETIC PEPTIDE  TROPONIN I (HIGH SENSITIVITY)  TROPONIN I (HIGH SENSITIVITY)   DG Chest 2 View  Result Date: 01/22/2023 CLINICAL DATA:  42 year old male with history of chest pain. EXAM: CHEST - 2 VIEW COMPARISON:  Chest x-ray 01/13/2023. FINDINGS: Lung volumes are normal. No consolidative airspace disease. No pleural effusions. No pneumothorax. No pulmonary nodule or mass noted. Pulmonary vasculature and the cardiomediastinal silhouette are within normal limits. IMPRESSION: No radiographic evidence  of acute cardiopulmonary disease. Electronically Signed   By: Trudie Reedaniel  Entrikin M.D.   On: 01/22/2023 05:54    MDM  Delta troponin without increase.  Patient's ethanol level is elevated at 124 which I suspect was the reason for his sleepiness on arrival.  Now he is more awake and alert.  Discussed reassuring results with patient.  Will discharge home with outpatient follow-up.  Return precautions discussed.   Dartha LodgeFord, Sharee Sturdy N, PA-C 01/22/23 0802    Sloan LeiterGray, Samuel A, DO 01/23/23 [phone]

## 2023-01-22 NOTE — ED Triage Notes (Signed)
Pt c/o pain in the left chest, does have some SOB, reports he was having thoughts of hurting himself earlier, but not having thoughts currently

## 2023-01-22 NOTE — Discharge Instructions (Signed)
You were seen in the emergency department today for chest pain. Your work-up in the emergency department has been overall reassuring. Your labs have been fairly normal and or similar to previous blood work you have had done. Your EKG and the enzyme we use to check your heart did not show an acute heart attack at this time. Your chest x-ray was normal.   We would like you to follow up closely with your primary care provider. Return to the ER immediately should you experience any new or worsening symptoms including but not limited to return of pain, worsened pain, vomiting, shortness of breath, dizziness, lightheadedness, passing out, or any other concerns that you may have.   

## 2023-01-24 ENCOUNTER — Emergency Department (HOSPITAL_COMMUNITY): Payer: Medicaid Other

## 2023-01-24 ENCOUNTER — Other Ambulatory Visit: Payer: Self-pay

## 2023-01-24 ENCOUNTER — Emergency Department (HOSPITAL_COMMUNITY)
Admission: EM | Admit: 2023-01-24 | Discharge: 2023-01-24 | Disposition: A | Discharge status: Home / Self Care | Payer: Medicaid Other | Attending: Emergency Medicine | Admitting: Emergency Medicine

## 2023-01-24 ENCOUNTER — Encounter (HOSPITAL_COMMUNITY): Payer: Self-pay

## 2023-01-24 DIAGNOSIS — R079 Chest pain, unspecified: Secondary | ICD-10-CM | POA: Insufficient documentation

## 2023-01-24 DIAGNOSIS — I110 Hypertensive heart disease with heart failure: Secondary | ICD-10-CM | POA: Insufficient documentation

## 2023-01-24 DIAGNOSIS — I509 Heart failure, unspecified: Secondary | ICD-10-CM | POA: Diagnosis not present

## 2023-01-24 DIAGNOSIS — Z21 Asymptomatic human immunodeficiency virus [HIV] infection status: Secondary | ICD-10-CM | POA: Diagnosis not present

## 2023-01-24 LAB — COMPREHENSIVE METABOLIC PANEL
ALT: 38 U/L (ref 0–44)
AST: 42 U/L — ABNORMAL HIGH (ref 15–41)
Albumin: 3.6 g/dL (ref 3.5–5.0)
Alkaline Phosphatase: 53 U/L (ref 38–126)
Anion gap: 12 (ref 5–15)
BUN: 14 mg/dL (ref 6–20)
CO2: 20 mmol/L — ABNORMAL LOW (ref 22–32)
Calcium: 8.5 mg/dL — ABNORMAL LOW (ref 8.9–10.3)
Chloride: 103 mmol/L (ref 98–111)
Creatinine, Ser: 1.03 mg/dL (ref 0.61–1.24)
GFR, Estimated: >60 mL/min (ref >60)
Glucose, Bld: 84 mg/dL (ref 70–99)
Potassium: 3.7 mmol/L (ref 3.5–5.1)
Sodium: 135 mmol/L (ref 135–145)
Total Bilirubin: 0.5 mg/dL (ref 0.3–1.2)
Total Protein: 8.8 g/dL — ABNORMAL HIGH (ref 6.5–8.1)

## 2023-01-24 LAB — CBC WITH DIFFERENTIAL/PLATELET
Abs Immature Granulocytes: 0.01 10*3/uL (ref 0.00–0.07)
Basophils Absolute: 0 10*3/uL (ref 0.0–0.1)
Basophils Relative: 1 %
Eosinophils Absolute: 0.1 10*3/uL (ref 0.0–0.5)
Eosinophils Relative: 2 %
HCT: 40.9 % (ref 39.0–52.0)
Hemoglobin: 14 g/dL (ref 13.0–17.0)
Immature Granulocytes: 0 %
Lymphocytes Relative: 33 %
Lymphs Abs: 1.2 10*3/uL (ref 0.7–4.0)
MCH: 31.5 pg (ref 26.0–34.0)
MCHC: 34.2 g/dL (ref 30.0–36.0)
MCV: 91.9 fL (ref 80.0–100.0)
Monocytes Absolute: 0.4 10*3/uL (ref 0.1–1.0)
Monocytes Relative: 10 %
Neutro Abs: 2 10*3/uL (ref 1.7–7.7)
Neutrophils Relative %: 54 %
Platelets: 263 10*3/uL (ref 150–400)
RBC: 4.45 MIL/uL (ref 4.22–5.81)
RDW: 13.9 % (ref 11.5–15.5)
WBC: 3.7 10*3/uL — ABNORMAL LOW (ref 4.0–10.5)
nRBC: 0 % (ref 0.0–0.2)

## 2023-01-24 LAB — BRAIN NATRIURETIC PEPTIDE: B Natriuretic Peptide: 11.7 pg/mL (ref 0.0–100.0)

## 2023-01-24 LAB — TROPONIN I (HIGH SENSITIVITY)
Troponin I (High Sensitivity): 7 ng/L (ref <18)
Troponin I (High Sensitivity): 5 ng/L (ref <18)

## 2023-01-24 LAB — LIPASE, BLOOD: Lipase: 37 U/L (ref 11–51)

## 2023-01-24 LAB — ETHANOL: Alcohol, Ethyl (B): 55 mg/dL — ABNORMAL HIGH (ref <10)

## 2023-01-24 NOTE — ED Provider Triage Note (Signed)
Emergency Medicine Provider Triage Evaluation Note  William Powers , a 42 y.o. male  was evaluated in triage.  Pt complains of shortness of breath and right-sided chest pain since 12 midnight and waiting for the bus.  Constant right-sided chest pain and shortness of breath feels like "fluid on me".  Admits to poor compliance with Lasix.  Pain not exertional or pleuritic.  No cough or fever.  No leg pain or leg swelling.  History of PE and milligram anticoagulation, HIV, heart failure with EF as low as 15% in 2019 now recovered to 55% on last echocardiogram in 2022  Review of Systems  Positive: Chest pain, shortness of breath  Negative: Fever, cough, leg swellin  Physical Exam  BP 127/87   Pulse 91   Temp (!) 97.4 F (36.3 C) (Oral)   Resp 17   Ht 6\' 4"  (1.93 m)   Wt 109 kg   SpO2 97%   BMI 29.25 kg/m  Gen:   Awake, no distress   Resp:  Normal effort  MSK:   Moves extremities without difficulty  Other:  Right chest wall tenderness  Medical Decision Making  Medically screening exam initiated at 6:15 AM.  Appropriate orders placed.  William Powers was informed that the remainder of the evaluation will be completed by another provider, this initial triage assessment does not replace that evaluation, and the importance of remaining in the ED until their evaluation is complete.  Chest pain and shortness of breath constant since 12 AM.   William Octave, MD 01/24/23 [phone]

## 2023-01-24 NOTE — Discharge Instructions (Addendum)
Return to the ED with any new or worsening signs or symptoms Please follow-up with PCP for reevaluation Please read attached guide concerning chest pain 

## 2023-01-24 NOTE — ED Provider Notes (Signed)
[place] EMERGENCY DEPARTMENT AT [facility] Provider Note   CSN: [id number] Arrival date & time: 01/24/23  0549     History  Chief Complaint  Patient presents with   Chest Pain    Patient to ED via EMS with a complaint of shortness of breath and chest pain x 5 hours. EMS reports giving 324 ASA. EMS report patient refused nitro. EMS reports patient has been non-compliant with medications and hasn't seen a doctor in a couple of years.    William Powers is a 42 y.o. male with medical history of pulmonary embolism without acute cor pulmonale, heart failure, HIV, hypertension, polysubstance abuse.  Patient presents to ED for evaluation of chest pain, shortness of breath.  Patient reports chest pain began around 12 AM.  Patient reports that he was "sitting" and doing nothing when the chest pain began.  Patient reports that chest pain is right-sided, does not radiate.  Patient unsure if his chest pain is worse with exertion.  Patient does endorse shortness of breath as well.  Patient reports history of chest pain however reports that today's chest pain is different "because it just is, its on the right".  Patient very sleepy on examination, when asked why he is so tired he responds "its 7 in the morning, wouldn't you be tired also".  Patient also endorsing nausea without vomiting.  Patient denies abdominal pain.  Denies alcohol use.  Denies leg swelling, lightheadedness, dizziness, weakness.   Chest Pain      Home Medications Prior to Admission medications   Medication Sig Start Date End Date Taking? Authorizing Provider  Potassium Chloride ER 20 MEQ TBCR Take 20 mEq by mouth daily at 6 (six) AM. Patient not taking: No sig reported 08/07/21 09/05/21  Bobette Mo, MD      Allergies    Bee venom, Zestril [lisinopril], and Bactrim [sulfamethoxazole-trimethoprim]    Review of Systems   Review of Systems  Cardiovascular:  Positive for chest pain.    Physical  Exam Updated Vital Signs BP 109/70   Pulse 89   Temp 98.9 F (37.2 C) (Oral)   Resp 19   Ht 6\' 4"  (1.93 m)   Wt 109 kg   SpO2 98%   BMI 29.25 kg/m  Physical Exam  ED Results / Procedures / Treatments   Labs (all labs ordered are listed, but only abnormal results are displayed) Labs Reviewed  CBC WITH DIFFERENTIAL/PLATELET - Abnormal; Notable for the following components:      Result Value   WBC 3.7 (*)    All other components within normal limits  COMPREHENSIVE METABOLIC PANEL - Abnormal; Notable for the following components:   CO2 20 (*)    Calcium 8.5 (*)    Total Protein 8.8 (*)    AST 42 (*)    All other components within normal limits  ETHANOL - Abnormal; Notable for the following components:   Alcohol, Ethyl (B) 55 (*)    All other components within normal limits  LIPASE, BLOOD  BRAIN NATRIURETIC PEPTIDE  RAPID URINE DRUG SCREEN, HOSP PERFORMED  TROPONIN I (HIGH SENSITIVITY)  TROPONIN I (HIGH SENSITIVITY)    EKG EKG Interpretation  Date/Time:  Saturday January 24 2023 06:13:14 EDT Ventricular Rate:  88 PR Interval:  168 QRS Duration: 99 QT Interval:  372 QTC Calculation: 451 R Axis:   56 Text Interpretation: Sinus rhythm ST elev, probable normal early repol pattern No significant change was found Confirmed by Glynn Octave [id number])  on 01/24/2023 6:18:08 AM  Radiology DG Chest 2 View  Result Date: 01/24/2023 CLINICAL DATA:  Chest pain. EXAM: CHEST - 2 VIEW COMPARISON:  AP Lat 01/22/2023 FINDINGS: The heart size and mediastinal contours are within normal limits. Both lungs are clear. The visualized skeletal structures are unremarkable. IMPRESSION: No active cardiopulmonary disease.  Unchanged. Electronically Signed   By: Almira Bar M.D.   On: 01/24/2023 06:29    Procedures Procedures   Medications Ordered in ED Medications - No data to display  ED Course/ Medical Decision Making/ A&P  Medical Decision Making   42 year old no presents to the ED  for evaluation of chest pain.  Please see HPI for further details.  On examination patient afebrile and nontachycardic.  Lung sounds clear bilaterally, he is not hypoxic.  Abdomen soft Lawsing throughout.  Neurological examination without focal neurodeficits.  No peripheral edema.  Alert and oriented x 4.  CBC without leukocytosis, anemia.  CMP with out electrolyte derangement, stable creatinine.  Lipase within normal limits.  Troponin 7, 5 with delta.  BNP not elevated at 11.7.  Ethanol at 55.  Patient allowed to rest for 5 hours, he is clinically sober at this time and ready for discharge.  Patient EKG is nonischemic, unchanged from prior.  Patient chest x-ray shows no consolidations or effusions.  On reassessment patient much more alert and oriented.  Patient reports that chest pain has resolved.  Patient requesting sandwich, ginger ale and bus pass.  These were provided.  The patient was given return precautions and he is voiced understanding.  The patient is had all his questions answered to his satisfaction.  The patient is stable to discharge home at this time.  Final Clinical Impression(s) / ED Diagnoses Final diagnoses:  Chest pain, unspecified type    Rx / DC Orders ED Discharge Orders     None         Al Decant, PA-C 01/24/23 1058    Melene Plan, DO 01/24/23 1236

## 2023-01-27 ENCOUNTER — Ambulatory Visit (HOSPITAL_COMMUNITY)
Admission: EM | Admit: 2023-01-27 | Discharge: 2023-01-27 | Disposition: A | Discharge status: Home / Self Care | Payer: No Typology Code available for payment source | Attending: Nurse Practitioner | Admitting: Nurse Practitioner

## 2023-01-27 ENCOUNTER — Other Ambulatory Visit: Payer: Self-pay

## 2023-01-27 DIAGNOSIS — F1090 Alcohol use, unspecified, uncomplicated: Secondary | ICD-10-CM

## 2023-01-27 DIAGNOSIS — Z5900 Homelessness unspecified: Secondary | ICD-10-CM | POA: Insufficient documentation

## 2023-01-27 DIAGNOSIS — F333 Major depressive disorder, recurrent, severe with psychotic symptoms: Secondary | ICD-10-CM | POA: Diagnosis not present

## 2023-01-27 DIAGNOSIS — Z1152 Encounter for screening for COVID-19: Secondary | ICD-10-CM | POA: Insufficient documentation

## 2023-01-27 DIAGNOSIS — F1410 Cocaine abuse, uncomplicated: Secondary | ICD-10-CM | POA: Insufficient documentation

## 2023-01-27 DIAGNOSIS — F1020 Alcohol dependence, uncomplicated: Secondary | ICD-10-CM | POA: Insufficient documentation

## 2023-01-27 DIAGNOSIS — Z765 Malingerer [conscious simulation]: Secondary | ICD-10-CM | POA: Insufficient documentation

## 2023-01-27 LAB — CBC WITH DIFFERENTIAL/PLATELET
Abs Immature Granulocytes: 0.02 10*3/uL (ref 0.00–0.07)
Basophils Absolute: 0 10*3/uL (ref 0.0–0.1)
Basophils Relative: 1 %
Eosinophils Absolute: 0.2 10*3/uL (ref 0.0–0.5)
Eosinophils Relative: 5 %
HCT: 40.2 % (ref 39.0–52.0)
Hemoglobin: 13.9 g/dL (ref 13.0–17.0)
Immature Granulocytes: 1 %
Lymphocytes Relative: 33 %
Lymphs Abs: 1 10*3/uL (ref 0.7–4.0)
MCH: 31.7 pg (ref 26.0–34.0)
MCHC: 34.6 g/dL (ref 30.0–36.0)
MCV: 91.8 fL (ref 80.0–100.0)
Monocytes Absolute: 0.3 10*3/uL (ref 0.1–1.0)
Monocytes Relative: 8 %
Neutro Abs: 1.6 10*3/uL — ABNORMAL LOW (ref 1.7–7.7)
Neutrophils Relative %: 52 %
Platelets: 267 10*3/uL (ref 150–400)
RBC: 4.38 MIL/uL (ref 4.22–5.81)
RDW: 13.8 % (ref 11.5–15.5)
WBC: 3.1 10*3/uL — ABNORMAL LOW (ref 4.0–10.5)
nRBC: 0 % (ref 0.0–0.2)

## 2023-01-27 LAB — COMPREHENSIVE METABOLIC PANEL
ALT: 32 U/L (ref 0–44)
AST: 37 U/L (ref 15–41)
Albumin: 3.4 g/dL — ABNORMAL LOW (ref 3.5–5.0)
Alkaline Phosphatase: 50 U/L (ref 38–126)
Anion gap: 12 (ref 5–15)
BUN: 14 mg/dL (ref 6–20)
CO2: 20 mmol/L — ABNORMAL LOW (ref 22–32)
Calcium: 8.8 mg/dL — ABNORMAL LOW (ref 8.9–10.3)
Chloride: 105 mmol/L (ref 98–111)
Creatinine, Ser: 0.92 mg/dL (ref 0.61–1.24)
GFR, Estimated: >60 mL/min (ref >60)
Glucose, Bld: 80 mg/dL (ref 70–99)
Potassium: 3.8 mmol/L (ref 3.5–5.1)
Sodium: 137 mmol/L (ref 135–145)
Total Bilirubin: 0.5 mg/dL (ref 0.3–1.2)
Total Protein: 7.8 g/dL (ref 6.5–8.1)

## 2023-01-27 LAB — SARS CORONAVIRUS 2 BY RT PCR: SARS Coronavirus 2 by RT PCR: NEGATIVE

## 2023-01-27 LAB — LIPID PANEL
Cholesterol: 145 mg/dL (ref 0–200)
HDL: 55 mg/dL (ref >40)
LDL Cholesterol: 59 mg/dL (ref 0–99)
Total CHOL/HDL Ratio: 2.6 RATIO
Triglycerides: 153 mg/dL — ABNORMAL HIGH (ref <150)
VLDL: 31 mg/dL (ref 0–40)

## 2023-01-27 LAB — ETHANOL: Alcohol, Ethyl (B): 141 mg/dL — ABNORMAL HIGH (ref <10)

## 2023-01-27 LAB — POC SARS CORONAVIRUS 2 AG: SARSCOV2ONAVIRUS 2 AG: NEGATIVE

## 2023-01-27 MED ORDER — ACETAMINOPHEN 325 MG PO TABS: 650.0000 mg | ORAL_TABLET | Freq: Four times a day (QID) | ORAL | Status: DC | PRN

## 2023-01-27 MED ORDER — HYDROXYZINE HCL 25 MG PO TABS
25.0000 mg | ORAL_TABLET | Freq: Three times a day (TID) | ORAL | Status: DC | PRN
  Administered 2023-01-27: 25 mg via ORAL
  Filled 2023-01-27: qty 1

## 2023-01-27 MED ORDER — TRAZODONE HCL 50 MG PO TABS
50.0000 mg | ORAL_TABLET | Freq: Every evening | ORAL | Status: DC | PRN
  Administered 2023-01-27: 50 mg via ORAL
  Filled 2023-01-27: qty 1

## 2023-01-27 MED ORDER — ALUM & MAG HYDROXIDE-SIMETH 200-200-20 MG/5ML PO SUSP: 30.0000 mL | ORAL | Status: DC | PRN

## 2023-01-27 MED ORDER — MAGNESIUM HYDROXIDE 400 MG/5ML PO SUSP: 30.0000 mL | Freq: Every day | ORAL | Status: DC | PRN
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
## 2023-01-27 NOTE — ED Notes (Signed)
Patient A&Ox4. Patient denies SI/HI and AVH. Patient denies any physical complaints when asked. No acute distress noted. Support and encouragement provided. Routine safety checks conducted according to facility protocol. Encouraged patient to notify staff if thoughts of harm toward self or others arise. Patient verbalize understanding and agreement. Will continue to monitor for safety.    
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
## 2023-01-27 NOTE — ED Provider Notes (Signed)
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 01/08/2023 None Detected  NONE DETECTED (Cut Off Level 10 ng/mL) Final  POC Oxazepam (BZO) 01/08/2023 None Detected  NONE DETECTED (Cut Off Level 300 ng/mL) Final   POC Cocaine UR 01/08/2023 Positive (A)  NONE DETECTED (Cut Off Level 300 ng/mL) Final   POC  Methamphetamine UR 01/08/2023 None Detected  NONE DETECTED (Cut Off Level 1000 ng/mL) Final   POC Morphine 01/08/2023 None Detected  NONE DETECTED (Cut Off Level 300 ng/mL) Final   POC Methadone UR 01/08/2023 None Detected  NONE DETECTED (Cut Off Level 300 ng/mL) Final   POC Oxycodone UR 01/08/2023 None Detected  NONE DETECTED (Cut Off Level 100 ng/mL) Final   POC Marijuana UR 01/08/2023 None Detected  NONE DETECTED (Cut Off Level 50 ng/mL) Final   WBC 01/08/2023 5.3  4.0 - 10.5 K/uL Final   RBC 01/08/2023 4.23  4.22 - 5.81 MIL/uL Final   Hemoglobin 01/08/2023 13.3  13.0 - 17.0 g/dL Final   HCT 57/84/6962 38.7 (L)  39.0 - 52.0 % Final   MCV 01/08/2023 91.5  80.0 - 100.0 fL Final   MCH 01/08/2023 31.4  26.0 - 34.0 pg Final   MCHC 01/08/2023 34.4  30.0 - 36.0 g/dL Final   RDW 95/28/4132 14.2  11.5 - 15.5 % Final   Platelets 01/08/2023 302  150 - 400 K/uL Final   nRBC 01/08/2023 0.0  0.0 - 0.2 % Final   Neutrophils Relative % 01/08/2023 61  % Final   Neutro Abs 01/08/2023 3.2  1.7 - 7.7 K/uL Final   Lymphocytes Relative 01/08/2023 27  % Final   Lymphs Abs 01/08/2023 1.4  0.7 - 4.0 K/uL Final   Monocytes Relative 01/08/2023 9  % Final   Monocytes Absolute 01/08/2023 0.5  0.1 - 1.0 K/uL Final   Eosinophils Relative 01/08/2023 3  % Final   Eosinophils Absolute 01/08/2023 0.1  0.0 - 0.5 K/uL Final   Basophils Relative 01/08/2023 0  % Final   Basophils Absolute 01/08/2023 0.0  0.0 - 0.1 K/uL Final   Immature Granulocytes 01/08/2023 0  % Final   Abs Immature Granulocytes 01/08/2023 0.01  0.00 - 0.07 K/uL Final   Performed at [facility], [address] [place], [place] [address]   Sodium 01/08/2023 139  135 - 145 mmol/L Final   Potassium 01/08/2023 3.7  3.5 - 5.1 mmol/L Final   Chloride 01/08/2023 103  98 - 111 mmol/L Final   CO2 01/08/2023 22  22 - 32 mmol/L Final   Glucose, Bld 01/08/2023 75  70 - 99 mg/dL Final   Glucose reference range applies only to samples taken after fasting  for at least 8 hours.   BUN 01/08/2023 17  6 - 20 mg/dL Final   Creatinine, Ser 01/08/2023 1.19  0.61 - 1.24 mg/dL Final   Calcium 27/25/3664 8.9  8.9 - 10.3 mg/dL Final   Total Protein 40/34/7425 8.3 (H)  6.5 - 8.1 g/dL Final   Albumin 95/63/8756 3.5  3.5 - 5.0 g/dL Final   AST 43/32/9518 46 (H)  15 - 41 U/L Final   ALT 01/08/2023 39  0 - 44 U/L Final   Alkaline Phosphatase 01/08/2023 61  38 - 126 U/L Final   Total Bilirubin 01/08/2023 0.7  0.3 - 1.2 mg/dL Final   GFR, Estimated 01/08/2023 >60  >60 mL/min Final   Comment: (NOTE) Calculated using the CKD-EPI Creatinine Equation (2021)    Anion gap 01/08/2023 14  5 - 15 Final   Performed at [facility], [address], [place], [place] [address]  SARSCOV2ONAVIRUS 2 AG 01/08/2023 NEGATIVE  NEGATIVE Final   Comment: (NOTE) SARS-CoV-2 antigen NOT DETECTED.   Negative results are presumptive.  Negative results do not preclude SARS-CoV-2 infection and should not be used as the sole basis for treatment or other patient management decisions, including infection  control decisions, particularly in the presence of clinical signs and  symptoms consistent with COVID-19, or in those who have been in contact with the virus.  Negative results must be combined with clinical observations, patient history, and epidemiological information. The expected result is Negative.  Fact Sheet for Patients: https://www.jennings-kim.com/  Fact Sheet for Healthcare Providers: https://alexander-rogers.biz/  This test is not yet approved or cleared by the [place] FDA and  has been authorized for detection and/or diagnosis of SARS-CoV-2 by FDA under an Emergency Use Authorization (EUA).  This EUA will remain in effect (meaning this test can be used) for the duration of  the COV                          ID-19 declaration under Section 564(b)(1) of the Act, 21 U.S.C. section 360bbb-3(b)(1), unless the authorization  is terminated or revoked sooner.    Admission on 01/05/2023, Discharged on 01/06/2023  Component Date Value Ref Range Status   WBC 01/06/2023 3.3 (L)  4.0 - 10.5 K/uL Final   RBC 01/06/2023 4.40  4.22 - 5.81 MIL/uL Final   Hemoglobin 01/06/2023 14.1  13.0 - 17.0 g/dL Final   HCT 16/07/9603 40.6  39.0 - 52.0 % Final   MCV 01/06/2023 92.3  80.0 - 100.0 fL Final   MCH 01/06/2023 32.0  26.0 - 34.0 pg Final   MCHC 01/06/2023 34.7  30.0 - 36.0 g/dL Final   RDW 54/06/8118 14.1  11.5 - 15.5 % Final   Platelets 01/06/2023 278  150 - 400 K/uL Final   nRBC 01/06/2023 0.0  0.0 - 0.2 % Final   Neutrophils Relative % 01/06/2023 52  % Final   Neutro Abs 01/06/2023 1.7  1.7 - 7.7 K/uL Final   Lymphocytes Relative 01/06/2023 36  % Final   Lymphs Abs 01/06/2023 1.2  0.7 - 4.0 K/uL Final   Monocytes Relative 01/06/2023 9  % Final   Monocytes Absolute 01/06/2023 0.3  0.1 - 1.0 K/uL Final   Eosinophils Relative 01/06/2023 2  % Final   Eosinophils Absolute 01/06/2023 0.1  0.0 - 0.5 K/uL Final   Basophils Relative 01/06/2023 1  % Final   Basophils Absolute 01/06/2023 0.0  0.0 - 0.1 K/uL Final   Immature Granulocytes 01/06/2023 0  % Final   Abs Immature Granulocytes 01/06/2023 0.01  0.00 - 0.07 K/uL Final   Performed at [facility], [address] [place], [place] [address]   Sodium 01/06/2023 136  135 - 145 mmol/L Final   Potassium 01/06/2023 3.1 (L)  3.5 - 5.1 mmol/L Final   Chloride 01/06/2023 102  98 - 111 mmol/L Final   CO2 01/06/2023 19 (L)  22 - 32 mmol/L Final   Glucose, Bld 01/06/2023 89  70 - 99 mg/dL Final   Glucose reference range applies only to samples taken after fasting for at least 8 hours.   BUN 01/06/2023 10  6 - 20 mg/dL Final   Creatinine, Ser 01/06/2023 1.03  0.61 - 1.24 mg/dL Final   Calcium 95/62/1308 8.6 (L)  8.9 - 10.3 mg/dL Final   Total Protein 65/78/4696 8.6 (H)  6.5 - 8.1 g/dL Final  Albumin 01/06/2023 3.7  3.5 - 5.0 g/dL Final   AST 16/07/9603 38  15 - 41 U/L  Final   ALT 01/06/2023 32  0 - 44 U/L Final   Alkaline Phosphatase 01/06/2023 54  38 - 126 U/L Final   Total Bilirubin 01/06/2023 0.7  0.3 - 1.2 mg/dL Final   GFR, Estimated 01/06/2023 >60  >60 mL/min Final   Comment: (NOTE) Calculated using the CKD-EPI Creatinine Equation (2021)    Anion gap 01/06/2023 15  5 - 15 Final   Performed at [facility], [address] [place], [place] [address]   Alcohol, Ethyl (B) 01/06/2023 128 (H)  <10 mg/dL Final   Comment: (NOTE) Lowest detectable limit for serum alcohol is 10 mg/dL.  For medical purposes only. Performed at [facility], [address], [place], [place] [address]    Troponin I (High Sensitivity) 01/06/2023 6  <18 ng/L Final   Comment: (NOTE) Elevated high sensitivity troponin I (hsTnI) values and significant  changes across serial measurements may suggest ACS but many other  chronic and acute conditions are known to elevate hsTnI results.  Refer to the "Links" section for chest pain algorithms and additional  guidance. Performed at [facility], [address], [place], [place] [address]    B Natriuretic Peptide 01/06/2023 14.7  0.0 - 100.0 pg/mL Final   Performed at [facility], [address], [place], [place] [address]   Troponin I (High Sensitivity) 01/06/2023 6  <18 ng/L Final   Comment: (NOTE) Elevated high sensitivity troponin I (hsTnI) values and significant  changes across serial measurements may suggest ACS but many other  chronic and acute conditions are known to elevate hsTnI results.  Refer to the "Links" section for chest pain algorithms and additional  guidance. Performed at [facility], [address] [place], [place] [address]   Admission on 01/02/2023, Discharged on 01/02/2023  Component Date Value Ref Range Status   Sodium 01/02/2023 136  135 - 145 mmol/L Final   Potassium 01/02/2023 4.3  3.5 - 5.1 mmol/L Final   HEMOLYSIS AT THIS LEVEL MAY AFFECT RESULT   Chloride  01/02/2023 104  98 - 111 mmol/L Final   CO2 01/02/2023 18 (L)  22 - 32 mmol/L Final   Glucose, Bld 01/02/2023 95  70 - 99 mg/dL Final   Glucose reference range applies only to samples taken after fasting for at least 8 hours.   BUN 01/02/2023 11  6 - 20 mg/dL Final   Creatinine, Ser 01/02/2023 1.10  0.61 - 1.24 mg/dL Final   Calcium 46/96/2952 8.6 (L)  8.9 - 10.3 mg/dL Final   Total Protein 84/13/2440 8.2 (H)  6.5 - 8.1 g/dL Final   Albumin 08/16/2535 3.5  3.5 - 5.0 g/dL Final   AST 64/40/3474 50 (H)  15 - 41 U/L Final   HEMOLYSIS AT THIS LEVEL MAY AFFECT RESULT   ALT 01/02/2023 29  0 - 44 U/L Final   HEMOLYSIS AT THIS LEVEL MAY AFFECT RESULT   Alkaline Phosphatase 01/02/2023 50  38 - 126 U/L Final   HEMOLYSIS AT THIS LEVEL MAY AFFECT RESULT   Total Bilirubin 01/02/2023 1.8 (H)  0.3 - 1.2 mg/dL Final   HEMOLYSIS AT THIS LEVEL MAY AFFECT RESULT   GFR, Estimated 01/02/2023 >60  >60 mL/min Final   Comment: (NOTE) Calculated using the CKD-EPI Creatinine Equation (2021)    Anion gap 01/02/2023 14  5 - 15 Final  Performed at [facility], [address], [place], [place] [address]   Alcohol, Ethyl (B) 01/02/2023 112 (H)  <10 mg/dL Final   Comment: (NOTE) Lowest detectable limit for serum alcohol is 10 mg/dL.  For medical purposes only. Performed at [facility], [address], [place], [place] [address]    Salicylate Lvl 01/02/2023 <7.0 (L)  7.0 - 30.0 mg/dL Final   Performed at [facility], [address] [place], [place] [address]   Acetaminophen (Tylenol), Serum 01/02/2023 <10 (L)  10 - 30 ug/mL Final   Comment: (NOTE) Therapeutic concentrations vary significantly. A range of 10-30 ug/mL  may be an effective concentration for many patients. However, some  are best treated at concentrations outside of this range. Acetaminophen concentrations >150 ug/mL at 4 hours after ingestion  and >50 ug/mL at 12 hours after ingestion are often associated with  toxic  reactions.  Performed at [facility], [address], [place], [place] [address]    WBC 01/02/2023 3.6 (L)  4.0 - 10.5 K/uL Final   RBC 01/02/2023 4.33  4.22 - 5.81 MIL/uL Final   Hemoglobin 01/02/2023 13.5  13.0 - 17.0 g/dL Final   HCT 29/56/2130 40.7  39.0 - 52.0 % Final   MCV 01/02/2023 94.0  80.0 - 100.0 fL Final   MCH 01/02/2023 31.2  26.0 - 34.0 pg Final   MCHC 01/02/2023 33.2  30.0 - 36.0 g/dL Final   RDW 86/57/8469 14.6  11.5 - 15.5 % Final   Platelets 01/02/2023 293  150 - 400 K/uL Final   nRBC 01/02/2023 0.0  0.0 - 0.2 % Final   Performed at [facility], [address] [place], [place] [address]   Opiates 01/02/2023 NONE DETECTED  NONE DETECTED Final   Cocaine 01/02/2023 NONE DETECTED  NONE DETECTED Final   Benzodiazepines 01/02/2023 NONE DETECTED  NONE DETECTED Final   Amphetamines 01/02/2023 NONE DETECTED  NONE DETECTED Final   Tetrahydrocannabinol 01/02/2023 NONE DETECTED  NONE DETECTED Final   Barbiturates 01/02/2023 NONE DETECTED  NONE DETECTED Final   Comment: (NOTE) DRUG SCREEN FOR MEDICAL PURPOSES ONLY.  IF CONFIRMATION IS NEEDED FOR ANY PURPOSE, NOTIFY LAB WITHIN 5 DAYS.  LOWEST DETECTABLE LIMITS FOR URINE DRUG SCREEN Drug Class                     Cutoff (ng/mL) Amphetamine and metabolites    1000 Barbiturate and metabolites    200 Benzodiazepine                 200 Opiates and metabolites        300 Cocaine and metabolites        300 THC                            50 Performed at [facility], [address], [place], [place] [address]    Glucose-Capillary 01/02/2023 111 (H)  70 - 99 mg/dL Final   Glucose reference range applies only to samples taken after fasting for at least 8 hours.   Acetaminophen (Tylenol), Serum 01/02/2023 <10 (L)  10 - 30 ug/mL Final   Comment: (NOTE) Therapeutic concentrations vary significantly. A range of 10-30 ug/mL  may be an effective concentration for many patients. However, some  are best treated  at concentrations outside of this range. Acetaminophen concentrations >150 ug/mL at 4 hours after ingestion  and >50 ug/mL at 12 hours after ingestion are often associated with  toxic reactions.  Performed at [facility], [address] [place], [place] [address]    Salicylate Lvl 01/02/2023 <7.0 (L)  7.0 - 30.0 mg/dL Final   Comment: HEMOLYSIS AT THIS LEVEL MAY AFFECT RESULT Performed at [facility], [address], [place], [place] [address]   Admission on 12/29/2022, Discharged on 12/29/2022  Component Date Value Ref Range Status   Sodium 12/29/2022 134 (L)  135 - 145 mmol/L Final   Potassium 12/29/2022 3.3 (L)  3.5 - 5.1 mmol/L Final   Chloride 12/29/2022 104  98 - 111 mmol/L Final   CO2 12/29/2022 21 (L)  22 - 32 mmol/L Final   Glucose, Bld 12/29/2022 105 (H)  70 - 99 mg/dL Final   Glucose reference range applies only to samples taken after fasting for at least 8 hours.   BUN 12/29/2022 8  6 - 20 mg/dL Final   Creatinine, Ser 12/29/2022 1.01  0.61 - 1.24 mg/dL Final   Calcium 30/86/5784 8.5 (L)  8.9 - 10.3 mg/dL Final   Total Protein 69/62/9528 9.1 (H)  6.5 - 8.1 g/dL Final   Albumin 41/32/4401 4.0  3.5 - 5.0 g/dL Final   AST 02/72/5366 33  15 - 41 U/L Final   ALT 12/29/2022 28  0 - 44 U/L Final   Alkaline Phosphatase 12/29/2022 52  38 - 126 U/L Final   Total Bilirubin 12/29/2022 0.8  0.3 - 1.2 mg/dL Final   GFR, Estimated 12/29/2022 >60  >60 mL/min Final   Comment: (NOTE) Calculated using the CKD-EPI Creatinine Equation (2021)    Anion gap 12/29/2022 9  5 - 15 Final   Performed at [facility], [address] [place], [place] [address]   Alcohol, Ethyl (B) 12/29/2022 113 (H)  <10 mg/dL Final   Comment: (NOTE) Lowest detectable limit for serum alcohol is 10 mg/dL.  For medical purposes only. Performed at [facility], [address], [place], [place] [address]    WBC 12/29/2022 3.2 (L)  4.0 - 10.5 K/uL Final   RBC  12/29/2022 4.41  4.22 - 5.81 MIL/uL Final   Hemoglobin 12/29/2022 13.8  13.0 - 17.0 g/dL Final   HCT 56/38/7564 40.8  39.0 - 52.0 % Final   MCV 12/29/2022 92.5  80.0 - 100.0 fL Final   MCH 12/29/2022 31.3  26.0 - 34.0 pg Final   MCHC 12/29/2022 33.8  30.0 - 36.0 g/dL Final   RDW 33/29/5188 14.6  11.5 - 15.5 % Final   Platelets 12/29/2022 300  150 - 400 K/uL Final   nRBC 12/29/2022 0.0  0.0 - 0.2 % Final   Neutrophils Relative % 12/29/2022 50  % Final   Neutro Abs 12/29/2022 1.6 (L)  1.7 - 7.7 K/uL Final   Lymphocytes Relative 12/29/2022 35  % Final   Lymphs Abs 12/29/2022 1.1  0.7 - 4.0 K/uL Final   Monocytes Relative 12/29/2022 10  % Final   Monocytes Absolute 12/29/2022 0.3  0.1 - 1.0 K/uL Final   Eosinophils Relative 12/29/2022 3  % Final   Eosinophils Absolute 12/29/2022 0.1  0.0 - 0.5 K/uL Final   Basophils Relative 12/29/2022 1  % Final   Basophils Absolute 12/29/2022 0.0  0.0 - 0.1 K/uL Final   Immature Granulocytes 12/29/2022 1  % Final   Abs Immature Granulocytes 12/29/2022 0.02  0.00 - 0.07 K/uL Final   Reactive, Benign  Lymphocytes 12/29/2022 PRESENT   Final   Performed at [facility], [address] [place], [place] [address]   SARS Coronavirus 2 by RT PCR 12/29/2022 NEGATIVE  NEGATIVE Final   Comment: (NOTE) SARS-CoV-2 target nucleic acids are NOT DETECTED.  The SARS-CoV-2 RNA is generally detectable in upper respiratory specimens during the acute phase of infection. The lowest concentration of SARS-CoV-2 viral copies this assay can detect is 138 copies/mL. A negative result does not preclude SARS-Cov-2 infection and should not be used as the sole basis for treatment or other patient management decisions. A negative result may occur with  improper specimen collection/handling, submission of specimen other than nasopharyngeal swab, presence of viral mutation(s) within the areas targeted by this assay, and inadequate number of viral copies(<138  copies/mL). A negative result must be combined with clinical observations, patient history, and epidemiological information. The expected result is Negative.  Fact Sheet for Patients:  BloggerCourse.com  Fact Sheet for Healthcare Providers:  SeriousBroker.it  This test is no                          t yet approved or cleared by the [place] FDA and  has been authorized for detection and/or diagnosis of SARS-CoV-2 by FDA under an Emergency Use Authorization (EUA). This EUA will remain  in effect (meaning this test can be used) for the duration of the COVID-19 declaration under Section 564(b)(1) of the Act, 21 U.S.C.section 360bbb-3(b)(1), unless the authorization is terminated  or revoked sooner.       Influenza A by PCR 12/29/2022 NEGATIVE  NEGATIVE Final   Influenza B by PCR 12/29/2022 NEGATIVE  NEGATIVE Final   Comment: (NOTE) The Xpert Xpress SARS-CoV-2/FLU/RSV plus assay is intended as an aid in the diagnosis of influenza from Nasopharyngeal swab specimens and should not be used as a sole basis for treatment. Nasal washings and aspirates are unacceptable for Xpert Xpress SARS-CoV-2/FLU/RSV testing.  Fact Sheet for Patients: BloggerCourse.com  Fact Sheet for Healthcare Providers: SeriousBroker.it  This test is not yet approved or cleared by the [place] FDA and has been authorized for detection and/or diagnosis of SARS-CoV-2 by FDA under an Emergency Use Authorization (EUA). This EUA will remain in effect (meaning this test can be used) for the duration of the COVID-19 declaration under Section 564(b)(1) of the Act, 21 U.S.C. section 360bbb-3(b)(1), unless the authorization is terminated or revoked.     Resp Syncytial Virus by PCR 12/29/2022 NEGATIVE  NEGATIVE Final   Comment: (NOTE) Fact Sheet for Patients: BloggerCourse.com  Fact  Sheet for Healthcare Providers: SeriousBroker.it  This test is not yet approved or cleared by the [place] FDA and has been authorized for detection and/or diagnosis of SARS-CoV-2 by FDA under an Emergency Use Authorization (EUA). This EUA will remain in effect (meaning this test can be used) for the duration of the COVID-19 declaration under Section 564(b)(1) of the Act, 21 U.S.C. section 360bbb-3(b)(1), unless the authorization is terminated or revoked.  Performed at [facility], [address] [place], [place] [address]   Admission on 12/24/2022, Discharged on 12/24/2022  Component Date Value Ref Range Status   Sodium 12/24/2022 136  135 - 145 mmol/L Final   Potassium 12/24/2022 3.6  3.5 - 5.1 mmol/L Final   Chloride 12/24/2022 108  98 - 111 mmol/L Final   CO2 12/24/2022 19 (L)  22 - 32 mmol/L Final   Glucose, Bld 12/24/2022 83  70 - 99 mg/dL Final   Glucose reference range applies only to samples taken after fasting for at least 8 hours.   BUN 12/24/2022 19  6 - 20 mg/dL Final   Creatinine, Ser 12/24/2022 1.01  0.61 - 1.24 mg/dL Final   Calcium 16/07/9603 8.1 (L)  8.9 - 10.3 mg/dL Final   GFR, Estimated 12/24/2022 >60  >60 mL/min Final   Comment: (NOTE) Calculated using the CKD-EPI Creatinine Equation (2021)    Anion gap 12/24/2022 9  5 - 15 Final   Performed at [facility], [address], [place], [place] [address]   WBC 12/24/2022 3.0 (L)  4.0 - 10.5 K/uL Final   RBC 12/24/2022 4.14 (L)  4.22 - 5.81 MIL/uL Final   Hemoglobin 12/24/2022 12.8 (L)  13.0 - 17.0 g/dL Final   HCT 11/91/4782 38.8 (L)  39.0 - 52.0 % Final   MCV 12/24/2022 93.7  80.0 - 100.0 fL Final   MCH 12/24/2022 30.9  26.0 - 34.0 pg Final   MCHC 12/24/2022 33.0  30.0 - 36.0 g/dL Final   RDW 95/62/1308 14.6  11.5 - 15.5 % Final   Platelets 12/24/2022 296  150 - 400 K/uL Final   nRBC 12/24/2022 0.0  0.0 - 0.2 % Final   Performed at [facility], [address], [place], [place] [address]   Troponin I (High Sensitivity) 12/24/2022 6  <18 ng/L Final   Comment: (NOTE) Elevated high sensitivity troponin I (hsTnI) values and significant  changes across serial measurements may suggest ACS but many other  chronic and acute conditions are known to elevate hsTnI results.  Refer to the "Links" section for chest pain algorithms and additional  guidance. Performed at [facility], [address], [place], [place] [address]    Alcohol, Ethyl (B) 12/24/2022 94 (H)  <10 mg/dL Final   Comment: (NOTE) Lowest detectable limit for serum alcohol is 10 mg/dL.  For medical purposes only. Performed at [facility], [address], [place], [place] [address]    Lipase 12/24/2022 35  11 - 51 U/L Final   Performed at [facility], [address], [place], [place] [address]   Opiates 12/24/2022 NONE DETECTED  NONE DETECTED Final   Cocaine 12/24/2022 NONE DETECTED  NONE DETECTED Final   Benzodiazepines 12/24/2022 NONE DETECTED  NONE DETECTED Final   Amphetamines 12/24/2022 NONE DETECTED  NONE DETECTED Final   Tetrahydrocannabinol 12/24/2022 POSITIVE (A)  NONE DETECTED Final   Barbiturates 12/24/2022 NONE DETECTED  NONE DETECTED Final   Comment: (NOTE) DRUG SCREEN FOR MEDICAL PURPOSES ONLY.  IF CONFIRMATION IS NEEDED FOR ANY PURPOSE, NOTIFY LAB WITHIN 5 DAYS.  LOWEST DETECTABLE LIMITS FOR URINE DRUG SCREEN Drug Class                     Cutoff (ng/mL) Amphetamine and metabolites    1000 Barbiturate and metabolites    200 Benzodiazepine                 200 Opiates and metabolites        300 Cocaine and metabolites        300 THC                            50 Performed at [facility], [address] [place], [place] [address]    D-Dimer, Quant 12/24/2022 <0.27  0.00 -  0.50 ug/mL-FEU Final   Comment: (NOTE) At the manufacturer cut-off value  of 0.5 g/mL FEU, this assay has a negative predictive value of 95-100%.This assay is intended for use in conjunction with a clinical pretest probability (PTP) assessment model to exclude pulmonary embolism (PE) and deep venous thrombosis (DVT) in outpatients suspected of PE or DVT. Results should be correlated with clinical presentation. Performed at [facility], [address], [place], [place] [address]    Troponin I (High Sensitivity) 12/24/2022 6  <18 ng/L Final   Comment: (NOTE) Elevated high sensitivity troponin I (hsTnI) values and significant  changes across serial measurements may suggest ACS but many other  chronic and acute conditions are known to elevate hsTnI results.  Refer to the "Links" section for chest pain algorithms and additional  guidance. Performed at [facility], [address] [place], [place] [address]   Admission on 12/21/2022, Discharged on 12/22/2022  Component Date Value Ref Range Status   SARS Coronavirus 2 by RT PCR 12/22/2022 NEGATIVE  NEGATIVE Final   Influenza A by PCR 12/22/2022 NEGATIVE  NEGATIVE Final   Influenza B by PCR 12/22/2022 NEGATIVE  NEGATIVE Final   Comment: (NOTE) The Xpert Xpress SARS-CoV-2/FLU/RSV plus assay is intended as an aid in the diagnosis of influenza from Nasopharyngeal swab specimens and should not be used as a sole basis for treatment. Nasal washings and aspirates are unacceptable for Xpert Xpress SARS-CoV-2/FLU/RSV testing.  Fact Sheet for Patients: BloggerCourse.com  Fact Sheet for Healthcare Providers: SeriousBroker.it  This test is not yet approved or cleared by the [place] FDA and has been authorized for detection and/or diagnosis of SARS-CoV-2 by FDA under an Emergency Use Authorization (EUA). This EUA will remain in effect (meaning this test can be used) for the duration of the COVID-19 declaration under Section  564(b)(1) of the Act, 21 U.S.C. section 360bbb-3(b)(1), unless the authorization is terminated or revoked.     Resp Syncytial Virus by PCR 12/22/2022 NEGATIVE  NEGATIVE Final   Comment: (NOTE) Fact Sheet for Patients: BloggerCourse.com  Fact Sheet for Healthcare Providers: SeriousBroker.it  This test is not yet approved or cleared by the [place] FDA and has been authorized for detection and/or diagnosis of SARS-CoV-2 by FDA under an Emergency Use Authorization (EUA). This EUA will remain in effect (meaning this test can be used) for the duration of the COVID-19 declaration under Section 564(b)(1) of the Act, 21 U.S.C. section 360bbb-3(b)(1), unless the authorization is terminated or revoked.  Performed at [facility], [address], [place], [place] [address]    WBC 12/22/2022 3.6 (L)  4.0 - 10.5 K/uL Final   RBC 12/22/2022 4.45  4.22 - 5.81 MIL/uL Final   Hemoglobin 12/22/2022 14.0  13.0 - 17.0 g/dL Final   HCT 91/47/8295 40.6  39.0 - 52.0 % Final   MCV 12/22/2022 91.2  80.0 - 100.0 fL Final   MCH 12/22/2022 31.5  26.0 - 34.0 pg Final   MCHC 12/22/2022 34.5  30.0 - 36.0 g/dL Final   RDW 62/13/0865 14.5  11.5 - 15.5 % Final   Platelets 12/22/2022 308  150 - 400 K/uL Final   nRBC 12/22/2022 0.0  0.0 - 0.2 % Final   Neutrophils Relative % 12/22/2022 45  % Final   Neutro Abs 12/22/2022 1.6 (L)  1.7 - 7.7 K/uL Final   Lymphocytes Relative 12/22/2022 41  % Final   Lymphs Abs 12/22/2022 1.5  0.7 - 4.0 K/uL Final  Monocytes Relative 12/22/2022 10  % Final   Monocytes Absolute 12/22/2022 0.4  0.1 - 1.0 K/uL Final   Eosinophils Relative 12/22/2022 2  % Final   Eosinophils Absolute 12/22/2022 0.1  0.0 - 0.5 K/uL Final   Basophils Relative 12/22/2022 1  % Final   Basophils Absolute 12/22/2022 0.0  0.0 - 0.1 K/uL Final   Immature Granulocytes 12/22/2022 1  % Final   Abs Immature Granulocytes 12/22/2022 0.02  0.00 - 0.07  K/uL Final   Performed at [facility], [address] [place], [place] [address]   Sodium 12/22/2022 137  135 - 145 mmol/L Final   Potassium 12/22/2022 3.5  3.5 - 5.1 mmol/L Final   Chloride 12/22/2022 102  98 - 111 mmol/L Final   CO2 12/22/2022 23  22 - 32 mmol/L Final   Glucose, Bld 12/22/2022 92  70 - 99 mg/dL Final   Glucose reference range applies only to samples taken after fasting for at least 8 hours.   BUN 12/22/2022 9  6 - 20 mg/dL Final   Creatinine, Ser 12/22/2022 1.00  0.61 - 1.24 mg/dL Final   Calcium 60/45/409803/01/2023 8.9  8.9 - 10.3 mg/dL Final   Total Protein 11/91/478203/01/2023 8.3 (H)  6.5 - 8.1 g/dL Final   Albumin 95/62/130803/01/2023 3.6  3.5 - 5.0 g/dL Final   AST 65/78/469603/01/2023 30  15 - 41 U/L Final   ALT 12/22/2022 23  0 - 44 U/L Final   Alkaline Phosphatase 12/22/2022 55  38 - 126 U/L Final   Total Bilirubin 12/22/2022 1.0  0.3 - 1.2 mg/dL Final   GFR, Estimated 12/22/2022 >60  >60 mL/min Final   Comment: (NOTE) Calculated using the CKD-EPI Creatinine Equation (2021)    Anion gap 12/22/2022 12  5 - 15 Final   Performed at [facility], [address] [place], [place] [address]   Alcohol, Ethyl (B) 12/22/2022 156 (H)  <10 mg/dL Final   Comment: (NOTE) Lowest detectable limit for serum alcohol is 10 mg/dL.  For medical purposes only. Performed at [facility], [address] [place], [place] [address]    SARSCOV2ONAVIRUS 2 AG 12/22/2022 NEGATIVE  NEGATIVE Final   Comment: (NOTE) SARS-CoV-2 antigen NOT DETECTED.   Negative results are presumptive.  Negative results do not preclude SARS-CoV-2 infection and should not be used as the sole basis for treatment or other patient management decisions, including infection  control decisions, particularly in the presence of clinical signs and  symptoms consistent with COVID-19, or in those who have been in contact with the virus.  Negative results must be combined with clinical observations, patient history, and  epidemiological information. The expected result is Negative.  Fact Sheet for Patients: https://www.jennings-kim.com/https://www.fda.gov/media/141569/download  Fact Sheet for Healthcare Providers: https://alexander-rogers.biz/https://www.fda.gov/media/141568/download  This test is not yet approved or cleared by the [place] FDA and  has been authorized for detection and/or diagnosis of SARS-CoV-2 by FDA under an Emergency Use Authorization (EUA).  This EUA will remain in effect (meaning this test can be used) for the duration of  the COV                          ID-19 declaration under Section 564(b)(1) of the Act, 21 U.S.C. section 360bbb-3(b)(1), unless the authorization is terminated or revoked sooner.    There may be more visits with results that are not included.    Allergies: Bee venom, Zestril [lisinopril], and Bactrim [  sulfamethoxazole-trimethoprim]  Medications: Patient denies taking any medications at this time  Medical Decision Making  Recommend for overnight observation in [facility] continuous obs unit and reeval for SI/HI/AVH in the morning.     Recommendations  Based on my evaluation the patient does not appear to have an emergency medical condition. Patient will be admitted to [facility] continuous observation for crisis management, safety and stabilization.   Jasper Riling, NP 01/27/23  5:18 AM

## 2023-01-27 NOTE — Discharge Instructions (Addendum)
Patient is instructed prior to discharge to:  Take all medications as prescribed by his/her mental healthcare provider. Report any adverse effects and or reactions from the medicines to his/her outpatient provider promptly. Keep all scheduled appointments, to ensure that you are getting refills on time and to avoid any interruption in your medication.  If you are unable to keep an appointment call to reschedule.  Be sure to follow-up with resources and follow-up appointments provided.  Patient has been instructed & cautioned: To not engage in alcohol and or illegal drug use while on prescription medicines. In the event of worsening symptoms, patient is instructed to call the crisis hotline, 911 and or go to the nearest ED for appropriate evaluation and treatment of symptoms. To follow-up with his/her primary care provider for your other medical issues, concerns and or health care needs.  Information: -National Suicide Prevention Lifeline 1-800-SUICIDE or (680)595-1294.  -988 offers 24/7 access to trained crisis counselors who can help people experiencing mental health-related distress. People can call or text 988 or chat 988lifeline.org for themselves or if they are worried about a loved one who may need crisis support.     Outpatient Services for Therapy and Medication Management for [facility] [address], [place], [address] [phone] phone  New Patient Assessment/Therapy Walk-ins Monday and Wednesday: 8am until slots are full. Every 1st and 2nd Friday: 1pm - 5pm  NO ASSESSMENT/THERAPY WALK-INS ON TUESDAYS OR THURSDAYS  New Patient Psychiatry/Medication Management Walk-ins Monday-Friday: 8am-11am  For all walk-ins, we ask that you arrive by 7:30am because patient will be seen in the order of arrival.  Availability is limited; therefore, you may not be seen on the same day that you walk-in.  Our goal is to serve and meet the needs of our  community to the best of our ability.   [facility] [address] [place], [place], [address] [phone] phone  [facility] [address] [place], [place], [address] [address] phone ([address] [employer] [address] - [place], [address], [employer], [place], [employer], [address], [employer] Healthy [place], [place], [address], [place], [place], [place], [place], [employer], [employer], [employer], [place])  [facility] [address], [address] [place], [place], [address] [phone] phone  [facility] [address], [address] [place], [place], [address] [phone] phone  [facility] [address] [place], [place], [address] [phone] phone  [facility] [address], [place], [address] [phone] phone  [facility], [place] [address], [place], [address] [phone] phone  [facility] [address] [place], [place], [address] [phone] phone [phone] fax  [facility] [address] [place], [place], [address] [phone] phone [phone] fax  [facility] [phone] [address] [place], [place], [address] [phone] phone  Jovita Kussmaul [address] [place], [place], [address]  [phone] phone  [facility]  (Adults Only) [address] [place], [place], [address]  [phone] phone [phone] fax    Homeless Shelter List:     [facility] [employer])  [address] [place], [place]  Phone: [phone]     [facility]  [address] [place], [place] [address]  Phone: [phone]     [facility] (Women only)  [address], [place], [place] [address]  Phone: [phone]     [facility]  [address], [place] [address]  Phone: [phone]     [facility]:  [phone]. [address]  [place], [place] [address]  Phone: [phone]     [facility]  [address] [place], [place] [address]  (Check in at 6:00PM for placement at a local shelter)  Phone: [phone]

## 2023-01-27 NOTE — ED Notes (Signed)
Patient A&O x 4, ambulatory. Patient discharged in no acute distress. Patient denied SI/HI, A/VH upon discharge. Patient verbalized understanding of all discharge instructions explained by staff, to include follow up appointments, RX's and safety plan. Patient reported mood 10/10.  Pt belongings returned to patient from locker # 1 intact. Patient escorted to lobby via staff for transport to destination. Safety maintained.  

## 2023-01-27 NOTE — ED Notes (Signed)
Pt sleeping at present, no distress noted.  Monitoring for safety. 
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
## 2023-01-27 NOTE — ED Notes (Signed)
Patient resting quietly in bed with eyes closed. Respirations equal and unlabored, skin warm and dry, NAD. Routine safety checks conducted according to facility protocol. Will continue to monitor for safety.  

## 2023-01-30 ENCOUNTER — Other Ambulatory Visit: Payer: Self-pay

## 2023-01-30 ENCOUNTER — Emergency Department (HOSPITAL_COMMUNITY)
Admission: EM | Admit: 2023-01-30 | Discharge: 2023-01-31 | Disposition: A | Discharge status: Other Acute Inpatient Hospital | Payer: No Typology Code available for payment source | Attending: Emergency Medicine | Admitting: Emergency Medicine

## 2023-01-30 ENCOUNTER — Encounter (HOSPITAL_COMMUNITY): Payer: Self-pay

## 2023-01-30 DIAGNOSIS — F332 Major depressive disorder, recurrent severe without psychotic features: Secondary | ICD-10-CM | POA: Diagnosis not present

## 2023-01-30 DIAGNOSIS — F1020 Alcohol dependence, uncomplicated: Secondary | ICD-10-CM | POA: Diagnosis not present

## 2023-01-30 DIAGNOSIS — Z21 Asymptomatic human immunodeficiency virus [HIV] infection status: Secondary | ICD-10-CM | POA: Diagnosis not present

## 2023-01-30 DIAGNOSIS — Z1152 Encounter for screening for COVID-19: Secondary | ICD-10-CM | POA: Diagnosis not present

## 2023-01-30 DIAGNOSIS — R45851 Suicidal ideations: Secondary | ICD-10-CM | POA: Diagnosis not present

## 2023-01-30 LAB — COMPREHENSIVE METABOLIC PANEL
ALT: 36 U/L (ref 0–44)
AST: 54 U/L — ABNORMAL HIGH (ref 15–41)
Albumin: 3.9 g/dL (ref 3.5–5.0)
Alkaline Phosphatase: 60 U/L (ref 38–126)
Anion gap: 8 (ref 5–15)
BUN: 14 mg/dL (ref 6–20)
CO2: 19 mmol/L — ABNORMAL LOW (ref 22–32)
Calcium: 8.6 mg/dL — ABNORMAL LOW (ref 8.9–10.3)
Chloride: 107 mmol/L (ref 98–111)
Creatinine, Ser: 1.01 mg/dL (ref 0.61–1.24)
GFR, Estimated: >60 mL/min (ref >60)
Glucose, Bld: 93 mg/dL (ref 70–99)
Potassium: 3.1 mmol/L — ABNORMAL LOW (ref 3.5–5.1)
Sodium: 134 mmol/L — ABNORMAL LOW (ref 135–145)
Total Bilirubin: 0.4 mg/dL (ref 0.3–1.2)
Total Protein: 9.1 g/dL — ABNORMAL HIGH (ref 6.5–8.1)

## 2023-01-30 LAB — RAPID URINE DRUG SCREEN, HOSP PERFORMED
Amphetamines: NOT DETECTED
Barbiturates: NOT DETECTED
Benzodiazepines: NOT DETECTED
Cocaine: NOT DETECTED
Opiates: NOT DETECTED
Tetrahydrocannabinol: NOT DETECTED

## 2023-01-30 LAB — CBC
HCT: 41.7 % (ref 39.0–52.0)
Hemoglobin: 14 g/dL (ref 13.0–17.0)
MCH: 31.3 pg (ref 26.0–34.0)
MCHC: 33.6 g/dL (ref 30.0–36.0)
MCV: 93.1 fL (ref 80.0–100.0)
Platelets: 296 10*3/uL (ref 150–400)
RBC: 4.48 MIL/uL (ref 4.22–5.81)
RDW: 13.9 % (ref 11.5–15.5)
WBC: 3.9 10*3/uL — ABNORMAL LOW (ref 4.0–10.5)
nRBC: 0 % (ref 0.0–0.2)

## 2023-01-30 LAB — SALICYLATE LEVEL: Salicylate Lvl: <7 mg/dL — ABNORMAL LOW (ref 7.0–30.0)

## 2023-01-30 LAB — ACETAMINOPHEN LEVEL: Acetaminophen (Tylenol), Serum: <10 ug/mL — ABNORMAL LOW (ref 10–30)

## 2023-01-30 LAB — ETHANOL: Alcohol, Ethyl (B): 178 mg/dL — ABNORMAL HIGH (ref <10)

## 2023-01-30 NOTE — ED Notes (Signed)
Pt belongings in 16-22 nurse station.   One white bag
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
## 2023-01-30 NOTE — ED Notes (Signed)
In addition to the blood ordered, I sent down a red and gold to the lab.

## 2023-01-31 ENCOUNTER — Other Ambulatory Visit: Payer: Self-pay

## 2023-01-31 ENCOUNTER — Ambulatory Visit (HOSPITAL_COMMUNITY)
Admission: EM | Admit: 2023-01-31 | Discharge: 2023-01-31 | Disposition: A | Discharge status: Home / Self Care | Payer: No Typology Code available for payment source | Attending: Family | Admitting: Family

## 2023-01-31 DIAGNOSIS — F1010 Alcohol abuse, uncomplicated: Secondary | ICD-10-CM | POA: Insufficient documentation

## 2023-01-31 DIAGNOSIS — Z5900 Homelessness unspecified: Secondary | ICD-10-CM | POA: Insufficient documentation

## 2023-01-31 DIAGNOSIS — R45851 Suicidal ideations: Secondary | ICD-10-CM | POA: Insufficient documentation

## 2023-01-31 DIAGNOSIS — F1420 Cocaine dependence, uncomplicated: Secondary | ICD-10-CM | POA: Insufficient documentation

## 2023-01-31 DIAGNOSIS — F331 Major depressive disorder, recurrent, moderate: Secondary | ICD-10-CM

## 2023-01-31 DIAGNOSIS — Y906 Blood alcohol level of 120-199 mg/100 ml: Secondary | ICD-10-CM | POA: Insufficient documentation

## 2023-01-31 DIAGNOSIS — Z9151 Personal history of suicidal behavior: Secondary | ICD-10-CM | POA: Insufficient documentation

## 2023-01-31 DIAGNOSIS — F1410 Cocaine abuse, uncomplicated: Secondary | ICD-10-CM

## 2023-01-31 LAB — SARS CORONAVIRUS 2 BY RT PCR: SARS Coronavirus 2 by RT PCR: NEGATIVE

## 2023-01-31 LAB — CBG MONITORING, ED: Glucose-Capillary: 94 mg/dL (ref 70–99)

## 2023-01-31 MED ORDER — ACETAMINOPHEN 325 MG PO TABS: 650.0000 mg | ORAL_TABLET | Freq: Four times a day (QID) | ORAL | Status: DC | PRN

## 2023-01-31 MED ORDER — TRAZODONE HCL 50 MG PO TABS: 50.0000 mg | ORAL_TABLET | Freq: Every evening | ORAL | Status: DC | PRN

## 2023-01-31 MED ORDER — HYDROXYZINE HCL 25 MG PO TABS: 25.0000 mg | ORAL_TABLET | Freq: Three times a day (TID) | ORAL | Status: DC | PRN

## 2023-01-31 MED ORDER — MAGNESIUM HYDROXIDE 400 MG/5ML PO SUSP: 30.0000 mL | Freq: Every day | ORAL | Status: DC | PRN

## 2023-01-31 MED ORDER — POTASSIUM CHLORIDE CRYS ER 20 MEQ PO TBCR
20.0000 meq | EXTENDED_RELEASE_TABLET | Freq: Once | ORAL | Status: AC
  Administered 2023-01-31: 20 meq via ORAL
  Filled 2023-01-31: qty 1

## 2023-01-31 MED ORDER — POTASSIUM CHLORIDE CRYS ER 20 MEQ PO TBCR
40.0000 meq | EXTENDED_RELEASE_TABLET | Freq: Once | ORAL | Status: AC
  Administered 2023-01-31: 40 meq via ORAL
  Filled 2023-01-31: qty 2

## 2023-01-31 MED ORDER — ALUM & MAG HYDROXIDE-SIMETH 200-200-20 MG/5ML PO SUSP: 30.0000 mL | ORAL | Status: DC | PRN

## 2023-01-31 NOTE — ED Notes (Signed)
Pt stated that he would like to be discharged.   Providers notified.

## 2023-01-31 NOTE — ED Provider Notes (Signed)
[facility] Continuous Assessment Admission H&P  Date: 01/31/23 Patient Name: William Powers MRN: [id number] Chief Complaint: Suicidal ideation  Diagnoses:  Final diagnoses:  Suicidal ideation  Alcohol abuse  MDD (major depressive disorder), recurrent episode, moderate  Cocaine use disorder    William Powers is a 42 y/o single male homeless with a psychiatric history of polysubstance abuse, major depressive disorder, alcohol use disorder, suicidal ideations presenting voluntarily to [facility] after being evaluated at [facility] ED by TTS.  Patient was recommended for overnight observation in [facility] due to suicidal ideations.  TTS-Ford Warrick-Pt has a history of major depressive disorder and substance use. Pt states he has felt severely depressed and suicidal "for a long time." He says tonight he attempted suicide by tying a cord around his neck and around a banister. The note from triage states Pt was trying to cut his neck and hang himself with a shoelace. Pt says he called law enforcement for help and asked them to shoot him. He says law enforcement recommended Pt come to [facility] for assistance.   Pt says, "My thoughts are really crazy." Pt acknowledges symptoms including crying spells, social withdrawal, loss of interest in usual pleasures, fatigue, irritability, decreased concentration, decreased sleep, and feelings of guilt, worthlessness and hopelessness. He says he has severe anxiety. He reports auditory hallucinations of voices telling him "murder, kill" and visual hallucinations of "devils and crazy things." He denies current homicidal ideation or history of aggression. He states he has attempted suicide in the past by ingesting bleach.   Pt reports he drinks 0.5-1 gallon of brown liquor almost daily. His blood alcohol level is 178. He denies any other recent substance use. Pt's medical record indicates Pt has a history of using marijuana, cocaine, fentanyl, and  methamphetamines. His urine drug screen is negative.  This nurse practitioner met with patient face-to-face and reviewed his chart.   Patient has had 24 ED visits in the last 6 months and has had many similar presentations in the past.  Patient reports that he would like long-term treatment for his alcoholism.  Patient reports that he drinks excessively to the point of blacking out and that he uses marijuana but has not used it in a while.  Patient states that he drinks a pint of [employer] daily.    On assessment patient is laying across 3 chairs in the assessment room and states that he has not had any sleep and he is very tired.  Patient endorses being depressed, sad, suicidal ideations, poor appetite and poor sleep.  Patient denies currently being on any medications.  Patient is alert oriented x 4, speech is clear and coherent, thought process is logical and goal directed.  Patient denies being in pain and does not appear to be in any immediate distress.  Patient does not appear to be responding to internal or external stimuli at this time does not display any psychosis, mania paranoia or delusional thought content.  Patient is not able to contract for safety and will be admitted to [facility] continuous assessment for crisis management, stabilization and safety.   Total Time spent with patient: 30 minutes  Musculoskeletal  Strength & Muscle Tone: within normal limits Gait & Station: unsteady Patient leans: N/A  Psychiatric Specialty Exam  Presentation General Appearance:  Casual  Eye Contact: Good  Speech: Clear and Coherent  Speech Volume: Normal  Handedness: Right   Mood and Affect  Mood: Euthymic  Affect: Appropriate  Thought Process  Thought Processes: Coherent  Descriptions of Associations:Intact  Orientation:Full (Time, Place and Person)  Thought Content:Logical  Diagnosis of Schizophrenia or Schizoaffective disorder in past: No  Duration of Psychotic  Symptoms: Greater than six months  Hallucinations:Hallucinations: None Description of Auditory Hallucinations: None Description of Visual Hallucinations: None  Ideas of Reference:None  Suicidal Thoughts:SI Active Intent and/or Plan: With Intent; With Plan SI Passive Intent and/or Plan: With Intent; With Plan  Homicidal Thoughts:Homicidal Thoughts: No HI Active Intent and/or Plan: Without Intent; Without Plan HI Passive Intent and/or Plan: Without Intent; Without Plan   Sensorium  Memory: Immediate Good; Recent Good; Remote Good  Judgment: Fair  Insight: Fair   Executive Functions  Concentration: Good  Attention Span: Good  Recall: Good  Fund of Knowledge: Good  Language: Good   Psychomotor Activity  Psychomotor Activity: Psychomotor Activity: Normal   Assets  Assets: Communication Skills; Desire for Improvement; Housing; Physical Health   Sleep  Sleep: Sleep: Poor Number of Hours of Sleep: -1   Nutritional Assessment (For OBS and FBC admissions only) Has the patient had a weight loss or gain of 10 pounds or more in the last 3 months?: No Has the patient had a decrease in food intake/or appetite?: No Does the patient have dental problems?: No Does the patient have eating habits or behaviors that may be indicators of an eating disorder including binging or inducing vomiting?: No Has the patient recently lost weight without trying?: 0 Has the patient been eating poorly because of a decreased appetite?: 0 Malnutrition Screening Tool Score: 0    Physical Exam HENT:     Head: Normocephalic and atraumatic.     Nose: Nose normal.  Eyes:     Pupils: Pupils are equal, round, and reactive to light.  Cardiovascular:     Rate and Rhythm: Normal rate.  Pulmonary:     Effort: Pulmonary effort is normal.  Abdominal:     General: Abdomen is flat.  Musculoskeletal:        General: Normal range of motion.  Skin:    General: Skin is warm.   Neurological:     Mental Status: He is alert and oriented to person, place, and time.  Psychiatric:        Attention and Perception: Attention normal.        Mood and Affect: Mood is depressed.        Speech: Speech normal.        Behavior: Behavior is cooperative.        Thought Content: Thought content includes suicidal ideation. Thought content includes suicidal plan.        Cognition and Memory: Cognition normal.        Judgment: Judgment is impulsive.    Review of Systems  Constitutional: Negative.   HENT: Negative.    Eyes: Negative.   Respiratory: Negative.    Cardiovascular: Negative.   Gastrointestinal: Negative.   Genitourinary: Negative.   Musculoskeletal: Negative.   Skin: Negative.   Neurological: Negative.   Endo/Heme/Allergies: Negative.   Psychiatric/Behavioral:  Positive for depression and suicidal ideas.     Blood pressure 116/89, pulse 95, temperature 98.5 F (36.9 C), temperature source Oral, resp. rate 16, SpO2 98 %. There is no height or weight on file to calculate BMI.  Past Psychiatric History: polysubstance abuse, alcohol use disorder, suicidal ideation   Is the patient at risk to self? Yes  Has the patient been a risk to self in the past 6 months?  Yes .    Has the patient been a risk to self within the distant past? Yes   Is the patient a risk to others? No   Has the patient been a risk to others in the past 6 months? No   Has the patient been a risk to others within the distant past? No   Past Medical History: Congestive heart failure, HIV, pulmonary embolism hypertension  Family History: Mother-HIV, Lupus, Father-HIV   Social History: 42 y/o male, HIV +divorced, unemployed,  homeless, receives disability for CHF  Last Labs:  Admission on 01/30/2023, Discharged on 01/31/2023  Component Date Value Ref Range Status   Sodium 01/30/2023 134 (L)  135 - 145 mmol/L Final   Potassium 01/30/2023 3.1 (L)  3.5 - 5.1 mmol/L Final   Chloride  01/30/2023 107  98 - 111 mmol/L Final   CO2 01/30/2023 19 (L)  22 - 32 mmol/L Final   Glucose, Bld 01/30/2023 93  70 - 99 mg/dL Final   Glucose reference range applies only to samples taken after fasting for at least 8 hours.   BUN 01/30/2023 14  6 - 20 mg/dL Final   Creatinine, Ser 01/30/2023 1.01  0.61 - 1.24 mg/dL Final   Calcium 16/07/9603 8.6 (L)  8.9 - 10.3 mg/dL Final   Total Protein 54/06/8118 9.1 (H)  6.5 - 8.1 g/dL Final   Albumin 14/78/2956 3.9  3.5 - 5.0 g/dL Final   AST 21/30/8657 54 (H)  15 - 41 U/L Final   ALT 01/30/2023 36  0 - 44 U/L Final   Alkaline Phosphatase 01/30/2023 60  38 - 126 U/L Final   Total Bilirubin 01/30/2023 0.4  0.3 - 1.2 mg/dL Final   GFR, Estimated 01/30/2023 >60  >60 mL/min Final   Comment: (NOTE) Calculated using the CKD-EPI Creatinine Equation (2021)    Anion gap 01/30/2023 8  5 - 15 Final   Performed at [facility], [address], [place], [place] [address]   Alcohol, Ethyl (B) 01/30/2023 178 (H)  <10 mg/dL Final   Comment: (NOTE) Lowest detectable limit for serum alcohol is 10 mg/dL.  For medical purposes only. Performed at [facility], [address], [place], [place] [address]    Salicylate Lvl 01/30/2023 <7.0 (L)  7.0 - 30.0 mg/dL Final   Performed at [facility], [address] [place], [place] [address]   Acetaminophen (Tylenol), Serum 01/30/2023 <10 (L)  10 - 30 ug/mL Final   Comment: (NOTE) Therapeutic concentrations vary significantly. A range of 10-30 ug/mL  may be an effective concentration for many patients. However, some  are best treated at concentrations outside of this range. Acetaminophen concentrations >150 ug/mL at 4 hours after ingestion  and >50 ug/mL at 12 hours after ingestion are often associated with  toxic reactions.  Performed at [facility], [address], [place], [place] [address]    WBC 01/30/2023 3.9 (L)  4.0 - 10.5 K/uL  Final   RBC 01/30/2023 4.48  4.22 - 5.81 MIL/uL Final   Hemoglobin 01/30/2023 14.0  13.0 - 17.0 g/dL Final   HCT 72/53/6644 41.7  39.0 - 52.0 % Final   MCV 01/30/2023 93.1  80.0 - 100.0 fL Final   MCH 01/30/2023 31.3  26.0 - 34.0 pg Final   MCHC 01/30/2023 33.6  30.0 - 36.0 g/dL Final   RDW 03/47/4259 13.9  11.5 - 15.5 % Final   Platelets 01/30/2023 296  150 - 400  K/uL Final   nRBC 01/30/2023 0.0  0.0 - 0.2 % Final   Performed at [facility], [address] [place], [place] [address]   Opiates 01/30/2023 NONE DETECTED  NONE DETECTED Final   Cocaine 01/30/2023 NONE DETECTED  NONE DETECTED Final   Benzodiazepines 01/30/2023 NONE DETECTED  NONE DETECTED Final   Amphetamines 01/30/2023 NONE DETECTED  NONE DETECTED Final   Tetrahydrocannabinol 01/30/2023 NONE DETECTED  NONE DETECTED Final   Barbiturates 01/30/2023 NONE DETECTED  NONE DETECTED Final   Comment: (NOTE) DRUG SCREEN FOR MEDICAL PURPOSES ONLY.  IF CONFIRMATION IS NEEDED FOR ANY PURPOSE, NOTIFY LAB WITHIN 5 DAYS.  LOWEST DETECTABLE LIMITS FOR URINE DRUG SCREEN Drug Class                     Cutoff (ng/mL) Amphetamine and metabolites    1000 Barbiturate and metabolites    200 Benzodiazepine                 200 Opiates and metabolites        300 Cocaine and metabolites        300 THC                            50 Performed at [facility], [address] [place], [place] [address]    Glucose-Capillary 01/30/2023 94  70 - 99 mg/dL Final   Glucose reference range applies only to samples taken after fasting for at least 8 hours.   SARS Coronavirus 2 by RT PCR 01/31/2023 NEGATIVE  NEGATIVE Final   Comment: (NOTE) SARS-CoV-2 target nucleic acids are NOT DETECTED.  The SARS-CoV-2 RNA is generally detectable in upper and lower respiratory specimens during the acute phase of infection. The lowest concentration of SARS-CoV-2 viral copies this assay can detect is 250 copies / mL. A  negative result does not preclude SARS-CoV-2 infection and should not be used as the sole basis for treatment or other patient management decisions.  A negative result may occur with improper specimen collection / handling, submission of specimen other than nasopharyngeal swab, presence of viral mutation(s) within the areas targeted by this assay, and inadequate number of viral copies (<250 copies / mL). A negative result must be combined with clinical observations, patient history, and epidemiological information.  Fact Sheet for Patients:   RoadLapTop.co.za  Fact Sheet for Healthcare Providers: http://kim-miller.com/  This test is not yet approved or                           cleared by the [place] FDA and has been authorized for detection and/or diagnosis of SARS-CoV-2 by FDA under an Emergency Use Authorization (EUA).  This EUA will remain in effect (meaning this test can be used) for the duration of the COVID-19 declaration under Section 564(b)(1) of the Act, 21 U.S.C. section 360bbb-3(b)(1), unless the authorization is terminated or revoked sooner.  Performed at [facility], [address], [place], [place] [address]   Admission on 01/27/2023, Discharged on 01/27/2023  Component Date Value Ref Range Status   SARS Coronavirus 2 by RT PCR 01/27/2023 NEGATIVE  NEGATIVE Final   Performed at [facility], [address] [place], [place] [address]   WBC 01/27/2023 3.1 (L)  4.0 - 10.5 K/uL Final   RBC 01/27/2023 4.38  4.22 - 5.81 MIL/uL Final   Hemoglobin  01/27/2023 13.9  13.0 - 17.0 g/dL Final   HCT 16/07/9603 40.2  39.0 - 52.0 % Final   MCV 01/27/2023 91.8  80.0 - 100.0 fL Final   MCH 01/27/2023 31.7  26.0 - 34.0 pg Final   MCHC 01/27/2023 34.6  30.0 - 36.0 g/dL Final   RDW 54/06/8118 13.8  11.5 - 15.5 % Final   Platelets 01/27/2023 267  150 - 400 K/uL Final   nRBC 01/27/2023 0.0  0.0 - 0.2 % Final    Neutrophils Relative % 01/27/2023 52  % Final   Neutro Abs 01/27/2023 1.6 (L)  1.7 - 7.7 K/uL Final   Lymphocytes Relative 01/27/2023 33  % Final   Lymphs Abs 01/27/2023 1.0  0.7 - 4.0 K/uL Final   Monocytes Relative 01/27/2023 8  % Final   Monocytes Absolute 01/27/2023 0.3  0.1 - 1.0 K/uL Final   Eosinophils Relative 01/27/2023 5  % Final   Eosinophils Absolute 01/27/2023 0.2  0.0 - 0.5 K/uL Final   Basophils Relative 01/27/2023 1  % Final   Basophils Absolute 01/27/2023 0.0  0.0 - 0.1 K/uL Final   Immature Granulocytes 01/27/2023 1  % Final   Abs Immature Granulocytes 01/27/2023 0.02  0.00 - 0.07 K/uL Final   Performed at [facility], [address] [place], [place] [address]   Sodium 01/27/2023 137  135 - 145 mmol/L Final   Potassium 01/27/2023 3.8  3.5 - 5.1 mmol/L Final   Chloride 01/27/2023 105  98 - 111 mmol/L Final   CO2 01/27/2023 20 (L)  22 - 32 mmol/L Final   Glucose, Bld 01/27/2023 80  70 - 99 mg/dL Final   Glucose reference range applies only to samples taken after fasting for at least 8 hours.   BUN 01/27/2023 14  6 - 20 mg/dL Final   Creatinine, Ser 01/27/2023 0.92  0.61 - 1.24 mg/dL Final   Calcium 95/62/1308 8.8 (L)  8.9 - 10.3 mg/dL Final   Total Protein 65/78/4696 7.8  6.5 - 8.1 g/dL Final   Albumin 29/52/8413 3.4 (L)  3.5 - 5.0 g/dL Final   AST 24/40/1027 37  15 - 41 U/L Final   ALT 01/27/2023 32  0 - 44 U/L Final   Alkaline Phosphatase 01/27/2023 50  38 - 126 U/L Final   Total Bilirubin 01/27/2023 0.5  0.3 - 1.2 mg/dL Final   GFR, Estimated 01/27/2023 >60  >60 mL/min Final   Comment: (NOTE) Calculated using the CKD-EPI Creatinine Equation (2021)    Anion gap 01/27/2023 12  5 - 15 Final   Performed at [facility], [address] [place], [place] [address]   Alcohol, Ethyl (B) 01/27/2023 141 (H)  <10 mg/dL Final   Comment: (NOTE) Lowest detectable limit for serum alcohol is 10 mg/dL.  For medical purposes only. Performed at [facility], [address] [place], [place] [address]    SARSCOV2ONAVIRUS 2 AG 01/27/2023 NEGATIVE  NEGATIVE Final   Comment: (NOTE) SARS-CoV-2 antigen NOT DETECTED.   Negative results are presumptive.  Negative results do not preclude SARS-CoV-2 infection and should not be used as the sole basis for treatment or other patient management decisions, including infection  control decisions, particularly in the presence of clinical signs and  symptoms consistent with COVID-19, or in those who have been in contact with the virus.  Negative results must be combined with clinical observations, patient history, and epidemiological information. The expected result is Negative.  Fact  Sheet for Patients: https://www.jennings-kim.com/  Fact Sheet for Healthcare Providers: https://alexander-rogers.biz/  This test is not yet approved or cleared by the [place] FDA and  has been authorized for detection and/or diagnosis of SARS-CoV-2 by FDA under an Emergency Use Authorization (EUA).  This EUA will remain in effect (meaning this test can be used) for the duration of  the COV                          ID-19 declaration under Section 564(b)(1) of the Act, 21 U.S.C. section 360bbb-3(b)(1), unless the authorization is terminated or revoked sooner.     Cholesterol 01/27/2023 145  0 - 200 mg/dL Final   Triglycerides 65/78/4696 153 (H)  <150 mg/dL Final   HDL 29/52/8413 55  >40 mg/dL Final   Total CHOL/HDL Ratio 01/27/2023 2.6  RATIO Final   VLDL 01/27/2023 31  0 - 40 mg/dL Final   LDL Cholesterol 01/27/2023 59  0 - 99 mg/dL Final   Comment:        Total Cholesterol/HDL:CHD Risk Coronary Heart Disease Risk Table                     Men   Women  1/2 Average Risk   3.4   3.3  Average Risk       5.0   4.4  2 X Average Risk   9.6   7.1  3 X Average Risk  23.4   11.0        Use the calculated Patient Ratio above and the CHD Risk Table to determine the patient's CHD  Risk.        ATP III CLASSIFICATION (LDL):  <100     mg/dL   Optimal  244-010  mg/dL   Near or Above                    Optimal  130-159  mg/dL   Borderline  272-536  mg/dL   High  >644     mg/dL   Very High Performed at [facility], [address] [place], [place] [address]   Admission on 01/24/2023, Discharged on 01/24/2023  Component Date Value Ref Range Status   WBC 01/24/2023 3.7 (L)  4.0 - 10.5 K/uL Final   RBC 01/24/2023 4.45  4.22 - 5.81 MIL/uL Final   Hemoglobin 01/24/2023 14.0  13.0 - 17.0 g/dL Final   HCT 25/95/6387 40.9  39.0 - 52.0 % Final   MCV 01/24/2023 91.9  80.0 - 100.0 fL Final   MCH 01/24/2023 31.5  26.0 - 34.0 pg Final   MCHC 01/24/2023 34.2  30.0 - 36.0 g/dL Final   RDW 56/43/3295 13.9  11.5 - 15.5 % Final   Platelets 01/24/2023 263  150 - 400 K/uL Final   nRBC 01/24/2023 0.0  0.0 - 0.2 % Final   Neutrophils Relative % 01/24/2023 54  % Final   Neutro Abs 01/24/2023 2.0  1.7 - 7.7 K/uL Final   Lymphocytes Relative 01/24/2023 33  % Final   Lymphs Abs 01/24/2023 1.2  0.7 - 4.0 K/uL Final   Monocytes Relative 01/24/2023 10  % Final   Monocytes Absolute 01/24/2023 0.4  0.1 - 1.0 K/uL Final   Eosinophils Relative 01/24/2023 2  % Final   Eosinophils Absolute 01/24/2023 0.1  0.0 - 0.5 K/uL Final   Basophils Relative 01/24/2023 1  % Final   Basophils Absolute 01/24/2023 0.0  0.0 - 0.1 K/uL Final   Immature Granulocytes 01/24/2023 0  % Final   Abs Immature Granulocytes 01/24/2023 0.01  0.00 - 0.07 K/uL Final   Performed at [facility], [address] [place], [place] [address]   Sodium 01/24/2023 135  135 - 145 mmol/L Final   Potassium 01/24/2023 3.7  3.5 - 5.1 mmol/L Final   Chloride 01/24/2023 103  98 - 111 mmol/L Final   CO2 01/24/2023 20 (L)  22 - 32 mmol/L Final   Glucose, Bld 01/24/2023 84  70 - 99 mg/dL Final   Glucose reference range applies only to samples taken after fasting for at least 8 hours.   BUN 01/24/2023 14  6 - 20 mg/dL Final    Creatinine, Ser 01/24/2023 1.03  0.61 - 1.24 mg/dL Final   Calcium 78/29/5621 8.5 (L)  8.9 - 10.3 mg/dL Final   Total Protein 30/86/5784 8.8 (H)  6.5 - 8.1 g/dL Final   Albumin 69/62/9528 3.6  3.5 - 5.0 g/dL Final   AST 41/32/4401 42 (H)  15 - 41 U/L Final   ALT 01/24/2023 38  0 - 44 U/L Final   Alkaline Phosphatase 01/24/2023 53  38 - 126 U/L Final   Total Bilirubin 01/24/2023 0.5  0.3 - 1.2 mg/dL Final   GFR, Estimated 01/24/2023 >60  >60 mL/min Final   Comment: (NOTE) Calculated using the CKD-EPI Creatinine Equation (2021)    Anion gap 01/24/2023 12  5 - 15 Final   Performed at [facility], [address], [place], [place] [address]   Lipase 01/24/2023 37  11 - 51 U/L Final   Performed at [facility], [address] [place], [place] [address]   Alcohol, Ethyl (B) 01/24/2023 55 (H)  <10 mg/dL Final   Comment: (NOTE) Lowest detectable limit for serum alcohol is 10 mg/dL.  For medical purposes only. Performed at [facility], [address], [place], [place] [address]    Troponin I (High Sensitivity) 01/24/2023 7  <18 ng/L Final   Comment: (NOTE) Elevated high sensitivity troponin I (hsTnI) values and significant  changes across serial measurements may suggest ACS but many other  chronic and acute conditions are known to elevate hsTnI results.  Refer to the "Links" section for chest pain algorithms and additional  guidance. Performed at [facility], [address] [place], [place] [address]    B Natriuretic Peptide 01/24/2023 11.7  0.0 - 100.0 pg/mL Final   Performed at [facility], [address], [place], [place] [address]   Troponin I (High Sensitivity) 01/24/2023 5  <18 ng/L Final   Comment: (NOTE) Elevated high sensitivity troponin I (hsTnI) values and significant  changes across serial measurements may suggest ACS but many other  chronic and acute conditions are known to elevate hsTnI results.  Refer to the "Links" section for  chest pain algorithms and additional  guidance. Performed at [facility], [address] [place], [place] [address]   Admission on 01/22/2023, Discharged on 01/22/2023  Component Date Value Ref Range Status   Sodium 01/22/2023 137  135 - 145 mmol/L Final   Potassium 01/22/2023 3.8  3.5 - 5.1 mmol/L Final   Chloride 01/22/2023 106  98 - 111 mmol/L Final   CO2 01/22/2023 18 (L)  22 - 32 mmol/L Final   Glucose, Bld 01/22/2023 79  70 - 99 mg/dL Final   Glucose reference range applies only to samples  taken after fasting for at least 8 hours.   BUN 01/22/2023 17  6 - 20 mg/dL Final   Creatinine, Ser 01/22/2023 1.06  0.61 - 1.24 mg/dL Final   Calcium 16/07/9603 8.6 (L)  8.9 - 10.3 mg/dL Final   GFR, Estimated 01/22/2023 >60  >60 mL/min Final   Comment: (NOTE) Calculated using the CKD-EPI Creatinine Equation (2021)    Anion gap 01/22/2023 13  5 - 15 Final   Performed at [facility], [address], [place], [place] [address]   WBC 01/22/2023 2.9 (L)  4.0 - 10.5 K/uL Final   RBC 01/22/2023 4.31  4.22 - 5.81 MIL/uL Final   Hemoglobin 01/22/2023 13.7  13.0 - 17.0 g/dL Final   HCT 11/91/4782 39.5  39.0 - 52.0 % Final   MCV 01/22/2023 91.6  80.0 - 100.0 fL Final   MCH 01/22/2023 31.8  26.0 - 34.0 pg Final   MCHC 01/22/2023 34.7  30.0 - 36.0 g/dL Final   RDW 95/62/1308 14.1  11.5 - 15.5 % Final   Platelets 01/22/2023 287  150 - 400 K/uL Final   nRBC 01/22/2023 0.0  0.0 - 0.2 % Final   Performed at [facility], [address], [place], [place] [address]   Troponin I (High Sensitivity) 01/22/2023 5  <18 ng/L Final   Comment: (NOTE) Elevated high sensitivity troponin I (hsTnI) values and significant  changes across serial measurements may suggest ACS but many other  chronic and acute conditions are known to elevate hsTnI results.  Refer to the "Links" section for chest pain algorithms and additional  guidance. Performed at [facility], [address]  [place], [place] [address]    B Natriuretic Peptide 01/22/2023 9.0  0.0 - 100.0 pg/mL Final   Performed at [facility], [address], [place], [place] [address]   Alcohol, Ethyl (B) 01/22/2023 124 (H)  <10 mg/dL Final   Comment: (NOTE) Lowest detectable limit for serum alcohol is 10 mg/dL.  For medical purposes only. Performed at [facility], [address], [place], [place] [address]    Troponin I (High Sensitivity) 01/22/2023 7  <18 ng/L Final   Comment: (NOTE) Elevated high sensitivity troponin I (hsTnI) values and significant  changes across serial measurements may suggest ACS but many other  chronic and acute conditions are known to elevate hsTnI results.  Refer to the "Links" section for chest pain algorithms and additional  guidance. Performed at [facility], [address] [place], [place] [address]   Admission on 01/13/2023, Discharged on 01/13/2023  Component Date Value Ref Range Status   Sodium 01/13/2023 134 (L)  135 - 145 mmol/L Final   Potassium 01/13/2023 2.9 (L)  3.5 - 5.1 mmol/L Final   Chloride 01/13/2023 102  98 - 111 mmol/L Final   CO2 01/13/2023 21 (L)  22 - 32 mmol/L Final   Glucose, Bld 01/13/2023 87  70 - 99 mg/dL Final   Glucose reference range applies only to samples taken after fasting for at least 8 hours.   BUN 01/13/2023 12  6 - 20 mg/dL Final   Creatinine, Ser 01/13/2023 0.96  0.61 - 1.24 mg/dL Final   Calcium 53/66/4403 8.4 (L)  8.9 - 10.3 mg/dL Final   Total Protein 47/42/5956 7.8  6.5 - 8.1 g/dL Final   Albumin 38/75/6433 3.3 (L)  3.5 - 5.0 g/dL Final   AST 29/51/8841 37  15 - 41 U/L Final  ALT 01/13/2023 28  0 - 44 U/L Final   Alkaline Phosphatase 01/13/2023 46  38 - 126 U/L Final   Total Bilirubin 01/13/2023 0.6  0.3 - 1.2 mg/dL Final   GFR, Estimated 01/13/2023 >60  >60 mL/min Final   Comment: (NOTE) Calculated using the CKD-EPI Creatinine Equation (2021)    Anion gap 01/13/2023 11  5 - 15 Final   Performed at [facility], [address] [place], [place] [address]   Alcohol, Ethyl (B) 01/13/2023 124 (H)  <10 mg/dL Final   Comment: (NOTE) Lowest detectable limit for serum alcohol is 10 mg/dL.  For medical purposes only. Performed at [facility], [address] [place], [place] [address]    WBC 01/13/2023 3.1 (L)  4.0 - 10.5 K/uL Final   RBC 01/13/2023 4.23  4.22 - 5.81 MIL/uL Final   Hemoglobin 01/13/2023 13.3  13.0 - 17.0 g/dL Final   HCT 19/14/7829 39.2  39.0 - 52.0 % Final   MCV 01/13/2023 92.7  80.0 - 100.0 fL Final   MCH 01/13/2023 31.4  26.0 - 34.0 pg Final   MCHC 01/13/2023 33.9  30.0 - 36.0 g/dL Final   RDW 56/21/3086 14.2  11.5 - 15.5 % Final   Platelets 01/13/2023 260  150 - 400 K/uL Final   nRBC 01/13/2023 0.0  0.0 - 0.2 % Final   Neutrophils Relative % 01/13/2023 49  % Final   Neutro Abs 01/13/2023 1.5 (L)  1.7 - 7.7 K/uL Final   Lymphocytes Relative 01/13/2023 38  % Final   Lymphs Abs 01/13/2023 1.2  0.7 - 4.0 K/uL Final   Monocytes Relative 01/13/2023 9  % Final   Monocytes Absolute 01/13/2023 0.3  0.1 - 1.0 K/uL Final   Eosinophils Relative 01/13/2023 4  % Final   Eosinophils Absolute 01/13/2023 0.1  0.0 - 0.5 K/uL Final   Basophils Relative 01/13/2023 0  % Final   Basophils Absolute 01/13/2023 0.0  0.0 - 0.1 K/uL Final   Immature Granulocytes 01/13/2023 0  % Final   Abs Immature Granulocytes 01/13/2023 0.01  0.00 - 0.07 K/uL Final   Performed at [facility], [address], [place], [place] [address]   Opiates 01/13/2023 NONE DETECTED  NONE DETECTED Final   Cocaine 01/13/2023 NONE DETECTED  NONE DETECTED Final   Benzodiazepines 01/13/2023 NONE DETECTED  NONE DETECTED Final   Amphetamines 01/13/2023 NONE DETECTED  NONE DETECTED Final   Tetrahydrocannabinol 01/13/2023 NONE DETECTED  NONE DETECTED Final   Barbiturates 01/13/2023 NONE DETECTED  NONE DETECTED Final   Comment: (NOTE) DRUG SCREEN FOR MEDICAL PURPOSES ONLY.  IF CONFIRMATION IS NEEDED FOR ANY  PURPOSE, NOTIFY LAB WITHIN 5 DAYS.  LOWEST DETECTABLE LIMITS FOR URINE DRUG SCREEN Drug Class                     Cutoff (ng/mL) Amphetamine and metabolites    1000 Barbiturate and metabolites    200 Benzodiazepine                 200 Opiates and metabolites        300 Cocaine and metabolites        300 THC                            50 Performed at [facility], [address], [place], [place] [address]    Acetaminophen (Tylenol), Serum 01/13/2023 <  10 (L)  10 - 30 ug/mL Final   Comment: (NOTE) Therapeutic concentrations vary significantly. A range of 10-30 ug/mL  may be an effective concentration for many patients. However, some  are best treated at concentrations outside of this range. Acetaminophen concentrations >150 ug/mL at 4 hours after ingestion  and >50 ug/mL at 12 hours after ingestion are often associated with  toxic reactions.  Performed at [facility], [address], [place], [place] [address]    Salicylate Lvl 01/13/2023 <7.0 (L)  7.0 - 30.0 mg/dL Final   Performed at [facility], [address] [place], [place] [address]  Admission on 01/07/2023, Discharged on 01/08/2023  Component Date Value Ref Range Status   SARS Coronavirus 2 by RT PCR 01/08/2023 NEGATIVE  NEGATIVE Final   Influenza A by PCR 01/08/2023 NEGATIVE  NEGATIVE Final   Influenza B by PCR 01/08/2023 NEGATIVE  NEGATIVE Final   Comment: (NOTE) The Xpert Xpress SARS-CoV-2/FLU/RSV plus assay is intended as an aid in the diagnosis of influenza from Nasopharyngeal swab specimens and should not be used as a sole basis for treatment. Nasal washings and aspirates are unacceptable for Xpert Xpress SARS-CoV-2/FLU/RSV testing.  Fact Sheet for Patients: BloggerCourse.com  Fact Sheet for Healthcare Providers: SeriousBroker.it  This test is not yet approved or cleared by the [place] FDA and has been authorized for detection  and/or diagnosis of SARS-CoV-2 by FDA under an Emergency Use Authorization (EUA). This EUA will remain in effect (meaning this test can be used) for the duration of the COVID-19 declaration under Section 564(b)(1) of the Act, 21 U.S.C. section 360bbb-3(b)(1), unless the authorization is terminated or revoked.     Resp Syncytial Virus by PCR 01/08/2023 NEGATIVE  NEGATIVE Final   Comment: (NOTE) Fact Sheet for Patients: BloggerCourse.com  Fact Sheet for Healthcare Providers: SeriousBroker.it  This test is not yet approved or cleared by the [place] FDA and has been authorized for detection and/or diagnosis of SARS-CoV-2 by FDA under an Emergency Use Authorization (EUA). This EUA will remain in effect (meaning this test can be used) for the duration of the COVID-19 declaration under Section 564(b)(1) of the Act, 21 U.S.C. section 360bbb-3(b)(1), unless the authorization is terminated or revoked.  Performed at [facility], [address], [place], [place] [address]    Alcohol, Ethyl (B) 01/08/2023 90 (H)  <10 mg/dL Final   Comment: (NOTE) Lowest detectable limit for serum alcohol is 10 mg/dL.  For medical purposes only. Performed at [facility], [address], [place], [place] [address]    POC Amphetamine UR 01/08/2023 None Detected  NONE DETECTED (Cut Off Level 1000 ng/mL) Final   POC Secobarbital (BAR) 01/08/2023 None Detected  NONE DETECTED (Cut Off Level 300 ng/mL) Final   POC Buprenorphine (BUP) 01/08/2023 None Detected  NONE DETECTED (Cut Off Level 10 ng/mL) Final   POC Oxazepam (BZO) 01/08/2023 None Detected  NONE DETECTED (Cut Off Level 300 ng/mL) Final   POC Cocaine UR 01/08/2023 Positive (A)  NONE DETECTED (Cut Off Level 300 ng/mL) Final   POC Methamphetamine UR 01/08/2023 None Detected  NONE DETECTED (Cut Off Level 1000 ng/mL) Final   POC Morphine 01/08/2023 None Detected  NONE DETECTED (Cut Off Level  300 ng/mL) Final   POC Methadone UR 01/08/2023 None Detected  NONE DETECTED (Cut Off Level 300 ng/mL) Final   POC Oxycodone UR 01/08/2023 None Detected  NONE DETECTED (Cut Off Level 100 ng/mL) Final   POC  Marijuana UR 01/08/2023 None Detected  NONE DETECTED (Cut Off Level 50 ng/mL) Final   WBC 01/08/2023 5.3  4.0 - 10.5 K/uL Final   RBC 01/08/2023 4.23  4.22 - 5.81 MIL/uL Final   Hemoglobin 01/08/2023 13.3  13.0 - 17.0 g/dL Final   HCT 40/98/1191 38.7 (L)  39.0 - 52.0 % Final   MCV 01/08/2023 91.5  80.0 - 100.0 fL Final   MCH 01/08/2023 31.4  26.0 - 34.0 pg Final   MCHC 01/08/2023 34.4  30.0 - 36.0 g/dL Final   RDW 47/82/9562 14.2  11.5 - 15.5 % Final   Platelets 01/08/2023 302  150 - 400 K/uL Final   nRBC 01/08/2023 0.0  0.0 - 0.2 % Final   Neutrophils Relative % 01/08/2023 61  % Final   Neutro Abs 01/08/2023 3.2  1.7 - 7.7 K/uL Final   Lymphocytes Relative 01/08/2023 27  % Final   Lymphs Abs 01/08/2023 1.4  0.7 - 4.0 K/uL Final   Monocytes Relative 01/08/2023 9  % Final   Monocytes Absolute 01/08/2023 0.5  0.1 - 1.0 K/uL Final   Eosinophils Relative 01/08/2023 3  % Final   Eosinophils Absolute 01/08/2023 0.1  0.0 - 0.5 K/uL Final   Basophils Relative 01/08/2023 0  % Final   Basophils Absolute 01/08/2023 0.0  0.0 - 0.1 K/uL Final   Immature Granulocytes 01/08/2023 0  % Final   Abs Immature Granulocytes 01/08/2023 0.01  0.00 - 0.07 K/uL Final   Performed at [facility], [address] [place], [place] [address]   Sodium 01/08/2023 139  135 - 145 mmol/L Final   Potassium 01/08/2023 3.7  3.5 - 5.1 mmol/L Final   Chloride 01/08/2023 103  98 - 111 mmol/L Final   CO2 01/08/2023 22  22 - 32 mmol/L Final   Glucose, Bld 01/08/2023 75  70 - 99 mg/dL Final   Glucose reference range applies only to samples taken after fasting for at least 8 hours.   BUN 01/08/2023 17  6 - 20 mg/dL Final   Creatinine, Ser 01/08/2023 1.19  0.61 - 1.24 mg/dL Final   Calcium 57/84/6962 8.9  8.9 - 10.3  mg/dL Final   Total Protein 95/28/4132 8.3 (H)  6.5 - 8.1 g/dL Final   Albumin 44/10/270 3.5  3.5 - 5.0 g/dL Final   AST 53/66/4403 46 (H)  15 - 41 U/L Final   ALT 01/08/2023 39  0 - 44 U/L Final   Alkaline Phosphatase 01/08/2023 61  38 - 126 U/L Final   Total Bilirubin 01/08/2023 0.7  0.3 - 1.2 mg/dL Final   GFR, Estimated 01/08/2023 >60  >60 mL/min Final   Comment: (NOTE) Calculated using the CKD-EPI Creatinine Equation (2021)    Anion gap 01/08/2023 14  5 - 15 Final   Performed at [facility], [address] [place], [place] [address]   SARSCOV2ONAVIRUS 2 AG 01/08/2023 NEGATIVE  NEGATIVE Final   Comment: (NOTE) SARS-CoV-2 antigen NOT DETECTED.   Negative results are presumptive.  Negative results do not preclude SARS-CoV-2 infection and should not be used as the sole basis for treatment or other patient management decisions, including infection  control decisions, particularly in the presence of clinical signs and  symptoms consistent with COVID-19, or in those who have been in contact with the virus.  Negative results must be combined with clinical observations, patient history, and epidemiological information. The expected result is Negative.  Fact Sheet for Patients: https://www.jennings-kim.com/  Fact Sheet  for Healthcare Providers: https://alexander-rogers.biz/  This test is not yet approved or cleared by the [place] and  has been authorized for detection and/or diagnosis of SARS-CoV-2 by FDA under an Emergency Use Authorization (EUA).  This EUA will remain in effect (meaning this test can be used) for the duration of  the COV                          ID-19 declaration under Section 564(b)(1) of the Act, 21 U.S.C. section 360bbb-3(b)(1), unless the authorization is terminated or revoked sooner.    Admission on 01/05/2023, Discharged on 01/06/2023  Component Date Value Ref Range Status   WBC 01/06/2023 3.3 (L)  4.0 - 10.5  K/uL Final   RBC 01/06/2023 4.40  4.22 - 5.81 MIL/uL Final   Hemoglobin 01/06/2023 14.1  13.0 - 17.0 g/dL Final   HCT 21/30/8657 40.6  39.0 - 52.0 % Final   MCV 01/06/2023 92.3  80.0 - 100.0 fL Final   MCH 01/06/2023 32.0  26.0 - 34.0 pg Final   MCHC 01/06/2023 34.7  30.0 - 36.0 g/dL Final   RDW 84/69/6295 14.1  11.5 - 15.5 % Final   Platelets 01/06/2023 278  150 - 400 K/uL Final   nRBC 01/06/2023 0.0  0.0 - 0.2 % Final   Neutrophils Relative % 01/06/2023 52  % Final   Neutro Abs 01/06/2023 1.7  1.7 - 7.7 K/uL Final   Lymphocytes Relative 01/06/2023 36  % Final   Lymphs Abs 01/06/2023 1.2  0.7 - 4.0 K/uL Final   Monocytes Relative 01/06/2023 9  % Final   Monocytes Absolute 01/06/2023 0.3  0.1 - 1.0 K/uL Final   Eosinophils Relative 01/06/2023 2  % Final   Eosinophils Absolute 01/06/2023 0.1  0.0 - 0.5 K/uL Final   Basophils Relative 01/06/2023 1  % Final   Basophils Absolute 01/06/2023 0.0  0.0 - 0.1 K/uL Final   Immature Granulocytes 01/06/2023 0  % Final   Abs Immature Granulocytes 01/06/2023 0.01  0.00 - 0.07 K/uL Final   Performed at [facility], [address], [place], [place] [address]   Sodium 01/06/2023 136  135 - 145 mmol/L Final   Potassium 01/06/2023 3.1 (L)  3.5 - 5.1 mmol/L Final   Chloride 01/06/2023 102  98 - 111 mmol/L Final   CO2 01/06/2023 19 (L)  22 - 32 mmol/L Final   Glucose, Bld 01/06/2023 89  70 - 99 mg/dL Final   Glucose reference range applies only to samples taken after fasting for at least 8 hours.   BUN 01/06/2023 10  6 - 20 mg/dL Final   Creatinine, Ser 01/06/2023 1.03  0.61 - 1.24 mg/dL Final   Calcium 24/40/1027 8.6 (L)  8.9 - 10.3 mg/dL Final   Total Protein 25/36/6440 8.6 (H)  6.5 - 8.1 g/dL Final   Albumin 34/74/2595 3.7  3.5 - 5.0 g/dL Final   AST 63/87/5643 38  15 - 41 U/L Final   ALT 01/06/2023 32  0 - 44 U/L Final   Alkaline Phosphatase 01/06/2023 54  38 - 126 U/L Final   Total Bilirubin 01/06/2023 0.7  0.3 - 1.2 mg/dL Final   GFR,  Estimated 01/06/2023 >60  >60 mL/min Final   Comment: (NOTE) Calculated using the CKD-EPI Creatinine Equation (2021)    Anion gap 01/06/2023 15  5 - 15 Final   Performed at [facility], [address] [place],  [place] [address]   Alcohol, Ethyl (B) 01/06/2023 128 (H)  <10 mg/dL Final   Comment: (NOTE) Lowest detectable limit for serum alcohol is 10 mg/dL.  For medical purposes only. Performed at [facility], [address], [place], [place] [address]    Troponin I (High Sensitivity) 01/06/2023 6  <18 ng/L Final   Comment: (NOTE) Elevated high sensitivity troponin I (hsTnI) values and significant  changes across serial measurements may suggest ACS but many other  chronic and acute conditions are known to elevate hsTnI results.  Refer to the "Links" section for chest pain algorithms and additional  guidance. Performed at [facility], [address], [place], [place] [address]    B Natriuretic Peptide 01/06/2023 14.7  0.0 - 100.0 pg/mL Final   Performed at [facility], [address] [place], [place] [address]   Troponin I (High Sensitivity) 01/06/2023 6  <18 ng/L Final   Comment: (NOTE) Elevated high sensitivity troponin I (hsTnI) values and significant  changes across serial measurements may suggest ACS but many other  chronic and acute conditions are known to elevate hsTnI results.  Refer to the "Links" section for chest pain algorithms and additional  guidance. Performed at [facility], [address] [place], [place] [address]   Admission on 01/02/2023, Discharged on 01/02/2023  Component Date Value Ref Range Status   Sodium 01/02/2023 136  135 - 145 mmol/L Final   Potassium 01/02/2023 4.3  3.5 - 5.1 mmol/L Final   HEMOLYSIS AT THIS LEVEL MAY AFFECT RESULT   Chloride 01/02/2023 104  98 - 111 mmol/L Final   CO2 01/02/2023 18 (L)  22 - 32 mmol/L Final   Glucose, Bld 01/02/2023 95  70 - 99 mg/dL Final   Glucose reference range applies  only to samples taken after fasting for at least 8 hours.   BUN 01/02/2023 11  6 - 20 mg/dL Final   Creatinine, Ser 01/02/2023 1.10  0.61 - 1.24 mg/dL Final   Calcium 36/64/4034 8.6 (L)  8.9 - 10.3 mg/dL Final   Total Protein 74/25/9563 8.2 (H)  6.5 - 8.1 g/dL Final   Albumin 87/56/4332 3.5  3.5 - 5.0 g/dL Final   AST 95/18/8416 50 (H)  15 - 41 U/L Final   HEMOLYSIS AT THIS LEVEL MAY AFFECT RESULT   ALT 01/02/2023 29  0 - 44 U/L Final   HEMOLYSIS AT THIS LEVEL MAY AFFECT RESULT   Alkaline Phosphatase 01/02/2023 50  38 - 126 U/L Final   HEMOLYSIS AT THIS LEVEL MAY AFFECT RESULT   Total Bilirubin 01/02/2023 1.8 (H)  0.3 - 1.2 mg/dL Final   HEMOLYSIS AT THIS LEVEL MAY AFFECT RESULT   GFR, Estimated 01/02/2023 >60  >60 mL/min Final   Comment: (NOTE) Calculated using the CKD-EPI Creatinine Equation (2021)    Anion gap 01/02/2023 14  5 - 15 Final   Performed at [facility], [address] [place], [place] [address]   Alcohol, Ethyl (B) 01/02/2023 112 (H)  <10 mg/dL Final   Comment: (NOTE) Lowest detectable limit for serum alcohol is 10 mg/dL.  For medical purposes only. Performed at [facility], [address] [place], [place] [address]    Salicylate Lvl 01/02/2023 <7.0 (L)  7.0 - 30.0 mg/dL Final   Performed at [facility], [address], [place], [place] [address]   Acetaminophen (Tylenol), Serum 01/02/2023 <10 (L)  10 - 30 ug/mL Final  Comment: (NOTE) Therapeutic concentrations vary significantly. A range of 10-30 ug/mL  may be an effective concentration for many patients. However, some  are best treated at concentrations outside of this range. Acetaminophen concentrations >150 ug/mL at 4 hours after ingestion  and >50 ug/mL at 12 hours after ingestion are often associated with  toxic reactions.  Performed at [facility], [address] [place], [place] [address]    WBC 01/02/2023 3.6 (L)  4.0 - 10.5 K/uL Final   RBC 01/02/2023 4.33  4.22 -  5.81 MIL/uL Final   Hemoglobin 01/02/2023 13.5  13.0 - 17.0 g/dL Final   HCT 60/45/4098 40.7  39.0 - 52.0 % Final   MCV 01/02/2023 94.0  80.0 - 100.0 fL Final   MCH 01/02/2023 31.2  26.0 - 34.0 pg Final   MCHC 01/02/2023 33.2  30.0 - 36.0 g/dL Final   RDW 11/91/4782 14.6  11.5 - 15.5 % Final   Platelets 01/02/2023 293  150 - 400 K/uL Final   nRBC 01/02/2023 0.0  0.0 - 0.2 % Final   Performed at [facility], [address] [place], [place] [address]   Opiates 01/02/2023 NONE DETECTED  NONE DETECTED Final   Cocaine 01/02/2023 NONE DETECTED  NONE DETECTED Final   Benzodiazepines 01/02/2023 NONE DETECTED  NONE DETECTED Final   Amphetamines 01/02/2023 NONE DETECTED  NONE DETECTED Final   Tetrahydrocannabinol 01/02/2023 NONE DETECTED  NONE DETECTED Final   Barbiturates 01/02/2023 NONE DETECTED  NONE DETECTED Final   Comment: (NOTE) DRUG SCREEN FOR MEDICAL PURPOSES ONLY.  IF CONFIRMATION IS NEEDED FOR ANY PURPOSE, NOTIFY LAB WITHIN 5 DAYS.  LOWEST DETECTABLE LIMITS FOR URINE DRUG SCREEN Drug Class                     Cutoff (ng/mL) Amphetamine and metabolites    1000 Barbiturate and metabolites    200 Benzodiazepine                 200 Opiates and metabolites        300 Cocaine and metabolites        300 THC                            50 Performed at [facility], [address] [place], [place] [address]    Glucose-Capillary 01/02/2023 111 (H)  70 - 99 mg/dL Final   Glucose reference range applies only to samples taken after fasting for at least 8 hours.   Acetaminophen (Tylenol), Serum 01/02/2023 <10 (L)  10 - 30 ug/mL Final   Comment: (NOTE) Therapeutic concentrations vary significantly. A range of 10-30 ug/mL  may be an effective concentration for many patients. However, some  are best treated at concentrations outside of this range. Acetaminophen concentrations >150 ug/mL at 4 hours after ingestion  and >50 ug/mL at 12 hours after ingestion are often associated  with  toxic reactions.  Performed at [facility], [address], [place], [place] [address]    Salicylate Lvl 01/02/2023 <7.0 (L)  7.0 - 30.0 mg/dL Final   Comment: HEMOLYSIS AT THIS LEVEL MAY AFFECT RESULT Performed at [facility], [address], [place], [place] [address]   Admission on 12/29/2022, Discharged on 12/29/2022  Component Date Value Ref Range Status   Sodium 12/29/2022 134 (L)  135 - 145 mmol/L Final   Potassium 12/29/2022 3.3 (L)  3.5 -  5.1 mmol/L Final   Chloride 12/29/2022 104  98 - 111 mmol/L Final   CO2 12/29/2022 21 (L)  22 - 32 mmol/L Final   Glucose, Bld 12/29/2022 105 (H)  70 - 99 mg/dL Final   Glucose reference range applies only to samples taken after fasting for at least 8 hours.   BUN 12/29/2022 8  6 - 20 mg/dL Final   Creatinine, Ser 12/29/2022 1.01  0.61 - 1.24 mg/dL Final   Calcium 16/07/9603 8.5 (L)  8.9 - 10.3 mg/dL Final   Total Protein 54/06/8118 9.1 (H)  6.5 - 8.1 g/dL Final   Albumin 14/78/2956 4.0  3.5 - 5.0 g/dL Final   AST 21/30/8657 33  15 - 41 U/L Final   ALT 12/29/2022 28  0 - 44 U/L Final   Alkaline Phosphatase 12/29/2022 52  38 - 126 U/L Final   Total Bilirubin 12/29/2022 0.8  0.3 - 1.2 mg/dL Final   GFR, Estimated 12/29/2022 >60  >60 mL/min Final   Comment: (NOTE) Calculated using the CKD-EPI Creatinine Equation (2021)    Anion gap 12/29/2022 9  5 - 15 Final   Performed at [facility], [address], [place], [place] [address]   Alcohol, Ethyl (B) 12/29/2022 113 (H)  <10 mg/dL Final   Comment: (NOTE) Lowest detectable limit for serum alcohol is 10 mg/dL.  For medical purposes only. Performed at [facility], [address] [place], [place] [address]    WBC 12/29/2022 3.2 (L)  4.0 - 10.5 K/uL Final   RBC 12/29/2022 4.41  4.22 - 5.81 MIL/uL Final   Hemoglobin 12/29/2022 13.8  13.0 - 17.0 g/dL Final   HCT 41/32/4401 40.8  39.0 - 52.0 % Final   MCV 12/29/2022 92.5  80.0 -  100.0 fL Final   MCH 12/29/2022 31.3  26.0 - 34.0 pg Final   MCHC 12/29/2022 33.8  30.0 - 36.0 g/dL Final   RDW 02/72/5366 14.6  11.5 - 15.5 % Final   Platelets 12/29/2022 300  150 - 400 K/uL Final   nRBC 12/29/2022 0.0  0.0 - 0.2 % Final   Neutrophils Relative % 12/29/2022 50  % Final   Neutro Abs 12/29/2022 1.6 (L)  1.7 - 7.7 K/uL Final   Lymphocytes Relative 12/29/2022 35  % Final   Lymphs Abs 12/29/2022 1.1  0.7 - 4.0 K/uL Final   Monocytes Relative 12/29/2022 10  % Final   Monocytes Absolute 12/29/2022 0.3  0.1 - 1.0 K/uL Final   Eosinophils Relative 12/29/2022 3  % Final   Eosinophils Absolute 12/29/2022 0.1  0.0 - 0.5 K/uL Final   Basophils Relative 12/29/2022 1  % Final   Basophils Absolute 12/29/2022 0.0  0.0 - 0.1 K/uL Final   Immature Granulocytes 12/29/2022 1  % Final   Abs Immature Granulocytes 12/29/2022 0.02  0.00 - 0.07 K/uL Final   Reactive, Benign Lymphocytes 12/29/2022 PRESENT   Final   Performed at [facility], [address] [place], [place] [address]   SARS Coronavirus 2 by RT PCR 12/29/2022 NEGATIVE  NEGATIVE Final   Comment: (NOTE) SARS-CoV-2 target nucleic acids are NOT DETECTED.  The SARS-CoV-2 RNA is generally detectable in upper respiratory specimens during the acute phase of infection. The lowest concentration of SARS-CoV-2 viral copies this assay can detect is 138 copies/mL. A negative result does not preclude SARS-Cov-2 infection and should not be used as the sole basis for treatment or other patient management decisions. A negative  result may occur with  improper specimen collection/handling, submission of specimen other than nasopharyngeal swab, presence of viral mutation(s) within the areas targeted by this assay, and inadequate number of viral copies(<138 copies/mL). A negative result must be combined with clinical observations, patient history, and epidemiological information. The expected result is Negative.  Fact Sheet  for Patients:  BloggerCourse.com  Fact Sheet for Healthcare Providers:  SeriousBroker.it  This test is no                          t yet approved or cleared by the [place] FDA and  has been authorized for detection and/or diagnosis of SARS-CoV-2 by FDA under an Emergency Use Authorization (EUA). This EUA will remain  in effect (meaning this test can be used) for the duration of the COVID-19 declaration under Section 564(b)(1) of the Act, 21 U.S.C.section 360bbb-3(b)(1), unless the authorization is terminated  or revoked sooner.       Influenza A by PCR 12/29/2022 NEGATIVE  NEGATIVE Final   Influenza B by PCR 12/29/2022 NEGATIVE  NEGATIVE Final   Comment: (NOTE) The Xpert Xpress SARS-CoV-2/FLU/RSV plus assay is intended as an aid in the diagnosis of influenza from Nasopharyngeal swab specimens and should not be used as a sole basis for treatment. Nasal washings and aspirates are unacceptable for Xpert Xpress SARS-CoV-2/FLU/RSV testing.  Fact Sheet for Patients: BloggerCourse.com  Fact Sheet for Healthcare Providers: SeriousBroker.it  This test is not yet approved or cleared by the [place] FDA and has been authorized for detection and/or diagnosis of SARS-CoV-2 by FDA under an Emergency Use Authorization (EUA). This EUA will remain in effect (meaning this test can be used) for the duration of the COVID-19 declaration under Section 564(b)(1) of the Act, 21 U.S.C. section 360bbb-3(b)(1), unless the authorization is terminated or revoked.     Resp Syncytial Virus by PCR 12/29/2022 NEGATIVE  NEGATIVE Final   Comment: (NOTE) Fact Sheet for Patients: BloggerCourse.com  Fact Sheet for Healthcare Providers: SeriousBroker.it  This test is not yet approved or cleared by the [place] FDA and has been authorized for  detection and/or diagnosis of SARS-CoV-2 by FDA under an Emergency Use Authorization (EUA). This EUA will remain in effect (meaning this test can be used) for the duration of the COVID-19 declaration under Section 564(b)(1) of the Act, 21 U.S.C. section 360bbb-3(b)(1), unless the authorization is terminated or revoked.  Performed at [facility], [address] [place], [place] [address]   Admission on 12/24/2022, Discharged on 12/24/2022  Component Date Value Ref Range Status   Sodium 12/24/2022 136  135 - 145 mmol/L Final   Potassium 12/24/2022 3.6  3.5 - 5.1 mmol/L Final   Chloride 12/24/2022 108  98 - 111 mmol/L Final   CO2 12/24/2022 19 (L)  22 - 32 mmol/L Final   Glucose, Bld 12/24/2022 83  70 - 99 mg/dL Final   Glucose reference range applies only to samples taken after fasting for at least 8 hours.   BUN 12/24/2022 19  6 - 20 mg/dL Final   Creatinine, Ser 12/24/2022 1.01  0.61 - 1.24 mg/dL Final   Calcium 60/45/4098 8.1 (L)  8.9 - 10.3 mg/dL Final   GFR, Estimated 12/24/2022 >60  >60 mL/min Final   Comment: (NOTE) Calculated using the CKD-EPI Creatinine Equation (2021)    Anion gap 12/24/2022 9  5 - 15 Final   Performed at [facility], [address]  [place], [place] [address]   WBC 12/24/2022 3.0 (L)  4.0 - 10.5 K/uL Final   RBC 12/24/2022 4.14 (L)  4.22 - 5.81 MIL/uL Final   Hemoglobin 12/24/2022 12.8 (L)  13.0 - 17.0 g/dL Final   HCT 19/14/7829 38.8 (L)  39.0 - 52.0 % Final   MCV 12/24/2022 93.7  80.0 - 100.0 fL Final   MCH 12/24/2022 30.9  26.0 - 34.0 pg Final   MCHC 12/24/2022 33.0  30.0 - 36.0 g/dL Final   RDW 56/21/3086 14.6  11.5 - 15.5 % Final   Platelets 12/24/2022 296  150 - 400 K/uL Final   nRBC 12/24/2022 0.0  0.0 - 0.2 % Final   Performed at [facility], [address] [place], [place] [address]   Troponin I (High Sensitivity) 12/24/2022 6  <18 ng/L Final   Comment: (NOTE) Elevated high  sensitivity troponin I (hsTnI) values and significant  changes across serial measurements may suggest ACS but many other  chronic and acute conditions are known to elevate hsTnI results.  Refer to the "Links" section for chest pain algorithms and additional  guidance. Performed at [facility], [address] [place], [place] [address]    Alcohol, Ethyl (B) 12/24/2022 94 (H)  <10 mg/dL Final   Comment: (NOTE) Lowest detectable limit for serum alcohol is 10 mg/dL.  For medical purposes only. Performed at [facility], [address] [place], [place] [address]    Lipase 12/24/2022 35  11 - 51 U/L Final   Performed at [facility], [address] [place], [place] [address]   Opiates 12/24/2022 NONE DETECTED  NONE DETECTED Final   Cocaine 12/24/2022 NONE DETECTED  NONE DETECTED Final   Benzodiazepines 12/24/2022 NONE DETECTED  NONE DETECTED Final   Amphetamines 12/24/2022 NONE DETECTED  NONE DETECTED Final   Tetrahydrocannabinol 12/24/2022 POSITIVE (A)  NONE DETECTED Final   Barbiturates 12/24/2022 NONE DETECTED  NONE DETECTED Final   Comment: (NOTE) DRUG SCREEN FOR MEDICAL PURPOSES ONLY.  IF CONFIRMATION IS NEEDED FOR ANY PURPOSE, NOTIFY LAB WITHIN 5 DAYS.  LOWEST DETECTABLE LIMITS FOR URINE DRUG SCREEN Drug Class                     Cutoff (ng/mL) Amphetamine and metabolites    1000 Barbiturate and metabolites    200 Benzodiazepine                 200 Opiates and metabolites        300 Cocaine and metabolites        300 THC                            50 Performed at [facility], [address] [place], [place] [address]    D-Dimer, Sharene Butters 12/24/2022 <0.27  0.00 - 0.50 ug/mL-FEU Final   Comment: (NOTE) At the manufacturer cut-off value of 0.5 g/mL FEU, this assay has a negative predictive value of 95-100%.This assay is intended for use in conjunction with a clinical pretest probability (PTP)  assessment model to exclude pulmonary embolism (PE) and deep venous thrombosis (DVT) in outpatients suspected of PE or DVT. Results should be correlated with clinical presentation. Performed at [facility], [address], [place], [place] [address]    Troponin I (High Sensitivity) 12/24/2022 6  <18 ng/L Final   Comment: (NOTE) Elevated high sensitivity troponin I (hsTnI)  values and significant  changes across serial measurements may suggest ACS but many other  chronic and acute conditions are known to elevate hsTnI results.  Refer to the "Links" section for chest pain algorithms and additional  guidance. Performed at [facility], [address] [place], [place] [address]   There may be more visits with results that are not included.    Allergies: Bee venom, Zestril [lisinopril], and Bactrim [sulfamethoxazole-trimethoprim]  Medications:  Facility Ordered Medications  Medication   [COMPLETED] potassium chloride SA (KLOR-CON M) CR tablet 40 mEq   acetaminophen (TYLENOL) tablet 650 mg   alum & mag hydroxide-simeth (MAALOX/MYLANTA) 200-200-20 MG/5ML suspension 30 mL   magnesium hydroxide (MILK OF MAGNESIA) suspension 30 mL   traZODone (DESYREL) tablet 50 mg   hydrOXYzine (ATARAX) tablet 25 mg   potassium chloride SA (KLOR-CON M) CR tablet 20 mEq    Medical Decision Making  Ansley Stanwood is a 42 y/o male with a psychiatric history of polysubstance abuse, major depressive disorder, alcohol use disorder, suicidal ideations presenting voluntarily to [facility] after being evaluated at [facility] ED by TTS.  Patient was recommended for overnight observation in [facility] due to suicidal ideations.    Recommendations  Based on my evaluation the patient does not appear to have an emergency medical condition. Patient will be admitted to [facility] continuous observation for crisis management, stabilization and safety.  Jasper Riling, NP 01/31/23   7:08 AM

## 2023-01-31 NOTE — BH Assessment (Signed)
Comprehensive Clinical Assessment (CCA) Note  01/31/2023 [name] [id number]  DISPOSITION: Gave clinical report to Roselyn Bering, NP who recommended Pt be transfer to [facility] for continuous assessment. AC at [facility] requested a COVID PCR test. Notified Dr Tilden Fossa and Kelvin Cellar, RN of recommendation via secure message.  The patient demonstrates the following risk factors for suicide: Chronic risk factors for suicide include: psychiatric disorder of major depressive disorder, substance use disorder, previous suicide attempts by ingesting bleach, and medical illness CHF, HTN, HIV . Acute risk factors for suicide include: unemployment, social withdrawal/isolation, and loss (financial, interpersonal, professional). Protective factors for this patient include: hope for the future. Considering these factors, the overall suicide risk at this point appears to be high. Patient is not appropriate for outpatient follow up.  Pt is a 42 year old divorced male who presents unaccompanied to [facility] ED voluntarily via Patent examiner. Pt has a history of major depressive disorder and substance use. Pt states he has felt severely depressed and suicidal "for a long time." He says tonight he attempted suicide by tying a cord around his neck and around a banister. The note from triage states Pt was trying to cut his neck and hang himself with a shoelace. Pt says he called law enforcement for help and asked them to shoot him. He says law enforcement recommended Pt come to [facility] for assistance.  Pt says, "My thoughts are really crazy." Pt acknowledges symptoms including crying spells, social withdrawal, loss of interest in usual pleasures, fatigue, irritability, decreased concentration, decreased sleep, and feelings of guilt, worthlessness and hopelessness. He says he has severe anxiety. He reports auditory hallucinations of voices telling him "murder, kill" and visual hallucinations of "devils and crazy things."  He denies current homicidal ideation or history of aggression. He states he has attempted suicide in the past by ingesting bleach.  Pt reports he drinks 0.5-1 gallon of brown liquor almost daily. His blood alcohol level is 178. He denies any other recent substance use. Pt's medical record indicates Pt has a history of using marijuana, cocaine, fentanyl, and methamphetamines. His urine drug screen is negative.  Pt says he stays in hotels and sometimes with his aunt. He has a history of homelessness. He says he is on disability due to a heart condition and states he receives $946 a month. He explains he will spend at least $500-600 of his check on alcohol and they does not have money for a place to stay or basic needs. He says he cannot work or he will lose his disability check and therefore feels trapped in a cycle of addiction and poverty. He says he is from [place] and came to [place] to stay with a friend, but that the situation did not work out. He cannot identify anyone in his life who is supportive. He denies history of abuse. He denies current legal problems. He says he has access to a gun.   Per medical record, he has presented to emergency services 20 times in in 2024. He says he has no mental health providers. He states he has been prescribed medications for HTN, his heart condition, and HIV but stopped taking all medications in 2022. He believes he is healthy and does not need medication. He denies having received substance abuse treatment in the past but medical record indicates he was hospitalized at a facility in [place] in 2023 and he was in [facility] in 2023. He has been admitted to [facility] for continuous assessment 4 times  in 2024.  Pt is dressed in hospital scrubs, alert and oriented x4. Pt speaks in a clear tone, at moderate volume and normal pace. Motor behavior appears normal. Eye contact is good. Pt's mood is depressed and affect is congruent with mood. Thought process is coherent and relevant.  There is no indication from Pt's behavior that he is currently responding to internal stimuli or experiencing delusional thought content. He is cooperative and says he needs to be in a facility in order to be safe and not act on suicidal thoughts.  Chief Complaint:  Chief Complaint  Patient presents with   Suicidal   Visit Diagnosis:  F33.2 Major depressive disorder, Recurrent episode, Severe F10.20 Alcohol use disorder, Severe  CCA Screening, Triage and Referral (STR)  Patient Reported Information How did you hear about [place]? Self  What Is the Reason for Your Visit/Call Today? Pt says he feels severely depressed and that he attempted suicide tonight by wrapping a cord around his neck. He reports abusing alcohol several times per week. He says he is experiencing auditory and visual hallucinations.  How Long Has This Been Causing You Problems? 1 wk - 1 month  What Do You Feel Would Help You the Most Today? Alcohol or Drug Use Treatment; Treatment for Depression or other mood problem; Medication(s)   Have You Recently Had Any Thoughts About Hurting Yourself? Yes  Are You Planning to Commit Suicide/Harm Yourself At This time? Yes   Flowsheet Row ED from 01/30/2023 in [facility] Department at [facility] ED from 01/27/2023 in [facility] ED from 01/24/2023 in [facility] Emergency Department at [facility]  C-SSRS RISK CATEGORY High Risk High Risk No Risk       Have you Recently Had Thoughts About Hurting Someone [name]? No  Are You Planning to Harm Someone at This Time? No  Explanation: Pt reports he attempted suicide tonight by trying to stangle himself with a cord. He denies homicidal ideation.   Have You Used Any Alcohol or Drugs in the Past 24 Hours? Yes  What Did You Use and How Much? Pt reports drinking a half gallon of liquor within the past 24 hours.   Do You Currently Have a Therapist/Psychiatrist? No  Name of  Therapist/Psychiatrist: Name of Therapist/Psychiatrist: Pt reports no mental health providers.   Have You Been Recently Discharged From Any Office Practice or Programs? Yes  Explanation of Discharge From Practice/Program: Discharged from [facility] 01/27/2023     CCA Screening Triage Referral Assessment Type of Contact: Tele-Assessment  Telemedicine Service Delivery: Telemedicine service delivery: This service was provided via telemedicine using a 2-way, interactive audio and video technology  Is this Initial or Reassessment? Is this Initial or Reassessment?: Initial Assessment  Date Telepsych consult ordered in [facility]:  Date Telepsych consult ordered in [facility]: 01/31/23  Time Telepsych consult ordered in [facility]:  Time Telepsych consult ordered in [facility]: 0053  Location of Assessment: [facility] ED  Provider Location: [facility] Assessment Services   Collateral Involvement: none reported   Does Patient Have a Automotive engineer Guardian? No  Legal Guardian Contact Information: Pt does not have a legal guardian  Copy of Legal Guardianship Form: -- (Pt does not have a legal guardian)  Legal Guardian Notified of Arrival: -- (Pt does not have a legal guardian)  Legal Guardian Notified of Pending Discharge: -- (Pt does not have a legal guardian)  If Minor and Not Living with Parent(s), Who has Custody? Pt is  an adult  Is CPS involved or ever been involved? Never  Is APS involved or ever been involved? Never   Patient Determined To Be At Risk for Harm To Self or Others Based on Review of Patient Reported Information or Presenting Complaint? Yes, for Self-Harm (Pt reports he attempted suicide tonight by trying to stangle himself with a cord. He denies homicidal ideation.)  Method: Plan with intent and identified person  Availability of Means: In hand or used (Pt reports he attempted suicide tonight by trying to stangle himself with a cord. He denies homicidal ideation.)  Intent: Clearly intends on  inflicting harm that could cause death (Pt reports he attempted suicide tonight by trying to stangle himself with a cord. He denies homicidal ideation.)  Notification Required: No need or identified person  Additional Information for Danger to Others Potential: Previous attempts (Pt reports previous suicide attempts)  Additional Comments for Danger to Others Potential: Pt denies history of violence  Are There Guns or Other Weapons in Your Home? Yes  Types of Guns/Weapons: hand gun  Are These Weapons Safely Secured?                            No  Who Could Verify You Are Able To Have These Secured: no one, patient will not disclose location  Do You Have any Outstanding Charges, Pending Court Dates, Parole/Probation? Pt denies current legal problems  Contacted To Inform of Risk of Harm To Self or Others: Unable to Contact:    Does Patient Present under Involuntary Commitment? No    [place] of Residence: [place]   Patient Currently Receiving the Following Services: Not Receiving Services   Determination of Need: Urgent (48 hours)   Options For Referral: [facility] Urgent Care; Outpatient Therapy; Medication Management; Inpatient Hospitalization     CCA Biopsychosocial Patient Reported Schizophrenia/Schizoaffective Diagnosis in Past: No   Strengths: Pt is motivated for treatment   Mental Health Symptoms Depression:   Change in energy/activity; Difficulty Concentrating; Fatigue; Hopelessness; Worthlessness; Tearfulness; Sleep (too much or little); Irritability   Duration of Depressive symptoms:  Duration of Depressive Symptoms: Greater than two weeks   Mania:   None   Anxiety:    Difficulty concentrating; Fatigue; Irritability; Restlessness; Sleep; Tension; Worrying   Psychosis:   Hallucinations   Duration of Psychotic symptoms:  Duration of Psychotic Symptoms: Less than six months   Trauma:   None   Obsessions:   None   Compulsions:   None   Inattention:    None   Hyperactivity/Impulsivity:   None   Oppositional/Defiant Behaviors:   None   Emotional Irregularity:   Recurrent suicidal behaviors/gestures/threats   Other Mood/Personality Symptoms:   Depression    Mental Status Exam Appearance and self-care  Stature:   Average   Weight:   Average weight   Clothing:   -- (Scrubs)   Grooming:   Normal   Cosmetic use:   None   Posture/gait:   Normal   Motor activity:   Not Remarkable   Sensorium  Attention:   Normal   Concentration:   Normal   Orientation:   X5   Recall/memory:   Normal   Affect and Mood  Affect:   Congruent   Mood:   Depressed   Relating  Eye contact:   Normal   Facial expression:   Responsive   Attitude toward examiner:   Cooperative   Thought and Language  Speech flow:  Normal   Thought content:   Appropriate to Mood and Circumstances   Preoccupation:   None   Hallucinations:   Auditory; Visual   Organization:   Patent examiner of Knowledge:   Fair   Intelligence:   Average   Abstraction:   Normal   Judgement:   Poor   Reality Testing:   Adequate   Insight:   Poor   Decision Making:   Impulsive   Social Functioning  Social Maturity:   Isolates   Social Judgement:   "Street Smart"   Stress  Stressors:   Surveyor, quantity; Housing   Coping Ability:   Overwhelmed; Deficient supports   Skill Deficits:   Decision making; Responsibility   Supports:   Support needed     Religion: Religion/Spirituality Are You A Religious Person?: Yes What is Your Religious Affiliation?: Christian How Might This Affect Treatment?: None.  Leisure/Recreation: Leisure / Recreation Do You Have Hobbies?: No  Exercise/Diet: Exercise/Diet Do You Exercise?: No Have You Gained or Lost A Significant Amount of Weight in the Past Six Months?: No Do You Follow a Special Diet?: No Do You Have Any Trouble Sleeping?: Yes Explanation of  Sleeping Difficulties: 1 hr nightly   CCA Employment/Education Employment/Work Situation: Employment / Work Situation Employment Situation: On disability Why is Patient on Disability: Heart failure. How Long has Patient Been on Disability: Since 62. Patient's Job has Been Impacted by Current Illness: No Has Patient ever Been in the [employer]?: No  Education: Education Is Patient Currently Attending School?: No Last Grade Completed: 12 Did You Attend College?: No Did You Have An Individualized Education Program (IIEP): No Did You Have Any Difficulty At School?: No Patient's Education Has Been Impacted by Current Illness: No   CCA Family/Childhood History Family and Relationship History: Family history Marital status: Divorced Divorced, when?: married for 3 months and then divorced What types of issues is patient dealing with in the relationship?: unsure Additional relationship information: n/a Does patient have children?: No  Childhood History:  Childhood History By whom was/is the patient raised?: Mother Did patient suffer any verbal/emotional/physical/sexual abuse as a child?: No Did patient suffer from severe childhood neglect?: No Has patient ever been sexually abused/assaulted/raped as an adolescent or adult?: No Was the patient ever a victim of a crime or a disaster?: No Witnessed domestic violence?: No Has patient been affected by domestic violence as an adult?: No       CCA Substance Use Alcohol/Drug Use: Alcohol / Drug Use Pain Medications: See MAR Prescriptions: See MAR Over the Counter: See MAR History of alcohol / drug use?: Yes Longest period of sobriety (when/how long): Unsure. Negative Consequences of Use: Personal relationships Withdrawal Symptoms: None Substance #1 Name of Substance 1: Alcohol 1 - Age of First Use: Adolescent 1 - Amount (size/oz): 0.5-1 gallon of liquor 1 - Frequency: 4-5 days per week 1 - Duration: Ongoing 1 - Last Use /  Amount: Tonight 1 - Method of Aquiring: Purchase 1- Route of Use: Oral ingestion                       ASAM's:  Six Dimensions of Multidimensional Assessment  Dimension 1:  Acute Intoxication and/or Withdrawal Potential:   Dimension 1:  Description of individual's past and current experiences of substance use and withdrawal: Pt reports drinking alcohol almost daily  Dimension 2:  Biomedical Conditions and Complications:   Dimension 2:  Description of patient's biomedical conditions and  complications: Pt has the following diagnoses: History of CHF (congestive heart failure), HIV (human immunodeficiency virus infection) (HCC), HSV-2 (herpes simplex virus 2) infection. Pt reports, he's not currently seeing a Cardiologist regularly but on a as needed basis.  Dimension 3:  Emotional, Behavioral, or Cognitive Conditions and Complications:  Dimension 3:  Description of emotional, behavioral, or cognitive conditions and complications: Pt has the following diagnoses: Alcohol use disorder, severe, dependence (HCC), Recurrent major depressive episodes, moderate (HCC), Adjustment disorder with depressed mood.  Dimension 4:  Readiness to Change:  Dimension 4:  Description of Readiness to Change criteria: During the assessment pt did not disclose wanting to stop using substances.  Dimension 5:  Relapse, Continued use, or Continued Problem Potential:  Dimension 5:  Relapse, continued use, or continued problem potential critiera description: Pt has ongoing substance use.  Dimension 6:  Recovery/Living Environment:  Dimension 6:  Recovery/Iiving environment criteria description: Pt is chronically homeless  ASAM Severity Score: ASAM's Severity Rating Score: 12  ASAM Recommended Level of Treatment: ASAM Recommended Level of Treatment: Level III Residential Treatment   Substance use Disorder (SUD) Substance Use Disorder (SUD)  Checklist Symptoms of Substance Use: Continued use despite having a  persistent/recurrent physical/psychological problem caused/exacerbated by use, Continued use despite persistent or recurrent social, interpersonal problems, caused or exacerbated by use, Evidence of tolerance, Substance(s) often taken in larger amounts or over longer times than was intended, Presence of craving or strong urge to use, Persistent desire or unsuccessful efforts to cut down or control use  Recommendations for Services/Supports/Treatments: Recommendations for Services/Supports/Treatments Recommendations For Services/Supports/Treatments: CD-IOP Intensive Chemical Dependency Program, Medication Management  Discharge Disposition: Discharge Disposition Medical Exam completed: Yes  DSM5 Diagnoses: Patient Active Problem List   Diagnosis Date Noted   Dental caries 09/13/2022   Substance abuse 05/03/2022   Polysubstance abuse 04/29/2022   Alcohol use disorder, severe, dependence 03/16/2022   Recurrent major depressive episodes, moderate 02/23/2022   Alcohol abuse    Suicidal ideation    Vitamin B1 deficiency 09/19/2021   Adjustment disorder with depressed mood    History of CHF (congestive heart failure) 08/07/2021   H/O ETOH abuse 08/07/2021   Chronic HFrEF (heart failure with reduced ejection fraction) 07/23/2021   HIV (human immunodeficiency virus infection) 07/23/2021   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   Tinea versicolor 09/17/2018   History of tobacco abuse 09/16/2018   Dilated cardiomyopathy 09/13/2018     Referrals to Alternative Service(s): Referred to Alternative Service(s):   Place:   Date:   Time:    Referred to Alternative Service(s):   Place:   Date:   Time:    Referred to Alternative Service(s):   Place:   Date:   Time:    Referred to Alternative Service(s):   Place:   Date:   Time:     Pamalee Leyden, [facility]

## 2023-01-31 NOTE — ED Notes (Signed)
Pt resting quietly in view of nursing station  breathing even and unlabored.  No distress noted.

## 2023-01-31 NOTE — ED Notes (Signed)
Per TTS consult with Venda Rodes, Roselyn Bering, NP recommends pt be transferred to [facility] for continuous assessment.Currently waiting to receive confirmation of bed availability from [facility] at [facility].

## 2023-01-31 NOTE — ED Notes (Signed)
Dr. Madilyn Hook at the bedside to assess pt

## 2023-01-31 NOTE — ED Notes (Signed)
Pt resting quietly in view of nursing station  breathing even and unlabored.  No distress noted.  Will cont to monitor

## 2023-01-31 NOTE — ED Notes (Addendum)
Pt taken to RM 20 for TTS consult for privacy with Venda Rodes, [facility]

## 2023-01-31 NOTE — ED Provider Notes (Signed)
[place] EMERGENCY DEPARTMENT AT [facility] Provider Note   CSN: [id number] Arrival date & time: 01/30/23  2141     History  Chief Complaint  Patient presents with   Suicidal    William Powers is a 42 y.o. male.  The history is provided by the patient and medical records.   William Powers is a 42 y.o. male who presents to the Emergency Department complaining of suicidal.  He presents to the ED for SI that he states started several weeks ago. He states he tried to cut his neck with a knife but then decided to call 911.      Home Medications Prior to Admission medications   Medication Sig Start Date End Date Taking? Authorizing Provider  Potassium Chloride ER 20 MEQ TBCR Take 20 mEq by mouth daily at 6 (six) AM. Patient not taking: No sig reported 08/07/21 09/05/21  Bobette Mo, MD      Allergies    Bee venom, Zestril [lisinopril], and Bactrim [sulfamethoxazole-trimethoprim]    Review of Systems   Review of Systems  All other systems reviewed and are negative.   Physical Exam Updated Vital Signs BP 116/84   Pulse (!) 103   Temp 98.3 F (36.8 C) (Oral)   Resp 18   Ht  (1.93 m)   Wt 109 kg   SpO2 97%   BMI 29.25 kg/m  Physical Exam Vitals and nursing note reviewed.  Constitutional:      Appearance: He is well-developed.     Comments: Awoken from sleep for examination  HENT:     Head: Normocephalic and atraumatic.  Neck:     Comments: No abrasions or wounds to the neck Cardiovascular:     Rate and Rhythm: Normal rate and regular rhythm.     Heart sounds: No murmur heard. Pulmonary:     Effort: Pulmonary effort is normal. No respiratory distress.     Breath sounds: Normal breath sounds.  Abdominal:     Palpations: Abdomen is soft.     Tenderness: There is no abdominal tenderness. There is no guarding or rebound.  Musculoskeletal:        General: No tenderness.     Cervical back: Neck supple.  Skin:    General: Skin is  warm and dry.  Neurological:     Mental Status: He is oriented to person, place, and time.  Psychiatric:     Comments: Poor eye contact.  Reports SI.  Does not appear to be responding to internal stimuli     ED Results / Procedures / Treatments   Labs (all labs ordered are listed, but only abnormal results are displayed) Labs Reviewed  COMPREHENSIVE METABOLIC PANEL - Abnormal; Notable for the following components:      Result Value   Sodium 134 (*)    Potassium 3.1 (*)    CO2 19 (*)    Calcium 8.6 (*)    Total Protein 9.1 (*)    AST 54 (*)    All other components within normal limits  ETHANOL - Abnormal; Notable for the following components:   Alcohol, Ethyl (B) 178 (*)    All other components within normal limits  SALICYLATE LEVEL - Abnormal; Notable for the following components:   Salicylate Lvl <7.0 (*)    All other components within normal limits  ACETAMINOPHEN LEVEL - Abnormal; Notable for the following components:   Acetaminophen (Tylenol), Serum <10 (*)    All other components within  normal limits  CBC - Abnormal; Notable for the following components:   WBC 3.9 (*)    All other components within normal limits  SARS CORONAVIRUS 2 BY RT PCR  RAPID URINE DRUG SCREEN, HOSP PERFORMED  CBG MONITORING, ED    EKG None  Radiology No results found.  Procedures Procedures    Medications Ordered in ED Medications  potassium chloride SA (KLOR-CON M) CR tablet 40 mEq (40 mEq Oral Given 01/31/23 0344)    ED Course/ Medical Decision Making/ A&P                             Medical Decision Making Amount and/or Complexity of Data Reviewed Labs: ordered.  Risk Prescription drug management.   Patient with history of substance abuse, CHF, HIV here for evaluation of SI with attempt to cut his throat.  He has no external evidence of trauma on examination.  His alcohol level is mildly elevated but he is not clearly intoxicated.  His potassium is mildly low at 3.1-this  is replaced orally.  He is seeking help.  Will not IVC at this time.  TTS was consulted with plan to transfer to be [facility] for ongoing observation.  Patient is agreeable with transfer to be [facility] for ongoing evaluation.  Patient is medically stable for ongoing psychiatric care.        Final Clinical Impression(s) / ED Diagnoses Final diagnoses:  Suicidal ideation    Rx / DC Orders ED Discharge Orders     None         Tilden Fossa, MD 01/31/23 [id number]

## 2023-01-31 NOTE — ED Provider Notes (Signed)
FBC/OBS ASAP Discharge Summary  Date and Time: 01/31/2023 3:23 PM  Name: William Powers  MRN:  [id number]   Discharge Diagnoses:  Final diagnoses:  Suicidal ideation  Alcohol abuse  MDD (major depressive disorder), recurrent episode, moderate  Cocaine use disorder   Subjective:   On reassessment pt reports euthymic mood. He denies suicidal, homicidal or violent ideations. He denies auditory visual hallucinations or paranoia. He asks to be discharged with bus passes so that he can go to [place] to [facility]. When asked about the suicidal remarks he made earlier, he states he made those statements because he was intoxicated and that he did not mean them. He reports he was looking for a place to stay. He tells me that the police "knows me, he even asked which ED do you want to go to". He denies he had tied anything around his neck. He states "look, there are no marks there". He agrees should there be any worsening depression, suicidal ideation or other safety concerns he will call 911/EMS, go to the nearest emergency room or crisis facility for evaluation. He reports he is help-seeking when needed. Of note, he has had 24 ED visits in the past 6 months.  Stay Summary:  Pt is a 42 y/o male w/ history of polysubstance abuse, alcohol use disorder, suicidal ideation presenting to [facility] on 01/31/23 as a direct admit from [facility] where he had initially presented under IVC stating he was trying to cut his neck and hang himself with a shoe string. On reassessment, he denies suicidal, homicidal or violent ideations. He denies auditory visual hallucinations or paranoia.   Total Time spent with patient: 20 minutes  Past Psychiatric History: Polysubstance abuse, alcohol use disorder, suicidal ideation Past Medical History: CHF, HIV, PE, HTN Family History: Mother - HIV, Lupus; Father - HIV Family Psychiatric History: None reported Social History: Divorced, unemployed, homeless, received disability  for CHF Tobacco Cessation:  A prescription for an FDA-approved tobacco cessation medication was offered at discharge and the patient refused  Current Medications:  Current Facility-Administered Medications  Medication Dose Route Frequency Provider Last Rate Last Admin   acetaminophen (TYLENOL) tablet 650 mg  650 mg Oral Q6H PRN Bobbitt, Shalon E, NP       alum & mag hydroxide-simeth (MAALOX/MYLANTA) 200-200-20 MG/5ML suspension 30 mL  30 mL Oral Q4H PRN Bobbitt, Shalon E, NP       hydrOXYzine (ATARAX) tablet 25 mg  25 mg Oral TID PRN Bobbitt, Shalon E, NP       magnesium hydroxide (MILK OF MAGNESIA) suspension 30 mL  30 mL Oral Daily PRN Bobbitt, Shalon E, NP       traZODone (DESYREL) tablet 50 mg  50 mg Oral QHS PRN Bobbitt, Shalon E, NP       No current outpatient medications on file.    PTA Medications:  Facility Ordered Medications  Medication   [COMPLETED] potassium chloride SA (KLOR-CON M) CR tablet 40 mEq   acetaminophen (TYLENOL) tablet 650 mg   alum & mag hydroxide-simeth (MAALOX/MYLANTA) 200-200-20 MG/5ML suspension 30 mL   magnesium hydroxide (MILK OF MAGNESIA) suspension 30 mL   traZODone (DESYREL) tablet 50 mg   hydrOXYzine (ATARAX) tablet 25 mg   [COMPLETED] potassium chloride SA (KLOR-CON M) CR tablet 20 mEq       01/31/2023    7:01 AM 01/27/2023   12:51 AM 05/03/2022   12:30 PM  Depression screen PHQ 2/9  Decreased Interest Down, Depressed,  Hopeless 1 1 2   PHQ - 2 Score 2 2 3   Altered sleeping 1 1 2   Tired, decreased energy 1 1 1   Change in appetite 1  0  Feeling bad or failure about yourself  1 1 2   Trouble concentrating  1 0  Moving slowly or fidgety/restless 1 0 0  Suicidal thoughts 1 1 2   PHQ-9 Score 8 7 10   Difficult doing work/chores Somewhat difficult Somewhat difficult Somewhat difficult    Flowsheet Row ED from 01/31/2023 in [facility] ED from 01/30/2023 in [facility]  ED from 01/27/2023 in [facility]  C-SSRS RISK CATEGORY High Risk High Risk High Risk       Musculoskeletal  Strength & Muscle Tone: within normal limits Gait & Station: normal Patient leans: N/A  Psychiatric Specialty Exam  Presentation  General Appearance:  Appropriate for Environment; Other (comment); Fairly Groomed (dressed in scrubs)  Eye Contact: Good  Speech: Clear and Coherent; Normal Rate  Speech Volume: Normal  Handedness: Right   Mood and Affect  Mood: Euthymic  Affect: Appropriate; Full Range   Thought Process  Thought Processes: Coherent; Goal Directed; Linear  Descriptions of Associations:Intact  Orientation:Full (Time, Place and Person)  Thought Content:Logical  Diagnosis of Schizophrenia or Schizoaffective disorder in past: No    Hallucinations:Hallucinations: None Description of Auditory Hallucinations: None Description of Visual Hallucinations: None  Ideas of Reference:None  Suicidal Thoughts:Suicidal Thoughts: No  Homicidal Thoughts:Homicidal Thoughts: No  Sensorium  Memory: Immediate Good; Recent Good; Remote Good  Judgment: Intact  Insight: Fair   Art therapist  Concentration: Good  Attention Span: Good  Recall: Good  Fund of Knowledge: Good  Language: Good   Psychomotor Activity  Psychomotor Activity: Psychomotor Activity: Normal   Assets  Assets: Communication Skills; Desire for Improvement; Financial Resources/Insurance; Physical Health; Resilience   Sleep  Sleep: Sleep: Fair Number of Hours of Sleep: -1   Nutritional Assessment (For OBS and FBC admissions only) Has the patient had a weight loss or gain of 10 pounds or more in the last 3 months?: No Has the patient had a decrease in food intake/or appetite?: No Does the patient have dental problems?: No Does the patient have eating habits or behaviors that may be indicators of an eating disorder including  binging or inducing vomiting?: No Has the patient recently lost weight without trying?: 0 Has the patient been eating poorly because of a decreased appetite?: 0 Malnutrition Screening Tool Score: 0    Physical Exam  Physical Exam Constitutional:      General: He is not in acute distress.    Appearance: He is not ill-appearing, toxic-appearing or diaphoretic.  Eyes:     General: No scleral icterus. Cardiovascular:     Rate and Rhythm: Normal rate.  Pulmonary:     Effort: Pulmonary effort is normal. No respiratory distress.  Neurological:     Mental Status: He is alert and oriented to person, place, and time.  Psychiatric:        Attention and Perception: Attention and perception normal.        Mood and Affect: Mood and affect normal.        Speech: Speech normal.        Behavior: Behavior normal. Behavior is cooperative.        Thought Content: Thought content normal.        Cognition and Memory: Cognition and memory normal.  Judgment: Judgment normal.    Review of Systems  Constitutional:  Negative for chills and fever.  Respiratory:  Negative for shortness of breath.   Cardiovascular:  Negative for chest pain and palpitations.  Gastrointestinal:  Negative for abdominal pain.  Neurological:  Negative for headaches.  Psychiatric/Behavioral:  Positive for substance abuse.    Blood pressure 116/89, pulse 95, temperature 98.5 F (36.9 C), temperature source Oral, resp. rate 16, SpO2 98 %. There is no height or weight on file to calculate BMI.  Demographic Factors:  Male, Low socioeconomic status, Living alone, and Unemployed  Loss Factors: Financial problems/change in socioeconomic status  Historical Factors: NA  Risk Reduction Factors:   NA  Continued Clinical Symptoms:  Alcohol/Substance Abuse/Dependencies Previous Psychiatric Diagnoses and Treatments  Cognitive Features That Contribute To Risk:  None    Suicide Risk:  Mild:  Suicidal ideation of  limited frequency, intensity, duration, and specificity.  There are no identifiable plans, no associated intent, mild dysphoria and related symptoms, good self-control (both objective and subjective assessment), few other risk factors, and identifiable protective factors, including available and accessible social support.  Plan Of Care/Follow-up recommendations:  Follow up with substance use treatment  Disposition:  Discharge  Lauree Chandler, NP 01/31/2023, 3:23 PM

## 2023-01-31 NOTE — ED Notes (Signed)
Pt A&O x 4, presents with suicidal ideations, plan to shoot self with gun.  Pt ied shoestring around  h  is neck and asked [employer] to shoot him., unable to contract for safety.  Pt reports AVH, denies HI.  Comfort measures given.  Monitoring for safety.

## 2023-01-31 NOTE — ED Notes (Signed)
Pt given warm chicken broth as we are currently out of soup, pt also given additional ginger ale

## 2023-01-31 NOTE — ED Notes (Signed)
TTS completed at this time, pt placed back in Hall-D, waiting on recommendations from Venda Rodes, [facility]

## 2023-01-31 NOTE — ED Notes (Signed)
Pt appears to be sleeping, can observed even RR that are unlabored, blanket on pt for warmth and comfort, pt able to reposition self for comfort NAD noted, pt within view of staff in hall bed for safety monitoring, no further concerns as of present

## 2023-01-31 NOTE — Discharge Instructions (Signed)

## 2023-01-31 NOTE — ED Notes (Signed)
Pt awakens to verbal prompting.  He took po medications without incident.  Flat affect and congruent mood.  Pt alert and oriented.  He has been polite respectful to staff  and asked to use the restroom.  No distress noted.  Will cont to monitor for safety.

## 2023-01-31 NOTE — ED Notes (Signed)
Pt up to use the bathroom. No complaints or issues noted.

## 2023-02-01 ENCOUNTER — Emergency Department (HOSPITAL_COMMUNITY)
Admission: EM | Admit: 2023-02-01 | Discharge: 2023-02-01 | Discharge status: Against Medical Advice | Payer: Medicaid Other | Attending: Emergency Medicine | Admitting: Emergency Medicine

## 2023-02-01 ENCOUNTER — Other Ambulatory Visit: Payer: Self-pay

## 2023-02-01 ENCOUNTER — Emergency Department (HOSPITAL_COMMUNITY): Payer: Medicaid Other

## 2023-02-01 ENCOUNTER — Encounter (HOSPITAL_COMMUNITY): Payer: Self-pay

## 2023-02-01 DIAGNOSIS — Z21 Asymptomatic human immunodeficiency virus [HIV] infection status: Secondary | ICD-10-CM | POA: Diagnosis not present

## 2023-02-01 DIAGNOSIS — I110 Hypertensive heart disease with heart failure: Secondary | ICD-10-CM | POA: Insufficient documentation

## 2023-02-01 DIAGNOSIS — R079 Chest pain, unspecified: Secondary | ICD-10-CM

## 2023-02-01 DIAGNOSIS — I509 Heart failure, unspecified: Secondary | ICD-10-CM | POA: Diagnosis not present

## 2023-02-01 LAB — CBC
HCT: 41.4 % (ref 39.0–52.0)
Hemoglobin: 13.7 g/dL (ref 13.0–17.0)
MCH: 30.6 pg (ref 26.0–34.0)
MCHC: 33.1 g/dL (ref 30.0–36.0)
MCV: 92.4 fL (ref 80.0–100.0)
Platelets: 293 10*3/uL (ref 150–400)
RBC: 4.48 MIL/uL (ref 4.22–5.81)
RDW: 13.8 % (ref 11.5–15.5)
WBC: 3 10*3/uL — ABNORMAL LOW (ref 4.0–10.5)
nRBC: 0 % (ref 0.0–0.2)

## 2023-02-01 LAB — BASIC METABOLIC PANEL
Anion gap: 12 (ref 5–15)
BUN: 11 mg/dL (ref 6–20)
CO2: 22 mmol/L (ref 22–32)
Calcium: 8.5 mg/dL — ABNORMAL LOW (ref 8.9–10.3)
Chloride: 104 mmol/L (ref 98–111)
Creatinine, Ser: 0.97 mg/dL (ref 0.61–1.24)
GFR, Estimated: >60 mL/min (ref >60)
Glucose, Bld: 97 mg/dL (ref 70–99)
Potassium: 3.4 mmol/L — ABNORMAL LOW (ref 3.5–5.1)
Sodium: 138 mmol/L (ref 135–145)

## 2023-02-01 LAB — TROPONIN I (HIGH SENSITIVITY)
Troponin I (High Sensitivity): 6 ng/L (ref <18)
Troponin I (High Sensitivity): 6 ng/L (ref <18)

## 2023-02-01 LAB — D-DIMER, QUANTITATIVE: D-Dimer, Quant: <0.27 ug/mL-FEU (ref 0.00–0.50)

## 2023-02-01 MED ORDER — ALUM & MAG HYDROXIDE-SIMETH 200-200-20 MG/5ML PO SUSP
30.0000 mL | Freq: Once | ORAL | Status: AC
  Administered 2023-02-01: 30 mL via ORAL
  Filled 2023-02-01: qty 30

## 2023-02-01 NOTE — ED Provider Notes (Signed)
[facility] EMERGENCY DEPARTMENT AT [facility] Provider Note   CSN: [id number] Arrival date & time: 02/01/23  4098     History  Chief Complaint  Patient presents with   Chest Pain    William Powers is a 42 y.o. male history includes CHF, PE not on anticoagulants, HIV, polysubstance abuse, hypertension.  Patient presented via EMS today for evaluation of right-sided chest pain that began last night possibly around midnight.  Pain is sharp and aching he reports occasionally worsens with position or deep breath but not occurring at this time.  Pain does not radiate.  He denies similar in the past.  Pain is mild at this time he has not tried any medications.  He denies fever, chills, cough/hemoptysis, extremity swelling/color change, abdominal pain, nausea, vomiting or any additional concerns.  Of note patient is sleeping comfortably on my initial evaluation.  He is arousable to voice.  HPI     Home Medications Prior to Admission medications   Medication Sig Start Date End Date Taking? Authorizing Provider  Potassium Chloride ER 20 MEQ TBCR Take 20 mEq by mouth daily at 6 (six) AM. Patient not taking: No sig reported 08/07/21 09/05/21  Bobette Mo, MD      Allergies    Bee venom, Zestril [lisinopril], and Bactrim [sulfamethoxazole-trimethoprim]    Review of Systems   Review of Systems Ten systems are reviewed and are negative for acute change except as noted in the HPI  Physical Exam Updated Vital Signs BP 115/77   Pulse 89   Temp 97.6 F (36.4 C) (Oral)   Resp 18   SpO2 96%  Physical Exam Constitutional:      General: He is not in acute distress.    Appearance: Normal appearance. He is well-developed. He is not ill-appearing or diaphoretic.  HENT:     Head: Normocephalic and atraumatic.  Eyes:     General: Vision grossly intact. Gaze aligned appropriately.     Pupils: Pupils are equal, round, and reactive to light.  Neck:     Trachea: Trachea  and phonation normal.  Cardiovascular:     Rate and Rhythm: Normal rate and regular rhythm.     Heart sounds: Normal heart sounds.  Pulmonary:     Effort: Pulmonary effort is normal. No respiratory distress.     Breath sounds: Normal breath sounds.  Abdominal:     General: There is no distension.     Palpations: Abdomen is soft.     Tenderness: There is no abdominal tenderness. There is no guarding or rebound.  Musculoskeletal:        General: Normal range of motion.     Cervical back: Normal range of motion.     Right lower leg: No tenderness. No edema.     Left lower leg: No tenderness. No edema.  Skin:    General: Skin is warm and dry.  Neurological:     Mental Status: He is alert.     GCS: GCS eye subscore is 4. GCS verbal subscore is 5. GCS motor subscore is 6.     Comments: Speech is clear and goal oriented, follows commands Major Cranial nerves without deficit, no facial droop Moves extremities without ataxia, coordination intact  Psychiatric:        Behavior: Behavior normal.     ED Results / Procedures / Treatments   Labs (all labs ordered are listed, but only abnormal results are displayed) Labs Reviewed  BASIC METABOLIC PANEL -  Abnormal; Notable for the following components:      Result Value   Potassium 3.4 (*)    Calcium 8.5 (*)    All other components within normal limits  CBC - Abnormal; Notable for the following components:   WBC 3.0 (*)    All other components within normal limits  D-DIMER, QUANTITATIVE  RAPID URINE DRUG SCREEN, HOSP PERFORMED  TROPONIN I (HIGH SENSITIVITY)  TROPONIN I (HIGH SENSITIVITY)    EKG EKG Interpretation  Date/Time:  Sunday February 01 2023 06:13:47 EDT Ventricular Rate:  91 PR Interval:  171 QRS Duration: 100 QT Interval:  372 QTC Calculation: 458 R Axis:   63 Text Interpretation: Sinus rhythm Probable left atrial enlargement RSR' in V1 or V2, probably normal variant ST elev, probable normal early repol pattern  Confirmed by Alvester Chou [phone]) on 02/01/2023 6:59:12 AM  Radiology DG Chest 2 View  Result Date: 02/01/2023 CLINICAL DATA:  Chest pain, shortness of breath and cough. EXAM: CHEST - 2 VIEW COMPARISON:  01/24/2023 FINDINGS: The heart size and mediastinal contours appear normal. Lung volumes are low. No pleural fluid or airspace disease. Osseous structures appear intact. IMPRESSION: Low lung volumes. No acute findings. Electronically Signed   By: Signa Kell M.D.   On: 02/01/2023 05:42    Procedures Procedures    Medications Ordered in ED Medications  alum & mag hydroxide-simeth (MAALOX/MYLANTA) 200-200-20 MG/5ML suspension 30 mL (30 mLs Oral Given 02/01/23 0735)    ED Course/ Medical Decision Making/ A&P Clinical Course as of 02/01/23 1011  Sun Feb 01, 2023  6045 History of PE, right-sided chest pain [BM]    Clinical Course User Index [BM] Bill Salinas, PA-C                             Medical Decision Making 42 year old male history as above presented for right-sided chest pain that began around midnight last night without clear inciting event.  He denies any fall or injury, no fever no chills.  No associate abdominal pain nausea or vomiting.  On my initial evaluation patient is sleeping comfortably in bed and in no acute distress, he is arousable to voice and reports continued right-sided chest pain.  Cardiac workup was started in triage and so far is reassuring.  He does have a history of pulmonary embolism and is not anticoagulated I have added on a D-dimer however my suspicion is low for PE at this time.  He has no tachycardia, hypoxia or tachypnea.  He has no evidence for DVT on exam.  No pleurisy at this time.  Report in triage the patient had been using some alcohol, possible gastritis, will attempt to give patient a GI cocktail to see if this provides any relief.  Differential includes but not limited to ACS, PE, dissection, PNA, PTX, gastritis.  Amount and/or  Complexity of Data Reviewed Labs: ordered.    Details: Initial and delta troponin within normal limits, 6 -> 6 D-dimer negative, lower suspicion for PE at this time CBC shows mild leukopenia at 3.0.  No anemia or thrombocytopenia. BMP shows mild hypokalemia at 3.4 and mild hypocalcemia at 8.5.  No emergent electrolyte derangement, AKI or gap. UDS was never collected Radiology:     Details: I have personally reviewed and interpreted patient's two-view chest x-ray.  I do not appreciate any obvious PTX, PNA or effusion. ECG/medicine tests:     Details: I have personally reviewed and interpreted patient's  twelve-lead EKG.  Shows sinus rhythm with rate of 91.  I do not appreciate any obvious acute ischemic changes.  Risk OTC drugs.   So far workup was reassuring.  I reassessed the patient he was sleeping comfortably bed no acute distress reports symptoms improved after GI cocktail.  Plan was to monitor and reassess after discussion with attending physician.  Unfortunately patient eloped from the emergency department.  Apparently he spoke with the nurse and left AGAINST MEDICAL ADVICE.  I was not made aware that the patient was leaving the department before he was gone.  Note: Portions of this report may have been transcribed using voice recognition software. Every effort was made to ensure accuracy; however, inadvertent computerized transcription errors may still be present.         Final Clinical Impression(s) / ED Diagnoses Final diagnoses:  Nonspecific chest pain    Rx / DC Orders ED Discharge Orders     None         Elizabeth Palau 02/01/23 1012    Terald Sleeper, MD 02/01/23 1749

## 2023-02-01 NOTE — ED Triage Notes (Signed)
Pt arrived via [facility] c/o chest pain with deep breaths 10/10 on pain scale, a sharp deep pain.

## 2023-02-01 NOTE — ED Provider Triage Note (Signed)
  Emergency Medicine Provider Triage Evaluation Note  MRN:  [id number]  Arrival date & time: 02/01/23    Medically screening exam initiated at 4:48 AM.   CC:   Chest Pain   HPI:  William Powers is a 42 y.o. year-old male presents to the ED with chief complaint of chest pain that started at 8pm.  Occasional SOB, but not now.  Recent cough.  Denies fever.  Reports ETOH use.  History provided by patient. ROS:  -As included in HPI PE:   Vitals:   02/01/23 0440 02/01/23 0442  BP: 108/76   Pulse: 97   Resp: 15   Temp: 98 F (36.7 C)   SpO2: 95% 95%    Non-toxic appearing No respiratory distress  MDM:  Based on signs and symptoms, ACS is highest on my differential, followed by URI or other. I've ordered labs in triage to expedite lab/diagnostic workup.  No STEMI on EKG.  Patient appears stable to wait in the lobby for next room.  Patient was informed that the remainder of the evaluation will be completed by another provider, this initial triage assessment does not replace that evaluation, and the importance of remaining in the ED until their evaluation is complete.    Roxy Horseman, PA-C 02/01/23 [phone]

## 2023-02-04 ENCOUNTER — Other Ambulatory Visit: Payer: Self-pay

## 2023-02-04 ENCOUNTER — Emergency Department (HOSPITAL_COMMUNITY)
Admission: EM | Admit: 2023-02-04 | Discharge: 2023-02-04 | Disposition: A | Discharge status: Home / Self Care | Payer: Medicaid Other | Attending: Emergency Medicine | Admitting: Emergency Medicine

## 2023-02-04 ENCOUNTER — Encounter (HOSPITAL_COMMUNITY): Payer: Self-pay

## 2023-02-04 DIAGNOSIS — Z008 Encounter for other general examination: Secondary | ICD-10-CM | POA: Diagnosis present

## 2023-02-04 DIAGNOSIS — Z5321 Procedure and treatment not carried out due to patient leaving prior to being seen by health care provider: Secondary | ICD-10-CM | POA: Diagnosis not present

## 2023-02-04 DIAGNOSIS — R45851 Suicidal ideations: Secondary | ICD-10-CM | POA: Insufficient documentation

## 2023-02-04 NOTE — ED Triage Notes (Signed)
Suicidal ideation with no specific plan.

## 2023-02-04 NOTE — ED Provider Notes (Signed)
[place] EMERGENCY DEPARTMENT AT [facility] Provider Note   CSN: [id number] Arrival date & time: 02/04/23  0321    History  Chief Complaint  Patient presents with   Suicidal    William Powers is a 42 y.o. male history of recurrent SI, alcohol abuse, MDD, cocaine use, HIV, CHF for evaluation SI without plan.  He will not expound on this aside from stating this has been going on for a few weeks. Denies recent attempt at self harm. No chest pain, shortness of breath, vomiting, recent trauma or injuries.  She did drink alcohol yesterday.  Denies any illicit substances.  HPI     Home Medications Prior to Admission medications   Medication Sig Start Date End Date Taking? Authorizing Provider  Potassium Chloride ER 20 MEQ TBCR Take 20 mEq by mouth daily at 6 (six) AM. Patient not taking: No sig reported 08/07/21 09/05/21  Bobette Mo, MD      Allergies    Bee venom, Zestril [lisinopril], and Bactrim [sulfamethoxazole-trimethoprim]    Review of Systems   Review of Systems  Constitutional: Negative.   HENT: Negative.    Respiratory: Negative.    Cardiovascular: Negative.   Gastrointestinal: Negative.   Genitourinary: Negative.   Musculoskeletal: Negative.   Skin: Negative.   Neurological: Negative.   Psychiatric/Behavioral:  Positive for suicidal ideas. Negative for agitation, behavioral problems, confusion, dysphoric mood, hallucinations, self-injury and sleep disturbance. The patient is not nervous/anxious and is not hyperactive.   All other systems reviewed and are negative.   Physical Exam Updated Vital Signs BP (!) 144/104 (BP Location: Right Arm)   Pulse 97   Temp 98.2 F (36.8 C) (Oral)   Resp 16   SpO2 98%  Physical Exam Vitals and nursing note reviewed.  Constitutional:      General: He is not in acute distress.    Appearance: He is well-developed. He is not ill-appearing, toxic-appearing or diaphoretic.  HENT:     Head: Atraumatic.   Eyes:     Pupils: Pupils are equal, round, and reactive to light.  Cardiovascular:     Rate and Rhythm: Normal rate and regular rhythm.  Pulmonary:     Effort: Pulmonary effort is normal. No respiratory distress.  Abdominal:     General: There is no distension.     Palpations: Abdomen is soft.  Musculoskeletal:        General: Normal range of motion.     Cervical back: Normal range of motion and neck supple.  Skin:    General: Skin is warm and dry.  Neurological:     General: No focal deficit present.     Mental Status: He is alert and oriented to person, place, and time.  Psychiatric:        Attention and Perception: Attention normal.        Mood and Affect: Mood and affect normal.        Speech: Speech normal.        Behavior: Behavior normal. Behavior is cooperative.        Thought Content: Thought content is not paranoid or delusional. Thought content includes suicidal ideation. Thought content does not include homicidal ideation. Thought content does not include homicidal or suicidal plan.     Comments: SI without plan. Denies HI, AVH     ED Results / Procedures / Treatments   Labs (all labs ordered are listed, but only abnormal results are displayed) Labs Reviewed  CBC WITH DIFFERENTIAL/PLATELET  COMPREHENSIVE METABOLIC PANEL  ETHANOL  SALICYLATE LEVEL  ACETAMINOPHEN LEVEL    EKG None  Radiology No results found.  Procedures Procedures    Medications Ordered in ED Medications - No data to display  ED Course/ Medical Decision Making/ A&P    42 year old history of recurrent SI, MDD, alcohol abuse, cocaine use disorder here for evaluation of SI.  No plan.  No recent attempts.  Was seen for similar 3 days ago.  Patient states this is been a longstanding issue.  He denies any recent stressors.  States typically worsens after he is using illicit substances.  He denies any nausea or vomiting.  No pain.  No trauma.  Will plan on psych clearance labs and talk to  psychiatry.  Nursing informed me that patient refused labs.  I went to discuss with patient and he states he "just wants a place to sleep."  And to "leave him alone."  Patient awoke after being the emergency department for 7 hours.  He states he feels improved and he is only suicidal when he was intoxicated.  He denies any current SI, HI, AVH with myself as well as staff in room.  He is ambulatory currently.  Requesting food.  Again denies any SI, HI, AVH. I offered resources for this however he declined.  Patient found walking in the emergency department doors, given he has no current suicidal ideation will not IVC at this time.  Encourage him to follow-up with [facility] or return if his symptoms return.  The patient has been appropriately medically screened and/or stabilized in the ED. I have low suspicion for any other emergent medical condition which would require further screening, evaluation or treatment in the ED or require inpatient management.  Patient is hemodynamically stable and in no acute distress.  Patient able to ambulate in department prior to ED.  Evaluation does not show acute pathology that would require ongoing or additional emergent interventions while in the emergency department or further inpatient treatment.  I have discussed the diagnosis with the patient and answered all questions.  Pain is been managed while in the emergency department and patient has no further complaints prior to discharge.  Patient is comfortable with plan discussed in room and is stable for discharge at this time.  I have discussed strict return precautions for returning to the emergency department.  Patient was encouraged to follow-up with PCP/specialist refer to at discharge.                             Medical Decision Making Amount and/or Complexity of Data Reviewed External Data Reviewed: labs, radiology and notes. Labs: ordered. Decision-making details documented in ED Course.  Risk OTC  drugs. Prescription drug management. Decision regarding hospitalization. Diagnosis or treatment significantly limited by social determinants of health.           Final Clinical Impression(s) / ED Diagnoses Final diagnoses:  Suicidal thoughts    Rx / DC Orders ED Discharge Orders     None         Keamber Macfadden A, PA-C 02/04/23 1457    Linwood Dibbles, MD 02/04/23 1528

## 2023-02-09 ENCOUNTER — Other Ambulatory Visit: Payer: Self-pay

## 2023-02-09 ENCOUNTER — Emergency Department (HOSPITAL_COMMUNITY)
Admission: EM | Admit: 2023-02-09 | Discharge: 2023-02-10 | Disposition: A | Discharge status: Home / Self Care | Payer: No Typology Code available for payment source | Attending: Emergency Medicine | Admitting: Emergency Medicine

## 2023-02-09 ENCOUNTER — Encounter (HOSPITAL_COMMUNITY): Payer: Self-pay | Admitting: *Deleted

## 2023-02-09 DIAGNOSIS — F1020 Alcohol dependence, uncomplicated: Secondary | ICD-10-CM | POA: Diagnosis present

## 2023-02-09 DIAGNOSIS — Z21 Asymptomatic human immunodeficiency virus [HIV] infection status: Secondary | ICD-10-CM | POA: Insufficient documentation

## 2023-02-09 DIAGNOSIS — I110 Hypertensive heart disease with heart failure: Secondary | ICD-10-CM | POA: Diagnosis not present

## 2023-02-09 DIAGNOSIS — I5020 Unspecified systolic (congestive) heart failure: Secondary | ICD-10-CM | POA: Diagnosis not present

## 2023-02-09 DIAGNOSIS — F17210 Nicotine dependence, cigarettes, uncomplicated: Secondary | ICD-10-CM | POA: Diagnosis not present

## 2023-02-09 DIAGNOSIS — Y904 Blood alcohol level of 80-99 mg/100 ml: Secondary | ICD-10-CM | POA: Insufficient documentation

## 2023-02-09 DIAGNOSIS — R45851 Suicidal ideations: Secondary | ICD-10-CM

## 2023-02-09 NOTE — ED Triage Notes (Signed)
PT arrived with EMS for SI, reportedly been clean off heroin for 4 days; last used 2 hours ago. Pt very lethargic, will wake up to answer questions with stimulation, reports "heroin makes you want to kill yourself" no specific SI plan. EMS VS CBG 96 bp 118/76 pulse 106. Pt restless, has attempted to fall out of wheelchair on multiple times, placed in recliner, then nearly fell out of recliner. Pt placed on stretcher in triage area for safety

## 2023-02-10 DIAGNOSIS — F1020 Alcohol dependence, uncomplicated: Secondary | ICD-10-CM

## 2023-02-10 LAB — COMPREHENSIVE METABOLIC PANEL
ALT: 33 U/L (ref 0–44)
AST: 34 U/L (ref 15–41)
Albumin: 3.4 g/dL — ABNORMAL LOW (ref 3.5–5.0)
Alkaline Phosphatase: 44 U/L (ref 38–126)
Anion gap: 12 (ref 5–15)
BUN: 11 mg/dL (ref 6–20)
CO2: 21 mmol/L — ABNORMAL LOW (ref 22–32)
Calcium: 8.3 mg/dL — ABNORMAL LOW (ref 8.9–10.3)
Chloride: 104 mmol/L (ref 98–111)
Creatinine, Ser: 0.96 mg/dL (ref 0.61–1.24)
GFR, Estimated: >60 mL/min (ref >60)
Glucose, Bld: 94 mg/dL (ref 70–99)
Potassium: 4 mmol/L (ref 3.5–5.1)
Sodium: 137 mmol/L (ref 135–145)
Total Bilirubin: 0.9 mg/dL (ref 0.3–1.2)
Total Protein: 7.8 g/dL (ref 6.5–8.1)

## 2023-02-10 LAB — RAPID URINE DRUG SCREEN, HOSP PERFORMED
Amphetamines: NOT DETECTED
Barbiturates: NOT DETECTED
Benzodiazepines: NOT DETECTED
Cocaine: NOT DETECTED
Opiates: NOT DETECTED
Tetrahydrocannabinol: NOT DETECTED

## 2023-02-10 LAB — CBC WITH DIFFERENTIAL/PLATELET
Abs Immature Granulocytes: 0.01 10*3/uL (ref 0.00–0.07)
Basophils Absolute: 0 10*3/uL (ref 0.0–0.1)
Basophils Relative: 1 %
Eosinophils Absolute: 0.2 10*3/uL (ref 0.0–0.5)
Eosinophils Relative: 7 %
HCT: 41.5 % (ref 39.0–52.0)
Hemoglobin: 13.7 g/dL (ref 13.0–17.0)
Immature Granulocytes: 0 %
Lymphocytes Relative: 37 %
Lymphs Abs: 1.2 10*3/uL (ref 0.7–4.0)
MCH: 31.2 pg (ref 26.0–34.0)
MCHC: 33 g/dL (ref 30.0–36.0)
MCV: 94.5 fL (ref 80.0–100.0)
Monocytes Absolute: 0.4 10*3/uL (ref 0.1–1.0)
Monocytes Relative: 12 %
Neutro Abs: 1.4 10*3/uL — ABNORMAL LOW (ref 1.7–7.7)
Neutrophils Relative %: 43 %
Platelets: 266 10*3/uL (ref 150–400)
RBC: 4.39 MIL/uL (ref 4.22–5.81)
RDW: 13.9 % (ref 11.5–15.5)
WBC: 3.2 10*3/uL — ABNORMAL LOW (ref 4.0–10.5)
nRBC: 0 % (ref 0.0–0.2)

## 2023-02-10 LAB — ETHANOL: Alcohol, Ethyl (B): 89 mg/dL — ABNORMAL HIGH (ref <10)

## 2023-02-10 NOTE — ED Provider Notes (Signed)
[place] EMERGENCY DEPARTMENT AT [facility] Provider Note   CSN: [id number] Arrival date & time: 02/09/23  2301     History  Chief Complaint  Patient presents with   Drug Overdose    William Powers is a 42 y.o. male.  43 year old male with prior medical history as detailed below presents for evaluation.  Patient presented overnight.  Patient currently is endorsing suicidal ideation without specific plan.  Patient endorses gradually increasing symptoms of depression over the last several weeks.  Patient reports prior suicide attempt in the past with "having ingested bleach".  Patient denies recent suicide attempt.  Patient is otherwise without complaint.  Of note, at time of my evaluation at 7 AM the patient has been in the ED for approximately 8 hours waiting for evaluation.  From chart review it appears that he was intoxicated on arrival.  His current complaints are consistent with prior presentations.  Also of note, current weather outside includes an ambient temperature of approximately 35-40 degrees.  The history is provided by the patient and medical records.       Home Medications Prior to Admission medications   Medication Sig Start Date End Date Taking? Authorizing Provider  Potassium Chloride ER 20 MEQ TBCR Take 20 mEq by mouth daily at 6 (six) AM. Patient not taking: No sig reported 08/07/21 09/05/21  Bobette Mo, MD      Allergies    Bee venom, Zestril [lisinopril], and Bactrim [sulfamethoxazole-trimethoprim]    Review of Systems   Review of Systems  All other systems reviewed and are negative.   Physical Exam Updated Vital Signs BP 107/68 (BP Location: Left Arm)   Pulse (!) 107   Temp 98.7 F (37.1 C) (Oral)   Resp 18   SpO2 98%  Physical Exam Vitals and nursing note reviewed.  Constitutional:      General: He is not in acute distress.    Appearance: Normal appearance. He is well-developed.  HENT:     Head: Normocephalic  and atraumatic.  Eyes:     Conjunctiva/sclera: Conjunctivae normal.     Pupils: Pupils are equal, round, and reactive to light.  Cardiovascular:     Rate and Rhythm: Normal rate and regular rhythm.     Heart sounds: Normal heart sounds.  Pulmonary:     Effort: Pulmonary effort is normal. No respiratory distress.     Breath sounds: Normal breath sounds.  Abdominal:     General: There is no distension.     Palpations: Abdomen is soft.     Tenderness: There is no abdominal tenderness.  Musculoskeletal:        General: No deformity. Normal range of motion.     Cervical back: Normal range of motion and neck supple.  Skin:    General: Skin is warm and dry.  Neurological:     General: No focal deficit present.     Mental Status: He is alert and oriented to person, place, and time.     ED Results / Procedures / Treatments   Labs (all labs ordered are listed, but only abnormal results are displayed) Labs Reviewed  COMPREHENSIVE METABOLIC PANEL - Abnormal; Notable for the following components:      Result Value   CO2 21 (*)    Calcium 8.3 (*)    Albumin 3.4 (*)    All other components within normal limits  ETHANOL - Abnormal; Notable for the following components:   Alcohol, Ethyl (B) 89 (*)  All other components within normal limits  CBC WITH DIFFERENTIAL/PLATELET - Abnormal; Notable for the following components:   WBC 3.2 (*)    Neutro Abs 1.4 (*)    All other components within normal limits  RAPID URINE DRUG SCREEN, HOSP PERFORMED    EKG EKG Interpretation  Date/Time:  Tuesday February 10 2023 03:38:07 EDT Ventricular Rate:  89 PR Interval:  167 QRS Duration: 100 QT Interval:  367 QTC Calculation: 447 R Axis:   66 Text Interpretation: Sinus rhythm Confirmed by Kristine Royal [phone]) on 02/10/2023 7:07:09 AM  Radiology No results found.  Procedures Procedures    Medications Ordered in ED Medications - No data to display  ED Course/ Medical Decision Making/  A&P                             Medical Decision Making   Medical Screen Complete  This patient presented to the ED with complaint of suicidal ideation.  This complaint involves an extensive number of treatment options. The initial differential diagnosis includes, but is not limited to, suicidal ideation  This presentation is: Chronic, Self-Limited, Previously Undiagnosed, Uncertain Prognosis, Complicated, Systemic Symptoms, and Threat to Life/Bodily Function  Patient presents with complaint of active suicidal ideation.  He denies any specific plan to harm self.  He does not feel safe for discharge at time of evaluation.  Patient is requesting psychiatric evaluation.  Patient medically clear for psychiatric evaluation.    Final disposition dependent on psych evaluation and plan of treatment.  Additional history obtained:  External records from outside sources obtained and reviewed including prior ED visits and prior Inpatient records.    Lab Tests:  I ordered and personally interpreted labs.  The pertinent results include: CBC, CMP, EtOH, urine drug screen    Problem List / ED Course:  Suicidal ideation   Reevaluation:  After the interventions noted above, I reevaluated the patient and found that they have: stayed the same  Disposition:  After consideration of the diagnostic results and the patients response to treatment, I feel that the patent would benefit from psychiatric evaluation.          Final Clinical Impression(s) / ED Diagnoses Final diagnoses:  Suicidal ideation    Rx / DC Orders ED Discharge Orders     None         Wynetta Fines, MD 02/10/23 [phone]

## 2023-02-10 NOTE — ED Notes (Signed)
Patient changed into scrubs and has been wanded by security.  Pt calm and cooperative.

## 2023-02-10 NOTE — Progress Notes (Signed)
This Clinical research associate was contacted by Pysch NP for SA and Outpatient MH resources. This writer attached information to pt's AVS. No further TOC needs at this time. TOC signing off.

## 2023-02-10 NOTE — Discharge Summary (Signed)
William Powers Psych ED Discharge  02/10/2023 9:52 AM Naser Schuld  MRN:  [id number]  Principal Problem: Alcohol use disorder, severe, dependence Discharge Diagnoses: Principal Problem:   Alcohol use disorder, severe, dependence  Clinical Impression:  Final diagnoses:  Suicidal ideation   Subjective: Per Triage note;  Patient arrived via EMS for suicide ideation. Patient reported off Heroin for 4 days but had some 2 hours before coming to the ER.  Patient was lethargic, only wakes up to answer question and back to sleep again.Patient was restless on arrival attempted to fall off W/C, nearly fell off recliner.  UDS is negative for any drugs.  Alcohol level 89. Records reviewed, patient interviewed.  Patient vehemently denied ever used Heroin in his life.  Patient upset that somebody wrote down he used Heroin.  Patient states he has issues with Alcohol and drinks every other day.  Patient states he want outpatient Alcohol treatment. Patient frequents to ER with c/o suicidal thought or ideation but no plan.  This morning he denied ever attempted suicide.  He denies SI/HI/AVH at this time.  He wants a referral for alcohol treatment.  Patient reports his stressor for drinking too much and feeling suicidal is ' life in General"  Patient is unemployed, is on disability for Heart failure.  He cannot work and he shares apartment with somebody. AA Male, well groomed and neat came in under the influence of Alcohol and want treatment for alcoholism.  Patient reports that he is tired of drinking and want outpatient referral   for alcohol treatment.  He denies feeling suicidal at this time.  He denies drug use as well.  Patient is Psychiatrically for discharge and we will offer information for  outpatient alcohol treatment.  ED Assessment Time Calculation: Start Time: 0911 Stop Time: 0932 Total Time in Minutes (Assessment Completion): 21   Past Psychiatric History: Alcohol use disorder, Polysubstance abuse in  Remission, Adjustment disorder with depressed, MDD.  Multiple ER Visits for suicide ideation with no attempts.  Past Medical History:  Past Medical History:  Diagnosis Date   CHF (congestive heart failure)    Heart failure    HFrEF (heart failure with reduced ejection fraction)    15% EF as of 2019   HIV (human immunodeficiency virus infection)    Hypertension    Polysubstance abuse    Pulmonary embolism    Single subsegmental pulmonary embolism without acute cor pulmonale 07/23/2021    Past Surgical History:  Procedure Laterality Date   HERNIA REPAIR     Family History:  Family History  Problem Relation Age of Onset   Lupus Mother    HIV Mother    HIV Father    Family Psychiatric  History: denies Social History:  Social History   Substance and Sexual Activity  Alcohol Use Yes   Comment: 10 pints per week     Social History   Substance and Sexual Activity  Drug Use Yes   Types: Marijuana, Cocaine    Social History   Socioeconomic History   Marital status: Divorced    Spouse name: Not on file   Number of children: Not on file   Years of education: Not on file   Highest education level: Not on file  Occupational History   Not on file  Tobacco Use   Smoking status: Some Days    Packs/day: .5    Types: Cigarettes, Cigars   Smokeless tobacco: Never  Vaping Use   Vaping Use: Never used  Substance and Sexual Activity   Alcohol use: Yes    Comment: 10 pints per week   Drug use: Yes    Types: Marijuana, Cocaine   Sexual activity: Not on file  Other Topics Concern   Not on file  Social History Narrative   Not on file   Social Determinants of Health   Financial Resource Strain: Not on file  Food Insecurity: Not on file  Transportation Needs: Not on file  Physical Activity: Not on file  Stress: Not on file  Social Connections: Not on file    Tobacco Cessation:  N/A, patient does not currently use tobacco products  Current Medications: No current  facility-administered medications for this encounter.   No current outpatient medications on file.   PTA Medications: (Not in a hospital admission)   [place] Scale:  Flowsheet Row ED from 02/09/2023 in [facility] Emergency Department at [facility] ED from 02/04/2023 in [facility] Emergency Department at [facility] ED from 02/01/2023 in [facility] Emergency Department at [facility]  C-SSRS RISK CATEGORY High Risk High Risk Error: Q3, 4, or 5 should not be populated when Q2 is No       Musculoskeletal: Strength & Muscle Tone: within normal limits Gait & Station: normal Patient leans: Front  Psychiatric Specialty Exam: Presentation  General Appearance:  Casual; Neat  Eye Contact: Good  Speech: Clear and Coherent; Normal Rate  Speech Volume: Normal  Handedness: Right   Mood and Affect  Mood: Angry  Affect: Congruent   Thought Process  Thought Processes: Coherent; Goal Directed; Linear  Descriptions of Associations:Intact  Orientation:Full (Time, Place and Person)  Thought Content:Logical  History of Schizophrenia/Schizoaffective disorder:No  Duration of Psychotic Symptoms:N/A  Hallucinations:Hallucinations: None  Ideas of Reference:None  Suicidal Thoughts:Suicidal Thoughts: No  Homicidal Thoughts:Homicidal Thoughts: No   Sensorium  Memory: Immediate Good; Recent Good; Remote Good  Judgment: Good  Insight: Good   Executive Functions  Concentration: Good  Attention Span: Good  Recall: Good  Fund of Knowledge: Good  Language: Good   Psychomotor Activity  Psychomotor Activity: Psychomotor Activity: Normal   Assets  Assets: Communication Skills; Housing; Desire for Improvement; Social Support   Sleep  Sleep: Sleep: Good    Physical Exam: Physical Exam Vitals and nursing note reviewed.  HENT:     Head: Normocephalic and atraumatic.     Nose: Nose normal.  Cardiovascular:      Rate and Rhythm: Normal rate and regular rhythm.     Pulses: Normal pulses.  Pulmonary:     Effort: Pulmonary effort is normal.  Musculoskeletal:        General: Normal range of motion.     Cervical back: Normal range of motion.  Skin:    General: Skin is warm and dry.  Neurological:     General: No focal deficit present.     Mental Status: He is alert and oriented to person, place, and time.  Psychiatric:        Attention and Perception: Attention and perception normal.        Mood and Affect: Mood and affect normal.        Speech: Speech normal.        Behavior: Behavior normal. Behavior is cooperative.        Thought Content: Thought content normal.        Cognition and Memory: Cognition and memory normal.        Judgment: Judgment normal.    Review  of Systems  Constitutional: Negative.   HENT: Negative.    Eyes: Negative.   Respiratory: Negative.    Cardiovascular: Negative.   Gastrointestinal: Negative.   Genitourinary: Negative.   Musculoskeletal: Negative.   Skin: Negative.   Neurological: Negative.   Endo/Heme/Allergies: Negative.   Psychiatric/Behavioral:         Alcohol dependence   Blood pressure (!) 103/56, pulse 89, temperature 98.3 F (36.8 C), temperature source Oral, resp. rate 18, SpO2 99 %. There is no height or weight on file to calculate BMI.   Demographic Factors:  Male, Adolescent or young adult, Low socioeconomic status, and Unemployed  Loss Factors: Decline in physical health  Historical Factors: NA  Risk Reduction Factors:   Living with another person, especially a relative and receives disability check  Continued Clinical Symptoms:  Alcohol/Substance Abuse/Dependencies More than one psychiatric diagnosis  Cognitive Features That Contribute To Risk:  None    Suicide Risk:  Minimal: No identifiable suicidal ideation.  Patients presenting with no risk factors but with morbid ruminations; may be classified as minimal risk based on the  severity of the depressive symptoms    Plan Of Care/Follow-up recommendations:  R egular  Medical Decision Making: Patient is alert and awake and oriented x5.  He admits he was under the influence of Alcohol when he came in.  He denies SI/HI/AVH at this time.  He want to stop using Alcohol and is asking for outpatient referral for alcohol treatment.  Patient is Psychiatrically cleared for discharge.   Disposition: Psychiatrically cleared.  Earney Navy, NP-PMHNP-BC 02/10/2023, 9:52 AM

## 2023-02-10 NOTE — Discharge Instructions (Signed)
Return for any problem.  ?

## 2023-02-13 ENCOUNTER — Ambulatory Visit (HOSPITAL_COMMUNITY)
Admission: EM | Admit: 2023-02-13 | Discharge: 2023-02-13 | Disposition: A | Discharge status: Home / Self Care | Payer: No Typology Code available for payment source | Attending: Psychiatry | Admitting: Psychiatry

## 2023-02-13 DIAGNOSIS — I509 Heart failure, unspecified: Secondary | ICD-10-CM | POA: Insufficient documentation

## 2023-02-13 DIAGNOSIS — F1010 Alcohol abuse, uncomplicated: Secondary | ICD-10-CM | POA: Insufficient documentation

## 2023-02-13 DIAGNOSIS — R45851 Suicidal ideations: Secondary | ICD-10-CM | POA: Insufficient documentation

## 2023-02-13 DIAGNOSIS — Z765 Malingerer [conscious simulation]: Secondary | ICD-10-CM | POA: Insufficient documentation

## 2023-02-13 NOTE — Discharge Instructions (Signed)
F/u with outpatient alcohol treatment facility [facility] with [facility]

## 2023-02-13 NOTE — ED Provider Notes (Signed)
Behavioral Health Urgent Care Medical Screening Exam  Patient Name: William Powers MRN: [id number] Date of Evaluation: 02/13/23 Chief Complaint:   Diagnosis:  Final diagnoses:  Malingering  Alcohol abuse  Suicidal thoughts    History of Present illness: William Powers is a 42 y.o. male. With long history of ER visits patient was last seen and evaluated by psych on 02/10/23, patient also has a history of alcohol abuse suicidal ideation polysubstance abuse.  Patient presented to [facility] voluntarily according to patient I feel like how about harm myself because I have a lot of problems going on and dealing with my aunt and I am dealing with a lot of health issues.  According to patient he stays with his aunt for the most part but times he will just get a hotel room and stay there.  According to patient he is disabled and is currently on disability for heart failure.  According to patient he was at the bar all night tonight drinking from 6:00 pm and he took a Uber to get here.  Review of patient records does show similar presentation.  It does appear that patient is malingering or probably needing somewhere to stay,  during the interview pt stated he live at his aunt house. Pt story keep changing,  at first he stated he was stress because is aunt was dead. Then as the interview progresses patient story change and is aunt was not dead anymore.   Face-to-face observation of patient, patient is alert and oriented x 4, speech is clear.  Maintained minimal eye contact.  Patient is well groomed and well-dressed and according to him he was at a bar from 6 PM last night.  Patient stated he has suicidal thoughts to hurt himself, patient also stated he has thought to hurt someone else but he does not know where they are at.  When asked if he has access to guns he stated he has a pistol.  When TTS spoke with him he stated he didn't have the access to the pistol.  Pt story keep change the more you talk with  him.  Patient denies HI, AVH or paranoia.  According to patient he is not seeing a psychiatrist but is looking for 1 and is not seeing a therapist but he is looking for 1.  Review of patient records show patient was given multiple resources however patient stated that they are too far for him.  Patient does not seem to be influenced by external or internal stimuli at this time.  Patient does appear to be malingering and more of a secondary gain.  When asked if he can return to this aunts home pt stated his aunt live in [place].   Pt was given a Part Pass  Recommend discharge the patient to follow-up with outpatient referral for alcohol treatment.  Flowsheet Row ED from 02/13/2023 in [facility] ED from 02/09/2023 in [facility] Emergency Department at [facility] ED from 02/04/2023 in [facility] Emergency Department at [facility]  C-SSRS RISK CATEGORY High Risk High Risk High Risk       Psychiatric Specialty Exam  Presentation  General Appearance:Casual  Eye Contact:Good  Speech:Clear and Coherent  Speech Volume:Decreased  Handedness:Right   Mood and Affect  Mood: Anxious  Affect: Appropriate   Thought Process  Thought Processes: Coherent  Descriptions of Associations:Circumstantial  Orientation:Full (Time, Place and Person)  Thought Content:WDL  Diagnosis of Schizophrenia or Schizoaffective disorder in past:  No   Hallucinations:None None None  Ideas of Reference:None  Suicidal Thoughts:Yes, Passive Without Plan With Intent; With Plan  Homicidal Thoughts:No Without Intent Without Intent; Without Plan   Sensorium  Memory: Immediate Fair  Judgment: Fair  Insight: Fair   Chartered certified accountant: Good  Attention Span: Good  Recall: Good  Fund of Knowledge: Good  Language: Good   Psychomotor Activity  Psychomotor Activity: Normal   Assets  Assets: Desire for  Improvement   Sleep  Sleep: Fair  Number of hours:  -1   Physical Exam: Physical Exam HENT:     Head: Normocephalic.     Nose: Nose normal.  Cardiovascular:     Rate and Rhythm: Normal rate.  Pulmonary:     Effort: Pulmonary effort is normal.  Musculoskeletal:        General: Normal range of motion.     Cervical back: Normal range of motion.  Neurological:     General: No focal deficit present.     Mental Status: He is alert.  Psychiatric:        Mood and Affect: Mood normal.        Behavior: Behavior normal.        Thought Content: Thought content normal.        Judgment: Judgment normal.    Review of Systems  Constitutional: Negative.   HENT: Negative.    Eyes: Negative.   Respiratory: Negative.    Cardiovascular: Negative.   Gastrointestinal: Negative.   Genitourinary: Negative.   Musculoskeletal: Negative.   Skin: Negative.   Neurological: Negative.   Psychiatric/Behavioral:  Positive for substance abuse. The patient is nervous/anxious.    Blood pressure 127/89, pulse 96, temperature 98.3 F (36.8 C), temperature source Oral, resp. rate 19, SpO2 100 %. There is no height or weight on file to calculate BMI.  Musculoskeletal: Strength & Muscle Tone: within normal limits Gait & Station: normal Patient leans: N/A   [facility] MSE Discharge Disposition for Follow up and Recommendations: Based on my evaluation the patient does not appear to have an emergency medical condition and can be discharged with resources and follow up care in outpatient services for Substance Abuse Intensive Outpatient Program   [name], NP 02/13/2023, 4:09 AM

## 2023-02-13 NOTE — Progress Notes (Signed)
   02/13/23 0405  [facility] Triage Screening (Walk-ins at [facility] only)  How Did You Hear About [place]? Self  What Is the Reason for Your Visit/Call Today? William Powers is a 42 year old male presenting to [name] with chief complaint of SI and thoughts of hurting other people. Patient reports he took a UBER here from the bar this morning due to having SI. Patient denies having a plan to kill himself however reports that he has access to his pistol. Patient also reports having thoughts about harming "a nigga I don't like, and he don't like me" stating that the man is jealous of his relationship with a women.  Patient does not share who this person is and he does not know where this person is located and denies recent contact with this person. Patient denies active plan or intent to harm said person however is reporting thoughts to harm himself. Patient reports having a lot of issues such as death in his family, financial issues and reports "I'm just tired of the bullshit". Pt reports that he drinks daily and reports that he had a half pint of liquor today around 630pm. Patient states that he does not want to be alone tonight in fear that he will harm himself. Patient reports living with his aunt and when he is not living with his aunt he is staying in hotel rooms. Pt denies being homeless. Pt reports aunt lives in [place]. Patient reports that he is here visiting and reports "all I need is a ride back to [facility]" TTS offered pt PART passes and patient reports he does not have anywhere to go tonight.  Pt reports he does have a gun but does not have access to it here.  How Long Has This Been Causing You Problems? 1 wk - 1 month  Have You Recently Had Any Thoughts About Hurting Yourself? Yes  How long ago did you have thoughts about hurting yourself? week  Are You Planning to Commit Suicide/Harm Yourself At This time? No  Have you Recently Had Thoughts About Hurting Someone William Powers? Yes  How long ago did you have thoughts of  harming others? for awhile  Are You Planning To Harm Someone At This Time? No  Explanation: na  Are you currently experiencing any auditory, visual or other hallucinations? No  Please explain the hallucinations you are currently experiencing: na  Have You Used Any Alcohol or Drugs in the Past 24 Hours? Yes  How long ago did you use Drugs or Alcohol? 630  What Did You Use and How Much? drunk pint of alcohol  Do you have any current medical co-morbidities that require immediate attention? No  Clinician description of patient physical appearance/behavior: calm  What Do You Feel Would Help You the Most Today? Treatment for Depression or other mood problem;Housing Assistance;Transportation Assistance  If access to [facility]gent [facility] was not available, would you have sought care in the Emergency Department? No  Determination of Need Urgent (48 hours)  Options For Referral Medication Management;Outpatient Therapy;Facility-Based Crisis

## 2023-02-13 NOTE — BH Assessment (Signed)
Pt came back into facility after being discharged and reports that he called for an ambulance. Pt stated that they should be here in about 15 min. Pt states "I don't want to lay out in that cold tonight".

## 2023-02-15 ENCOUNTER — Emergency Department (HOSPITAL_COMMUNITY)
Admission: EM | Admit: 2023-02-15 | Discharge: 2023-02-15 | Disposition: A | Discharge status: Home / Self Care | Payer: Medicaid Other | Attending: Emergency Medicine | Admitting: Emergency Medicine

## 2023-02-15 ENCOUNTER — Emergency Department (HOSPITAL_COMMUNITY): Payer: Medicaid Other

## 2023-02-15 ENCOUNTER — Other Ambulatory Visit: Payer: Self-pay

## 2023-02-15 ENCOUNTER — Encounter (HOSPITAL_COMMUNITY): Payer: Self-pay

## 2023-02-15 DIAGNOSIS — F17210 Nicotine dependence, cigarettes, uncomplicated: Secondary | ICD-10-CM | POA: Insufficient documentation

## 2023-02-15 DIAGNOSIS — R079 Chest pain, unspecified: Secondary | ICD-10-CM | POA: Diagnosis present

## 2023-02-15 DIAGNOSIS — Z21 Asymptomatic human immunodeficiency virus [HIV] infection status: Secondary | ICD-10-CM | POA: Insufficient documentation

## 2023-02-15 DIAGNOSIS — Y906 Blood alcohol level of 120-199 mg/100 ml: Secondary | ICD-10-CM | POA: Diagnosis not present

## 2023-02-15 DIAGNOSIS — R0602 Shortness of breath: Secondary | ICD-10-CM | POA: Diagnosis not present

## 2023-02-15 DIAGNOSIS — R45851 Suicidal ideations: Secondary | ICD-10-CM | POA: Diagnosis not present

## 2023-02-15 DIAGNOSIS — I509 Heart failure, unspecified: Secondary | ICD-10-CM | POA: Insufficient documentation

## 2023-02-15 DIAGNOSIS — I110 Hypertensive heart disease with heart failure: Secondary | ICD-10-CM | POA: Diagnosis not present

## 2023-02-15 LAB — BASIC METABOLIC PANEL
Anion gap: 12 (ref 5–15)
BUN: 13 mg/dL (ref 6–20)
CO2: 20 mmol/L — ABNORMAL LOW (ref 22–32)
Calcium: 8.5 mg/dL — ABNORMAL LOW (ref 8.9–10.3)
Chloride: 103 mmol/L (ref 98–111)
Creatinine, Ser: 1.01 mg/dL (ref 0.61–1.24)
GFR, Estimated: >60 mL/min (ref >60)
Glucose, Bld: 99 mg/dL (ref 70–99)
Potassium: 3.6 mmol/L (ref 3.5–5.1)
Sodium: 135 mmol/L (ref 135–145)

## 2023-02-15 LAB — CBC
HCT: 40.2 % (ref 39.0–52.0)
Hemoglobin: 13.9 g/dL (ref 13.0–17.0)
MCH: 31.7 pg (ref 26.0–34.0)
MCHC: 34.6 g/dL (ref 30.0–36.0)
MCV: 91.8 fL (ref 80.0–100.0)
Platelets: 279 10*3/uL (ref 150–400)
RBC: 4.38 MIL/uL (ref 4.22–5.81)
RDW: 13.7 % (ref 11.5–15.5)
WBC: 4 10*3/uL (ref 4.0–10.5)
nRBC: 0 % (ref 0.0–0.2)

## 2023-02-15 LAB — BRAIN NATRIURETIC PEPTIDE: B Natriuretic Peptide: 19.8 pg/mL (ref 0.0–100.0)

## 2023-02-15 LAB — TROPONIN I (HIGH SENSITIVITY)
Troponin I (High Sensitivity): 7 ng/L (ref <18)
Troponin I (High Sensitivity): 5 ng/L (ref <18)

## 2023-02-15 LAB — ETHANOL: Alcohol, Ethyl (B): 191 mg/dL — ABNORMAL HIGH (ref <10)

## 2023-02-15 NOTE — ED Provider Notes (Signed)
[facility]EM[facility] DEPT [facility] Emergency Department Provider Note MRN:  [id number]  Arrival date & time: 02/15/23     Chief Complaint   Chest Pain and Suicidal   History of Present Illness   William Powers is a 42 y.o. year-old male with a history of HIV, CHF presenting to the ED with chief complaint of suicidal.  Report of chest pain and shortness of breath earlier.  Also suicidal, going to shoot himself with his gun.  Review of Systems  A thorough review of systems was obtained and all systems are negative except as noted in the HPI and PMH.   Patient's Health History    Past Medical History:  Diagnosis Date   CHF (congestive heart failure) (HCC)    Heart failure (HCC)    HFrEF (heart failure with reduced ejection fraction) (HCC)    15% EF as of 2019   HIV (human immunodeficiency virus infection) (HCC)    Hypertension    Polysubstance abuse (HCC)    Pulmonary embolism (HCC)    Single subsegmental pulmonary embolism without acute cor pulmonale (HCC) 07/23/2021    Past Surgical History:  Procedure Laterality Date   HERNIA REPAIR      Family History  Problem Relation Age of Onset   Lupus Mother    HIV Mother    HIV Father     Social History   Socioeconomic History   Marital status: Divorced    Spouse name: Not on file   Number of children: Not on file   Years of education: Not on file   Highest education level: Not on file  Occupational History   Not on file  Tobacco Use   Smoking status: Some Days    Packs/day: .5    Types: Cigarettes, Cigars   Smokeless tobacco: Never  Vaping Use   Vaping Use: Never used  Substance and Sexual Activity   Alcohol use: Yes    Comment: 10 pints per week   Drug use: Yes    Types: Marijuana, Cocaine   Sexual activity: Not on file  Other Topics Concern   Not on file  Social History Narrative   Not on file   Social Determinants of Health   Financial Resource Strain: Not on file  Food Insecurity: Not on file   Transportation Needs: Not on file  Physical Activity: Not on file  Stress: Not on file  Social Connections: Not on file  Intimate Partner Violence: Not on file     Physical Exam   Vitals:   02/15/23 0051  BP: 106/83  Pulse: (!) 101  Resp: 18  Temp: 98 F (36.7 C)  SpO2: 100%    CONSTITUTIONAL: Well-appearing, NAD NEURO/PSYCH: Sleeping comfortably, nonparticipatory on exam but does wake EYES:  eyes equal and reactive ENT/NECK:  no LAD, no JVD CARDIO: Regular rate, well-perfused, normal S1 and S2 PULM:  CTAB no wheezing or rhonchi GI/GU:  non-distended, non-tender MSK/SPINE:  No gross deformities, no edema SKIN:  no rash, atraumatic   *Additional and/or pertinent findings included in MDM below  Diagnostic and Interventional Summary    EKG Interpretation  Date/Time:  February 15, 2023 at 00:58:51 Ventricular Rate:   105 PR Interval:   158 QRS Duration:  90 QT Interval: 348   QTC Calculation:459   R Axis:     Text Interpretation: Sinus rhythm       Labs Reviewed  BASIC METABOLIC PANEL - Abnormal; Notable for the following components:      Result  Value   CO2 20 (*)    Calcium 8.5 (*)    All other components within normal limits  ETHANOL - Abnormal; Notable for the following components:   Alcohol, Ethyl (B) 191 (*)    All other components within normal limits  CBC  BRAIN NATRIURETIC PEPTIDE  RAPID URINE DRUG SCREEN, HOSP PERFORMED  TROPONIN I (HIGH SENSITIVITY)  TROPONIN I (HIGH SENSITIVITY)    DG Chest 2 View  Final Result      Medications - No data to display   Procedures  /  Critical Care Procedures  ED Course and Medical Decision Making  Initial Impression and Ddx Frequent ED visitor with very similar presentation, threatening to shoot self.  Usually makes his threat during alcohol intoxication which is the case this evening.  Recently cleared for outpatient psych by behavioral health 2 days ago.  Past medical/surgical history that increases  complexity of ED encounter: CHF  Interpretation of Diagnostics I personally reviewed the EKG and my interpretation is as follows: Sinus rhythm  Labs reassuring with no significant blood count or electrolyte disturbance, troponin negative x 2  Patient Reassessment and Ultimate Disposition/Management     Overall suspect patient's presentations are related to malingering, housing issues.  Still seems intoxicated, will reassess.  I see no real benefit to repeat TTS evaluation at this time.  Patient is agreeable with discharge so long as he can sleep for 3 more minutes.  Suspect continued ED visits for housing.  Appropriate for discharge.  Patient management required discussion with the following services or consulting groups:  None  Complexity of Problems Addressed Acute illness or injury that poses threat of life of bodily function  Additional Data Reviewed and Analyzed Further history obtained from: Recent Consult notes and Prior labs/imaging results  Additional Factors Impacting ED Encounter Risk Consideration of hospitalization  Elmer Sow. Pilar Plate, MD [facility] Emergency Medicine [facility] [email or web address]  Final Clinical Impressions(s) / ED Diagnoses     ICD-10-CM   1. Suicidal ideation  R45.851     2. Chest pain, unspecified type  R07.9       ED Discharge Orders     None        Discharge Instructions Discussed with and Provided to Patient:     Discharge Instructions      You were evaluated in the Emergency Department and after careful evaluation, we did not find any emergent condition requiring admission or further testing in the hospital.  Your exam/testing today is overall reassuring.  Follow-up with behavioral health.  Please return to the Emergency Department if you experience any worsening of your condition.   Thank you for allowing [place] to be a part of your care.       Sabas Sous, MD 02/15/23 [phone]

## 2023-02-15 NOTE — Discharge Instructions (Signed)
You were evaluated in the Emergency Department and after careful evaluation, we did not find any emergent condition requiring admission or further testing in the hospital.  Your exam/testing today is overall reassuring.  Follow-up with behavioral health.  Please return to the Emergency Department if you experience any worsening of your condition.   Thank you for allowing [place] to be a part of your care.

## 2023-02-15 NOTE — ED Provider Notes (Signed)
MSE was initiated and I personally evaluated the patient and placed orders (if any) at  12:59 AM on February 15, 2023.  Complains of shortness of breath. Reports that he has a history of CHF. Also complaining of feeling suicidal, plan to shoot himself.  Physical Exam Constitutional:      Appearance: He is well-developed.  Cardiovascular:     Rate and Rhythm: Regular rhythm. Tachycardia present.  Pulmonary:     Effort: Pulmonary effort is normal.  Abdominal:     Palpations: Abdomen is soft.  Skin:    General: Skin is warm.  Neurological:     General: No focal deficit present.     Mental Status: He is alert.  Psychiatric:        Thought Content: Thought content includes suicidal ideation.    Labwork for CHF exacerbation, psych screening labs ordered. CXR pending.  The patient appears stable so that the remainder of the MSE may be completed by another provider.   Gilda Crease, MD 02/15/23 [phone]

## 2023-02-15 NOTE — ED Triage Notes (Signed)
Pt arrived via [facility]  c/o chest pain 7/10 with sob. Pt states taht he is also suicidal and has a plan with his pistol

## 2023-02-20 ENCOUNTER — Encounter (HOSPITAL_COMMUNITY): Payer: Self-pay

## 2023-02-20 ENCOUNTER — Encounter (HOSPITAL_COMMUNITY): Payer: Self-pay | Admitting: Emergency Medicine

## 2023-02-20 ENCOUNTER — Emergency Department (HOSPITAL_COMMUNITY)
Admission: EM | Admit: 2023-02-20 | Discharge: 2023-02-20 | Disposition: A | Discharge status: Home / Self Care | Payer: Medicaid Other | Attending: Emergency Medicine | Admitting: Emergency Medicine

## 2023-02-20 ENCOUNTER — Other Ambulatory Visit: Payer: Self-pay

## 2023-02-20 ENCOUNTER — Emergency Department (HOSPITAL_COMMUNITY): Payer: Medicaid Other

## 2023-02-20 ENCOUNTER — Emergency Department (HOSPITAL_COMMUNITY)
Admission: EM | Admit: 2023-02-20 | Discharge: 2023-02-20 | Disposition: A | Discharge status: Home / Self Care | Payer: Medicaid Other | Source: Home / Self Care | Attending: Emergency Medicine | Admitting: Emergency Medicine

## 2023-02-20 DIAGNOSIS — R079 Chest pain, unspecified: Secondary | ICD-10-CM | POA: Diagnosis not present

## 2023-02-20 DIAGNOSIS — K0889 Other specified disorders of teeth and supporting structures: Secondary | ICD-10-CM | POA: Insufficient documentation

## 2023-02-20 DIAGNOSIS — Z5321 Procedure and treatment not carried out due to patient leaving prior to being seen by health care provider: Secondary | ICD-10-CM | POA: Insufficient documentation

## 2023-02-20 DIAGNOSIS — R1110 Vomiting, unspecified: Secondary | ICD-10-CM | POA: Insufficient documentation

## 2023-02-20 LAB — BASIC METABOLIC PANEL
Anion gap: 11 (ref 5–15)
BUN: 14 mg/dL (ref 6–20)
CO2: 20 mmol/L — ABNORMAL LOW (ref 22–32)
Calcium: 8.8 mg/dL — ABNORMAL LOW (ref 8.9–10.3)
Chloride: 106 mmol/L (ref 98–111)
Creatinine, Ser: 1.04 mg/dL (ref 0.61–1.24)
GFR, Estimated: >60 mL/min (ref >60)
Glucose, Bld: 94 mg/dL (ref 70–99)
Potassium: 3.4 mmol/L — ABNORMAL LOW (ref 3.5–5.1)
Sodium: 137 mmol/L (ref 135–145)

## 2023-02-20 LAB — CBC
HCT: 40.3 % (ref 39.0–52.0)
Hemoglobin: 13.5 g/dL (ref 13.0–17.0)
MCH: 31.3 pg (ref 26.0–34.0)
MCHC: 33.5 g/dL (ref 30.0–36.0)
MCV: 93.3 fL (ref 80.0–100.0)
Platelets: 290 10*3/uL (ref 150–400)
RBC: 4.32 MIL/uL (ref 4.22–5.81)
RDW: 13.6 % (ref 11.5–15.5)
WBC: 5.2 10*3/uL (ref 4.0–10.5)
nRBC: 0 % (ref 0.0–0.2)

## 2023-02-20 LAB — TROPONIN I (HIGH SENSITIVITY): Troponin I (High Sensitivity): 7 ng/L (ref <18)

## 2023-02-20 MED ORDER — AMOXICILLIN 500 MG PO CAPS
500.0000 mg | ORAL_CAPSULE | Freq: Once | ORAL | Status: AC
  Administered 2023-02-20: 500 mg via ORAL
  Filled 2023-02-20: qty 1

## 2023-02-20 MED ORDER — NAPROXEN 250 MG PO TABS
500.0000 mg | ORAL_TABLET | Freq: Once | ORAL | Status: AC
  Administered 2023-02-20: 500 mg via ORAL
  Filled 2023-02-20: qty 2

## 2023-02-20 MED ORDER — POTASSIUM CHLORIDE CRYS ER 20 MEQ PO TBCR
40.0000 meq | EXTENDED_RELEASE_TABLET | Freq: Once | ORAL | Status: DC
  Filled 2023-02-20: qty 2

## 2023-02-20 MED ORDER — AMOXICILLIN 500 MG PO CAPS: 500.0000 mg | ORAL_CAPSULE | Freq: Three times a day (TID) | ORAL | 0 refills | Status: DC

## 2023-02-20 NOTE — ED Provider Triage Note (Signed)
Emergency Medicine Provider Triage Evaluation Note  William Powers , a 42 y.o. male  was evaluated in triage.  Pt complains of dental pain and chest discomfort per EMS.  Was agitated for not receiving pain medications per EMS.  He is awake, alert, no acute distress.  Review of Systems  Positive: Dental pain and chest pain Negative: Breath  Physical Exam  BP (!) 138/121 (BP Location: Right Arm)   Pulse (!) 103   Temp 98.2 F (36.8 C) (Oral)   Resp (!) 22   Ht 1.93 m (6\' 4" )   Wt 109 kg   SpO2 97%   BMI 29.25 kg/m  Gen:   Awake, no distress   Resp:  Normal effort  MSK:   Moves extremities without difficulty  Other:  N/A  Medical Decision Making  Medically screening exam initiated at 12:49 AM.  Appropriate orders placed.  William Powers was informed that the remainder of the evaluation will be completed by another provider, this initial triage assessment does not replace that evaluation, and the importance of remaining in the ED until their evaluation is complete.     William Baton, MD 02/20/23 [phone]

## 2023-02-20 NOTE — ED Notes (Signed)
Pt refused to take K pills and states that he was not taken care of during his stay, pt states that he wanted to speak to a manager because he wants a park bus to get him all around the city and not a city bus pass that we provide. Pt states that he is going to his PCP instead where they will help him, pt left out of room and refused discharged vitals. Pt then came back to room demanding a bus pass and communicating threats to this RN to "not come outside or else"

## 2023-02-20 NOTE — ED Notes (Signed)
Pt refused to be seen in hallway, states that he wants to be seen in room. Pt sent back to the lobby to wait for room

## 2023-02-20 NOTE — ED Triage Notes (Signed)
Patient BIB [facility] from home with complaint of right upper tooth pain & chest pain.   Reports CP x 2 days, vomiting x 2, pain bilateral chest that radiates around to left shoulder. Denies SOB.  EMS reports patient aggressive & demanding pain medication towards them during transport to ER.

## 2023-02-20 NOTE — ED Provider Notes (Signed)
[facility]EM[facility]PT [facility] Emergency Department Provider Note MRN:  [id number]  Arrival date & time: 02/20/23     Chief Complaint   Dental Injury   History of Present Illness   William Powers is a 42 y.o. year-old male presents to the ED with chief complaint of dental pain.  Has broken lower rear molars that started bothering him today.  He states that he is concerned he is going to get an infected tooth.  He also says he had some chest pain earlier.    Patient is high ED utilizer.  30 visits in the past 6 months.  History provided by patient.   Review of Systems  Pertinent positive and negative review of systems noted in HPI.    Physical Exam  There were no vitals filed for this visit.  CONSTITUTIONAL:  non toxic-appearing, NAD NEURO:  Alert and oriented x 3, CN 3-12 grossly intact EYES:  eyes equal and reactive ENT/NECK:  Supple, no stridor, poor dentition, cracked rear lower molars bilaterally, no abscess visible CARDIO:  normal rate, appears well-perfused  PULM:  No respiratory distress,  GI/GU:  non-distended,  MSK/SPINE:  No gross deformities, no edema, moves all extremities  SKIN:  no rash, atraumatic   *Additional and/or pertinent findings included in MDM below  Diagnostic and Interventional Summary    EKG Interpretation  Date/Time:    Ventricular Rate:    PR Interval:    QRS Duration:   QT Interval:    QTC Calculation:   R Axis:     Text Interpretation:         Labs Reviewed - No data to display  No orders to display    Medications  potassium chloride SA (KLOR-CON M) CR tablet 40 mEq (40 mEq Oral Patient Refused/Not Given 02/20/23 0607)  amoxicillin (AMOXIL) capsule 500 mg (500 mg Oral Given 02/20/23 0601)     Procedures  /  Critical Care Procedures  ED Course and Medical Decision Making  I have reviewed the triage vital signs, the nursing notes, and pertinent available records from the EMR.  Social Determinants Affecting  Complexity of Care: Patient has decreased access to medical care.   ED Course:    Medical Decision Making Patient with dentalgia.  No abscess requiring immediate incision and drainage.  Exam not concerning for Ludwig's angina or pharyngeal abscess.  Will treat with amox. Pt instructed to follow-up with dentist.  Discussed return precautions. Pt safe for discharge.   Amount and/or Complexity of Data Reviewed Labs: ordered.    Details: K 3.4, given supplemental potassium Trop negative  Risk Prescription drug management.     Consultants: No consultations were needed in caring for this patient.   Treatment and Plan: Emergency department workup does not suggest an emergent condition requiring admission or immediate intervention beyond  what has been performed at this time. The patient is safe for discharge and has  been instructed to return immediately for worsening symptoms, change in  symptoms or any other concerns    Final Clinical Impressions(s) / ED Diagnoses     ICD-10-CM   1. Pain, dental  K08.89       ED Discharge Orders          Ordered    amoxicillin (AMOXIL) 500 MG capsule  3 times daily        02/20/23 0602              Discharge Instructions Discussed with and Provided to Patient:  Discharge Instructions      Your heart marker looked good.  You potassium was a little low.  Try and increase your dietary potassium.  Take the antibiotics as directed.       Roxy Horseman, PA-C 02/20/23 1610    Tilden Fossa, MD 02/20/23 2112

## 2023-02-20 NOTE — ED Triage Notes (Signed)
Tooth pain 2 days

## 2023-02-20 NOTE — Discharge Instructions (Addendum)
Your heart marker looked good.  You potassium was a little low.  Try and increase your dietary potassium.  Take the antibiotics as directed.

## 2023-02-20 NOTE — ED Notes (Signed)
No answer in lobby when called for treatment room 

## 2023-02-21 ENCOUNTER — Other Ambulatory Visit: Payer: Self-pay

## 2023-02-21 ENCOUNTER — Emergency Department (HOSPITAL_COMMUNITY)
Admission: EM | Admit: 2023-02-21 | Discharge: 2023-02-21 | Disposition: A | Discharge status: Home / Self Care | Payer: Medicaid Other | Attending: Emergency Medicine | Admitting: Emergency Medicine

## 2023-02-21 ENCOUNTER — Encounter (HOSPITAL_COMMUNITY): Payer: Self-pay | Admitting: Emergency Medicine

## 2023-02-21 DIAGNOSIS — R079 Chest pain, unspecified: Secondary | ICD-10-CM | POA: Insufficient documentation

## 2023-02-21 DIAGNOSIS — K0889 Other specified disorders of teeth and supporting structures: Secondary | ICD-10-CM | POA: Insufficient documentation

## 2023-02-21 MED ORDER — AMOXICILLIN 500 MG PO CAPS
500.0000 mg | ORAL_CAPSULE | Freq: Once | ORAL | Status: AC
  Administered 2023-02-21: 500 mg via ORAL
  Filled 2023-02-21: qty 1

## 2023-02-21 MED ORDER — AMOXICILLIN 500 MG PO CAPS: 500.0000 mg | ORAL_CAPSULE | Freq: Three times a day (TID) | ORAL | 0 refills | Status: DC

## 2023-02-21 NOTE — ED Triage Notes (Signed)
Pt in with dental pain to R mouth worsened through the night. Also reporting L chest pain. Feels like the "pain is pulsating from his head to his chest".

## 2023-02-21 NOTE — ED Provider Notes (Signed)
[facility]-EMERGENCY DEPT [facility] Emergency Department Provider Note MRN:  [id number]  Arrival date & time: 02/21/23     Chief Complaint   Dental Pain and Chest Pain   History of Present Illness   William Powers is a 42 y.o. year-old male presents to the ED with chief complaint of dental pain.  Seen by me last night for the same.  Complains of the same pain to rear lower molars bilaterally.  States that he was unable to get the antibiotic filled and requests that it be sent to a different pharmacy.   Additionally, patient told triage that he had chest pain, but complains only of dental pain for me.  History provided by patient.   Review of Systems  Pertinent positive and negative review of systems noted in HPI.    Physical Exam   Vitals:   02/21/23 0445 02/21/23 0446  BP: (!) 136/90   Pulse: (!) 105   Resp: 18   Temp:  98.1 F (36.7 C)  SpO2: 97%     CONSTITUTIONAL:  non toxic-appearing, NAD NEURO:  Alert and oriented x 3, CN 3-12 grossly intact EYES:  eyes equal and reactive ENT/NECK:  Supple, no stridor, unchanged rear broken molars, no abscess  CARDIO:  tachycardic, regular rhythm, appears well-perfused  PULM:  No respiratory distress,  GI/GU:  non-distended,  MSK/SPINE:  No gross deformities, no edema, moves all extremities  SKIN:  no rash, atraumatic   *Additional and/or pertinent findings included in MDM below  Diagnostic and Interventional Summary    EKG Interpretation  Date/Time:    Ventricular Rate:    PR Interval:    QRS Duration:   QT Interval:    QTC Calculation:   R Axis:     Text Interpretation:         Labs Reviewed - No data to display  No orders to display    Medications  amoxicillin (AMOXIL) capsule 500 mg (has no administration in time range)     Procedures  /  Critical Care Procedures  ED Course and Medical Decision Making  I have reviewed the triage vital signs, the nursing notes, and pertinent available records from  the EMR.  Social Determinants Affecting Complexity of Care: Patient has no clinically significant social determinants affecting this chief complaint..   ED Course:    Medical Decision Making Recurrent dental pain.  Seen yesterday for the same.  I switched his pharmacy per his request.  Given lollicaine for pain relief now.  EKG shows no new ischemic changes.  Recommend outpatient dental follow-up.  Amount and/or Complexity of Data Reviewed ECG/medicine tests: ordered.  Risk Prescription drug management.     Consultants: No consultations were needed in caring for this patient.   Treatment and Plan: Emergency department workup does not suggest an emergent condition requiring admission or immediate intervention beyond  what has been performed at this time. The patient is safe for discharge and has  been instructed to return immediately for worsening symptoms, change in  symptoms or any other concerns    Final Clinical Impressions(s) / ED Diagnoses     ICD-10-CM   1. Pain, dental  K08.89       ED Discharge Orders          Ordered    amoxicillin (AMOXIL) 500 MG capsule  3 times daily        02/21/23 0509              Discharge Instructions Discussed  with and Provided to Patient:   Discharge Instructions   None      Roxy Horseman, PA-C 02/21/23 4098    Sabas Sous, MD 02/21/23 [phone]

## 2023-02-24 ENCOUNTER — Emergency Department (HOSPITAL_COMMUNITY)
Admission: EM | Admit: 2023-02-24 | Discharge: 2023-02-24 | Disposition: A | Discharge status: Home / Self Care | Payer: No Typology Code available for payment source | Attending: Emergency Medicine | Admitting: Emergency Medicine

## 2023-02-24 ENCOUNTER — Other Ambulatory Visit: Payer: Self-pay

## 2023-02-24 ENCOUNTER — Encounter (HOSPITAL_COMMUNITY): Payer: Self-pay

## 2023-02-24 DIAGNOSIS — F1010 Alcohol abuse, uncomplicated: Secondary | ICD-10-CM | POA: Diagnosis not present

## 2023-02-24 DIAGNOSIS — R45850 Homicidal ideations: Secondary | ICD-10-CM | POA: Diagnosis not present

## 2023-02-24 DIAGNOSIS — I509 Heart failure, unspecified: Secondary | ICD-10-CM | POA: Diagnosis not present

## 2023-02-24 DIAGNOSIS — I110 Hypertensive heart disease with heart failure: Secondary | ICD-10-CM | POA: Diagnosis not present

## 2023-02-24 DIAGNOSIS — Z5900 Homelessness unspecified: Secondary | ICD-10-CM | POA: Diagnosis not present

## 2023-02-24 LAB — CBC
HCT: 42 % (ref 39.0–52.0)
Hemoglobin: 13.7 g/dL (ref 13.0–17.0)
MCH: 31.1 pg (ref 26.0–34.0)
MCHC: 32.6 g/dL (ref 30.0–36.0)
MCV: 95.2 fL (ref 80.0–100.0)
Platelets: 269 10*3/uL (ref 150–400)
RBC: 4.41 MIL/uL (ref 4.22–5.81)
RDW: 13.8 % (ref 11.5–15.5)
WBC: 3.6 10*3/uL — ABNORMAL LOW (ref 4.0–10.5)
nRBC: 0 % (ref 0.0–0.2)

## 2023-02-24 LAB — COMPREHENSIVE METABOLIC PANEL
ALT: 38 U/L (ref 0–44)
AST: 43 U/L — ABNORMAL HIGH (ref 15–41)
Albumin: 3.9 g/dL (ref 3.5–5.0)
Alkaline Phosphatase: 52 U/L (ref 38–126)
Anion gap: 13 (ref 5–15)
BUN: 13 mg/dL (ref 6–20)
CO2: 19 mmol/L — ABNORMAL LOW (ref 22–32)
Calcium: 8.4 mg/dL — ABNORMAL LOW (ref 8.9–10.3)
Chloride: 102 mmol/L (ref 98–111)
Creatinine, Ser: 0.97 mg/dL (ref 0.61–1.24)
GFR, Estimated: >60 mL/min (ref >60)
Glucose, Bld: 91 mg/dL (ref 70–99)
Potassium: 3.4 mmol/L — ABNORMAL LOW (ref 3.5–5.1)
Sodium: 134 mmol/L — ABNORMAL LOW (ref 135–145)
Total Bilirubin: 0.6 mg/dL (ref 0.3–1.2)
Total Protein: 8.6 g/dL — ABNORMAL HIGH (ref 6.5–8.1)

## 2023-02-24 LAB — ETHANOL: Alcohol, Ethyl (B): 120 mg/dL — ABNORMAL HIGH (ref <10)

## 2023-02-24 LAB — ACETAMINOPHEN LEVEL: Acetaminophen (Tylenol), Serum: <10 ug/mL — ABNORMAL LOW (ref 10–30)

## 2023-02-24 LAB — SALICYLATE LEVEL: Salicylate Lvl: <7 mg/dL — ABNORMAL LOW (ref 7.0–30.0)

## 2023-02-24 NOTE — ED Triage Notes (Addendum)
Patient BIB EMS. Patient reports he has been suicidal x 1 week. Patient reports he took a large amount of fentanyl and alcohol tonight about 30 minutes PTA. Patient also stating that he wants to hang himself with the cords in the room. Patient denies HI. Patient c/o headache and nausea.

## 2023-02-24 NOTE — ED Provider Notes (Signed)
[place] EMERGENCY DEPARTMENT AT [facility] Provider Note   CSN: [id number] Arrival date & time: 02/24/23  0134     History  Chief Complaint  Patient presents with   Suicide Attempt    William Powers is a 42 y.o. male.  The history is provided by the patient and medical records.   42 y.o. M with hx of CHF, alcohol abuse, HIV, substance abuse, hypertension, presenting to the ED homicidal ideation.  Says he has been, for about a week.  He does not disclose any specific plan to me, but states "I have been thinking about it real hard".  Apparently told triage RN that he may want to hang himself with the cords.  He admits to using fentanyl and drinking alcohol prior to arrival tonight.  He denies HI/AVH.  Patient is asking to go back to sleep after my assessment.  Home Medications Prior to Admission medications   Medication Sig Start Date End Date Taking? Authorizing Provider  amoxicillin (AMOXIL) 500 MG capsule Take 1 capsule (500 mg total) by mouth 3 (three) times daily. 02/21/23   Roxy Horseman, PA-C  Potassium Chloride ER 20 MEQ TBCR Take 20 mEq by mouth daily at 6 (six) AM. Patient not taking: No sig reported 08/07/21 09/05/21  Bobette Mo, MD      Allergies    Bee venom, Zestril [lisinopril], and Bactrim [sulfamethoxazole-trimethoprim]    Review of Systems   Review of Systems  Psychiatric/Behavioral:  Positive for suicidal ideas.   All other systems reviewed and are negative.   Physical Exam Updated Vital Signs BP 118/74   Pulse 92   Temp 98.7 F (37.1 C) (Oral)   Resp 16   Ht 6\' 4"  (1.93 m)   Wt 108.8 kg   SpO2 94%   BMI 29.21 kg/m  Physical Exam Vitals and nursing note reviewed.  Constitutional:      Appearance: He is well-developed.  HENT:     Head: Normocephalic and atraumatic.  Eyes:     Conjunctiva/sclera: Conjunctivae normal.     Pupils: Pupils are equal, round, and reactive to light.  Cardiovascular:     Rate and Rhythm:  Normal rate and regular rhythm.     Heart sounds: Normal heart sounds.  Pulmonary:     Effort: No respiratory distress.     Breath sounds: Normal breath sounds. No rhonchi.  Abdominal:     General: Bowel sounds are normal.     Palpations: Abdomen is soft.  Musculoskeletal:        General: Normal range of motion.     Cervical back: Normal range of motion.  Skin:    General: Skin is warm and dry.  Neurological:     Mental Status: He is alert and oriented to person, place, and time.  Psychiatric:     Comments: Vague SI, no plan voiced Denies HI/AVH     ED Results / Procedures / Treatments   Labs (all labs ordered are listed, but only abnormal results are displayed) Labs Reviewed  ACETAMINOPHEN LEVEL - Abnormal; Notable for the following components:      Result Value   Acetaminophen (Tylenol), Serum <10 (*)    All other components within normal limits  COMPREHENSIVE METABOLIC PANEL - Abnormal; Notable for the following components:   Sodium 134 (*)    Potassium 3.4 (*)    CO2 19 (*)    Calcium 8.4 (*)    Total Protein 8.6 (*)    AST  43 (*)    All other components within normal limits  ETHANOL - Abnormal; Notable for the following components:   Alcohol, Ethyl (B) 120 (*)    All other components within normal limits  CBC - Abnormal; Notable for the following components:   WBC 3.6 (*)    All other components within normal limits  SALICYLATE LEVEL - Abnormal; Notable for the following components:   Salicylate Lvl <7.0 (*)    All other components within normal limits  RAPID URINE DRUG SCREEN, HOSP PERFORMED    EKG None  Radiology No results found.  Procedures Procedures    Medications Ordered in ED Medications - No data to display  ED Course/ Medical Decision Making/ A&P                             Medical Decision Making Amount and/or Complexity of Data Reviewed Labs: ordered. ECG/medicine tests: ordered and independent interpretation performed.   42  y.o. M here with reported SI.  No plan voiced but states "I've really been thinking about it".  Apparently voiced to RN after arrival that he was going to hang himself with hospital cords.  He is sleeping peacefully on my exam.  He admits that he is very tired.  Labs were obtained from triage-- ethanol 120, very mild electrolyte abnormalities.  UDS pending.  Patient very well known to this hospital system for same, usually presenting at night when in need of shelter.  It does happen to be raining tonight.  I do not feel that he is a danger to himself at this time.  He has had multiple prior psychiatric evaluations without hospitalization, most recent 02/13/23.  He appears at his baseline, usual presentation of SI without plan vocalized.  Do not feel he needs another emergent psychiatric consultation.  He was allowed to sleep here overnight, plan to d/c home with OP resources.  Can return here for new concerns.  Final Clinical Impression(s) / ED Diagnoses Final diagnoses:  Homelessness    Rx / DC Orders ED Discharge Orders     None         [name], PA-C 02/24/23 1610    Dione Booze, MD 02/24/23 [phone]

## 2023-02-24 NOTE — Discharge Instructions (Addendum)
Please follow-up with [facility]-- can call or you can walk in when needed.

## 2023-03-07 ENCOUNTER — Other Ambulatory Visit: Payer: Self-pay

## 2023-03-07 ENCOUNTER — Emergency Department (HOSPITAL_COMMUNITY)
Admission: EM | Admit: 2023-03-07 | Discharge: 2023-03-07 | Disposition: A | Discharge status: Home / Self Care | Payer: No Typology Code available for payment source | Attending: Emergency Medicine | Admitting: Emergency Medicine

## 2023-03-07 ENCOUNTER — Encounter (HOSPITAL_COMMUNITY): Payer: Self-pay | Admitting: Emergency Medicine

## 2023-03-07 DIAGNOSIS — F10220 Alcohol dependence with intoxication, uncomplicated: Secondary | ICD-10-CM | POA: Insufficient documentation

## 2023-03-07 DIAGNOSIS — R45851 Suicidal ideations: Secondary | ICD-10-CM | POA: Diagnosis not present

## 2023-03-07 DIAGNOSIS — F10920 Alcohol use, unspecified with intoxication, uncomplicated: Secondary | ICD-10-CM

## 2023-03-07 LAB — COMPREHENSIVE METABOLIC PANEL
ALT: 34 U/L (ref 0–44)
AST: 38 U/L (ref 15–41)
Albumin: 3.4 g/dL — ABNORMAL LOW (ref 3.5–5.0)
Alkaline Phosphatase: 51 U/L (ref 38–126)
Anion gap: 12 (ref 5–15)
BUN: 15 mg/dL (ref 6–20)
CO2: 20 mmol/L — ABNORMAL LOW (ref 22–32)
Calcium: 8.6 mg/dL — ABNORMAL LOW (ref 8.9–10.3)
Chloride: 106 mmol/L (ref 98–111)
Creatinine, Ser: 1.08 mg/dL (ref 0.61–1.24)
GFR, Estimated: >60 mL/min (ref >60)
Glucose, Bld: 91 mg/dL (ref 70–99)
Potassium: 3.5 mmol/L (ref 3.5–5.1)
Sodium: 138 mmol/L (ref 135–145)
Total Bilirubin: 0.4 mg/dL (ref 0.3–1.2)
Total Protein: 7.8 g/dL (ref 6.5–8.1)

## 2023-03-07 LAB — CBC
HCT: 39.4 % (ref 39.0–52.0)
Hemoglobin: 13.1 g/dL (ref 13.0–17.0)
MCH: 31 pg (ref 26.0–34.0)
MCHC: 33.2 g/dL (ref 30.0–36.0)
MCV: 93.4 fL (ref 80.0–100.0)
Platelets: 282 10*3/uL (ref 150–400)
RBC: 4.22 MIL/uL (ref 4.22–5.81)
RDW: 13.6 % (ref 11.5–15.5)
WBC: 3 10*3/uL — ABNORMAL LOW (ref 4.0–10.5)
nRBC: 0 % (ref 0.0–0.2)

## 2023-03-07 LAB — ETHANOL: Alcohol, Ethyl (B): 115 mg/dL — ABNORMAL HIGH (ref <10)

## 2023-03-07 NOTE — Discharge Instructions (Signed)
Go to the [facility] Care as needed.

## 2023-03-07 NOTE — ED Notes (Signed)
Pt unable to void at this time. 

## 2023-03-07 NOTE — ED Provider Notes (Signed)
[place] EMERGENCY DEPARTMENT AT [facility] Provider Note   CSN: [id number] Arrival date & time: 03/07/23  0022     History  Chief Complaint  Patient presents with   Suicidal    William Powers is a 42 y.o. male.  42 year old male presents with complaint of feeling suicidal.  States that he has a lot going on right now.  When asked to clarify he says "its just a little bit of everything."  Unable to recall how much alcohol he had to drink tonight.  Denies drug use.  No other complaints or concerns tonight.  No specific plan.       Home Medications Prior to Admission medications   Medication Sig Start Date End Date Taking? Authorizing Provider  amoxicillin (AMOXIL) 500 MG capsule Take 1 capsule (500 mg total) by mouth 3 (three) times daily. 02/21/23   Roxy Horseman, PA-C  Potassium Chloride ER 20 MEQ TBCR Take 20 mEq by mouth daily at 6 (six) AM. Patient not taking: No sig reported 08/07/21 09/05/21  Bobette Mo, MD      Allergies    Bee venom, Zestril [lisinopril], and Bactrim [sulfamethoxazole-trimethoprim]    Review of Systems   Review of Systems Negative except as per HPI Physical Exam Updated Vital Signs BP 113/81   Pulse (!) 102   Temp 97.6 F (36.4 C)   Resp 16   Ht 6\' 4"  (1.93 m)   Wt 108.8 kg   SpO2 97%   BMI 29.20 kg/m  Physical Exam Vitals and nursing note reviewed.  Constitutional:      General: He is not in acute distress.    Appearance: He is well-developed. He is not diaphoretic.  HENT:     Head: Normocephalic and atraumatic.  Pulmonary:     Effort: Pulmonary effort is normal.  Abdominal:     Palpations: Abdomen is soft.     Tenderness: There is no abdominal tenderness.  Skin:    General: Skin is warm and dry.     Findings: No erythema or rash.  Neurological:     Mental Status: He is alert and oriented to person, place, and time.  Psychiatric:        Behavior: Behavior normal.     ED Results / Procedures /  Treatments   Labs (all labs ordered are listed, but only abnormal results are displayed) Labs Reviewed  COMPREHENSIVE METABOLIC PANEL - Abnormal; Notable for the following components:      Result Value   CO2 20 (*)    Calcium 8.6 (*)    Albumin 3.4 (*)    All other components within normal limits  ETHANOL - Abnormal; Notable for the following components:   Alcohol, Ethyl (B) 115 (*)    All other components within normal limits  CBC - Abnormal; Notable for the following components:   WBC 3.0 (*)    All other components within normal limits  RAPID URINE DRUG SCREEN, HOSP PERFORMED    EKG None  Radiology No results found.  Procedures Procedures    Medications Ordered in ED Medications - No data to display  ED Course/ Medical Decision Making/ A&P                             Medical Decision Making Amount and/or Complexity of Data Reviewed Labs: ordered.   42 year old male presents the ER with complaint of suicidal ideation.  History of multiple visits  for same.  Specifically has had 32 visits in the past 6 months to this emergency room.  Patient frequently presents intoxicated with SI without plan.  Patient is resting comfortably.  He has no other complaints at this time.  His alcohol is mildly elevated.  Plan is to hold in the ER and reevaluate, can likely be discharged to follow-up with behavioral health urgent care.        Final Clinical Impression(s) / ED Diagnoses Final diagnoses:  Alcoholic intoxication without complication [facility])    Rx / DC Orders ED Discharge Orders     None         Alden Hipp 03/07/23 0504    Tilden Fossa, MD 03/07/23 [phone]

## 2023-03-07 NOTE — ED Notes (Signed)
When discharging patient, he advised that he did not want them, as he felt we did not help him.

## 2023-03-07 NOTE — ED Notes (Signed)
Patient refused full vitals upon d/c.

## 2023-03-07 NOTE — ED Triage Notes (Signed)
Pt sts suicidal ideation for the past 8 hours. Sts he doesn't want to live after Grandmother passed away. Endorse ETOH use today. Sts everyday drinking for past 4 months.

## 2023-03-09 DIAGNOSIS — F1020 Alcohol dependence, uncomplicated: Secondary | ICD-10-CM | POA: Diagnosis not present

## 2023-03-09 DIAGNOSIS — R45851 Suicidal ideations: Secondary | ICD-10-CM | POA: Diagnosis not present

## 2023-03-09 DIAGNOSIS — F332 Major depressive disorder, recurrent severe without psychotic features: Secondary | ICD-10-CM | POA: Insufficient documentation

## 2023-03-10 ENCOUNTER — Encounter (HOSPITAL_COMMUNITY): Payer: Self-pay | Admitting: Emergency Medicine

## 2023-03-10 ENCOUNTER — Emergency Department (HOSPITAL_COMMUNITY)
Admission: EM | Admit: 2023-03-10 | Discharge: 2023-03-10 | Disposition: A | Discharge status: Home / Self Care | Payer: No Typology Code available for payment source | Attending: Emergency Medicine | Admitting: Emergency Medicine

## 2023-03-10 ENCOUNTER — Other Ambulatory Visit: Payer: Self-pay

## 2023-03-10 DIAGNOSIS — R45851 Suicidal ideations: Secondary | ICD-10-CM

## 2023-03-10 LAB — COMPREHENSIVE METABOLIC PANEL
ALT: 41 U/L (ref 0–44)
AST: 49 U/L — ABNORMAL HIGH (ref 15–41)
Albumin: 3.7 g/dL (ref 3.5–5.0)
Alkaline Phosphatase: 44 U/L (ref 38–126)
Anion gap: 12 (ref 5–15)
BUN: 16 mg/dL (ref 6–20)
CO2: 20 mmol/L — ABNORMAL LOW (ref 22–32)
Calcium: 8.5 mg/dL — ABNORMAL LOW (ref 8.9–10.3)
Chloride: 103 mmol/L (ref 98–111)
Creatinine, Ser: 1.19 mg/dL (ref 0.61–1.24)
GFR, Estimated: >60 mL/min (ref >60)
Glucose, Bld: 93 mg/dL (ref 70–99)
Potassium: 3.5 mmol/L (ref 3.5–5.1)
Sodium: 135 mmol/L (ref 135–145)
Total Bilirubin: 0.9 mg/dL (ref 0.3–1.2)
Total Protein: 8.7 g/dL — ABNORMAL HIGH (ref 6.5–8.1)

## 2023-03-10 LAB — CBC
HCT: 41.3 % (ref 39.0–52.0)
Hemoglobin: 13.7 g/dL (ref 13.0–17.0)
MCH: 30.7 pg (ref 26.0–34.0)
MCHC: 33.2 g/dL (ref 30.0–36.0)
MCV: 92.6 fL (ref 80.0–100.0)
Platelets: 276 10*3/uL (ref 150–400)
RBC: 4.46 MIL/uL (ref 4.22–5.81)
RDW: 13.3 % (ref 11.5–15.5)
WBC: 3.1 10*3/uL — ABNORMAL LOW (ref 4.0–10.5)
nRBC: 0 % (ref 0.0–0.2)

## 2023-03-10 LAB — ACETAMINOPHEN LEVEL: Acetaminophen (Tylenol), Serum: <10 ug/mL — ABNORMAL LOW (ref 10–30)

## 2023-03-10 LAB — ETHANOL: Alcohol, Ethyl (B): 86 mg/dL — ABNORMAL HIGH (ref <10)

## 2023-03-10 LAB — SALICYLATE LEVEL: Salicylate Lvl: <7 mg/dL — ABNORMAL LOW (ref 7.0–30.0)

## 2023-03-10 MED ORDER — SODIUM CHLORIDE 0.9 % IV BOLUS
1000.0000 mL | Freq: Once | INTRAVENOUS | Status: AC
  Administered 2023-03-10: 1000 mL via INTRAVENOUS

## 2023-03-10 NOTE — BH Assessment (Signed)
Comprehensive Clinical Assessment (CCA) Note    03/10/2023 William Powers [id number]  Disposition: [name], NP recommends continuous observation and reassessed in AM.    The patient demonstrates the following risk factors for suicide: Chronic risk factors for suicide include: previous suicide attempts   . Acute risk factors for suicide include: loss (financial, interpersonal, professional). Protective factors for this patient include: hope for the future. Considering these factors, the overall suicide risk at this point appears to be low. Patient is appropriate for outpatient follow up.      Pt is a 42 y.o.male who presents to the [facility] voluntarily due to feeling suicidal. Pt reported that he attempted to harm himself tonight by drinking ETOH and taking an unknown white substance that he found on the side of the road today. Pt reports that he believes that it was acid. Pt reports that he has been feeling suicidal for the past two days due to finanical stressors. Pt reports that he has hx of SI attempts with the last one approximetly a year ago. Pt denies HI, AVH, and paranioa. Pt reports that he is currently not active with outpatient services. Pt reports his last IP hospitalization was 6 months ago at [facility].  Pt reports that he currently is staying alone in a hotel. Pt reports that he is divorced with no kids. Pt is currently on disability for  heart failure.   Pt is dressed in hospital scrubs, alert and oriented x4. Pt speaks in a clear tone, at moderate volume and normal pace. Motor behavior appears normal. Eye contact is good. Pt's mood is depressed and affect is congruent with mood. Thought process is coherent and relevant. There is no indication from Pt's behavior that he is currently responding to internal stimuli or experiencing delusional thought content. He is cooperative and says he needs to be in a facility in order to be safe and not act on suicidal thoughts.    Chief Complaint:  Chief Complaint  Patient presents with   Suicidal   Visit Diagnosis:  F33.2 Major depressive disorder, Recurrent episode, Severe F10.20 Alcohol use disorder, Severe    CCA Screening, Triage and Referral (STR)  Patient Reported Information How did you hear about [place]? Other (Comment) ([facility])  What Is the Reason for Your Visit/Call Today? Pt is a 42 y.o.male who presents to the [facility] voluntarily due to feeling suicidal. Pt reported that he attempted to harm himself tonight by drinking ETOH and taking an unknown white substance that he found on the side of the road today. Pt reports that he believes that it was acid. Pt reports that he has been feeling suicidal for the past two days due to finanical stressors. Pt reports that he has hx of SI attempts with the last one approximetly a year ago. Pt denies HI, AVH, and paranioa. Pt reports that he is currently not active with outpatient services. Pt reports his last IP hospitalization was 6 months ago at [facility].  How Long Has This Been Causing You Problems? <Week  What Do You Feel Would Help You the Most Today? Treatment for Depression or other mood problem; Stress Management; Medication(s)   Have You Recently Had Any Thoughts About Hurting Yourself? Yes  Are You Planning to Commit Suicide/Harm Yourself At This time? No   Flowsheet Row ED from 03/10/2023 in [facility] Emergency Department at [facility] ED from 03/07/2023 in [facility] Emergency Department at [facility] ED from 02/24/2023 in  [place] Emergency Department at [facility]  C-SSRS RISK CATEGORY Moderate Risk High Risk High Risk       Have you Recently Had Thoughts About Hurting Someone [name]? No  Are You Planning to Harm Someone at This Time? No  Explanation: Pt denies HI   Have You Used Any Alcohol or Drugs in the Past 24 Hours? Yes  What Did You Use and How Much? Pt reports drinking alcohol and taking an  unknown white substance   Do You Currently Have a Therapist/Psychiatrist? No  Name of Therapist/Psychiatrist: Name of Therapist/Psychiatrist: Denies outpatient services   Have You Been Recently Discharged From Any Office Practice or Programs? No  Explanation of Discharge From Practice/Program: n/a     CCA Screening Triage Referral Assessment Type of Contact: Tele-Assessment  Telemedicine Service Delivery: Telemedicine service delivery: This service was provided via telemedicine using a 2-way, interactive audio and video technology  Is this Initial or Reassessment? Is this Initial or Reassessment?: Initial Assessment  Date Telepsych consult ordered in [facility]:  Date Telepsych consult ordered in [facility]: 03/10/23  Time Telepsych consult ordered in [facility]:  Time Telepsych consult ordered in [facility]: 0235  Location of Assessment: [facility] ED  Provider Location: [facility] Assessment Services   Collateral Involvement: none reported   Does Patient Have a Automotive engineer Guardian? No  Legal Guardian Contact Information: n/a  Copy of Legal Guardianship Form: -- (n/a)  Legal Guardian Notified of Arrival: -- (n/a)  Legal Guardian Notified of Pending Discharge: -- (n/a)  If Minor and Not Living with Parent(s), Who has Custody? n/a  Is CPS involved or ever been involved? Never  Is APS involved or ever been involved? Never   Patient Determined To Be At Risk for Harm To Self or Others Based on Review of Patient Reported Information or Presenting Complaint? Yes, for Self-Harm  Method: Plan with intent and identified person (Pt attempted to drink etoh and an unknown white substance to harm self.)  Availability of Means: In hand or used (Pt reports he attempted suicide tonight by trying to stangle himself with a cord. He denies homicidal ideation.)  Intent: Clearly intends on inflicting harm that could cause death (Pt reports he attempted suicide tonight by trying to stangle himself with a cord.  He denies homicidal ideation.)  Notification Required: No need or identified person  Additional Information for Danger to Others Potential: Previous attempts (Pt reports previous suicide attempts)  Additional Comments for Danger to Others Potential: Pt denies history of violence  Are There Guns or Other Weapons in Your Home? No  Types of Guns/Weapons: Pt denies access to guns/weapons  Are These Weapons Safely Secured?                            No  Who Could Verify You Are Able To Have These Secured: Denies access to guns/weapons  Do You Have any Outstanding Charges, Pending Court Dates, Parole/Probation? Denies legal concerns  Contacted To Inform of Risk of Harm To Self or Others: -- (n/a)    Does Patient Present under Involuntary Commitment? No    [place] of Residence: Other (Comment) [facility])   Patient Currently Receiving the Following Services: Not Receiving Services   Determination of Need: Urgent (48 hours)   Options For Referral: Outpatient Therapy; Medication Management; BH Urgent Care     CCA Biopsychosocial Patient Reported Schizophrenia/Schizoaffective Diagnosis in Past: No   Strengths: Pt is motivated for treatment  Mental Health Symptoms Depression:   Change in energy/activity; Difficulty Concentrating; Fatigue; Hopelessness; Worthlessness; Tearfulness; Sleep (too much or little); Irritability   Duration of Depressive symptoms:    Mania:   None   Anxiety:    Difficulty concentrating; Fatigue; Irritability; Restlessness; Sleep; Tension; Worrying   Psychosis:   Hallucinations   Duration of Psychotic symptoms:    Trauma:   None   Obsessions:   None   Compulsions:   None   Inattention:   None   Hyperactivity/Impulsivity:   None   Oppositional/Defiant Behaviors:   None   Emotional Irregularity:   Recurrent suicidal behaviors/gestures/threats   Other Mood/Personality Symptoms:   Depression    Mental Status Exam Appearance  and self-care  Stature:   Average   Weight:   Average weight   Clothing:   -- (Scrubs)   Grooming:   Normal   Cosmetic use:   None   Posture/gait:   Normal   Motor activity:   Not Remarkable   Sensorium  Attention:   Normal   Concentration:   Normal   Orientation:   Time; Situation; Place; Person   Recall/memory:   Normal   Affect and Mood  Affect:   Congruent   Mood:   Depressed   Relating  Eye contact:   Normal   Facial expression:   Responsive   Attitude toward examiner:   Cooperative   Thought and Language  Speech flow:  Normal   Thought content:   Appropriate to Mood and Circumstances   Preoccupation:   None   Hallucinations:   Auditory; Visual   Organization:   Patent examiner of Knowledge:   Fair   Intelligence:   Average   Abstraction:   Normal   Judgement:   Poor   Reality Testing:   Adequate   Insight:   Poor   Decision Making:   Impulsive   Social Functioning  Social Maturity:   Isolates   Social Judgement:   "Street Smart"   Stress  Stressors:   Surveyor, quantity; Housing   Coping Ability:   Overwhelmed; Deficient supports   Skill Deficits:   Decision making; Responsibility   Supports:   Support needed     Religion: Religion/Spirituality Are You A Religious Person?: Yes What is Your Religious Affiliation?: Christian How Might This Affect Treatment?: None.  Leisure/Recreation: Leisure / Recreation Do You Have Hobbies?: No  Exercise/Diet: Exercise/Diet Do You Exercise?: No Have You Gained or Lost A Significant Amount of Weight in the Past Six Months?: No Do You Follow a Special Diet?: No Do You Have Any Trouble Sleeping?: Yes Explanation of Sleeping Difficulties: 1 hr nightly   CCA Employment/Education Employment/Work Situation: Employment / Work Situation Employment Situation: On disability Why is Patient on Disability: Heart failure. How Long has Patient  Been on Disability: Since 43. Patient's Job has Been Impacted by Current Illness: No Has Patient ever Been in the [employer]?: No  Education: Education Is Patient Currently Attending School?: No Last Grade Completed: 12 Did You Attend College?: No Did You Have An Individualized Education Program (IIEP): No Did You Have Any Difficulty At School?: No Patient's Education Has Been Impacted by Current Illness: No   CCA Family/Childhood History Family and Relationship History: Family history Marital status: Divorced Divorced, when?: Per chart, pt was married for 3 months and then divorced What types of issues is patient dealing with in the relationship?: unsure Additional relationship information: n/a Does patient have children?: No  Childhood History:  Childhood History By whom was/is the patient raised?: Mother Did patient suffer any verbal/emotional/physical/sexual abuse as a child?: No Did patient suffer from severe childhood neglect?: No Has patient ever been sexually abused/assaulted/raped as an adolescent or adult?: No Was the patient ever a victim of a crime or a disaster?: No Witnessed domestic violence?: No Has patient been affected by domestic violence as an adult?: No       CCA Substance Use Alcohol/Drug Use: Alcohol / Drug Use Pain Medications: See MAR Prescriptions: See MAR Over the Counter: See MAR History of alcohol / drug use?: Yes Longest period of sobriety (when/how long): Unsure. Negative Consequences of Use: Personal relationships, Financial Withdrawal Symptoms: None Substance #1 Name of Substance 1: ETOH 1 - Age of First Use: 21 1 - Amount (size/oz): one pint 1 - Frequency: daily 1 - Duration: UTA 1 - Last Use / Amount: Yesterday 03/09/23 1 - Method of Aquiring: UTA 1- Route of Use: UTA                       ASAM's:  Six Dimensions of Multidimensional Assessment  Dimension 1:  Acute Intoxication and/or Withdrawal Potential:    Dimension 1:  Description of individual's past and current experiences of substance use and withdrawal: Pt reports drinking alcohol almost daily  Dimension 2:  Biomedical Conditions and Complications:   Dimension 2:  Description of patient's biomedical conditions and  complications: Pt has the following diagnoses: History of CHF (congestive heart failure), HIV (human immunodeficiency virus infection) (HCC), HSV-2 (herpes simplex virus 2) infection. Pt reports, he's not currently seeing a Cardiologist regularly but on a as needed basis.  Dimension 3:  Emotional, Behavioral, or Cognitive Conditions and Complications:  Dimension 3:  Description of emotional, behavioral, or cognitive conditions and complications: Pt has the following diagnoses: Alcohol use disorder, severe, dependence (HCC), Recurrent major depressive episodes, moderate (HCC), Adjustment disorder with depressed mood.  Dimension 4:  Readiness to Change:  Dimension 4:  Description of Readiness to Change criteria: During the assessment pt did not disclose wanting to stop using substances.  Dimension 5:  Relapse, Continued use, or Continued Problem Potential:  Dimension 5:  Relapse, continued use, or continued problem potential critiera description: Pt has ongoing substance use.  Dimension 6:  Recovery/Living Environment:  Dimension 6:  Recovery/Iiving environment criteria description: Pt is chronically homeless  ASAM Severity Score: ASAM's Severity Rating Score: 12  ASAM Recommended Level of Treatment: ASAM Recommended Level of Treatment: Level III Residential Treatment   Substance use Disorder (SUD) Substance Use Disorder (SUD)  Checklist Symptoms of Substance Use: Continued use despite having a persistent/recurrent physical/psychological problem caused/exacerbated by use, Continued use despite persistent or recurrent social, interpersonal problems, caused or exacerbated by use, Evidence of tolerance, Substance(s) often taken in larger  amounts or over longer times than was intended, Presence of craving or strong urge to use, Persistent desire or unsuccessful efforts to cut down or control use  Recommendations for Services/Supports/Treatments: Recommendations for Services/Supports/Treatments Recommendations For Services/Supports/Treatments: CD-IOP Intensive Chemical Dependency Program, Medication Management  Discharge Disposition:    DSM5 Diagnoses: Patient Active Problem List   Diagnosis Date Noted   Dental caries 09/13/2022   Substance abuse (HCC) 05/03/2022   Polysubstance abuse (HCC) 04/29/2022   Alcohol use disorder, severe, dependence (HCC) 03/16/2022   Recurrent major depressive episodes, moderate (HCC) 02/23/2022   Alcohol abuse    Suicidal ideation    Vitamin B1 deficiency 09/19/2021   Adjustment  disorder with depressed mood    History of CHF (congestive heart failure) 08/07/2021   H/O ETOH abuse 08/07/2021   Chronic HFrEF (heart failure with reduced ejection fraction) (HCC) 07/23/2021   HIV (human immunodeficiency virus infection) (HCC) 07/23/2021   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   Tinea versicolor 09/17/2018   History of tobacco abuse 09/16/2018   Dilated cardiomyopathy (HCC) 09/13/2018     Referrals to Alternative Service(s): Referred to Alternative Service(s):   Place:   Date:   Time:    Referred to Alternative Service(s):   Place:   Date:   Time:    Referred to Alternative Service(s):   Place:   Date:   Time:    Referred to Alternative Service(s):   Place:   Date:   Time:     Dava Najjar, MA,LCMHCA,NCC

## 2023-03-10 NOTE — ED Triage Notes (Addendum)
Pt reports SI x 2 days, pt reports he drank "acid", when asked what type of acid he states "I don't know. It was a white substance"

## 2023-03-10 NOTE — ED Notes (Signed)
Pt provided with scrubs and instructed to change into them. Pt understood and agreed. Will check back and remove belongings.

## 2023-03-10 NOTE — ED Notes (Signed)
Pt denies SI.  

## 2023-03-10 NOTE — ED Notes (Signed)
[name], NP is recommending the patient to be reassessed during day shift.

## 2023-03-10 NOTE — ED Notes (Addendum)
Pt wanded by security and escorted to triage 9. Pt has one pt belonging bag located at triage nurses station. Belongings include clothing, hat, and shoes. Pt resting comfortably at this time.

## 2023-03-10 NOTE — ED Provider Notes (Signed)
  Physical Exam  BP (!) 111/58   Pulse 88   Temp 98.5 F (36.9 C) (Oral)   Resp 16   Ht 6\' 4"  (1.93 m)   Wt 108.4 kg   SpO2 98%   BMI 29.09 kg/m   Physical Exam  Procedures  Procedures  ED Course / MDM   Clinical Course as of 03/10/23 1128  Tue Mar 10, 2023  0431 Patient medically clear for TTS eval. [RB]    Clinical Course User Index [RB] Roxy Horseman, PA-C   Medical Decision Making Amount and/or Complexity of Data Reviewed Labs: ordered. ECG/medicine tests: ordered.   Patient feeling better.  States he is no longer suicidal.  Has had frequent visits for same.  Do not think we need further observation in the ER.  Appears stable for discharge home.  Does not appear to be an acute risk to himself but I think he is likely at high risk to return to the ER.       Benjiman Core, MD 03/10/23 1128

## 2023-03-10 NOTE — ED Provider Notes (Signed)
[facility]-EMERGENCY DEPT [facility] Emergency Department Provider Note MRN:  [id number]  Arrival date & time: 03/10/23     Chief Complaint   Suicidal   History of Present Illness   William Powers is a 42 y.o. year-old male presents to the ED with chief complaint of SI.  States that he drank ETOH and an unknown white substance that he found on the side of the road today.  He states that he feels suicidal and so he has been drinking a lot.  He denies any recent illness. Marland Kitchen  History provided by patient.   Review of Systems  Pertinent positive and negative review of systems noted in HPI.    Physical Exam   Vitals:   03/10/23 0208 03/10/23 0349  BP: (!) 99/55 (!) 111/58  Pulse: 91 88  Resp: 16 16  Temp:    SpO2: 98% 98%    CONSTITUTIONAL:  non toxic-appearing, NAD NEURO:  Alert and oriented x 3, CN 3-12 grossly intact EYES:  eyes equal and reactive ENT/NECK:  Supple, no stridor  CARDIO:  tachycardic, appears well-perfused  PULM:  No respiratory distress,  GI/GU:  non-distended,  MSK/SPINE:  No gross deformities, no edema, moves all extremities  SKIN:  no rash, atraumatic   *Additional and/or pertinent findings included in MDM below  Diagnostic and Interventional Summary    EKG Interpretation  Date/Time:  Tuesday Mar 10 2023 02:29:22 EDT Ventricular Rate:  93 PR Interval:  185 QRS Duration: 93 QT Interval:  369 QTC Calculation: 459 R Axis:   57 Text Interpretation: Sinus rhythm Left atrial enlargement Abnormal R-wave progression, early transition Borderline ST elevation, lateral leads No significant change since last tracing Confirmed by Tilden Fossa [id number]) on 03/10/2023 3:12:08 AM       Labs Reviewed  COMPREHENSIVE METABOLIC PANEL - Abnormal; Notable for the following components:      Result Value   CO2 20 (*)    Calcium 8.5 (*)    Total Protein 8.7 (*)    AST 49 (*)    All other components within normal limits  ETHANOL - Abnormal; Notable for the  following components:   Alcohol, Ethyl (B) 86 (*)    All other components within normal limits  SALICYLATE LEVEL - Abnormal; Notable for the following components:   Salicylate Lvl <7.0 (*)    All other components within normal limits  ACETAMINOPHEN LEVEL - Abnormal; Notable for the following components:   Acetaminophen (Tylenol), Serum <10 (*)    All other components within normal limits  CBC - Abnormal; Notable for the following components:   WBC 3.1 (*)    All other components within normal limits  RAPID URINE DRUG SCREEN, HOSP PERFORMED    No orders to display    Medications  sodium chloride 0.9 % bolus 1,000 mL (1,000 mLs Intravenous New Bag/Given 03/10/23 0233)     Procedures  /  Critical Care Procedures  ED Course and Medical Decision Making  I have reviewed the triage vital signs, the nursing notes, and pertinent available records from the EMR.  Social Determinants Affecting Complexity of Care: Patient is homelessness.   ED Course: Clinical Course as of 03/10/23 0432  Tue Mar 10, 2023  6962 Patient medically clear for TTS eval. [RB]    Clinical Course User Index [RB] Roxy Horseman, PA-C    Medical Decision Making Amount and/or Complexity of Data Reviewed Labs: ordered. ECG/medicine tests: ordered.     Consultants: TTS consult pending.   Treatment  and Plan: Dispo per TTS.    Final Clinical Impressions(s) / ED Diagnoses     ICD-10-CM   1. Suicidal ideation  R45.851       ED Discharge Orders     None         Discharge Instructions Discussed with and Provided to Patient:   Discharge Instructions   None      Roxy Horseman, PA-C 03/10/23 0433    Tilden Fossa, MD 03/10/23 [phone]

## 2023-03-10 NOTE — ED Notes (Signed)
Pt states he is ready to go. Explained he is suppose to be reassessed. MD aware

## 2023-03-15 ENCOUNTER — Emergency Department (HOSPITAL_COMMUNITY)
Admission: EM | Admit: 2023-03-15 | Discharge: 2023-03-15 | Disposition: A | Discharge status: Home / Self Care | Payer: No Typology Code available for payment source | Attending: Emergency Medicine | Admitting: Emergency Medicine

## 2023-03-15 ENCOUNTER — Encounter (HOSPITAL_COMMUNITY): Payer: Self-pay

## 2023-03-15 ENCOUNTER — Emergency Department (HOSPITAL_COMMUNITY): Payer: No Typology Code available for payment source

## 2023-03-15 ENCOUNTER — Other Ambulatory Visit: Payer: Self-pay

## 2023-03-15 DIAGNOSIS — F10920 Alcohol use, unspecified with intoxication, uncomplicated: Secondary | ICD-10-CM | POA: Diagnosis present

## 2023-03-15 DIAGNOSIS — R519 Headache, unspecified: Secondary | ICD-10-CM | POA: Diagnosis not present

## 2023-03-15 DIAGNOSIS — W0110XA Fall on same level from slipping, tripping and stumbling with subsequent striking against unspecified object, initial encounter: Secondary | ICD-10-CM | POA: Diagnosis not present

## 2023-03-15 DIAGNOSIS — Y906 Blood alcohol level of 120-199 mg/100 ml: Secondary | ICD-10-CM | POA: Insufficient documentation

## 2023-03-15 LAB — CBC
HCT: 41.1 % (ref 39.0–52.0)
Hemoglobin: 13.7 g/dL (ref 13.0–17.0)
MCH: 31 pg (ref 26.0–34.0)
MCHC: 33.3 g/dL (ref 30.0–36.0)
MCV: 93 fL (ref 80.0–100.0)
Platelets: 268 10*3/uL (ref 150–400)
RBC: 4.42 MIL/uL (ref 4.22–5.81)
RDW: 13.3 % (ref 11.5–15.5)
WBC: 3.6 10*3/uL — ABNORMAL LOW (ref 4.0–10.5)
nRBC: 0 % (ref 0.0–0.2)

## 2023-03-15 LAB — COMPREHENSIVE METABOLIC PANEL
ALT: 44 U/L (ref 0–44)
AST: 50 U/L — ABNORMAL HIGH (ref 15–41)
Albumin: 3.6 g/dL (ref 3.5–5.0)
Alkaline Phosphatase: 48 U/L (ref 38–126)
Anion gap: 12 (ref 5–15)
BUN: 15 mg/dL (ref 6–20)
CO2: 21 mmol/L — ABNORMAL LOW (ref 22–32)
Calcium: 8.4 mg/dL — ABNORMAL LOW (ref 8.9–10.3)
Chloride: 105 mmol/L (ref 98–111)
Creatinine, Ser: 1 mg/dL (ref 0.61–1.24)
GFR, Estimated: >60 mL/min (ref >60)
Glucose, Bld: 94 mg/dL (ref 70–99)
Potassium: 3.3 mmol/L — ABNORMAL LOW (ref 3.5–5.1)
Sodium: 138 mmol/L (ref 135–145)
Total Bilirubin: 0.5 mg/dL (ref 0.3–1.2)
Total Protein: 8.2 g/dL — ABNORMAL HIGH (ref 6.5–8.1)

## 2023-03-15 LAB — SALICYLATE LEVEL: Salicylate Lvl: <7 mg/dL — ABNORMAL LOW (ref 7.0–30.0)

## 2023-03-15 LAB — ACETAMINOPHEN LEVEL: Acetaminophen (Tylenol), Serum: <10 ug/mL — ABNORMAL LOW (ref 10–30)

## 2023-03-15 LAB — ETHANOL: Alcohol, Ethyl (B): 146 mg/dL — ABNORMAL HIGH (ref <10)

## 2023-03-15 NOTE — Discharge Instructions (Signed)
You should stop drinking.  Follow up with your family doctor in the office.

## 2023-03-15 NOTE — ED Notes (Signed)
Pt transferred to ED Room 5 from 20. Belongings transferred to cabinet labeled "Pt belongings 5-8" Two bags with name labels. In formed the nurse that the only order needed to be completed was to collect urine.

## 2023-03-15 NOTE — ED Provider Notes (Signed)
[place] EMERGENCY DEPARTMENT AT [facility] Provider Note   CSN: [id number] Arrival date & time: 03/15/23  0007     History  Chief Complaint  Patient presents with   Alcohol Intoxication    William Powers is a 42 y.o. male.  42 yo M with a chief complaints of alcohol intoxication.  He tells me he only had a couple beers but he is too sleepy to stay awake for history.  Level 5 caveat.  Reportedly had struck his head.  No signs of trauma points to the right side of his head when asked.   Alcohol Intoxication       Home Medications Prior to Admission medications   Medication Sig Start Date End Date Taking? Authorizing Provider  amoxicillin (AMOXIL) 500 MG capsule Take 1 capsule (500 mg total) by mouth 3 (three) times daily. 02/21/23   Roxy Horseman, PA-C  Potassium Chloride ER 20 MEQ TBCR Take 20 mEq by mouth daily at 6 (six) AM. Patient not taking: No sig reported 08/07/21 09/05/21  Bobette Mo, MD      Allergies    Bee venom, Zestril [lisinopril], and Bactrim [sulfamethoxazole-trimethoprim]    Review of Systems   Review of Systems  Physical Exam Updated Vital Signs BP 107/65   Pulse 88   Temp 98.2 F (36.8 C) (Oral)   Resp 17   Ht 6\' 4"  (1.93 m)   Wt 111.1 kg   SpO2 95%   BMI 29.82 kg/m  Physical Exam Vitals and nursing note reviewed.  Constitutional:      Appearance: He is well-developed.  HENT:     Head: Normocephalic and atraumatic.  Eyes:     Pupils: Pupils are equal, round, and reactive to light.  Neck:     Vascular: No JVD.  Cardiovascular:     Rate and Rhythm: Normal rate and regular rhythm.     Heart sounds: No murmur heard.    No friction rub. No gallop.  Pulmonary:     Effort: No respiratory distress.     Breath sounds: No wheezing.  Abdominal:     General: There is no distension.     Tenderness: There is no abdominal tenderness. There is no guarding or rebound.  Musculoskeletal:        General: Normal range  of motion.     Cervical back: Normal range of motion and neck supple.     Comments: Palpated from head to toe without any obvious noted areas of tenderness.  No signs of trauma  Skin:    Coloration: Skin is not pale.     Findings: No rash.  Neurological:     Comments: Awakens to voice  Psychiatric:        Behavior: Behavior normal.     ED Results / Procedures / Treatments   Labs (all labs ordered are listed, but only abnormal results are displayed) Labs Reviewed  COMPREHENSIVE METABOLIC PANEL - Abnormal; Notable for the following components:      Result Value   Potassium 3.3 (*)    CO2 21 (*)    Calcium 8.4 (*)    Total Protein 8.2 (*)    AST 50 (*)    All other components within normal limits  ETHANOL - Abnormal; Notable for the following components:   Alcohol, Ethyl (B) 146 (*)    All other components within normal limits  SALICYLATE LEVEL - Abnormal; Notable for the following components:   Salicylate Lvl <7.0 (*)  All other components within normal limits  ACETAMINOPHEN LEVEL - Abnormal; Notable for the following components:   Acetaminophen (Tylenol), Serum <10 (*)    All other components within normal limits  CBC - Abnormal; Notable for the following components:   WBC 3.6 (*)    All other components within normal limits  RAPID URINE DRUG SCREEN, HOSP PERFORMED    EKG EKG Interpretation  Date/Time:  Sunday Mar 15 2023 00:57:48 EDT Ventricular Rate:  94 PR Interval:  158 QRS Duration: 92 QT Interval:  357 QTC Calculation: 447 R Axis:   55 Text Interpretation: Sinus rhythm Probable left atrial enlargement No significant change since last tracing Confirmed by Melene Plan [phone]) on 03/15/2023 2:03:30 AM  Radiology CT Head Wo Contrast  Result Date: 03/15/2023 CLINICAL DATA:  Head trauma, abnormal mental status.  Neck trauma. EXAM: CT HEAD WITHOUT CONTRAST CT CERVICAL SPINE WITHOUT CONTRAST TECHNIQUE: Multidetector CT imaging of the head and cervical spine was  performed following the standard protocol without intravenous contrast. Multiplanar CT image reconstructions of the cervical spine were also generated. RADIATION DOSE REDUCTION: This exam was performed according to the departmental dose-optimization program which includes automated exposure control, adjustment of the mA and/or kV according to patient size and/or use of iterative reconstruction technique. COMPARISON:  None Available. FINDINGS: CT HEAD FINDINGS Brain: No acute intracranial hemorrhage, midline shift or mass effect. No extra-axial fluid collection. Gray-white matter differentiation is within normal limits. No hydrocephalus. Vascular: No hyperdense vessel or unexpected calcification. Skull: Normal. Negative for fracture or focal lesion. Sinuses/Orbits: Mucosal thickening is present in the ethmoid air cells and maxillary sinuses bilaterally. No acute orbital abnormality. Other: None. CT CERVICAL SPINE FINDINGS Alignment: Normal. Skull base and vertebrae: No acute fracture. No primary bone lesion or focal pathologic process. Soft tissues and spinal canal: No prevertebral fluid or swelling. No visible canal hematoma. Disc levels: Mild degenerative osteophyte formation is present at C5-C6. Upper chest: No acute abnormality. Other: A radiopaque density is noted at the mandibular notch on the left, unchanged from previous exams and likely BB. IMPRESSION: 1. No acute intracranial process. 2. No acute fracture or subluxation in the cervical spine. Electronically Signed   By: Thornell Sartorius M.D.   On: 03/15/2023 01:57   CT Cervical Spine Wo Contrast  Result Date: 03/15/2023 CLINICAL DATA:  Head trauma, abnormal mental status.  Neck trauma. EXAM: CT HEAD WITHOUT CONTRAST CT CERVICAL SPINE WITHOUT CONTRAST TECHNIQUE: Multidetector CT imaging of the head and cervical spine was performed following the standard protocol without intravenous contrast. Multiplanar CT image reconstructions of the cervical spine were  also generated. RADIATION DOSE REDUCTION: This exam was performed according to the departmental dose-optimization program which includes automated exposure control, adjustment of the mA and/or kV according to patient size and/or use of iterative reconstruction technique. COMPARISON:  None Available. FINDINGS: CT HEAD FINDINGS Brain: No acute intracranial hemorrhage, midline shift or mass effect. No extra-axial fluid collection. Gray-white matter differentiation is within normal limits. No hydrocephalus. Vascular: No hyperdense vessel or unexpected calcification. Skull: Normal. Negative for fracture or focal lesion. Sinuses/Orbits: Mucosal thickening is present in the ethmoid air cells and maxillary sinuses bilaterally. No acute orbital abnormality. Other: None. CT CERVICAL SPINE FINDINGS Alignment: Normal. Skull base and vertebrae: No acute fracture. No primary bone lesion or focal pathologic process. Soft tissues and spinal canal: No prevertebral fluid or swelling. No visible canal hematoma. Disc levels: Mild degenerative osteophyte formation is present at C5-C6. Upper chest: No acute abnormality. Other: A  radiopaque density is noted at the mandibular notch on the left, unchanged from previous exams and likely BB. IMPRESSION: 1. No acute intracranial process. 2. No acute fracture or subluxation in the cervical spine. Electronically Signed   By: Thornell Sartorius M.D.   On: 03/15/2023 01:57    Procedures Procedures    Medications Ordered in ED Medications - No data to display  ED Course/ Medical Decision Making/ A&P                             Medical Decision Making Amount and/or Complexity of Data Reviewed Labs: ordered. Radiology: ordered.   42 yo M with a chief complaints of fall and closed head injury.  This was reported by EMS.  The patient is clinically intoxicated.  He has no signs of trauma.  Will obtain a CT of the head and C-spine.  Patient is awake and alert and able to eat and drink  without issue.  Will discharge home.  PCP follow-up. CT head and c spine negative.   No significant anemia no significant electrolyte abnormality.  6:02 AM:  I have discussed the diagnosis/risks/treatment options with the patient.  Evaluation and diagnostic testing in the emergency department does not suggest an emergent condition requiring admission or immediate intervention beyond what has been performed at this time.  They will follow up with PCP. We also discussed returning to the ED immediately if new or worsening sx occur. We discussed the sx which are most concerning (e.g., sudden worsening pain, fever, inability to tolerate by mouth) that necessitate immediate return. Medications administered to the patient during their visit and any new prescriptions provided to the patient are listed below.  Medications given during this visit Medications - No data to display   The patient appears reasonably screen and/or stabilized for discharge and I doubt any other medical condition or other [facility] requiring further screening, evaluation, or treatment in the ED at this time prior to discharge.          Final Clinical Impression(s) / ED Diagnoses Final diagnoses:  Alcoholic intoxication without complication [facility])    Rx / DC Orders ED Discharge Orders     None         Melene Plan, DO 03/15/23 0602

## 2023-03-15 NOTE — ED Notes (Signed)
Pt has been dressed out in scrubs and belongings are in two bags located  in cabinet labeled "pt belongings 19-22, hall d".   Bag one: Shoes, socks, denture case with an earring and necklace in it,  Bag two: sweat pants, shirt, hat, phone.   Security has been called to wand the patient.

## 2023-03-15 NOTE — ED Triage Notes (Signed)
Patient brought in by EMS. EMS states patient was at a bar. States he only drank 2 beers and passed out in the chair twice. Patient states he fell and hit his head twice. Patient arrives in [place]. Complains of pain on the right side of his head. Patient was ambulatory on scene.

## 2023-03-15 NOTE — ED Notes (Signed)
Security has now wanded the patient.

## 2023-04-11 ENCOUNTER — Emergency Department (HOSPITAL_COMMUNITY)
Admission: EM | Admit: 2023-04-11 | Discharge: 2023-04-12 | Disposition: A | Discharge status: Home / Self Care | Payer: Medicaid Other | Attending: Emergency Medicine | Admitting: Emergency Medicine

## 2023-04-11 ENCOUNTER — Other Ambulatory Visit: Payer: Self-pay

## 2023-04-11 DIAGNOSIS — I509 Heart failure, unspecified: Secondary | ICD-10-CM | POA: Insufficient documentation

## 2023-04-11 DIAGNOSIS — Z765 Malingerer [conscious simulation]: Secondary | ICD-10-CM | POA: Diagnosis present

## 2023-04-11 DIAGNOSIS — Z21 Asymptomatic human immunodeficiency virus [HIV] infection status: Secondary | ICD-10-CM | POA: Insufficient documentation

## 2023-04-12 ENCOUNTER — Other Ambulatory Visit: Payer: Self-pay

## 2023-04-12 ENCOUNTER — Encounter (HOSPITAL_COMMUNITY): Payer: Self-pay

## 2023-04-12 LAB — COMPREHENSIVE METABOLIC PANEL
ALT: 34 U/L (ref 0–44)
AST: 45 U/L — ABNORMAL HIGH (ref 15–41)
Albumin: 3.5 g/dL (ref 3.5–5.0)
Alkaline Phosphatase: 48 U/L (ref 38–126)
Anion gap: 13 (ref 5–15)
BUN: 11 mg/dL (ref 6–20)
CO2: 19 mmol/L — ABNORMAL LOW (ref 22–32)
Calcium: 8.7 mg/dL — ABNORMAL LOW (ref 8.9–10.3)
Chloride: 107 mmol/L (ref 98–111)
Creatinine, Ser: 1.04 mg/dL (ref 0.61–1.24)
GFR, Estimated: >60 mL/min (ref >60)
Glucose, Bld: 78 mg/dL (ref 70–99)
Potassium: 3.4 mmol/L — ABNORMAL LOW (ref 3.5–5.1)
Sodium: 139 mmol/L (ref 135–145)
Total Bilirubin: 0.5 mg/dL (ref 0.3–1.2)
Total Protein: 8.6 g/dL — ABNORMAL HIGH (ref 6.5–8.1)

## 2023-04-12 LAB — RAPID URINE DRUG SCREEN, HOSP PERFORMED
Amphetamines: NOT DETECTED
Barbiturates: NOT DETECTED
Benzodiazepines: NOT DETECTED
Cocaine: NOT DETECTED
Opiates: NOT DETECTED
Tetrahydrocannabinol: NOT DETECTED

## 2023-04-12 LAB — SALICYLATE LEVEL: Salicylate Lvl: <7 mg/dL — ABNORMAL LOW (ref 7.0–30.0)

## 2023-04-12 LAB — CBC
HCT: 40.7 % (ref 39.0–52.0)
Hemoglobin: 13.6 g/dL (ref 13.0–17.0)
MCH: 31.1 pg (ref 26.0–34.0)
MCHC: 33.4 g/dL (ref 30.0–36.0)
MCV: 93.1 fL (ref 80.0–100.0)
Platelets: 282 10*3/uL (ref 150–400)
RBC: 4.37 MIL/uL (ref 4.22–5.81)
RDW: 14.5 % (ref 11.5–15.5)
WBC: 3.5 10*3/uL — ABNORMAL LOW (ref 4.0–10.5)
nRBC: 0 % (ref 0.0–0.2)

## 2023-04-12 LAB — ETHANOL: Alcohol, Ethyl (B): 182 mg/dL — ABNORMAL HIGH (ref <10)

## 2023-04-12 LAB — ACETAMINOPHEN LEVEL: Acetaminophen (Tylenol), Serum: <10 ug/mL — ABNORMAL LOW (ref 10–30)

## 2023-04-12 NOTE — ED Triage Notes (Signed)
Pt states that he is suicidal and will kill himself by any means

## 2023-04-12 NOTE — ED Provider Notes (Signed)
[place] EMERGENCY DEPARTMENT AT [facility] Provider Note   CSN: [id number] Arrival date & time: 04/11/23  2340     History  Chief Complaint  Patient presents with   Suicidal    William Powers is a 42 y.o. male with history of homelessness who presents reporting to his evaluated in triage.  I had my evaluation he slept for 6 hours in the emergency department.  He states at this time to this provider that he is not truly suicidal but was just reporting this so he would have a safe place to sleep for the night.  He denies any suicidality, homicidality, or AVH at this time.  Denies any acute medical concerns this evening, states he was only looking for a place to rest and something to eat.  I personally reviewed his medical records.  History of CHF, HIV and polysubstance use as well as homelessness.  Not currently on any medications daily. HPI     Home Medications Prior to Admission medications   Medication Sig Start Date End Date Taking? Authorizing Provider  amoxicillin (AMOXIL) 500 MG capsule Take 1 capsule (500 mg total) by mouth 3 (three) times daily. 02/21/23   Roxy Horseman, PA-C  Potassium Chloride ER 20 MEQ TBCR Take 20 mEq by mouth daily at 6 (six) AM. Patient not taking: No sig reported 08/07/21 09/05/21  Bobette Mo, MD      Allergies    Bee venom, Zestril [lisinopril], and Bactrim [sulfamethoxazole-trimethoprim]    Review of Systems   Review of Systems  Respiratory: Negative.    Cardiovascular: Negative.   Gastrointestinal: Negative.   Musculoskeletal: Negative.   Psychiatric/Behavioral: Negative.      Physical Exam Updated Vital Signs BP 115/61 (BP Location: Right Arm)   Pulse 84   Temp 98 F (36.7 C)   Resp 18   SpO2 98%  Physical Exam Vitals and nursing note reviewed.  Constitutional:      Appearance: He is not ill-appearing or toxic-appearing.  HENT:     Head: Normocephalic and atraumatic.  Eyes:     General: No scleral  icterus.       Right eye: No discharge.        Left eye: No discharge.     Conjunctiva/sclera: Conjunctivae normal.  Cardiovascular:     Heart sounds: Normal heart sounds. No murmur heard. Pulmonary:     Effort: Pulmonary effort is normal.  Skin:    General: Skin is warm and dry.     Capillary Refill: Capillary refill takes less than 2 seconds.  Neurological:     General: No focal deficit present.     Mental Status: He is alert.  Psychiatric:        Attention and Perception: Attention normal.        Mood and Affect: Mood normal.        Speech: Speech normal.        Behavior: Behavior normal.        Thought Content: Thought content does not include homicidal or suicidal ideation.     Comments: No AVH.     ED Results / Procedures / Treatments   Labs (all labs ordered are listed, but only abnormal results are displayed) Labs Reviewed  COMPREHENSIVE METABOLIC PANEL - Abnormal; Notable for the following components:      Result Value   Potassium 3.4 (*)    CO2 19 (*)    Calcium 8.7 (*)    Total Protein 8.6 (*)  AST 45 (*)    All other components within normal limits  ETHANOL - Abnormal; Notable for the following components:   Alcohol, Ethyl (B) 182 (*)    All other components within normal limits  SALICYLATE LEVEL - Abnormal; Notable for the following components:   Salicylate Lvl <7.0 (*)    All other components within normal limits  ACETAMINOPHEN LEVEL - Abnormal; Notable for the following components:   Acetaminophen (Tylenol), Serum <10 (*)    All other components within normal limits  CBC - Abnormal; Notable for the following components:   WBC 3.5 (*)    All other components within normal limits  RAPID URINE DRUG SCREEN, HOSP PERFORMED    EKG None  Radiology No results found.  Procedures Procedures    Medications Ordered in ED Medications - No data to display  ED Course/ Medical Decision Making/ A&P                             Medical Decision  Making 42 year old male presents to the emergency department reporting suicidality.  Vital signs reassuring on intake.  Cardiopulmonary abdominal failure running.  Patient adamantly denying any suicidality at this time stating he was only saying that he could have a place to sleep.  Amount and/or Complexity of Data Reviewed Labs: ordered.    Clinical picture most consistent with malingering for secondary gain of place to stay.  No acute medical concern this time.  No further workup warranted in the ER.  This chart was dictated using voice recognition software, Dragon. Despite the best efforts of this provider to proofread and correct errors, errors may still occur which can change documentation meaning.   Final Clinical Impression(s) / ED Diagnoses Final diagnoses:  Malingering    Rx / DC Orders ED Discharge Orders     None         Sherrilee Gilles 04/12/23 0649    Melene Plan, DO 04/12/23 [phone]

## 2023-04-19 ENCOUNTER — Emergency Department (HOSPITAL_COMMUNITY)
Admission: EM | Admit: 2023-04-19 | Discharge: 2023-04-19 | Discharge status: Against Medical Advice | Payer: Medicaid Other | Attending: Emergency Medicine | Admitting: Emergency Medicine

## 2023-04-19 DIAGNOSIS — R4689 Other symptoms and signs involving appearance and behavior: Secondary | ICD-10-CM | POA: Insufficient documentation

## 2023-04-19 DIAGNOSIS — Z5321 Procedure and treatment not carried out due to patient leaving prior to being seen by health care provider: Secondary | ICD-10-CM | POA: Diagnosis not present

## 2023-04-19 DIAGNOSIS — R45851 Suicidal ideations: Secondary | ICD-10-CM | POA: Diagnosis not present

## 2023-04-19 NOTE — ED Notes (Signed)
Patient called multiple times for triage.  No answer.

## 2023-05-01 ENCOUNTER — Encounter: Payer: Self-pay | Admitting: Nurse Practitioner

## 2023-05-01 ENCOUNTER — Other Ambulatory Visit: Payer: Self-pay

## 2023-05-01 ENCOUNTER — Emergency Department (HOSPITAL_COMMUNITY)
Admission: EM | Admit: 2023-05-01 | Discharge: 2023-05-01 | Disposition: A | Discharge status: Other Acute Inpatient Hospital | Payer: MEDICAID | Attending: Emergency Medicine | Admitting: Emergency Medicine

## 2023-05-01 ENCOUNTER — Inpatient Hospital Stay
Admission: AD | Admit: 2023-05-01 | Discharge: 2023-05-03 | DRG: 885 | Disposition: A | Discharge status: Home / Self Care | Payer: MEDICAID | Source: Ambulatory Visit | Attending: Psychiatry | Admitting: Psychiatry

## 2023-05-01 DIAGNOSIS — F1090 Alcohol use, unspecified, uncomplicated: Secondary | ICD-10-CM | POA: Diagnosis not present

## 2023-05-01 DIAGNOSIS — Z86711 Personal history of pulmonary embolism: Secondary | ICD-10-CM | POA: Diagnosis not present

## 2023-05-01 DIAGNOSIS — I5022 Chronic systolic (congestive) heart failure: Secondary | ICD-10-CM | POA: Diagnosis present

## 2023-05-01 DIAGNOSIS — F17210 Nicotine dependence, cigarettes, uncomplicated: Secondary | ICD-10-CM | POA: Diagnosis present

## 2023-05-01 DIAGNOSIS — Z21 Asymptomatic human immunodeficiency virus [HIV] infection status: Secondary | ICD-10-CM | POA: Diagnosis present

## 2023-05-01 DIAGNOSIS — F332 Major depressive disorder, recurrent severe without psychotic features: Secondary | ICD-10-CM | POA: Diagnosis not present

## 2023-05-01 DIAGNOSIS — R45851 Suicidal ideations: Secondary | ICD-10-CM | POA: Diagnosis present

## 2023-05-01 DIAGNOSIS — I110 Hypertensive heart disease with heart failure: Secondary | ICD-10-CM | POA: Diagnosis present

## 2023-05-01 LAB — CBC WITH DIFFERENTIAL/PLATELET
Abs Immature Granulocytes: 0.01 10*3/uL (ref 0.00–0.07)
Basophils Absolute: 0 10*3/uL (ref 0.0–0.1)
Basophils Relative: 1 %
Eosinophils Absolute: 0.1 10*3/uL (ref 0.0–0.5)
Eosinophils Relative: 4 %
HCT: 40.1 % (ref 39.0–52.0)
Hemoglobin: 13.1 g/dL (ref 13.0–17.0)
Immature Granulocytes: 0 %
Lymphocytes Relative: 32 %
Lymphs Abs: 1 10*3/uL (ref 0.7–4.0)
MCH: 31.3 pg (ref 26.0–34.0)
MCHC: 32.7 g/dL (ref 30.0–36.0)
MCV: 95.7 fL (ref 80.0–100.0)
Monocytes Absolute: 0.3 10*3/uL (ref 0.1–1.0)
Monocytes Relative: 10 %
Neutro Abs: 1.7 10*3/uL (ref 1.7–7.7)
Neutrophils Relative %: 53 %
Platelets: 292 10*3/uL (ref 150–400)
RBC: 4.19 MIL/uL — ABNORMAL LOW (ref 4.22–5.81)
RDW: 14.1 % (ref 11.5–15.5)
WBC: 3.2 10*3/uL — ABNORMAL LOW (ref 4.0–10.5)
nRBC: 0 % (ref 0.0–0.2)

## 2023-05-01 LAB — COMPREHENSIVE METABOLIC PANEL
ALT: 33 U/L (ref 0–44)
AST: 40 U/L (ref 15–41)
Albumin: 3.4 g/dL — ABNORMAL LOW (ref 3.5–5.0)
Alkaline Phosphatase: 50 U/L (ref 38–126)
Anion gap: 12 (ref 5–15)
BUN: 12 mg/dL (ref 6–20)
CO2: 21 mmol/L — ABNORMAL LOW (ref 22–32)
Calcium: 8.5 mg/dL — ABNORMAL LOW (ref 8.9–10.3)
Chloride: 105 mmol/L (ref 98–111)
Creatinine, Ser: 1 mg/dL (ref 0.61–1.24)
GFR, Estimated: >60 mL/min (ref >60)
Glucose, Bld: 85 mg/dL (ref 70–99)
Potassium: 3.5 mmol/L (ref 3.5–5.1)
Sodium: 138 mmol/L (ref 135–145)
Total Bilirubin: 0.7 mg/dL (ref 0.3–1.2)
Total Protein: 8.3 g/dL — ABNORMAL HIGH (ref 6.5–8.1)

## 2023-05-01 LAB — RAPID URINE DRUG SCREEN, HOSP PERFORMED
Amphetamines: NOT DETECTED
Barbiturates: NOT DETECTED
Benzodiazepines: NOT DETECTED
Cocaine: NOT DETECTED
Opiates: NOT DETECTED
Tetrahydrocannabinol: NOT DETECTED

## 2023-05-01 LAB — ETHANOL: Alcohol, Ethyl (B): 166 mg/dL — ABNORMAL HIGH (ref <10)

## 2023-05-01 MED ORDER — ALUM & MAG HYDROXIDE-SIMETH 200-200-20 MG/5ML PO SUSP: 30.0000 mL | ORAL | Status: DC | PRN

## 2023-05-01 MED ORDER — HALOPERIDOL 5 MG PO TABS: 5.0000 mg | ORAL_TABLET | Freq: Three times a day (TID) | ORAL | Status: DC | PRN

## 2023-05-01 MED ORDER — HALOPERIDOL LACTATE 5 MG/ML IJ SOLN: 5.0000 mg | Freq: Three times a day (TID) | INTRAMUSCULAR | Status: DC | PRN

## 2023-05-01 MED ORDER — DIPHENHYDRAMINE HCL 25 MG PO CAPS: 50.0000 mg | ORAL_CAPSULE | Freq: Three times a day (TID) | ORAL | Status: DC | PRN

## 2023-05-01 MED ORDER — HYDROXYZINE HCL 25 MG PO TABS: 25.0000 mg | ORAL_TABLET | Freq: Three times a day (TID) | ORAL | Status: DC | PRN

## 2023-05-01 MED ORDER — MAGNESIUM HYDROXIDE 400 MG/5ML PO SUSP: 30.0000 mL | Freq: Every day | ORAL | Status: DC | PRN

## 2023-05-01 MED ORDER — DIPHENHYDRAMINE HCL 50 MG/ML IJ SOLN: 50.0000 mg | Freq: Three times a day (TID) | INTRAMUSCULAR | Status: DC | PRN

## 2023-05-01 MED ORDER — LORAZEPAM 2 MG PO TABS: 2.0000 mg | ORAL_TABLET | Freq: Three times a day (TID) | ORAL | Status: DC | PRN

## 2023-05-01 MED ORDER — LORAZEPAM 2 MG/ML IJ SOLN: 2.0000 mg | Freq: Three times a day (TID) | INTRAMUSCULAR | Status: DC | PRN

## 2023-05-01 MED ORDER — ACETAMINOPHEN 325 MG PO TABS: 650.0000 mg | ORAL_TABLET | Freq: Four times a day (QID) | ORAL | Status: DC | PRN

## 2023-05-01 MED ORDER — TRAZODONE HCL 50 MG PO TABS
50.0000 mg | ORAL_TABLET | Freq: Every evening | ORAL | Status: DC | PRN
  Administered 2023-05-02: 50 mg via ORAL
  Filled 2023-05-01: qty 1

## 2023-05-01 NOTE — Tx Team (Signed)
Initial Treatment Plan 05/01/2023 11:28 AM [name] QIO:[id number]    PATIENT STRESSORS: Educational concerns   Substance abuse     PATIENT STRENGTHS: Ability for insight  Active sense of humor  Capable of independent living  Communication skills  Supportive family/friends    PATIENT IDENTIFIED PROBLEMS: Pt identifies alcohol being a huge factor in his depressive episodes. He wants to get clean from alcohol and start some medications for depression.                     DISCHARGE CRITERIA:  Ability to meet basic life and health needs Adequate post-discharge living arrangements  PRELIMINARY DISCHARGE PLAN: Return to previous living arrangement Return to previous work or school arrangements  PATIENT/FAMILY INVOLVEMENT: This treatment plan has been presented to and reviewed with the patient, William Powers-  The patient and family have been given the opportunity to ask questions and make suggestions.  William Blanc, RN 05/01/2023, 11:28 AM

## 2023-05-01 NOTE — ED Notes (Signed)
Faxed volentary consent over to [phone]

## 2023-05-01 NOTE — ED Notes (Signed)
All needed paperwork printed & in folder, report called to [facility] to Harless Litten, Charity fundraiser, belongings gathered from locker & security & given to [employer] driver & pt is being transported to [facility] at this time.

## 2023-05-01 NOTE — Group Note (Signed)
Recreation Therapy Group Note   Group Topic:Leisure Education  Group Date: 05/01/2023 Start Time: 1000 End Time: 1100 Facilitators: Rosina Lowenstein, LRT, CTRS Location: Craft Room  Group Description: Leisure. Patients were given the option to choose from coloring, singing karaoke, making origami, journaling or listening to music. LRT and pts discussed the meaning of leisure, the importance of participating in leisure during their free time/when they're outside of the hospital, as well as how our leisure interests can also serve as coping skills. Pt identified two leisure interests and shared with the group.   Goal Area(s) Addressed:  Patient will identify a current leisure interest.  Patient will learn the definition of "leisure". Patient will practice making a positive decision. Patient will have the opportunity to try a new leisure activity. Patient will communicate with peers and LRT.     Affect/Mood: N/A   Participation Level: Did not attend    Clinical Observations/Individualized Feedback: Aalim did not attend group yet due to not being on the unit yet.   Plan: Continue to engage patient in RT group sessions 2-3x/week.   Rosina Lowenstein, LRT, CTRS 05/01/2023 11:30 AM

## 2023-05-01 NOTE — BH Assessment (Addendum)
Comprehensive Clinical Assessment (CCA) Note  05/01/2023 William Powers [id number] Disposition: Patient care discussed with Rockney Ghee, NP.  She recommends inpatient psychiatric care for patient.  Clinician informed PA Barrie Dunker and RN Beckie Busing of disposition via secure messaging.  Patient has fair eye contact.  He is not oriented to day/date.  Patient reports hearing voices and seing shadows.  Patient speaks in a clear tone but does not elaborate.  He has poor judgement.  Pt reports drinking daily.  Pt has no current outpatient providers.     Chief Complaint:  Chief Complaint  Patient presents with   Suicidal   Visit Diagnosis: MDD recurrent, severe; ETOH use d/o severe    CCA Screening, Triage and Referral (STR)  Patient Reported Information How did you hear about [place]? Legal System (Pt called the police due to his suicidal thoughts.)  What Is the Reason for Your Visit/Call Today? Pt says he would kill himself "any way possible."  He has had seven previous attempts.  Earlier he told staff that he was going to try to swallow a stick to kill himself, ostensibly choke himself.  Patient has no HI.  He does hear voices telling him to kill himself.  Patient also sees "sillouettes of people not there."  Pt has no access to guns.  He has been staying iwth his aunt fo rthe last week.  Patient says he drinks up to a gallon of liquor a day.  Pt says he has no outpatient psychiatric care.  How Long Has This Been Causing You Problems? 1 wk - 1 month  What Do You Feel Would Help You the Most Today? Treatment for Depression or other mood problem; Alcohol or Drug Use Treatment   Have You Recently Had Any Thoughts About Hurting Yourself? Yes  Are You Planning to Commit Suicide/Harm Yourself At This time? Yes (Pl plans to choke on a stick.)   Flowsheet Row ED from 05/01/2023 in [facility] Emergency Department at [facility] ED from 04/11/2023 in [facility] Emergency  Department at [facility] ED from 03/15/2023 in [facility] Emergency Department at [facility]  C-SSRS RISK CATEGORY High Risk High Risk High Risk       Have you Recently Had Thoughts About Hurting Someone [name]? No  Are You Planning to Harm Someone at This Time? No  Explanation: Pt has plan to swallow a stick to kill himself.  No HI.   Have You Used Any Alcohol or Drugs in the Past 24 Hours? Yes  What Did You Use and How Much? Patient reports drinking up to a gallon of liquor a day.  His BAL was 166 at 01:20.   Do You Currently Have a Therapist/Psychiatrist? No  Name of Therapist/Psychiatrist: Name of Therapist/Psychiatrist: None   Have You Been Recently Discharged From Any Office Practice or Programs? No  Explanation of Discharge From Practice/Program: No recent discharges     CCA Screening Triage Referral Assessment Type of Contact: Tele-Assessment  Telemedicine Service Delivery:   Is this Initial or Reassessment? Is this Initial or Reassessment?: Initial Assessment  Date Telepsych consult ordered in [facility]:  Date Telepsych consult ordered in [facility]: 05/01/23  Time Telepsych consult ordered in [facility]:  Time Telepsych consult ordered in [facility]: 0050  Location of Assessment: [facility] ED  Provider Location: [facility] Assessment Services   Collateral Involvement: None available   Does Patient Have a Automotive engineer Guardian? No  Legal Guardian Contact Information: Patient has no  legal guardian  Copy of Legal Guardianship Form: -- (Patient has no legal guardian)  Legal Guardian Notified of Arrival: -- (Patient has no legal guardian)  Legal Guardian Notified of Pending Discharge: -- (Patient has no legal guardian)  If Minor and Not Living with Parent(s), Who has Custody? Pt is an adult  Is CPS involved or ever been involved? Never  Is APS involved or ever been involved? Never   Patient Determined To Be At Risk for Harm To Self or Others Based on Review of  Patient Reported Information or Presenting Complaint? Yes, for Self-Harm  Method: Plan without intent (Pt has plan to choke on a stick to kill himself.)  Availability of Means: Has close by (Pt could pick up a stick in any wooded area.)  Intent: -- (Pt professes intent to kill himself.)  Notification Required: No need or identified person  Additional Information for Danger to Others Potential: Previous attempts (Pt has attempted suicide seven times before.)  Additional Comments for Danger to Others Potential: Pt denies history of violence  Are There Guns or Other Weapons in Your Home? No  Types of Guns/Weapons: Pt denies access to guns/weapons  Are These Weapons Safely Secured?                            No  Who Could Verify You Are Able To Have These Secured: Denies access to guns/weapons  Do You Have any Outstanding Charges, Pending Court Dates, Parole/Probation? Denies any charges  Contacted To Inform of Risk of Harm To Self or Others: Patent examiner (Patient had contacted law enforcement regarding SI.)    Does Patient Present under Involuntary Commitment? Yes    [place] of Residence: [place] (Staying with famiy in [place])   Patient Currently Receiving the Following Services: Not Receiving Services   Determination of Need: Urgent (48 hours)   Options For Referral: Inpatient Hospitalization     CCA Biopsychosocial Patient Reported Schizophrenia/Schizoaffective Diagnosis in Past: No   Strengths: Pt is motivated for treatment   Mental Health Symptoms Depression:   Change in energy/activity; Difficulty Concentrating; Fatigue; Hopelessness; Worthlessness; Tearfulness; Sleep (too much or little); Irritability   Duration of Depressive symptoms:  Duration of Depressive Symptoms: Greater than two weeks   Mania:   None   Anxiety:    Difficulty concentrating; Fatigue; Irritability; Restlessness; Sleep; Tension; Worrying   Psychosis:   Hallucinations    Duration of Psychotic symptoms:  Duration of Psychotic Symptoms: Greater than six months   Trauma:   None   Obsessions:   None   Compulsions:   None   Inattention:   N/A   Hyperactivity/Impulsivity:   None   Oppositional/Defiant Behaviors:   None   Emotional Irregularity:   Recurrent suicidal behaviors/gestures/threats   Other Mood/Personality Symptoms:   Depression    Mental Status Exam Appearance and self-care  Stature:   Tall   Weight:   Average weight   Clothing:   Casual (In scrubs)   Grooming:   Neglected   Cosmetic use:   None   Posture/gait:   Normal (Pt laying prone on the bed.)   Motor activity:   Not Remarkable   Sensorium  Attention:   Normal   Concentration:   Normal   Orientation:   Situation; Place; Person; Object   Recall/memory:   Normal   Affect and Mood  Affect:   Congruent   Mood:   Depressed   Relating  Eye contact:  Normal   Facial expression:   Responsive   Attitude toward examiner:   Cooperative   Thought and Language  Speech flow:  Normal   Thought content:   Appropriate to Mood and Circumstances   Preoccupation:   None   Hallucinations:   Auditory; Visual   Organization:   Patent examiner of Knowledge:   Fair   Intelligence:   Average   Abstraction:   Normal   Judgement:   Poor   Reality Testing:   Adequate   Insight:   Shallow; Poor   Decision Making:   Impulsive   Social Functioning  Social Maturity:   Isolates   Social Judgement:   "Street Smart"   Stress  Stressors:   Housing; Office manager Ability:   Overwhelmed; Deficient supports   Skill Deficits:   Decision making; Responsibility   Supports:   Support needed     Religion: Religion/Spirituality Are You A Religious Person?: No What is Your Religious Affiliation?: Christian How Might This Affect Treatment?: No affect on treatment  Leisure/Recreation: Leisure /  Recreation Do You Have Hobbies?: No  Exercise/Diet: Exercise/Diet Do You Exercise?: No Have You Gained or Lost A Significant Amount of Weight in the Past Six Months?: No Do You Follow a Special Diet?: No Do You Have Any Trouble Sleeping?: No Explanation of Sleeping Difficulties: No problem with sleep   CCA Employment/Education Employment/Work Situation: Employment / Work Situation Employment Situation: On disability Why is Patient on Disability: Heart failure. How Long has Patient Been on Disability: Since 46. Patient's Job has Been Impacted by Current Illness: No Has Patient ever Been in the [employer]?: No  Education: Education Is Patient Currently Attending School?: No Last Grade Completed: 10 Did You Attend College?: No Did You Have An Individualized Education Program (IIEP): No Did You Have Any Difficulty At School?: No Patient's Education Has Been Impacted by Current Illness: No   CCA Family/Childhood History Family and Relationship History: Family history Marital status: Divorced Divorced, when?: Per chart, pt was married for 3 months and then divorced What types of issues is patient dealing with in the relationship?: unsure Additional relationship information: n/a Does patient have children?: No  Childhood History:  Childhood History By whom was/is the patient raised?: Mother Did patient suffer any verbal/emotional/physical/sexual abuse as a child?: No Did patient suffer from severe childhood neglect?: No Has patient ever been sexually abused/assaulted/raped as an adolescent or adult?: No Was the patient ever a victim of a crime or a disaster?: No Witnessed domestic violence?: No Has patient been affected by domestic violence as an adult?: No       CCA Substance Use Alcohol/Drug Use: Alcohol / Drug Use Pain Medications: See MAR Prescriptions: See MAR Over the Counter: See MAR History of alcohol / drug use?: Yes Longest period of sobriety (when/how  long): Unsure Negative Consequences of Use: Personal relationships, Financial Withdrawal Symptoms: None Substance #1 Name of Substance 1: ETOH 1 - Age of First Use: 42 years of age 7 - Amount (size/oz): Between a pint and up to a gallon 1 - Frequency: Daily 1 - Duration: Ongoing 1 - Last Use / Amount: 04/30/23 1 - Method of Aquiring: Legal purchase 1- Route of Use: oral                       ASAM's:  Six Dimensions of Multidimensional Assessment  Dimension 1:  Acute Intoxication and/or Withdrawal Potential:   Dimension  1:  Description of individual's past and current experiences of substance use and withdrawal: Pt reports drinking alcohol almost daily  Dimension 2:  Biomedical Conditions and Complications:   Dimension 2:  Description of patient's biomedical conditions and  complications: Pt has the following diagnoses: History of CHF (congestive heart failure), HIV (human immunodeficiency virus infection) (HCC), HSV-2 (herpes simplex virus 2) infection. Pt reports, he's not currently seeing a Cardiologist regularly but on a as needed basis.  Dimension 3:  Emotional, Behavioral, or Cognitive Conditions and Complications:  Dimension 3:  Description of emotional, behavioral, or cognitive conditions and complications: Pt has the following diagnoses: Alcohol use disorder, severe, dependence (HCC), Recurrent major depressive episodes, moderate (HCC), Adjustment disorder with depressed mood.  Dimension 4:  Readiness to Change:  Dimension 4:  Description of Readiness to Change criteria: During the assessment pt did not disclose wanting to stop using substances.  Dimension 5:  Relapse, Continued use, or Continued Problem Potential:  Dimension 5:  Relapse, continued use, or continued problem potential critiera description: Pt has ongoing substance use.  Dimension 6:  Recovery/Living Environment:  Dimension 6:  Recovery/Iiving environment criteria description: Pt is chronically homeless  ASAM  Severity Score: ASAM's Severity Rating Score: 12  ASAM Recommended Level of Treatment: ASAM Recommended Level of Treatment: Level III Residential Treatment   Substance use Disorder (SUD) Substance Use Disorder (SUD)  Checklist Symptoms of Substance Use: Continued use despite having a persistent/recurrent physical/psychological problem caused/exacerbated by use, Continued use despite persistent or recurrent social, interpersonal problems, caused or exacerbated by use, Evidence of tolerance, Substance(s) often taken in larger amounts or over longer times than was intended, Presence of craving or strong urge to use, Persistent desire or unsuccessful efforts to cut down or control use  Recommendations for Services/Supports/Treatments:    Discharge Disposition:    DSM5 Diagnoses: Patient Active Problem List   Diagnosis Date Noted   Dental caries 09/13/2022   Substance abuse (HCC) 05/03/2022   Polysubstance abuse (HCC) 04/29/2022   Alcohol use disorder, severe, dependence (HCC) 03/16/2022   Recurrent major depressive episodes, moderate (HCC) 02/23/2022   Alcohol abuse    Suicidal ideation    Vitamin B1 deficiency 09/19/2021   Adjustment disorder with depressed mood    History of CHF (congestive heart failure) 08/07/2021   H/O ETOH abuse 08/07/2021   Chronic HFrEF (heart failure with reduced ejection fraction) (HCC) 07/23/2021   HIV (human immunodeficiency virus infection) (HCC) 07/23/2021   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   Tinea versicolor 09/17/2018   History of tobacco abuse 09/16/2018   Dilated cardiomyopathy (HCC) 09/13/2018     Referrals to Alternative Service(s): Referred to Alternative Service(s):   Place:   Date:   Time:    Referred to Alternative Service(s):   Place:   Date:   Time:    Referred to Alternative Service(s):   Place:   Date:   Time:    Referred to Alternative Service(s):   Place:   Date:   Time:     Wandra Mannan

## 2023-05-01 NOTE — ED Notes (Signed)
Patient signed Voluntary paperwork and being faxed now.

## 2023-05-01 NOTE — Group Note (Signed)
Date:  05/01/2023 Time:  6:55 PM  Group Topic/Focus:  Building Self Esteem:   The Focus of this group is helping patients become aware of the effects of self-esteem on their lives, the things they and others do that enhance or undermine their self-esteem, seeing the relationship between their level of self-esteem and the choices they make and learning ways to enhance self-esteem.    Participation Level:  Did Not Attend   [name] 05/01/2023, 6:55 PM

## 2023-05-01 NOTE — Plan of Care (Signed)
  Problem: Activity: Goal: Interest or engagement in leisure activities will improve Outcome: Progressing Goal: Imbalance in normal sleep/wake cycle will improve Outcome: Progressing   Problem: Coping: Goal: Coping ability will improve Outcome: Progressing Goal: Will verbalize feelings Outcome: Progressing   

## 2023-05-01 NOTE — Group Note (Signed)
Date:  05/01/2023 Time:  8:56 PM  Group Topic/Focus:  Wrap-Up Group:   The focus of this group is to help patients review their daily goal of treatment and discuss progress on daily workbooks.    Participation Level:  Did Not Attend   [name] 05/01/2023, 8:56 PM

## 2023-05-01 NOTE — ED Triage Notes (Signed)
Patient reports suicidal ideation plans to shoot himself.

## 2023-05-01 NOTE — ED Notes (Signed)
TTS being done now 

## 2023-05-01 NOTE — ED Notes (Signed)
Patient states he drank bleach 2 days ago trying to kill himself. Report he has been having suicidal thoughts for the last 2 days. Reports wants to swallow a stick and kill himself. Denies having access to firearms at home. Reports lives with his aunt. Pt calm and cooperative at this time. Provided with warm blanket, resting in bed with eyes closed.

## 2023-05-01 NOTE — Progress Notes (Signed)
Patient was admitted to BMU to 324, per ED report he came into the ED intoxicated and his BAL was 166 upon arrival. Patient is alert and oriented x4. He endorses his reason for being voluntarily admitted is detox from alcohol and to get some medications for depression. Denies current SI/HI/AVH. Pt oriented to unit and contracts for safety. No concerns at this time.

## 2023-05-01 NOTE — Group Note (Signed)
Date:  05/01/2023 Time:  6:50 PM  Group Topic/Focus:  Activity Group:  The focus of the group is to encourage patients to go outside to the courtyard to get some fresh air for their physical and mental wellbeing.    Participation Level:  Did Not Attend   Mary Sella Lenford Beddow 05/01/2023, 6:50 PM

## 2023-05-01 NOTE — ED Provider Notes (Signed)
[facility] AT [facility] Provider Note   CSN: [id number] Arrival date & time: 05/01/23  0013     History  Chief Complaint  Patient presents with   Suicidal    William Powers is a 42 y.o. male.  Patient presents to the emergency department complaining of suicidal ideation.  Patient states he tried to hurt himself by swallowing a steak in the woods earlier.  He also states he plans to shoot himself.  He does endorse a voice which is told him to kill himself, also endorses seeing 3 silhouettes/people which are not reportedly there.  He denies HI at this time.  Patient denies other medical complaints at this time.  He has a history of homelessness but states he currently has housing.  Medical history significant for chronic heart failure with reduced ejection fraction, HIV, history of alcohol abuse, history of tobacco abuse, adjustment disorder with depressed mood, recurrent major depressive episodes, suicidal ideation, polysubstance abuse  HPI     Home Medications Prior to Admission medications   Medication Sig Start Date End Date Taking? Authorizing Provider  amoxicillin (AMOXIL) 500 MG capsule Take 1 capsule (500 mg total) by mouth 3 (three) times daily. 02/21/23   Roxy Horseman, PA-C  Potassium Chloride ER 20 MEQ TBCR Take 20 mEq by mouth daily at 6 (six) AM. Patient not taking: No sig reported 08/07/21 09/05/21  Bobette Mo, MD      Allergies    Bee venom, Zestril [lisinopril], and Bactrim [sulfamethoxazole-trimethoprim]    Review of Systems   Review of Systems  Physical Exam Updated Vital Signs BP 102/79 (BP Location: Right Arm)   Pulse (!) 109   Temp 98 F (36.7 C)   Resp 18   SpO2 100%  Physical Exam Vitals and nursing note reviewed.  HENT:     Head: Normocephalic and atraumatic.  Eyes:     Pupils: Pupils are equal, round, and reactive to light.  Pulmonary:     Effort: Pulmonary effort is normal. No respiratory  distress.  Musculoskeletal:        General: No signs of injury.     Cervical back: Normal range of motion.  Skin:    General: Skin is dry.  Neurological:     Mental Status: He is alert.  Psychiatric:        Speech: Speech normal.        Behavior: Behavior normal.     ED Results / Procedures / Treatments   Labs (all labs ordered are listed, but only abnormal results are displayed) Labs Reviewed  COMPREHENSIVE METABOLIC PANEL - Abnormal; Notable for the following components:      Result Value   CO2 21 (*)    Calcium 8.5 (*)    Total Protein 8.3 (*)    Albumin 3.4 (*)    All other components within normal limits  ETHANOL - Abnormal; Notable for the following components:   Alcohol, Ethyl (B) 166 (*)    All other components within normal limits  CBC WITH DIFFERENTIAL/PLATELET - Abnormal; Notable for the following components:   WBC 3.2 (*)    RBC 4.19 (*)    All other components within normal limits  RAPID URINE DRUG SCREEN, HOSP PERFORMED    EKG EKG Interpretation Date/Time:  Friday May 01 2023 01:01:56 EDT Ventricular Rate:  96 PR Interval:  169 QRS Duration:  102 QT Interval:  368 QTC Calculation: 465 R Axis:   63  Text Interpretation:  Sinus rhythm ST elev, probable normal early repol pattern No significant change was found Confirmed by Drema Pry [phone]) on 05/01/2023 1:07:11 AM  Radiology No results found.  Procedures Procedures    Medications Ordered in ED Medications - No data to display  ED Course/ Medical Decision Making/ A&P                             Medical Decision Making Amount and/or Complexity of Data Reviewed Labs: ordered.   Patient presents to the emergency department with a chief complaint of suicidal ideation.  Medical clearance workup has been initiated.   I ordered and reviewed labs.  Ethanol 166.  Unremarkable CBC, CMP.  UDS unremarkable.   Patient is medically cleared at this time for TTS consult.  Behavioral health nurse  practitioner recommended inpatient psychiatric treatment for patient.  Patient to be admitted to [facility] behavioral health unit.  Patient is voluntary at this time.        Final Clinical Impression(s) / ED Diagnoses Final diagnoses:  Suicidal ideations    Rx / DC Orders ED Discharge Orders     None         Pamala Duffel 05/01/23 0515    Nira Conn, MD 05/01/23 [phone]

## 2023-05-02 DIAGNOSIS — F332 Major depressive disorder, recurrent severe without psychotic features: Principal | ICD-10-CM

## 2023-05-02 MED ORDER — LORAZEPAM 1 MG PO TABS: 1.0000 mg | ORAL_TABLET | Freq: Four times a day (QID) | ORAL | Status: DC | PRN

## 2023-05-02 MED ORDER — ONDANSETRON 4 MG PO TBDP: 4.0000 mg | ORAL_TABLET | Freq: Four times a day (QID) | ORAL | Status: DC | PRN

## 2023-05-02 MED ORDER — THIAMINE MONONITRATE 100 MG PO TABS
100.0000 mg | ORAL_TABLET | Freq: Every day | ORAL | Status: DC
  Administered 2023-05-03: 100 mg via ORAL
  Filled 2023-05-02: qty 1

## 2023-05-02 MED ORDER — ADULT MULTIVITAMIN W/MINERALS CH
1.0000 | ORAL_TABLET | Freq: Every day | ORAL | Status: DC
  Administered 2023-05-03: 1 via ORAL
  Filled 2023-05-02 (×2): qty 1

## 2023-05-02 MED ORDER — LOPERAMIDE HCL 2 MG PO CAPS: 2.0000 mg | ORAL_CAPSULE | ORAL | Status: DC | PRN

## 2023-05-02 MED ORDER — LORAZEPAM 1 MG PO TABS
1.0000 mg | ORAL_TABLET | Freq: Four times a day (QID) | ORAL | Status: AC
  Administered 2023-05-02 (×2): 1 mg via ORAL
  Filled 2023-05-02 (×3): qty 1

## 2023-05-02 MED ORDER — LORAZEPAM 1 MG PO TABS: 1.0000 mg | ORAL_TABLET | Freq: Two times a day (BID) | ORAL | Status: DC

## 2023-05-02 MED ORDER — HYDROXYZINE HCL 25 MG PO TABS: 25.0000 mg | ORAL_TABLET | Freq: Four times a day (QID) | ORAL | 0 refills | Status: DC | PRN

## 2023-05-02 MED ORDER — THIAMINE HCL 100 MG/ML IJ SOLN
100.0000 mg | Freq: Once | INTRAMUSCULAR | Status: DC
  Filled 2023-05-02: qty 2

## 2023-05-02 MED ORDER — HYDROXYZINE HCL 25 MG PO TABS: 25.0000 mg | ORAL_TABLET | Freq: Four times a day (QID) | ORAL | Status: DC | PRN

## 2023-05-02 MED ORDER — LORAZEPAM 1 MG PO TABS
1.0000 mg | ORAL_TABLET | Freq: Three times a day (TID) | ORAL | Status: DC
  Administered 2023-05-03: 1 mg via ORAL
  Filled 2023-05-02: qty 1

## 2023-05-02 MED ORDER — LORAZEPAM 1 MG PO TABS: 1.0000 mg | ORAL_TABLET | Freq: Every day | ORAL | Status: DC

## 2023-05-02 NOTE — Group Note (Signed)
Date:  05/02/2023 Time:  9:55 PM  Group Topic/Focus:  Making Healthy Choices:   The focus of this group is to help patients identify negative/unhealthy choices they were using prior to admission and identify positive/healthier coping strategies to replace them upon discharge.    Participation Level:  Active  Participation Quality:  Appropriate  Affect:  Appropriate  Cognitive:  Appropriate  Insight: Good  Engagement in Group:  Engaged  Modes of Intervention:  Discussion  Additional Comments:   Lenore Cordia 05/02/2023, 9:55 PM

## 2023-05-02 NOTE — Group Note (Signed)
Date:  05/02/2023 Time:  3:29 PM  Group Topic/Focus:  Activity Group:  Focus of the group is to promote physical and mental wellbeing by encouraging the patients to come outside to the courtyard to get some fresh air.    Participation Level:  Did Not Attend   Mary Sella Lyrik Dockstader 05/02/2023, 3:29 PM

## 2023-05-02 NOTE — Group Note (Signed)
LCSW Group Therapy Note  Group Date: 05/02/2023 Start Time: 1300 End Time: 1345   Type of Therapy and Topic:  Group Therapy - Healthy vs Unhealthy Coping Skills  Participation Level:  Active   Description of Group The focus of this group was to determine what unhealthy coping techniques typically are used by group members and what healthy coping techniques would be helpful in coping with various problems. Patients were guided in becoming aware of the differences between healthy and unhealthy coping techniques. Patients were asked to identify 2-3 healthy coping skills they would like to learn to use more effectively.  Therapeutic Goals Patients learned that coping is what human beings do all day long to deal with various situations in their lives Patients defined and discussed healthy vs unhealthy coping techniques Patients identified their preferred coping techniques and identified whether these were healthy or unhealthy Patients determined 2-3 healthy coping skills they would like to become more familiar with and use more often. Patients provided support and ideas to each other   Summary of Patient Progress:  Patient attended group. During group, William Powers expressed 3 words to describe himself as: Open-minded, spontaneous, and smart. Patient proved open to input from peers and feedback from CSW. Patient demonstrated good insight into the subject matter, was respectful of peers, and participated throughout the entire session.   Therapeutic Modalities Cognitive Behavioral Therapy Motivational Interviewing  William Powers, [place] 05/02/2023  1:50 PM

## 2023-05-02 NOTE — Progress Notes (Signed)
Patient refused scheduled medications. Catha Nottingham, NP was notified in person and stated that she would talk to patient.

## 2023-05-02 NOTE — Progress Notes (Signed)
   05/02/23 1800  CIWA-Ar  Nausea and Vomiting 0  Tactile Disturbances 0  Tremor 0  Auditory Disturbances 0  Paroxysmal Sweats 0  Visual Disturbances 0  Anxiety 2  Headache, Fullness in Head 0  Agitation 0  Orientation and Clouding of Sensorium 0  CIWA-Ar Total 2

## 2023-05-02 NOTE — Group Note (Addendum)
Date:  05/02/2023 Time:  9:10 PM  Group Topic/Focus:  Making Healthy Choices:   The focus of this group is to help patients identify negative/unhealthy choices they were using prior to admission and identify positive/healthier coping strategies to replace them upon discharge.    Participation Level:  Active  Participation Quality:  Appropriate  Affect:  Appropriate  Cognitive:  Appropriate  Insight: Appropriate  Engagement in Group:  Engaged  Modes of Intervention:  Discussion  Additional Comments:    Lenore Cordia 05/02/2023, 9:10 PM

## 2023-05-02 NOTE — [facility] Suicide Risk Assessment (Signed)
[facility] Discharge Suicide Risk Assessment   Principal Problem: MDD (major depressive disorder), recurrent severe, without psychosis (HCC) Discharge Diagnoses: Principal Problem:   MDD (major depressive disorder), recurrent severe, without psychosis (HCC)  Musculoskeletal: Strength & Muscle Tone: within normal limits Gait & Station: normal Patient leans: N/A   Psychiatric Specialty Exam:   Presentation  General Appearance:  Casual   Eye Contact: Good   Speech: Clear and Coherent   Speech Volume: Normal   Handedness: Right     Mood and Affect  Mood: Good   Affect: Appropriate     Thought Process  Thought Processes: Coherent   Descriptions of Associations:Goal directed   Orientation:Full (Time, Place and Person)   Thought Content: Improved   History of Schizophrenia/Schizoaffective disorder:No   Hallucinations:Denies Ideas of Reference:None   Suicidal Thoughts:Denies Homicidal Thoughts Denies   Sensorium  Memory: Immediate Fair   Judgment: improving   Insight: Fair     Art therapist  Concentration: Good   Attention Span: Good   Recall: Good   Fund of Knowledge: Good   Language: Good     Psychomotor Activity  Psychomotor Activity:Relaxed   Assets  Assets: Desire for Improvement     Sleep  Sleep:Fine     Physical Exam: Physical Exam Constitutional:      Appearance: Normal appearance.  HENT:     Head: Normocephalic and atraumatic.     Nose: Nose normal.  Eyes:     Pupils: Pupils are equal, round, and reactive to light.  Cardiovascular:     Rate and Rhythm: Normal rate.  Pulmonary:     Effort: Pulmonary effort is normal.  Musculoskeletal:     Cervical back: Normal range of motion.     Right lower leg: No edema.     Left lower leg: No edema.  Skin:    General: Skin is warm and dry.  Neurological:     General: No focal deficit present.     Mental Status: He is alert and oriented to person, place, and time.       Review of Systems  Constitutional: Negative.   HENT: Negative.    Eyes: Negative.   Respiratory: Negative.    Cardiovascular: Negative.   Gastrointestinal: Negative.   Genitourinary: Negative.   Musculoskeletal: Negative.   Skin: Negative.   Neurological: Negative.   Endo/Heme/Allergies: Negative.   Psychiatric/Behavioral:  Positive for substance abuse.    Blood pressure (!) 140/104, pulse 89, temperature 99 F (37.2 C), temperature source Oral, resp. rate 19, height 5\' 11"  (1.803 m), weight 108.9 kg, SpO2 100%. Body mass index is 33.47 kg/m.  Mental Status Per Nursing Assessment::   On Admission:  NA  Demographic Factors:  Male and Living alone  Loss Factors: Financial problems/change in socioeconomic status  Historical Factors: Alcohol use  Risk Reduction Factors:   Positive therapeutic relationship and Positive coping skills or problem solving skills  Continued Clinical Symptoms:  Alcohol/Substance Abuse/Dependencies  Cognitive Features That Contribute To Risk:  None    Suicide Risk:  Minimal: No identifiable suicidal ideation.  Patients presenting with no risk factors but with morbid ruminations; may be classified as minimal risk based on the severity of the depressive symptoms    Per H&P  Lewanda Rife, MD

## 2023-05-02 NOTE — [facility] Counselor (Signed)
Adult Comprehensive Assessment  Patient ID: William Powers, male   DOB: [birth year], 42 y.o.   MRN: [id number]  Information Source: Information source: Patient  Current Stressors:  Patient states their primary concerns and needs for treatment are:: The patient satted that he wanted to detox and get out patient treatment for alcohol setup. Patient states their goals for this hospitilization and ongoing recovery are:: The patient satted that he wanted to detox and attend groups while in treatment. Educational / Learning stressors: the patient stated none. Employment / Job issues: the patient stated none. Family Relationships: the patient stated none. Financial / Lack of resources (include bankruptcy): the patient stated none. Housing / Lack of housing: the patient stated none. Physical health (include injuries & life threatening diseases): the patient stated none. Social relationships: the patient stated none. Substance abuse: The patient stated that his problems with alcohol abuse. Bereavement / Loss: the patient stated none.  Living/Environment/Situation:  Living Arrangements: Alone Living conditions (as described by patient or guardian): The patient stated that he rents a room but he also stays with family members when he can. How long has patient lived in current situation?: the patient stated 3-4 years. What is atmosphere in current home: Temporary  Family History:  Marital status: Divorced Divorced, when?: The patient stated that the marriage didnt last long. Does patient have children?: No  Childhood History:  By whom was/is the patient raised?: Both parents Description of patient's relationship with caregiver when they were a child: The patient stated that he had a excellent relationship with his parents. Patient's description of current relationship with people who raised him/her: The patient stated that both his parents has passed away. How were you disciplined when you got  in trouble as a child/adolescent?: the patient stated that he was not disciplined as a child, that he was spoiled by his parents. Does patient have siblings?: No Did patient suffer any verbal/emotional/physical/sexual abuse as a child?: No Did patient suffer from severe childhood neglect?: No Has patient ever been sexually abused/assaulted/raped as an adolescent or adult?: No Was the patient ever a victim of a crime or a disaster?: No Witnessed domestic violence?: No Has patient been affected by domestic violence as an adult?: No  Education:  Highest grade of school patient has completed: the patient stated 10 th grade. Currently a student?: No Learning disability?: No  Employment/Work Situation:   Employment Situation: On disability Why is Patient on Disability: the patient stated heart failure. How Long has Patient Been on Disability: the patient stated since 2019. What is the Longest Time Patient has Held a Job?: the patient stated 7 years. 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 Has Patient ever Been in the [employer]?: No  Financial Resources:   Surveyor, quantity resources: [employer], [place], Medicare Does patient have a representative payee or guardian?: No  Alcohol/Substance Abuse:   What has been your use of drugs/alcohol within the last 12 months?: The patient stated that he drinks when he gambles or gets depressed. the patient stated that its every other day. If attempted suicide, did drugs/alcohol play a role in this?: No Alcohol/Substance Abuse Treatment Hx: Past Tx, Outpatient, Past detox If yes, describe treatment: the patient stated that he has went through detox several times. Has alcohol/substance abuse ever caused legal problems?: No  Social Support System:   Patient's Community Support System: Good Describe Community Support System: the patient stated that his family and friends  are excellent. Type of  faith/religion: the patient stated none.  Leisure/Recreation:   Do You Have Hobbies?: No  Strengths/Needs:   What is the patient's perception of their strengths?: the patient stated his personality. Patient states these barriers may affect/interfere with their treatment: the patient stated none. Patient states these barriers may affect their return to the community: the patient stated none. Other important information patient would like considered in planning for their treatment: the patient stated none.  Discharge Plan:   Currently receiving community mental health services: No Patient states concerns and preferences for aftercare planning are: The patient stated that he wants to recieve treatment from [facility] in [place]. Patient states they will know when they are safe and ready for discharge when: the patient stated he is ready now. Does patient have access to transportation?: No Does patient have financial barriers related to discharge medications?: No Patient description of barriers related to discharge medications: the patient stated none. Plan for no access to transportation at discharge: the patient stated he would like a bus pass. Will patient be returning to same living situation after discharge?: Yes  Summary/Recommendations:   Summary and Recommendations (to be completed by the evaluator): The patient is a 42 year old Caucasian male from [place] [facility] [place]) Patient presents to the emergency department complaining of suicidal ideation. Patient states he tried to hurt himself by swallowing a steak in the woods earlier. He also states he plans to shoot himself. He does endorse a voice which is told him to kill himself, also endorses seeing 3 silhouettes/people which are not reportedly there. He denies HI at this time. He denied symptoms of alcohol withdrawals. The patient stated that he wants to go through detox and once discharge enter [facility] for  outpatient treatment to assist with his alcohol abuse. The patient stated that he drinks when he is depressed or when he gambles. The patient denies any stressors. The patient denies any past trauma. The patient states that he has a good support system. Recommendations include: crisis stabilization, therapeutic milieu, encourage group attendance and participation, medication management for detox/mood stabilization, and development of a comprehensive mental wellness/sobriety plan.  Marshell Levan. 05/02/2023

## 2023-05-02 NOTE — H&P (Signed)
Psychiatric Admission Assessment Adult  Patient Identification: William Powers MRN:  [id number] Date of Evaluation:  05/02/2023 Chief Complaint:  MDD (major depressive disorder), recurrent severe, without psychosis (HCC) [F33.2] Principal Diagnosis: MDD (major depressive disorder), recurrent severe, without psychosis (HCC) Diagnosis:  Principal Problem:   MDD (major depressive disorder), recurrent severe, without psychosis (HCC)  History of Present Illness: William Powers is a 42 y.o. male.  Patient presents to the emergency department complaining of suicidal ideation.  Patient states he tried to hurt himself by swallowing a steak in the woods earlier.  He also states he plans to shoot himself.  He does endorse a voice which is told him to kill himself, also endorses seeing 3 silhouettes/people which are not reportedly there.  He denies HI at this time.   Today patient was assessed on the unit.  He reported feeling" good".  Patient was cooperative and pleasant to talk to.  He denied symptoms of alcohol withdrawals.  Patient reported that he had about 1-1/2 pints after almost a week.  He reports that he binge drinks when he is with friends or on a vacation.  Patient is requesting to leave today.  He was encouraged to stay today and attend groups to improve his coping strategies.  Patient reports that he does not have any intention or plan to harm himself.  He reports" I was drunk when I made those statements".  Patient denies auditory visual hallucinations today.  He denies access to guns.  He denies any intention to buy a gun.  Patient denies manic symptoms.  Patient was offered an antidepressant, patient reports that he does not feel depressed and does not think he needs an antidepressant.  Patient was encouraged to attend group and work on coping strategies and a safe discharge plan.  Patient was encouraged to stay sober and consider outpatient rehab programs.  Patient is motivated to go to [employer] and have a sponsor to stay sober.  Past Psychiatric History: Patient reports he has been admitted to detox center in the past.  He denies any outpatient follow-up.  Patient denies taking any psychotropic medicine  Is the patient at risk to self? No.  Has the patient been a risk to self in the past 6 months? No.  Has the patient been a risk to self within the distant past? No.  Is the patient a risk to others? No.  Has the patient been a risk to others in the past 6 months? No.  Has the patient been a risk to others within the distant past? No.   [place] Scale:  Flowsheet Row Admission (Current) from 05/01/2023 in [facility]PAT[facility]ICINE Most recent reading at 05/01/2023 10:54 AM ED from 05/01/2023 in [facility] Emergency Department at [facility] Most recent reading at 05/01/2023 12:33 AM ED from 04/11/2023 in [facility] Emergency Department at [facility] Most recent reading at 04/12/2023 12:34 AM  C-SSRS RISK CATEGORY No Risk High Risk High Risk        Prior Inpatient Therapy: Yes.   Detox Prior Outpatient Therapy: No.   Alcohol Screening: 1. How often do you have a drink containing alcohol?: 2 to 3 times a week 2. How many drinks containing alcohol do you have on a typical day when you are drinking?: 3 or 4 3. How often do you have six or more drinks on one occasion?: Weekly AUDIT-C Score: 7 4. How often during the last year have you found that  you were not able to stop drinking once you had started?: Never 5. How often during the last year have you failed to do what was normally expected from you because of drinking?: Never 6. How often during the last year have you needed a first drink in the morning to get yourself going after a heavy drinking session?: Never 7. How often during the last year have you had a feeling of guilt of remorse after drinking?: Never 8. How often during the last year have you been unable to remember what happened the night  before because you had been drinking?: Less than monthly 9. Have you or someone else been injured as a result of your drinking?: No 10. Has a relative or friend or a doctor or another health worker been concerned about your drinking or suggested you cut down?: Yes, but not in the last year Alcohol Use Disorder Identification Test Final Score (AUDIT): 10 Alcohol Brief Interventions/Follow-up: Alcohol education/Brief advice Substance Abuse History in the last 12 months:  Yes.   Patient reports he binge drinks off and on.  He denies use of illicit drugs, UDS negative. Patient reports he smokes Black and mild  Past Medical History:  Past Medical History:  Diagnosis Date   CHF (congestive heart failure) (HCC)    Heart failure (HCC)    HFrEF (heart failure with reduced ejection fraction) (HCC)    15% EF as of 2019   HIV (human immunodeficiency virus infection) (HCC)    Hypertension    Polysubstance abuse (HCC)    Pulmonary embolism (HCC)    Single subsegmental pulmonary embolism without acute cor pulmonale (HCC) 07/23/2021    Past Surgical History:  Procedure Laterality Date   HERNIA REPAIR     Family History:  Family History  Problem Relation Age of Onset   Lupus Mother    HIV Mother    HIV Father     Tobacco Screening:  Social History   Tobacco Use  Smoking Status Some Days   Current packs/day: 0.50   Types: Cigarettes, Cigars  Smokeless Tobacco Never    BH Tobacco Counseling     Are you interested in Tobacco Cessation Medications?  No, patient refused Counseled patient on smoking cessation:  Refused/Declined practical counseling Reason Tobacco Screening Not Completed: Patient Refused Screening       Social History:  Social History   Substance and Sexual Activity  Alcohol Use Yes   Alcohol/week: 5.0 standard drinks of alcohol   Types: 5 Cans of beer per week   Comment: Last drink 2 hours PTA     Social History   Substance and Sexual Activity  Drug Use Yes    Types: Marijuana, Cocaine   Comment: Fentanyl    Additional Social History:                           Allergies:   Allergies  Allergen Reactions   Bee Venom Anaphylaxis   Zestril [Lisinopril] Swelling    Lip swelling    Bactrim [Sulfamethoxazole-Trimethoprim] Swelling and Rash   Lab Results:  Results for orders placed or performed during the hospital encounter of 05/01/23 (from the past 48 hour(s))  Comprehensive metabolic panel     Status: Abnormal   Collection Time: 05/01/23  1:20 AM  Result Value Ref Range   Sodium 138 135 - 145 mmol/L   Potassium 3.5 3.5 - 5.1 mmol/L   Chloride 105 98 - 111 mmol/L  CO2 21 (L) 22 - 32 mmol/L   Glucose, Bld 85 70 - 99 mg/dL    Comment: Glucose reference range applies only to samples taken after fasting for at least 8 hours.   BUN 12 6 - 20 mg/dL   Creatinine, Ser 5.36 0.61 - 1.24 mg/dL   Calcium 8.5 (L) 8.9 - 10.3 mg/dL   Total Protein 8.3 (H) 6.5 - 8.1 g/dL   Albumin 3.4 (L) 3.5 - 5.0 g/dL   AST 40 15 - 41 U/L   ALT 33 0 - 44 U/L   Alkaline Phosphatase 50 38 - 126 U/L   Total Bilirubin 0.7 0.3 - 1.2 mg/dL   GFR, Estimated >64 >40 mL/min    Comment: (NOTE) Calculated using the CKD-EPI Creatinine Equation (2021)    Anion gap 12 5 - 15    Comment: Performed at [facility], [address], [place], [place] [address]  Ethanol     Status: Abnormal   Collection Time: 05/01/23  1:20 AM  Result Value Ref Range   Alcohol, Ethyl (B) 166 (H) <10 mg/dL    Comment: (NOTE) Lowest detectable limit for serum alcohol is 10 mg/dL.  For medical purposes only. Performed at [facility], [address] [place], [place] [address]   CBC with Diff     Status: Abnormal   Collection Time: 05/01/23  1:20 AM  Result Value Ref Range   WBC 3.2 (L) 4.0 - 10.5 K/uL   RBC 4.19 (L) 4.22 - 5.81 MIL/uL   Hemoglobin 13.1 13.0 - 17.0 g/dL   HCT 87.5 64.3 - 32.9 %   MCV 95.7 80.0 - 100.0 fL   MCH 31.3 26.0 - 34.0 pg   MCHC 32.7  30.0 - 36.0 g/dL   RDW 51.8 84.1 - 66.0 %   Platelets 292 150 - 400 K/uL   nRBC 0.0 0.0 - 0.2 %   Neutrophils Relative % 53 %   Neutro Abs 1.7 1.7 - 7.7 K/uL   Lymphocytes Relative 32 %   Lymphs Abs 1.0 0.7 - 4.0 K/uL   Monocytes Relative 10 %   Monocytes Absolute 0.3 0.1 - 1.0 K/uL   Eosinophils Relative 4 %   Eosinophils Absolute 0.1 0.0 - 0.5 K/uL   Basophils Relative 1 %   Basophils Absolute 0.0 0.0 - 0.1 K/uL   Immature Granulocytes 0 %   Abs Immature Granulocytes 0.01 0.00 - 0.07 K/uL   Ovalocytes PRESENT     Comment: Performed at [facility], [address] [place], [place] [address]  Urine rapid drug screen (hosp performed)     Status: None   Collection Time: 05/01/23  3:37 AM  Result Value Ref Range   Opiates NONE DETECTED NONE DETECTED   Cocaine NONE DETECTED NONE DETECTED   Benzodiazepines NONE DETECTED NONE DETECTED   Amphetamines NONE DETECTED NONE DETECTED   Tetrahydrocannabinol NONE DETECTED NONE DETECTED   Barbiturates NONE DETECTED NONE DETECTED    Comment: (NOTE) DRUG SCREEN FOR MEDICAL PURPOSES ONLY.  IF CONFIRMATION IS NEEDED FOR ANY PURPOSE, NOTIFY LAB WITHIN 5 DAYS.  LOWEST DETECTABLE LIMITS FOR URINE DRUG SCREEN Drug Class                     Cutoff (ng/mL) Amphetamine and metabolites    1000 Barbiturate and metabolites    200 Benzodiazepine                 200 Opiates and  metabolites        300 Cocaine and metabolites        300 THC                            50 Performed at [facility], [address], [place], [place] [address]     Blood Alcohol level:  Lab Results  Component Value Date   ETH 166 (H) 05/01/2023   ETH 182 (H) 04/12/2023    Metabolic Disorder Labs:  Lab Results  Component Value Date   HGBA1C 4.9 12/06/2022   MPG 93.93 12/06/2022   No results found for: "PROLACTIN" Lab Results  Component Value Date   CHOL 145 01/27/2023   TRIG 153 (H) 01/27/2023   HDL 55 01/27/2023   CHOLHDL 2.6 01/27/2023   VLDL  31 01/27/2023   LDLCALC 59 01/27/2023   LDLCALC 50 12/06/2022    Current Medications: Current Facility-Administered Medications  Medication Dose Route Frequency Provider Last Rate Last Admin   acetaminophen (TYLENOL) tablet 650 mg  650 mg Oral Q6H PRN Onuoha, Chinwendu V, NP       alum & mag hydroxide-simeth (MAALOX/MYLANTA) 200-200-20 MG/5ML suspension 30 mL  30 mL Oral Q4H PRN Onuoha, Chinwendu V, NP       diphenhydrAMINE (BENADRYL) capsule 50 mg  50 mg Oral TID PRN Onuoha, Chinwendu V, NP       Or   diphenhydrAMINE (BENADRYL) injection 50 mg  50 mg Intramuscular TID PRN Onuoha, Chinwendu V, NP       haloperidol (HALDOL) tablet 5 mg  5 mg Oral TID PRN Onuoha, Chinwendu V, NP       Or   haloperidol lactate (HALDOL) injection 5 mg  5 mg Intramuscular TID PRN Onuoha, Chinwendu V, NP       hydrOXYzine (ATARAX) tablet 25 mg  25 mg Oral Q6H PRN Charm Rings, NP       loperamide (IMODIUM) capsule 2-4 mg  2-4 mg Oral PRN Charm Rings, NP       LORazepam (ATIVAN) tablet 1 mg  1 mg Oral Q6H PRN Charm Rings, NP       LORazepam (ATIVAN) tablet 1 mg  1 mg Oral QID Charm Rings, NP       Followed by   Melene Muller ON 05/03/2023] LORazepam (ATIVAN) tablet 1 mg  1 mg Oral TID Charm Rings, NP       Followed by   Melene Muller ON 05/04/2023] LORazepam (ATIVAN) tablet 1 mg  1 mg Oral BID Charm Rings, NP       Followed by   Melene Muller ON 05/05/2023] LORazepam (ATIVAN) tablet 1 mg  1 mg Oral Daily Lord, Jamison Y, NP       magnesium hydroxide (MILK OF MAGNESIA) suspension 30 mL  30 mL Oral Daily PRN Onuoha, Chinwendu V, NP       multivitamin with minerals tablet 1 tablet  1 tablet Oral Daily Lord, Jamison Y, NP       ondansetron (ZOFRAN-ODT) disintegrating tablet 4 mg  4 mg Oral Q6H PRN Charm Rings, NP       thiamine (VITAMIN B1) injection 100 mg  100 mg Intramuscular Once Charm Rings, NP       [START ON 05/03/2023] thiamine (VITAMIN B1) tablet 100 mg  100 mg Oral Daily Charm Rings, NP        traZODone (DESYREL) tablet 50 mg  50 mg Oral QHS PRN Onuoha, Chinwendu V, NP       PTA Medications: Medications Prior to Admission  Medication Sig Dispense Refill Last Dose   amoxicillin (AMOXIL) 500 MG capsule Take 1 capsule (500 mg total) by mouth 3 (three) times daily. 30 capsule 0    Musculoskeletal: Strength & Muscle Tone: within normal limits Gait & Station: normal Patient leans: N/A   Psychiatric Specialty Exam:   Presentation  General Appearance:  Casual   Eye Contact: Good   Speech: Clear and Coherent   Speech Volume: Normal   Handedness: Right     Mood and Affect  Mood: Good   Affect: Appropriate     Thought Process  Thought Processes: Coherent   Descriptions of Associations:Goal directed   Orientation:Full (Time, Place and Person)   Thought Content: Improved   History of Schizophrenia/Schizoaffective disorder:No   Hallucinations:Denies Ideas of Reference:None   Suicidal Thoughts:Denies Homicidal Thoughts Denies   Sensorium  Memory: Immediate Fair   Judgment: improving   Insight: Fair     Art therapist  Concentration: Good   Attention Span: Good   Recall: Good   Fund of Knowledge: Good   Language: Good     Psychomotor Activity  Psychomotor Activity:Relaxed   Assets  Assets: Desire for Improvement     Sleep  Sleep:Fine     Physical Exam: Physical Exam Constitutional:      Appearance: Normal appearance.  HENT:     Head: Normocephalic and atraumatic.     Nose: Nose normal.  Eyes:     Pupils: Pupils are equal, round, and reactive to light.  Cardiovascular:     Rate and Rhythm: Normal rate.  Pulmonary:     Effort: Pulmonary effort is normal.  Musculoskeletal:     Cervical back: Normal range of motion.     Right lower leg: No edema.     Left lower leg: No edema.  Skin:    General: Skin is warm and dry.  Neurological:     General: No focal deficit present.     Mental Status: He is alert and  oriented to person, place, and time.      Review of Systems  Constitutional: Negative.   HENT: Negative.    Eyes: Negative.   Respiratory: Negative.    Cardiovascular: Negative.   Gastrointestinal: Negative.   Genitourinary: Negative.   Musculoskeletal: Negative.   Skin: Negative.   Neurological: Negative.   Endo/Heme/Allergies: Negative.   Psychiatric/Behavioral:  Positive for substance abuse.      Blood pressure (!) 133/95, pulse 90, temperature 99 F (37.2 C), temperature source Oral, resp. rate 19, height 5\' 11"  (1.803 m), weight 108.9 kg, SpO2 100%. Body mass index is 33.47 kg/m.  Treatment Plan Summary: Daily contact with patient to assess and evaluate symptoms and progress in treatment and Medication management  Observation Level/Precautions:  15 minute checks  Laboratory  Psychotherapy:    Medications: Patient was offered an antidepressant.  Patient reports he does not want to take any psychotropic medicine .  He is willing to consider individual psychotherapy for discharge and go to outpatient rehab program  Consultations:    Discharge Concerns:    Estimated LOS:  Other:     Physician Treatment Plan for Primary Diagnosis: MDD (major depressive disorder), recurrent severe, without psychosis (HCC) Long Term Goal(s): Improvement in symptoms so as ready for discharge  Short Term Goals: Ability to identify changes in lifestyle to reduce recurrence of condition will improve, Ability to  verbalize feelings will improve, Ability to disclose and discuss suicidal ideas, Ability to demonstrate self-control will improve, Ability to identify and develop effective coping behaviors will improve, Compliance with prescribed medications will improve, and Ability to identify triggers associated with substance abuse/mental health issues will improve   I certify that inpatient services furnished can reasonably be expected to improve the patient's condition.    Lewanda Rife, MD

## 2023-05-02 NOTE — Progress Notes (Signed)
D- Patient alert and oriented. Patient presented in a pleasant mood on assessment reporting that he slept "good" last night and had no complaints to voice to this Clinical research associate. Patient denied anxiety, but endorsed depression stating "just life" has him feeling this way. Patient denied SI, HI, AVH, and pain at this time. Patient's goal for today is to "continue writing things down that need to be done to better my situation", in which he will "write and come up with positive solutions", in order to achieve his goal.  A- Scheduled medications administered to patient, per MD orders. Support and encouragement provided. Routine safety checks conducted every 15 minutes. Patient informed to notify staff with problems or concerns.  R- No adverse drug reactions noted. Patient contracts for safety at this time. Patient compliant with medications and treatment plan. Patient receptive, calm, and cooperative. Patient interacts well with others on the unit. Patient remains safe at this time.    05/02/23 0900  Psych Admission Type (Psych Patients Only)  Admission Status Voluntary  Psychosocial Assessment  Patient Complaints Depression  Eye Contact Fair  Facial Expression Flat  Affect Appropriate to circumstance  Speech Logical/coherent  Interaction Assertive  Motor Activity Slow  Appearance/Hygiene In scrubs;Unremarkable  Behavior Characteristics Resistant to care (pt. refused scheduled medication)  Mood Pleasant  Aggressive Behavior  Effect No apparent injury  Thought Process  Coherency Circumstantial  Content WDL  Delusions None reported or observed  Perception WDL  Hallucination None reported or observed  Judgment WDL  Confusion None  Danger to Self  Current suicidal ideation? Denies  Agreement Not to Harm Self Yes  Description of Agreement Verbal  Danger to Others  Danger to Others None reported or observed

## 2023-05-02 NOTE — Plan of Care (Signed)
  Problem: Education: Goal: Knowledge of General Education information will improve Description: Including pain rating scale, medication(s)/side effects and non-pharmacologic comfort measures Outcome: Progressing   Problem: Health Behavior/Discharge Planning: Goal: Ability to manage health-related needs will improve Outcome: Progressing   Problem: Clinical Measurements: Goal: Ability to maintain clinical measurements within normal limits will improve Outcome: Progressing Goal: Will remain free from infection Outcome: Progressing Goal: Diagnostic test results will improve Outcome: Progressing Goal: Respiratory complications will improve Outcome: Progressing Goal: Cardiovascular complication will be avoided Outcome: Progressing   Problem: Activity: Goal: Risk for activity intolerance will decrease Outcome: Progressing   Problem: Nutrition: Goal: Adequate nutrition will be maintained Outcome: Progressing   Problem: Coping: Goal: Level of anxiety will decrease Outcome: Progressing   Problem: Elimination: Goal: Will not experience complications related to bowel motility Outcome: Progressing Goal: Will not experience complications related to urinary retention Outcome: Progressing   Problem: Pain Managment: Goal: General experience of comfort will improve Outcome: Progressing   Problem: Safety: Goal: Ability to remain free from injury will improve Outcome: Progressing   Problem: Skin Integrity: Goal: Risk for impaired skin integrity will decrease Outcome: Progressing   Problem: Education: Goal: Knowledge of [place] General Education information/materials will improve Outcome: Progressing Goal: Emotional status will improve Outcome: Progressing Goal: Mental status will improve Outcome: Progressing Goal: Verbalization of understanding the information provided will improve Outcome: Progressing   Problem: Activity: Goal: Interest or engagement in activities will  improve Outcome: Progressing Goal: Sleeping patterns will improve Outcome: Progressing   Problem: Coping: Goal: Ability to verbalize frustrations and anger appropriately will improve Outcome: Progressing Goal: Ability to demonstrate self-control will improve Outcome: Progressing   Problem: Health Behavior/Discharge Planning: Goal: Identification of resources available to assist in meeting health care needs will improve Outcome: Progressing Goal: Compliance with treatment plan for underlying cause of condition will improve Outcome: Progressing   Problem: Physical Regulation: Goal: Ability to maintain clinical measurements within normal limits will improve Outcome: Progressing   Problem: Safety: Goal: Periods of time without injury will increase Outcome: Progressing   Problem: Education: Goal: Utilization of techniques to improve thought processes will improve Outcome: Progressing Goal: Knowledge of the prescribed therapeutic regimen will improve Outcome: Progressing   Problem: Activity: Goal: Interest or engagement in leisure activities will improve Outcome: Progressing Goal: Imbalance in normal sleep/wake cycle will improve Outcome: Progressing   Problem: Coping: Goal: Coping ability will improve Outcome: Progressing Goal: Will verbalize feelings Outcome: Progressing   Problem: Health Behavior/Discharge Planning: Goal: Ability to make decisions will improve Outcome: Progressing Goal: Compliance with therapeutic regimen will improve Outcome: Progressing   Problem: Role Relationship: Goal: Will demonstrate positive changes in social behaviors and relationships Outcome: Progressing   Problem: Safety: Goal: Ability to disclose and discuss suicidal ideas will improve Outcome: Progressing Goal: Ability to identify and utilize support systems that promote safety will improve Outcome: Progressing   Problem: Self-Concept: Goal: Will verbalize positive feelings about  self Outcome: Progressing Goal: Level of anxiety will decrease Outcome: Progressing   

## 2023-05-02 NOTE — Plan of Care (Signed)
  Problem: Education: Goal: Knowledge of General Education information will improve Description Including pain rating scale, medication(s)/side effects and non-pharmacologic comfort measures Outcome: Progressing   

## 2023-05-02 NOTE — [facility] Suicide Risk Assessment (Signed)
[facility] Admission Suicide Risk Assessment   Nursing information obtained from:  Patient Demographic factors:  Male Current Mental Status:  NA Loss Factors:  NA Historical Factors:  NA Risk Reduction Factors:  Positive coping skills or problem solving skills, Living with another person, especially a relative, Positive social support, Sense of responsibility to family   Principal Problem: MDD (major depressive disorder), recurrent severe, without psychosis (HCC) Diagnosis:  Principal Problem:   MDD (major depressive disorder), recurrent severe, without psychosis (HCC)  Subjective Data: William Powers is a 42 y.o. male.  Patient presents to the emergency department complaining of suicidal ideation.  Patient states he tried to hurt himself by swallowing a steak in the woods earlier.  He also states he plans to shoot himself.  He does endorse a voice which is told him to kill himself, also endorses seeing 3 silhouettes/people which are not reportedly there.  He denies HI.    Continued Clinical Symptoms:  Alcohol Use Disorder Identification Test Final Score (AUDIT): 10 The "Alcohol Use Disorders Identification Test", Guidelines for Use in Primary Care, Second Edition.  World Science writer [facility]). Score between 0-7:  no or low risk or alcohol related problems. Score between 8-15:  moderate risk of alcohol related problems. Score between 16-19:  high risk of alcohol related problems. Score 20 or above:  warrants further diagnostic evaluation for alcohol dependence and treatment.   CLINICAL FACTORS:   Depression:   Comorbid alcohol abuse/dependence   Musculoskeletal: Strength & Muscle Tone: within normal limits Gait & Station: normal Patient leans: N/A  Psychiatric Specialty Exam:  Presentation  General Appearance:  Casual  Eye Contact: Good  Speech: Clear and Coherent  Speech Volume: Normal  Handedness: Right   Mood and Affect   Mood: Good  Affect: Appropriate   Thought Process  Thought Processes: Coherent  Descriptions of Associations:Goal directed  Orientation:Full (Time, Place and Person)  Thought Content: Improved  History of Schizophrenia/Schizoaffective disorder:No  Hallucinations:Denies Ideas of Reference:None  Suicidal Thoughts:Denies Homicidal Thoughts Denies  Sensorium  Memory: Immediate Fair  Judgment: Fair  Insight: Fair   Art therapist  Concentration: Good  Attention Span: Good  Recall: Good  Fund of Knowledge: Good  Language: Good   Psychomotor Activity  Psychomotor Activity:Relaxed  Assets  Assets: Desire for Improvement   Sleep  Sleep:Fine   Physical Exam: Physical Exam Constitutional:      Appearance: Normal appearance.  HENT:     Head: Normocephalic and atraumatic.     Nose: Nose normal.  Eyes:     Pupils: Pupils are equal, round, and reactive to light.  Cardiovascular:     Rate and Rhythm: Normal rate.  Pulmonary:     Effort: Pulmonary effort is normal.  Musculoskeletal:     Cervical back: Normal range of motion.     Right lower leg: No edema.     Left lower leg: No edema.  Skin:    General: Skin is warm and dry.  Neurological:     General: No focal deficit present.     Mental Status: He is alert and oriented to person, place, and time.    Review of Systems  Constitutional: Negative.   HENT: Negative.    Eyes: Negative.   Respiratory: Negative.    Cardiovascular: Negative.   Gastrointestinal: Negative.   Genitourinary: Negative.   Musculoskeletal: Negative.   Skin: Negative.   Neurological: Negative.   Endo/Heme/Allergies: Negative.   Psychiatric/Behavioral:  Positive for substance abuse.    Blood pressure Marland Kitchen)  133/95, pulse 90, temperature 99 F (37.2 C), temperature source Oral, resp. rate 19, height 5\' 11"  (1.803 m), weight 108.9 kg, SpO2 100%. Body mass index is 33.47 kg/m.   COGNITIVE FEATURES THAT  CONTRIBUTE TO RISK:  None    SUICIDE RISK:   Mild:  Suicidal ideation of limited frequency, intensity, duration, and specificity.  good self-control (both objective and subjective assessment), few other risk factors, and identifiable protective factors  PLAN OF CARE:Per H&P  I certify that inpatient services furnished can reasonably be expected to improve the patient's condition.   Lewanda Rife, MD

## 2023-05-02 NOTE — Progress Notes (Signed)
Patient reports no withdrawal symptoms and continues to refuse scheduled Ativan.   05/02/23 1200  CIWA-Ar  Nausea and Vomiting 0  Tactile Disturbances 0  Tremor 0  Auditory Disturbances 0  Paroxysmal Sweats 0  Visual Disturbances 0  Anxiety 0  Headache, Fullness in Head 0  Agitation 0  Orientation and Clouding of Sensorium 0  CIWA-Ar Total 0

## 2023-05-03 NOTE — Progress Notes (Signed)
Patient is alert and oriented. Took all scheduled medications this morning. Ate breakfast. Denies SI/HI/AVH. Patient discharged this morning at [phone]. Reviewed discharge instructions. Patient verbalized understanding. Patient received all personal belongings including prescription for Hydroxyzine. Bus passes given to patient along with his discharge paperwork.

## 2023-05-03 NOTE — Progress Notes (Signed)
  [facility] Adult Case Management Discharge Plan :  Will you be returning to the same living situation after discharge:  Yes,  [address] [place],  At discharge, do you have transportation home?: yes, patient was provided with a bus pass. Do you have the ability to pay for your medications: Yes,  Medicaid  Release of information consent forms completed and in the chart;  Patient's signature needed at discharge.  Patient to Follow up at: [facility] in [place], [place]   Next level of care provider has access to [facility] Link:yes  Safety Planning and Suicide Prevention discussed: Yes,  Patient declined     Has patient been referred to the Quitline?: Patient refused referral for treatment  Patient has been referred for addiction treatment: Yes, referral information given but appointment not made [facility]: Recovery (list facility).  Marshell Levan, LCSW 05/03/2023, 8:22 AM

## 2023-05-03 NOTE — Plan of Care (Signed)

## 2023-05-03 NOTE — Plan of Care (Signed)

## 2023-05-03 NOTE — Progress Notes (Signed)
   05/03/23 0840  Psych Admission Type (Psych Patients Only)  Admission Status Voluntary  Psychosocial Assessment  Patient Complaints None  Eye Contact Brief  Facial Expression Flat  Affect Appropriate to circumstance  Speech Logical/coherent  Interaction Assertive  Motor Activity Fidgety  Appearance/Hygiene Improved  Behavior Characteristics Cooperative  Mood Pleasant  Aggressive Behavior  Effect No apparent injury  Thought Process  Coherency WDL  Content WDL  Delusions None reported or observed  Perception WDL  Hallucination None reported or observed  Judgment WDL  Confusion WDL  Danger to Self  Current suicidal ideation? Denies  Agreement Not to Harm Self Yes  Description of Agreement verbal  Danger to Others  Danger to Others None reported or observed  Danger to Others Abnormal  Harmful Behavior to others No threats or harm toward other people  Destructive Behavior No threats or harm toward property

## 2023-05-03 NOTE — Discharge Summary (Signed)
Physician Discharge Summary Note  Patient:  William Powers is an 42 y.o., male MRN:  [id number] DOB:  1981-05-03 Patient phone:  [phone] (home)  Patient address:   [address] [place] [address],   Date of Admission:  05/01/2023 Date of Discharge: 05/03/23  Reason for Admission:  William Powers is a 42 y.o. male. Patient presents to the emergency department complaining of suicidal ideation. Patient states he tried to hurt himself by swallowing a steak in the woods earlier. He also states he plans to shoot himself. He does endorse a voice which is told him to kill himself, also endorses seeing 3 silhouettes/people which are not reportedly there. He denies HI at this time.   Principal Problem: MDD (major depressive disorder), recurrent severe, without psychosis (HCC) Discharge Diagnoses: Principal Problem:   MDD (major depressive disorder), recurrent severe, without psychosis (HCC)     Past Medical History:  Past Medical History:  Diagnosis Date   CHF (congestive heart failure) (HCC)    Heart failure (HCC)    HFrEF (heart failure with reduced ejection fraction) (HCC)    15% EF as of 2019   HIV (human immunodeficiency virus infection) (HCC)    Hypertension    Polysubstance abuse (HCC)    Pulmonary embolism (HCC)    Single subsegmental pulmonary embolism without acute cor pulmonale (HCC) 07/23/2021    Past Surgical History:  Procedure Laterality Date   HERNIA REPAIR     Family History:  Family History  Problem Relation Age of Onset   Lupus Mother    HIV Mother    HIV Father     Social History:  Social History   Substance and Sexual Activity  Alcohol Use Yes   Alcohol/week: 5.0 standard drinks of alcohol   Types: 5 Cans of beer per week   Comment: Last drink 2 hours PTA     Social History   Substance and Sexual Activity  Drug Use Yes   Types: Marijuana, Cocaine   Comment: Fentanyl    Social History   Socioeconomic History   Marital status:  Divorced    Spouse name: Not on file   Number of children: Not on file   Years of education: Not on file   Highest education level: Not on file  Occupational History   Not on file  Tobacco Use   Smoking status: Some Days    Current packs/day: 0.50    Types: Cigarettes, Cigars   Smokeless tobacco: Never  Vaping Use   Vaping status: Never Used  Substance and Sexual Activity   Alcohol use: Yes    Alcohol/week: 5.0 standard drinks of alcohol    Types: 5 Cans of beer per week    Comment: Last drink 2 hours PTA   Drug use: Yes    Types: Marijuana, Cocaine    Comment: Fentanyl   Sexual activity: Not Currently  Other Topics Concern   Not on file  Social History Narrative   Not on file   Social Determinants of Health   Financial Resource Strain: Low Risk  (02/01/2022)   Received from [facility], [facility]   Overall Financial Resource Strain (CARDIA)    Difficulty of Paying Living Expenses: Not hard at all  Food Insecurity: No Food Insecurity (05/01/2023)   Hunger Vital Sign    Worried About Running Out of Food in the Last Year: Never true    Ran Out of Food in the Last Year: Never true  Recent Concern:  Food Insecurity - High Risk (03/24/2023)   Received from [facility], [facility]   Food Insecurity    Within the past 12 months, did the food you bought just not last and you didn't have money to get more?: Yes    Within the past 12 months, did you worry that your food would run out before you got money to buy more?: Yes  Transportation Needs: No Transportation Needs (05/01/2023)   PRAPARE - Transportation    Lack of Transportation (Medical): No    Lack of Transportation (Non-Medical): No  Recent Concern: Transportation Needs - High Risk (03/24/2023)   Received from [facility], [facility]   Transportation Needs    Within the past 12 months, has a lack of transportation kept you from medical appointments or from  doing things needed for daily living?: Yes  Physical Activity: Sufficiently Active (02/01/2022)   [facility]   Exercise Vital Sign    Days of Exercise per Week: 4 days    Minutes of Exercise per Session: 40 min  Stress: Stress Concern Present (02/01/2022)   Received from [facility], [facility] - Occupational Stress Questionnaire    Feeling of Stress : Rather much  Social Connections: Socially Isolated (02/01/2022)   Received from [facility], [facility]   Social Connection and Isolation Panel [NHANES]    Frequency of Communication with Friends and Family: More than three times a week    Frequency of Social Gatherings with Friends and Family: Not on file    Attends Religious Services: Never    Active Member of Clubs or Organizations: Not on file    Attends Banker Meetings: Never    Marital Status: Divorced    Hospital Course:  The patient was admitted to Inpatient psychiatric treatment for stabilization of suicidal thoughts and hallucinations in context of alcohol use.  Patient was placed on suicidal precautions. The patient was evaluated and treated by the multidisciplinary treatment team including physicians, nurses, social workers and therapists. All medications were presented to the patient and the Patient gave consent to all the medications that they were given, as well as was explained the risks, benefits, side effects and alternatives of all medication therapies. The patient was integrated into the general milieu on the ward and encouraged to attend to his ADLs and participate in all groups and activities. During hospital course the Patient attended coping skill groups, music therapy and activity therapy groups. Patient was counseled on cognitive techniques/skills by multiple staff members and given support care by the staff.  Patient was started on CIWA protocol for alcohol  detox.  Patient scored on CIWA was reported to be 0.  Patient was assessed prior to his discharge.  Patient denies any intention or plan of harming himself.  He denied any psychotic symptoms.  He had requested to be discharged.  Apparently patient was reported to be a voluntary admission.  He was not detainable.  There was some confusion about patient's status upon discharge. Patient reports that he came in voluntarily.  Patient was offered an antidepressant which he declined.  He was encouraged to stay sober.  He was in agreement to follow-up with psychotherapist and also go to [employer]  Patient was counseled on cardiac diet and follow-up with PCP for mild increase in his blood pressure.  Patient reports" this is  normal for me". Patient denies access to guns.  He denies any intention to by a gun.  During the hospitalization, the patient demonstrated a stabilization of mood with decreased racing thoughts, decreased impulsivity, improved sleep and appetite. At the time of discharge, the patient denied any suicidal ideation/homicidal ideation and was not overtly depressed, manic or psychotic. The Patient was interacting well with their peers. Patient was able to identify a safety plan to include speaking with family, contacting outpatient provider or calling 911 if hallucinations/delusions returned or worsened or thoughts of self-harm or suicide return. Patient was counselled on outpatient follow-up that was arranged prior to discharge.  Physical Findings: AIMS:  , ,  ,  ,    CIWA:  CIWA-Ar Total: 0 COWS:      Musculoskeletal: Strength & Muscle Tone: within normal limits Gait & Station: normal Patient leans: N/A   Psychiatric Specialty Exam:   Presentation  General Appearance:  Casual   Eye Contact: Good   Speech: Clear and Coherent   Speech Volume: Normal   Handedness: Right     Mood and Affect  Mood: Good   Affect: Appropriate     Thought Process  Thought  Processes: Coherent   Descriptions of Associations:Goal directed   Orientation:Full (Time, Place and Person)   Thought Content: Improved   History of Schizophrenia/Schizoaffective disorder:No   Hallucinations:Denies Ideas of Reference:None   Suicidal Thoughts:Denies, no intention or plan Homicidal Thoughts Denies   Sensorium  Memory: Immediate Fair   Judgment: Improved, patient agrees to go to AA meetings to stay sober   Insight: Fair     Art therapist  Concentration: Good   Attention Span: Good   Recall: Peri Jefferson   Fund of Knowledge: Good   Language: Good     Psychomotor Activity  Psychomotor Activity:Relaxed   Assets  Assets: Desire for Improvement     Sleep  Sleep:Fine      Physical Exam Constitutional:      Appearance: Normal appearance.  HENT:     Head: Normocephalic and atraumatic.     Nose: Nose normal.  Eyes:     Pupils: Pupils are equal, round, and reactive to light.  Cardiovascular:     Rate and Rhythm: Normal rate.  Pulmonary:     Effort: Pulmonary effort is normal.  Musculoskeletal:     Cervical back: Normal range of motion.     Right lower leg: No edema.     Left lower leg: No edema.  Skin:    General: Skin is warm and dry.  Neurological:     General: No focal deficit present.     Mental Status: He is alert and oriented to person, place, and time.      Review of Systems  Constitutional: Negative.   HENT: Negative.    Eyes: Negative.   Respiratory: Negative.    Cardiovascular: Negative.   Gastrointestinal: Negative.   Genitourinary: Negative.   Musculoskeletal: Negative.   Skin: Negative.   Neurological: Negative.   Endo/Heme/Allergies: Negative.   Psychiatric/Behavioral:  Positive for substance abuse.      Blood pressure (!) 132/90, pulse 81, temperature 99 F (37.2 C), temperature source Oral, resp. rate 18, height 5\' 11"  (1.803 m), weight 108.9 kg, SpO2 100%. Body mass index is 33.47 kg/m.   Social  History   Tobacco Use  Smoking Status Some Days   Current packs/day: 0.50   Types: Cigarettes, Cigars  Smokeless Tobacco Never   Tobacco Cessation:  A prescription for an FDA-approved  tobacco cessation medication was offered at discharge and the patient refused   Blood Alcohol level:  Lab Results  Component Value Date   ETH 166 (H) 05/01/2023   ETH 182 (H) 04/12/2023    Metabolic Disorder Labs:  Lab Results  Component Value Date   HGBA1C 4.9 12/06/2022   MPG 93.93 12/06/2022   No results found for: "PROLACTIN" Lab Results  Component Value Date   CHOL 145 01/27/2023   TRIG 153 (H) 01/27/2023   HDL 55 01/27/2023   CHOLHDL 2.6 01/27/2023   VLDL 31 01/27/2023   LDLCALC 59 01/27/2023   LDLCALC 50 12/06/2022    See Psychiatric Specialty Exam and Suicide Risk Assessment completed by Attending Physician prior to discharge.  Discharge destination:  Home   Recommended Plan for Multiple Antipsychotic Therapies: NA   Allergies as of 05/03/2023       Reactions   Bee Venom Anaphylaxis   Zestril [lisinopril] Swelling   Lip swelling   Bactrim [sulfamethoxazole-trimethoprim] Swelling, Rash        Medication List     STOP taking these medications    amoxicillin 500 MG capsule Commonly known as: AMOXIL       TAKE these medications      Indication  hydrOXYzine 25 MG tablet Commonly known as: ATARAX Take 1 tablet (25 mg total) by mouth every 6 (six) hours as needed (anxiety/agitation or CIWA < or = 10).  Indication: Feeling Anxious       PATIENTS CONDITION AT DISCHARGE:  Stable  TOBACCO CESSATION SCREENING  Patient was screened and counselled on smoking cessation at time of discharge.   PRESCRIPTION ARE LOCATED:  On Chart  DISCHARGE INSTRUCTIONS:  1. Diet: Cardiac  2. Activity: As tolerated   3. Take medications as prescribed and not to make any changes without first consulting with the outpatient provider.  4. Patient was advised to avoid  any illicit drugs or alcohol due to negative impact on physical and mental health.  5. Patient should keep all follow up appointments.  TIME SPENT ON DISCHARGE: Over 35 minutes were spent on this patients discharge including a face to face encounter, patient counseling and preparation of discharge materials.   Signed: Lewanda Rife, MD

## 2023-05-06 ENCOUNTER — Other Ambulatory Visit: Payer: Self-pay

## 2023-05-06 ENCOUNTER — Emergency Department (HOSPITAL_COMMUNITY)
Admission: EM | Admit: 2023-05-06 | Discharge: 2023-05-06 | Disposition: A | Discharge status: Home / Self Care | Payer: MEDICAID | Attending: Emergency Medicine | Admitting: Emergency Medicine

## 2023-05-06 DIAGNOSIS — I110 Hypertensive heart disease with heart failure: Secondary | ICD-10-CM | POA: Diagnosis not present

## 2023-05-06 DIAGNOSIS — F10929 Alcohol use, unspecified with intoxication, unspecified: Secondary | ICD-10-CM | POA: Diagnosis not present

## 2023-05-06 DIAGNOSIS — Y906 Blood alcohol level of 120-199 mg/100 ml: Secondary | ICD-10-CM | POA: Diagnosis not present

## 2023-05-06 DIAGNOSIS — F10129 Alcohol abuse with intoxication, unspecified: Secondary | ICD-10-CM | POA: Diagnosis present

## 2023-05-06 DIAGNOSIS — R45851 Suicidal ideations: Secondary | ICD-10-CM | POA: Diagnosis not present

## 2023-05-06 DIAGNOSIS — F17210 Nicotine dependence, cigarettes, uncomplicated: Secondary | ICD-10-CM | POA: Insufficient documentation

## 2023-05-06 DIAGNOSIS — F32A Depression, unspecified: Secondary | ICD-10-CM

## 2023-05-06 DIAGNOSIS — I5020 Unspecified systolic (congestive) heart failure: Secondary | ICD-10-CM | POA: Insufficient documentation

## 2023-05-06 LAB — COMPREHENSIVE METABOLIC PANEL
ALT: 48 U/L — ABNORMAL HIGH (ref 0–44)
AST: 42 U/L — ABNORMAL HIGH (ref 15–41)
Albumin: 3.8 g/dL (ref 3.5–5.0)
Alkaline Phosphatase: 53 U/L (ref 38–126)
Anion gap: 10 (ref 5–15)
BUN: 14 mg/dL (ref 6–20)
CO2: 19 mmol/L — ABNORMAL LOW (ref 22–32)
Calcium: 8.5 mg/dL — ABNORMAL LOW (ref 8.9–10.3)
Chloride: 106 mmol/L (ref 98–111)
Creatinine, Ser: 1.02 mg/dL (ref 0.61–1.24)
GFR, Estimated: >60 mL/min (ref >60)
Glucose, Bld: 97 mg/dL (ref 70–99)
Potassium: 3.4 mmol/L — ABNORMAL LOW (ref 3.5–5.1)
Sodium: 135 mmol/L (ref 135–145)
Total Bilirubin: 0.9 mg/dL (ref 0.3–1.2)
Total Protein: 9.1 g/dL — ABNORMAL HIGH (ref 6.5–8.1)

## 2023-05-06 LAB — CBC
HCT: 40.3 % (ref 39.0–52.0)
Hemoglobin: 13.5 g/dL (ref 13.0–17.0)
MCH: 31.4 pg (ref 26.0–34.0)
MCHC: 33.5 g/dL (ref 30.0–36.0)
MCV: 93.7 fL (ref 80.0–100.0)
Platelets: 280 10*3/uL (ref 150–400)
RBC: 4.3 MIL/uL (ref 4.22–5.81)
RDW: 14.2 % (ref 11.5–15.5)
WBC: 3.6 10*3/uL — ABNORMAL LOW (ref 4.0–10.5)
nRBC: 0 % (ref 0.0–0.2)

## 2023-05-06 LAB — RAPID URINE DRUG SCREEN, HOSP PERFORMED
Amphetamines: NOT DETECTED
Barbiturates: NOT DETECTED
Benzodiazepines: NOT DETECTED
Cocaine: NOT DETECTED
Opiates: NOT DETECTED
Tetrahydrocannabinol: NOT DETECTED

## 2023-05-06 LAB — ETHANOL: Alcohol, Ethyl (B): 146 mg/dL — ABNORMAL HIGH (ref <10)

## 2023-05-06 LAB — SALICYLATE LEVEL: Salicylate Lvl: <7 mg/dL — ABNORMAL LOW (ref 7.0–30.0)

## 2023-05-06 LAB — ACETAMINOPHEN LEVEL: Acetaminophen (Tylenol), Serum: <10 ug/mL — ABNORMAL LOW (ref 10–30)

## 2023-05-06 MED ORDER — LORAZEPAM 1 MG PO TABS: 0.0000 mg | ORAL_TABLET | Freq: Two times a day (BID) | ORAL | Status: DC

## 2023-05-06 MED ORDER — NICOTINE 21 MG/24HR TD PT24: 21.0000 mg | MEDICATED_PATCH | Freq: Every day | TRANSDERMAL | Status: DC

## 2023-05-06 MED ORDER — LORAZEPAM 1 MG PO TABS: 2.0000 mg | ORAL_TABLET | Freq: Four times a day (QID) | ORAL | Status: DC

## 2023-05-06 MED ORDER — LORAZEPAM 1 MG PO TABS: 0.0000 mg | ORAL_TABLET | Freq: Four times a day (QID) | ORAL | Status: DC

## 2023-05-06 MED ORDER — THIAMINE HCL 100 MG/ML IJ SOLN: 100.0000 mg | Freq: Every day | INTRAMUSCULAR | Status: DC

## 2023-05-06 MED ORDER — THIAMINE MONONITRATE 100 MG PO TABS: 100.0000 mg | ORAL_TABLET | Freq: Every day | ORAL | Status: DC

## 2023-05-06 MED ORDER — ONDANSETRON HCL 4 MG PO TABS: 4.0000 mg | ORAL_TABLET | Freq: Three times a day (TID) | ORAL | Status: DC | PRN

## 2023-05-06 MED ORDER — LORAZEPAM 1 MG PO TABS: 2.0000 mg | ORAL_TABLET | Freq: Two times a day (BID) | ORAL | Status: DC

## 2023-05-06 NOTE — ED Notes (Signed)
Told patient we need a urine specimen and patient said he is not able to provide a specimen right now.

## 2023-05-06 NOTE — ED Triage Notes (Signed)
Pt reports SI ongoing for the last several months. Pt reports heavy ETOH tonight and appears intoxicated, falling asleep in conversation. Pt reports he has a plan to shoot himself and he needs help. Denying hallucinations.

## 2023-05-06 NOTE — ED Notes (Signed)
Pt changed into hospital scrubs and belongings placed in bag. Pt refusing to give urine sample in RR.

## 2023-05-06 NOTE — ED Notes (Signed)
Pt belongings placed in one belongings bag with black cell phone, black shorts, tshirt and one pair of shoes. Pt belongings placed in triage belongings cabinet.

## 2023-05-06 NOTE — ED Provider Notes (Addendum)
[place] DE[facility]MENT AT [facility] Provider Note   CSN: [id number] Arrival date & time: 05/06/23  7829     History  Chief Complaint  Patient presents with   Suicidal   Alcohol Intoxication    William Powers is a 42 y.o. male.  42 year old male who presents with suicidal ideations with plan.  He denies any active attempt.  Does have history of suicide attempt in the past by drinking bleach.  He drinks alcohol daily states about a pint.  Denies responding to any internal stimuli.  Does have history depression but does not take medications without currently       Home Medications Prior to Admission medications   Medication Sig Start Date End Date Taking? Authorizing Provider  hydrOXYzine (ATARAX) 25 MG tablet Take 1 tablet (25 mg total) by mouth every 6 (six) hours as needed (anxiety/agitation or CIWA < or = 10). Patient not taking: Reported on 05/06/2023 05/02/23   Lewanda Rife, MD  Potassium Chloride ER 20 MEQ TBCR Take 20 mEq by mouth daily at 6 (six) AM. Patient not taking: No sig reported 08/07/21 09/05/21  Bobette Mo, MD      Allergies    Bee venom, Zestril [lisinopril], and Bactrim [sulfamethoxazole-trimethoprim]    Review of Systems   Review of Systems  All other systems reviewed and are negative.   Physical Exam Updated Vital Signs BP 129/73 (BP Location: Right Arm)   Pulse 94   Temp 97.7 F (36.5 C) (Oral)   Resp 18   SpO2 100%  Physical Exam Vitals and nursing note reviewed.  Constitutional:      General: He is not in acute distress.    Appearance: Normal appearance. He is well-developed. He is not toxic-appearing.  HENT:     Head: Normocephalic and atraumatic.  Eyes:     General: Lids are normal.     Conjunctiva/sclera: Conjunctivae normal.     Pupils: Pupils are equal, round, and reactive to light.  Neck:     Thyroid: No thyroid mass.     Trachea: No tracheal deviation.  Cardiovascular:     Rate and  Rhythm: Normal rate and regular rhythm.     Heart sounds: Normal heart sounds. No murmur heard.    No gallop.  Pulmonary:     Effort: Pulmonary effort is normal. No respiratory distress.     Breath sounds: Normal breath sounds. No stridor. No decreased breath sounds, wheezing, rhonchi or rales.  Abdominal:     General: There is no distension.     Palpations: Abdomen is soft.     Tenderness: There is no abdominal tenderness. There is no rebound.  Musculoskeletal:        General: No tenderness. Normal range of motion.     Cervical back: Normal range of motion and neck supple.  Skin:    General: Skin is warm and dry.     Findings: No abrasion or rash.  Neurological:     Mental Status: He is alert and oriented to person, place, and time. Mental status is at baseline.     GCS: GCS eye subscore is 4. GCS verbal subscore is 5. GCS motor subscore is 6.     Cranial Nerves: Cranial nerves are intact. No cranial nerve deficit.     Sensory: No sensory deficit.     Motor: Motor function is intact.  Psychiatric:        Attention and Perception: Attention normal.  Mood and Affect: Affect is flat.        Speech: Speech is delayed.        Behavior: Behavior is slowed.        Thought Content: Thought content includes suicidal ideation. Thought content includes suicidal plan.     ED Results / Procedures / Treatments   Labs (all labs ordered are listed, but only abnormal results are displayed) Labs Reviewed  COMPREHENSIVE METABOLIC PANEL - Abnormal; Notable for the following components:      Result Value   Potassium 3.4 (*)    CO2 19 (*)    Calcium 8.5 (*)    Total Protein 9.1 (*)    AST 42 (*)    ALT 48 (*)    All other components within normal limits  ETHANOL - Abnormal; Notable for the following components:   Alcohol, Ethyl (B) 146 (*)    All other components within normal limits  SALICYLATE LEVEL - Abnormal; Notable for the following components:   Salicylate Lvl <7.0 (*)    All  other components within normal limits  ACETAMINOPHEN LEVEL - Abnormal; Notable for the following components:   Acetaminophen (Tylenol), Serum <10 (*)    All other components within normal limits  CBC - Abnormal; Notable for the following components:   WBC 3.6 (*)    All other components within normal limits  RAPID URINE DRUG SCREEN, HOSP PERFORMED    EKG None  Radiology No results found.  Procedures Procedures    Medications Ordered in ED Medications - No data to display  ED Course/ Medical Decision Making/ A&P                             Medical Decision Making Amount and/or Complexity of Data Reviewed Labs: ordered.  Risk OTC drugs. Prescription drug management.   Patient's labs reviewed and he is medically clear at this time for psychiatric disposition.  Will consult behavioral team.  Will place CIWA protocol  10:47 AM\ Patient cleared for discharge by psychiatry      Final Clinical Impression(s) / ED Diagnoses Final diagnoses:  None    Rx / DC Orders ED Discharge Orders     None         Lorre Nick, MD 05/06/23 Loralie Champagne    Lorre Nick, MD 05/06/23 1047

## 2023-05-06 NOTE — ED Notes (Signed)
Labeled specimen cup given to pt for U/A collection per MD order. ENMiles 

## 2023-05-06 NOTE — Consult Note (Signed)
[facility] ED ASSESSMENT   Reason for Consult:  Psych Consult Referring Physician:  Lorre Nick Patient Identification: William Powers MRN:  [id number] ED Chief Complaint: Alcohol abuse with intoxication [facility])  Diagnosis:  Principal Problem:   Alcohol abuse with intoxication [facility])   ED Assessment Time Calculation: Start Time: 0710 Stop Time: 0805 Total Time in Minutes (Assessment Completion): 55  HPI:  William Powers is a 42 y.o. male patient arrived voluntarily with suicidal ideations with a plan in the setting of alcohol intoxication. Per chart review, patient has a history of congestive heart failure, HIV, polysubstance abuse, depression and a mood disorder.  Subjective:   William Powers is a 42 y.o. male patient admitted with suicidal ideation with a plan in the setting of alcohol intoxication.  On arrival, patient stated he had a plan to shoot himself.  Per chart review, patient has a history of congestive heart failure, HIV, polysubstance abuse, depression and a mood disorder.  Upon assessment this morning, patient is found resting comfortably in bed with an attendant present.  When asked what brought him to the emergency room, patient stated "I just had thoughts last night." When asked what kind of thoughts patient clarified "suicidal ones."  Patient denies current suicidal ideation.  He clearly states he does not want to kill himself.  He states he wants detox services and substance abuse treatment.  Patient states he has never gone through detox and has never had substance abuse treatment.  He states he has never gone to AA or had a period of sobriety.  He denies any previous psychiatric diagnoses.  He says he drinks alcohol daily "just because I want to" and denies needing to drink to keep withdraw symptoms at bay.  When asked about support, patient states he has no support and no family.  There is no one that can be called.  Patient states his only medical condition is congestive  heart failure for which he receives disability.  He denies taking any medications, then says he takes ativan.  When asked why he takes ativan, he states "it's for anxiety."  When asked if he has a diagnosis of anxiety and who prescribes ativan for him, he says "No one prescribes it. That is just what they give me in the hospital and I want some."  Patient asks what will happen next and is told that since he said he wants treatment, he will be referred for detox and substance abuse treatment.  Patient then says that he doesn't want to do that.  He says "I have a place in [place] I can just walk in and I want to go there." Patient is requesting he be discharged. Patient states he does not have a gun.  He [place], but that his regular address is simply "[place]."        Patient is alert and oriented X 4.  He is coherent.  He states he is sleeping well and he is eating well. Patient emphatically denies suicidal ideation, homicidal ideation, auditory and visual hallucinations.  Patient drinks and smokes "4 black and milds" per day.  Per chart review, patient's suicidal ideation regularly appears when patient is intoxicated and without domicile and he subsequently recants, denying being suicidal, once he has rested and become sober.  Patient is continuing this same pattern.    Patient is not a risk to himself or others.  He does not meet criteria for inpatient psychiatric hospitalization and in not  interested in inpatient substance use treatment in [place].         Past Psychiatric History: Many emergency room visits for substance abuse, suicidal ideation, depression   Risk to Self or Others: Is the patient at risk to self? No Has the patient been a risk to self in the past 6 months? No Has the patient been a risk to self within the distant past? No Is the patient a risk to others? No Has the patient been a risk to others in the past 6 months? No Has the patient been a risk to others  within the distant past? No  [place] Scale:  Flowsheet Row ED from 05/06/2023 in [facility] Emergency Department at [facility] Most recent reading at 05/06/2023  2:30 AM Admission (Discharged) from 05/01/2023 in [facility] INPATIENT BEHAVIORAL MEDICINE Most recent reading at 05/01/2023 10:54 AM ED from 05/01/2023 in [facility] Emergency Department at [facility] Most recent reading at 05/01/2023 12:33 AM  C-SSRS RISK CATEGORY High Risk No Risk High Risk      Substance Abuse:   Long history of substance abuse including alcohol and cocaine.  Past Medical History:  Past Medical History:  Diagnosis Date   CHF (congestive heart failure) (HCC)    Heart failure (HCC)    HFrEF (heart failure with reduced ejection fraction) (HCC)    15% EF as of 2019   HIV (human immunodeficiency virus infection) (HCC)    Hypertension    Polysubstance abuse (HCC)    Pulmonary embolism (HCC)    Single subsegmental pulmonary embolism without acute cor pulmonale (HCC) 07/23/2021    Past Surgical History:  Procedure Laterality Date   HERNIA REPAIR     Family History:  Family History  Problem Relation Age of Onset   Lupus Mother    HIV Mother    HIV Father    Family Psychiatric  History: Patient denies Social History:  Social History   Substance and Sexual Activity  Alcohol Use Yes   Alcohol/week: 5.0 standard drinks of alcohol   Types: 5 Cans of beer per week   Comment: Last drink 2 hours PTA     Social History   Substance and Sexual Activity  Drug Use Yes   Types: Marijuana, Cocaine   Comment: Fentanyl    Social History   Socioeconomic History   Marital status: Divorced    Spouse name: Not on file   Number of children: Not on file   Years of education: Not on file   Highest education level: Not on file  Occupational History   Not on file  Tobacco Use   Smoking status: Some Days    Current packs/day: 0.50    Types: Cigarettes, Cigars   Smokeless tobacco: Never  Vaping  Use   Vaping status: Never Used  Substance and Sexual Activity   Alcohol use: Yes    Alcohol/week: 5.0 standard drinks of alcohol    Types: 5 Cans of beer per week    Comment: Last drink 2 hours PTA   Drug use: Yes    Types: Marijuana, Cocaine    Comment: Fentanyl   Sexual activity: Not Currently  Other Topics Concern   Not on file  Social History Narrative   Not on file   Social Determinants of Health   Financial Resource Strain: Low Risk  (02/01/2022)   Received from [facility], [facility]   Overall Financial Resource Strain (CARDIA)    Difficulty of Paying Living  Expenses: Not hard at all  Food Insecurity: No Food Insecurity (05/01/2023)   Hunger Vital Sign    Worried About Running Out of Food in the Last Year: Never true    Ran Out of Food in the Last Year: Never true  Recent Concern: Food Insecurity - High Risk (03/24/2023)   Received from [facility], [facility]   Food Insecurity    Within the past 12 months, did the food you bought just not last and you didn't have money to get more?: Yes    Within the past 12 months, did you worry that your food would run out before you got money to buy more?: Yes  Transportation Needs: No Transportation Needs (05/01/2023)   PRAPARE - Administrator, Civil Service (Medical): No    Lack of Transportation (Non-Medical): No  Recent Concern: Transportation Needs - High Risk (03/24/2023)   Received from [facility], [facility]   Transportation Needs    Within the past 12 months, has a lack of transportation kept you from medical appointments or from doing things needed for daily living?: Yes  Physical Activity: Sufficiently Active (02/01/2022)   Received from [facility], [facility]   Exercise Vital Sign    Days of Exercise per Week: 4 days    Minutes of Exercise per Session: 40 min  Stress: Stress Concern Present (02/01/2022)   Received from [facility], [facility] Fall of Occupational Health - Occupational Stress Questionnaire    Feeling of Stress : Rather much  Social Connections: Socially Isolated (02/01/2022)   Received from [facility], [facility]   Social Connection and Isolation Panel [NHANES]    Frequency of Communication with Friends and Family: More than three times a week    Frequency of Social Gatherings with Friends and Family: Not on file    Attends Religious Services: Never    Active Member of Clubs or Organizations: Not on file    Attends Banker Meetings: Never    Marital Status: Divorced   Additional Social History:    Allergies:   Allergies  Allergen Reactions   Bee Venom Anaphylaxis   Zestril [Lisinopril] Swelling    Lip swelling    Bactrim [Sulfamethoxazole-Trimethoprim] Swelling and Rash    Labs:  Results for orders placed or performed during the hospital encounter of 05/06/23 (from the past 48 hour(s))  Comprehensive metabolic panel     Status: Abnormal   Collection Time: 05/06/23  2:34 AM  Result Value Ref Range   Sodium 135 135 - 145 mmol/L   Potassium 3.4 (L) 3.5 - 5.1 mmol/L   Chloride 106 98 - 111 mmol/L   CO2 19 (L) 22 - 32 mmol/L   Glucose, Bld 97 70 - 99 mg/dL    Comment: Glucose reference range applies only to samples taken after fasting for at least 8 hours.   BUN 14 6 - 20 mg/dL   Creatinine, Ser 9.81 0.61 - 1.24 mg/dL   Calcium 8.5 (L) 8.9 - 10.3 mg/dL   Total Protein 9.1 (H) 6.5 - 8.1 g/dL   Albumin 3.8 3.5 - 5.0 g/dL   AST 42 (H) 15 - 41 U/L   ALT 48 (H) 0 - 44 U/L   Alkaline Phosphatase 53 38 - 126 U/L   Total Bilirubin 0.9 0.3 - 1.2 mg/dL   GFR, Estimated >19 >14 mL/min  Comment: (NOTE) Calculated using the CKD-EPI Creatinine Equation (2021)    Anion gap 10 5 - 15    Comment: Performed at [facility], [address], [place], [place] [address]  Ethanol     Status: Abnormal   Collection Time: 05/06/23  2:34  AM  Result Value Ref Range   Alcohol, Ethyl (B) 146 (H) <10 mg/dL    Comment: (NOTE) Lowest detectable limit for serum alcohol is 10 mg/dL.  For medical purposes only. Performed at [facility], [address], [place], [place] [address]   Salicylate level     Status: Abnormal   Collection Time: 05/06/23  2:34 AM  Result Value Ref Range   Salicylate Lvl <7.0 (L) 7.0 - 30.0 mg/dL    Comment: Performed at [facility], [address], [place], [place] [address]  Acetaminophen level     Status: Abnormal   Collection Time: 05/06/23  2:34 AM  Result Value Ref Range   Acetaminophen (Tylenol), Serum <10 (L) 10 - 30 ug/mL    Comment: (NOTE) Therapeutic concentrations vary significantly. A range of 10-30 ug/mL  may be an effective concentration for many patients. However, some  are best treated at concentrations outside of this range. Acetaminophen concentrations >150 ug/mL at 4 hours after ingestion  and >50 ug/mL at 12 hours after ingestion are often associated with  toxic reactions.  Performed at [facility], [address], [place], [place] [address]   cbc     Status: Abnormal   Collection Time: 05/06/23  2:34 AM  Result Value Ref Range   WBC 3.6 (L) 4.0 - 10.5 K/uL   RBC 4.30 4.22 - 5.81 MIL/uL   Hemoglobin 13.5 13.0 - 17.0 g/dL   HCT 25.9 56.3 - 87.5 %   MCV 93.7 80.0 - 100.0 fL   MCH 31.4 26.0 - 34.0 pg   MCHC 33.5 30.0 - 36.0 g/dL   RDW 64.3 32.9 - 51.8 %   Platelets 280 150 - 400 K/uL   nRBC 0.0 0.0 - 0.2 %    Comment: Performed at [facility], [address] [place], [place] [address]  Rapid urine drug screen (hospital performed)     Status: None   Collection Time: 05/06/23  8:12 AM  Result Value Ref Range   Opiates NONE DETECTED NONE DETECTED   Cocaine NONE DETECTED NONE DETECTED   Benzodiazepines NONE DETECTED NONE DETECTED   Amphetamines NONE DETECTED NONE DETECTED    Tetrahydrocannabinol NONE DETECTED NONE DETECTED   Barbiturates NONE DETECTED NONE DETECTED    Comment: (NOTE) DRUG SCREEN FOR MEDICAL PURPOSES ONLY.  IF CONFIRMATION IS NEEDED FOR ANY PURPOSE, NOTIFY LAB WITHIN 5 DAYS.  LOWEST DETECTABLE LIMITS FOR URINE DRUG SCREEN Drug Class                     Cutoff (ng/mL) Amphetamine and metabolites    1000 Barbiturate and metabolites    200 Benzodiazepine                 200 Opiates and metabolites        300 Cocaine and metabolites        300 THC                            50 Performed at [facility], [address], [place], [place] [address]  Current Facility-Administered Medications  Medication Dose Route Frequency Provider Last Rate Last Admin   LORazepam (ATIVAN) tablet 2 mg  2 mg Oral Q6H Lorre Nick, MD       Or   LORazepam (ATIVAN) tablet 0-4 mg  0-4 mg Oral Q6H Lorre Nick, MD       [START ON 05/08/2023] LORazepam (ATIVAN) tablet 2 mg  2 mg Oral Q12H Lorre Nick, MD       Or   Melene Muller ON 05/08/2023] LORazepam (ATIVAN) tablet 0-4 mg  0-4 mg Oral Q12H Lorre Nick, MD       nicotine (NICODERM CQ - dosed in mg/24 hours) patch 21 mg  21 mg Transdermal Daily Lorre Nick, MD       ondansetron [facility]) tablet 4 mg  4 mg Oral Q8H PRN Lorre Nick, MD       thiamine (VITAMIN B1) tablet 100 mg  100 mg Oral Daily Lorre Nick, MD       Or   thiamine (VITAMIN B1) injection 100 mg  100 mg Intravenous Daily Lorre Nick, MD       Current Outpatient Medications  Medication Sig Dispense Refill   hydrOXYzine (ATARAX) 25 MG tablet Take 1 tablet (25 mg total) by mouth every 6 (six) hours as needed (anxiety/agitation or CIWA < or = 10). (Patient not taking: Reported on 05/06/2023) 30 tablet 0    Musculoskeletal: Strength & Muscle Tone: within normal limits Gait & Station: normal Patient leans: N/A   Psychiatric Specialty Exam: Presentation  General Appearance:  Disheveled  Eye  Contact: Minimal  Speech: Clear and Coherent  Speech Volume: Normal  Handedness: Right   Mood and Affect  Mood: Euthymic  Affect: Appropriate   Thought Process  Thought Processes: Coherent  Descriptions of Associations:Circumstantial  Orientation:Full (Time, Place and Person)  Thought Content:WDL  History of Schizophrenia/Schizoaffective disorder:No  Duration of Psychotic Symptoms:N/A  Hallucinations:Hallucinations: None  Ideas of Reference:None  Suicidal Thoughts:Suicidal Thoughts: No  Homicidal Thoughts:Homicidal Thoughts: No   Sensorium  Memory: Immediate Good; Recent Good; Remote Good  Judgment: Impaired  Insight: Lacking   Executive Functions  Concentration: Fair  Attention Span: Fair  Recall: Good  Fund of Knowledge: Fair  Language: Fair   Psychomotor Activity  Psychomotor Activity: Psychomotor Activity: Normal   Assets  Assets: Desire for Improvement; Leisure Time    Sleep  Sleep: Sleep: Good   Physical Exam: Physical Exam Vitals and nursing note reviewed. Exam conducted with a chaperone present.  Eyes:     Pupils: Pupils are equal, round, and reactive to light.  Pulmonary:     Effort: Pulmonary effort is normal.  Neurological:     Mental Status: He is alert and oriented to person, place, and time.    Review of Systems  Constitutional:        Patient denies  Respiratory:         Patient denies  Cardiovascular:        Patient denies  Musculoskeletal:        Patient denies  Psychiatric/Behavioral:  Positive for substance abuse.   All other systems reviewed and are negative.  Blood pressure 129/73, pulse 94, temperature 97.7 F (36.5 C), temperature source Oral, resp. rate 18, SpO2 100%. There is no height or weight on file to calculate BMI.  Medical Decision Making: Patient case reviewed and discussed with Dr. Daleen Bo.  Patient does not meet criteria for inpatient psychiatric admission.  Patient is  psych cleared.  Problem 1: Alcohol abuse with  Intoxication -provide information for outpatient services -CIWA protocol is ordered   Disposition: No evidence of imminent risk to self or others at present.   Patient does not meet criteria for psychiatric inpatient admission.  Thomes Lolling, NP 05/06/2023 10:42 AM

## 2023-05-06 NOTE — ED Notes (Signed)
 Pt wanded by security. 

## 2024-08-31 NOTE — ED Provider Notes (Signed)
 Care of this patient was signed out to me at 7 AM.  Patient was held overnight and seen and evaluated by psychiatry.  They have deemed the patient appropriate for outpatient treatment of their underlying psychiatric condition.  Per psychiatry recommendations, I will discharge patient to continue outpatient psychiatric care.  I appreciate their input in this case.  Sylvan Cart, MD   Cart Sylvan Ade, MD 08/31/24 [phone]

## 2024-08-31 NOTE — ED Provider Notes (Signed)
 [facility] EMERGENCY DEPARTMENT   Interpreter not needed for encounter  ED Provider Note History   Chief Complaint  Patient presents with  . Alcohol Intoxication   History of Present Illness  History of Present Illness 43 year old male with history of homelessness, HIV, CHF, alcohol abuse, presents requesting detox from alcohol and also reporting suicidal ideations.  [facility] and he was advised to come here.  Denies history of alcohol withdrawal seizures or hallucinations.  States when he stops drinking he typically gets shaky.  He states he has a plan to jump off of a bridge.  He is not having thoughts of harming anyone else.  Denies auditory or visual hallucinations.    Past Medical History:  Diagnosis Date  . CHF (congestive heart failure) (CMS/HHS-HCC)   . HIV (human immunodeficiency virus infection) (CMS/HHS-HCC)   . Homelessness   . PCP (pneumocystis carinii pneumonia) (CMS/HHS-HCC)   . Polysubstance abuse (CMS-HCC)   . Shock, cardiogenic (CMS/HHS-HCC)    Past Surgical History:  Procedure Laterality Date  . HERNIA REPAIR     pt states he was around 43 yrs old   Family History  Problem Relation Age of Onset  . Lupus Mother   . HIV Mother   . HIV Father    Social History   Socioeconomic History  . Marital status: Legally Separated  Tobacco Use  . Smoking status: Every Day    Types: Cigars, Cigarettes    Start date: 54  . Smokeless tobacco: Never  . Tobacco comments:    Smoked 5 black and mild's per day prior to 2019. Now smoking 1 black and mild per week.     06/15/2024 - Stopped smoking 05/30/24  Vaping Use  . Vaping status: Never Used  Substance and Sexual Activity  . Alcohol use: Yes    Alcohol/week: 2.0 standard drinks of alcohol    Types: 2 Standard drinks or equivalent per week    Comment: none since 09/11/18  . Drug use: Not Currently    Types: Marijuana, Cocaine    Comment: Stopped on 05/30/24  .  Sexual activity: Defer    Partners: Female    Birth control/protection: None, Condom  Social History Narrative   ** Merged History Encounter **       Baseline/Social: Lives with aunt, does not use a walker/cane at baseline. Formerly worked at [employer] but has been on disability since contracting HIV in 2019. He manages medications, reports medication compliance since discharge on 06/06/24, but historical   ly has not been compliant with anti-retroviral therapy. Smoked 1 Black and Mild cigar for 20 years for total 20 PY. Drinks EtOH unclear amount (does not recall), recreational drug use with marijuana and cocaine. No usage of EtOH/tobacco/drugs since 8/11/   25.     Social Drivers of Health   Financial Resource Strain: Medium Risk (08/12/2024)   Overall Financial Resource Strain (CARDIA)   . Difficulty of Paying Living Expenses: Somewhat hard  Food Insecurity: Low Risk (08/20/2024)   Received from [facility]   Food Insecurity   . Within the past 12 months, did the food you bought just not last and you didn't have money to get more?: No   . Within the past 12 months, did you worry that your food would run out before you got money to buy more?: No  Transportation Needs: Low Risk (08/20/2024)   Received from [facility]   Transportation  Needs   . Within the past 12 months, has a lack of transportation kept you from medical appointments or from doing things needed for daily living?: No  Physical Activity: Sufficiently Active (02/01/2022)   Received from [facility]   Exercise Vital Sign   . On average, how many days per week do you engage in moderate to strenuous exercise (like a brisk walk)?: 4 days   . On average, how many minutes do you engage in exercise at this level?: 40 min  Stress: Stress Concern Present (02/01/2022)   Received from [facility] - Occupational Stress Questionnaire   . Feeling of Stress :  Rather much  Social Connections: Socially Isolated (02/01/2022)   Received from [facility]   Social Connection and Isolation Panel   . In a typical week, how many times do you talk on the phone with family, friends, or neighbors?: More than three times a week   . How often do you attend church or religious services?: Never   . How often do you attend meetings of the clubs or organizations you belong to?: Never   . Are you married, widowed, divorced, separated, never married, or living with a partner?: Divorced  Housing Stability: High Risk (08/20/2024)   Received from [facility]   Housing Stability   . Within the past 12 months, have you ever stayed: outside, in a car, in a tent, in an overnight shelter, or temporarily in someone else's home (i.e. couch-surfing)?: Yes   . Are you worried about losing your housing? : Yes   Review of Systems  Constitutional:  Negative for chills and fever.  HENT:  Negative for congestion and rhinorrhea.   Respiratory:  Negative for cough, chest tightness and shortness of breath.   Cardiovascular:  Negative for chest pain and leg swelling.  Gastrointestinal:  Negative for abdominal pain, constipation, diarrhea, nausea and vomiting.  Genitourinary:  Negative for dysuria and hematuria.  Musculoskeletal:  Negative for arthralgias and myalgias.  Skin:  Negative for color change and rash.  Neurological:  Negative for syncope and headaches.  Psychiatric/Behavioral:  Positive for suicidal ideas. Negative for confusion.   All other systems reviewed and are negative.   Physical Exam  BP 132/81 (BP Location: Right upper arm, Patient Position: Sitting)   Pulse 91   Temp 36.4 C (97.5 F) (Oral)   Resp 15   Ht 190.5 cm (6' 3)   Wt 96.2 kg (212 lb)   SpO2 99%   BMI 26.50 kg/m  Physical Exam Vitals and nursing note reviewed.  Constitutional:      General: He is not in acute distress.    Appearance: He is well-developed. He is not  diaphoretic.  HENT:     Head: Normocephalic and atraumatic. No raccoon eyes or abrasion.     Mouth/Throat:     Mouth: Mucous membranes are not dry.  Eyes:     General:        Right eye: No discharge.        Left eye: No discharge.     Conjunctiva/sclera: Conjunctivae normal.  Cardiovascular:     Rate and Rhythm: Normal rate and regular rhythm.     Heart sounds: Normal heart sounds. No murmur heard. Pulmonary:     Effort: Pulmonary effort is normal. No respiratory distress.     Breath sounds: Normal breath sounds.  Abdominal:     Palpations: Abdomen is soft.  Abdomen is not rigid.     Tenderness: There is no abdominal tenderness. There is no guarding.  Musculoskeletal:     Cervical back: Normal range of motion and neck supple. No rigidity. Normal range of motion.     Right lower leg: No edema.     Left lower leg: No edema.  Skin:    General: Skin is warm and dry.     Findings: No rash.  Neurological:     Mental Status: He is alert.     Sensory: No sensory deficit.  Psychiatric:        Mood and Affect: Mood normal.        Behavior: Behavior normal.       Procedures  Procedures TIP (no need to delete)  Delete the Procedures section if patient does not have a procedure.  Medical Decision Making and ED Course  Given the patient's initial exam and evaluation, the following diagnostic evaluation has been ordered. The patient will be placed in the appropriate treatment space, once one is available to complete the evaluation and treatment. I have discussed the plan of care with the patient/representative and have advised that the plan of care may change based upon the results of diagnostic testing. The patient/representative has been asked to not leave prior to the completion of their evaluation. I have advised that if they leave prior to the completion of their evaluation, they will be leaving against medical advice.   Assessment & Plan 43 year old male with history of HIV, CHF,  homelessness, alcohol abuse, presents with suicidal ideations and requesting detox from alcohol.  Denies drug use.  No medical complaints.  No history of complicated alcohol withdrawal.  Labs reviewed.  Cleared.  Will start CIWA protocol. Will consult psych.   [facility] ED Behavioral Patient Sign-out  Observation Status: Maintain close observation while patient in the medical ED as per ED observation policy, but transition to q15 minute checks if moved to the [facility] ED.  Chief Complaint:  Chief Complaint  Patient presents with  . Alcohol Intoxication    Pending diagnostics: UDS Diet ordered:  [x] Yes [] No Home Medications ordered: [x] Yes  [] No  Psychiatric Medications Ordered:  [] Yes  [x] No  BHED Eligibility BP 132/81 (BP Location: Right upper arm, Patient Position: Sitting)   Pulse 91   Temp 36.4 C (97.5 F) (Oral)   Resp 15   Ht 190.5 cm (6' 3)   Wt 96.2 kg (212 lb)   SpO2 99%   BMI 26.50 kg/m  [x]  Medically Cleared  [x]  No clinical concern for COVID [x]  Performs ADLs [x]  Physician Hold?  Disposition: per psych   Medical Complexity:  [x] New and requires workup. [] New and does not require workup. [x] Pertinent labs & imaging results were reviewed by me and considered in my decision making. [] I obtained history from someone other than the patient. [x] I reviewed previous medical records. [x] I independently visualized image(s), tracing(s), and/or specimen(s). [] I discussed the patient with another provider.     Results: EKG:   LABS: Labs Reviewed  COMPREHENSIVE METABOLIC PANEL (CMP) - Abnormal; Notable for the following components:      Result Value   Sodium 134 (*)    Bilirubin, Total 0.3 (*)    Protein, Total 8.3 (*)    All other components within normal limits  COMPLETE BLOOD COUNT (CBC) WITH DIFFERENTIAL - Abnormal; Notable for the following components:   Hemoglobin 13.2 (*)    MCV (Mean Corpuscular Volume) 100 (*)    RBC (Red  Blood Cell Count) 4.04 (*)     All other components within normal limits  TOXICOLOGY(DRUG) SCREEN, SERUM - Abnormal; Notable for the following components:   Ethanol 159 (*)    Acetaminophen  <10 (*)    Salicylate <40 (*)    All other components within normal limits  URINE DRUG SCREEN QUALITATIVE RAPID RESULTS  POC GLUCOSE    Medications: Medications  bictegravir-emtricitabine -tenofovir  alafenamide (BIKTARVY ) 50-200-25 mg tablet 1 tablet (has no administration in time range)      IMAGING: Notified that Radiology will read radiologic studies and patient will be called if change in report by radiology department. No orders to display        ED Clinical Impression  1. Alcohol abuse   2. Suicidal ideation               This note was partially written using Dragon (a voice recognition system) and may contain typographical errors missed during proofreading. This note has been created using automated tools and reviewed for accuracy by KYLE JORDAN ABSHIRE. TIP (no need to delete)  Click Refresh button to add wrap-up information to note.     Kendal Rockey Rides, MD 08/31/24 [phone]

## 2024-09-02 NOTE — ED Provider Notes (Signed)
 [facility] Emergency Department Provider Note Room: G ED 02/G 02  Medical Decision Making   Assessment & Plan  Patient presents with William Powers. is a 43 year old male who presents with suicidal thoughts.  He has experienced suicidal thoughts for one week and attempted self-harm by ingesting 'Blue Speed,' which was ineffective. He consumed alcohol today but denies drug use, including cocaine.    Differentials:  SI, HI, depression, anxiety, homeless, bipolar, psychosis, substance abuse   At this time the patient appears calm and not aggressive, However we will get a sitter and closely monitor patient for any signs of aggression to ensure the safety of staff and patient    Chart review completed   PLAN Will consult [facility] for their help with dispo planning and placement if indicated  Patient has a history of multiple Emergency Department visits for alcohol abuse, alcohol intoxication associated with suicidal ideation.  He frequently is seen in the emergency department for same complaints and ultimately discharged home  He did seem to have a hospital mission October 1625 for suicidal ideation   Progress Notes  11:19 PM Spoke with [facility]  12:45 AM [facility] evaluated patient and states that patient does not require inpatient care.  Said patient can be discharged when he is clinically sober  Will sign patient out to oncoming provider for disposition          MDM Elements Discussion of Management with other Physicians, QHP or Appropriate Source: Consultant: [facility]    I have reviewed recent and relevant previous record, including: Outpatient notes: Patient had a hospital visit August 31, 2024 for alcohol abuse and suicidal ideation   Social Drivers that significantly affected care: Substance abuse         Disposition   Clinical Impression:     Diagnosis Comment Added By Time Added   Suicidal ideation  Norleen Deward Mingle, PA-C 09/02/2024 11:30 PM   Alcohol abuse   Norleen Deward Mingle, PA-C 09/02/2024 11:30 PM      Final Disposition: Discharge  History   Chief Complaint in Triage  Patient presents with  . Palpitations  . Acute Intoxication    Patient came in BIB ambulance with chief complaint of palpitations and alcohol intoxication from [facility]. Patient is AAOx4,not in distress.Pre hospital treatment given.(Zofran  4 mg IV)    History of Present Illness William Powers. is a 43 year old male who presents with suicidal thoughts.  He has experienced suicidal thoughts for one week.  He consumed alcohol today but denies drug use, including cocaine.  Addition Historian(s): None     MEDICATIONS:  Patient's Medications  Previous Medications   BUPROPION XL (WELLBUTRIN XL) 150 MG 24 HR TABLET    Take 1 tablet (150 mg total) by mouth daily for 30 days.   HYDROXYZINE  (ATARAX ) 50 MG TABLET    Take 1 tablet (50 mg total) by mouth every 8 (eight) hours as needed for anxiety for up to 30 days.   METOPROLOL  SUCCINATE (TOPROL -XL) 50 MG 24 HR TABLET    Take 1 tablet (50 mg total) by mouth daily.   MULTIVITAMIN (THERAGRAN) 400 MCG TABLET    Take 1 tablet by mouth daily.   NICOTINE  (NICODERM CQ ) 14 MG/24 HR    Place 1 patch on the skin daily as needed for smoking cessation.   NICOTINE  POLACRILEX (NICORETTE) 2 MG GUM    Apply 2 each (4 mg total) between cheek and gums every 2 (two) hours as  needed for smoking cessation for up to 30 days.   ONDANSETRON  (ZOFRAN  ODT) 4 MG DISINTEGRATING TABLET    Dissolve 1 tablet (4 mg total) on the tongue every 8 (eight) hours as needed for nausea for up to 10 doses.   RIVAROXABAN  (XARELTO ) 20 MG TAB TABLET    Take 1 tablet (20 mg total) by mouth daily with dinner for 30 days.  Modified Medications   No medications on file    ALLERGIES: Allergies[1]  PAST MEDICAL HISTORY:  Past Medical History[2]  PAST SURGICAL HISTORY: Past Surgical History[3]  SOCIAL HISTORY:  Social History   Tobacco Use  . Smoking status: Every  Day    Types: Cigars  . Smokeless tobacco: Never  . Tobacco comments:    B&M 2x mo  Substance Use Topics  . Alcohol use: Yes    Alcohol/week: 6.0 standard drinks of alcohol    Types: 6 Cans of beer per week    Comment: pt report drinking a gallon of vodka     Social Drivers of Health with Concerns   Tobacco Use: High Risk (09/03/2024)  Alcohol Use: Alcohol Misuse (06/14/2023)  Food Insecurity: Patient Unable To Answer (09/03/2024)  Housing Stability: Patient Unable To Answer (09/03/2024)  Recent Concern: Housing Stability - High Risk (08/20/2024)  Immediate Need:  Recent Concern: Immediate Need - High Risk (08/20/2024)  Utilities: Patient Unable To Answer (09/03/2024)   FAMILY HISTORY:  Family History[4]    Physical Exam    ED Triage Vitals [09/02/24 2251]  Temp 98.2 F (36.8 C)  Temp Source Oral  Heart Rate 88  BP 116/75  Respirations 19  SpO2 100 %   Reviewed vital signs and nursing note as charted by RN.  Physical Exam   Adult Medical Physical Exam CONSTITUTIONAL: Well-appearing; well-nourished. Alert and oriented and responds appropriately to questions.  HEAD: Normocephalic; atraumatic EYES: Conjunctivae clear, sclerae non-icteric ENT: Moist mucous membranes NECK: Supple without meningismus CARD: Skin is well perfused, no JVD RESP: Normal chest excursion without splinting or tachypnea. ABD/GI:  Nondistended, nontender no rebound or guard EXT: No long bone deformities  SKIN: Normal color for age and race; warm; dry; no acute lesions noted NEURO: Alert, oriented, Moves all extremities PSYCH: The patient's mood and manner are appropriate. Grooming and personal hygiene are appropriate.      Results    I personally reviewed the results in the chart as listed below  Results      Results for orders placed or performed during the hospital encounter of 09/02/24  Urine Drug of Abuse Screen  Result Value Ref Range   Amphetamine, Urine Screen NEGATIVE     Barbiturate, Urine Screen NEGATIVE    Benzodiazepines, Urine Screen NEGATIVE    Cannabinoids, Urine Screen NEGATIVE    Cocaine, Urine Screen NEGATIVE    Methadone, Urine Screen NEGATIVE    Opiate, Urine Screen NEGATIVE    Oxycodone , Urine Screen NEGATIVE    Fentanyl, Urine Screen NEGATIVE   CMP  Result Value Ref Range   Sodium 139 136 - 145 mmol/L   Potassium 3.9 3.5 - 5.1 mmol/L   Chloride 106 98 - 107 mmol/L   CO2 22 21 - 31 mmol/L   BUN 14 7 - 25 mg/dL   Creatinine 8.98 9.29 - 1.30 mg/dL   Glucose, Random 78 70 - 199 mg/dL   Calcium, Total 8.9 8.6 - 10.3 mg/dL   Osmolality (calculated) 277 270 - 295 mOsm/kg   Anion Gap 11 4 - 12  Albumin 3.8 3.5 - 5.7 g/dL   Bilirubin, Total 0.5 0.3 - 1.0 mg/dL   Alkaline Phosphatase 44 34 - 104 IU/L   ALT 20 7 - 52 IU/L   AST 27 13 - 39 IU/L   Protein, Total 8.1 6.4 - 8.9 g/dL   Albumin/Globulin Ratio 0.9 (L) 1.2 - 2.3  TSH  Result Value Ref Range   TSH 1.00 0.45 - 5.33 uIU/mL  CBC  Result Value Ref Range   WBC 6.0 3.6 - 11.2 K/uL   RBC 4.16 4.06 - 5.63 M/uL   Hemoglobin 13.7 12.5 - 16.3 g/dL   Hematocrit 43 37 - 47 %   Mean Cell Volume 104 (H) 74 - 96 fL   Mean Cell Hemoglobin 33 24 - 33 pg   Mean Cell Hemoglobin Concentration 32 (L) 33 - 36 g/dL   RDW 86.2 87.6 - 82.9 %   Platelet Count 340 150 - 450 K/uL   Mean Platelet Volume 7.8 7.5 - 11.2 fL  Alcohol, Ethyl Level  Result Value Ref Range   Alcohol, Ethyl 287 (H) 0 - 80 mg/dL  Acetaminophen  Level  Result Value Ref Range   Acetaminophen  Level <10 10 - 30 ug/mL  Salicylate Level  Result Value Ref Range   Salicylate <3 (L) 6 - 30 mg/dL  eGFR (CKD-EPI)  Result Value Ref Range   eGFR >60 mL/min/1.26m2  Urine Drug Screen Comment  Result Value Ref Range   Drug Screen Comment See Text   Urinalysis, Point of Care  Result Value Ref Range   Urine Color, Point of Care Yellow Lt Yellow or Yellow   Urine Clarity, Point of Care Clear Clear   Urine Glucose, Point of Care Negative  Negative   Urine Bilirubin, Point of Care Negative Negative   Urine Ketones, Point of Care Negative Negative   Urine Specific Gravity, Point of Care 1.010 1.003 - 1.035   Urine Blood, Point of Care Negative Negative   Urine pH, Point of Care 5.5 5.0 - 8.0   Urine Protein, Point of Care 1+ (A) Negative   Urine Urobilinogen, Point of Care 0.2 0.2 - 1.0   Urine Nitrite, Point of Care Negative Negative   Urine Leukocyte Esterase, Point of Care Negative Negative   Imaging Results   None    ECG Results          ECG 12 lead; (Final result)  Result time 09/02/24 23:33:47    Final result             Impression:   Normal sinus rhythm Nonspecific T wave abnormality Abnormal ECG When compared with ECG of 20-Aug-2024 19:55, No significant change was found Confirmed by VINITA KRABBE [phone]) on 09/02/2024 11:33:47 PM          Narrative:   Ventricular Rate: 79 Atrial Rate: 79 P-R Interval: 190 QRS Duration: 98 Q-T Interval: 384 QTC Calculation(Bazett): 440 P Axis: 67 R Axis: 49 T Axis: 16                          Text in this note was generated using an ambient documentation service.  I discussed the potential to use a recording device to record and summarize our discussion today.  All persons present during the encounter consented to its use.    Norleen Deward Mingle, PA-C 09/03/24 0046       [1] Allergies Allergen Reactions  . Lisinopril Swelling    Reported on 5/14  by him to his my chart account : was in ED for swollen lips    . Sulfa (Sulfonamide Antibiotics) Itching and Rash  . Sulfamethoxazole-Trimethoprim Swelling and Rash  . Venom-Honey Bee Anaphylaxis  [2] Past Medical History: Diagnosis Date  . CHF (congestive heart failure) (CMS/HCC v28)   . Depression   . HIV (human immunodeficiency virus infection) (CMS/HCC v28)   . Hypertension   . Pulmonary embolism   . Substance abuse (CMS/HCC v28)   . Unspecified mood (affective) disorder  10/31/2022  . Urinary retention   [3] Past Surgical History: Procedure Laterality Date  . HERNIA REPAIR    [4] History reviewed. No pertinent family history.  Norleen Deward Mingle, PA-C 09/03/24 0201

## 2024-09-12 NOTE — ED Notes (Signed)
 Pt belongings stored in locker 7. Valuables given to security

## 2024-09-12 NOTE — ED Notes (Signed)
 Patient refused an IV - performed straight stick for lab collection

## 2024-09-13 NOTE — ED Notes (Signed)
 Pt A&Ox4, pt refused discharge vital signs and AVS, ambulatory at the time of discharge. Pt received medications stored in pharmacy. Pt has no other questions at this time.

## 2024-09-13 NOTE — ED Notes (Signed)
 Patient had concerns including Psychiatric Evaluation.  Sleeping in the bed with no signs of distress. Vitals:   09/12/24 2353  BP: 104/59  Pulse: 86  Resp: 14  Temp: 36.6 C (97.9 F)    Past Medical History:  Diagnosis Date  . CHF (congestive heart failure) (CMS/HHS-HCC)   . HIV (human immunodeficiency virus infection) (CMS/HHS-HCC)   . Homelessness   . PCP (pneumocystis carinii pneumonia) (CMS/HHS-HCC)   . Polysubstance abuse (CMS-HCC)   . Shock, cardiogenic (CMS/HHS-HCC)    Allergies  Allergen Reactions  . Bee Sting Kit Anaphylaxis  . Zestril [Lisinopril] Swelling    Reported on 5/14 by him to his my chart account : was in ED for swollen lips   . Sulfa (Sulfonamide Antibiotics) Itching and Rash    Tolerated Bactrim 06/16/24  . Other Other (See Comments)    all seasons, seasonal allergies    Lines: Peripheral IV 09/13/24 Right;Anterior Hand (0)

## 2024-09-13 NOTE — ED Provider Notes (Signed)
 7:01 AM Patient signed out to me by Dr. Terryl please see his note for details.  He presented last night with alcohol intoxication reporting that he wanted to kill himself.  Mr. Comer has history of drinking alcohol and making suicidal statements only to recant when he sobers up.  I have asked to reevaluate him as his sensorium cleared and he became sober.  On reevaluation Mr. Germond is interactive pleasant smiling and adamantly denies any suicidal ideation.  He denies any HI auditory visual examinations.  He is clinically sober ambulating with no ataxia.  He is eating and drinking without difficulty.  I feel comfortable discharging him home at this time following up with his primary care physician   Agatucci, Dempsey Redbird, MD 09/13/24 [phone]

## 2024-09-20 ENCOUNTER — Ambulatory Visit (HOSPITAL_COMMUNITY)
Admission: EM | Admit: 2024-09-20 | Discharge: 2024-09-21 | Disposition: A | Payer: MEDICAID | Attending: Psychiatry | Admitting: Psychiatry

## 2024-09-20 DIAGNOSIS — F10129 Alcohol abuse with intoxication, unspecified: Secondary | ICD-10-CM

## 2024-09-20 DIAGNOSIS — Z91148 Patient's other noncompliance with medication regimen for other reason: Secondary | ICD-10-CM

## 2024-09-20 DIAGNOSIS — R45851 Suicidal ideations: Secondary | ICD-10-CM

## 2024-09-20 DIAGNOSIS — F1090 Alcohol use, unspecified, uncomplicated: Secondary | ICD-10-CM

## 2024-09-20 DIAGNOSIS — F1994 Other psychoactive substance use, unspecified with psychoactive substance-induced mood disorder: Secondary | ICD-10-CM

## 2024-09-20 LAB — COMPREHENSIVE METABOLIC PANEL WITH GFR
ALT: 25 U/L (ref 0–44)
AST: 34 U/L (ref 15–41)
Albumin: 3.6 g/dL (ref 3.5–5.0)
Alkaline Phosphatase: 45 U/L (ref 38–126)
Anion gap: 9 (ref 5–15)
BUN: 21 mg/dL — ABNORMAL HIGH (ref 6–20)
CO2: 25 mmol/L (ref 22–32)
Calcium: 8.9 mg/dL (ref 8.9–10.3)
Chloride: 103 mmol/L (ref 98–111)
Creatinine, Ser: 1.21 mg/dL (ref 0.61–1.24)
GFR, Estimated: >60 mL/min (ref >60)
Glucose, Bld: 75 mg/dL (ref 70–99)
Potassium: 4 mmol/L (ref 3.5–5.1)
Sodium: 137 mmol/L (ref 135–145)
Total Bilirubin: 0.6 mg/dL (ref 0.0–1.2)
Total Protein: 8.2 g/dL — ABNORMAL HIGH (ref 6.5–8.1)

## 2024-09-20 LAB — CBC WITH DIFFERENTIAL/PLATELET
Abs Immature Granulocytes: 0.03 K/uL (ref 0.00–0.07)
Basophils Absolute: 0.1 K/uL (ref 0.0–0.1)
Basophils Relative: 1 %
Eosinophils Absolute: 0.5 K/uL (ref 0.0–0.5)
Eosinophils Relative: 7 %
HCT: 42.7 % (ref 39.0–52.0)
Hemoglobin: 14.1 g/dL (ref 13.0–17.0)
Immature Granulocytes: 0 %
Lymphocytes Relative: 38 %
Lymphs Abs: 3.1 K/uL (ref 0.7–4.0)
MCH: 32.7 pg (ref 26.0–34.0)
MCHC: 33 g/dL (ref 30.0–36.0)
MCV: 99.1 fL (ref 80.0–100.0)
Monocytes Absolute: 0.5 K/uL (ref 0.1–1.0)
Monocytes Relative: 7 %
Neutro Abs: 3.8 K/uL (ref 1.7–7.7)
Neutrophils Relative %: 47 %
Platelets: 405 K/uL — ABNORMAL HIGH (ref 150–400)
RBC: 4.31 MIL/uL (ref 4.22–5.81)
RDW: 13.3 % (ref 11.5–15.5)
WBC: 8.1 K/uL (ref 4.0–10.5)
nRBC: 0 % (ref 0.0–0.2)

## 2024-09-20 LAB — ETHANOL: Alcohol, Ethyl (B): 257 mg/dL — ABNORMAL HIGH (ref <15)

## 2024-09-20 MED ORDER — LORAZEPAM 2 MG/ML IJ SOLN: 2.0000 mg | Freq: Three times a day (TID) | INTRAMUSCULAR | Status: DC | PRN

## 2024-09-20 MED ORDER — HYDROXYZINE HCL 25 MG PO TABS: 25.0000 mg | ORAL_TABLET | Freq: Four times a day (QID) | ORAL | Status: DC | PRN

## 2024-09-20 MED ORDER — HALOPERIDOL LACTATE 5 MG/ML IJ SOLN: 5.0000 mg | Freq: Three times a day (TID) | INTRAMUSCULAR | Status: DC | PRN

## 2024-09-20 MED ORDER — LOPERAMIDE HCL 2 MG PO CAPS: 2.0000 mg | ORAL_CAPSULE | ORAL | Status: DC | PRN

## 2024-09-20 MED ORDER — HALOPERIDOL LACTATE 5 MG/ML IJ SOLN: 10.0000 mg | Freq: Three times a day (TID) | INTRAMUSCULAR | Status: DC | PRN

## 2024-09-20 MED ORDER — CHLORDIAZEPOXIDE HCL 25 MG PO CAPS: 25.0000 mg | ORAL_CAPSULE | ORAL | Status: DC

## 2024-09-20 MED ORDER — DIPHENHYDRAMINE HCL 50 MG/ML IJ SOLN: 50.0000 mg | Freq: Three times a day (TID) | INTRAMUSCULAR | Status: DC | PRN

## 2024-09-20 MED ORDER — CHLORDIAZEPOXIDE HCL 25 MG PO CAPS: 25.0000 mg | ORAL_CAPSULE | Freq: Four times a day (QID) | ORAL | Status: DC

## 2024-09-20 MED ORDER — DIPHENHYDRAMINE HCL 50 MG PO CAPS: 50.0000 mg | ORAL_CAPSULE | Freq: Three times a day (TID) | ORAL | Status: DC | PRN

## 2024-09-20 MED ORDER — CHLORDIAZEPOXIDE HCL 25 MG PO CAPS: 25.0000 mg | ORAL_CAPSULE | Freq: Four times a day (QID) | ORAL | Status: DC | PRN

## 2024-09-20 MED ORDER — ACETAMINOPHEN 325 MG PO TABS: 650.0000 mg | ORAL_TABLET | Freq: Four times a day (QID) | ORAL | Status: DC | PRN

## 2024-09-20 MED ORDER — BICTEGRAVIR-EMTRICITAB-TENOFOV 50-200-25 MG PO TABS: 1.0000 | ORAL_TABLET | Freq: Every day | ORAL | Status: DC

## 2024-09-20 MED ORDER — ADULT MULTIVITAMIN W/MINERALS CH: 1.0000 | ORAL_TABLET | Freq: Every day | ORAL | Status: DC

## 2024-09-20 MED ORDER — THIAMINE HCL 100 MG/ML IJ SOLN: 100.0000 mg | Freq: Once | INTRAMUSCULAR | Status: DC

## 2024-09-20 MED ORDER — HALOPERIDOL 5 MG PO TABS: 5.0000 mg | ORAL_TABLET | Freq: Three times a day (TID) | ORAL | Status: DC | PRN

## 2024-09-20 MED ORDER — CHLORDIAZEPOXIDE HCL 25 MG PO CAPS: 25.0000 mg | ORAL_CAPSULE | Freq: Every day | ORAL | Status: DC

## 2024-09-20 MED ORDER — CHLORDIAZEPOXIDE HCL 25 MG PO CAPS: 25.0000 mg | ORAL_CAPSULE | Freq: Three times a day (TID) | ORAL | Status: DC

## 2024-09-20 MED ORDER — ONDANSETRON 4 MG PO TBDP: 4.0000 mg | ORAL_TABLET | Freq: Four times a day (QID) | ORAL | Status: DC | PRN

## 2024-09-20 MED ORDER — MAGNESIUM HYDROXIDE 400 MG/5ML PO SUSP: 30.0000 mL | Freq: Every day | ORAL | Status: DC | PRN

## 2024-09-20 MED ORDER — ALUM & MAG HYDROXIDE-SIMETH 200-200-20 MG/5ML PO SUSP: 30.0000 mL | ORAL | Status: DC | PRN

## 2024-09-20 NOTE — ED Provider Notes (Addendum)
 [facility] Continuous Assessment Admission H&P  Date: 09/20/24 Patient Name: William Powers MRN: [id number] Chief Complaint: I need to sit down somewhere and detox.   Diagnoses:  Final diagnoses:  Alcohol abuse with intoxication  Alcohol use disorder  Suicidal ideation  Substance induced mood disorder (HCC)  Non compliance w medication regimen    HPI: William Powers is 43 y.o. male with medical problems significant for HIV/AIDS (CD4 <35, ~0%, VL 580,000 copies/mL 05/30/2024) complicated by OIs including pneumocystis pneumonia, prior thrush, HF, HTN, PE and polysubstance use disorder (alcohol and cocaine, who presented voluntarily as a walk in to [facility], with complaints of SI, no plan and requesting substance abuse tx/alcohol detox.  Patient was seen face to face by this provider and chart reviewed.  Per chart review, patient recently presented to [facility] ED with alcohol intoxication reporting that he wanted to kill himself . Provider notes report Mr. Mula has history of drinking alcohol and making suicidal statements only to recant when he sobers up.  On approach, patient is laying down across two seats, visibly intoxicated. Speech is slurred.  He reports  I feel like I'm gonna kill myself tonight, I will do it.........I need to sit down somewhere and detox.   He reports drinking daily........SABRAa long time, and going for detox treatment in [place] three years ago. .   Patient reports he ingested  Liquor...SABRASABRASABRAplenty.......vodka.......tonight.  He is unable to explain in details and does not answer questions directly.   He endorses access to weapons......guns. He reports living alone in a private residence.  Patient reports he is not established with an outpatient psychiatrist or therapist and currently does not take any medications.  Unable to continue with evaluation due to patient's current presentation (inattentive and intoxicated).   On evaluation, patient  is alert, oriented x 1 (name), and cooperative. Speech is slurred. Pt appears casually dressed. Eye contact is fleeting. Mood is euthymic, affect is congruent with mood. Thought process is goal directed and thought content is WDL. Pt endorses SI, denies HI/AVH. There is no objective indication that the patient is responding to internal stimuli. No delusions elicited during this assessment.    Discussed recommendation for admission to the continuous observation unit for safety monitoring overnight and re-eval in the a.m when sober. Patient may be appropriate for [facility]. Per chart review, when patient sobers up, he typically requests discharge after initial presentation with complaints of SI due to alcohol intoxication.   Total Time spent with patient: 30 minutes  Musculoskeletal  Strength & Muscle Tone: within normal limits Gait & Station: normal Patient leans: N/A  Psychiatric Specialty Exam  Presentation General Appearance:  Casual  Eye Contact: Fleeting  Speech: Slurred  Speech Volume: Increased  Handedness: Right   Mood and Affect  Mood: Euthymic  Affect: Congruent   Thought Process  Thought Processes: Goal Directed  Descriptions of Associations:Loose  Orientation:Partial  Thought Content:Rumination  Diagnosis of Schizophrenia or Schizoaffective disorder in past: No data recorded  Hallucinations:Hallucinations: None  Ideas of Reference:None  Suicidal Thoughts:Suicidal Thoughts: Yes, Passive SI Passive Intent and/or Plan: Without Plan  Homicidal Thoughts:Homicidal Thoughts: No   Sensorium  Memory: Immediate Poor  Judgment: Impaired  Insight: Poor   Executive Functions  Concentration: Poor  Attention Span: Poor  Recall: Poor  Fund of Knowledge: Poor  Language: Poor   Psychomotor Activity  Psychomotor Activity: Psychomotor Activity: Mannerisms   Assets  Assets: Desire for Improvement   Sleep  Sleep: Sleep:  Fair  Nutritional Assessment (For OBS and FBC admissions only) Has the patient had a weight loss or gain of 10 pounds or more in the last 3 months?: No Has the patient had a decrease in food intake/or appetite?: No Does the patient have dental problems?: No Does the patient have eating habits or behaviors that may be indicators of an eating disorder including binging or inducing vomiting?: No Has the patient recently lost weight without trying?: 0 Has the patient been eating poorly because of a decreased appetite?: 0 Malnutrition Screening Tool Score: 0    Physical Exam Constitutional:      General: He is not in acute distress.    Appearance: He is not diaphoretic.  Cardiovascular:     Rate and Rhythm: Normal rate.  Pulmonary:     Effort: No respiratory distress.  Chest:     Chest wall: No tenderness.  Neurological:     Mental Status: He is disoriented.  Psychiatric:        Attention and Perception: He is inattentive.        Mood and Affect: Affect is inappropriate.        Speech: Speech is slurred.        Behavior: Behavior is cooperative.        Thought Content: Thought content includes suicidal ideation.    Review of Systems  Constitutional:  Negative for chills, diaphoresis and fever.  Eyes:  Negative for discharge.  Respiratory:  Negative for cough, shortness of breath and wheezing.   Cardiovascular:  Negative for chest pain and palpitations.  Gastrointestinal:  Negative for diarrhea, nausea and vomiting.  Neurological:  Negative for dizziness, seizures, weakness and headaches.  Psychiatric/Behavioral:  Positive for substance abuse and suicidal ideas.     Blood pressure 104/60, pulse 98, temperature 98.2 F (36.8 C), temperature source Oral, resp. rate 20, SpO2 99%. There is no height or weight on file to calculate BMI.  Past Psychiatric History: See H & P   Is the patient at risk to self? Yes  Has the patient been a risk to self in the past 6 months? UTD.     Has the patient been a risk to self within the distant past? Yes   Is the patient a risk to others? No   Has the patient been a risk to others in the past 6 months? No   Has the patient been a risk to others within the distant past? No   Past Medical History: See Chart  Family History: N/A  Social History: N/A  Last Labs:  Admission on 09/20/2024  Component Date Value Ref Range Status   WBC 09/20/2024 8.1  4.0 - 10.5 K/uL Final   RBC 09/20/2024 4.31  4.22 - 5.81 MIL/uL Final   Hemoglobin 09/20/2024 14.1  13.0 - 17.0 g/dL Final   HCT 87/97/7974 42.7  39.0 - 52.0 % Final   MCV 09/20/2024 99.1  80.0 - 100.0 fL Final   MCH 09/20/2024 32.7  26.0 - 34.0 pg Final   MCHC 09/20/2024 33.0  30.0 - 36.0 g/dL Final   RDW 87/97/7974 13.3  11.5 - 15.5 % Final   Platelets 09/20/2024 405 (H)  150 - 400 K/uL Final   nRBC 09/20/2024 0.0  0.0 - 0.2 % Final   Neutrophils Relative % 09/20/2024 47  % Final   Neutro Abs 09/20/2024 3.8  1.7 - 7.7 K/uL Final   Lymphocytes Relative 09/20/2024 38  % Final   Lymphs Abs 09/20/2024 3.1  0.7 -  4.0 K/uL Final   Monocytes Relative 09/20/2024 7  % Final   Monocytes Absolute 09/20/2024 0.5  0.1 - 1.0 K/uL Final   Eosinophils Relative 09/20/2024 7  % Final   Eosinophils Absolute 09/20/2024 0.5  0.0 - 0.5 K/uL Final   Basophils Relative 09/20/2024 1  % Final   Basophils Absolute 09/20/2024 0.1  0.0 - 0.1 K/uL Final   Immature Granulocytes 09/20/2024 0  % Final   Abs Immature Granulocytes 09/20/2024 0.03  0.00 - 0.07 K/uL Final   Performed at [facility], [address] [place], [place] [address]    Allergies: Bee venom, Zestril [lisinopril], and Bactrim [sulfamethoxazole-trimethoprim]  Medications:  Facility Ordered Medications  Medication   acetaminophen  (TYLENOL ) tablet 650 mg   alum & mag hydroxide-simeth (MAALOX/MYLANTA) 200-200-20 MG/5ML suspension 30 mL   magnesium  hydroxide (MILK OF MAGNESIA) suspension 30 mL   thiamine  (VITAMIN B1)  injection 100 mg   [START ON 09/21/2024] multivitamin with minerals tablet 1 tablet   chlordiazePOXIDE (LIBRIUM) capsule 25 mg   hydrOXYzine  (ATARAX ) tablet 25 mg   loperamide  (IMODIUM ) capsule 2-4 mg   ondansetron  (ZOFRAN -ODT) disintegrating tablet 4 mg   haloperidol  (HALDOL ) tablet 5 mg   And   diphenhydrAMINE  (BENADRYL ) capsule 50 mg   haloperidol  lactate (HALDOL ) injection 5 mg   And   diphenhydrAMINE  (BENADRYL ) injection 50 mg   And   LORazepam  (ATIVAN ) injection 2 mg   haloperidol  lactate (HALDOL ) injection 10 mg   And   diphenhydrAMINE  (BENADRYL ) injection 50 mg   And   LORazepam  (ATIVAN ) injection 2 mg   chlordiazePOXIDE (LIBRIUM) capsule 25 mg   Followed by   NOREEN ON 09/22/2024] chlordiazePOXIDE (LIBRIUM) capsule 25 mg   Followed by   NOREEN ON 09/23/2024] chlordiazePOXIDE (LIBRIUM) capsule 25 mg   Followed by   NOREEN ON 09/24/2024] chlordiazePOXIDE (LIBRIUM) capsule 25 mg   [START ON 09/21/2024] bictegravir-emtricitabine -tenofovir  AF (BIKTARVY ) 50-200-25 MG per tablet 1 tablet   PTA Medications  Medication Sig   hydrOXYzine  (ATARAX ) 25 MG tablet Take 1 tablet (25 mg total) by mouth every 6 (six) hours as needed (anxiety/agitation or CIWA < or = 10). (Patient not taking: Reported on 05/06/2023)      Medical Decision Making  Recommend admission to the continuous observation unit for safety monitoring overnight and re-veal in the am when sober. Patient may be appropriate for the [facility].  Recommend CIWA Protocol-AUD  Lab Orders         CBC with Differential/Platelet         Comprehensive metabolic panel         Hemoglobin A1c         Ethanol         POCT Urine Drug Screen - (I-Screen)      EKG  Home medications continued -Biktarvy  1 tablet Po daily for HIV disease  Other Prns -Tylenol , Maalox. MOM -Agitation protocol medications   Recommendations  Based on my evaluation the patient does not appear to have an emergency medical condition.  Recommend admission  to the continuous observation unit for safety monitoring overnight and re-veal in the am. Patient may be appropriate for the [facility] Recommend CIWA Protocol  Thurman LULLA Ivans, NP 09/20/24  11:42 PM

## 2024-09-20 NOTE — BH Assessment (Signed)
 Comprehensive Clinical Assessment (CCA) Note  09/21/2024 William Powers [id number]  Disposition: Richerd Ivans, NP recommends pt to be admitted to [facility] for Observation.   The patient demonstrates the following risk factors for suicide: Chronic risk factors for suicide include: substance use disorder. Acute risk factors for suicide include: Pt reports, he's suicidal. Protective factors for this patient include: None. Considering these factors, the overall suicide risk at this point appears to be UTA due to intoxication. Patient is not appropriate for outpatient follow up.  William Powers is a 43 year old male who presents voluntary and unaccompanied to [facility] ([facility]). Pt reports,  I want to kill myself, if you don't do it then I'll do it. Pt reports, he has guns at his home where he lives alone. Per chart pt is homeless. Pt denies, Hi, hallucinations, self-injurious behaviors.  Pt reports, drinking plenty liquor tonight. Pt denies, being linked to linked to OPT resources (medication management and/or counseling.) Pt  has previous inpatient admissions.  Pt was a poor historian and presents intoxicated with slurred speech and poor eye contact. Pt's mood was euthymic. Pt's affect was congruent. Pt's insight was shallow. Pt's judgement impaired.    Chief Complaint:  Chief Complaint  Patient presents with   Alcohol Intoxication   suicidal ideation   Visit Diagnosis:  Alcohol abuse with intoxication.                               Alcohol use Disorder, severe.    CCA Screening, Triage and Referral (STR)  Patient Reported Information How did you hear about us ? Self  What Is the Reason for Your Visit/Call Today? Pt presents to [facility] as a voluntary walk-in, unaccompanied requesting substance abuse treatment/detox and SI. Pt reports drinking a gallon of vodka within the last 24 hours. Pt is intoxicated and then stated that he only had a few  shots. Pt story changes multiple times. Pt endorses SI, but unclear if he has plan or intent. Pt is observed laughing uncontrollably, unable to focus and answer questions as posed. Pt was unable to complete triage process due to current condition.  How Long Has This Been Causing You Problems? <Week  What Do You Feel Would Help You the Most Today? Alcohol or Drug Use Treatment; Treatment for Depression or other mood problem   Have You Recently Had Any Thoughts About Hurting Yourself? Yes  Are You Planning to Commit Suicide/Harm Yourself At This time? -- (UTA)   Flowsheet Row ED from 05/06/2023 in [facility] Emergency Department at [facility] Most recent reading at 05/06/2023  2:30 AM Admission (Discharged) from 05/01/2023 in [facility] INPATIENT BEHAVIORAL MEDICINE Most recent reading at 05/01/2023 10:54 AM ED from 05/01/2023 in [facility] Emergency Department at [facility] Most recent reading at 05/01/2023 12:33 AM  C-SSRS RISK CATEGORY High Risk No Risk High Risk    Have you Recently Had Thoughts About Hurting Someone Else? -- (UTA)  Are You Planning to Harm Someone at This Time? -- (UTA)  Explanation: NA   Have You Used Any Alcohol or Drugs in the Past 24 Hours? Yes  How Long Ago Did You Use Drugs or Alcohol? Tonight (09/20/2024). What Did You Use and How Much? gallon of vodka   Do You Currently Have a Therapist/Psychiatrist? No  Name of Therapist/Psychiatrist:    Have You Been Recently Discharged From Any Office Practice or Programs?  No  Explanation of Discharge From Practice/Program: NA    CCA Screening Triage Referral Assessment Type of Contact: Face-to-Face  Telemedicine Service Delivery:   Is this Initial or Reassessment?   Date Telepsych consult ordered in [facility]:    Time Telepsych consult ordered in [facility]:    Location of Assessment: [facility] Assessment Services  Provider Location: [facility] Assessment Services   Collateral Involvement: NA   Does Patient  Have a Automotive Engineer Guardian? No  Legal Guardian Contact Information: NA  Copy of Legal Guardianship Form: -- (NA)  Legal Guardian Notified of Arrival: -- (NA)  Legal Guardian Notified of Pending Discharge: -- (NA)  If Minor and Not Living with Parent(s), Who has Custody? NA  Is CPS involved or ever been involved? -- (UTA due to intoxication.)  Is APS involved or ever been involved? -- (UTA due to intoxication.)   Patient Determined To Be At Risk for Harm To Self or Others Based on Review of Patient Reported Information or Presenting Complaint? Yes, for Self-Harm  Method: No Plan  Availability of Means: Has close by  Intent: Vague intent or NA  Notification Required: No need or identified person  Additional Information for Danger to Others Potential: -- (NA)  Additional Comments for Danger to Others Potential: NA  Are There Guns or Other Weapons in Your Home? Yes  Types of Guns/Weapons: Pt reports, he has a gun at his home.  Are These Weapons Safely Secured?                            -- (UTA due to intoxication.)  Who Could Verify You Are Able To Have These Secured: UTA due to intoxication.  Do You Have any Outstanding Charges, Pending Court Dates, Parole/Probation? UTA due to intoxication.  Contacted To Inform of Risk of Harm To Self or Others: Other: Comment (UTA due to intoxication.)    Does Patient Present under Involuntary Commitment? No    [place] of Residence: [place]   Patient Currently Receiving the Following Services: Not Receiving Services   Determination of Need: Urgent (48 hours)   Options For Referral: Other: Comment; Chemical Dependency Intensive Outpatient Therapy (CDIOP); Facility-Based Crisis     CCA Biopsychosocial Patient Reported Schizophrenia/Schizoaffective Diagnosis in Past: No   Strengths: UTA due to intoxication.   Mental Health Symptoms Depression:  Hopelessness   Duration of Depressive symptoms: Duration of  Depressive Symptoms: N/A   Mania:  -- (UTA due to intoxication.)   Anxiety:   -- (UTA due to intoxication.)   Psychosis:  -- (UTA due to intoxication.)   Duration of Psychotic symptoms:    Trauma:  -- (UTA due to intoxication.)   Obsessions:  -- (UTA due to intoxication.)   Compulsions:  -- (UTA due to intoxication.)   Inattention:  -- (UTA due to intoxication.)   Hyperactivity/Impulsivity:  -- (UTA due to intoxication.)   Oppositional/Defiant Behaviors:  -- (UTA due to intoxication.)   Emotional Irregularity:  Recurrent suicidal behaviors/gestures/threats   Other Mood/Personality Symptoms:  UTA due to intoxication.    Mental Status Exam Appearance and self-care  Stature:  Tall   Weight:  Average weight   Clothing:  Casual (In scrubs)   Grooming:  Neglected   Cosmetic use:  None   Posture/gait:  Other (Comment) (Pt sitting with his back on the wall.)   Motor activity:  Restless; Repetitive   Sensorium  Attention:  Inattentive   Concentration:  Preoccupied   Orientation:  Person; Object   Recall/memory:  Normal   Affect and Mood  Affect:  Congruent   Mood:  Euthymic   Relating  Eye contact:  Normal   Facial expression:  Tense   Attitude toward examiner:  Cooperative   Thought and Language  Speech flow: Slurred   Thought content:  Appropriate to Mood and Circumstances   Preoccupation:  None   Hallucinations:  None   Organization:  Disorganized   Company Secretary of Knowledge:  Poor   Intelligence:  Average   Abstraction:  Functional   Judgement:  Impaired   Reality Testing:  Distorted   Insight:  Shallow   Decision Making:  Impulsive   Social Functioning  Social Maturity:  -- (UTA due to intoxication.)   Social Judgement:  Chief Of Staff   Stress  Stressors:  Other (Comment) (UTA due to intoxication.)   Coping Ability:  Overwhelmed; Deficient supports   Skill Deficits:  Decision making; Responsibility;  Communication; Self-control; Self-care   Supports:  Support needed     Religion: Religion/Spirituality Are You A Religious Person?:  (UTA due to intoxication.) How Might This Affect Treatment?: NA  Leisure/Recreation: Leisure / Recreation Do You Have Hobbies?:  (UTA due to intoxication.)  Exercise/Diet: Exercise/Diet Do You Exercise?:  (UTA due to intoxication.) Have You Gained or Lost A Significant Amount of Weight in the Past Six Months?:  (UTA due to intoxication.) Do You Follow a Special Diet?:  (UTA due to intoxication.) Do You Have Any Trouble Sleeping?:  (UTA due to intoxication.)   CCA Employment/Education Employment/Work Situation: Employment / Work Situation Employment Situation: Unemployed Patient's Job has Been Impacted by Current Illness:  (UTA due to intoxication.) Has Patient ever Been in the [employer]?:  (UTA due to intoxication.)  Education: Education Is Patient Currently Attending School?:  (UTA due to intoxication.) Last Grade Completed:  (UTA due to intoxication.) Did You Attend College?:  (UTA due to intoxication.) Did You Have An Individualized Education Program (IIEP):  (UTA due to intoxication.) Did You Have Any Difficulty At School?:  (UTA due to intoxication.) Patient's Education Has Been Impacted by Current Illness:  (UTA due to intoxication.)   CCA Family/Childhood History Family and Relationship History: Family history Marital status: Divorced Divorced, when?: UTA due to intoxication. What types of issues is patient dealing with in the relationship?: UTA due to intoxication. Additional relationship information: UTA due to intoxication. Does patient have children?: No  Childhood History:  Childhood History By whom was/is the patient raised?: Other (Comment) Did patient suffer any verbal/emotional/physical/sexual abuse as a child?: No Did patient suffer from severe childhood neglect?: No Has patient ever been sexually  abused/assaulted/raped as an adolescent or adult?: No Was the patient ever a victim of a crime or a disaster?: No Witnessed domestic violence?: No Has patient been affected by domestic violence as an adult?: No   CCA Substance Use Alcohol/Drug Use: Alcohol / Drug Use Pain Medications: See MAR Prescriptions: See MAR Over the Counter: See MAR History of alcohol / drug use?: Yes Longest period of sobriety (when/how long): 1 year Negative Consequences of Use:  (UTA due to intoxication.) Withdrawal Symptoms: Other (Comment) (UTA due to intoxication.) Substance #1 Name of Substance 1: Alcohol. 1 - Age of First Use: A long time. 1 - Amount (size/oz): Pt reports, he drank plenty liquor 1 - Frequency: Everyday. 1 - Duration: Ongoing. 1 - Last Use / Amount: Tonight (09/20/2024). 1 - Method of Aquiring: UTA due  to intoxication. 1- Route of Use: Oral.    ASAM's:  Six Dimensions of Multidimensional Assessment  Dimension 1:  Acute Intoxication and/or Withdrawal Potential:   Dimension 1:  Description of individual's past and current experiences of substance use and withdrawal: UTA due to intoxication.  Dimension 2:  Biomedical Conditions and Complications:   Dimension 2:  Description of patient's biomedical conditions and  complications: UTA due to intoxication.  Dimension 3:  Emotional, Behavioral, or Cognitive Conditions and Complications:     Dimension 4:  Readiness to Change:     Dimension 5:  Relapse, Continued use, or Continued Problem Potential:     Dimension 6:  Recovery/Living Environment:     ASAM Severity Score:    ASAM Recommended Level of Treatment:     Substance use Disorder (SUD)    Recommendations for Services/Supports/Treatments: Recommendations for Services/Supports/Treatments Recommendations For Services/Supports/Treatments: Other (Comment) (Pt to be admitted to [facility] for Observation.)  Disposition Recommendation per psychiatric provider: Pt to be admitted to  [facility] for Observation.    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/2023   Dental caries 09/13/2022   Substance abuse (HCC) 05/03/2022   Polysubstance abuse (HCC) 04/29/2022   Alcohol use disorder, severe, dependence (HCC) 03/16/2022   Recurrent major depressive episodes, moderate (HCC) 02/23/2022   Alcohol abuse    Suicidal ideation    Vitamin B1 deficiency 09/19/2021   Adjustment disorder with depressed mood    History of CHF (congestive heart failure) 08/07/2021   H/O ETOH abuse 08/07/2021   Chronic HFrEF (heart failure with reduced ejection fraction) (HCC) 07/23/2021   HIV (human immunodeficiency virus infection) (HCC) 07/23/2021   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   Tinea versicolor 09/17/2018   History of tobacco abuse 09/16/2018   Dilated cardiomyopathy (HCC) 09/13/2018     Referrals to Alternative Service(s): Referred to Alternative Service(s):   Place:   Date:   Time:    Referred to Alternative Service(s):   Place:   Date:   Time:    Referred to Alternative Service(s):   Place:   Date:   Time:    Referred to Alternative Service(s):   Place:   Date:   Time:     Jackson JONETTA Broach, [facility] Comprehensive Clinical Assessment (CCA) Screening, Triage and Referral Note  09/21/2024 Tagg Eustice [id number]  Chief Complaint:  Chief Complaint  Patient presents with   Alcohol Intoxication   suicidal ideation   Visit Diagnosis:   Patient Reported Information How did you hear about us ? Self  What Is the Reason for Your Visit/Call Today? Pt presents to [facility] as a voluntary walk-in, unaccompanied requesting substance abuse treatment/detox and SI. Pt reports drinking a gallon of vodka within the last 24 hours. Pt is intoxicated and then stated that he only had a few shots. Pt story changes multiple  times. Pt endorses SI, but unclear if he has plan or intent. Pt is observed laughing uncontrollably, unable to focus and answer questions as posed. Pt was unable to complete triage process due to current condition.  How Long Has This Been Causing You Problems? <Week  What Do You Feel Would Help You the Most Today? Alcohol or Drug Use Treatment; Treatment for Depression or other mood problem   Have You Recently Had Any Thoughts About Hurting Yourself? Yes  Are You Planning to Commit Suicide/Harm Yourself  At This time? -- (UTA)   Have you Recently Had Thoughts About Hurting Someone Else? -- (UTA)  Are You Planning to Harm Someone at This Time? -- (UTA)  Explanation: NA   Have You Used Any Alcohol or Drugs in the Past 24 Hours? Yes  How Long Ago Did You Use Drugs or Alcohol? Tonight (09/20/2024). What Did You Use and How Much? gallon of vodka   Do You Currently Have a Therapist/Psychiatrist? No  Name of Therapist/Psychiatrist: Pt denies.  Have You Been Recently Discharged From Any Office Practice or Programs? No  Explanation of Discharge From Practice/Program: NA   CCA Screening Triage Referral Assessment Type of Contact: Face-to-Face  Telemedicine Service Delivery:   Is this Initial or Reassessment?   Date Telepsych consult ordered in [facility]:    Time Telepsych consult ordered in [facility]:    Location of Assessment: [facility] Assessment Services  Provider Location: [facility] Assessment Services    Collateral Involvement: NA   Does Patient Have a Automotive Engineer Guardian? No. Name and Contact of Legal Guardian: NA If Minor and Not Living with Parent(s), Who has Custody? NA  Is CPS involved or ever been involved? -- (UTA due to intoxication.)  Is APS involved or ever been involved? -- (UTA due to intoxication.)   Patient Determined To Be At Risk for Harm To Self or Others Based on Review of Patient Reported Information or Presenting Complaint? Yes, for Self-Harm  Method:  No Plan  Availability of Means: Has close by  Intent: Vague intent or NA  Notification Required: No need or identified person  Additional Information for Danger to Others Potential: -- (NA)  Additional Comments for Danger to Others Potential: NA  Are There Guns or Other Weapons in Your Home? Yes  Types of Guns/Weapons: Pt reports, he has a gun at his home.  Are These Weapons Safely Secured?                            -- (UTA due to intoxication.)  Who Could Verify You Are Able To Have These Secured: UTA due to intoxication.  Do You Have any Outstanding Charges, Pending Court Dates, Parole/Probation? UTA due to intoxication.  Contacted To Inform of Risk of Harm To Self or Others: Other: Comment (UTA due to intoxication.)   Does Patient Present under Involuntary Commitment? No    [place] of Residence: [place]   Patient Currently Receiving the Following Services: Not Receiving Services   Determination of Need: Urgent (48 hours)   Options For Referral: Other: Comment; Chemical Dependency Intensive Outpatient Therapy (CDIOP); Facility-Based Crisis   Disposition Recommendation per psychiatric provider: Pt to be admitted to [facility] for Observation.   Jackson JONETTA Broach, LCMHC     Larell Baney D Yichen Gilardi, MS, [facility], [facility] Triage Specialist [phone]

## 2024-09-20 NOTE — Progress Notes (Signed)
   09/20/24 2202  [facility] Triage Screening (Walk-ins at [facility] only)  What Is the Reason for Your Visit/Call Today? Pt presents to [facility] as a voluntary walk-in, unaccompanied requesting substance abuse treatment/detox and SI. Pt reports drinking a gallon of vodka within the last 24 hours. Pt is intoxicated and then stated that he only had a few shots. Pt story changes multiple times. Pt endorses SI, but unclear if he has plan or intent. Pt is observed laughing uncontrollably, unable to focus and answer questions as posed. Pt was unable to complete triage process due to current condition.  How Long Has This Been Causing You Problems? <Week  Have You Recently Had Any Thoughts About Hurting Yourself? Yes  How long ago did you have thoughts about hurting yourself? currently  Are You Planning to Commit Suicide/Harm Yourself At This time?  (UTA)  Have you Recently Had Thoughts About Hurting Someone Else?  (UTA)  Are You Planning To Harm Someone At This Time?  (UTA)  Physical Abuse  (UTA)  Verbal Abuse  (UtA)  Sexual Abuse  (UtA)  Exploitation of patient/patient's resources  (uta)  Self-Neglect  (uta)  Are you currently experiencing any auditory, visual or other hallucinations?  (uta)  Have You Used Any Alcohol or Drugs in the Past 24 Hours? Yes  What Did You Use and How Much? gallon of vodka  Do you have any current medical co-morbidities that require immediate attention? No  Clinician description of patient physical appearance/behavior: intoxicated, laughing uncontrollably, but pleasant/cooperative  What Do You Feel Would Help You the Most Today? Alcohol or Drug Use Treatment;Treatment for Depression or other mood problem  If access to [facility] Urgent Care was not available, would you have sought care in the Emergency Department?  (UTA)  Determination of Need Urgent (48 hours)  Options For Referral Other: Comment;Chemical Dependency Intensive Outpatient Therapy (CDIOP);Facility-Based Crisis   Determination of Need filed? Yes

## 2024-09-21 ENCOUNTER — Other Ambulatory Visit: Payer: Self-pay

## 2024-09-21 LAB — HEMOGLOBIN A1C
Hgb A1c MFr Bld: 5 % (ref 4.8–5.6)
Mean Plasma Glucose: 97 mg/dL

## 2024-09-21 MED ORDER — BICTEGRAVIR-EMTRICITAB-TENOFOV 50-200-25 MG PO TABS: 1.0000 | ORAL_TABLET | Freq: Every day | ORAL | 3 refills | Status: AC

## 2024-09-21 MED ORDER — BICTEGRAVIR-EMTRICITAB-TENOFOV 50-200-25 MG PO TABS: 1.0000 | ORAL_TABLET | Freq: Every day | ORAL | 3 refills | Status: DC

## 2024-09-21 NOTE — ED Notes (Signed)
 Patient ate breakfast and presents irritable demanding to discharge. Patient currently denies SI,HI, and A/V/H with no plan or intent. Patient remains safe on unit. No s/s of current distress.

## 2024-09-21 NOTE — ED Notes (Signed)
 Pt is currently sleeping, no distress noted, environmental check complete, will continue to monitor patient for safety.

## 2024-09-21 NOTE — ED Notes (Signed)
 This nurse gave patient breakfast, patient denies S/I, H/I, AVH. However patient states he would like to see the doctor as he is ready to go! This nurse made Dr. Delsie aware.

## 2024-09-21 NOTE — ED Provider Notes (Signed)
 [facility]BC/OBS ASAP Discharge Summary  Date and Time: 09/21/2024 8:14 AM  Name: William Powers  MRN:  [id number]   Discharge Diagnoses:  Final diagnoses:  Alcohol abuse with intoxication  Alcohol use disorder  Suicidal ideation  Substance induced mood disorder (HCC)  Non compliance w medication regimen    Subjective: William Powers is a 43 yo male with pph of alcohol use disorder, severe, substance induced mood disorder, and frequent suicidal ideation in the setting of intoxication and pmh significant for HIV, HFrEF, HSV-2 infection, PJP Pneumonia.  This morning he was seen bedside with the medical student. He is AxOx4. He reports that he is no longer intoxicated and feels ready to leave. He denies an interest in further psychiatric evaluation or substance use treatment. He denies hallucinations, delusions, he denies suicidal ideation, denies homicidal ideation. He denies symptoms concerning for an acute exacerbation of any of his medical conditions.  Stay Summary: Patient was admitted 12/2 for acute alcohol intoxication with suicidal ideation  Total Time spent with patient: 15 minutes  Past Psychiatric History: Polysubstance abuse, alcohol use disorder, substance-induced mood disorder, frequent rule out secondary gain Family History: Unwilling to participate Family Psychiatric History: Unwilling to participate Social History: Patient reports that he will be leaving to a private residence Tobacco Cessation:  A prescription for an FDA-approved tobacco cessation medication was offered at discharge and the patient refused  Current Medications:  Current Facility-Administered Medications  Medication Dose Route Frequency Provider Last Rate Last Admin   acetaminophen  (TYLENOL ) tablet 650 mg  650 mg Oral Q6H PRN Onuoha, Chinwendu V, NP       alum & mag hydroxide-simeth (MAALOX/MYLANTA) 200-200-20 MG/5ML suspension 30 mL  30 mL Oral Q4H PRN Onuoha, Chinwendu V, NP        bictegravir-emtricitabine -tenofovir  AF (BIKTARVY ) 50-200-25 MG per tablet 1 tablet  1 tablet Oral Daily Onuoha, Chinwendu V, NP       chlordiazePOXIDE (LIBRIUM) capsule 25 mg  25 mg Oral Q6H PRN Onuoha, Chinwendu V, NP       chlordiazePOXIDE (LIBRIUM) capsule 25 mg  25 mg Oral QID Onuoha, Chinwendu V, NP       Followed by   NOREEN ON 09/22/2024] chlordiazePOXIDE (LIBRIUM) capsule 25 mg  25 mg Oral TID Onuoha, Chinwendu V, NP       Followed by   NOREEN ON 09/23/2024] chlordiazePOXIDE (LIBRIUM) capsule 25 mg  25 mg Oral BH-qamhs Onuoha, Chinwendu V, NP       Followed by   NOREEN ON 09/24/2024] chlordiazePOXIDE (LIBRIUM) capsule 25 mg  25 mg Oral Daily Onuoha, Chinwendu V, NP       haloperidol  (HALDOL ) tablet 5 mg  5 mg Oral TID PRN Onuoha, Chinwendu V, NP       And   diphenhydrAMINE  (BENADRYL ) capsule 50 mg  50 mg Oral TID PRN Onuoha, Chinwendu V, NP       haloperidol  lactate (HALDOL ) injection 5 mg  5 mg Intramuscular TID PRN Onuoha, Chinwendu V, NP       And   diphenhydrAMINE  (BENADRYL ) injection 50 mg  50 mg Intramuscular TID PRN Onuoha, Chinwendu V, NP       And   LORazepam  (ATIVAN ) injection 2 mg  2 mg Intramuscular TID PRN Onuoha, Chinwendu V, NP       haloperidol  lactate (HALDOL ) injection 10 mg  10 mg Intramuscular TID PRN Onuoha, Chinwendu V, NP       And   diphenhydrAMINE  (BENADRYL ) injection 50 mg  50 mg Intramuscular  TID PRN Onuoha, Chinwendu V, NP       And   LORazepam  (ATIVAN ) injection 2 mg  2 mg Intramuscular TID PRN Onuoha, Chinwendu V, NP       hydrOXYzine  (ATARAX ) tablet 25 mg  25 mg Oral Q6H PRN Onuoha, Chinwendu V, NP       loperamide  (IMODIUM ) capsule 2-4 mg  2-4 mg Oral PRN Onuoha, Chinwendu V, NP       magnesium  hydroxide (MILK OF MAGNESIA) suspension 30 mL  30 mL Oral Daily PRN Onuoha, Chinwendu V, NP       multivitamin with minerals tablet 1 tablet  1 tablet Oral Daily Onuoha, Chinwendu V, NP       ondansetron  (ZOFRAN -ODT) disintegrating tablet 4 mg  4 mg Oral Q6H PRN  Onuoha, Chinwendu V, NP       thiamine  (VITAMIN B1) injection 100 mg  100 mg Intramuscular Once Onuoha, Chinwendu V, NP       Current Outpatient Medications  Medication Sig Dispense Refill   hydrOXYzine  (ATARAX ) 25 MG tablet Take 1 tablet (25 mg total) by mouth every 6 (six) hours as needed (anxiety/agitation or CIWA < or = 10). (Patient not taking: Reported on 05/06/2023) 30 tablet 0    PTA Medications:  Facility Ordered Medications  Medication   acetaminophen  (TYLENOL ) tablet 650 mg   alum & mag hydroxide-simeth (MAALOX/MYLANTA) 200-200-20 MG/5ML suspension 30 mL   magnesium  hydroxide (MILK OF MAGNESIA) suspension 30 mL   thiamine  (VITAMIN B1) injection 100 mg   multivitamin with minerals tablet 1 tablet   chlordiazePOXIDE (LIBRIUM) capsule 25 mg   hydrOXYzine  (ATARAX ) tablet 25 mg   loperamide  (IMODIUM ) capsule 2-4 mg   ondansetron  (ZOFRAN -ODT) disintegrating tablet 4 mg   haloperidol  (HALDOL ) tablet 5 mg   And   diphenhydrAMINE  (BENADRYL ) capsule 50 mg   haloperidol  lactate (HALDOL ) injection 5 mg   And   diphenhydrAMINE  (BENADRYL ) injection 50 mg   And   LORazepam  (ATIVAN ) injection 2 mg   haloperidol  lactate (HALDOL ) injection 10 mg   And   diphenhydrAMINE  (BENADRYL ) injection 50 mg   And   LORazepam  (ATIVAN ) injection 2 mg   chlordiazePOXIDE (LIBRIUM) capsule 25 mg   Followed by   NOREEN ON 09/22/2024] chlordiazePOXIDE (LIBRIUM) capsule 25 mg   Followed by   NOREEN ON 09/23/2024] chlordiazePOXIDE (LIBRIUM) capsule 25 mg   Followed by   NOREEN ON 09/24/2024] chlordiazePOXIDE (LIBRIUM) capsule 25 mg   bictegravir-emtricitabine -tenofovir  AF (BIKTARVY ) 50-200-25 MG per tablet 1 tablet   PTA Medications  Medication Sig   hydrOXYzine  (ATARAX ) 25 MG tablet Take 1 tablet (25 mg total) by mouth every 6 (six) hours as needed (anxiety/agitation or CIWA < or = 10). (Patient not taking: Reported on 05/06/2023)       01/31/2023    7:01 AM 01/27/2023   12:51 AM 05/03/2022   12:30 PM   Depression screen PHQ 2/9  Decreased Interest 1 1 1   Down, Depressed, Hopeless 1 1 2   PHQ - 2 Score 2 2 3   Altered sleeping 1 1 2   Tired, decreased energy 1 1 1   Change in appetite 1  0  Feeling bad or failure about yourself  1 1 2   Trouble concentrating  1 0  Moving slowly or fidgety/restless 1 0 0  Suicidal thoughts 1 1 2   PHQ-9 Score 8  7  10    Difficult doing work/chores Somewhat difficult Somewhat difficult Somewhat difficult     Data saved with a previous flowsheet row  definition    Flowsheet Row ED from 09/20/2024 in [facility] ED from 05/06/2023 in [facility] Department at [facility] Admission (Discharged) from 05/01/2023 in [facility]PATIENT [facility]AL MEDICINE  C-SSRS RISK CATEGORY Low Risk High Risk No Risk    Musculoskeletal  Strength & Muscle Tone: within normal limits Gait & Station: normal Patient leans: N/A  Psychiatric Specialty Exam  Mental Status Exam:  Appearance: African-American male of average build, numerous facial tattoos, unkempt and malodorous  Behavior: Agitated uncooperative, scratching beard poor eye contact  Attitude: Guarded, uncooperative  Speech: Regular rate rhythm volume  Mood: Ready to go  Affect: Blunted, irritable  Thought Process: Linear and goal oriented  Thought Content: Denies paranoia delusions  SI/HI: Denies all  Perceptions:denies, not seen responding  Judgment: Lacking  Insight: Lacking  Fund of Knowledge: Poor     Nutritional Assessment (For OBS and FBC admissions only) Has the patient had a weight loss or gain of 10 pounds or more in the last 3 months?: No Has the patient had a decrease in food intake/or appetite?: No Does the patient have dental problems?: No Does the patient have eating habits or behaviors that may be indicators of an eating disorder including binging or inducing vomiting?: No Has the patient recently lost weight without trying?: 0 Has the patient  been eating poorly because of a decreased appetite?: 0 Malnutrition Screening Tool Score: 0    Physical Exam  Physical Exam Vitals and nursing note reviewed.  Constitutional:      General: He is not in acute distress.    Appearance: He is well-developed.  HENT:     Head: Normocephalic and atraumatic.  Eyes:     Conjunctiva/sclera: Conjunctivae normal.  Cardiovascular:     Rate and Rhythm: Normal rate and regular rhythm.     Heart sounds: No murmur heard. Pulmonary:     Effort: Pulmonary effort is normal. No respiratory distress.     Breath sounds: Normal breath sounds.  Abdominal:     Palpations: Abdomen is soft.     Tenderness: There is no abdominal tenderness.  Musculoskeletal:        General: No swelling.     Cervical back: Neck supple.  Skin:    General: Skin is warm and dry.     Capillary Refill: Capillary refill takes less than 2 seconds.  Neurological:     Mental Status: He is alert.  Psychiatric:        Attention and Perception: Attention and perception normal.        Mood and Affect: Mood is anxious. Affect is angry.        Speech: Speech normal.        Behavior: Behavior is uncooperative and agitated.        Thought Content: Thought content normal. Thought content does not include homicidal or suicidal ideation. Thought content does not include homicidal or suicidal plan.        Cognition and Memory: Cognition and memory normal.        Judgment: Judgment is impulsive.    Review of Systems  Respiratory:  Negative for cough and shortness of breath.   Cardiovascular:  Negative for chest pain.  Gastrointestinal:  Negative for constipation, diarrhea, nausea and vomiting.  Psychiatric/Behavioral:  Positive for substance abuse.    Blood pressure 118/68, pulse 88, temperature 98.2 F (36.8 C), temperature source Oral, resp. rate 20, SpO2 99%. There is no height or weight on file to  calculate BMI.  Suicide Risk Assessment:  Suicidal ideation/thoughts:   []   Current         [x]  Recent         []  Denies        []  Remote   []  Chronic  Intention to act or plan:        []  Current     []  Recent [x]  Denies  []  Remote  Preparatory behavior:  []  Recent    [x]  Denies Recent      []  Remote Instance  Suicide attempts:  []  Immediately prior to this admission     []  During this admission    []  Recent    []  Multiple   [x]  Remote    []  Denies Ever     Risk Factors  Protective Factors  Acute  Easy access to highly lethal means, Escalating substance use, Anger/rage, Recent impulsivity or acting recklessly, Worsening medical condition, and Acute intoxication AcuteSuicideProtectiveFactors: Denies current SI or Intent, No recent suicide attempts, No recent self-harm behavior, No command AH encouraging suicide, and Executive function intact  Chronic Previous suicide attempt, Major psychiatric disorder, Pattern of impulsivity/recklessness, Single, Separated, or Divorced, Chronic unemployment, Chronic homelessness, Lack of social support, and Barriers to accessing healthcare No previous self-harm behaviors, Age (25-59), Willingness to seek help, and Achievable life goals/ambitions   Potential future factors that may impact risk: FutureSuicideFactors : Chronic/terminal illness of self  Summary: While it is impossible to accurately predict with absolute certainty future events and human behaviors, an assessment of current suicidal indicators, risk factors, and protective factors suggests that this patient's:   Acute suicide risk is: mild in degree.    Chronic suicide risk is: moderate in degree.   Suicide risk increases with substance/alcohol use and acute intoxication.   Disposition: Discharged with bus pass to residence  Lynwood Morene Lavone Delsie, MD 09/21/2024, 8:14 AM

## 2024-09-21 NOTE — Discharge Instructions (Signed)
 Local Inpatient Options for Substance Abuse Treatment    Includes links to the following:  Short-Term: [facility] (TechCelebrity.com.pt) [facility] (https://ringer[facility]ers.com/) [facility] (http://wilson-mayo.com/)  Medium Term: [facility] (DyeCasts.nl) [facility] (Medicaid or Uninsured only) (https://www[facility].org/locations/[facility])  Longer Residential Options: [facility] children[facility] (MediaSuits.se) [facility] ([facility]) - [facility] (https://baker.biz/)  ____ Outpatient Therapy and Psychiatry Resources for Patients: Your psychiatric needs would be well-served by consultation and regular meetings with an outpatient therapist to assist you with your mood-related conditions. Here are a series of links for finding a therapist.    Includes links to the following: [facility] (http://wilson-mayo.com/) (only for [facility] and please reserve for uninsured) [facility] (http://blankenship-martinez.net/) [employer] Special educational needs teacher (https://www.psychologytoday.com/us lendell) [employer] Support Group Tax inspector (https://www.psychologytoday.com/us /groups/) [employer] - [employer] Location (https://carolinabehavioralcare.com/staff-location/[place]htanum/) [facility] [place] - Support Group Finder - (RecordDebt.fi) [facility] [employer] - Lexicographer  (https://fspcares.org/contact/) [employer] [facility] [place] - NAMI (https://namiguilford.org/support-and-education/support-groups/) Interior and spatial designer Health - Affiliated with [facility] (https://www.[place].com/lb/locations/profile/[facility]-[address]/) Dept of Health and Human Services - Find a mental health facility (http://lester.info/)
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
## 2024-09-21 NOTE — ED Notes (Signed)
 Pt A&O x 4, very sleepy, presents with passive SI no plan noted and ETOH.  Comfort measures given.no distress noted.  Monitoring for safety.

## 2024-09-21 NOTE — ED Notes (Signed)
 Patient did not want to give vitals this morning, he got upset because he was awoken, also the nurse gave him juice this morning but he wanted more, and used profanity when asked what he wanted for breakfast, he just said  I want to see the provider so I can get out of this Bitch.  He insisted on more juice.
